# Patient Record
Sex: Female | Born: 1945
Health system: Southern US, Community
[De-identification: ages and names within clinical notes are randomized; demographics above are authoritative.]

## PROBLEM LIST (undated history)

## (undated) DIAGNOSIS — M545 Low back pain, unspecified: Secondary | ICD-10-CM

## (undated) DIAGNOSIS — K219 Gastro-esophageal reflux disease without esophagitis: Secondary | ICD-10-CM

## (undated) DIAGNOSIS — I5189 Other ill-defined heart diseases: Secondary | ICD-10-CM

## (undated) DIAGNOSIS — M1990 Unspecified osteoarthritis, unspecified site: Secondary | ICD-10-CM

## (undated) DIAGNOSIS — E039 Hypothyroidism, unspecified: Secondary | ICD-10-CM

## (undated) DIAGNOSIS — N390 Urinary tract infection, site not specified: Secondary | ICD-10-CM

## (undated) DIAGNOSIS — G8929 Other chronic pain: Secondary | ICD-10-CM

## (undated) DIAGNOSIS — F419 Anxiety disorder, unspecified: Secondary | ICD-10-CM

## (undated) DIAGNOSIS — I10 Essential (primary) hypertension: Secondary | ICD-10-CM

## (undated) DIAGNOSIS — M719 Bursopathy, unspecified: Secondary | ICD-10-CM

## (undated) DIAGNOSIS — Z8639 Personal history of other endocrine, nutritional and metabolic disease: Secondary | ICD-10-CM

## (undated) DIAGNOSIS — E785 Hyperlipidemia, unspecified: Secondary | ICD-10-CM

## (undated) DIAGNOSIS — I739 Peripheral vascular disease, unspecified: Secondary | ICD-10-CM

## (undated) DIAGNOSIS — L4050 Arthropathic psoriasis, unspecified: Secondary | ICD-10-CM

## (undated) DIAGNOSIS — N159 Renal tubulo-interstitial disease, unspecified: Secondary | ICD-10-CM

## (undated) DIAGNOSIS — M797 Fibromyalgia: Secondary | ICD-10-CM

## (undated) DIAGNOSIS — D649 Anemia, unspecified: Secondary | ICD-10-CM

## (undated) DIAGNOSIS — K589 Irritable bowel syndrome without diarrhea: Secondary | ICD-10-CM

## (undated) DIAGNOSIS — H9193 Unspecified hearing loss, bilateral: Secondary | ICD-10-CM

## (undated) DIAGNOSIS — Z9889 Other specified postprocedural states: Secondary | ICD-10-CM

## (undated) DIAGNOSIS — E538 Deficiency of other specified B group vitamins: Secondary | ICD-10-CM

## (undated) HISTORY — DX: Deficiency of other specified B group vitamins: E53.8

## (undated) HISTORY — PX: CHOLECYSTECTOMY: SHX55

## (undated) HISTORY — DX: Bursopathy, unspecified: M71.9

## (undated) HISTORY — DX: Hyperlipidemia, unspecified: E78.5

## (undated) HISTORY — DX: Gastro-esophageal reflux disease without esophagitis: K21.9

## (undated) HISTORY — DX: Low back pain: M54.5

## (undated) HISTORY — DX: Anxiety disorder, unspecified: F41.9

## (undated) HISTORY — DX: Essential (primary) hypertension: I10

## (undated) HISTORY — DX: Other specified postprocedural states: Z98.890

## (undated) HISTORY — DX: Low back pain, unspecified: M54.50

## (undated) HISTORY — DX: Peripheral vascular disease, unspecified: I73.9

## (undated) HISTORY — DX: Anemia, unspecified: D64.9

## (undated) HISTORY — PX: OTHER SURGICAL HISTORY: SHX169

## (undated) HISTORY — DX: Renal tubulo-interstitial disease, unspecified: N15.9

## (undated) HISTORY — DX: Hypothyroidism, unspecified: E03.9

## (undated) HISTORY — DX: Urinary tract infection, site not specified: N39.0

## (undated) HISTORY — DX: Other chronic pain: G89.29

## (undated) HISTORY — DX: Unspecified osteoarthritis, unspecified site: M19.90

## (undated) HISTORY — DX: Arthropathic psoriasis, unspecified: L40.50

## (undated) HISTORY — DX: Irritable bowel syndrome, unspecified: K58.9

---

## 1996-04-08 HISTORY — PX: NECK SURGERY: SHX720

## 1997-11-24 ENCOUNTER — Emergency Department (HOSPITAL_COMMUNITY): Admission: EM | Admit: 1997-11-24 | Discharge: 1997-11-24 | Payer: Self-pay | Admitting: Emergency Medicine

## 1997-12-13 ENCOUNTER — Ambulatory Visit (HOSPITAL_COMMUNITY): Admission: RE | Admit: 1997-12-13 | Discharge: 1997-12-13 | Payer: Self-pay | Admitting: Gastroenterology

## 1998-11-16 ENCOUNTER — Encounter: Payer: Self-pay | Admitting: *Deleted

## 1998-11-16 ENCOUNTER — Ambulatory Visit (HOSPITAL_COMMUNITY): Admission: RE | Admit: 1998-11-16 | Discharge: 1998-11-16 | Payer: Self-pay | Admitting: *Deleted

## 1999-09-13 ENCOUNTER — Other Ambulatory Visit: Admission: RE | Admit: 1999-09-13 | Discharge: 1999-09-13 | Payer: Self-pay | Admitting: Obstetrics and Gynecology

## 2000-09-08 ENCOUNTER — Encounter: Payer: Self-pay | Admitting: General Surgery

## 2000-09-10 ENCOUNTER — Encounter (INDEPENDENT_AMBULATORY_CARE_PROVIDER_SITE_OTHER): Payer: Self-pay | Admitting: Specialist

## 2000-09-10 ENCOUNTER — Observation Stay (HOSPITAL_COMMUNITY): Admission: RE | Admit: 2000-09-10 | Discharge: 2000-09-11 | Payer: Self-pay | Admitting: General Surgery

## 2000-10-30 ENCOUNTER — Other Ambulatory Visit: Admission: RE | Admit: 2000-10-30 | Discharge: 2000-10-30 | Payer: Self-pay | Admitting: Obstetrics and Gynecology

## 2001-07-07 ENCOUNTER — Encounter: Payer: Self-pay | Admitting: Gastroenterology

## 2001-07-07 ENCOUNTER — Ambulatory Visit (HOSPITAL_COMMUNITY): Admission: RE | Admit: 2001-07-07 | Discharge: 2001-07-07 | Payer: Self-pay | Admitting: Gastroenterology

## 2001-08-05 ENCOUNTER — Ambulatory Visit (HOSPITAL_COMMUNITY): Admission: RE | Admit: 2001-08-05 | Discharge: 2001-08-05 | Payer: Self-pay | Admitting: Oncology

## 2001-08-05 ENCOUNTER — Encounter: Payer: Self-pay | Admitting: Oncology

## 2001-09-30 ENCOUNTER — Encounter: Admission: RE | Admit: 2001-09-30 | Discharge: 2001-09-30 | Payer: Self-pay | Admitting: Family Medicine

## 2001-09-30 ENCOUNTER — Encounter: Payer: Self-pay | Admitting: Family Medicine

## 2001-11-18 ENCOUNTER — Other Ambulatory Visit: Admission: RE | Admit: 2001-11-18 | Discharge: 2001-11-18 | Payer: Self-pay | Admitting: *Deleted

## 2002-03-10 ENCOUNTER — Ambulatory Visit (HOSPITAL_COMMUNITY): Admission: RE | Admit: 2002-03-10 | Discharge: 2002-03-10 | Payer: Self-pay | Admitting: Cardiovascular Disease

## 2002-03-10 ENCOUNTER — Encounter: Payer: Self-pay | Admitting: Cardiovascular Disease

## 2002-03-17 ENCOUNTER — Encounter: Payer: Self-pay | Admitting: Rheumatology

## 2002-03-17 ENCOUNTER — Encounter: Admission: RE | Admit: 2002-03-17 | Discharge: 2002-03-17 | Payer: Self-pay | Admitting: Rheumatology

## 2002-05-19 ENCOUNTER — Ambulatory Visit (HOSPITAL_COMMUNITY): Admission: RE | Admit: 2002-05-19 | Discharge: 2002-05-19 | Payer: Self-pay | Admitting: Gastroenterology

## 2002-09-21 ENCOUNTER — Encounter: Payer: Self-pay | Admitting: Gastroenterology

## 2002-09-21 ENCOUNTER — Ambulatory Visit (HOSPITAL_COMMUNITY): Admission: RE | Admit: 2002-09-21 | Discharge: 2002-09-21 | Payer: Self-pay | Admitting: Gastroenterology

## 2002-10-14 ENCOUNTER — Encounter: Admission: RE | Admit: 2002-10-14 | Discharge: 2002-11-03 | Payer: Self-pay | Admitting: Family Medicine

## 2003-07-05 ENCOUNTER — Observation Stay (HOSPITAL_COMMUNITY): Admission: AD | Admit: 2003-07-05 | Discharge: 2003-07-07 | Payer: Self-pay | Admitting: Family Medicine

## 2003-10-18 ENCOUNTER — Emergency Department (HOSPITAL_COMMUNITY): Admission: EM | Admit: 2003-10-18 | Discharge: 2003-10-18 | Payer: Self-pay | Admitting: Emergency Medicine

## 2004-04-08 LAB — HM MAMMOGRAPHY: HM Mammogram: NORMAL

## 2004-04-08 LAB — CONVERTED CEMR LAB: Pap Smear: NORMAL

## 2004-04-25 ENCOUNTER — Ambulatory Visit: Payer: Self-pay | Admitting: Family Medicine

## 2004-05-04 ENCOUNTER — Ambulatory Visit: Payer: Self-pay

## 2004-05-11 ENCOUNTER — Ambulatory Visit: Payer: Self-pay | Admitting: Critical Care Medicine

## 2004-05-14 ENCOUNTER — Ambulatory Visit: Payer: Self-pay

## 2004-06-05 ENCOUNTER — Ambulatory Visit: Payer: Self-pay | Admitting: Family Medicine

## 2004-06-29 ENCOUNTER — Ambulatory Visit: Payer: Self-pay | Admitting: Family Medicine

## 2004-07-16 ENCOUNTER — Ambulatory Visit: Payer: Self-pay | Admitting: Family Medicine

## 2004-07-19 ENCOUNTER — Ambulatory Visit (HOSPITAL_COMMUNITY): Admission: RE | Admit: 2004-07-19 | Discharge: 2004-07-19 | Payer: Self-pay | Admitting: Family Medicine

## 2004-11-23 ENCOUNTER — Ambulatory Visit: Payer: Self-pay | Admitting: Family Medicine

## 2005-11-15 ENCOUNTER — Ambulatory Visit: Payer: Self-pay | Admitting: Internal Medicine

## 2006-03-25 ENCOUNTER — Ambulatory Visit: Payer: Self-pay | Admitting: Internal Medicine

## 2006-03-25 LAB — CONVERTED CEMR LAB: TSH: 0.49 microintl units/mL (ref 0.35–5.50)

## 2006-04-13 ENCOUNTER — Encounter: Payer: Self-pay | Admitting: Internal Medicine

## 2006-12-04 ENCOUNTER — Telehealth (INDEPENDENT_AMBULATORY_CARE_PROVIDER_SITE_OTHER): Payer: Self-pay | Admitting: *Deleted

## 2006-12-04 ENCOUNTER — Encounter (INDEPENDENT_AMBULATORY_CARE_PROVIDER_SITE_OTHER): Payer: Self-pay | Admitting: *Deleted

## 2007-01-15 ENCOUNTER — Encounter (INDEPENDENT_AMBULATORY_CARE_PROVIDER_SITE_OTHER): Payer: Self-pay | Admitting: *Deleted

## 2007-01-27 ENCOUNTER — Ambulatory Visit: Payer: Self-pay | Admitting: Internal Medicine

## 2007-01-27 DIAGNOSIS — J989 Respiratory disorder, unspecified: Secondary | ICD-10-CM | POA: Insufficient documentation

## 2007-01-27 DIAGNOSIS — E079 Disorder of thyroid, unspecified: Secondary | ICD-10-CM | POA: Insufficient documentation

## 2007-01-27 DIAGNOSIS — M549 Dorsalgia, unspecified: Secondary | ICD-10-CM

## 2007-01-27 DIAGNOSIS — K589 Irritable bowel syndrome without diarrhea: Secondary | ICD-10-CM | POA: Insufficient documentation

## 2007-01-27 DIAGNOSIS — I10 Essential (primary) hypertension: Secondary | ICD-10-CM | POA: Insufficient documentation

## 2007-01-27 DIAGNOSIS — R0989 Other specified symptoms and signs involving the circulatory and respiratory systems: Secondary | ICD-10-CM

## 2007-01-27 DIAGNOSIS — K219 Gastro-esophageal reflux disease without esophagitis: Secondary | ICD-10-CM | POA: Insufficient documentation

## 2007-02-03 LAB — CONVERTED CEMR LAB
Folate: 8.8 ng/mL
Sed Rate: 34 mm/hr — ABNORMAL HIGH (ref 0–25)
TSH: 23.76 microintl units/mL — ABNORMAL HIGH (ref 0.35–5.50)
Vitamin B-12: 205 pg/mL — ABNORMAL LOW (ref 211–911)

## 2007-02-05 ENCOUNTER — Ambulatory Visit: Payer: Self-pay | Admitting: Internal Medicine

## 2007-02-13 ENCOUNTER — Ambulatory Visit: Payer: Self-pay | Admitting: Internal Medicine

## 2007-02-26 ENCOUNTER — Ambulatory Visit: Payer: Self-pay | Admitting: Internal Medicine

## 2007-03-02 ENCOUNTER — Telehealth (INDEPENDENT_AMBULATORY_CARE_PROVIDER_SITE_OTHER): Payer: Self-pay | Admitting: *Deleted

## 2007-03-09 ENCOUNTER — Telehealth (INDEPENDENT_AMBULATORY_CARE_PROVIDER_SITE_OTHER): Payer: Self-pay | Admitting: *Deleted

## 2007-03-12 ENCOUNTER — Ambulatory Visit: Payer: Self-pay | Admitting: Internal Medicine

## 2007-04-15 ENCOUNTER — Ambulatory Visit: Payer: Self-pay | Admitting: Internal Medicine

## 2007-04-15 DIAGNOSIS — R002 Palpitations: Secondary | ICD-10-CM | POA: Insufficient documentation

## 2007-04-15 LAB — CONVERTED CEMR LAB
Bilirubin Urine: NEGATIVE
Blood in Urine, dipstick: NEGATIVE
Glucose, Urine, Semiquant: NEGATIVE
Ketones, urine, test strip: NEGATIVE
Nitrite: NEGATIVE
Protein, U semiquant: NEGATIVE
Specific Gravity, Urine: >=1.03
Urobilinogen, UA: NEGATIVE
WBC Urine, dipstick: NEGATIVE
pH: 5

## 2007-04-20 LAB — CONVERTED CEMR LAB
ALT: 20 units/L (ref 0–35)
AST: 20 units/L (ref 0–37)
Albumin: 4.2 g/dL (ref 3.5–5.2)
Alkaline Phosphatase: 98 units/L (ref 39–117)
BUN: 13 mg/dL (ref 6–23)
Basophils Absolute: 0.2 10*3/uL — ABNORMAL HIGH (ref 0.0–0.1)
Basophils Relative: 1.6 % — ABNORMAL HIGH (ref 0.0–1.0)
Bilirubin, Direct: 0.1 mg/dL (ref 0.0–0.3)
CO2: 27 meq/L (ref 19–32)
Calcium: 9.6 mg/dL (ref 8.4–10.5)
Chloride: 104 meq/L (ref 96–112)
Cholesterol: 244 mg/dL (ref 0–200)
Creatinine, Ser: 1 mg/dL (ref 0.4–1.2)
Direct LDL: 169.8 mg/dL
Eosinophils Absolute: 0.4 10*3/uL (ref 0.0–0.6)
Eosinophils Relative: 3.1 % (ref 0.0–5.0)
Folate: 9.6 ng/mL
GFR calc Af Amer: 72 mL/min
GFR calc non Af Amer: 60 mL/min
Glucose, Bld: 112 mg/dL — ABNORMAL HIGH (ref 70–99)
HCT: 39.1 % (ref 36.0–46.0)
HDL: 34.5 mg/dL — ABNORMAL LOW (ref >39.0)
Hemoglobin: 13.4 g/dL (ref 12.0–15.0)
Lymphocytes Relative: 33.4 % (ref 12.0–46.0)
MCHC: 34.4 g/dL (ref 30.0–36.0)
MCV: 88 fL (ref 78.0–100.0)
Monocytes Absolute: 0.8 10*3/uL — ABNORMAL HIGH (ref 0.2–0.7)
Monocytes Relative: 6.9 % (ref 3.0–11.0)
Neutro Abs: 6.3 10*3/uL (ref 1.4–7.7)
Neutrophils Relative %: 55 % (ref 43.0–77.0)
Platelets: 422 10*3/uL — ABNORMAL HIGH (ref 150–400)
Potassium: 4.7 meq/L (ref 3.5–5.1)
RBC: 4.44 M/uL (ref 3.87–5.11)
RDW: 13.3 % (ref 11.5–14.6)
Sodium: 141 meq/L (ref 135–145)
TSH: 7.21 microintl units/mL — ABNORMAL HIGH (ref 0.35–5.50)
Total Bilirubin: 0.7 mg/dL (ref 0.3–1.2)
Total CHOL/HDL Ratio: 7.1
Total Protein: 7.5 g/dL (ref 6.0–8.3)
Triglycerides: 214 mg/dL (ref 0–149)
VLDL: 43 mg/dL — ABNORMAL HIGH (ref 0–40)
Vitamin B-12: >1500 pg/mL — ABNORMAL HIGH (ref 211–911)
WBC: 11.6 10*3/uL — ABNORMAL HIGH (ref 4.5–10.5)

## 2007-04-21 ENCOUNTER — Ambulatory Visit: Payer: Self-pay

## 2007-04-21 ENCOUNTER — Encounter: Payer: Self-pay | Admitting: Internal Medicine

## 2007-04-28 ENCOUNTER — Encounter (INDEPENDENT_AMBULATORY_CARE_PROVIDER_SITE_OTHER): Payer: Self-pay | Admitting: *Deleted

## 2007-06-01 ENCOUNTER — Telehealth (INDEPENDENT_AMBULATORY_CARE_PROVIDER_SITE_OTHER): Payer: Self-pay | Admitting: *Deleted

## 2007-10-20 ENCOUNTER — Observation Stay (HOSPITAL_COMMUNITY): Admission: EM | Admit: 2007-10-20 | Discharge: 2007-10-23 | Payer: Self-pay | Admitting: Emergency Medicine

## 2007-10-21 ENCOUNTER — Ambulatory Visit: Payer: Self-pay | Admitting: Internal Medicine

## 2007-10-21 ENCOUNTER — Ambulatory Visit: Payer: Self-pay | Admitting: Cardiology

## 2007-10-22 ENCOUNTER — Ambulatory Visit: Admission: RE | Admit: 2007-10-22 | Discharge: 2007-10-22 | Payer: Self-pay | Admitting: Internal Medicine

## 2007-11-30 ENCOUNTER — Ambulatory Visit: Payer: Self-pay | Admitting: Internal Medicine

## 2007-11-30 DIAGNOSIS — Z9889 Other specified postprocedural states: Secondary | ICD-10-CM

## 2007-11-30 DIAGNOSIS — R0602 Shortness of breath: Secondary | ICD-10-CM | POA: Insufficient documentation

## 2007-11-30 DIAGNOSIS — I2510 Atherosclerotic heart disease of native coronary artery without angina pectoris: Secondary | ICD-10-CM | POA: Insufficient documentation

## 2007-11-30 DIAGNOSIS — E785 Hyperlipidemia, unspecified: Secondary | ICD-10-CM | POA: Insufficient documentation

## 2007-11-30 HISTORY — DX: Other specified postprocedural states: Z98.890

## 2008-04-04 ENCOUNTER — Emergency Department (HOSPITAL_COMMUNITY): Admission: EM | Admit: 2008-04-04 | Discharge: 2008-04-04 | Payer: Self-pay | Admitting: Family Medicine

## 2008-09-06 ENCOUNTER — Encounter (INDEPENDENT_AMBULATORY_CARE_PROVIDER_SITE_OTHER): Payer: Self-pay | Admitting: *Deleted

## 2008-09-12 ENCOUNTER — Telehealth (INDEPENDENT_AMBULATORY_CARE_PROVIDER_SITE_OTHER): Payer: Self-pay | Admitting: *Deleted

## 2008-11-08 ENCOUNTER — Ambulatory Visit: Payer: Self-pay | Admitting: Internal Medicine

## 2008-11-08 LAB — CONVERTED CEMR LAB
Glucose, Urine, Semiquant: NEGATIVE
Nitrite: NEGATIVE
Protein, U semiquant: 100
Specific Gravity, Urine: 1.025
Urobilinogen, UA: 0.2
pH: 5

## 2008-11-09 ENCOUNTER — Ambulatory Visit: Payer: Self-pay | Admitting: Internal Medicine

## 2008-11-10 ENCOUNTER — Encounter: Payer: Self-pay | Admitting: Internal Medicine

## 2008-11-10 LAB — CONVERTED CEMR LAB

## 2008-11-11 ENCOUNTER — Telehealth (INDEPENDENT_AMBULATORY_CARE_PROVIDER_SITE_OTHER): Payer: Self-pay | Admitting: *Deleted

## 2008-11-21 ENCOUNTER — Telehealth (INDEPENDENT_AMBULATORY_CARE_PROVIDER_SITE_OTHER): Payer: Self-pay | Admitting: *Deleted

## 2008-11-21 LAB — CONVERTED CEMR LAB
ALT: 16 units/L (ref 0–35)
AST: 14 units/L (ref 0–37)
BUN: 14 mg/dL (ref 6–23)
CO2: 30 meq/L (ref 19–32)
Calcium: 9.1 mg/dL (ref 8.4–10.5)
Chloride: 110 meq/L (ref 96–112)
Cholesterol: 175 mg/dL (ref 0–200)
Creatinine, Ser: 0.9 mg/dL (ref 0.4–1.2)
GFR calc non Af Amer: 67.11 mL/min (ref >60)
Glucose, Bld: 111 mg/dL — ABNORMAL HIGH (ref 70–99)
HDL: 36.5 mg/dL — ABNORMAL LOW (ref >39.00)
LDL Cholesterol: 100 mg/dL — ABNORMAL HIGH (ref 0–99)
Potassium: 3.6 meq/L (ref 3.5–5.1)
Sodium: 144 meq/L (ref 135–145)
TSH: 0.61 microintl units/mL (ref 0.35–5.50)
Total CHOL/HDL Ratio: 5
Triglycerides: 194 mg/dL — ABNORMAL HIGH (ref 0.0–149.0)
VLDL: 38.8 mg/dL (ref 0.0–40.0)

## 2008-12-01 ENCOUNTER — Encounter (INDEPENDENT_AMBULATORY_CARE_PROVIDER_SITE_OTHER): Payer: Self-pay | Admitting: *Deleted

## 2009-07-12 ENCOUNTER — Telehealth (INDEPENDENT_AMBULATORY_CARE_PROVIDER_SITE_OTHER): Payer: Self-pay | Admitting: *Deleted

## 2009-07-18 ENCOUNTER — Encounter (INDEPENDENT_AMBULATORY_CARE_PROVIDER_SITE_OTHER): Payer: Self-pay | Admitting: *Deleted

## 2009-09-23 ENCOUNTER — Emergency Department (HOSPITAL_COMMUNITY): Admission: EM | Admit: 2009-09-23 | Discharge: 2009-09-23 | Payer: Self-pay | Admitting: Emergency Medicine

## 2009-10-19 ENCOUNTER — Ambulatory Visit: Payer: Self-pay | Admitting: Internal Medicine

## 2009-10-26 ENCOUNTER — Encounter (INDEPENDENT_AMBULATORY_CARE_PROVIDER_SITE_OTHER): Payer: Self-pay | Admitting: *Deleted

## 2009-11-15 ENCOUNTER — Encounter (INDEPENDENT_AMBULATORY_CARE_PROVIDER_SITE_OTHER): Payer: Self-pay | Admitting: *Deleted

## 2009-11-20 ENCOUNTER — Telehealth (INDEPENDENT_AMBULATORY_CARE_PROVIDER_SITE_OTHER): Payer: Self-pay | Admitting: *Deleted

## 2009-12-21 ENCOUNTER — Telehealth: Payer: Self-pay | Admitting: Internal Medicine

## 2010-02-08 ENCOUNTER — Telehealth (INDEPENDENT_AMBULATORY_CARE_PROVIDER_SITE_OTHER): Payer: Self-pay | Admitting: *Deleted

## 2010-02-14 ENCOUNTER — Encounter (INDEPENDENT_AMBULATORY_CARE_PROVIDER_SITE_OTHER): Payer: Self-pay | Admitting: *Deleted

## 2010-02-26 ENCOUNTER — Telehealth: Payer: Self-pay | Admitting: Internal Medicine

## 2010-03-15 ENCOUNTER — Encounter: Payer: Self-pay | Admitting: Internal Medicine

## 2010-03-15 ENCOUNTER — Ambulatory Visit: Payer: Self-pay | Admitting: Internal Medicine

## 2010-03-15 LAB — CONVERTED CEMR LAB
ALT: 13 units/L (ref 0–35)
AST: 17 units/L (ref 0–37)
Basophils Absolute: 0.1 10*3/uL (ref 0.0–0.1)
Basophils Relative: 0.5 % (ref 0.0–3.0)
Cholesterol: 185 mg/dL (ref 0–200)
Eosinophils Absolute: 0.4 10*3/uL (ref 0.0–0.7)
Eosinophils Relative: 3.2 % (ref 0.0–5.0)
HCT: 39.2 % (ref 36.0–46.0)
HDL: 35.4 mg/dL — ABNORMAL LOW (ref >39.00)
Hemoglobin: 12.8 g/dL (ref 12.0–15.0)
LDL Cholesterol: 112 mg/dL — ABNORMAL HIGH (ref 0–99)
Lymphocytes Relative: 41.1 % (ref 12.0–46.0)
Lymphs Abs: 5.3 10*3/uL — ABNORMAL HIGH (ref 0.7–4.0)
MCHC: 32.6 g/dL (ref 30.0–36.0)
MCV: 91.1 fL (ref 78.0–100.0)
Monocytes Absolute: 1 10*3/uL (ref 0.1–1.0)
Monocytes Relative: 7.9 % (ref 3.0–12.0)
Neutro Abs: 6.1 10*3/uL (ref 1.4–7.7)
Neutrophils Relative %: 47.3 % (ref 43.0–77.0)
Platelets: 449 10*3/uL — ABNORMAL HIGH (ref 150.0–400.0)
RBC: 4.31 M/uL (ref 3.87–5.11)
RDW: 16.4 % — ABNORMAL HIGH (ref 11.5–14.6)
TSH: 0.87 microintl units/mL (ref 0.35–5.50)
Total CHOL/HDL Ratio: 5
Triglycerides: 187 mg/dL — ABNORMAL HIGH (ref 0.0–149.0)
VLDL: 37.4 mg/dL (ref 0.0–40.0)
Vit D, 25-Hydroxy: 12 ng/mL — ABNORMAL LOW
Vitamin B-12: 48 pg/mL — ABNORMAL LOW
WBC: 12.8 10*3/uL — ABNORMAL HIGH (ref 4.5–10.5)

## 2010-03-19 ENCOUNTER — Telehealth: Payer: Self-pay | Admitting: Internal Medicine

## 2010-03-22 ENCOUNTER — Telehealth: Payer: Self-pay | Admitting: Internal Medicine

## 2010-03-22 ENCOUNTER — Encounter: Payer: Self-pay | Admitting: Internal Medicine

## 2010-03-23 ENCOUNTER — Telehealth: Payer: Self-pay | Admitting: Internal Medicine

## 2010-03-26 ENCOUNTER — Encounter (INDEPENDENT_AMBULATORY_CARE_PROVIDER_SITE_OTHER): Payer: Self-pay | Admitting: *Deleted

## 2010-03-26 ENCOUNTER — Encounter: Payer: Self-pay | Admitting: Internal Medicine

## 2010-03-27 ENCOUNTER — Emergency Department (HOSPITAL_COMMUNITY)
Admission: EM | Admit: 2010-03-27 | Discharge: 2010-03-28 | Payer: Self-pay | Source: Home / Self Care | Admitting: Emergency Medicine

## 2010-04-05 ENCOUNTER — Emergency Department (HOSPITAL_COMMUNITY)
Admission: EM | Admit: 2010-04-05 | Discharge: 2010-04-05 | Payer: Self-pay | Source: Home / Self Care | Admitting: Emergency Medicine

## 2010-04-28 ENCOUNTER — Encounter: Payer: Self-pay | Admitting: Family Medicine

## 2010-04-29 ENCOUNTER — Encounter: Payer: Self-pay | Admitting: Family Medicine

## 2010-05-02 ENCOUNTER — Ambulatory Visit: Admit: 2010-05-02 | Payer: Self-pay | Admitting: Internal Medicine

## 2010-05-02 ENCOUNTER — Telehealth: Payer: Self-pay | Admitting: Internal Medicine

## 2010-05-07 ENCOUNTER — Telehealth: Payer: Self-pay | Admitting: Internal Medicine

## 2010-05-08 NOTE — Progress Notes (Signed)
Summary: decrease simvastatin to 10 mg   Phone Note Outgoing Call   Summary of Call: please advise patient to take Zocor 20 mg only half tablet a day because she is on Cardizem and they may  interact. Otherwise followup here as planned [facility] E. Paz MD  November 20, 2009 8:46 AM   Follow-up for Phone Call        Tried calling pt, mailbox is full. Army Fossa CMA  November 20, 2009 9:29 AM  Tried calling pt, mailbox is full. Army Fossa CMA  November 20, 2009 1:33 PM  Tried calling pt, mailbox's are full.Harold Barban  November 21, 2009 10:13 AM   Additional Follow-up for Phone Call Additional follow up Details #1::        send a letter [facility] E. Paz MD  November 21, 2009 2:21 PM     Additional Follow-up for Phone Call Additional follow up Details #2::    Letter mailed.  Follow-up by: Harold Barban,  November 21, 2009 3:13 PM

## 2010-05-08 NOTE — Letter (Signed)
Summary: Primary Care Appointment Letter  [facility]  [address] [place], [place] [address]   Phone: [phone]  Fax: [phone]    07/18/2009 MRN: [id number]  Kayla [name] [address] [place], [place]  [address]  Dear [name],   Your Primary Care Physician Nolon Rod. Paz MD has indicated that:    __x_____it is time to schedule an appointment.  Please call our office @ [phone] to schedule an office visit with Dr. Drue Novel.    Thank you,    [facility] Primary Care Scheduler

## 2010-05-08 NOTE — Progress Notes (Signed)
Summary: lab results  ---- Converted from flag ---- ---- 11/10/2008 2:07 PM, Jose E. Paz MD wrote: can we add a hemoglobin A1c  unable to add -- no purple tube drawn lmom for pt to return call Shary Decamp  November 11, 2008 10:58 AM  patient left message on VM -- returning my call -- requested that I leave detailed message on her answering machine Left message on machine for pt to call & schedule additional lab work [facility]  November 14, 2008 5:05 PM

## 2010-05-08 NOTE — Assessment & Plan Note (Signed)
Summary: cpx//tl    Vital Signs:  Patient Profile:   65 Years Old Female Height:     65 inches Weight:      228.4 pounds BMI:     38.15 Pulse rate:   86 / minute BP sitting:   120 / 80  Vitals Entered By: Shary Decamp (April 15, 2007 9:34 AM)                 Chief Complaint:  cpx - fasting; c/o low back pain.  History of Present Illness: CPX  UTI?  LBP-chronic but in the last two days has gone "'up to the kidney area"  Current Allergies (reviewed today): * [employer]  Past Medical History:    Reviewed history from 01/27/2007 and no changes required:       Asthma       GERD       Hypertension       Hypothyroidism       chronic LBP       B12 def       IBS  Past Surgical History:    neck surgery 1998    Cholecystectomy   Family History:    MI-- father 50 y/o    colon ca--no    breast ca--no    DM--no  Social History:    Married    2 kids   Risk Factors:  Tobacco use:  never Alcohol use:  no  Mammogram History:     Date of Last Mammogram:  04/08/2004    Results:  normal (per pt)   PAP Smear History:     Date of Last PAP Smear:  04/08/2004    Results:  normal (per pt)    Review of Systems  General      other than below, ROS  is negative  no exercise or diet  CV      Denies swelling of feet.      occ palpitation at nigh: no CP, no SOB, no N-V, no diaphoresis. last few seconds  Resp      Denies cough.  GI      Denies bloody stools.      occ abd dyscomfort (h/o IBS  GU      Denies dysuria, hematuria, urinary frequency, and urinary hesitancy.  Psych      doing okay emotionally, "better than in long time, going to church" husband--ill, h/o ETOH   Physical Exam  General:     alert, well-developed, and overweight-appearing.   Neck:     no masses and no thyromegaly.   Lungs:     normal respiratory effort, no intercostal retractions, no accessory muscle use, and normal breath sounds.   Heart:     normal rate,  regular rhythm, no murmur, and no gallop.   Abdomen:     soft, non-tender, no hepatomegaly, and no splenomegaly.   Extremities:     no edema Psych:     Oriented X3, memory intact for recent and remote, normally interactive, good eye contact, not anxious appearing, and not depressed appearing.      Impression & Recommendations:  Problem # 1:  HEALTH SCREENING (ICD-V70.0) Female care per gyn H/o at least 2  Cscopes, last 2-04: Nl also had a EGD 2004: HH and a  esoph. striture labs diet!  (can't exercise much due to LBP) Orders: UA Dipstick w/o Micro (81002) Venipuncture (04540) TLB-Lipid Panel (80061-LIPID) TLB-B12 + Folate Pnl (98119_14782-N56/OZH) TLB-BMP (Basic Metabolic Panel-BMET) (80048-METABOL) TLB-CBC  Platelet - w/Differential (85025-CBCD) TLB-Hepatic/Liver Function Pnl (80076-HEPATIC) TLB-TSH (Thyroid Stimulating Hormone) (84443-TSH)   Problem # 2:  PALPITATIONS, OCCASIONAL (ICD-785.1) palpitation w/o CP EKG : no acute, incomplete RBBB? Rec: ECHO Orders: EKG w/ Interpretation (93000) Cardiology Referral (Cardiology)   Complete Medication List: 1)  Synthroid 200 Mcg Tabs (Levothyroxine sodium) .... Take one tablet once daliy 2)  Cardizem La 360 Mg Tb24 (Diltiazem hcl coated beads) .Marland Kitchen.. 1 by mouth once daily due office visit 3)  Cytomel 25 Mcg Tabs (Liothyronine sodium) .... 1/2 by mouth qd 4)  Fluoxetine Hcl 40 Mg Caps (Fluoxetine hcl) .Marland Kitchen.. 1 by mouth qd  Other Orders: Vit B12 1000 mcg (J3420) Admin of Therapeutic Inj  intramuscular or subcutaneous (11914)   Patient Instructions: 1)  Please schedule a follow-up appointment in 3 to 4  months.    ]  Tetanus/Td Immunization History:    Tetanus/Td # 1:  historical (per pt) (04/09/2003)  Pneumovax Immunization History:    Pneumovax # 1:  Historical (04/25/2004)   Laboratory Results   Urine Tests    Routine Urinalysis   Glucose: negative   (Normal Range: Negative) Bilirubin: negative   (Normal  Range: Negative) Ketone: negative   (Normal Range: Negative) Spec. Gravity: >=1.030   (Normal Range: 1.003-1.035) Blood: negative   (Normal Range: Negative) pH: 5.0   (Normal Range: 5.0-8.0) Protein: negative   (Normal Range: Negative) Urobilinogen: negative   (Normal Range: 0-1) Nitrite: negative   (Normal Range: Negative) Leukocyte Esterace: negative   (Normal Range: Negative)         Preventive Care Screening  Mammogram:    Date:  04/08/2004    Results:  normal (per pt)   Pap Smear:    Date:  04/08/2004    Results:  normal (per pt)   Last Tetanus Booster:    Date:  04/09/2003    Results:  historical (per pt)       Medication Administration  Injection # 1:    Medication: Vit B12 1000 mcg    Diagnosis: B12 DEFICIENCY (ICD-266.2)    Route: IM    Site: R deltoid    Exp Date: 06/2008    Lot #: [id number]    Patient tolerated injection without complications    Given by: Shary Decamp (April 15, 2007 10:28 AM)  Orders Added: 1)  UA Dipstick w/o Micro [81002] 2)  Venipuncture [56213] 3)  TLB-Lipid Panel [80061-LIPID] 4)  TLB-B12 + Folate Pnl [82746_82607-B12/FOL] 5)  TLB-BMP (Basic Metabolic Panel-BMET) [80048-METABOL] 6)  TLB-CBC Platelet - w/Differential [85025-CBCD] 7)  TLB-Hepatic/Liver Function Pnl [80076-HEPATIC] 8)  TLB-TSH (Thyroid Stimulating Hormone) [84443-TSH] 9)  EKG w/ Interpretation [93000] 10)  Vit B12 1000 mcg [J3420] 11)  Admin of Therapeutic Inj  intramuscular or subcutaneous [90772] 12)  Est. Patient 40-64 years [08657] 64)  Cardiology Referral [Cardiology]

## 2010-05-08 NOTE — Assessment & Plan Note (Signed)
Summary: CPX---/FASTING/KN   Vital Signs:  Patient profile:   65 year old female Height:      65 inches Weight:      231 pounds BMI:     38.58 Pulse rate:   88 / minute Pulse rhythm:   regular BP sitting:   138 / 80  (left arm) Cuff size:   large  Vitals Entered By: Army Fossa CMA (October 19, 2009 8:47 AM) CC: Pt here for f/u: fasting Comments - would like ambien   History of Present Illness: CPX chart is reviewed Does see a gynecologist, and she is due for a visit  In addition to her CPX we address the following issues  --COPD-asthma? 2 months history of some difficulty breathing when she goes up the stairs along with throat congestion Review of systems: She does have some itchy eyes and itchy nose, occasional cough and wheezing. Denies fever or sputum production very rarely has GERD symptoms  --still unable to sleep, temazepam makes her too sleepy the next day. She still likes to rule out sleep apnea because she snores loudly   --hypertension-- no recent ambulatory BPs   --Hypothyroidism-- off med x 1 week  --Hyperlipidemia-- off meds x 1 week   --she was also seen at the emergency room September 23, 2009, she was diagnosed with a UTI Chart is reviewed CBC show white count of 10, hemoglobin 12.7, platelets 408 Potassium 3.6, creatinine 0.9 Single CK -mb negative Urinalysis showed many WBCs but the urine culture was negative EKG was unchanged from previous She was prescribed ciprofloxacin and Diflucan  Allergies: 1)  * [employer]  Past History:  Past Medical History: Asthma GERD Hypertension Hypothyroidism chronic LBP B12 def IBS CAD---10-2007: abnormal stress test and nonobstructive coronary artery disease per cardiac catheterization performed on October 22, 2007, per Dr. Tonny Bollman.  Hyperlipidemia.  The patient refusing statin Hyperlipidemia  Past Surgical History: Reviewed history from 04/15/2007 and no changes required. neck surgery  1998 Cholecystectomy  Family History: MI-- father 88 y/o lung ca-- uncle,non smoker leukemia-- uncle  colon ca--no breast ca--no DM--no  Social History: Reviewed history from 11/08/2008 and no changes required. Married--lost husband 2009 mom had a head trauma 2010 2 kids tobbaco--no ETOH--no diet-- poor exercise--active   Review of Systems CV:  Denies chest pain or discomfort, palpitations, and swelling of feet. Resp:  Denies coughing up blood. GI:  Denies bloody stools, diarrhea, nausea, and vomiting. GU:  Denies dysuria and hematuria. Psych:  veru busy but emotionally ok .  Physical Exam  General:  alert, well-developed, and overweight-appearing.   Ears:  R ear normal and L ear normal.   Nose:  not congested Mouth:  no rectal discharge Lungs:  normal respiratory effort, no intercostal retractions, no accessory muscle use, and slightly decreasedl breath sounds.   Heart:  normal rate, regular rhythm, no murmur, and no gallop.   Abdomen:  soft, non-tender, no distention, no masses, no guarding, and no rigidity.   Extremities:  no pitting edema bilaterally Psych:  Cognition and judgment appear intact. Alert and cooperative with normal attention span and concentration.     Impression & Recommendations:  Problem # 1:  HEALTH SCREENING (ICD-V70.0) Td 05 pneumonia shot--06  Female care per gyn, strongly encouraged to make an appointment, get a breast exam and a mammogram H/o at least 2  Cscopes, last 2-04: nl ; no actual records, will call [facility]   discussed diet and exercise labs  Problem # 2:  ASTHMA (ICD-493.90) see description of symptoms at  the history of present illness plan: Claritin, symbicort  Her updated medication list for this problem includes:    Symbicort 80-4.5 Mcg/act Aero (Budesonide-formoterol fumarate) .Marland Kitchen... 2 puffs twice a day  Problem # 3:  HYPERLIPIDEMIA (ICD-272.4) restart meds, labs in a month Her updated medication list for this  problem includes:    Simvastatin 20 Mg Tabs (Simvastatin) .Marland Kitchen... 1 by mouth at bedtime  Labs Reviewed: SGOT: 14 (11/09/2008)   SGPT: 16 (11/09/2008)   HDL:36.50 (11/09/2008), 34.5 (04/15/2007)  LDL:100 (11/09/2008), DEL (04/15/2007)  Chol:175 (11/09/2008), 244 (04/15/2007)  Trig:194.0 (11/09/2008), 214 (04/15/2007)  Problem # 4:  B12 DEFICIENCY (ICD-266.2) labs  Problem # 5:  HYPOTHYROIDISM (ICD-244.9) due for labs, refill medicines Her updated medication list for this problem includes:    Synthroid 125 Mcg Tabs (Levothyroxine sodium) .Marland Kitchen... 2 by mouth once daily  Labs Reviewed: TSH: 0.61 (11/09/2008)    Chol: 175 (11/09/2008)   HDL: 36.50 (11/09/2008)   LDL: 100 (11/09/2008)   TG: 194.0 (11/09/2008)  Problem # 6:  HYPERTENSION (ICD-401.9) restart medicines, labs Her updated medication list for this problem includes:    Cardizem La 360 Mg Tb24 (Diltiazem hcl coated beads) .Marland Kitchen... 1 by mouth once daily  BP today: 138/80 Prior BP: 120/88 (11/08/2008)  Labs Reviewed: K+: 3.6 (11/09/2008) Creat: : 0.9 (11/09/2008)   Chol: 175 (11/09/2008)   HDL: 36.50 (11/09/2008)   LDL: 100 (11/09/2008)   TG: 194.0 (11/09/2008)  Problem # 7:  GERD (ICD-530.81) barely symptomatic  Problem # 8:  ? of SLEEP APNEA, OBSTRUCTIVE (ICD-327.23)  referral per patient request  Orders: Pulmonary Referral (Pulmonary)  Complete Medication List: 1)  Synthroid 125 Mcg Tabs (Levothyroxine sodium) .... 2 by mouth once daily 2)  Cardizem La 360 Mg Tb24 (Diltiazem hcl coated beads) .Marland Kitchen.. 1 by mouth once daily 3)  Fluoxetine Hcl 40 Mg Caps (Fluoxetine hcl) .Marland Kitchen.. 1 by mouth qd 4)  Simvastatin 20 Mg Tabs (Simvastatin) .Marland Kitchen.. 1 by mouth at bedtime 5)  Symbicort 80-4.5 Mcg/act Aero (Budesonide-formoterol fumarate) .... 2 puffs twice a day 6)  Ambien 10 Mg Tabs (Zolpidem tartrate) .Marland Kitchen.. 1 by mouth at bedtime as needed insomnia  Patient Instructions: 1)  restart all your  medicines 2)  Claritin over-the-counter daily 3)   Start symbicort  4)  Back fasting in one month 5)  vitamin d, AST, ALT,  CBC--- dx v70 6)  FLP---dx  hyperlipidemia 7)  TSH---dx hypothyroidism 8)  B 12 level-- dx of B12 deficiency 9)  Please schedule a follow-up appointment in 3  months .  Prescriptions: AMBIEN 10 MG TABS (ZOLPIDEM TARTRATE) 1 by mouth at bedtime as needed insomnia  #30 x 1   Entered and Authorized by:   Elita Quick E. Illeana Edick MD   Signed by:   Nolon Rod. Rebeka Kimble MD on 10/19/2009   Method used:   Print then Give to Patient   RxID:   [id number] SYMBICORT 80-4.5 MCG/ACT AERO (BUDESONIDE-FORMOTEROL FUMARATE) 2 puffs twice a day  #1 x 6   Entered and Authorized by:   Nolon Rod. Adaline Trejos MD   Signed by:   Nolon Rod. Jamaia Brum MD on 10/19/2009   Method used:   Print then Give to Patient   RxID:   [id number] SIMVASTATIN 20 MG TABS (SIMVASTATIN) 1 by mouth at bedtime  #30 x 6   Entered and Authorized by:   Nolon Rod. Melquan Ernsberger MD   Signed by:   Nolon Rod. Jamaurion Slemmer MD on 10/19/2009  Method used:   Print then Give to Patient   RxID:   [id number] FLUOXETINE HCL 40 MG  CAPS (FLUOXETINE HCL) 1 by mouth qd  #30 Capsule x 6   Entered and Authorized by:   Nolon Rod. Rejoice Heatwole MD   Signed by:   Nolon Rod. Gabreil Yonkers MD on 10/19/2009   Method used:   Print then Give to Patient   RxID:   [id number] CARDIZEM LA 360 MG  TB24 (DILTIAZEM HCL COATED BEADS) 1 by mouth once daily  #30 x 6   Entered and Authorized by:   Elita Quick E. Larna Capelle MD   Signed by:   Nolon Rod. Avaleen Brownley MD on 10/19/2009   Method used:   Print then Give to Patient   RxID:   [id number] SYNTHROID 125 MCG  TABS (LEVOTHYROXINE SODIUM) 2 by mouth once daily  #60 x 6   Entered and Authorized by:   Nolon Rod. Raelea Gosse MD   Signed by:   Nolon Rod. Gary Gabrielsen MD on 10/19/2009   Method used:   Print then Give to Patient   RxID:   [id number]   Appended Document: CPX---/FASTING/KN got records from GI, we got a colonoscopy from  "Rhea Pink, same-day of birth and medical record number; manometer confirmed that is the same as "LAVERNE HURSEY" Had a colonoscopy with Dr. Jarold Motto 05-19-2002 She was recommended to have another colonoscopy in 2009 We'll contact GI  Appended Document: CPX---/FASTING/KN Spoke with GI they state pt can just call and schedule to have a colonoscopy or we can put in a referral for pt.   Appended Document: CPX---/FASTING/KN please put a referal  Appended Document: Orders Update     Clinical Lists Changes  Orders: Added new Referral order of Gastroenterology Referral (GI) - Signed

## 2010-05-08 NOTE — Progress Notes (Signed)
Summary: PAZ--REFILL-due ov  Medications Added SYNTHROID 175 MCG TABS (LEVOTHYROXINE SODIUM) Take 1 tablet by mouth once a day HYDROCODONE-ACETAMINOPHEN 5-500 MG TABS (HYDROCODONE-ACETAMINOPHEN) Take 1 tablet by mouth four times a day CARDIZEM LA 360 MG  TB24 (DILTIAZEM HCL COATED BEADS) 1 by mouth once daily due office visit      Allergies Added: * [employer] Phone Note Refill Request   Refills Requested: Medication #1:  CARDIVEM LA 360 MG [facility] PT IS LEAVING TO GO OUT OF TOWN TOMORROW  Initial call taken by: Freddy Jaksch,  December 04, 2006 12:42 PM   New Allergies: * [employer] New/Updated Medications: SYNTHROID 175 MCG TABS (LEVOTHYROXINE SODIUM) Take 1 tablet by mouth once a day HYDROCODONE-ACETAMINOPHEN 5-500 MG TABS (HYDROCODONE-ACETAMINOPHEN) Take 1 tablet by mouth four times a day CARDIZEM LA 360 MG  TB24 (DILTIAZEM HCL COATED BEADS) 1 by mouth once daily due office visit New Allergies: * [employer]  Prescriptions: CARDIZEM LA 360 MG  TB24 (DILTIAZEM HCL COATED BEADS) 1 by mouth once daily due office visit  #30 x 1   Entered by:   Shary Decamp   Authorized by:   Nolon Rod. Paz MD   Signed by:   Shary Decamp on 12/04/2006   Method used:   Electronically sent to ...       [facility]  [address] [phone]*       [address]       [place], [place]  [address]       Ph: [phone] or [phone]       Fax: [phone]   RxID:   [id number]    due office visit letter mailed ..................................................................Marland KitchenShary Decamp  December 04, 2006 1:03 PM

## 2010-05-08 NOTE — Assessment & Plan Note (Signed)
Summary: fu on meds/kdc   Vital Signs:  Patient profile:   65 year old female Height:      65 inches Weight:      220.4 pounds BMI:     36.81 Temp:     97.7 degrees F oral Pulse rate:   94 / minute Pulse rhythm:   regular BP sitting:   120 / 88  (left arm) Cuff size:   large  Vitals Entered By: Shary Decamp (November 08, 2008 4:11 PM) CC: rov Comments  - c/o of urinary sxs Kayla [name]  November 08, 2008 4:14 PM    History of Present Illness: ROV here after almost 1 year, state she couldn't came d/t several issues (lost husband, lost a house to fire, mom ill) -- hypothyroidism, report good medication compliance  --high cholesterol, states is taking statins as Rx, no s/e that she can tell , occasionally aches but that is not something new   - c/o of urinary sxs: frecuency ,lower abd dyscomfort x 1 week   Current Medications (verified): 1)  Synthroid 125 Mcg  Tabs (Levothyroxine Sodium) .... 2 By Mouth Once Daily 2)  Cardizem La 360 Mg  Tb24 (Diltiazem Hcl Coated Beads) .Marland Kitchen.. 1 By Mouth Once Daily 3)  Fluoxetine Hcl 40 Mg  Caps (Fluoxetine Hcl) .Marland Kitchen.. 1 By Mouth Qd 4)  Simvastatin 20 Mg Tabs (Simvastatin) .Marland Kitchen.. 1 By Mouth At Bedtime  Allergies (verified): 1)  * [employer]  Past History:  Past Medical History: Reviewed history from 11/30/2007 and no changes required. Asthma GERD Hypertension Hypothyroidism chronic LBP B12 def IBS CAD---10-2007: abnormal stress test and nonobstructive coronary artery disease per cardiac catheterization performed on October 22, 2007, per Dr. Tonny Bollman.  Hyperlipidemia.  The patient refusing statin Hyperlipidemia  Past Surgical History: Reviewed history from 04/15/2007 and no changes required. neck surgery 1998 Cholecystectomy  Social History: [name] husband 2009 mom had a head trauma 2010 2 kids tobbaco--no ETOH--no  Review of Systems General:  Denies fever; low chol diet? no, admits to poor diet . CV:  Denies  chest pain or discomfort; SOB x years, at baseline  . Resp:  reports poor sleeps (non-restorative) when asked, reports on-off fatigue and snoring  ambulatory BPs : checks seldom but usually ok . GI:  no GERD symptoms . GU:  Denies hematuria; has chronic back pain . Psych:  sleeps poorly her mood has been ok despite all the problems she had over the last year .  Physical Exam  General:  alert, well-developed, and overweight-appearing.   Lungs:  normal respiratory effort, no intercostal retractions, no accessory muscle use, and normal breath sounds.   Heart:  normal rate, regular rhythm, no murmur, and no gallop.   Abdomen:  soft, non-tender, no distention, and no masses.   Extremities:  no pretibial edema bilaterally    Impression & Recommendations:  Problem # 1:  UTI (ICD-599.0) Assessment New see Udip, UCX pending  start cipro, push fluids   Her updated medication list for this problem includes:    Ciprofloxacin Hcl 500 Mg Tabs (Ciprofloxacin hcl) .Marland Kitchen... 1 by mouth  two times a day  Problem # 2:  DYSPNEA (ICD-786.05) chronic, at baseline   Problem # 3:  HYPERLIPIDEMIA (ICD-272.4)  labs  Her updated medication list for this problem includes:    Simvastatin 20 Mg Tabs (Simvastatin) .Marland Kitchen... 1 by mouth at bedtime  Labs Reviewed: SGOT: 20 (04/15/2007)   SGPT: 20 (04/15/2007)   HDL:34.5 (04/15/2007)  LDL:DEL (04/15/2007)  Chol:244 (04/15/2007)  Trig:214 (04/15/2007)  Problem # 4:  HYPOTHYROIDISM (ICD-244.9) labs , reports good medication compliance  Her updated medication list for this problem includes:    Synthroid 125 Mcg Tabs (Levothyroxine sodium) .Marland Kitchen... 2 by mouth once daily  Problem # 5:  HYPERTENSION (ICD-401.9) at goal  Her updated medication list for this problem includes:    Cardizem La 360 Mg Tb24 (Diltiazem hcl coated beads) .Marland Kitchen... 1 by mouth once daily  BP today: 120/88 Prior BP: 138/80 (11/30/2007)  Labs Reviewed: K+: 4.7 (04/15/2007) Creat: : 1.0  (04/15/2007)   Chol: 244 (04/15/2007)   HDL: 34.5 (04/15/2007)   LDL: DEL (04/15/2007)   TG: 214 (04/15/2007)  Problem # 6:  ? of SLEEP APNEA, OBSTRUCTIVE (ICD-327.23)  see ROS offered pulmonary referal : will do (needs to be after September) asked for something to help her in the meantime: Rx restoril as needed   Orders: Pulmonary Referral (Pulmonary)  Problem # 7:  pt w/  multiple medical problems  reminded she needs to be seen every 4 to 6 months   Complete Medication List: 1)  Synthroid 125 Mcg Tabs (Levothyroxine sodium) .... 2 by mouth once daily 2)  Cardizem La 360 Mg Tb24 (Diltiazem hcl coated beads) .Marland Kitchen.. 1 by mouth once daily 3)  Fluoxetine Hcl 40 Mg Caps (Fluoxetine hcl) .Marland Kitchen.. 1 by mouth qd 4)  Simvastatin 20 Mg Tabs (Simvastatin) .Marland Kitchen.. 1 by mouth at bedtime 5)  Ciprofloxacin Hcl 500 Mg Tabs (Ciprofloxacin hcl) .Marland Kitchen.. 1 by mouth  two times a day 6)  Temazepam 7.5 Mg Caps (Temazepam) .Marland Kitchen.. 1  at night as needed for insomnia  Other Orders: UA Dipstick w/o Micro (manual) (21308) Specimen Handling (99000) T-Urine Microscopic (65784-69629) T-Culture, Urine (52841-32440)  Patient Instructions: 1)  came back fasting: 2)  FLP AST ALT  Dx high chol 3)  TSH  Dx hypothyroidism 4)  BMP  dx HTN  5)  Please schedule a follow-up appointment in 4 months .  Prescriptions: TEMAZEPAM 7.5 MG CAPS (TEMAZEPAM) 1  at night as needed for insomnia  #30 x 0   Entered and Authorized by:   Nolon Rod. Steffani Dionisio MD   Signed by:   Nolon Rod. Delno Blaisdell MD on 11/08/2008   Method used:   Print then Give to Patient   RxID:   [id number] CIPROFLOXACIN HCL 500 MG TABS (CIPROFLOXACIN HCL) 1 by mouth  two times a day  #14 x 0   Entered and Authorized by:   Nolon Rod. Aneri Slagel MD   Signed by:   Nolon Rod. Cheyanne Lamison MD on 11/08/2008   Method used:   Print then Give to Patient   RxID:   [id number]   Laboratory Results   Urine Tests    Routine Urinalysis   Color: dark yellow Appearance: Clear Glucose: negative   (Normal  Range: Negative) Bilirubin: large   (Normal Range: Negative) Ketone: moderate (40)   (Normal Range: Negative) Spec. Gravity: 1.025   (Normal Range: 1.003-1.035) Blood: moderate   (Normal Range: Negative) pH: 5.0   (Normal Range: 5.0-8.0) Protein: 100   (Normal Range: Negative) Urobilinogen: 0.2   (Normal Range: 0-1) Nitrite: negative   (Normal Range: Negative) Leukocyte Esterace: trace   (Normal Range: Negative)

## 2010-05-08 NOTE — Progress Notes (Signed)
Summary: lab results - letter mailed  Phone Note Outgoing Call Call back at [facility] Phone [phone]   Summary of Call: LAB RESULTS: advise patient: (or left a message) chol ok, continue  same meds , TG  slightly  elevated ----> watch her diet  needs A1C  Signed by Elita Quick E. Paz MD on 11/17/2008 at 11:44 AM  Follow-up for Phone Call        [facility] for pt to return call Shary Decamp  November 21, 2008 4:00 PM  [facility] for pt to return call Shary Decamp  November 29, 2008 1:16 PM  patient never returned call -- will mail letter & copy of labs to pt  [facility]  November 30, 2008 11:09 AM

## 2010-05-08 NOTE — Letter (Signed)
Summary: Results Follow up Letter  [facility] [place]  [address] [place], [place] [address]   Phone: [phone]  Fax: [phone]    04/28/2007 MRN: [id number]  Kayla [name] [address] [place], [place]  [address]  Dear [name],  The following are the results of your recent test(s):  Test         Result    Pap Smear:        Normal _____  Not Normal _____ Comments: ______________________________________________________ Cholesterol: LDL(Bad cholesterol):         Your goal is less than:         HDL (Good cholesterol):       Your goal is more than: Comments:  ______________________________________________________ Mammogram:        Normal _____  Not Normal _____ Comments:  ___________________________________________________________________ Hemoccult:        Normal _____  Not normal _______ Comments:    _____________________________________________________________________ Other Tests:  ECHO - NORMAL   We routinely do not discuss normal results over the telephone.  If you desire a copy of the results, or you have any questions about this information we can discuss them at your next office visit.   Sincerely,

## 2010-05-08 NOTE — Letter (Signed)
Summary: Results Follow up Letter  [facility] [place]  [address] [place] [address] [place], [place] [address]   Phone: [phone]  Fax: [phone]    12/01/2008 MRN: [id number]  BRINNA DIVELBISS [address] [place], [place]  [address]  Dear [name],  The following are the results of your recent test(s):  Test         Result    Pap Smear:        Normal _____  Not Normal _____ Comments: ______________________________________________________ Cholesterol: LDL(Bad cholesterol):         Your goal is less than:         HDL (Good cholesterol):       Your goal is more than: Comments:  ______________________________________________________ Mammogram:        Normal _____  Not Normal _____ Comments:  ___________________________________________________________________ Hemoccult:        Normal _____  Not normal _______ Comments:    _____________________________________________________________________ Other Tests:   I have been trying to contact you re: lab work.  Your cholesterol looks ok.  Your triglycerides are elevated.  Please watch your diet & exercise.  Continue all your current medications.  We still need to recheck your sugar.  Please call our office to schedule a lab appointment for an a1c. Call me if you have any questions or concerns. Alena Bills [phone] ext [phone]

## 2010-05-08 NOTE — Letter (Signed)
Summary: Pre Visit No Show Letter  [facility]  [address] [place], [place] [address]   Phone: [phone]  Fax: [phone]        November 15, 2009 MRN: [id number]    Kayla [name] [address] [place], [place]  [address]    Dear [name],   We have been unable to reach you by phone concerning the pre-procedure visit that you missed on 11/06/2009. For this reason,your procedure scheduled on 11/29/2009 has been cancelled. Our scheduling staff will gladly assist you with rescheduling your appointments at a more convenient time. Please call our office at [phone] between the hours of 8:00am and 5:00pm, press option #2 to reach an appointment scheduler. Please consider updating your contact numbers at this time so that we can reach you by phone in the future with schedule changes or results.    Thank you,    Ezra Sites RN [facility]

## 2010-05-08 NOTE — Letter (Signed)
Summary: Primary Care Appointment Letter  [facility] [place]  [address] [place], [place] [address]   Phone: [phone]  Fax: [phone]    02/14/2010 MRN: [id number]  Kayla [name] [address] [place], [place]  [address]  Dear [name],   Your Primary Care Physician Nolon Rod. Paz MD has indicated that:    _______it is time to schedule an appointment.    _______you missed your appointment on______ and need to call and          reschedule.    ___XXX_you need to have lab work done. Please call us to schedule this fasting lab and tell the person scheduling your appointment about the following labs:   vitamin d, AST, ALT,  CBC--- dx v70  FLP---dx  hyperlipidemia TSH---dx hypothyroidism  B 12 level-- dx of B12 deficiency    _______you need to schedule an appointment discuss lab or test results.    _______you need to call to reschedule your appointment that is                       scheduled on _________.     Please call our office as soon as possible. Our phone number is [phone]. Our office is open 8a-12noon and 1p-5p, Monday through Friday.     Thank you,    Sarah at extension [phone]  [facility]

## 2010-05-08 NOTE — Progress Notes (Signed)
Summary: refill -pt returned call didnt sched lab (lmom 11/23)   Phone Note Refill Request Message from:  Fax from Pharmacy on February 26, 2010 9:30 AM  Refills Requested: Medication #1:  AMBIEN 10 MG TABS 1 by mouth at bedtime as needed insomnia.   Last Refilled: 12/21/2009   Notes[facility] [address] [facility] fax [phone] Jani Files [phone]   Initial call taken by: Okey Regal Spring,  February 26, 2010 9:31 AM  Follow-up for Phone Call        see last phone note no further RF w/o labs [place] E. Paz MD  February 26, 2010 4:51 PM   Additional Follow-up for Phone Call Additional follow up Details #1::        unable to leave mailbox, full. Additional Follow-up by: Army Fossa CMA,  February 26, 2010 4:54 PM    Additional Follow-up for Phone Call Additional follow up Details #2::    Unable to leave message, mailbox is full, will mail letter to have her contact our office.Harold Barban  February 27, 2010 9:41 AM  patient going out of town to care for her mother doesnt know how long she will  be gone may be a week - she will schedule lab when she gets back - can med be refilled  patient can be reached at [phone] .Marland KitchenOkey Regal Spring  February 27, 2010 10:47 AM  CALL #15, NO RF Jose E. Paz MD  February 27, 2010 4:36 PM      Additional Follow-up for Phone Call Additional follow up Details #3:: Details for Additional Follow-up Action Taken: Left message for pt to call back. Army Fossa CMA  February 28, 2010 8:28 AM   Prescriptions: AMBIEN 10 MG TABS (ZOLPIDEM TARTRATE) 1 by mouth at bedtime as needed insomnia  #15 x 0   Entered by:   Army Fossa CMA   Authorized by:   Nolon Rod. Paz MD   Signed by:   Army Fossa CMA on 02/28/2010   Method used:   Printed then faxed to ...       [facility]  [address] [phone][facility] (retail)       [address]       [place], [place]  [id number]       Ph: [phone] or [phone]       Fax: [phone]   RxID:    [id number]

## 2010-05-08 NOTE — Assessment & Plan Note (Signed)
Summary: FOR MED REFILL--PH   Vital Signs:  Patient Profile:   65 Years Old Female Height:     65 inches Weight:      228.6 pounds Pulse rate:   80 / minute BP sitting:   138 / 80  Vitals Entered By: Shary Decamp (November 30, 2007 3:14 PM)                 Chief Complaint:  ROV -- PT WAS IN [facility] 7/09 FOR CHEST PAIN; AFTER PT WAS DISCHARGED FROM THE HOSPITAL SHE REALIZED THAT SHE WAS SUPPOSED TO BE TAKING 2 TABLETS -- SHE HAD ONLY BEEN TAKING ONE.  History of Present Illness: f/u hospital admission PT WAS IN [facility] 7/09 FOR CHEST PAIN     DATE OF ADMISSION:  10/20/2007   DATE OF DISCHARGE:  10/23/2007      DISCHARGE DIAGNOSES:   1. Chest pain with abnormal stress test and nonobstructive coronary       artery disease per cardiac catheterization performed on October 22, 2007, per Dr. Tonny Bollman.   2. Hypothyroid with very elevated thyroid-stimulating hormone during       this admission, Synthroid increased with plans for outpatient       thyroid function test in 4-6 weeks.   3. Hyperlipidemia.  The patient refusing statin, instructed on low-       cholesterol diet.    CT of the chest (-) for PE 10-22-07 TSH 150 10-21-07: TC 281, LDL 207, HDL 30 10-20-07: Cr 1.2     Updated Prior Medication List: SYNTHROID 125 MCG  TABS (LEVOTHYROXINE SODIUM) 2 by mouth qd CARDIZEM LA 360 MG  TB24 (DILTIAZEM HCL COATED BEADS) 1 by mouth once daily due office visit FLUOXETINE HCL 40 MG  CAPS (FLUOXETINE HCL) 1 by mouth qd  Current Allergies (reviewed today): * ACE INHIBITORS GROUP  Past Medical History:    Asthma    GERD    Hypertension    Hypothyroidism    chronic LBP    B12 def    IBS    CAD---10-2007: abnormal stress test and nonobstructive coronary artery disease per cardiac catheterization performed on October 22, 2007, per Dr. Tonny Bollman.     Hyperlipidemia.  The patient refusing statin    Hyperlipidemia     Review of Systems  General      hoarseness on-off, and   SOB "like is a effort to catch my breath": admits to throat congestion and PN drip a lot ?wheezing (maybe 2 times) admits to some reflux  CV      no furhter CP admits tonot dieting not exercise either , states is b/c her back  pain  Endo      she took one 125mg  synthroid [facility] at [facility], now taking two   Physical Exam  General:     alert and overweight-appearing.   Lungs:     normal respiratory effort, no intercostal retractions, no accessory muscle use, and normal breath sounds.   Heart:     normal rate, regular rhythm, and no murmur.   Abdomen:     soft, non-tender, no hepatomegaly, and no splenomegaly.   Extremities:     no pretibial edema bilaterally  Psych:     not anxious appearing and not depressed appearing.      Impression & Recommendations:  Problem # 1:  CORONARY ARTERY DISEASE (ICD-414.00) non-obstructive per cath 7-09 goal: CV risk factors  control   Her  updated medication list for this problem includes:    Cardizem La 360 Mg Tb24 (Diltiazem hcl coated beads) .Marland Kitchen... 1 by mouth once daily due office visit  10-22-07 TSH 150 10-21-07: TC 281, LDL 207, HDL 30 10-20-07: Cr 1.2  Problem # 2:  HYPERLIPIDEMIA (ICD-272.4) risks and benefits of statins d/w patient no h/o increased LFTs, last LFTs 1-09 start simvastatin see instructions   10-22-07 TSH 150 10-21-07: TC 281, LDL 207, HDL 30 10-20-07: Cr 1.2 Her updated medication list for this problem includes:    Simvastatin 20 Mg Tabs (Simvastatin) .Marland Kitchen... 1 by mouth at bedtime   Problem # 3:  HYPOTHYROIDISM (ICD-244.9) currently on of synthroid, previously only on  The following medications were removed from the medication list:    Cytomel 25 Mcg Tabs (Liothyronine sodium) .Marland Kitchen... 1/2 by mouth qd  Her updated medication list for this problem includes:    Synthroid 125 Mcg Tabs (Levothyroxine sodium) .Marland Kitchen... 2 by mouth qd  10-22-07 TSH 150 10-21-07: TC 281, LDL 207, HDL 30 10-20-07: Cr 1.2  Problem # 4:   DYSPNEA (ICD-786.05) see ROS has hoarseness and ill defined SOB plan: treat GERD w/ prilosec and AR w/ flonase  Problem # 5:  GERD (ICD-530.81) start PPIs Her updated medication list for this problem includes:    Prilosec Otc 20 Mg Tbec (Omeprazole magnesium) .Marland Kitchen... 1 before breakfast   Complete Medication List: 1)  Synthroid 125 Mcg Tabs (Levothyroxine sodium) .... 2 by mouth qd 2)  Cardizem La 360 Mg Tb24 (Diltiazem hcl coated beads) .Marland Kitchen.. 1 by mouth once daily due office visit 3)  Fluoxetine Hcl 40 Mg Caps (Fluoxetine hcl) .Marland Kitchen.. 1 by mouth qd 4)  Prilosec Otc 20 Mg Tbec (Omeprazole magnesium) .Marland Kitchen.. 1 before breakfast 5)  Flonase 50 Mcg/act Susp (Fluticasone propionate) .... 2 sprays on each nostril before bed time 6)  Simvastatin 20 Mg Tabs (Simvastatin) .Marland Kitchen.. 1 by mouth at bedtime   Patient Instructions: 1)  came back fasting in 3 weeks FASTING for a FLP  LFTs and TSH 2)  Dx cholesterol and hypothyroidism 3)  Please schedule a follow-up appointment in 2 months.   Prescriptions: SYNTHROID 125 MCG  TABS (LEVOTHYROXINE SODIUM) 2 by mouth qd  #60 x 3   Entered and Authorized by:   Nolon Rod. Paz MD   Signed by:   Nolon Rod. Paz MD on 11/30/2007   Method used:   Print then Give to Patient   RxID:   [id number] SIMVASTATIN 20 MG TABS (SIMVASTATIN) 1 by mouth at bedtime  #30 x 3   Entered and Authorized by:   Nolon Rod. Paz MD   Signed by:   Nolon Rod. Paz MD on 11/30/2007   Method used:   Print then Give to Patient   RxID:   [id number] FLONASE 50 MCG/ACT SUSP (FLUTICASONE PROPIONATE) 2 sprays on each nostril before bed time  #1 x 6   Entered and Authorized by:   Nolon Rod. Paz MD   Signed by:   Nolon Rod. Paz MD on 11/30/2007   Method used:   Print then Give to Patient   RxID:   [id number]  ]

## 2010-05-08 NOTE — Progress Notes (Signed)
Summary: cpx sched 03/26/07  ---- Converted from flag ---- ---- 03/02/2007 2:48 PM, Charolette Child wrote: 12.18.08@8 :00 will come in fasting  ---- 03/02/2007 10:07 AM, Charolette Child wrote: lmtcb   ---- 03/02/2007 9:20 AM, Shary Decamp wrote: I REFILLED HER THYROID MED - BUT SHE IS DUE FOR CPX ------------------------------

## 2010-05-08 NOTE — Assessment & Plan Note (Signed)
Summary: EAR ACHE AND B-12 SHOT//VGJ   Vital Signs:  Patient Profile:   65 Years Old Female Weight:      228 pounds Temp:     98.4 degrees F oral BP sitting:   130 / 80  Vitals Entered By: Shary Decamp (February 13, 2007 3:38 PM)                 Chief Complaint:  left pain/pressure x last pm.  History of Present Illness: here for B12 but also has  L otalgia x 1 day and likes to be seen  Current Allergies: * ACE INHIBITORS GROUP     Review of Systems       some sinus congestion no F was hoarse 2 days ago some PN drip   Physical Exam  General:     alert.   Head:     face symetric some tenderness at the L TMJ Ears:     Nl B no d/c no red, canal nl Nose:     no congested Mouth:     No red, no d/c, teeth no tender to percussion    Impression & Recommendations:  Problem # 1:  EAR PAIN, LEFT (ICD-388.70) no evidence of infex Early URI? TMJ? OTC benadryl or Zyrtec, Tylenol, call if worses  Complete Medication List: 1)  Synthroid 200 Mcg Tabs (Levothyroxine sodium) .... Take one tablet once daliy 2)  Cardizem La 360 Mg Tb24 (Diltiazem hcl coated beads) .Marland Kitchen.. 1 by mouth once daily due office visit 3)  Cytomel 25 Mcg Tabs (Liothyronine sodium) .... 1/2 by mouth qd  Other Orders: Vit B12 1000 mcg (J3420) Admin of Therapeutic Inj  intramuscular or subcutaneous ([address])     ]  Medication Administration  Injection # 1:    Medication: Vit B12 1000 mcg    Diagnosis: ANEMIA NOS (ICD-285.9)    Route: IM    Site: R deltoid    Exp Date: 02/2008    Lot #: [id number]    Patient tolerated injection without complications    Given by: Shary Decamp (February 13, 2007 3:41 PM)  Orders Added: 1)  Vit B12 1000 mcg [J3420] 2)  Admin of Therapeutic Inj  intramuscular or subcutaneous [90772] 3)  Est. Patient Level II [40981]

## 2010-05-08 NOTE — Progress Notes (Signed)
Summary: Kayla [name]  Phone Note Call from Patient Call back at [facility] Phone [phone]   Caller: Patient Summary of Call: PT CALLED AND LEFT MSG NEED RX FOR CARDIZEM SHE SAYS GOT RX BURNED AT [facility] , ([facility] [address] [facility]) I CALLED AND SPOKE WITH PHARMACIST THERE. THEY HAVE RX LEFT [facility], ALSO DUE FOR OV PLEASE SCHEDULE Initial call taken by: Kandice Hams,  September 12, 2008 4:55 PM

## 2010-05-08 NOTE — Letter (Signed)
Summary: [facility]  [facility]   Imported By: Freddy Jaksch 03/03/2007 17:13:19  _____________________________________________________________________  External Attachment:    Type:   Image     Comment:   External Document

## 2010-05-08 NOTE — Progress Notes (Signed)
Summary: ok refill, no further w/o labs-- needs lab--mailed letter 11-9  Phone Note Refill Request Call back at [phone] daughter  Message from:  Kayla [name] on February 08, 2010 3:17 PM  Refills Requested: Medication #1:  SYNTHROID 125 MCG  TABS 2 by mouth once [facility] [address] -- Kayla [name] is going out of town needs at least 10 days supply to get her thru - does she need ov or just lab if lab need lab order i  Initial call taken by: Okey Regal Spring,  February 08, 2010 3:20 PM  Follow-up for Phone Call        Pt had CPX in july- when do yo u want to recheck her TSH?  Follow-up by: Army Fossa CMA,  February 08, 2010 3:29 PM  Additional Follow-up for Phone Call Additional follow up Details #1::        OK TO CALL #30, NO rf NEEDS FASTING  LABS AS FOLLOWS (ORDERED FEW MONTHS AGO):  vitamin d, AST, ALT,  CBC--- dx v70  FLP---dx  hyperlipidemia TSH---dx hypothyroidism  B 12 level-- dx of B12 deficiency  Jose E. Paz MD  February 09, 2010 10:14 AM       Additional Follow-up for Phone Call Additional follow up Details #2::    unable to leave message on either phone number--mail boxes full on both--will mail letter.Jerolyn Shin  February 14, 2010 10:42 AM  Prescriptions: SYNTHROID 125 MCG  TABS (LEVOTHYROXINE SODIUM) 2 by mouth once daily  #60 Tablet x 0   Entered by:   Army Fossa CMA   Authorized by:   Nolon Rod. Paz MD   Signed by:   Army Fossa CMA on 02/08/2010   Method used:   Electronically to        [facility] [phone][facility] (retail)       [address]       [place], [place]  [id number]       Ph: [phone] or [phone]       Fax: [phone]   RxID:   [id number]

## 2010-05-08 NOTE — Progress Notes (Signed)
Summary: refill  Phone Note Refill Request Message from:  Fax from Pharmacy on December 21, 2009 11:12 AM  Refills Requested: Medication #1:  AMBIEN 10 MG TABS 1 by mouth at bedtime as needed insomnia. [facility] [address] - fax  [phone]  Initial call taken by: Okey Regal Spring,  December 21, 2009 11:13 AM  Follow-up for Phone Call        last refilled 10/19/09 #30 x 1. Army Fossa CMA  December 21, 2009 11:16 AM   Additional Follow-up for Phone Call Additional follow up Details #1::        ok #30, 1 RF Jose E. Paz MD  December 21, 2009 1:20 PM     Prescriptions: AMBIEN 10 MG TABS (ZOLPIDEM TARTRATE) 1 by mouth at bedtime as needed insomnia  #30 x 1   Entered by:   Army Fossa CMA   Authorized by:   Nolon Rod. Paz MD   Signed by:   Army Fossa CMA on 12/21/2009   Method used:   Printed then faxed to ...       [facility]  [address] [phone]* (retail)       [address]       [place], [place]  [id number]       Ph: [phone] or [phone]       Fax: [phone]   RxID:   [id number]

## 2010-05-08 NOTE — Letter (Signed)
Summary: Generic Letter  [facility] [place]  [address] [place], [place] [address]   Phone: [phone]  Fax: [phone]    12/04/2006  Kayla [name] [address] [place], [place]  [address]   Dear [name],   You are due for an office visit with Dr. Drue Novel.  Please call our office @ [phone] to schedule an appointment.         Sincerely,   Shary Decamp [place] [facility]

## 2010-05-08 NOTE — Letter (Signed)
Summary: Previsit letter  [facility]  [address] [place], [place] [address]   Phone: [phone]  Fax: [phone]       10/26/2009 MRN: [id number]  Kayla [name] [address] [place], [place]  [address]  Dear [name],  Welcome to the Gastroenterology Division at [facility].    You are scheduled to see a nurse for your pre-procedure visit on November 15, 2009 at 2:30pm on the 3rd floor at [facility], [address]  We ask that you try to arrive at our office 15 minutes prior to your appointment time to allow for check-in.  Your nurse visit will consist of discussing your medical and surgical history, your immediate family medical history, and your medications.    Please bring a complete list of all your medications or, if you prefer, bring the medication bottles and we will list them.  We will need to be aware of both prescribed and over the counter drugs.  We will need to know exact dosage information as well.  If you are on blood thinners (Coumadin, Plavix, Aggrenox, Ticlid, etc.) please call our office today/prior to your appointment, as we need to consult with your physician about holding your medication.   Please be prepared to read and sign documents such as consent forms, a financial agreement, and acknowledgement forms.  If necessary, and with your consent, a friend or relative is welcome to sit-in on the nurse visit with you.  Please bring your insurance card so that we may make a copy of it.  If your insurance requires a referral to see a specialist, please bring your referral form from your primary care physician.  No co-pay is required for this nurse visit.     If you cannot keep your appointment, please call [phone] to cancel or reschedule prior to your appointment date.  This allows [place] the opportunity to schedule an appointment for another patient in need of care.    Thank you for choosing [facility] for your medical  needs.  We appreciate the opportunity to care for you.  Please visit [place] at our website  to learn more about our practice.                     Sincerely.                                                                                                                   The Gastroenterology Division

## 2010-05-08 NOTE — Progress Notes (Signed)
Summary: due tsh   Phone Note Outgoing Call   Details for Reason: NEEDS REPEAT TSH DX 244.9 ..................................................................Marland KitchenShary Decamp  June 01, 2007 3:04 PM   Follow-up for Phone Call        pt is taking classes at [employer] during the day. she is going to contact the office in a couple of weeks to schedule this appointment with the lab ..................................................................Marland KitchenCharolette Child  June 03, 2007 10:47 AM

## 2010-05-08 NOTE — Progress Notes (Signed)
Summary: mailbox full could not leave msg/ pt due for ov  Phone Note Call from Patient Call back at Work Phone [phone]   Caller: Patient Summary of Call: Pt called and left msg on VM would like a rx to help with sleep. --Pt due for office visit, tried to call pt, mailbox not set up yet, other phone mailbox is full could not leave msg .Kandice Hams  July 12, 2009 2:52 PM  Initial call taken by: Kandice Hams,  July 12, 2009 2:52 PM

## 2010-05-08 NOTE — Letter (Signed)
Summary: Primary Care Appointment Letter  [facility] [place]  [address] [place], [place] [address]   Phone: [phone]  Fax: [phone]    09/06/2008 MRN: [id number]  JESSALYN HINOJOSA [address] [place], [place]  [address]  Dear [name],   Your Primary Care Physician Nolon Rod. Paz MD has indicated that:    ____X___it is time to schedule an appointment for follow up and fasting labs.     _______you missed your appointment on______ and need to call and          reschedule.    _______you need to have lab work done.    _______you need to schedule an appointment discuss lab or test results.    _______you need to call to reschedule your appointment that is                       scheduled on _________.     Please call our office as soon as possible. Our phone number is [phone]______.  Our office is open 8a-5p, Monday through Friday.     Thank you,    [facility]

## 2010-05-08 NOTE — Medication Information (Signed)
Summary: Interaction with Sim[facility]atin & Cardizem/[facility]  Interaction with [facility] & Card[facility]em/[facility]   Imported By: Lanelle Bal 10/27/2009 08:37:54  _____________________________________________________________________  External Attachment:    Type:   Image     Comment:   External Document  Appended Document: Interaction with Sim[facility]atin & Cardizem/[facility] she was recently restarted on Zocor 20 mg. Will decrease dose to 10 mg because she is on Cardizem

## 2010-05-08 NOTE — Progress Notes (Signed)
Summary: PAZ-REFILL FKYIXETUBE GCK 40MG   Medications Added FLUOXETINE HCL 40 MG  CAPS (FLUOXETINE HCL) 1 by mouth qd       Phone Note Refill Request   Refills Requested: Medication #1:  FLUOXETINE HCL 40MG  TAKE ONE CAPSULE EVERY DAY RECD BY [facility]X [id number] [address] [phone] LAST FILLED #31 ON 8.09.08  Initial call taken by: Gwen Pounds,  March 09, 2007 8:28 AM  Follow-up for Phone Call        last filled 8/08 called pt - she has only been taking as needed basis - advised needs to take once daily - she states she realizes she needs it once daily - has cpx 12/18 ..................................................................Marland KitchenShary Decamp  March 09, 2007 4:59 PM     New/Updated Medications: FLUOXETINE HCL 40 MG  CAPS (FLUOXETINE HCL) 1 by mouth qd   Prescriptions: FLUOXETINE HCL 40 MG  CAPS (FLUOXETINE HCL) 1 by mouth qd  #30 x 0   Entered by:   Shary Decamp   Authorized by:   Nolon Rod. Paz MD   Signed by:   Shary Decamp on 03/09/2007   Method used:   Electronically sent to ...       [facility]  [address] [phone]*       [address]       [place], [place]  [address]       Ph: [phone] or [phone]       Fax: [phone]   RxID:   [id number]

## 2010-05-10 NOTE — Progress Notes (Signed)
Summary: prior auth--Zolpidem  Phone Note Refill Request Message from:  Fax from Pharmacy on March 23, 2010 2:01 PM  Refills Requested: Medication #1:  AMBIEN 10 MG TABS 1 by mouth at bedtime as needed insomnia. NO ADDITIONAL REFILLS WITHOUT APPOINTMENT.Marland Kitchen prior Berkley Harvey - [id number] - id [id number]  Initial call taken by: Okey Regal Spring,  March 23, 2010 2:01 PM  Follow-up for Phone Call        Automated system states fax will be sent to our office shortly. Lucious Groves CMA  March 23, 2010 3:26 PM   Completed and faxed, will await insurance company reply. Lucious Groves CMA  March 26, 2010 10:56 AM      Appended Document: prior auth--Zolpidem Approved until 03/26/11.

## 2010-05-10 NOTE — Progress Notes (Signed)
Summary: refill  Phone Note Refill Request Message from:  Fax from Pharmacy on March 22, 2010 10:06 AM  Refills Requested: Medication #1:  AMBIEN 10 MG TABS 1 by mouth at bedtime as needed insomnia. [facility] [address] [facility] fax [phone]  Initial call taken by: Okey Regal Spring,  March 22, 2010 10:07 AM  Follow-up for Phone Call        last refilled 02/28/10 Follow-up by: Army Fossa CMA,  March 22, 2010 10:38 AM  Additional Follow-up for Phone Call Additional follow up Details #1::        30, no RF no further RF W/o OV Jose E. Paz MD  March 22, 2010 12:43 PM     New/Updated Medications: AMBIEN 10 MG TABS (ZOLPIDEM TARTRATE) 1 by mouth at bedtime as needed insomnia. NO ADDITIONAL REFILLS WITHOUT APPOINTMENT. Prescriptions: AMBIEN 10 MG TABS (ZOLPIDEM TARTRATE) 1 by mouth at bedtime as needed insomnia. NO ADDITIONAL REFILLS WITHOUT APPOINTMENT.  #30 x 0   Entered by:   Army Fossa CMA   Authorized by:   Nolon Rod. Paz MD   Signed by:   Army Fossa CMA on 03/22/2010   Method used:   Printed then faxed to ...       [facility]  [address] [phone]* (retail)       [address]       [place], [place]  [address]       Ph: [phone] or [phone]       Fax: [phone]   RxID:   [id number]

## 2010-05-10 NOTE — Letter (Signed)
Summary: Unable To Reach-Consult Scheduled  [facility] [place]  [address] [place], [place] [address]   Phone: [phone]  Fax: [phone]    03/26/2010 MRN: [id number]    Dear [name],   We have been unable to reach you by phone.  Please contact our office with an updated phone number. Please set up your B12 injections.    Thank you,  Army Fossa CMA  March 26, 2010 4:24 PM

## 2010-05-10 NOTE — Progress Notes (Signed)
Summary: refill  Phone Note Refill Request Message from:  Fax from Pharmacy on May 02, 2010 8:35 AM  Refills Requested: Medication #1:  AMBIEN 10 MG TABS 1 by mouth at bedtime as needed insomnia. NO ADDITIONAL REFILLS WITHOUT APPOINTMENT. [facility] [address] - fax [phone] - phone [phone]  Initial call taken by: Okey Regal Spring,  May 02, 2010 8:36 AM  Follow-up for Phone Call        OV needed, called to discuss and cannot leave message due to mailbox being full. Lucious Groves CMA  May 02, 2010 9:13 AM   Patient is coming in this afternoon @ 2:20pm for appt.Harold Barban  May 02, 2010 9:43 AM  Additional Follow-up for Phone Call Additional follow up Details #1::        Noted, prescription can be taken care of at office visit.  Additional Follow-up by: Lucious Groves CMA,  May 02, 2010 11:00 AM

## 2010-05-10 NOTE — Medication Information (Signed)
Summary: Care Consideration Regarding Diltiazem & Simvastatin/[facility]  Care Consideration Regarding Diltiazem & Simvastatin/[facility]   Imported By: Lanelle Bal 04/11/2010 12:32:36  _____________________________________________________________________  External Attachment:    Type:   Image     Comment:   External Document

## 2010-05-10 NOTE — Progress Notes (Signed)
Summary: labs (lmom 12/12, 12/13, 12/15, 12/16)   Phone Note Outgoing Call    Follow-up for Phone Call        advise patient: --vitamin D is low, start ergocalciferol 50,000 units weekly for 3 months, then continue with vitamin D over the counter daily.  send a prescription --B12 is low, she needs a B12 shot weekly x4 then monthly for the rest of her life. she could have her injections here or self inject --her cholesterol is close to goal, continue  simvastatin and work on her diet. --due for a visit, please  schedule one for after the holidays --she is due for a visit   Follow-up by: Jose E. Paz MD,  March 19, 2010 11:00 AM  Additional Follow-up for Phone Call Additional follow up Details #1::        inbox full, unable to leave message. Army Fossa CMA  March 19, 2010 11:18 AM      Additional Follow-up for Phone Call Additional follow up Details #2::    home number inbox full, work number inbox full. Army Fossa CMA  March 20, 2010 10:36 AM   inbox full. Army Fossa CMA  March 22, 2010 2:40 PM  inbox full. Army Fossa CMA  March 23, 2010 11:44 AM    Additional Follow-up for Phone Call Additional follow up Details #3:: Details for Additional Follow-up Action Taken: mailed pt a letter and copy of labs and rx for vitamin d. Army Fossa CMA  March 26, 2010 4:23 PM   New/Updated Medications: VITAMIN D (ERGOCALCIFEROL) 50000 UNIT CAPS (ERGOCALCIFEROL) 1 po weekly for 3 months, then continue with vitamin D over the counter daily. Prescriptions: VITAMIN D (ERGOCALCIFEROL) 50000 UNIT CAPS (ERGOCALCIFEROL) 1 po weekly for 3 months, then continue with vitamin D over the counter daily.  #12 x 0   Entered by:   Army Fossa CMA   Authorized by:   Nolon Rod. Paz MD   Signed by:   Army Fossa CMA on 03/26/2010   Method used:   Print then Give to Patient   RxID:   [id number]

## 2010-05-10 NOTE — Medication Information (Signed)
Summary: Prior Authorization for Zolpidem Tartrate/[facility]  Prior Authorization for Zolpidem Tartrate/[facility]   Imported By: Lanelle Bal 04/11/2010 10:25:18  _____________________________________________________________________  External Attachment:    Type:   Image     Comment:   External Document

## 2010-05-11 ENCOUNTER — Encounter: Payer: Self-pay | Admitting: Internal Medicine

## 2010-05-16 NOTE — Progress Notes (Signed)
Summary: Refill Request  Phone Note Refill Request Call back at [phone] Message from:  Pharmacy on May 07, 2010 8:36 AM  Refills Requested: Medication #1:  AMBIEN 10 MG TABS 1 by mouth at bedtime as needed insomnia. NO ADDITIONAL REFILLS WITHOUT APPOINTMENT.   Dosage confirmed as above?Dosage Confirmed   Brand Name Necessary? No   Supply Requested: 30   [facility] [address]   Next Appointment Scheduled: 2.3.12 Initial call taken by: Harold Barban,  May 07, 2010 8:36 AM  Follow-up for Phone Call        denied, needs OV Joe Tanney E. Natha Guin MD  May 07, 2010 9:37 AM   Additional Follow-up for Phone Call Additional follow up Details #1::        Has one Scheduled 05/11/10. Army Fossa CMA  May 07, 2010 9:43 AM  ok #30, no Rf Munir Victorian E. Leyah Bocchino MD  May 07, 2010 12:02 PM     Prescriptions: AMBIEN 10 MG TABS (ZOLPIDEM TARTRATE) 1 by mouth at bedtime as needed insomnia. NO ADDITIONAL REFILLS WITHOUT APPOINTMENT.  #30 x 0   Entered by:   Lucious Groves CMA   Authorized by:   Nolon Rod. Nikkol Pai MD   Signed by:   Lucious Groves CMA on 05/07/2010   Method used:   Printed then faxed to ...       [facility]  [address] [phone]* (retail)       [address]       [place], [place]  [address]       Ph: [phone] or [phone]       Fax: [phone]   RxID:   [id number]

## 2010-06-08 ENCOUNTER — Telehealth: Payer: Self-pay | Admitting: Internal Medicine

## 2010-06-14 NOTE — Progress Notes (Signed)
Summary: Zolpidem refill  Phone Note Refill Request Message from:  Fax from Pharmacy on June 08, 2010 1:47 PM  Refills Requested: Medication #1:  AMBIEN 10 MG TABS 1 by mouth at bedtime as needed insomnia. NO ADDITIONAL REFILLS WITHOUT APPOINTMENT. [facility] [phone], [address] [place], [place]   phone [phone]    fax - [phone]    qty = 30  Next Appointment Scheduled: none Initial call taken by: Jerolyn Shin,  June 08, 2010 1:48 PM  Follow-up for Phone Call        30, 1 RF Follow-up by: Nolon Rod. Kaarin Pardy MD,  June 08, 2010 3:41 PM    Prescriptions: AMBIEN 10 MG TABS (ZOLPIDEM TARTRATE) 1 by mouth at bedtime as needed insomnia. NO ADDITIONAL REFILLS WITHOUT APPOINTMENT.  #30 x 1   Entered by:   Army Fossa CMA   Authorized by:   Nolon Rod. Cleven Jansma MD   Signed by:   Army Fossa CMA on 06/08/2010   Method used:   Printed then faxed to ...       [facility]  [address] [phone]* (retail)       [address]       [place], [place]  [address]       Ph: [phone] or [phone]       Fax: [phone]   RxID:   [id number]

## 2010-06-18 LAB — URINALYSIS, ROUTINE W REFLEX MICROSCOPIC
Bilirubin Urine: NEGATIVE
Glucose, UA: NEGATIVE mg/dL
Ketones, ur: NEGATIVE mg/dL
Nitrite: NEGATIVE
Protein, ur: 30 mg/dL — AB
Specific Gravity, Urine: 1.027 (ref 1.005–1.030)
Urobilinogen, UA: 0.2 mg/dL (ref 0.0–1.0)
pH: 6 (ref 5.0–8.0)

## 2010-06-18 LAB — DIFFERENTIAL
Basophils Absolute: 0 10*3/uL (ref 0.0–0.1)
Basophils Absolute: 0 10*3/uL (ref 0.0–0.1)
Basophils Relative: 0 % (ref 0–1)
Basophils Relative: 0 % (ref 0–1)
Eosinophils Absolute: 0.4 10*3/uL (ref 0.0–0.7)
Eosinophils Absolute: 0.4 10*3/uL (ref 0.0–0.7)
Eosinophils Relative: 3 % (ref 0–5)
Eosinophils Relative: 3 % (ref 0–5)
Lymphocytes Relative: 31 % (ref 12–46)
Lymphocytes Relative: 39 % (ref 12–46)
Lymphs Abs: 3.5 10*3/uL (ref 0.7–4.0)
Lymphs Abs: 5.7 10*3/uL — ABNORMAL HIGH (ref 0.7–4.0)
Monocytes Absolute: 1 10*3/uL (ref 0.1–1.0)
Monocytes Absolute: 1.1 10*3/uL — ABNORMAL HIGH (ref 0.1–1.0)
Monocytes Relative: 10 % (ref 3–12)
Monocytes Relative: 7 % (ref 3–12)
Neutro Abs: 6.1 10*3/uL (ref 1.7–7.7)
Neutro Abs: 7.4 10*3/uL (ref 1.7–7.7)
Neutrophils Relative %: 51 % (ref 43–77)
Neutrophils Relative %: 55 % (ref 43–77)

## 2010-06-18 LAB — CBC
HCT: 36.9 % (ref 36.0–46.0)
HCT: 37.6 % (ref 36.0–46.0)
Hemoglobin: 11.6 g/dL — ABNORMAL LOW (ref 12.0–15.0)
Hemoglobin: 11.9 g/dL — ABNORMAL LOW (ref 12.0–15.0)
MCH: 28.5 pg (ref 26.0–34.0)
MCH: 28.7 pg (ref 26.0–34.0)
MCHC: 31.4 g/dL (ref 30.0–36.0)
MCHC: 31.6 g/dL (ref 30.0–36.0)
MCV: 90.7 fL (ref 78.0–100.0)
MCV: 90.8 fL (ref 78.0–100.0)
Platelets: 361 10*3/uL (ref 150–400)
Platelets: 472 10*3/uL — ABNORMAL HIGH (ref 150–400)
RBC: 4.07 MIL/uL (ref 3.87–5.11)
RBC: 4.14 MIL/uL (ref 3.87–5.11)
RDW: 14.7 % (ref 11.5–15.5)
RDW: 14.9 % (ref 11.5–15.5)
WBC: 11.1 10*3/uL — ABNORMAL HIGH (ref 4.0–10.5)
WBC: 14.6 10*3/uL — ABNORMAL HIGH (ref 4.0–10.5)

## 2010-06-18 LAB — URINE MICROSCOPIC-ADD ON

## 2010-06-18 LAB — BASIC METABOLIC PANEL
BUN: 11 mg/dL (ref 6–23)
CO2: 26 mEq/L (ref 19–32)
Calcium: 8.7 mg/dL (ref 8.4–10.5)
Chloride: 108 mEq/L (ref 96–112)
Creatinine, Ser: 0.93 mg/dL (ref 0.4–1.2)
GFR calc Af Amer: >60 mL/min (ref >60)
GFR calc non Af Amer: >60 mL/min (ref >60)
Glucose, Bld: 110 mg/dL — ABNORMAL HIGH (ref 70–99)
Potassium: 3.5 mEq/L (ref 3.5–5.1)
Sodium: 137 mEq/L (ref 135–145)

## 2010-06-18 LAB — POCT I-STAT, CHEM 8
BUN: 16 mg/dL (ref 6–23)
Calcium, Ion: 1.07 mmol/L — ABNORMAL LOW (ref 1.12–1.32)
Chloride: 108 mEq/L (ref 96–112)
Creatinine, Ser: 1 mg/dL (ref 0.4–1.2)
Glucose, Bld: 153 mg/dL — ABNORMAL HIGH (ref 70–99)
HCT: 38 % (ref 36.0–46.0)
Hemoglobin: 12.9 g/dL (ref 12.0–15.0)
Potassium: 3.3 mEq/L — ABNORMAL LOW (ref 3.5–5.1)
Sodium: 142 mEq/L (ref 135–145)
TCO2: 24 mmol/L (ref 0–100)

## 2010-06-18 LAB — MAGNESIUM: Magnesium: 2.3 mg/dL (ref 1.5–2.5)

## 2010-06-18 LAB — TSH: TSH: 7.466 u[IU]/mL — ABNORMAL HIGH (ref 0.350–4.500)

## 2010-06-24 LAB — CBC
HCT: 39.4 % (ref 36.0–46.0)
Hemoglobin: 12.7 g/dL (ref 12.0–15.0)
MCHC: 32.3 g/dL (ref 30.0–36.0)
MCV: 87.7 fL (ref 78.0–100.0)
Platelets: 408 10*3/uL — ABNORMAL HIGH (ref 150–400)
RBC: 4.49 MIL/uL (ref 3.87–5.11)
RDW: 16 % — ABNORMAL HIGH (ref 11.5–15.5)
WBC: 10 10*3/uL (ref 4.0–10.5)

## 2010-06-24 LAB — URINE MICROSCOPIC-ADD ON

## 2010-06-24 LAB — DIFFERENTIAL
Basophils Absolute: 0.1 10*3/uL (ref 0.0–0.1)
Basophils Relative: 1 % (ref 0–1)
Eosinophils Absolute: 0.3 10*3/uL (ref 0.0–0.7)
Eosinophils Relative: 3 % (ref 0–5)
Lymphocytes Relative: 28 % (ref 12–46)
Lymphs Abs: 2.8 10*3/uL (ref 0.7–4.0)
Monocytes Absolute: 0.5 10*3/uL (ref 0.1–1.0)
Monocytes Relative: 5 % (ref 3–12)
Neutro Abs: 6.3 10*3/uL (ref 1.7–7.7)
Neutrophils Relative %: 63 % (ref 43–77)

## 2010-06-24 LAB — URINE CULTURE: Colony Count: 5000

## 2010-06-24 LAB — URINALYSIS, ROUTINE W REFLEX MICROSCOPIC
Bilirubin Urine: NEGATIVE
Glucose, UA: NEGATIVE mg/dL
Ketones, ur: NEGATIVE mg/dL
Nitrite: NEGATIVE
Protein, ur: 100 mg/dL — AB
Specific Gravity, Urine: 1.021 (ref 1.005–1.030)
Urobilinogen, UA: 0.2 mg/dL (ref 0.0–1.0)
pH: 6 (ref 5.0–8.0)

## 2010-06-24 LAB — BASIC METABOLIC PANEL
BUN: 13 mg/dL (ref 6–23)
CO2: 24 mEq/L (ref 19–32)
Calcium: 9 mg/dL (ref 8.4–10.5)
Chloride: 109 mEq/L (ref 96–112)
Creatinine, Ser: 0.93 mg/dL (ref 0.4–1.2)
GFR calc Af Amer: >60 mL/min (ref >60)
GFR calc non Af Amer: >60 mL/min (ref >60)
Glucose, Bld: 121 mg/dL — ABNORMAL HIGH (ref 70–99)
Potassium: 3.6 mEq/L (ref 3.5–5.1)
Sodium: 141 mEq/L (ref 135–145)

## 2010-06-24 LAB — POCT CARDIAC MARKERS
CKMB, poc: 2.3 ng/mL (ref 1.0–8.0)
CKMB, poc: 3.7 ng/mL (ref 1.0–8.0)
Myoglobin, poc: 116 ng/mL (ref 12–200)
Myoglobin, poc: 154 ng/mL (ref 12–200)
Troponin i, poc: <0.05 ng/mL (ref 0.00–0.09)
Troponin i, poc: <0.05 ng/mL (ref 0.00–0.09)

## 2010-07-11 ENCOUNTER — Encounter: Payer: Self-pay | Admitting: Internal Medicine

## 2010-07-19 ENCOUNTER — Encounter: Payer: Self-pay | Admitting: Internal Medicine

## 2010-07-23 ENCOUNTER — Encounter: Payer: Self-pay | Admitting: Internal Medicine

## 2010-07-23 ENCOUNTER — Ambulatory Visit (INDEPENDENT_AMBULATORY_CARE_PROVIDER_SITE_OTHER): Payer: BLUE CROSS/BLUE SHIELD | Admitting: Internal Medicine

## 2010-07-23 DIAGNOSIS — E538 Deficiency of other specified B group vitamins: Secondary | ICD-10-CM | POA: Insufficient documentation

## 2010-07-23 DIAGNOSIS — J45909 Unspecified asthma, uncomplicated: Secondary | ICD-10-CM

## 2010-07-23 DIAGNOSIS — E039 Hypothyroidism, unspecified: Secondary | ICD-10-CM

## 2010-07-23 DIAGNOSIS — M542 Cervicalgia: Secondary | ICD-10-CM

## 2010-07-23 DIAGNOSIS — I2510 Atherosclerotic heart disease of native coronary artery without angina pectoris: Secondary | ICD-10-CM

## 2010-07-23 DIAGNOSIS — I10 Essential (primary) hypertension: Secondary | ICD-10-CM

## 2010-07-23 DIAGNOSIS — Z1239 Encounter for other screening for malignant neoplasm of breast: Secondary | ICD-10-CM | POA: Insufficient documentation

## 2010-07-23 MED ORDER — VITAMIN B-12 1000 MCG PO TABS: 1000.0000 ug | ORAL_TABLET | Freq: Every day | ORAL | Status: DC

## 2010-07-23 MED ORDER — CYCLOBENZAPRINE HCL 10 MG PO TABS: 10.0000 mg | ORAL_TABLET | Freq: Two times a day (BID) | ORAL | Status: DC | PRN

## 2010-07-23 MED ORDER — CYANOCOBALAMIN 1000 MCG/ML IJ SOLN
1000.0000 ug | Freq: Once | INTRAMUSCULAR | Status: AC
  Administered 2010-07-23: 1000 ug via INTRAMUSCULAR

## 2010-07-23 NOTE — Assessment & Plan Note (Signed)
Well-controlled, no change 

## 2010-07-23 NOTE — Assessment & Plan Note (Addendum)
H/o B12 def, on no suplements Discussed the importance of shots  for the rest of her life See instructions

## 2010-07-23 NOTE — Assessment & Plan Note (Signed)
Asx, on no meds

## 2010-07-23 NOTE — Patient Instructions (Signed)
Heating pad to the left neck, flexeril as prescribed and Motrin OTC as needed. If pain no better in 3 weeks: please call You need a B12 shot every week for one month, then 1 shot a month for the rest of your life Please call gastroenterology, you are over due for a colonoscopy: [phone]

## 2010-07-23 NOTE — Progress Notes (Signed)
  Subjective:    Patient ID: Kayla [name], female    DOB: [birth year], 65 y.o.   MRN: [id number]  HPI History of B12 deficiency, has not been taking any supplements One month history of tender "knot" at the left side of the neck. The area has not increased in size. History of hypothyroidism,   TSH was checked at the emergency room few months ago and was ~ 7 History of anxiety, used to be on Prozac, she self discontinued such medicine, reports that her anxiety is much better. History of asthma, she was on symbicort, self discontinued that med a few months ago, doing well  Past Medical History  Diagnosis Date  . Asthma   . GERD (gastroesophageal reflux disease)   . Hypertension   . Hypothyroid   . Chronic LBP   . B12 deficiency   . IBS (irritable bowel syndrome)   . CAD (coronary artery disease) 10/2007    abnormal stress test and nonobstructive CAD per cardiac cath performed on October 22, 2007, per Dr. Casimiro Needle Copper  . Hyperlipemia    Past Surgical History  Procedure Date  . Neck surgery 1998  . Cholecystectomy      Review of Systems No fever or weight loss No rash anywhere, no skin lesions Complaining of fatigue    Objective:   Physical Exam  Constitutional: She appears well-developed.       Overweight appearing  HENT:  Head: Normocephalic and atraumatic.  Mouth/Throat: No oropharyngeal exudate.  Eyes: Conjunctivae are normal. No scleral icterus.  Neck: No thyromegaly present.       Range of motion is normal. No mass or lymphadenopathy is noted. The SCM on the left side seems tender to palpation.  Cardiovascular: Normal rate and regular rhythm.   Pulmonary/Chest: Effort normal and breath sounds normal. No respiratory distress. She has no wheezes. She has no rales.  Musculoskeletal: She exhibits no edema.  Skin: Skin is warm and dry.  Psychiatric: Her behavior is normal. Judgment and thought content normal.          Assessment & Plan:

## 2010-07-23 NOTE — Assessment & Plan Note (Signed)
Reports good compliance w/ meds Hat two TSHs checked 03-2010, on was normal the other was ~ 7 (?compliance w/ meds???) Paln: labs

## 2010-07-23 NOTE — Assessment & Plan Note (Signed)
She was due to have another cscope 2009, we attempted to refer her last year. Encouraged to call GI See instructions

## 2010-07-23 NOTE — Assessment & Plan Note (Addendum)
C/o a "tender knot in the neck" on exam i feel no mass or LAD, she is tender at the [facility]. Will treat as a muscle pain, See instructions .

## 2010-07-30 ENCOUNTER — Ambulatory Visit: Payer: BLUE CROSS/BLUE SHIELD

## 2010-07-31 ENCOUNTER — Ambulatory Visit (INDEPENDENT_AMBULATORY_CARE_PROVIDER_SITE_OTHER): Payer: BLUE CROSS/BLUE SHIELD | Admitting: *Deleted

## 2010-07-31 DIAGNOSIS — E538 Deficiency of other specified B group vitamins: Secondary | ICD-10-CM

## 2010-07-31 MED ORDER — CYCLOBENZAPRINE HCL 10 MG PO TABS: 10.0000 mg | ORAL_TABLET | Freq: Two times a day (BID) | ORAL | Status: AC | PRN

## 2010-07-31 MED ORDER — CYANOCOBALAMIN 1000 MCG/ML IJ SOLN
1000.0000 ug | Freq: Once | INTRAMUSCULAR | Status: AC
  Administered 2010-07-31: 1000 ug via INTRAMUSCULAR

## 2010-08-02 ENCOUNTER — Other Ambulatory Visit: Payer: Self-pay | Admitting: *Deleted

## 2010-08-02 MED ORDER — ZOLPIDEM TARTRATE 10 MG PO TABS: 10.0000 mg | ORAL_TABLET | Freq: Every evening | ORAL | Status: DC | PRN

## 2010-08-02 NOTE — Telephone Encounter (Signed)
30, 3 RF 

## 2010-08-02 NOTE — Progress Notes (Signed)
We are trying to redraw

## 2010-08-06 ENCOUNTER — Encounter: Payer: Self-pay | Admitting: *Deleted

## 2010-08-16 ENCOUNTER — Ambulatory Visit (INDEPENDENT_AMBULATORY_CARE_PROVIDER_SITE_OTHER): Payer: BLUE CROSS/BLUE SHIELD | Admitting: *Deleted

## 2010-08-16 DIAGNOSIS — E039 Hypothyroidism, unspecified: Secondary | ICD-10-CM

## 2010-08-16 DIAGNOSIS — E538 Deficiency of other specified B group vitamins: Secondary | ICD-10-CM

## 2010-08-16 MED ORDER — CYANOCOBALAMIN 1000 MCG/ML IJ SOLN
1000.0000 ug | Freq: Once | INTRAMUSCULAR | Status: AC
  Administered 2010-08-16: 1000 ug via INTRAMUSCULAR

## 2010-08-17 LAB — TSH: TSH: 0.18 u[IU]/mL — ABNORMAL LOW (ref 0.35–5.50)

## 2010-08-21 NOTE — Discharge Summary (Signed)
NAME:  Kayla [name], Kayla [name]                    ACCOUNT NO.:  [id number]   MEDICAL RECORD NO.:  [id number]          PATIENT TYPE:  INP   LOCATION:  3731                         FACILITY:  [facility]   PHYSICIAN:  Valerie A. Felicity Coyer, MDDATE OF BIRTH:  Jul 02, 1945   DATE OF ADMISSION:  10/20/2007  DATE OF DISCHARGE:  10/23/2007                               DISCHARGE SUMMARY   DISCHARGE DIAGNOSES:  1. Chest pain with abnormal stress test and nonobstructive coronary      artery disease per cardiac catheterization performed on October 22, 2007, per Dr. Tonny Bollman.  2. Hypothyroid with very elevated thyroid-stimulating hormone during      this admission, Synthroid increased with plans for outpatient      thyroid function test in 4-6 weeks.  3. Hyperlipidemia.  The patient refusing statin, instructed on low-      cholesterol diet.  4. Hypertension, stable.   HISTORY OF PRESENT ILLNESS:  Kayla [name] is a 65 year old female who was  admitted on October 20, 2007, with chief complaint of chest pain.  She  apparently started having episode several days prior to this admission  which she described as midsternal versus wheezing.  Her symptoms  recurred on the day of admission.  Her friend urged her to come to the  emergency room.  In the ER, her EKG was unremarkable except for  occasional PVCs.  CT angio of the chest was negative for PE, which was  performed due to slightly elevated D-dimer.  She was admitted for  further evaluation and treatment.   PAST MEDICAL HISTORY:  1. Hypertension.  2. Obesity.  3. Hypothyroidism.   COURSE OF HOSPITALIZATION:  Chest pain atypical.  The patient was  admitted.  She underwent a stress test performed on October 21, 2007.  Stress test findings were suspicious for reversibility and ischemia in  the septal and inferior walls.  Left ventricular ejection fraction  measured at 62%.  These findings prompted cardiac catheterization which  was performed on October 22, 2007, by Dr.  Tonny Bollman.  This noted  nonobstructive coronary artery disease.  At this time, we plan to  continue medical management which will include a baby aspirin once  daily.  She was noted to have dyslipidemia with a total cholesterol of  281 and HDL 30 and LDL of 207.  A statin therapy was recommended to the  patient which she is currently refusing.  Of note, her TSH was also  noted to be 150.  She does note some missed doses of her Synthroid;  however, states she has otherwise been taking it.  We will increase her  Synthroid from 175-200 mcg p.o. daily, and she will need a followup  thyroid function testing in 4-6 weeks.   MEDICATIONS AT THE TIME OF DISCHARGE:  1. Aspirin 81 mg p.o. daily.  2. Synthroid 200 mcg p.o. daily.  3. Cardizem LA 360 mg p.o. daily.  4. Paroxetine 40 mg p.o. daily.   PERTINENT LABORATORIES DATA AT THE TIME OF DISCHARGE:  TSH 150, INR is  0.9, total cholesterol 281, HDL 30, and LDL 207.   DISPOSITION:  She will be discharged to home.   FOLLOW UP:  She was instructed to follow up with Dr. Willow Ora.      Sandford Craze, NP      Raenette Rover. Felicity Coyer, MD  Electronically Signed    MO/MEDQ  D:  10/23/2007  T:  10/23/2007  Job:  [id number]   cc:   Willow Ora, MD

## 2010-08-21 NOTE — Consult Note (Signed)
NAME:  Kayla [name], Kayla [name] NO.:  [id number]   MEDICAL RECORD NO.:  [id number]          PATIENT TYPE:  OBV   LOCATION:  3731                         FACILITY:  [facility]   PHYSICIAN:  Luis Abed, MD, FACCDATE OF BIRTH:  October 26, 1945   DATE OF CONSULTATION:  DATE OF DISCHARGE:                                 CONSULTATION   PRIMARY CARE PHYSICIAN:  Willow Ora, MD   ENT:  Alfonse Flavors, MD   HISTORY:  Kayla [name] is a 65 year old white female who was admitted by her  primary care physician to the emergency room secondary to chest  discomfort.  Ms. [name] describes at least a 3-day history of anterior  chest squeezing on grabbing sensation with occasional radiation to her  right shoulder.  Denies associated shortness of breath, nausea,  vomiting, diaphoresis.  The discomfort can occur any time; however, she  primarily notices that more at night and last less than 3 minutes and  she gives as a 3-4 on a scale of 0-10.  She cannot describe specific  alleviating or aggravating symptoms; however, yesterday while going out  on steps, a friend noted dyspnea on exertion at the end of her climb,  she had similar symptoms and her friend insisted that she come to the  emergency room for evaluation.  Her daughter states that she has chronic  shortness of breath and/or exercises.  Her activities are very limited.  The patient also describes bilateral shoulder aches and pains from  history of torn rotator cuff.  She is not sure if this discomfort is  related to that.   ALLERGIES:  There is a possible ACE INHIBITOR allergy as what describes  as anterior edema in March 2005, but it is not clear as to how long she  had been on the ACE inhibitor (Prinivil) prior to the onset of these  symptoms.  It was noted at that time she is on multiple antibiotics.   MEDICATIONS PRIOR TO ADMISSION:  Include.  1. Synthroid 175 mg daily.  2. Cardizem unknown dosage nightly.  3. Motrin p.r.n.  4. Aspirin 81  p.r.n.  5. Tylenol Sinus q.a.m.  6. Ambien unknown dose p.m. p.r.n.   Currently, at this time she is not on any of her home medications.  She  has a history of hypertension that she does not check at home, obesity,  hypothyroidism.  She states her last TSH was in January and her  medication was increased.  Specific values unknown.  The last value that  we have is data from [facility] 2007, and shows a TSH of 0.49.  She had  an echocardiogram on April 21, 2007, showed an EF of 60-65%, mild  diastolic dysfunction, hyperlipidemia; however, she refused to be placed  on Lipitor because she states that her husband was killed secondary to  this medication apparently he was prescribed Lipitor, but he never  followed up in regards to lipids and LFTs and had extensive liver  scarring which resulted in heart failure and his death.  She has a  history GERD,  IBF status post esophageal dilatation in February 2004, C-  spine for fracture with a plate, C-section, lap-chole in June 2002.   SOCIAL HISTORY:  Resides in [place] with her son, daughter-in-law and  their 3 children.  She is a retired Microbiologist.  She  denies tobacco, alcohol, drugs, or herbal medications.  Tries to  maintain a low-salt diet.  She does not exercise secondary to problems  with her back.   FAMILY HISTORY:  Her mother is alive at age 8, alive and well.  Father  deceased at age 34 with myocardial infarction.  Brother is alive at 71  with a history of pericarditis.   REVIEW OF SYSTEMS:  In addition to the above is notable for sweats,  weight gain of 10 pounds since her husband's death in March 13, 2009were  has wear glasses for driving, hearing loss, right worse than left,  multiple crown in regards to her dentition, chronic sinus problems  secondary to seasonal allergies, chronic cough, chronic dyspnea on  exertion, occasional left ankle edema, occasional palpitations which she  described as skipping.  She  also describes a history of snoring for  which she was suppose to have a sleep study, but she did not show  secondary to her husband's recent death.  She has a history of nocturia  postmenopausal, arthralgias particularly in her shoulders, neck, and  back, intermittent diarrhea, GERD.   PHYSICAL EXAM:  GENERAL:  Well-nourished, well-developed obese white  female in no apparent distress.  Family is present.  VITAL SIGNS:  Temperature 93, blood pressure 140/85, pulse 76,  respirations 20, 98% sat on 2 liters.  HEENT:  Unremarkable.  NECK:  Supple without thyromegaly, adenopathy, JVD or carotid bruits.  CHEST:  Symmetrical excursion.  LUNGS:  Clear to auscultation, breath sounds diminished.  HEART:  PMI is nondisplaced.  Regular rate and rhythm.  Do not  appreciate any murmurs, rubs, clicks or gallops.  All pulses are  symmetrical and intact.  SKIN:  Integument is intact.  ABDOMEN:  Obese.  Bowel sounds present without organomegaly, masses or  tenderness.  EXTREMITIES:  Negative cyanosis, clubbing or edema.  MUSCULOSKELETAL:  Unremarkable.  NEURO:  Unremarkable.   Chest CT did not show any evidence of pulmonary embolism or thoracic  abnormalities, infiltrate, effusions or nodules.  EKG shows normal sinus  rhythm, baseline artifact, nonspecific ST-T wave changes.  H&H is 13.0  and 37.6, normal indices, platelets 400, WBCs 10.6.  I-STAT showed a  sodium of 141, potassium 3.6, BUN 14, creatinine 1.2, glucose 87.  CK-  MBs, relative indexes, and troponins were within normal limits for  myocardial infarction pattern, D-dimer was 0.81.  Total cholesterol was  elevated 281, triglycerides 21, HDL low at 38, LDL high at 207.   IMPRESSION:  Atypical chest discomfort with no myocardial infarction by  enzymes and EKGs; however Myoview review by Dr. Myrtis Ser shows an EF of 62%  possible, mild inferior ischemia, uncontrolled hypertension, obesity,  hyperlipidemia, history as listed per past medical  history.   DISPOSITION:  I have contacted her primary care's physician's office for  records status post stress Myoview there was reference and previous  admission in January note in regards to palpitations and an old EKG.  Dr. Myrtis Ser spoke at length with the patient and her family in regards to  cardiac catheterization for further evaluation.  The indications,  benefits, and risks were discussed with the patient and she agrees to  proceed as planned.  We will begin aspirin 325 mg p.o. daily.  Consideration should be given to resuming her home medications.  We will  not begin a statin at this time in 2 weeks to document known coronary  artery disease.      Joellyn Rued, PA-C      Luis Abed, MD, [facility]  Electronically Signed    EW/MEDQ  D:  10/21/2007  T:  10/22/2007  Job:  [id number]   cc:   Luis Abed, MD, [facility]  Harvel Ricks, Dr.

## 2010-08-21 NOTE — H&P (Signed)
NAME:  Kayla, [name]                    ACCOUNT NO.:  [id number]   MEDICAL RECORD NO.:  [id number]          PATIENT TYPE:  OBV   LOCATION:  3731                         FACILITY:  [facility]   PHYSICIAN:  Hollice Espy, M.D.DATE OF BIRTH:  07/03/1945   DATE OF ADMISSION:  10/20/2007  DATE OF DISCHARGE:                              HISTORY & PHYSICAL   PRIMARY CARE PHYSICIAN:  The patient's PCP is Willow Ora, M.D.   CHIEF COMPLAINT:  Chest pain.   HISTORY OF THE PRESENT ILLNESS:  The patient is a 65 year old white  female with a past history hypertension, obesity and hypothyroidism who  presents with 1-day history of chest pain.  She started having episodes  several days ago described as a midsternal versus a squeezing feeling.  Her  symptoms recurred today and her friend urged her to come into the  emergency room.  In the emergency room her initial EKG was unremarkable  occasional PVCs.  Initial cardiac markers were negative as well.  A D-  dimer was slightly elevated at 0.81, but a CT angio of chest was  unremarkable.  With these findings and the patient's medical risk  factors it was felt best that she come in for further evaluation and  treatment.   REVIEW OF SYSTEMS:  Currently she is feeling better.  She denies any  headaches, vision changes, dysphagia, chest pain, palpitations,  shortness of breath, wheeze, cough, abdominal pain, hematuria, dysuria,  constipation, diarrhea, or focal extremity numbness weakness or pain.  The review of systems is otherwise negative.   PAST MEDICAL HISTORY:  The patient's past medical history includes:  1. Obesity.  2. Hypertension.  3. Hypothyroidism.  4. The patient's lipid profile 6 months ago was noted to be elevated,      but she is not on any medications for.   MEDICATIONS:  The medications she is on include Cardizem, as needed  Motrin and Synthroid.   ALLERGIES:  The patient has an allergy to ACE INHIBITORS.   SOCIAL HISTORY:  No  tobacco, alcohol or drug use.   FAMILY HISTORY:  The family history is notable for CAD on both sides.   PHYSICAL EXAMINATION:  VITAL SIGNS:  On physical exam the patient's  vital signs on admission are temperature 98.3, heart rate 97, blood  pressure 154/92, respirations 18, and O2 sat 97% on 2 liters.  GENERAL APPEARANCE:  In general she is alert and oriented x3, and in no  apparent distress.  HEENT:  Normocephalic and atraumatic.  Her mucous membranes are moist.  NECK:  The patient has no carotid bruits.  HEART:  The heart had a regular rate and rhythm, and S1 and S2.  LUNGS:  The lungs are clear to auscultation bilaterally.  ABDOMEN:  The abdomen is soft, nontender and nondistended. Positive  bowel sounds.  EXTREMITIES:  The extremities show no clubbing or cyanosis, but do show  trace pitting edema.   LABORATORY DATA:  Lab work included white count is 10.6, H&H 13 and 38,  MCV of 88, platelet count 400,000 with no shift.  D-dimer 0.81.  CT  angio of the chest is negative for pulmonary embolus. Sodium 141,  potassium 2.6, chloride 110, BUN 14, creatinine 1.2, and glucose 87.  CPK 136, MB 3.61 and troponin I less than 0.05.  Her EKG shows normal  sinus rhythm, possible left atrial enlargement and  incomplete right  bundle branch block; I do not have a previous one to compare this to.   ASSESSMENT AND PLAN:  1. Chest pain.  Check three sets of enzymes and Cardiolite morning.      Negative D-dimer.  2. History of increased lipids.  Check a fasting lipid profile in the      morning.  3. History of hypertension.  4. History of hypothyroidism.      Hollice Espy, M.D.  Electronically Signed     SKK/MEDQ  D:  10/20/2007  T:  10/21/2007  Job:  [id number]   cc:   Willow Ora, MD

## 2010-08-23 ENCOUNTER — Telehealth: Payer: Self-pay | Admitting: *Deleted

## 2010-08-23 NOTE — Telephone Encounter (Signed)
Called pt on home and cell phone, mailboxes are both full. Will try again.

## 2010-08-23 NOTE — Telephone Encounter (Signed)
Message copied by Army Fossa on Thu Aug 23, 2010  1:46 PM ------      Message from: Willow Ora      Created: Thu Aug 23, 2010  1:06 PM       Please call patient. She reported she is taking levothyroxine  125 MCG  Two tablets a day.      If that is the case, anddecrease to  levothyroxine 100 mg 2 tablets daily.      Arrange for a TSH in 6 weeks, DX hypothyroidism

## 2010-08-24 NOTE — Telephone Encounter (Signed)
Cell phone mailbox is full, home mailbox is full.

## 2010-08-24 NOTE — Op Note (Signed)
[facility]  Patient:    Kayla [name], Kayla [name]                           MRN: [id number] Proc. Date: 09/10/00 Adm. Date:  [id number] Attending:  Glenna Fellows Tappan                           Operative Report  PREOPERATIVE DIAGNOSIS:   Symptomatic cholelithiasis.  POSTOPERATIVE DIAGNOSIS:  Symptomatic cholelithiasis.  OPERATION:  Laparoscopic cholecystectomy.  SURGEON:  Lorne Skeens. Hoxworth, M.D.  ASSISTANT:  Donnie Coffin. Samuella Cota, M.D.  ANESTHESIA:  General.  BRIEF HISTORY:  The patient is an 65 year old white female with a long history of gradually worsening episodic epigastric abdominal pain.  Workup up has included a gallbladder ultrasound showing multiple gallstones.  Common bile duct and LFTs normal.  Laparoscopic cholecystectomy has been recommended and accepted.  The nature of the procedure, its indications, the risks of bleeding, infection, bowel leak, bowel injury, and possible need for further surgery were discussed and understood preoperatively.  She is now brought to the operating room for this procedure.  DESCRIPTION OF PROCEDURE:  The patient was brought to the operating room and placed in supine position on the operating table, and general endotracheal anesthesia was induced.  PAS were in place.  She was given Ancef preoperatively.  The abdomen was sterilely prepped and draped.  Local anesthesia was used to infiltrate the trocar sites prior to the incisions.  A 1 cm incision was made in the umbilicus, and dissection was carried down to the midline fascia.  This was sharply incised for 1 cm and peritoneum entered under direct vision.  Through a mattress suture of 0 Vicryl, the Hasson trocar was placed and pneumoperitoneum established.  Under direct vision, a 10 mm trocar was placed in the subxiphoid area and two 5 mm trocars along the right subcostal margin.  The fundus of the gallbladder was visualized, grasped, and elevated up over the  liver and the infundibulum retracted inferolaterally. Fatty hepatic tissue was stripped off the neck of the gallbladder toward the porta hepatis.  The distal gallbladder and Calots triangle was thoroughly dissected.  The cystic duct/gallbladder junction was identified.  The cystic duct was dissected about 1 cm.  The cystic artery coursing up from the gallbladder wall and Calots triangle was identified and divided between clips.  The distal gallbladder was dissected 360 degrees.  When the anatomy was cleared, the cystic duct was clipped proximally, clipped distally, and divided.  The gallbladder was then dissected free from its bed using hook and spatula cautery and removed from the through the umbilicus.  The operative site was inspected for hemostasis.  Trocars were removed under direct vision. All CO2 was evacuated from the peritoneal cavity.  The pursestring was secured in the umbilicus.  Skin incisions were closed with interrupted subcuticular 4-0 Monocryl and Dermabond.  Sponge, needle, and instrument counts were correct.  The patient was taken to the recovery room in good condition having tolerated the procedure well. DD:  09/10/00 TD:  09/10/00 Job: [id number]

## 2010-08-24 NOTE — Discharge Summary (Signed)
NAME:  Kayla [name], Kayla [name] NO.:  [id number]   MEDICAL RECORD NO.:  [id number]                   PATIENT TYPE:  INP   LOCATION:  0349                                 FACILITY:  [facility]   PHYSICIAN:  Rene Paci, M.D. [facility]          DATE OF BIRTH:  12/17/1945   DATE OF ADMISSION:  07/05/2003  DATE OF DISCHARGE:                                 DISCHARGE SUMMARY   DISCHARGE DIAGNOSES:  1. Angioedema of tongue believed secondary to ACE inhibitor, less likely     NSAIDs; cannot exclude localized swelling secondary to mouth ulceration,     improved.  Continue steroid taper plus antihistamines.  2. Right lateral tongue laceration likely trauma, okay to continue empiric     antibiotics plus chlorhexidine rinses, outpatient followup with general     medicine approximately two weeks.  ENT followup one week.  3. Hypertension.  4. Hypothyroidism.  5. History of gastroesophageal reflux disease.   DISCHARGE MEDICATIONS:  1. Discontinuation of Prinivil.  2. Prednisone steroid taper 60 mg x1 day and then 50 mg x1 day, etc.  3. Penicillin VK 500 mg p.o. q.i.d. x7 additional days.  4. Benadryl 50 mg p.o. q.6 h.  5. Chlorhexidine rinse 2 teaspoons swish and spit t.i.d. x7 days.  6. Tylenol #3, 1-2 every 4h. as needed for pain.   Other medications are as prior to admission and include:  1. Synthroid 175 mcg p.o. q.d.  2. Protonix 40 mg p.o. q.d.  3. Tiazac 240 mg q.h.s.   FOLLOW UP:  1. Hospital followup is to be arranged by the patient first with the ENT     doctor Monday for approximately one week.  2. Her private dentist, Dr. Manson Passey, in approximately two weeks.  3. Her primary care physician, Dr. Lutricia Horsfall, has been arranged for April 18     at 11 a.m.   CONDITION ON DISCHARGE:  Medically stable and improving.   BRIEF HOSPITAL COURSE BY PROBLEM:  #1.  ANGIOEDEMA/TONGUE SWELLING.  The  patient is a 65 year old woman who saw her primary care physician on the day  of admission secondary to ulcers on the inside of her mouth.  Please see  full dictation H&P for details over the several weeks which lead to this  condition.  After discussion on the phone with ENT due to concern for  angioedema, the patient was referred for hospitalization and evaluation.  Dr. Lenard Forth did not feel that was any impending pharyngeal edema or  respiratory compromise but agreed with empiric penicillin and IV Solu-  Medrol.  She was also given Benadryl for antihistamine effects.  She was  also evaluated by Dr. Kristin Bruins of hospital dental medicine who agreed that  her oral irritation was most likely traumatic from swelling rather than  other infectious etiology but agreed with empiric antibiotic steroids and  chlorhexidine rinses.  Outpatient followup with her dentist to  be evaluated  with recommended biopsy of the ulceration if this did  not heal in the next 1-2 weeks.  The patient understands these plans, is  breathing well, tolerating regular food and drink and feels stable for  discharge home.  Other medications are as prior to admission without change  except for the discontinuation of her Prinivil.                                               Rene Paci, M.D. [facility]    VL/MEDQ  D:  07/07/2003  T:  07/07/2003  Job:  [id number]

## 2010-08-24 NOTE — H&P (Signed)
NAME:  Kayla [name], Kayla [name] NO.:  [id number]   MEDICAL RECORD NO.:  [id number]                   PATIENT TYPE:  INP   LOCATION:  0349                                 FACILITY:  [facility]   PHYSICIAN:  Angelena Sole, M.D. [facility]              DATE OF BIRTH:  17-Feb-1946   DATE OF ADMISSION:  07/05/2003  DATE OF DISCHARGE:                                HISTORY & PHYSICAL   CHIEF COMPLAINT:  Ulcers on inside of mouth.   HISTORY OF PRESENT ILLNESS:  The patient is a 65 year old white female who  gives a vague history, and then fills in the details more once prompted who  was seen about six weeks ago at the Urgent Care for sinusitis, was placed on  a Z-Pak, got some better, then a week later she had some ear pain.  She went  back to Urgent Care, was diagnosed with otitis media, placed on antibiotics  of which she did not know the name.  We called the Urgent Care and found out  she was on Levaquin 750 mg x5 days.  She then called the Urgent Care on  June 19, 2003, complaining of pain, and was given a few Tylenol No. 3.  She  then called them again on June 30, 2003, complaining of yeast in her  mouth.  She was placed on Diflucan 100 mg x7 days.  The patient complains  that about a week ago or so she noticed some ulcers in her mouth and had  some burning on the inside of her mouth.  According to her, went to the  dentist who told her that she had some sharp teeth and was biting on her  tongue, told her to use some peroxide water rinses and a mouth rinse.  However, upon further questioning, the patient did not fill the mouth rinse,  she wanted to try the hydrogen peroxide first, noticed that nothing was  helping.  She was continuing to get worse.  This past weekend she was with  her family, and noted that she was having some dyspnea on exertion, heavy  breathing, loud snoring, and some gurgling.  Her family told her that  they could hear her breathing in the next room, and  told her that she should  go to the doctor.  She complains that for the past month she has been very  tired, sleeping a lot, and then just started with the dyspnea on exertion.   PAST MEDICAL HISTORY:  1. IBS.  2. Hiatal hernia.  3. GERD.  4. Hypothyroidism.  5. Hypertension.  6. Microscopic hematuria.  7. Homocystinemia.  8. B12 deficiency.  9. She had a normal stress Cardiolite on March 27, 2002.   PAST SURGICAL HISTORY:  1. Fracture of her C-spine with a titanium plate.  2. Cesarean section.  3. Cholecystectomy.  4. She had an EGD  with esophageal stricture dilation on May 26, 2002,     and a normal colonoscopy on May 19, 2002.   ALLERGIES:  No known drug allergies.   MEDICATIONS:  1. Synthroid 0.175 mg.  2. Prinivil 20 mg.  3. Calcium.  4. Protonix 40 mg.  5. Cardizem LA 180 mg.  6. Iron.  7. Folic acid.  8. Prozac 40 mg.  9. Multivitamin.  10.      Ibuprofen up to 12 tablets daily.  11.      B12 injections every month.  12.      Zyrtec p.r.n.   FAMILY HISTORY:  Positive for coronary artery disease.   SOCIAL HISTORY:  The patient is married.  She watches her grandson.  Never  smoked.   REVIEW OF SYSTEMS:  HEAD:  No headache, dizziness, syncope.  ENT:  Sinusitis  is better, see HPI.  CARDIAC:  Negative.  GASTROINTESTINAL:  Negative.  GENITOURINARY:  Negative.  ENDOCRINE:  The patient has been sleeping a lot  for the past month.  Her husband thinks she may possibly have sleep apnea.  She was originally scheduled for a sleep study over a year ago, and he did  not think it was necessary.  Now, he is beginning to question if she has it.  PSYCHIATRIC:  The patient is controlled on Prozac.   PHYSICAL EXAMINATION:  VITAL SIGNS:  Weight 233.5 pounds, pulse 83,  respirations 22, blood pressure 140/92.  GENERAL:  The patient is an obese white female in no acute distress,  however, she is talking funny in a voice with a thick tongue.  HEENT:  Conjunctivae  are not injected.  Pupils equal, round, reactive to  light.  Extraocular movements were intact.  Questionable pterygium, left  lateral versus other process.  Tympanic membranes are gray with good  landmarks bilaterally.  Nares not injected.  Oropharynx:  Tongue is beefy  red, swollen, ulcers on the lateral size with a large one on the right  inferior surface.  She also has ulcers on the buccal mucosa, has a gurgling  sound in the back of her throat, cannot visualize the posterior pharynx  secondary to the enlarged tongue and a strong gag reflex.  Gums are red and  slightly swollen.  Lips are not really swollen.  Eyelids no edema.  Sinuses  are nontender to percussion.  NECK:  Supple, no thyromegaly, no lymphadenopathy.  HEART:  Regular rate and rhythm, S1 and S2 positive.  Positive 1/6 systolic  murmur.  LUNGS:  Clear to auscultation and percussion bilaterally.  EXTREMITIES:  No edema.  Dorsalis pedis and radial pulses are 2+  bilaterally.  ABDOMEN:  Bowel sounds positive, soft, nontender, nondistended, no  hepatosplenomegaly.  LYMPH NODES:  No nodes in the neck or axilla.  Pulse ox is 97% on room air.   IMPRESSION:  1. Glossal edema with ulcers and potential airway compromise probably     secondary to ACE inhibitors, and discussed with Dr. Lenard Forth.  He agrees we     should admit the patient.  He will see her in consult in the hospital.  I     tried to call the oral surgeon on call at [facility], but have not     gotten a call back.  We will begin the patient on steroids IV, we will     check a CBC, chest x-ray, oxygen as needed, 24 hour pulse oximetry in     case the patient desaturates, we  will check a chest x-ray, PA and     lateral, to rule out other processes in the lungs.  Albuterol per     nebulizer as needed, if needed.  Benadryl 50 mg q.6h.  Discontinue     Prinivil.  Advised the patient never to take it     again. 2. Hypertension.  Continue Cardizem LA.  Need to monitor  blood pressures.     May or may not need another agent.  3. Other medical problems are stable at the time.                                               Angelena Sole, M.D. [facility]    AMK/MEDQ  D:  07/05/2003  T:  07/05/2003  Job:  [id number]

## 2010-08-24 NOTE — Consult Note (Signed)
NAME:  Kayla, [name] NO.:  [id number]   MEDICAL RECORD NO.:  [id number]                   PATIENT TYPE:  OBV   LOCATION:  0349                                 FACILITY:  [facility]   PHYSICIAN:  Charlynne Pander, D.D.S.          DATE OF BIRTH:  Mar 10, 1946   DATE OF CONSULTATION:  07/06/2003  DATE OF DISCHARGE:  07/07/2003                                   CONSULTATION   REFERRING PHYSICIAN:  Angelena Sole, M.D.   HISTORY OF PRESENT ILLNESS:  Kayla [name] is a 65 year old female referred by  Dr. Lutricia Horsfall for a dental consultation.  The patient was admitted with a  history of tongue swelling and edema and oral ulcerations.  The patient was  evaluated by Dr. Lenard Forth in Ear, Nose, and Throat physicians for evaluation of  the potential respiratory failure.  Dental consultation was requested to  evaluate the oral ulcerations present on the right lateral tongue area.   PAST MEDICAL HISTORY:  1. Glossal edema - reason for this admission.     a. Possibly felt to be related to the Prinivil medication.     b. The patient was treated with Solu-Medrol and Benadryl with resolution        of glossal edema.  2. History of irritable bowel syndrome.  3. History of hiatal hernia and gastroesophageal reflux disorder.  4. Hypothyroidism.  5. Hypertension.  6. Microscopic hematuria.  7. History of Vitamin B12 deficiency.  8. History of chronic fatigue syndrome.  9. Status post fracture of C-spine with titanium plating.  10.      Status post cholecystectomy.  11.      Status post cesarean section.  12.      Status post esophageal stricture dilatation on May 26, 2002.   ALLERGIES/ADVERSE DRUG REACTIONS:  None known.  PRINIVIL causing angioedema.   MEDICATIONS:  1. Synthroid 125 mcg daily.  2. Solu-Medrol  125 mg IV every six hours.  3. Protonix 40 mg daily.  4. Diltiazem 180 mg at bedtime.  5. Benadryl 50 mg every six hours.  6. Ventolin inhalation therapy every  four hours as needed.  7. Penicillin 500 mg four times daily.   DENTAL HISTORY:   CHIEF COMPLAINT:  Dental consultation requested for evaluation of right  lateral tongue ulceration.   HISTORY OF PRESENT ILLNESS:  The patient gives a history of right lateral  tongue ulceration present on the right side for approximately one month or  so.  The patient saw her regular dentist (Dr. Harvel Ricks) in [place],  approximately June 27, 2003.  Dr. Manson Passey felt that this was probably  traumatic irritation to the right lateral tongue and proceeded with  adjustment of the crown restorations on the mandibular right quadrant.  The  patient indicates that there has been some relief of this ulceration but the  ulceration is still present.  The patient  was instructed to use peroxide and  a warm salt water rinse as indicated.  The patient also was prescribed  Peridex which she has not used until this time.   The patient indicates that she is seen at least one time a year for an exam  and cleaning.  The patient indicates that she has $30,000 worth of crown  and bridge therapy in her mouth.  The patient denies presence of an  occlusal splint therapy.   DENTAL EXAMINATION:  GENERAL:  The patient is a well-developed, well-  nourished female in no acute distress.  VITAL SIGNS:  Blood pressure is 154/87, pulse __________at 103, temperature  is 99.4, respirations are 20.  HEENT/NECK:  There is no significant submandibular lymphadenopathy noted at  this time.  INTRAORAL EXAMINATION:  Patient with some glossal edema appearing to be a  possible angioedema associated with the possible Prinivil therapy.  There is  a 1 cm x 8 mm right lateral tongue ulceration which lines up with the  occlusion associated with teeth #29, 30, and 31 on the lingual aspect.  Palpation of the crown restorations on that lower right lingual aspect  reveals some sharp edges specifically associated with the molar #30.  The  patient also  has large multilobular mandibular torae.  Patient with a  significant gag reflex.  The patient's dentition is generally intact with  multiple crown and bridge restorations.  These appear to be clinically  acceptable.  The oral hygiene shows some sign of plaque accumulation.   ASSESSMENT:  1. Glossal edema - most likely associated with Prinivil or after querying     the patient, this also could be due to extensive use of nonsteroidal anti-     inflammatory (ibuprofen).  The patient indicates that the edema is     resolving with time.  The patient will be continued to be followed by     Ear, Nose, and Throat and her general physician, Dr. Ruthine Dose.  2. Right lateral tongue ulceration.  This most likely is due to traumatic     irritation from the crown and bridge therapy on the lower right quadrant.     This has been previously adjusted by her general dentist (Dr. Harvel Ricks).  Suggested patient follow up with Dr. Harvel Ricks in approximately     two weeks.  If at that time the tongue ulcerations are not healed,     suggest referral to an oral surgeon for biopsy as indicated.  In the     meantime, the patient is to continue oral saline rinses and the use of     the Chlorhexidine rinses three times daily.  This should aid in     disinfection of the oral cavity and help resolve the oral ulceration.     The patient may also benefit from fabrication of a thick, soft mouth     guard and possibly an occlusal splint therapy with a  hard acrylic     material.  The patient currently refuses this treatment of the soft mouth     guard at this time.  The patient will follow up with her general dentist     for possible treatment options and definitive therapies as indicated.     The patient also may benefit from further adjustment of the lingual     aspects of the crown restorations along with significant polishing of the     porcelain of these crown bridge restorations. 3.  Plaque accumulations - The  patient was requested to increase oral hygiene     measures to help prevent further infection in her mouth.   PLAN/RECOMMENDATIONS:  1. The patient is to follow up with her general dentist (Dr. Harvel Ricks) in     approximately two weeks.  The patient is to contact dental medicine if     she is having problems arranging this appointment.  Will send copy of     consultation to Dr. Harvel Ricks at this time.  2. Will consider obtaining a panoramic x-ray to rule out possible dental     infection, although this is very remote.  Will confirm the patient's     desire to proceed with this panoramic x-ray at this time.  3. Will initiate Chlorhexidine rinses three times daily and use of oral     saline rinses at this time.  4. Description of findings with Dr. Lenard Forth and Dr. Ruthine Dose as indicated.                                               Charlynne Pander, D.D.S.    RFK/MEDQ  D:  07/07/2003  T:  07/07/2003  Job:  [id number]   cc:   Alfonse Flavors, M.D.  [address]  [place]  [place] [address]  Fax: [phone]   Angelena Sole, M.D. [facility]   Charlynne Pander, D.D.S.  [facility]  [address]  [place]  [place] [address]  Fax: [phone]

## 2010-08-28 ENCOUNTER — Encounter: Payer: Self-pay | Admitting: *Deleted

## 2010-08-28 NOTE — Telephone Encounter (Signed)
Unable to reach pt to verify strength of synthroid.

## 2010-08-28 NOTE — Telephone Encounter (Signed)
Will Send.

## 2010-08-28 NOTE — Telephone Encounter (Signed)
Send a letter , needs to call ASAP

## 2010-08-30 MED ORDER — LEVOTHYROXINE SODIUM 100 MCG PO TABS: ORAL_TABLET | ORAL | Status: DC

## 2010-08-30 NOTE — Telephone Encounter (Signed)
Addended by: Army Fossa on: 08/30/2010 04:53 PM   Modules accepted: Orders

## 2010-08-30 NOTE — Telephone Encounter (Signed)
Pt is taking 125 mcg 2 by mouth daily- changed dosage.

## 2010-09-08 NOTE — Progress Notes (Signed)
This encounter was created in error - please disregard.

## 2010-11-07 ENCOUNTER — Other Ambulatory Visit: Payer: Self-pay | Admitting: Internal Medicine

## 2010-11-07 MED ORDER — DILTIAZEM HCL ER COATED BEADS 360 MG PO TB24: 360.0000 mg | ORAL_TABLET | Freq: Every day | ORAL | Status: DC

## 2010-11-07 NOTE — Telephone Encounter (Signed)
Sent in

## 2010-11-27 ENCOUNTER — Other Ambulatory Visit: Payer: Self-pay | Admitting: Internal Medicine

## 2010-11-27 MED ORDER — ZOLPIDEM TARTRATE 10 MG PO TABS: 10.0000 mg | ORAL_TABLET | Freq: Every evening | ORAL | Status: DC | PRN

## 2010-11-27 NOTE — Telephone Encounter (Signed)
Zolpidem request [last refill 08/02/10 #30X3]

## 2010-11-27 NOTE — Telephone Encounter (Signed)
Rx Done . 

## 2010-11-27 NOTE — Telephone Encounter (Signed)
Ok #30, 3 RF 

## 2011-01-01 ENCOUNTER — Other Ambulatory Visit: Payer: Self-pay | Admitting: Internal Medicine

## 2011-01-03 LAB — POCT I-STAT, CHEM 8
BUN: 14
Calcium, Ion: 1.17
Chloride: 110
Creatinine, Ser: 1.2
Glucose, Bld: 87
HCT: 39
Hemoglobin: 13.3
Potassium: 3.6
Sodium: 141
TCO2: 24

## 2011-01-03 LAB — CBC
HCT: 37.6
Hemoglobin: 13
MCHC: 34.7
MCV: 88.3
Platelets: 400
RBC: 4.25
RDW: 15
WBC: 10.6 — ABNORMAL HIGH

## 2011-01-03 LAB — DIFFERENTIAL
Basophils Absolute: 0.1
Basophils Relative: 1
Eosinophils Absolute: 0.3
Eosinophils Relative: 3
Lymphocytes Relative: 36
Lymphs Abs: 3.9
Monocytes Absolute: 0.8
Monocytes Relative: 7
Neutro Abs: 5.6
Neutrophils Relative %: 53

## 2011-01-03 LAB — POCT CARDIAC MARKERS
CKMB, poc: 3.6
Myoglobin, poc: 136
Operator id: 277751
Troponin i, poc: <0.05

## 2011-01-03 LAB — D-DIMER, QUANTITATIVE (NOT AT ARMC): D-Dimer, Quant: 0.81 — ABNORMAL HIGH

## 2011-01-04 LAB — LIPID PANEL
Cholesterol: 281 — ABNORMAL HIGH
HDL: 30 — ABNORMAL LOW
LDL Cholesterol: 207 — ABNORMAL HIGH
Total CHOL/HDL Ratio: 9.4
Triglycerides: 221 — ABNORMAL HIGH
VLDL: 44 — ABNORMAL HIGH

## 2011-01-04 LAB — CK TOTAL AND CKMB (NOT AT ARMC)
CK, MB: 4.3 — ABNORMAL HIGH
CK, MB: 4.6 — ABNORMAL HIGH
Relative Index: 2.3
Relative Index: 2.4
Total CK: 189 — ABNORMAL HIGH
Total CK: 191 — ABNORMAL HIGH

## 2011-01-04 LAB — APTT: aPTT: 32

## 2011-01-04 LAB — T3, FREE: T3, Free: 1.5 — ABNORMAL LOW (ref 2.3–4.2)

## 2011-01-04 LAB — TROPONIN I
Troponin I: 0.01
Troponin I: <0.01

## 2011-01-04 LAB — TSH: TSH: 150.097 — ABNORMAL HIGH

## 2011-01-04 LAB — T4, FREE: Free T4: 0.48 — ABNORMAL LOW

## 2011-01-04 LAB — PROTIME-INR
INR: 0.9
Prothrombin Time: 11.9

## 2011-01-14 ENCOUNTER — Other Ambulatory Visit: Payer: Self-pay | Admitting: Internal Medicine

## 2011-01-14 MED ORDER — DILTIAZEM HCL ER COATED BEADS 360 MG PO TB24: 360.0000 mg | ORAL_TABLET | Freq: Every day | ORAL | Status: DC

## 2011-01-14 NOTE — Telephone Encounter (Signed)
Done

## 2011-01-17 ENCOUNTER — Other Ambulatory Visit: Payer: Self-pay | Admitting: Internal Medicine

## 2011-01-18 NOTE — Telephone Encounter (Signed)
Done

## 2011-01-18 NOTE — Telephone Encounter (Signed)
Ok 1 months supply, nn RF, please schedule an office visit

## 2011-02-21 ENCOUNTER — Ambulatory Visit: Payer: BLUE CROSS/BLUE SHIELD | Admitting: Internal Medicine

## 2011-02-25 ENCOUNTER — Ambulatory Visit (INDEPENDENT_AMBULATORY_CARE_PROVIDER_SITE_OTHER): Payer: BLUE CROSS/BLUE SHIELD | Admitting: Internal Medicine

## 2011-02-25 ENCOUNTER — Encounter: Payer: Self-pay | Admitting: Internal Medicine

## 2011-02-25 VITALS — BP 120/80 | HR 79 | Temp 98.2°F | Ht 64.75 in | Wt 232.0 lb

## 2011-02-25 DIAGNOSIS — E785 Hyperlipidemia, unspecified: Secondary | ICD-10-CM

## 2011-02-25 DIAGNOSIS — E538 Deficiency of other specified B group vitamins: Secondary | ICD-10-CM

## 2011-02-25 DIAGNOSIS — E039 Hypothyroidism, unspecified: Secondary | ICD-10-CM

## 2011-02-25 DIAGNOSIS — D649 Anemia, unspecified: Secondary | ICD-10-CM

## 2011-02-25 DIAGNOSIS — R002 Palpitations: Secondary | ICD-10-CM

## 2011-02-25 DIAGNOSIS — I10 Essential (primary) hypertension: Secondary | ICD-10-CM

## 2011-02-25 DIAGNOSIS — J45909 Unspecified asthma, uncomplicated: Secondary | ICD-10-CM

## 2011-02-25 MED ORDER — ZOLPIDEM TARTRATE 10 MG PO TABS: 10.0000 mg | ORAL_TABLET | Freq: Every evening | ORAL | Status: DC | PRN

## 2011-02-25 MED ORDER — DILTIAZEM HCL ER COATED BEADS 360 MG PO TB24: 360.0000 mg | ORAL_TABLET | Freq: Every day | ORAL | Status: DC

## 2011-02-25 MED ORDER — LEVOTHYROXINE SODIUM 100 MCG PO TABS: ORAL_TABLET | ORAL | Status: DC

## 2011-02-25 MED ORDER — SIMVASTATIN 20 MG PO TABS: 20.0000 mg | ORAL_TABLET | Freq: Every day | ORAL | Status: DC

## 2011-02-25 MED ORDER — CYANOCOBALAMIN 1000 MCG/ML IJ SOLN
1000.0000 ug | Freq: Once | INTRAMUSCULAR | Status: AC
  Administered 2011-02-25: 1000 ug via INTRAMUSCULAR

## 2011-02-25 NOTE — Assessment & Plan Note (Signed)
Labs, compliance encouraged  Shot after labs

## 2011-02-25 NOTE — Assessment & Plan Note (Signed)
Declined flu shot

## 2011-02-25 NOTE — Assessment & Plan Note (Signed)
rec to restart statins. Pt agreed

## 2011-02-25 NOTE — Progress Notes (Signed)
  Subjective:    Patient ID: Kayla [name], female    DOB: Mar 09, 1946, 65 y.o.   MRN: [id number]  HPI Routine office visit, has not been living  In [place] for the last few months, she just came back  Past Medical History  Diagnosis Date  . Asthma   . GERD (gastroesophageal reflux disease)   . Hypertension   . Hypothyroid   . Chronic LBP   . B12 deficiency   . IBS (irritable bowel syndrome)   . CAD (coronary artery disease) 10/2007    abnormal stress test and nonobstructive CAD per cardiac cath performed on October 22, 2007, per Dr. Casimiro Needle Copper  . Hyperlipemia       Review of Systems self discontinue simvastatin Did not take B12 shot as recommended Went to the ER once with palpitations, was told she had PVCs and also low potassium. I went through the hospital records and the last time she was seen was December 2011. Denies any syncope, chest pain, shortness of breath or diaphoresis.     Objective:   Physical Exam  Constitutional: She is oriented to person, place, and time. She appears well-developed and well-nourished.  Cardiovascular: Normal rate, regular rhythm and normal heart sounds.   No murmur heard. Pulmonary/Chest: Effort normal and breath sounds normal. No respiratory distress. She has no wheezes. She has no rales.  Musculoskeletal: She exhibits no edema.  Neurological: She is alert and oriented to person, place, and time.       Assessment & Plan:

## 2011-02-25 NOTE — Assessment & Plan Note (Signed)
EKG today essentially normal. Chronic issue, no red flag symptoms, observation

## 2011-02-25 NOTE — Assessment & Plan Note (Signed)
No change 

## 2011-02-25 NOTE — Assessment & Plan Note (Signed)
labs

## 2011-02-25 NOTE — Patient Instructions (Signed)
Kayla [name] , please give a B12 shot after she gets her labs

## 2011-02-26 ENCOUNTER — Other Ambulatory Visit: Payer: Self-pay | Admitting: Internal Medicine

## 2011-02-26 LAB — BASIC METABOLIC PANEL
BUN: 15 mg/dL (ref 6–23)
CO2: 26 mEq/L (ref 19–32)
Calcium: 9.2 mg/dL (ref 8.4–10.5)
Chloride: 109 mEq/L (ref 96–112)
Creatinine, Ser: 1 mg/dL (ref 0.4–1.2)
GFR: 59.69 mL/min — ABNORMAL LOW (ref >60.00)
Glucose, Bld: 80 mg/dL (ref 70–99)
Potassium: 3.9 mEq/L (ref 3.5–5.1)
Sodium: 142 mEq/L (ref 135–145)

## 2011-02-26 LAB — VITAMIN B12: Vitamin B-12: 78 pg/mL — ABNORMAL LOW (ref 211–911)

## 2011-02-26 LAB — TSH: TSH: 1.25 u[IU]/mL (ref 0.35–5.50)

## 2011-02-26 MED ORDER — DILTIAZEM HCL ER COATED BEADS 360 MG PO TB24: 360.0000 mg | ORAL_TABLET | Freq: Every day | ORAL | Status: DC

## 2011-02-26 NOTE — Telephone Encounter (Signed)
Rx done. 

## 2011-03-06 ENCOUNTER — Other Ambulatory Visit: Payer: Self-pay | Admitting: Internal Medicine

## 2011-04-11 ENCOUNTER — Other Ambulatory Visit: Payer: Self-pay | Admitting: Internal Medicine

## 2011-04-11 NOTE — Telephone Encounter (Signed)
Please advise Ambien refill?

## 2011-04-15 MED ORDER — ZOLPIDEM TARTRATE 10 MG PO TABS: 10.0000 mg | ORAL_TABLET | Freq: Every evening | ORAL | Status: DC | PRN

## 2011-04-15 NOTE — Telephone Encounter (Signed)
Addended by: Candie Echevaria L on: 04/15/2011 06:04 PM   Modules accepted: Orders

## 2011-04-15 NOTE — Telephone Encounter (Signed)
Pt left VM that she requested med several days ago but has yet to hear anything. Pls advise 02-25-11 Last OV, last refilled #30

## 2011-04-15 NOTE — Telephone Encounter (Signed)
Rx sent to pharmacy tried to call Pt to informed her, unable to leave VM on home or cell.

## 2011-05-13 ENCOUNTER — Telehealth: Payer: Self-pay | Admitting: Internal Medicine

## 2011-05-13 NOTE — Telephone Encounter (Signed)
Refill request Zolpidem 10mg . OK to refill?

## 2011-05-13 NOTE — Telephone Encounter (Signed)
Denied, got #30 and 1 RF last month. Needs to use RF

## 2011-05-27 ENCOUNTER — Other Ambulatory Visit: Payer: Self-pay | Admitting: Internal Medicine

## 2011-05-27 NOTE — Telephone Encounter (Signed)
Refill request Ambien 10mg . #30 with zero refills. Last refilled on 1.7.13. OK to refill?

## 2011-05-27 NOTE — Telephone Encounter (Signed)
Called to notify pt & did not get an answer. Unable to leave a vmail.

## 2011-05-27 NOTE — Telephone Encounter (Signed)
Denied, too early

## 2011-06-11 ENCOUNTER — Emergency Department (HOSPITAL_COMMUNITY)
Admission: EM | Admit: 2011-06-11 | Discharge: 2011-06-11 | Discharge status: Absent without leave | Payer: BLUE CROSS/BLUE SHIELD | Source: Home / Self Care | Attending: Emergency Medicine | Admitting: Emergency Medicine

## 2011-07-01 ENCOUNTER — Telehealth: Payer: Self-pay | Admitting: Internal Medicine

## 2011-07-01 MED ORDER — ZOLPIDEM TARTRATE 10 MG PO TABS: 10.0000 mg | ORAL_TABLET | Freq: Every evening | ORAL | Status: DC | PRN

## 2011-07-01 NOTE — Telephone Encounter (Signed)
Refill: Zolpidem tartrate 10mg  tablet. Take 1 tablet by mouth at bedtime.

## 2011-07-01 NOTE — Telephone Encounter (Signed)
#

## 2011-07-01 NOTE — Telephone Encounter (Signed)
#  30 with 1 refill. Last refilled on 1.7.13. OK to refill?

## 2011-07-01 NOTE — Telephone Encounter (Signed)
Refill done.  

## 2011-08-21 ENCOUNTER — Other Ambulatory Visit: Payer: Self-pay | Admitting: Internal Medicine

## 2011-08-21 MED ORDER — DILTIAZEM HCL ER COATED BEADS 360 MG PO TB24: 360.0000 mg | ORAL_TABLET | Freq: Every day | ORAL | Status: DC

## 2011-08-21 NOTE — Telephone Encounter (Signed)
Refill: Matzim la 360 mg tablet. Take 1 tablet by mouth daily. Qty 30. Last fill 07-21-11

## 2011-08-21 NOTE — Telephone Encounter (Signed)
Refill done.  

## 2011-08-26 ENCOUNTER — Other Ambulatory Visit: Payer: Self-pay | Admitting: Internal Medicine

## 2011-08-26 MED ORDER — ZOLPIDEM TARTRATE 10 MG PO TABS: 10.0000 mg | ORAL_TABLET | Freq: Every evening | ORAL | Status: DC | PRN

## 2011-08-26 NOTE — Telephone Encounter (Signed)
Okay #30, no refills. Office visit before next refill. Let patient know

## 2011-08-26 NOTE — Telephone Encounter (Signed)
Refill done.  

## 2011-08-26 NOTE — Telephone Encounter (Signed)
#  30 with 1 refills. Last filled on 3.25.13. OK to refill?

## 2011-09-23 ENCOUNTER — Other Ambulatory Visit: Payer: Self-pay | Admitting: Internal Medicine

## 2011-09-23 NOTE — Telephone Encounter (Signed)
Denied, needs OV 

## 2011-09-23 NOTE — Telephone Encounter (Signed)
refill zolpidem tartrate 10MG  tablet, take one tablet by mouth at bedtime, last fill 5.20.13 NOTED no refills w/o OV  Last ov 11.19.12, no future appts.

## 2011-09-23 NOTE — Telephone Encounter (Signed)
Called pt, unable to leave a msg.   

## 2011-09-23 NOTE — Telephone Encounter (Signed)
Ok to refill 

## 2011-10-08 NOTE — Telephone Encounter (Signed)
Pt called back and is aware of med denial. Pt scheduled for f/u appt next week but would like to know if Rx can be filled before then.Please advise

## 2011-10-08 NOTE — Telephone Encounter (Signed)
Pt aware.

## 2011-10-08 NOTE — Telephone Encounter (Signed)
denied °

## 2011-10-14 ENCOUNTER — Ambulatory Visit (INDEPENDENT_AMBULATORY_CARE_PROVIDER_SITE_OTHER): Payer: BLUE CROSS/BLUE SHIELD | Admitting: Internal Medicine

## 2011-10-14 ENCOUNTER — Encounter: Payer: Self-pay | Admitting: Internal Medicine

## 2011-10-14 VITALS — BP 136/84 | HR 70 | Temp 98.1°F | Wt 227.0 lb

## 2011-10-14 DIAGNOSIS — I10 Essential (primary) hypertension: Secondary | ICD-10-CM

## 2011-10-14 DIAGNOSIS — R5383 Other fatigue: Secondary | ICD-10-CM

## 2011-10-14 DIAGNOSIS — M549 Dorsalgia, unspecified: Secondary | ICD-10-CM

## 2011-10-14 DIAGNOSIS — E538 Deficiency of other specified B group vitamins: Secondary | ICD-10-CM

## 2011-10-14 DIAGNOSIS — R5381 Other malaise: Secondary | ICD-10-CM

## 2011-10-14 DIAGNOSIS — E785 Hyperlipidemia, unspecified: Secondary | ICD-10-CM

## 2011-10-14 DIAGNOSIS — E039 Hypothyroidism, unspecified: Secondary | ICD-10-CM

## 2011-10-14 MED ORDER — LEVOTHYROXINE SODIUM 100 MCG PO TABS: 200.0000 ug | ORAL_TABLET | Freq: Every day | ORAL | Status: DC

## 2011-10-14 MED ORDER — DILTIAZEM HCL ER COATED BEADS 360 MG PO TB24: 360.0000 mg | ORAL_TABLET | Freq: Every day | ORAL | Status: DC

## 2011-10-14 MED ORDER — ZOLPIDEM TARTRATE 10 MG PO TABS: 10.0000 mg | ORAL_TABLET | Freq: Every evening | ORAL | Status: DC | PRN

## 2011-10-14 MED ORDER — CYANOCOBALAMIN 1000 MCG/ML IJ SOLN
1000.0000 ug | Freq: Once | INTRAMUSCULAR | Status: AC
  Administered 2011-10-14: 1000 ug via INTRAMUSCULAR

## 2011-10-14 NOTE — Assessment & Plan Note (Signed)
Controlled, no change, continue with Cardizem

## 2011-10-14 NOTE — Progress Notes (Signed)
  Subjective:    Patient ID: Kayla [name], female    DOB: [birth year], 66 y.o.   MRN: [id number]  HPI Routine office visit Hypothyroidism, good medication compliance B12 deficiency, has not taken shots in several months. High cholesterol, on no medications Complain of extreme fatigue, this is going on for several months. See review of systems Complained of pain between the shoulder blades, x 2 months, no radiation except that the pain may involve the neck, pain is steady, only improved when the patient lays down.  Past Medical History: Asthma GERD Hypertension Hyperlipidemia  Hypothyroidism chronic LBP B12 def IBS CAD---10-2007: abnormal stress test and nonobstructive coronary artery disease per cardiac catheterization performed on October 22, 2007, per Dr. Tonny Bollman.    Past Surgical History:  neck surgery 1998 Cholecystectomy  Family History: MI-- father 76 y/o lung ca-- uncle,non smoker leukemia-- uncle  colon ca--no breast ca--no DM--no  Social History: Married--lost husband 2009, 2 children, lives w/ her mother  tobbaco--no ETOH--no   Review of Systems Admits to snoring, could fall asleep easily . Denies chest pain or cough, occasionally short of breath.     Objective:   Physical Exam General -- alert, well-developed, and overweight appearing. No apparent distress.  HEENT --no pale or icteric  Lungs -- normal respiratory effort, no intercostal retractions, no accessory muscle use, and normal breath sounds.   Heart-- normal rate, regular rhythm, no murmur, and no gallop.   Muscle skeletal--slightly tender at the thoracic spine Extremities-- no pretibial edema bilaterally  Neurologic-- alert & oriented X3 , DTRs and strength symmetric, gait normal, no antalgic posture Psych-- Cognition and judgment appear intact. Alert and cooperative with normal attention span and concentration.  not anxious appearing and not depressed appearing.       Assessment & Plan:

## 2011-10-14 NOTE — Patient Instructions (Addendum)
Please get your x-ray at the other [place]  office located at: [address] [place], across from [facility].  Please go to the basement, this is a walk-in facility, they are open from 8:30 to 5:30 PM. Phone number [phone]. --- Tylenol  500 mg OTC 2 tabs a day every 8 hours as needed for pain Heating pad

## 2011-10-14 NOTE — Assessment & Plan Note (Signed)
Based on the last FLP she was recommended simvastatin, self discontinue, not interested in treatment at this point

## 2011-10-14 NOTE — Assessment & Plan Note (Signed)
Poor compliance for the last few years, last B12 quite low. Plan: Labs Start   weekly shots today Discuss with her the importance of good compliance

## 2011-10-14 NOTE — Assessment & Plan Note (Addendum)
Complains of pain in the thoracic spine. Tylenol, heat, x-ray. If not better will let him know

## 2011-10-14 NOTE — Assessment & Plan Note (Addendum)
Complained of severe fatigue for a few months, could be multifactorial (B12 deficiency, deconditioning, sleep apnea, others) plan: CBC B12 supplements Reassess in 2 months Consider sleep study

## 2011-10-14 NOTE — Assessment & Plan Note (Signed)
Due for labs, good compliance with Synthroid

## 2011-10-15 LAB — CBC WITH DIFFERENTIAL/PLATELET
Basophils Absolute: 0 10*3/uL (ref 0.0–0.1)
Basophils Relative: 0.2 % (ref 0.0–3.0)
Eosinophils Absolute: 0.3 10*3/uL (ref 0.0–0.7)
Eosinophils Relative: 2.4 % (ref 0.0–5.0)
HCT: 38.6 % (ref 36.0–46.0)
Hemoglobin: 12.3 g/dL (ref 12.0–15.0)
Lymphocytes Relative: 30.5 % (ref 12.0–46.0)
Lymphs Abs: 3.6 10*3/uL (ref 0.7–4.0)
MCHC: 31.9 g/dL (ref 30.0–36.0)
MCV: 83.3 fl (ref 78.0–100.0)
Monocytes Absolute: 0.7 10*3/uL (ref 0.1–1.0)
Monocytes Relative: 6 % (ref 3.0–12.0)
Neutro Abs: 7.1 10*3/uL (ref 1.4–7.7)
Neutrophils Relative %: 60.9 % (ref 43.0–77.0)
Platelets: 419 10*3/uL — ABNORMAL HIGH (ref 150.0–400.0)
RBC: 4.63 Mil/uL (ref 3.87–5.11)
RDW: 15.6 % — ABNORMAL HIGH (ref 11.5–14.6)
WBC: 11.7 10*3/uL — ABNORMAL HIGH (ref 4.5–10.5)

## 2011-10-15 LAB — VITAMIN B12: Vitamin B-12: 163 pg/mL — ABNORMAL LOW (ref 211–911)

## 2011-10-15 LAB — TSH: TSH: 0.09 u[IU]/mL — ABNORMAL LOW (ref 0.35–5.50)

## 2011-10-15 LAB — VITAMIN D 25 HYDROXY (VIT D DEFICIENCY, FRACTURES): Vit D, 25-Hydroxy: 17 ng/mL — ABNORMAL LOW (ref 30–89)

## 2011-10-15 LAB — FOLATE: Folate: 19.4 ng/mL (ref >5.9)

## 2011-10-16 MED ORDER — ERGOCALCIFEROL 1.25 MG (50000 UT) PO CAPS: 50000.0000 [IU] | ORAL_CAPSULE | ORAL | Status: DC

## 2011-10-16 NOTE — Addendum Note (Signed)
Addended by: Edwena Felty T on: 10/16/2011 04:05 PM   Modules accepted: Orders

## 2011-10-18 ENCOUNTER — Ambulatory Visit (INDEPENDENT_AMBULATORY_CARE_PROVIDER_SITE_OTHER)
Admission: RE | Admit: 2011-10-18 | Discharge: 2011-10-18 | Disposition: A | Discharge status: Home / Self Care | Payer: BLUE CROSS/BLUE SHIELD | Source: Ambulatory Visit | Attending: Internal Medicine | Admitting: Internal Medicine

## 2011-10-18 DIAGNOSIS — M549 Dorsalgia, unspecified: Secondary | ICD-10-CM

## 2011-10-21 ENCOUNTER — Ambulatory Visit (INDEPENDENT_AMBULATORY_CARE_PROVIDER_SITE_OTHER): Payer: BLUE CROSS/BLUE SHIELD | Admitting: *Deleted

## 2011-10-21 DIAGNOSIS — E538 Deficiency of other specified B group vitamins: Secondary | ICD-10-CM

## 2011-10-21 MED ORDER — CYANOCOBALAMIN 1000 MCG/ML IJ SOLN
1000.0000 ug | Freq: Once | INTRAMUSCULAR | Status: AC
  Administered 2011-10-21: 1000 ug via INTRAMUSCULAR

## 2011-10-28 ENCOUNTER — Telehealth: Payer: Self-pay | Admitting: *Deleted

## 2011-10-28 ENCOUNTER — Ambulatory Visit (INDEPENDENT_AMBULATORY_CARE_PROVIDER_SITE_OTHER): Payer: BLUE CROSS/BLUE SHIELD | Admitting: *Deleted

## 2011-10-28 DIAGNOSIS — E538 Deficiency of other specified B group vitamins: Secondary | ICD-10-CM

## 2011-10-28 MED ORDER — CYANOCOBALAMIN 1000 MCG/ML IJ SOLN
1000.0000 ug | Freq: Once | INTRAMUSCULAR | Status: AC
  Administered 2011-10-28: 1000 ug via INTRAMUSCULAR

## 2011-10-28 NOTE — Telephone Encounter (Signed)
Pt came in today for her B12 injection & stated that she was continuing to have pain from her DJD. She is taking ibuprofen but she wanted to know if there was anything else that you recommend other than a narcotic. Please advise.

## 2011-10-28 NOTE — Telephone Encounter (Signed)
In addition to ibuprofen and Tylenol we can call in voltaren gel apply twice a day. If not better she can call for a  orthopedic surgical referral

## 2011-10-29 NOTE — Telephone Encounter (Signed)
LMOVM for pt to return call 

## 2011-11-01 NOTE — Telephone Encounter (Signed)
LMOVM for pt to return call 

## 2011-11-05 ENCOUNTER — Ambulatory Visit (INDEPENDENT_AMBULATORY_CARE_PROVIDER_SITE_OTHER): Payer: BLUE CROSS/BLUE SHIELD | Admitting: *Deleted

## 2011-11-05 DIAGNOSIS — E538 Deficiency of other specified B group vitamins: Secondary | ICD-10-CM

## 2011-11-05 MED ORDER — CYANOCOBALAMIN 1000 MCG/ML IJ SOLN
1000.0000 ug | Freq: Once | INTRAMUSCULAR | Status: AC
  Administered 2011-11-05: 1000 ug via INTRAMUSCULAR

## 2011-11-05 NOTE — Telephone Encounter (Signed)
Spoke with pt & she states she did not want to try the voltaren gel this time & if she changed her mind she would call back & let me know

## 2011-12-06 ENCOUNTER — Other Ambulatory Visit: Payer: Self-pay | Admitting: Internal Medicine

## 2011-12-06 MED ORDER — ZOLPIDEM TARTRATE 10 MG PO TABS: 10.0000 mg | ORAL_TABLET | Freq: Every evening | ORAL | Status: DC | PRN

## 2011-12-06 NOTE — Telephone Encounter (Signed)
Refill done.  

## 2011-12-06 NOTE — Telephone Encounter (Signed)
Ok to refill 

## 2011-12-06 NOTE — Telephone Encounter (Signed)
Refill Zolpidem Tartrate (Tab) AMBIEN 10 MG Take 1 tablet (10 mg total) by mouth at bedtime as needed.-last fill 8.6.13 Last ov 7.8.13 f/u meds mgmt

## 2011-12-06 NOTE — Telephone Encounter (Signed)
Ok #30, 1 RF 

## 2011-12-16 ENCOUNTER — Ambulatory Visit (INDEPENDENT_AMBULATORY_CARE_PROVIDER_SITE_OTHER): Payer: BLUE CROSS/BLUE SHIELD | Admitting: *Deleted

## 2011-12-16 DIAGNOSIS — E538 Deficiency of other specified B group vitamins: Secondary | ICD-10-CM

## 2011-12-16 MED ORDER — CYANOCOBALAMIN 1000 MCG/ML IJ SOLN
1000.0000 ug | Freq: Once | INTRAMUSCULAR | Status: AC
  Administered 2011-12-16: 1000 ug via INTRAMUSCULAR

## 2012-01-21 ENCOUNTER — Other Ambulatory Visit: Payer: Self-pay

## 2012-01-21 NOTE — Telephone Encounter (Signed)
Denied, was due for a visit 12/15/2011. Schedule an appointment

## 2012-01-21 NOTE — Telephone Encounter (Signed)
Pt states feel like she getting a bladder infection. Scheduled her in a slot under F/U on Friday at 2:45pm. Pt also states need refill wants Ambien Cr instead of the one she usually gets because she is going to sleep but not sleeping all night. Plz advise     MW

## 2012-01-22 ENCOUNTER — Other Ambulatory Visit: Payer: Self-pay | Admitting: Internal Medicine

## 2012-01-22 NOTE — Telephone Encounter (Signed)
Called pt to advise refill denied need to call schedule OV.    MW

## 2012-01-23 NOTE — Telephone Encounter (Signed)
Requesting Vit D2. Last filled 10/16/11 #12 no refills .Labs/ OV  done  10/14/11 does pt need more labs before refill?  Plz advise   MW

## 2012-01-24 ENCOUNTER — Ambulatory Visit (INDEPENDENT_AMBULATORY_CARE_PROVIDER_SITE_OTHER): Payer: BLUE CROSS/BLUE SHIELD | Admitting: Internal Medicine

## 2012-01-24 VITALS — BP 134/76 | HR 66 | Temp 97.9°F | Wt 228.0 lb

## 2012-01-24 DIAGNOSIS — E538 Deficiency of other specified B group vitamins: Secondary | ICD-10-CM

## 2012-01-24 DIAGNOSIS — R5383 Other fatigue: Secondary | ICD-10-CM

## 2012-01-24 DIAGNOSIS — E559 Vitamin D deficiency, unspecified: Secondary | ICD-10-CM

## 2012-01-24 DIAGNOSIS — G4700 Insomnia, unspecified: Secondary | ICD-10-CM

## 2012-01-24 DIAGNOSIS — R5381 Other malaise: Secondary | ICD-10-CM

## 2012-01-24 DIAGNOSIS — E039 Hypothyroidism, unspecified: Secondary | ICD-10-CM

## 2012-01-24 DIAGNOSIS — N390 Urinary tract infection, site not specified: Secondary | ICD-10-CM | POA: Insufficient documentation

## 2012-01-24 LAB — POCT URINALYSIS DIPSTICK
Glucose, UA: NEGATIVE
Ketones, UA: NEGATIVE
Nitrite, UA: NEGATIVE
Spec Grav, UA: 1.025
Urobilinogen, UA: 0.2
pH, UA: 6

## 2012-01-24 MED ORDER — CYANOCOBALAMIN 1000 MCG/ML IJ SOLN
1000.0000 ug | Freq: Once | INTRAMUSCULAR | Status: AC
  Administered 2012-01-24: 1000 ug via INTRAMUSCULAR

## 2012-01-24 MED ORDER — CIPROFLOXACIN HCL 500 MG PO TABS: 500.0000 mg | ORAL_TABLET | Freq: Two times a day (BID) | ORAL | Status: DC

## 2012-01-24 MED ORDER — FLUCONAZOLE 150 MG PO TABS: 150.0000 mg | ORAL_TABLET | Freq: Once | ORAL | Status: DC

## 2012-01-24 MED ORDER — ZOLPIDEM TARTRATE ER 12.5 MG PO TBCR: 12.5000 mg | EXTENDED_RELEASE_TABLET | Freq: Every evening | ORAL | Status: DC | PRN

## 2012-01-24 NOTE — Assessment & Plan Note (Signed)
Like to change from Ambien to Ambien CR as she has a difficult time staying asleep.

## 2012-01-24 NOTE — Progress Notes (Signed)
  Subjective:    Patient ID: Kayla [name], female    DOB: 03/10/1946, 66 y.o.   MRN: [id number]  HPI Acute visit 2 weeks history of suprapubic pressure and dysuria. Some back pain. Insomnia, not staying asleep, change to Ambien CR? B12 deficiency, good compliance with  shots. Hypothyroidism, we decrease the dose of Synthroid, good compliance. Status post ergocalciferol  Past Medical History: Asthma GERD Hypertension Hyperlipidemia   Hypothyroidism chronic LBP B12 def IBS CAD---10-2007: abnormal stress test and nonobstructive coronary artery disease per cardiac catheterization performed on October 22, 2007, per Dr. Tonny Bollman.    Past Surgical History:  neck surgery 1998 Cholecystectomy  Family History: MI-- father 42 y/o lung ca-- uncle,non smoker leukemia-- uncle   colon ca--no breast ca--no DM--no  Social History: Married--lost husband 2009, 2 children, lives w/ her mother   tobbaco--no ETOH--no  Review of Systems No fever or chills No nausea, vomiting, diarrhea. No blood in the stools. Admits to a lot of stress going on at this point due to family issues. She was seen with fatigue the last time, she is doing much better.     Objective:   Physical Exam General -- alert, well-developed, and overweight appearing. No apparent distress.  abdomen-- not distended, soft, slightly tender at the lower abdomen bilaterally without mass or rebound. No CVA tenderness   Extremities-- no pretibial edema bilaterally  Neurologic-- alert & oriented X3 and strength normal in all extremities. Psych-- Cognition and judgment appear intact. Alert and cooperative with normal attention span and concentration.  not anxious appearing and not depressed appearing.       Assessment & Plan:

## 2012-01-24 NOTE — Assessment & Plan Note (Signed)
B12 was really low, good compliance with B12 shots so far. Strongly recommend to continue having them once monthly

## 2012-01-24 NOTE — Assessment & Plan Note (Signed)
Symptoms and urinalysis point to a UTI. Will send a urine culture. Start empiric Cipro

## 2012-01-24 NOTE — Patient Instructions (Addendum)
Lots of fluids, take Cipro for 5 days. Call if urinary symptoms are no better in the next 2-3 days Take over-the-counter vitamin D between 600 units to 1000 units daily. Next office visit in 3-4 months Continue with the monthly shots of B12

## 2012-01-24 NOTE — Assessment & Plan Note (Signed)
Improving, likely due to the treatment of B12 deficiency and vitamin D deficiency

## 2012-01-24 NOTE — Assessment & Plan Note (Signed)
Synthroid dose was recently adjusted, check a TSH

## 2012-01-24 NOTE — Assessment & Plan Note (Signed)
Status post ergocalciferol, now recommend to take OTC vitamin D 1000 units daily

## 2012-01-25 ENCOUNTER — Encounter: Payer: Self-pay | Admitting: Internal Medicine

## 2012-01-25 LAB — TSH: TSH: 0.895 u[IU]/mL (ref 0.350–4.500)

## 2012-01-26 LAB — URINE CULTURE: Colony Count: 35000

## 2012-03-19 NOTE — Telephone Encounter (Signed)
Ok to fill--- medicare will only let us check vita d 1x a year

## 2012-05-07 ENCOUNTER — Ambulatory Visit (INDEPENDENT_AMBULATORY_CARE_PROVIDER_SITE_OTHER): Payer: Self-pay | Admitting: *Deleted

## 2012-05-07 ENCOUNTER — Ambulatory Visit: Payer: BLUE CROSS/BLUE SHIELD

## 2012-05-07 DIAGNOSIS — E538 Deficiency of other specified B group vitamins: Secondary | ICD-10-CM

## 2012-05-07 MED ORDER — CYANOCOBALAMIN 1000 MCG/ML IJ SOLN
1000.0000 ug | Freq: Once | INTRAMUSCULAR | Status: AC
  Administered 2012-05-07: 1000 ug via INTRAMUSCULAR

## 2012-05-13 ENCOUNTER — Telehealth: Payer: Self-pay | Admitting: Internal Medicine

## 2012-05-13 MED ORDER — ZOLPIDEM TARTRATE ER 12.5 MG PO TBCR: 12.5000 mg | EXTENDED_RELEASE_TABLET | Freq: Every evening | ORAL | Status: DC | PRN

## 2012-05-13 NOTE — Telephone Encounter (Signed)
Refill: Zolpidem tart er 12.5mg  tab. Take 1 tablet by mouth at bedtime as needed for sleep.

## 2012-05-13 NOTE — Telephone Encounter (Signed)
Ok #30, 1 RF 

## 2012-05-13 NOTE — Telephone Encounter (Signed)
Ok to refill? Last OV 10.18.13 Last filled 10.18.13

## 2012-05-14 ENCOUNTER — Encounter: Payer: Self-pay | Admitting: *Deleted

## 2012-05-14 NOTE — Telephone Encounter (Signed)
Pt made aware rx & controlled substance contract is ready at front desk.  

## 2012-05-18 ENCOUNTER — Other Ambulatory Visit: Payer: Self-pay | Admitting: Internal Medicine

## 2012-05-18 NOTE — Telephone Encounter (Signed)
Refill done.  

## 2012-05-18 NOTE — Telephone Encounter (Signed)
Pt left VM stating that pharmacy contacted [place] days ago and Rx is not at pharmacy. Pt would like to know status of Rx. Called Pt back advise her of agreement, Pt indicated that this is the first she is hearing about this. Advise Pt that per documentation she was made aware on 05-14-12. Pt states that she did not speak with anyone but she did receive a VM from someone but she though they said Dr Yvone Neu and she knew that she did not know that provider. Pt made aware of agreement, and verbalized her understanding will pick Rx up.

## 2012-06-23 ENCOUNTER — Encounter: Payer: Self-pay | Admitting: Internal Medicine

## 2012-07-06 ENCOUNTER — Telehealth: Payer: Self-pay | Admitting: *Deleted

## 2012-07-06 MED ORDER — ZOLPIDEM TARTRATE 10 MG PO TABS: 10.0000 mg | ORAL_TABLET | Freq: Every evening | ORAL | Status: DC | PRN

## 2012-07-06 NOTE — Telephone Encounter (Signed)
Ok ambien 10 mg 1 po qhs prn, #30, 1 Rf Also, let pt know she is due for a ROV

## 2012-07-06 NOTE — Telephone Encounter (Signed)
Patient called triage is requesting Ambien CR be changed to Ambien regular release so that insurance will pay for it. She is also requesting refill on this. Okay to refill?

## 2012-07-06 NOTE — Telephone Encounter (Signed)
Refill done.  Pt made aware due for OV.

## 2012-08-21 ENCOUNTER — Telehealth: Payer: Self-pay | Admitting: Internal Medicine

## 2012-08-21 NOTE — Telephone Encounter (Signed)
Pt made aware rx has been faxed & she is due for an OV.

## 2012-08-21 NOTE — Telephone Encounter (Signed)
Ok to refill? Last OV 10.18.13 Last filled 2.5.14

## 2012-08-21 NOTE — Telephone Encounter (Signed)
ok #30, no refills. No further refills without of his visit. Please be sure patient knows this.

## 2012-08-24 ENCOUNTER — Other Ambulatory Visit: Payer: Self-pay | Admitting: Internal Medicine

## 2012-08-24 NOTE — Telephone Encounter (Signed)
Ok to refill? Last OV 10.18.13  Last filled 5.16.14

## 2012-08-25 NOTE — Telephone Encounter (Signed)
Spoke to pharmacy, pt still has refills left on file. Refill request sent in error.  

## 2012-08-26 ENCOUNTER — Other Ambulatory Visit: Payer: Self-pay | Admitting: Internal Medicine

## 2012-08-26 NOTE — Telephone Encounter (Signed)
Pharmacy did not receive other request. Phoned in rx.

## 2012-09-17 ENCOUNTER — Ambulatory Visit (INDEPENDENT_AMBULATORY_CARE_PROVIDER_SITE_OTHER): Payer: Medicare PPO | Admitting: Internal Medicine

## 2012-09-17 VITALS — BP 136/82 | HR 68 | Temp 98.1°F | Ht 64.5 in | Wt 234.0 lb

## 2012-09-17 DIAGNOSIS — E559 Vitamin D deficiency, unspecified: Secondary | ICD-10-CM

## 2012-09-17 DIAGNOSIS — Z0000 Encounter for general adult medical examination without abnormal findings: Secondary | ICD-10-CM

## 2012-09-17 DIAGNOSIS — F411 Generalized anxiety disorder: Secondary | ICD-10-CM

## 2012-09-17 DIAGNOSIS — E538 Deficiency of other specified B group vitamins: Secondary | ICD-10-CM

## 2012-09-17 DIAGNOSIS — I10 Essential (primary) hypertension: Secondary | ICD-10-CM

## 2012-09-17 DIAGNOSIS — E039 Hypothyroidism, unspecified: Secondary | ICD-10-CM

## 2012-09-17 MED ORDER — ZOLPIDEM TARTRATE 10 MG PO TABS: ORAL_TABLET | ORAL | Status: DC

## 2012-09-17 NOTE — Assessment & Plan Note (Signed)
BP okay, check a BMP

## 2012-09-17 NOTE — Patient Instructions (Addendum)
Please reschedule your physical exam for next month. Call me if you need help.

## 2012-09-17 NOTE — Progress Notes (Signed)
  Subjective:    Patient ID: Kayla [name], female    DOB: 01-11-1946, 67 y.o.   MRN: [id number]  HPI  She was here for a complete physical exam, as we were doing the examination, she was noted to be in distress. Her mother  Is 57 his current in a hospital In [place] doing very poorly, She was admitted 2 days ago with a number of issues. Patient is Obviously upset about this issue. Her PHQ 9 is 11, denies any suicidal ideas. Not sleeping well.     Past Medical History:  Asthma  GERD  Hypertension  Hyperlipidemia  Hypothyroidism  chronic LBP  B12 def  IBS  CAD---10-2007: abnormal stress test and nonobstructive coronary artery disease per cardiac catheterization performed on October 22, 2007, per Dr. Tonny Bollman.   Past Surgical History:  neck surgery 1998  Cholecystectomy   Family History:  MI-- father 43 y/o  lung ca-- uncle,non smoker  leukemia-- uncle  colon ca--no  breast ca--no  DM--no  Social History:  Married--lost husband 2009, 2 children, lives w/ her mother  tobbaco--no  ETOH--no   Review of Systems We review her medication list, she is taking Synthroid appropriately, has not been taking B12 shots regularly rather takes B12 supplements by mouth.     Objective:   Physical Exam General -- alert, well-developed,  + emotional distress. Marland Kitchen   Psych-- Cognition and judgment appear intact. Alert and cooperative with normal attention span and concentration. Pt is tearful, moderately anxious, mildly depress      Assessment & Plan:  Today , I spent more than 25 min with the patient, >50% of the time counseling, and listening

## 2012-09-17 NOTE — Assessment & Plan Note (Addendum)
Anxious about her mother's health, patient is counseled to the best of my ability. We discussed medications such as benzodiazepines or SSRIs. I also encouraged her to think about counseling with Raynelle Fanning . States she does not need any medication or counseling at this point, she has a strong faith and she finds all the support she needs there according to the patient. We agreed to cancel the physical exam  today except for some blood work , RTC 1 month.

## 2012-09-17 NOTE — Assessment & Plan Note (Signed)
Reports good compliance with Synthroid 100  micrograms 2 tablets daily labs

## 2012-09-17 NOTE — Assessment & Plan Note (Signed)
Not taking B12 shots rather OTC supplements by mouth. Labs.

## 2012-09-17 NOTE — Assessment & Plan Note (Signed)
Status post ergocalciferol, and now on OTC vitamin D by mouth daily.. Labs

## 2012-09-18 ENCOUNTER — Encounter: Payer: Self-pay | Admitting: Internal Medicine

## 2012-09-18 LAB — BASIC METABOLIC PANEL
BUN: 20 mg/dL (ref 6–23)
CO2: 22 mEq/L (ref 19–32)
Calcium: 9.3 mg/dL (ref 8.4–10.5)
Chloride: 109 mEq/L (ref 96–112)
Creatinine, Ser: 1 mg/dL (ref 0.4–1.2)
GFR: 56.12 mL/min — ABNORMAL LOW (ref >60.00)
Glucose, Bld: 132 mg/dL — ABNORMAL HIGH (ref 70–99)
Potassium: 3.8 mEq/L (ref 3.5–5.1)
Sodium: 142 mEq/L (ref 135–145)

## 2012-09-18 LAB — TSH: TSH: 0.2 u[IU]/mL — ABNORMAL LOW (ref 0.35–5.50)

## 2012-09-18 LAB — VITAMIN D 25 HYDROXY (VIT D DEFICIENCY, FRACTURES): Vit D, 25-Hydroxy: 35 ng/mL (ref 30–89)

## 2012-09-18 LAB — VITAMIN B12: Vitamin B-12: 153 pg/mL — ABNORMAL LOW (ref 211–911)

## 2012-09-21 ENCOUNTER — Telehealth: Payer: Self-pay | Admitting: *Deleted

## 2012-09-21 MED ORDER — LEVOTHYROXINE SODIUM 175 MCG PO TABS: 175.0000 ug | ORAL_TABLET | Freq: Every day | ORAL | Status: DC

## 2012-09-21 NOTE — Telephone Encounter (Signed)
Discussed with pt, sent rx to pharmacy, pt states she will call back to schedule appt for b-12 inj.

## 2012-09-21 NOTE — Telephone Encounter (Signed)
Message copied by Nada Maclachlan on Mon Sep 21, 2012  4:41 PM ------      Message from: Wanda Plump      Created: Sat Sep 19, 2012  7:17 PM       Call patient:      Needs to change synthroid to  175 mcg 1 by mouth daily #30 and 2 refills.      B12 is low, definitely needs shots: One shot weekly x4 then one shot monthly for life.      Vitamin D better, continue with daily OTC vitamin D ------

## 2012-10-12 ENCOUNTER — Other Ambulatory Visit: Payer: Self-pay | Admitting: Internal Medicine

## 2012-10-12 MED ORDER — ZOLPIDEM TARTRATE 10 MG PO TABS: ORAL_TABLET | ORAL | Status: DC

## 2012-10-12 NOTE — Addendum Note (Signed)
Addended by: Edwena Felty T on: 10/12/2012 05:09 PM   Modules accepted: Orders

## 2012-10-12 NOTE — Telephone Encounter (Signed)
Done

## 2012-10-12 NOTE — Telephone Encounter (Signed)
Ok to refill? Last OV 6.12.14 Last filled 6.12.14

## 2012-11-05 ENCOUNTER — Other Ambulatory Visit: Payer: Self-pay | Admitting: Internal Medicine

## 2012-11-06 NOTE — Telephone Encounter (Signed)
Refill done per orders and per protocol.

## 2012-11-06 NOTE — Telephone Encounter (Signed)
Yes, ok to Rx

## 2012-11-06 NOTE — Telephone Encounter (Signed)
Ok to refill? I believe this is a substitution for her Cardizem 360 mg.  Last OV 6.12.14 Last filled 7.8.13 for 6 months.  Attempted to contact pt. To see how she has been taking this medication. Both contact numbers are not in service.

## 2012-11-18 ENCOUNTER — Ambulatory Visit (INDEPENDENT_AMBULATORY_CARE_PROVIDER_SITE_OTHER): Payer: Medicare PPO

## 2012-11-18 DIAGNOSIS — E538 Deficiency of other specified B group vitamins: Secondary | ICD-10-CM

## 2012-11-18 MED ORDER — CYANOCOBALAMIN 1000 MCG/ML IJ SOLN
1000.0000 ug | Freq: Once | INTRAMUSCULAR | Status: AC
  Administered 2012-11-18: 1000 ug via INTRAMUSCULAR

## 2012-11-18 MED ORDER — CYANOCOBALAMIN 1000 MCG/ML IJ SOLN: 1000.0000 ug | Freq: Once | INTRAMUSCULAR | Status: DC

## 2012-11-26 ENCOUNTER — Ambulatory Visit (INDEPENDENT_AMBULATORY_CARE_PROVIDER_SITE_OTHER): Payer: Medicare PPO

## 2012-11-26 DIAGNOSIS — E538 Deficiency of other specified B group vitamins: Secondary | ICD-10-CM

## 2012-11-26 MED ORDER — CYANOCOBALAMIN 1000 MCG/ML IJ SOLN
1000.0000 ug | Freq: Once | INTRAMUSCULAR | Status: AC
  Administered 2012-11-26: 1000 ug via INTRAMUSCULAR

## 2012-12-03 ENCOUNTER — Ambulatory Visit (INDEPENDENT_AMBULATORY_CARE_PROVIDER_SITE_OTHER): Payer: Medicare PPO | Admitting: *Deleted

## 2012-12-03 DIAGNOSIS — E538 Deficiency of other specified B group vitamins: Secondary | ICD-10-CM

## 2012-12-03 MED ORDER — CYANOCOBALAMIN 1000 MCG/ML IJ SOLN
1000.0000 ug | Freq: Once | INTRAMUSCULAR | Status: AC
  Administered 2012-12-03: 1000 ug via INTRAMUSCULAR

## 2012-12-10 ENCOUNTER — Ambulatory Visit (INDEPENDENT_AMBULATORY_CARE_PROVIDER_SITE_OTHER): Payer: Medicare PPO

## 2012-12-10 DIAGNOSIS — E538 Deficiency of other specified B group vitamins: Secondary | ICD-10-CM

## 2012-12-10 MED ORDER — CYANOCOBALAMIN 1000 MCG/ML IJ SOLN
1000.0000 ug | Freq: Once | INTRAMUSCULAR | Status: AC
  Administered 2012-12-10: 1000 ug via INTRAMUSCULAR

## 2012-12-24 ENCOUNTER — Other Ambulatory Visit: Payer: Self-pay | Admitting: Internal Medicine

## 2012-12-24 NOTE — Telephone Encounter (Signed)
rx request-Ambien Last OV- 09/17/12 Last refilled 10/12/12 #30 / 2 refills UDS on file 05/19/12 moderate.  Pease advise. DJR

## 2012-12-30 NOTE — Telephone Encounter (Signed)
Pt called requesting rx for Ambien. Original request was sent from pharmacy on 12/24/12. Please advise. [facility] [address]

## 2013-01-01 ENCOUNTER — Telehealth: Payer: Self-pay | Admitting: *Deleted

## 2013-01-01 DIAGNOSIS — G4700 Insomnia, unspecified: Secondary | ICD-10-CM

## 2013-01-01 MED ORDER — ZOLPIDEM TARTRATE 10 MG PO TABS: ORAL_TABLET | ORAL | Status: DC

## 2013-01-01 NOTE — Telephone Encounter (Signed)
Pateint requesting refill on Ambien Last OV 09/17/12 Last filled 09/17/12 UDS (09/17/12)moderate risk at that time (neg <2 with cutoff 5ng/ml) I have obtained a copy of UDS results that will be scanned into the pts chart Okay to refill?

## 2013-01-01 NOTE — Telephone Encounter (Signed)
2 (-) UDS ! Ok #30, no RF UDS in 10 days. If no UDS is obtained, no further RF

## 2013-01-01 NOTE — Telephone Encounter (Signed)
Rx printed and placed on ledge for signature  

## 2013-01-04 ENCOUNTER — Telehealth: Payer: Self-pay | Admitting: *Deleted

## 2013-01-04 NOTE — Telephone Encounter (Signed)
Letter sent with Dr. Leta Jungling instructions for UDS to be done on 01/11/13 or no further refills will be done. . See letter in EMR.

## 2013-01-13 ENCOUNTER — Other Ambulatory Visit: Payer: Self-pay | Admitting: Internal Medicine

## 2013-01-13 NOTE — Telephone Encounter (Signed)
Addendum, today i had the opportunity to review a UDS from 09/17/2012,  was negative for Ambien, this is the second time she has a negative screening despite getting prescriptions. Definitely high-risk. Will send the report to be scan today.

## 2013-01-13 NOTE — Telephone Encounter (Signed)
rx refilled per protocol. DJR  

## 2013-01-26 ENCOUNTER — Ambulatory Visit: Payer: Medicare PPO

## 2013-02-09 ENCOUNTER — Ambulatory Visit (INDEPENDENT_AMBULATORY_CARE_PROVIDER_SITE_OTHER): Payer: Medicare PPO | Admitting: *Deleted

## 2013-02-09 ENCOUNTER — Telehealth: Payer: Self-pay | Admitting: *Deleted

## 2013-02-09 DIAGNOSIS — E538 Deficiency of other specified B group vitamins: Secondary | ICD-10-CM

## 2013-02-09 MED ORDER — CYANOCOBALAMIN 1000 MCG/ML IJ SOLN
1000.0000 ug | Freq: Once | INTRAMUSCULAR | Status: AC
  Administered 2013-02-09: 1000 ug via INTRAMUSCULAR

## 2013-02-09 NOTE — Telephone Encounter (Signed)
Error

## 2013-02-11 ENCOUNTER — Telehealth: Payer: Self-pay | Admitting: *Deleted

## 2013-02-11 NOTE — Telephone Encounter (Signed)
Unable to refill, had 2 high risk UDS

## 2013-02-11 NOTE — Telephone Encounter (Signed)
Attempted to contact pt to make aware that we could not longer fill the [place], voice mail was full could not leave a message.

## 2013-02-11 NOTE — Telephone Encounter (Signed)
Patient is requesting a refill on ambien. Patient states she has been in [facility] caring for her elderly mother and has been out of this medication for a while. Patient states she did receive the letter from our office that stated she needed to provide another UDS prior to additional refills. She is willing to do that but wants to know is she needs to wait until the results are available before she can pick up script.   Last OV 09/17/12 Last filled 01/01/13 #30 High Risk UDS (2 neg screens) Okay to refill is she provided UDS?

## 2013-02-12 NOTE — Telephone Encounter (Signed)
Patient called back, I advised that due to two high risk UDS results we could no longer prescribe the ambien.

## 2013-03-11 ENCOUNTER — Encounter: Payer: Self-pay | Admitting: Internal Medicine

## 2013-06-14 ENCOUNTER — Other Ambulatory Visit: Payer: Self-pay | Admitting: Internal Medicine

## 2013-06-18 ENCOUNTER — Ambulatory Visit (INDEPENDENT_AMBULATORY_CARE_PROVIDER_SITE_OTHER): Payer: Medicare PPO

## 2013-06-18 DIAGNOSIS — E538 Deficiency of other specified B group vitamins: Secondary | ICD-10-CM

## 2013-06-18 MED ORDER — CYANOCOBALAMIN 1000 MCG/ML IJ SOLN
1000.0000 ug | Freq: Once | INTRAMUSCULAR | Status: AC
  Administered 2013-06-18: 1000 ug via INTRAMUSCULAR

## 2013-07-28 ENCOUNTER — Ambulatory Visit (INDEPENDENT_AMBULATORY_CARE_PROVIDER_SITE_OTHER): Payer: Medicare PPO

## 2013-07-28 DIAGNOSIS — E538 Deficiency of other specified B group vitamins: Secondary | ICD-10-CM

## 2013-07-28 MED ORDER — CYANOCOBALAMIN 1000 MCG/ML IJ SOLN
1000.0000 ug | Freq: Once | INTRAMUSCULAR | Status: AC
  Administered 2013-07-28: 1000 ug via INTRAMUSCULAR

## 2013-09-15 ENCOUNTER — Ambulatory Visit (INDEPENDENT_AMBULATORY_CARE_PROVIDER_SITE_OTHER): Payer: Medicare PPO | Admitting: *Deleted

## 2013-09-15 DIAGNOSIS — E538 Deficiency of other specified B group vitamins: Secondary | ICD-10-CM

## 2013-09-15 MED ORDER — CYANOCOBALAMIN 1000 MCG/ML IJ SOLN
1000.0000 ug | Freq: Once | INTRAMUSCULAR | Status: AC
  Administered 2013-09-15: 1000 ug via INTRAMUSCULAR

## 2013-09-16 ENCOUNTER — Ambulatory Visit: Payer: Medicare PPO

## 2013-09-23 ENCOUNTER — Other Ambulatory Visit: Payer: Self-pay | Admitting: Internal Medicine

## 2013-11-10 ENCOUNTER — Other Ambulatory Visit: Payer: Self-pay | Admitting: Internal Medicine

## 2013-12-14 ENCOUNTER — Other Ambulatory Visit: Payer: Self-pay | Admitting: Internal Medicine

## 2014-02-05 ENCOUNTER — Other Ambulatory Visit: Payer: Self-pay | Admitting: Internal Medicine

## 2014-02-09 ENCOUNTER — Ambulatory Visit: Payer: Medicare PPO | Admitting: Medical

## 2014-03-22 ENCOUNTER — Telehealth: Payer: Self-pay | Admitting: Internal Medicine

## 2014-03-24 MED ORDER — LEVOTHYROXINE SODIUM 175 MCG PO TABS: ORAL_TABLET | ORAL | Status: DC

## 2014-03-24 NOTE — Addendum Note (Signed)
Addended by: Wilfrid Lund on: 03/24/2014 04:17 PM   Modules accepted: Orders

## 2014-03-24 NOTE — Telephone Encounter (Signed)
[facility]: 30 day supply only until seen.

## 2014-03-24 NOTE — Telephone Encounter (Signed)
Patient scheduled appointment for 04/12/14 and is out of this medication and would like enough sent to pharmacy to last her until she is seen. [facility] [address]

## 2014-03-25 ENCOUNTER — Other Ambulatory Visit: Payer: Self-pay

## 2014-03-25 MED ORDER — DILTIAZEM HCL ER COATED BEADS 360 MG PO TB24: ORAL_TABLET | ORAL | Status: DC

## 2014-03-25 NOTE — Telephone Encounter (Signed)
Informed patient of this.  °

## 2014-03-25 NOTE — Telephone Encounter (Signed)
Patient wants to know if she could have enough medication of bp med matzim to last her until her appointment. [facility] [address]

## 2014-03-25 NOTE — Telephone Encounter (Signed)
Patient would like a callback when this is been sent. Patient is out of medication

## 2014-03-25 NOTE — Telephone Encounter (Signed)
Please inform Pt, [facility]im [facility] day supply only until she is seen.

## 2014-04-12 ENCOUNTER — Ambulatory Visit: Payer: Medicare PPO | Admitting: Internal Medicine

## 2014-05-03 ENCOUNTER — Other Ambulatory Visit: Payer: Self-pay | Admitting: Internal Medicine

## 2014-05-04 NOTE — Telephone Encounter (Signed)
Patient has upcoming appointment scheduled 05/18/14.  Rx refilled until that time.

## 2014-05-18 ENCOUNTER — Ambulatory Visit (HOSPITAL_BASED_OUTPATIENT_CLINIC_OR_DEPARTMENT_OTHER)
Admission: RE | Admit: 2014-05-18 | Discharge: 2014-05-18 | Disposition: A | Discharge status: Home / Self Care | Payer: Medicare PPO | Source: Ambulatory Visit | Attending: Internal Medicine | Admitting: Internal Medicine

## 2014-05-18 ENCOUNTER — Encounter: Payer: Self-pay | Admitting: Internal Medicine

## 2014-05-18 ENCOUNTER — Ambulatory Visit (INDEPENDENT_AMBULATORY_CARE_PROVIDER_SITE_OTHER): Payer: Medicare PPO | Admitting: Internal Medicine

## 2014-05-18 VITALS — BP 127/79 | HR 79 | Temp 98.1°F | Ht 65.0 in | Wt 228.2 lb

## 2014-05-18 DIAGNOSIS — R079 Chest pain, unspecified: Secondary | ICD-10-CM | POA: Insufficient documentation

## 2014-05-18 DIAGNOSIS — E034 Atrophy of thyroid (acquired): Secondary | ICD-10-CM

## 2014-05-18 DIAGNOSIS — E538 Deficiency of other specified B group vitamins: Secondary | ICD-10-CM

## 2014-05-18 DIAGNOSIS — I2510 Atherosclerotic heart disease of native coronary artery without angina pectoris: Secondary | ICD-10-CM

## 2014-05-18 DIAGNOSIS — G4700 Insomnia, unspecified: Secondary | ICD-10-CM

## 2014-05-18 DIAGNOSIS — E559 Vitamin D deficiency, unspecified: Secondary | ICD-10-CM

## 2014-05-18 DIAGNOSIS — E038 Other specified hypothyroidism: Secondary | ICD-10-CM

## 2014-05-18 DIAGNOSIS — I517 Cardiomegaly: Secondary | ICD-10-CM | POA: Insufficient documentation

## 2014-05-18 DIAGNOSIS — I10 Essential (primary) hypertension: Secondary | ICD-10-CM | POA: Insufficient documentation

## 2014-05-18 DIAGNOSIS — R5382 Chronic fatigue, unspecified: Secondary | ICD-10-CM

## 2014-05-18 DIAGNOSIS — K589 Irritable bowel syndrome without diarrhea: Secondary | ICD-10-CM

## 2014-05-18 DIAGNOSIS — E785 Hyperlipidemia, unspecified: Secondary | ICD-10-CM

## 2014-05-18 MED ORDER — CYANOCOBALAMIN 1000 MCG/ML IJ SOLN
1000.0000 ug | Freq: Once | INTRAMUSCULAR | Status: AC
  Administered 2014-05-18: 1000 ug via INTRAMUSCULAR

## 2014-05-18 MED ORDER — NITROGLYCERIN 0.4 MG SL SUBL: 0.4000 mg | SUBLINGUAL_TABLET | SUBLINGUAL | Status: DC | PRN

## 2014-05-18 MED ORDER — DILTIAZEM HCL ER COATED BEADS 360 MG PO TB24: 360.0000 mg | ORAL_TABLET | Freq: Every day | ORAL | Status: DC

## 2014-05-18 MED ORDER — LEVOTHYROXINE SODIUM 175 MCG PO TABS
ORAL_TABLET | ORAL | Status: DC
Start: 2014-05-18 — End: 2014-06-14

## 2014-05-18 MED ORDER — ZOLPIDEM TARTRATE 10 MG PO TABS: ORAL_TABLET | ORAL | Status: DC

## 2014-05-18 NOTE — Assessment & Plan Note (Addendum)
Again complain of fatigue today, I wonder if sx related to B12 deficiency   Today she also reports she had a history of fibromyalgia. Plan: Gen. labs, restart B12 and vit d  supplements

## 2014-05-18 NOTE — Assessment & Plan Note (Signed)
Not taking any supplements, recommend OTCs  daily

## 2014-05-18 NOTE — Assessment & Plan Note (Signed)
Documented B12 deficiency, poor compliance with shots. Recommend a B12 shot weekly and then monthly, first shot today

## 2014-05-18 NOTE — Assessment & Plan Note (Signed)
Change in the bowel movement pattern, for the last 2 weeks has been very constipated. Will check a CBC to rule out anemia, recommend MiraLAX and reassess in few weeks.

## 2014-05-18 NOTE — Progress Notes (Signed)
Subjective:    Patient ID: Kayla [name], female    DOB: June 27, 1945, 69 y.o.   MRN: [id number]  DOS:  05/18/2014 Type of visit - description : rov Interval history: Hypothyroidism, reports good compliance with Synthroid Insomnia, needs a refill on Ambien B12 deficiency, not taking B12 supplements Also complained of the following: For the last several weeks, she has noted some chest tightness with physical activity, is described as a tenderness in the center of the chest with some radiation to the left shoulder, associated with shortness of breath, not associated with nausea or diaphoresis. Symptoms better after she stopped exertion.  Also, for the last 2 weeks had noted constipation, previously, she had bowel movements daily and stools were somewhat loose.    Review of Systems Reports that she is tired all the time and her fibromyalgia is "acting up" Denies any fever chills No lower extremity edema, occasional palpitations. No nausea, vomiting, blood in the stools. No recent airplane trips but she goes on and off to [place] to visit her mother. Denies any recent problems with calf swelling or pain. Admits to a lot of anxiety, her mother is 93 has Alzheimer and cancer.  Past Medical History  Diagnosis Date  . Asthma   . GERD (gastroesophageal reflux disease)   . Hypertension   . Hypothyroid   . Chronic LBP   . B12 deficiency   . IBS (irritable bowel syndrome)   . CAD (coronary artery disease) 10/2007    abnormal stress test and nonobstructive CAD per cardiac cath performed on October 22, 2007, per Dr. Legrand Como Copper  . Hyperlipemia     Past Surgical History  Procedure Laterality Date  . Neck surgery  1998  . Cholecystectomy      History   Social History  . Marital Status: Widowed    Spouse Name: N/A  . Number of Children: 2  . Years of Education: N/A   Occupational History  . Not on file.   Social History Main Topics  . Smoking status: Never Smoker   .  Smokeless tobacco: Never Used  . Alcohol Use: No  . Drug Use: Not on file  . Sexual Activity: Not on file   Other Topics Concern  . Not on file   Social History Narrative   Diet- poor   exericise- active    Lost husband 2009        Medication List       This list is accurate as of: 05/18/14 11:59 PM.  Always use your most recent med list.               aspirin 81 MG tablet  Take 81 mg by mouth daily.     diltiazem 360 MG 24 hr tablet  Commonly known as:  MATZIM LA  Take 1 tablet (360 mg total) by mouth daily.     levothyroxine 175 MCG tablet  Commonly known as:  SYNTHROID, LEVOTHROID  TAKE 1 TABLET BY MOUTH DAILY.     nitroGLYCERIN 0.4 MG SL tablet  Commonly known as:  NITROSTAT  Place 1 tablet (0.4 mg total) under the tongue every 5 (five) minutes as needed for chest pain.     VITAMIN B 12 PO  Take 1 tablet by mouth daily.     zolpidem 10 MG tablet  Commonly known as:  AMBIEN  TAKE 1 TABLET BY MOUTH AT BEDTIME AS NEEDED FOR SLEEP  Objective:   Physical Exam  Constitutional: She is oriented to person, place, and time. She appears well-developed. No distress.  HENT:  Head: Normocephalic and atraumatic.  Cardiovascular:  RRR, no murmur , rub or gallop  Pulmonary/Chest: Effort normal. No respiratory distress.  CTA B  Abdominal: Soft. Bowel sounds are normal. She exhibits no distension and no mass. There is no tenderness. There is no rebound and no guarding.  No organomegaly  Musculoskeletal: She exhibits no edema or tenderness.  Calves symmetric and not tender  Neurological: She is alert and oriented to person, place, and time. No cranial nerve deficit. She exhibits normal muscle tone. Coordination normal.  Speech normal, gait unassisted and normal for age, motor strength appropriate for age   Skin: Skin is warm and dry. No pallor.  No jaundice  Psychiatric: Her behavior is normal. Judgment and thought content normal.  Slightly anxious, at  baseline, not depressed appearing  Vitals reviewed.  BP 127/79 mmHg  Pulse 79  Temp(Src) 98.1 F (36.7 C) (Oral)  Ht 5\' 5"  (1.651 m)  Wt 228 lb 4 oz (103.534 kg)  BMI 37.98 kg/m2  SpO2 97%        Assessment & Plan:       Problem List Items Addressed This Visit      Cardiovascular and Mediastinum   Coronary atherosclerosis - Primary    Patient with a history of nonobstructive coronary artery disease presents with what sounds angina. EKG today shows sinus rhythm, no acute changes. Plan: Labs, chest x-ray, aspirin 81, nitroglycerin as needed, cardiology referral, ER if symptoms intense or persistent.      Relevant Medications   nitroGLYCERIN (NITROSTAT) SL tablet   aspirin 81 MG tablet   diltiazem (MATZIM LA) 360 MG 24 hr tablet   Other Relevant Orders   EKG 12-Lead (Completed)   CBC with Differential/Platelet   DG Chest 2 View (Completed)     Digestive   IBS    Change in the bowel movement pattern, for the last 2 weeks has been very constipated. Will check a CBC to rule out anemia, recommend MiraLAX and reassess in few weeks.      B12 deficiency    Documented B12 deficiency, poor compliance with shots. Recommend a B12 shot weekly and then monthly, first shot today      Relevant Medications   cyanocobalamin ((VITAMIN B-12)) injection 1,000 mcg (Completed)     Endocrine   Hypothyroidism    Reports good compliance with Synthroid, labs      Relevant Medications   levothyroxine (SYNTHROID, LEVOTHROID) tablet   Other Relevant Orders   TSH     Other   Hyperlipidemia    Will return fasting for labs. On no medication at this time      Relevant Medications   nitroGLYCERIN (NITROSTAT) SL tablet   aspirin 81 MG tablet   diltiazem (MATZIM LA) 360 MG 24 hr tablet   Other Relevant Orders   Comprehensive metabolic panel   Lipid panel   Fatigue    Again complain of fatigue today, I wonder if sx related to B12 deficiency   Today she also reports she had a  history of fibromyalgia. Plan: Gen. labs, restart B12 and vit d  supplements       Vitamin D deficiency    Not taking any supplements, recommend OTCs  daily      Relevant Orders   Vit D  25 hydroxy (rtn osteoporosis monitoring)   Insomnia    Refill  of Ambien      Relevant Medications   zolpidem (AMBIEN) tablet

## 2014-05-18 NOTE — Patient Instructions (Addendum)
Stop by the front desk and schedule labs to be done within few days (fasting)  Go to the first floor and get a chest x-ray  For constipation, take MiraLAX 17 g every day until you see me next  Start taking aspirin 81 mg one tablet a day If you have chest pain, take a nitroglycerin under the tongue every 5 minutes, if not better after 2 nitroglycerin call 911. Don't take more than 3 nitroglycerin in a single day.  Next B12 shot in one week  Start taking OTC vitamin D 2000 units daily  Next visit with me in 2 months We refer you to cardiology

## 2014-05-18 NOTE — Assessment & Plan Note (Signed)
Refill of Ambien

## 2014-05-18 NOTE — Assessment & Plan Note (Signed)
Reports good compliance with Synthroid, labs

## 2014-05-18 NOTE — Assessment & Plan Note (Signed)
Will return fasting for labs. On no medication at this time

## 2014-05-18 NOTE — Assessment & Plan Note (Addendum)
Patient with a history of nonobstructive coronary artery disease presents with what sounds angina. EKG today shows sinus rhythm, no acute changes. Plan: Labs, chest x-ray, aspirin 81, nitroglycerin as needed, cardiology referral, ER if symptoms intense or persistent.

## 2014-05-18 NOTE — Progress Notes (Signed)
Pre visit review using our clinic review tool, if applicable. No additional management support is needed unless otherwise documented below in the visit note. 

## 2014-05-25 ENCOUNTER — Other Ambulatory Visit: Payer: Medicare PPO

## 2014-05-25 ENCOUNTER — Ambulatory Visit: Payer: Medicare PPO

## 2014-05-25 ENCOUNTER — Encounter: Payer: Self-pay | Admitting: Internal Medicine

## 2014-06-02 ENCOUNTER — Ambulatory Visit: Payer: Medicare PPO

## 2014-06-03 ENCOUNTER — Ambulatory Visit: Payer: Medicare PPO | Admitting: Cardiology

## 2014-06-07 ENCOUNTER — Encounter: Payer: Self-pay | Admitting: Cardiology

## 2014-06-09 ENCOUNTER — Ambulatory Visit (INDEPENDENT_AMBULATORY_CARE_PROVIDER_SITE_OTHER): Payer: Medicare PPO | Admitting: *Deleted

## 2014-06-09 ENCOUNTER — Other Ambulatory Visit (INDEPENDENT_AMBULATORY_CARE_PROVIDER_SITE_OTHER): Payer: Medicare PPO

## 2014-06-09 DIAGNOSIS — E785 Hyperlipidemia, unspecified: Secondary | ICD-10-CM | POA: Diagnosis not present

## 2014-06-09 DIAGNOSIS — E559 Vitamin D deficiency, unspecified: Secondary | ICD-10-CM | POA: Diagnosis not present

## 2014-06-09 DIAGNOSIS — E538 Deficiency of other specified B group vitamins: Secondary | ICD-10-CM

## 2014-06-09 DIAGNOSIS — Z0000 Encounter for general adult medical examination without abnormal findings: Secondary | ICD-10-CM | POA: Diagnosis not present

## 2014-06-09 LAB — COMPREHENSIVE METABOLIC PANEL
ALT: 10 U/L (ref 0–35)
AST: 12 U/L (ref 0–37)
Albumin: 4.2 g/dL (ref 3.5–5.2)
Alkaline Phosphatase: 113 U/L (ref 39–117)
BUN: 25 mg/dL — ABNORMAL HIGH (ref 6–23)
CO2: 26 mEq/L (ref 19–32)
Calcium: 9.4 mg/dL (ref 8.4–10.5)
Chloride: 107 mEq/L (ref 96–112)
Creatinine, Ser: 1.28 mg/dL — ABNORMAL HIGH (ref 0.40–1.20)
GFR: 43.94 mL/min — ABNORMAL LOW (ref >60.00)
Glucose, Bld: 114 mg/dL — ABNORMAL HIGH (ref 70–99)
Potassium: 3.8 mEq/L (ref 3.5–5.1)
Sodium: 140 mEq/L (ref 135–145)
Total Bilirubin: 0.3 mg/dL (ref 0.2–1.2)
Total Protein: 7.6 g/dL (ref 6.0–8.3)

## 2014-06-09 LAB — CBC WITH DIFFERENTIAL/PLATELET
Basophils Absolute: 0.1 10*3/uL (ref 0.0–0.1)
Basophils Relative: 0.7 % (ref 0.0–3.0)
Eosinophils Absolute: 0.3 10*3/uL (ref 0.0–0.7)
Eosinophils Relative: 2.8 % (ref 0.0–5.0)
HCT: 36.1 % (ref 36.0–46.0)
Hemoglobin: 11.7 g/dL — ABNORMAL LOW (ref 12.0–15.0)
Lymphocytes Relative: 44.4 % (ref 12.0–46.0)
Lymphs Abs: 5.1 10*3/uL — ABNORMAL HIGH (ref 0.7–4.0)
MCHC: 32.3 g/dL (ref 30.0–36.0)
MCV: 80.5 fl (ref 78.0–100.0)
Monocytes Absolute: 0.8 10*3/uL (ref 0.1–1.0)
Monocytes Relative: 7.3 % (ref 3.0–12.0)
Neutro Abs: 5.1 10*3/uL (ref 1.4–7.7)
Neutrophils Relative %: 44.8 % (ref 43.0–77.0)
Platelets: 498 10*3/uL — ABNORMAL HIGH (ref 150.0–400.0)
RBC: 4.48 Mil/uL (ref 3.87–5.11)
RDW: 15.9 % — ABNORMAL HIGH (ref 11.5–15.5)
WBC: 11.4 10*3/uL — ABNORMAL HIGH (ref 4.0–10.5)

## 2014-06-09 LAB — LIPID PANEL
Cholesterol: 220 mg/dL — ABNORMAL HIGH (ref 0–200)
HDL: 44.3 mg/dL (ref >39.00)
LDL Cholesterol: 137 mg/dL — ABNORMAL HIGH (ref 0–99)
NonHDL: 175.7
Total CHOL/HDL Ratio: 5
Triglycerides: 194 mg/dL — ABNORMAL HIGH (ref 0.0–149.0)
VLDL: 38.8 mg/dL (ref 0.0–40.0)

## 2014-06-09 LAB — VITAMIN D 25 HYDROXY (VIT D DEFICIENCY, FRACTURES): VITD: 15.92 ng/mL — ABNORMAL LOW (ref 30.00–100.00)

## 2014-06-09 LAB — TSH: TSH: 5.37 u[IU]/mL — ABNORMAL HIGH (ref 0.35–4.50)

## 2014-06-09 MED ORDER — CYANOCOBALAMIN 1000 MCG/ML IJ SOLN
1000.0000 ug | Freq: Once | INTRAMUSCULAR | Status: AC
  Administered 2014-06-09: 1000 ug via INTRAMUSCULAR

## 2014-06-09 NOTE — Progress Notes (Signed)
Pre visit review using our clinic review tool, if applicable. No additional management support is needed unless otherwise documented below in the visit note.  Patient tolerated injection well.  

## 2014-06-12 ENCOUNTER — Telehealth: Payer: Self-pay | Admitting: Internal Medicine

## 2014-06-12 NOTE — Telephone Encounter (Signed)
Please call the patient, blood work showed a number of abnormalities: Creatinine increased (renal function decreased), will recheck when she comes back, avoid taking Motrin or similar medications. Elevated WBCs, will recheck when she returns, most likely will need a referral to hematology since this is a chronic issue Mild anemia, please add or redraw iron and ferritin Hypothyroidism, needs better control, increased dose of  Synthroid to 200 mcg, send a new prescription #30, 2 RF Low vitamin D, needs to take ergocalciferol 50,000 units weekly #12 and no refills. Was seen with chest pain, she has an appointment to see cardiology 3-31, if symptoms are increasing or more frequent -- must go the ER or call.  Recommend to keep the appointment with me   April 19 , we need to follow-up all of the above issues

## 2014-06-13 ENCOUNTER — Other Ambulatory Visit (INDEPENDENT_AMBULATORY_CARE_PROVIDER_SITE_OTHER): Payer: Medicare PPO

## 2014-06-13 DIAGNOSIS — D649 Anemia, unspecified: Secondary | ICD-10-CM

## 2014-06-13 LAB — FERRITIN: Ferritin: 7.1 ng/mL — ABNORMAL LOW (ref 10.0–291.0)

## 2014-06-13 LAB — IRON: Iron: 35 ug/dL — ABNORMAL LOW (ref 42–145)

## 2014-06-13 NOTE — Telephone Encounter (Signed)
LMOM informing Pt to return call regarding lab results.  

## 2014-06-13 NOTE — Telephone Encounter (Signed)
Add on for Iron and Ferritin faxed to main lab.

## 2014-06-14 MED ORDER — LEVOTHYROXINE SODIUM 200 MCG PO TABS: 200.0000 ug | ORAL_TABLET | Freq: Every day | ORAL | Status: DC

## 2014-06-14 MED ORDER — VITAMIN D (ERGOCALCIFEROL) 1.25 MG (50000 UNIT) PO CAPS: 50000.0000 [IU] | ORAL_CAPSULE | ORAL | Status: DC

## 2014-06-14 NOTE — Addendum Note (Signed)
Addended by: Wilfrid Lund on: 06/14/2014 01:07 PM   Modules accepted: Orders, Medications

## 2014-06-14 NOTE — Telephone Encounter (Signed)
Letter printed and mailed to Pt, along with lab results informing of below. Informed her that medications were sent to [facility] on [address]. Informed her to call if she has any questions.

## 2014-06-23 ENCOUNTER — Ambulatory Visit: Payer: Medicare PPO

## 2014-06-23 ENCOUNTER — Telehealth: Payer: Self-pay

## 2014-06-23 NOTE — Telephone Encounter (Signed)
UDS: 06/09/2014  Negative for Alprazolam: PRN  Low risk per Dr. Larose Kells 06/23/2014

## 2014-07-07 ENCOUNTER — Encounter: Payer: Self-pay | Admitting: Internal Medicine

## 2014-07-07 ENCOUNTER — Ambulatory Visit (INDEPENDENT_AMBULATORY_CARE_PROVIDER_SITE_OTHER): Payer: Medicare PPO | Admitting: Internal Medicine

## 2014-07-07 VITALS — BP 122/80 | HR 76 | Ht 65.0 in | Wt 225.6 lb

## 2014-07-07 DIAGNOSIS — I25110 Atherosclerotic heart disease of native coronary artery with unstable angina pectoris: Secondary | ICD-10-CM | POA: Diagnosis not present

## 2014-07-07 DIAGNOSIS — E785 Hyperlipidemia, unspecified: Secondary | ICD-10-CM | POA: Diagnosis not present

## 2014-07-07 DIAGNOSIS — I10 Essential (primary) hypertension: Secondary | ICD-10-CM

## 2014-07-07 DIAGNOSIS — R072 Precordial pain: Secondary | ICD-10-CM

## 2014-07-07 DIAGNOSIS — Z7289 Other problems related to lifestyle: Secondary | ICD-10-CM

## 2014-07-07 DIAGNOSIS — R079 Chest pain, unspecified: Secondary | ICD-10-CM | POA: Diagnosis not present

## 2014-07-07 DIAGNOSIS — R0600 Dyspnea, unspecified: Secondary | ICD-10-CM

## 2014-07-07 DIAGNOSIS — R0609 Other forms of dyspnea: Secondary | ICD-10-CM

## 2014-07-07 DIAGNOSIS — Z789 Other specified health status: Secondary | ICD-10-CM
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
## 2014-07-07 NOTE — Patient Instructions (Signed)
Your physician has requested that you have an echocardiogram. Echocardiography is a painless test that uses sound waves to create images of your heart. It provides your doctor with information about the size and shape of your heart and how well your heart's chambers and valves are working. This procedure takes approximately one hour. There are no restrictions for this procedure.  Your physician has requested that you have a lexiscan myoview. For further information please visit [email or web address]. Please follow instruction sheet, as given.  Your physician recommends that you schedule a follow-up appointment after your tests.

## 2014-07-14 ENCOUNTER — Ambulatory Visit (HOSPITAL_COMMUNITY)
Admission: RE | Admit: 2014-07-14 | Discharge: 2014-07-14 | Disposition: A | Discharge status: Home / Self Care | Payer: Medicare PPO | Source: Ambulatory Visit | Attending: Cardiology | Admitting: Cardiology

## 2014-07-14 DIAGNOSIS — R0600 Dyspnea, unspecified: Secondary | ICD-10-CM | POA: Insufficient documentation

## 2014-07-14 DIAGNOSIS — R079 Chest pain, unspecified: Secondary | ICD-10-CM | POA: Diagnosis present

## 2014-07-14 NOTE — Progress Notes (Signed)
2D Echo Performed 07/14/2014    Marygrace Drought, RCS

## 2014-07-19 ENCOUNTER — Telehealth (HOSPITAL_COMMUNITY): Payer: Self-pay | Admitting: *Deleted

## 2014-07-19 ENCOUNTER — Telehealth (HOSPITAL_COMMUNITY): Payer: Self-pay

## 2014-07-19 NOTE — Telephone Encounter (Signed)
Encounter complete. 

## 2014-07-20 ENCOUNTER — Telehealth (HOSPITAL_COMMUNITY): Payer: Self-pay

## 2014-07-20 NOTE — Telephone Encounter (Signed)
Encounter complete. 

## 2014-07-21 ENCOUNTER — Ambulatory Visit (HOSPITAL_COMMUNITY)
Admission: RE | Admit: 2014-07-21 | Discharge: 2014-07-21 | Disposition: A | Discharge status: Home / Self Care | Payer: Medicare PPO | Source: Ambulatory Visit | Attending: Cardiovascular Disease | Admitting: Cardiovascular Disease

## 2014-07-21 ENCOUNTER — Telehealth (HOSPITAL_COMMUNITY): Payer: Self-pay

## 2014-07-21 DIAGNOSIS — R0609 Other forms of dyspnea: Secondary | ICD-10-CM | POA: Diagnosis not present

## 2014-07-21 DIAGNOSIS — J45909 Unspecified asthma, uncomplicated: Secondary | ICD-10-CM | POA: Insufficient documentation

## 2014-07-21 DIAGNOSIS — Z6837 Body mass index (BMI) 37.0-37.9, adult: Secondary | ICD-10-CM | POA: Insufficient documentation

## 2014-07-21 DIAGNOSIS — E669 Obesity, unspecified: Secondary | ICD-10-CM | POA: Diagnosis not present

## 2014-07-21 DIAGNOSIS — R002 Palpitations: Secondary | ICD-10-CM | POA: Insufficient documentation

## 2014-07-21 DIAGNOSIS — Z7289 Other problems related to lifestyle: Secondary | ICD-10-CM

## 2014-07-21 DIAGNOSIS — R079 Chest pain, unspecified: Secondary | ICD-10-CM | POA: Diagnosis present

## 2014-07-21 DIAGNOSIS — I2510 Atherosclerotic heart disease of native coronary artery without angina pectoris: Secondary | ICD-10-CM | POA: Diagnosis not present

## 2014-07-21 DIAGNOSIS — I10 Essential (primary) hypertension: Secondary | ICD-10-CM | POA: Diagnosis not present

## 2014-07-21 DIAGNOSIS — R0600 Dyspnea, unspecified: Secondary | ICD-10-CM | POA: Diagnosis not present

## 2014-07-21 DIAGNOSIS — R42 Dizziness and giddiness: Secondary | ICD-10-CM | POA: Diagnosis not present

## 2014-07-21 DIAGNOSIS — Z789 Other specified health status: Secondary | ICD-10-CM

## 2014-07-21 DIAGNOSIS — Z8249 Family history of ischemic heart disease and other diseases of the circulatory system: Secondary | ICD-10-CM | POA: Diagnosis not present

## 2014-07-21 DIAGNOSIS — R5383 Other fatigue: Secondary | ICD-10-CM | POA: Diagnosis not present

## 2014-07-21 MED ORDER — REGADENOSON 0.4 MG/5ML IV SOLN
0.4000 mg | Freq: Once | INTRAVENOUS | Status: AC
  Administered 2014-07-21: 0.4 mg via INTRAVENOUS

## 2014-07-21 MED ORDER — TECHNETIUM TC 99M SESTAMIBI GENERIC - CARDIOLITE
10.5000 | Freq: Once | INTRAVENOUS | Status: AC | PRN
  Administered 2014-07-21: 11 via INTRAVENOUS

## 2014-07-21 MED ORDER — TECHNETIUM TC 99M SESTAMIBI GENERIC - CARDIOLITE
32.7000 | Freq: Once | INTRAVENOUS | Status: AC | PRN
  Administered 2014-07-21: 32.7 via INTRAVENOUS

## 2014-07-21 NOTE — Procedures (Addendum)
[facility] [address] [place] [address] [phone]  Cardiology Nuclear Med Study  Kayla [name] is a 69 y.o. female     MRN : [id number]     DOB: 1945/09/20  Procedure Date: 07/21/2014  Nuclear Med Background Indication for Stress Test:  Evaluation for Ischemia History:  Asthma and CAD;CATH-2009-MILD NON-OBSTRUCTIVE;SYSTOLIC MURMUR;ECHO ON 46/96/2952-WU=13-24% Cardiac Risk Factors: Family History - CAD, Hypertension, Lipids and Obesity  Symptoms:  Chest Pain, Dizziness, DOE, Fatigue, Light-Headedness, Palpitations and SOB   Nuclear Pre-Procedure Caffeine/Decaff Intake:  1:00am NPO After: 9:00am   IV Site: R Forearm  IV 0.9% NS with Angio Cath:  22g  Chest Size (in):  n/a IV Started by: Rolene Course, RN  Height: 5\' 5"  (1.651 m)  Cup Size: D  BMI:  Body mass index is 37.61 kg/(m^2). Weight:  226 lb (102.513 kg)   Tech Comments:  n/a    Nuclear Med Study 1 or 2 day study: 1 day  Stress Test Type:  [place] Provider:  Lyman Bishop, MD   Resting Radionuclide: Technetium 13m Sestamibi  Resting Radionuclide Dose: 10.5 mCi   Stress Radionuclide:  Technetium 10m Sestamibi  Stress Radionuclide Dose: 32.7 mCi           Stress Protocol Rest HR: 70 Stress HR: 80  Rest BP: 146/93 Stress BP: 149/80  Exercise Time (min): n/a METS: n/a          Dose of Adenosine (mg):  n/a Dose of Lexiscan: 0.4 mg  Dose of Atropine (mg): n/a Dose of Dobutamine: n/a mcg/kg/min (at max HR)  Stress Test Technologist: Leane Para, CCT Nuclear Technologist:Elizabeth Young,CNMT   Rest Procedure:  Myocardial perfusion imaging was performed at rest 45 minutes following the intravenous administration of Technetium 84m Sestamibi. Stress Procedure:  The patient received IV Lexiscan 0.4 mg over 15-seconds.  Technetium 54m Sestamibi injected IV at 30-seconds.  There were no significant changes with Lexiscan.  Quantitative  spect images were obtained after a 45 minute delay.  Transient Ischemic Dilatation (Normal <1.22):  0.87 QGS EDV:  75 ml QGS ESV:  21 ml LV Ejection Fraction: 72%       Rest ECG: NSR - Normal EKG  Stress ECG: No significant change from baseline ECG  QPS Raw Data Images:  Normal; no motion artifact; normal heart/lung ratio. Stress Images:  Normal homogeneous uptake in all areas of the myocardium. Rest Images:  Normal homogeneous uptake in all areas of the myocardium. Subtraction (SDS):  Normal  Impression Exercise Capacity:  Lexiscan with no exercise. BP Response:  Normal blood pressure response. Clinical Symptoms:  Mild shortness of breath ECG Impression:  No significant ST segment change suggestive of ischemia. Comparison with Prior Nuclear Study: No images to compare  Overall Impression:  Normal stress nuclear study.  LV Wall Motion:  NL LV Function, EF 72%; NL Wall Motion   KELLY,THOMAS A, MD  07/21/2014 4:43 PM

## 2014-07-21 NOTE — Telephone Encounter (Signed)
Encounter complete. 

## 2014-07-26 ENCOUNTER — Ambulatory Visit: Payer: Medicare PPO | Admitting: Internal Medicine

## 2014-08-02 ENCOUNTER — Encounter: Payer: Self-pay | Admitting: Internal Medicine

## 2014-08-02 ENCOUNTER — Ambulatory Visit (INDEPENDENT_AMBULATORY_CARE_PROVIDER_SITE_OTHER): Payer: Medicare PPO | Admitting: Internal Medicine

## 2014-08-02 VITALS — BP 149/82 | HR 97 | Ht 65.0 in | Wt 225.0 lb

## 2014-08-02 DIAGNOSIS — R0602 Shortness of breath: Secondary | ICD-10-CM

## 2014-08-02 DIAGNOSIS — I10 Essential (primary) hypertension: Secondary | ICD-10-CM

## 2014-08-02 DIAGNOSIS — M797 Fibromyalgia: Secondary | ICD-10-CM | POA: Diagnosis not present

## 2014-08-02 DIAGNOSIS — I2510 Atherosclerotic heart disease of native coronary artery without angina pectoris: Secondary | ICD-10-CM | POA: Diagnosis not present

## 2014-08-02 NOTE — Patient Instructions (Signed)
Your physician wants you to follow-up in: 6 months with Dr. Hilty. You will receive a reminder letter in the mail two months in advance. If you don't receive a letter, please call our office to schedule the follow-up appointment.    

## 2014-08-02 NOTE — Progress Notes (Signed)
OFFICE NOTE  Chief Complaint:  Follow-up tests  Primary Care Physician: Kathlene November, MD  HPI:  Kayla [name] is a pleasant 69 year old female was kindly referred to me by Dr. Larose Kells for evaluation of possible unstable angina. She apparently has a history of mild, nonobstructive coronary artery disease by catheterization in 2009 by Dr. Burt Knack. I could not find records of that in our system but I will search to see if I can find a catheterization films. I asked her about that catheterization today and she cannot recall it. She does remember being taken to the hospital for chest pain by a friend of hers and she was in the hospital for several days. There is an old echocardiogram around that time which shows normal systolic function. She reports over the past several months she's had progressive shortness of breath as well as left-sided chest pressure which radiated some to her left arm. She does have a history of significant trauma secondary to an accident which caused her to have metal plates, screws and rods placed in her back and neck. She says she has chronic pain from this and in fact has fibromyalgia and is difficult for her to sort out the cause of her pain. She does have a history of some hypertension and is on diltiazem and low-dose aspirin. She's experience worsening shortness of breath recently and reports she's "exhausted" just walking across the room. She is significantly obese with a BMI 37. She's also been under significant stress recently. She reports her husband died recently and her mother was diagnosed with cancer. In addition she says she is close to losing her house due to a foreclosure on her mortgage.  I the pleasure seeing Kayla [name] back in the office today. She reports she still having some chest discomfort. She gets some pain in her chest when reaching up high and bending her neck back which may be related to her prior neck trauma and significant orthopedic reconstruction. In addition  she has fibromyalgia and I think she is likely having flares of her fibromyalgia. She is under significant stress and she was told today that she in fact is going to lose her house according to the bank. She's not currently being treated for her fibromyalgia and I think that may be a good idea. We reviewed her stress test and echocardiogram which showed preserved function and no evidence for ischemia.  PMHx:  Past Medical History  Diagnosis Date  . Asthma   . GERD (gastroesophageal reflux disease)   . Hypertension   . Hypothyroid   . Chronic LBP   . B12 deficiency   . IBS (irritable bowel syndrome)   . CAD (coronary artery disease) 10/2007    abnormal stress test and nonobstructive CAD per cardiac cath performed on October 22, 2007, per Dr. Legrand Como Copper  . Hyperlipemia     Past Surgical History  Procedure Laterality Date  . Neck surgery  1998  . Cholecystectomy      FAMHx:  Family History  Problem Relation Age of Onset  . Heart attack Father 7  . Lung cancer      uncle, non smoker  . Leukemia      uncle  . Colon cancer Neg Hx   . Breast cancer Neg Hx   . Diabetes Neg Hx     SOCHx:   reports that she has never smoked. She has never used smokeless tobacco. She reports that she does not drink alcohol. Her drug history  is not on file.  ALLERGIES:  Allergies  Allergen Reactions  . Ace Inhibitors Anaphylaxis    REACTION: glossal edema    ROS: A comprehensive review of systems was negative except for: Respiratory: positive for dyspnea on exertion Cardiovascular: positive for chest pain  HOME MEDS: Current Outpatient Prescriptions  Medication Sig Dispense Refill  . aspirin 81 MG tablet Take 81 mg by mouth daily.    . Cyanocobalamin (VITAMIN B 12 PO) Take 1 tablet by mouth daily.    Marland Kitchen diltiazem (MATZIM LA) 360 MG 24 hr tablet Take 1 tablet (360 mg total) by mouth daily. 90 tablet 3  . levothyroxine (SYNTHROID, LEVOTHROID) 200 MCG tablet Take 1 tablet (200 mcg total) by  mouth daily before breakfast. 30 tablet 2  . nitroGLYCERIN (NITROSTAT) 0.4 MG SL tablet Place 1 tablet (0.4 mg total) under the tongue every 5 (five) minutes as needed for chest pain. 20 tablet 3  . Vitamin D, Ergocalciferol, (DRISDOL) 50000 UNITS CAPS capsule Take 1 capsule (50,000 Units total) by mouth every 7 (seven) days. 12 capsule 0   No current facility-administered medications for this visit.    LABS/IMAGING: No results found for this or any previous visit (from the past 48 hour(s)). No results found.  WEIGHTS: Wt Readings from Last 3 Encounters:  08/02/14 225 lb (102.059 kg)  07/21/14 226 lb (102.513 kg)  07/07/14 225 lb 9.6 oz (102.331 kg)    VITALS: BP 149/82 mmHg  Pulse 97  Ht 5\' 5"  (1.651 m)  Wt 225 lb (102.059 kg)  BMI 37.44 kg/m2  EXAM: Deferred  EKG: Deferred  ASSESSMENT: 1. Chest pain - negative nuclear stress test - echo without significant findings, normal EF 2. Dyspnea on exertion 3. Mild nonobstructive disease by report of cardiac catheterization in 2009 4. Hypertension-controlled 5. Moderate obesity 6. Significant life stressors 7. Fibromyalgia  PLAN: 1.   Kayla [name] continues to be having some chest discomfort which I think is related to fibromyalgia or may be related to her neck. She has pain sometimes when she looks up and reaches into the air. This could be related to her prior neck surgery. I suspect a fibroyalgias flaring under the setting of significant stress. She's had recent information from her bank suggesting that she may lose her house. I would recommend that she undergo treatment for fibromyalgia pain and may benefit from a medication like Cymbalta which has neuropathic benefits as well as treating depression/anxiety. She also reports she's had some benefit from Flexeril in the past. I will defer this to her primary care provider.  No further cardiac workup is indicated at this time. Plan to see her back in 6 months.  Pixie Casino,  MD, [facility] Attending Cardiologist [facility]  HILTY,Kenneth C 08/02/2014, 4:37 PM

## 2014-09-14 ENCOUNTER — Ambulatory Visit: Payer: Medicare PPO | Admitting: Internal Medicine

## 2014-10-29 ENCOUNTER — Other Ambulatory Visit: Payer: Self-pay | Admitting: Internal Medicine

## 2014-11-16 ENCOUNTER — Encounter: Payer: Self-pay | Admitting: Gastroenterology

## 2014-12-05 ENCOUNTER — Other Ambulatory Visit: Payer: Self-pay | Admitting: Internal Medicine

## 2014-12-19 ENCOUNTER — Other Ambulatory Visit: Payer: Self-pay | Admitting: Internal Medicine

## 2014-12-20 ENCOUNTER — Telehealth: Payer: Self-pay | Admitting: Internal Medicine

## 2014-12-20 MED ORDER — LEVOTHYROXINE SODIUM 200 MCG PO TABS: 200.0000 ug | ORAL_TABLET | Freq: Every day | ORAL | Status: DC

## 2014-12-20 NOTE — Telephone Encounter (Signed)
Pt called and scheduled appt for 01/03/15. [facility] [address].  Pt call dropped when I was verifying pharmacy.

## 2014-12-20 NOTE — Telephone Encounter (Signed)
Pt has missed 2 appts since last OV in 03/2014.

## 2014-12-20 NOTE — Telephone Encounter (Signed)
[facility] until appt.

## 2015-01-03 ENCOUNTER — Telehealth: Payer: Self-pay | Admitting: Internal Medicine

## 2015-01-03 ENCOUNTER — Ambulatory Visit: Payer: Medicare PPO | Admitting: Internal Medicine

## 2015-01-05 ENCOUNTER — Telehealth: Payer: Self-pay | Admitting: Internal Medicine

## 2015-01-05 NOTE — Telephone Encounter (Signed)
Relation to pt: self Call back [place]:  [id number] [place], [place] [phone] (Phone) [phone] (Fax)         Reason for call:  Patient lost her mother recently and these last past few weeks have been extremely hard. Patient scheduled appointment for 01/16/15 at 4pm. requesting levothyroxine (SYNTHROID, LEVOTHROID) 200 MCG tablet to hold her over until appointment patient is completely out.

## 2015-01-05 NOTE — Telephone Encounter (Signed)
Ok , will see her 01-16-15

## 2015-01-05 NOTE — Telephone Encounter (Signed)
Yes, charge Also, please let me know if she has more than 3 no-shows

## 2015-01-05 NOTE — Telephone Encounter (Signed)
Pt has several cancellations, typically the evening before her appt, but I only see no show for lab and nurse visits.

## 2015-01-05 NOTE — Telephone Encounter (Signed)
Pt has missed appts scheduled on 3/17, 4/19, 6/8, and 9/27, each time I have given Levothyroxine to hold Pt over until appt, she calls and cancels or does not show. Her last TSH was 5.37 on 06/09/2014. Please advise.

## 2015-01-05 NOTE — Telephone Encounter (Signed)
Pt was no show 01/03/15 2:30pm, follow up appt, pt called in at 12:08pm to cancel appt, she has virus/ dont feel good to drive to appt, charge for no show?

## 2015-01-05 NOTE — Telephone Encounter (Signed)
Noted  

## 2015-01-16 ENCOUNTER — Encounter: Payer: Self-pay | Admitting: Internal Medicine

## 2015-01-16 ENCOUNTER — Ambulatory Visit (INDEPENDENT_AMBULATORY_CARE_PROVIDER_SITE_OTHER): Payer: Medicare PPO | Admitting: Internal Medicine

## 2015-01-16 VITALS — BP 130/78 | HR 78 | Temp 98.0°F | Ht 65.0 in | Wt 225.4 lb

## 2015-01-16 DIAGNOSIS — E538 Deficiency of other specified B group vitamins: Secondary | ICD-10-CM | POA: Diagnosis not present

## 2015-01-16 DIAGNOSIS — E039 Hypothyroidism, unspecified: Secondary | ICD-10-CM

## 2015-01-16 DIAGNOSIS — D649 Anemia, unspecified: Secondary | ICD-10-CM | POA: Diagnosis not present

## 2015-01-16 DIAGNOSIS — J45909 Unspecified asthma, uncomplicated: Secondary | ICD-10-CM

## 2015-01-16 DIAGNOSIS — I10 Essential (primary) hypertension: Secondary | ICD-10-CM

## 2015-01-16 DIAGNOSIS — Z09 Encounter for follow-up examination after completed treatment for conditions other than malignant neoplasm: Secondary | ICD-10-CM

## 2015-01-16 MED ORDER — ZOLPIDEM TARTRATE 5 MG PO TABS: 5.0000 mg | ORAL_TABLET | Freq: Every evening | ORAL | Status: DC | PRN

## 2015-01-16 NOTE — Progress Notes (Signed)
Pre visit review using our clinic review tool, if applicable. No additional management support is needed unless otherwise documented below in the visit note. 

## 2015-01-16 NOTE — Patient Instructions (Signed)
Get your blood work before you leave      Next visit  for a   physical exam in 4 months, fasting   Please schedule an appointment at the front desk

## 2015-01-16 NOTE — Progress Notes (Signed)
Subjective:    Patient ID: Kayla [name], female    DOB: 1945-05-17, 69 y.o.   MRN: [id number]  DOS:  01/16/2015 Type of visit - description : Follow-up Interval history: Anxiety, under a lot of stress, see nurse comments. Asthma: not  wheezing, continue with DOE. CAD: Was referred to cardiology several months ago, workup reviewed. Hypothyroidism: TSH, reports good compliance with medication Insomnia: Having a hard time sleeping, Ambien usually helps. Request a prescription   Review of Systems   At this point, she rarely has chest pain and no difficulty breathing a rest. History of IBS, occasional diarrhea and abdominal cramps but denies vomiting, blood in the stools. Denies depression per se, she is anxious. No suicidal thoughts.  Past Medical History  Diagnosis Date  . Asthma   . GERD (gastroesophageal reflux disease)   . Hypertension   . Hypothyroid   . Chronic LBP   . B12 deficiency   . IBS (irritable bowel syndrome)   . CAD (coronary artery disease) 10/2007    abnormal stress test and nonobstructive CAD per cardiac cath performed on October 22, 2007, per Dr. Legrand Como Copper  . Hyperlipemia     Past Surgical History  Procedure Laterality Date  . Neck surgery  1998  . Cholecystectomy      Social History   Social History  . Marital Status: Widowed    Spouse Name: N/A  . Number of Children: 2  . Years of Education: N/A   Occupational History  . Not on file.   Social History Main Topics  . Smoking status: Never Smoker   . Smokeless tobacco: Never Used  . Alcohol Use: No  . Drug Use: Not on file  . Sexual Activity: Not on file   Other Topics Concern  . Not on file   Social History Narrative   Lost husband 2009, lost mother 2016        Medication List       This list is accurate as of: 01/16/15 11:59 PM.  Always use your most recent med list.               aspirin 81 MG tablet  Take 81 mg by mouth daily.     diltiazem 360 MG 24 hr tablet    Commonly known as:  MATZIM LA  Take 1 tablet (360 mg total) by mouth daily.     levothyroxine 200 MCG tablet  Commonly known as:  SYNTHROID, LEVOTHROID  Take 1 tablet (200 mcg total) by mouth daily before breakfast.     nitroGLYCERIN 0.4 MG SL tablet  Commonly known as:  NITROSTAT  Place 1 tablet (0.4 mg total) under the tongue every 5 (five) minutes as needed for chest pain.     VITAMIN B 12 PO  Take 1 tablet by mouth daily.     zolpidem 5 MG tablet  Commonly known as:  AMBIEN  Take 1 tablet (5 mg total) by mouth at bedtime as needed for sleep.           Objective:   Physical Exam BP 130/78 mmHg  Pulse 78  Temp(Src) 98 F (36.7 C) (Oral)  Ht 5\' 5"  (1.651 m)  Wt 225 lb 6 oz (102.229 kg)  BMI 37.50 kg/m2  SpO2 98% General:   Well developed, well nourished .  HEENT:  Normocephalic . Face symmetric, atraumatic Lungs:  CTA B Normal respiratory effort, no intercostal retractions, no accessory muscle use. Heart: RRR,  no murmur.  No pretibial edema bilaterally  Skin: Not pale. Not jaundice Neurologic:  alert & oriented X3.  Speech normal, gait appropriate for age and unassisted Psych--  Cognition and judgment appear intact.  Cooperative with normal attention span and concentration.  Behavior appropriate. Emotional and tearful when you talk about her mother.     Assessment & Plan:    assessment  > HTN Hypothyroidism Hyperlipidemia Anxiety intol to paxil years ago insmonia  CAD:  --Abnormal stress test f/u by a  cath 10/22/2007 nonobstructive CAD --CP, DOE2-2016, saw cards, ECHO diastolic dysfx, stress test essentially neg  Asthma B12 and Vit D deficiency GI: GERD, IBS Chronic LBP Fibromyalgia per pt, Dx by Dr Cherlynn Kaiser years ago  Plan: Hypothyroidism: Last TSH was elevated, at this time she reports good compliance of medication. Check a TSH , RF meds  Anxiety: She has a number of challenges in her life, she is counseled. She declined medications such as  SSRIs or see a counselor in our office. Recommend to reach for help if needed Insomnia: Request a prescription for Ambien which helped in the past. Prescription printed DOE Ongoing DOE, cardio workup few months ago showed diastolic dysfunction which could explain some of her symptoms. She also has asthma and although she is not wheezing, I offered a trial with inhalers: Declined. Requests PFTs which were order. Asthma: See above Anemia: Few months ago, she was noted to have anemia with iron deficiency. I will insist on a GI referral. She agrees Primary care: Declined pneumonia, flu and tetanus shots RTC 4 months

## 2015-01-17 ENCOUNTER — Encounter: Payer: Self-pay | Admitting: Gastroenterology

## 2015-01-17 ENCOUNTER — Other Ambulatory Visit: Payer: Self-pay | Admitting: Internal Medicine

## 2015-01-17 DIAGNOSIS — D649 Anemia, unspecified: Secondary | ICD-10-CM

## 2015-01-17 LAB — FERRITIN: Ferritin: 5.3 ng/mL — ABNORMAL LOW (ref 10.0–291.0)

## 2015-01-17 LAB — CBC WITH DIFFERENTIAL/PLATELET
Basophils Absolute: 0.1 10*3/uL (ref 0.0–0.1)
Basophils Relative: 0.5 % (ref 0.0–3.0)
Eosinophils Absolute: 0.2 10*3/uL (ref 0.0–0.7)
Eosinophils Relative: 1.7 % (ref 0.0–5.0)
HCT: 36 % (ref 36.0–46.0)
Hemoglobin: 11.4 g/dL — ABNORMAL LOW (ref 12.0–15.0)
Lymphocytes Relative: 27.7 % (ref 12.0–46.0)
Lymphs Abs: 3.4 10*3/uL (ref 0.7–4.0)
MCHC: 31.7 g/dL (ref 30.0–36.0)
MCV: 79.4 fl (ref 78.0–100.0)
Monocytes Absolute: 0.7 10*3/uL (ref 0.1–1.0)
Monocytes Relative: 6 % (ref 3.0–12.0)
Neutro Abs: 7.8 10*3/uL — ABNORMAL HIGH (ref 1.4–7.7)
Neutrophils Relative %: 64.1 % (ref 43.0–77.0)
Platelets: 477 10*3/uL — ABNORMAL HIGH (ref 150.0–400.0)
RBC: 4.54 Mil/uL (ref 3.87–5.11)
RDW: 16.5 % — ABNORMAL HIGH (ref 11.5–15.5)
WBC: 12.2 10*3/uL — ABNORMAL HIGH (ref 4.0–10.5)

## 2015-01-17 LAB — BASIC METABOLIC PANEL
BUN: 17 mg/dL (ref 6–23)
CO2: 23 mEq/L (ref 19–32)
Calcium: 9.4 mg/dL (ref 8.4–10.5)
Chloride: 113 mEq/L — ABNORMAL HIGH (ref 96–112)
Creatinine, Ser: 1.07 mg/dL (ref 0.40–1.20)
GFR: 53.94 mL/min — ABNORMAL LOW (ref >60.00)
Glucose, Bld: 111 mg/dL — ABNORMAL HIGH (ref 70–99)
Potassium: 4.4 mEq/L (ref 3.5–5.1)
Sodium: 145 mEq/L (ref 135–145)

## 2015-01-17 LAB — IRON: Iron: 27 ug/dL — ABNORMAL LOW (ref 42–145)

## 2015-01-17 LAB — TSH: TSH: 3.65 u[IU]/mL (ref 0.35–4.50)

## 2015-01-17 LAB — VITAMIN B12: Vitamin B-12: 298 pg/mL (ref 211–911)

## 2015-01-22 DIAGNOSIS — Z09 Encounter for follow-up examination after completed treatment for conditions other than malignant neoplasm: Secondary | ICD-10-CM | POA: Insufficient documentation

## 2015-01-22 NOTE — Assessment & Plan Note (Signed)
Hypothyroidism: Last TSH was elevated, at this time she reports good compliance of medication. Check a TSH , RF meds  Anxiety: She has a number of challenges in her life, she is counseled. She declined medications such as SSRIs or see a counselor in our office. Recommend to reach for help if needed Insomnia: Request a prescription for Ambien which helped in the past. Prescription printed DOE Ongoing DOE, cardio workup few months ago showed diastolic dysfunction which could explain some of her symptoms. She also has asthma and although she is not wheezing, I offered a trial with inhalers: Declined. Requests PFTs which were order. Asthma: See above Anemia: Few months ago, she was noted to have anemia with iron deficiency. I will insist on a GI referral. She agrees Primary care: Declined pneumonia, flu and tetanus shots RTC 4 months

## 2015-01-23 MED ORDER — FERROUS SULFATE 325 (65 FE) MG PO TABS: 325.0000 mg | ORAL_TABLET | Freq: Two times a day (BID) | ORAL | Status: DC

## 2015-01-23 NOTE — Addendum Note (Signed)
Addended by: Wilfrid Lund on: 01/23/2015 10:23 AM   Modules accepted: Orders

## 2015-02-09 ENCOUNTER — Other Ambulatory Visit: Payer: Self-pay | Admitting: Internal Medicine

## 2015-03-15 ENCOUNTER — Ambulatory Visit: Payer: Medicare PPO | Admitting: Gastroenterology

## 2015-05-03 ENCOUNTER — Encounter (HOSPITAL_BASED_OUTPATIENT_CLINIC_OR_DEPARTMENT_OTHER): Payer: Self-pay | Admitting: Emergency Medicine

## 2015-05-03 ENCOUNTER — Emergency Department (HOSPITAL_BASED_OUTPATIENT_CLINIC_OR_DEPARTMENT_OTHER)
Admission: EM | Admit: 2015-05-03 | Discharge: 2015-05-03 | Disposition: A | Discharge status: Home / Self Care | Payer: Medicare Other | Attending: Emergency Medicine | Admitting: Emergency Medicine

## 2015-05-03 DIAGNOSIS — Z7982 Long term (current) use of aspirin: Secondary | ICD-10-CM | POA: Diagnosis not present

## 2015-05-03 DIAGNOSIS — G8929 Other chronic pain: Secondary | ICD-10-CM | POA: Diagnosis not present

## 2015-05-03 DIAGNOSIS — Z79899 Other long term (current) drug therapy: Secondary | ICD-10-CM | POA: Diagnosis not present

## 2015-05-03 DIAGNOSIS — Z8739 Personal history of other diseases of the musculoskeletal system and connective tissue: Secondary | ICD-10-CM | POA: Diagnosis not present

## 2015-05-03 DIAGNOSIS — Z8719 Personal history of other diseases of the digestive system: Secondary | ICD-10-CM | POA: Insufficient documentation

## 2015-05-03 DIAGNOSIS — E039 Hypothyroidism, unspecified: Secondary | ICD-10-CM | POA: Diagnosis not present

## 2015-05-03 DIAGNOSIS — N390 Urinary tract infection, site not specified: Secondary | ICD-10-CM

## 2015-05-03 DIAGNOSIS — I10 Essential (primary) hypertension: Secondary | ICD-10-CM | POA: Insufficient documentation

## 2015-05-03 DIAGNOSIS — J45909 Unspecified asthma, uncomplicated: Secondary | ICD-10-CM | POA: Diagnosis not present

## 2015-05-03 DIAGNOSIS — E538 Deficiency of other specified B group vitamins: Secondary | ICD-10-CM | POA: Diagnosis not present

## 2015-05-03 DIAGNOSIS — R1030 Lower abdominal pain, unspecified: Secondary | ICD-10-CM | POA: Diagnosis present

## 2015-05-03 HISTORY — DX: Fibromyalgia: M79.7

## 2015-05-03 LAB — URINE MICROSCOPIC-ADD ON

## 2015-05-03 LAB — URINALYSIS, ROUTINE W REFLEX MICROSCOPIC
Bilirubin Urine: NEGATIVE
Glucose, UA: NEGATIVE mg/dL
Hgb urine dipstick: NEGATIVE
Ketones, ur: 15 mg/dL — AB
Nitrite: NEGATIVE
Protein, ur: 30 mg/dL — AB
Specific Gravity, Urine: 1.043 — ABNORMAL HIGH (ref 1.005–1.030)
pH: 5.5 (ref 5.0–8.0)

## 2015-05-03 MED ORDER — FLUCONAZOLE 150 MG PO TABS: 150.0000 mg | ORAL_TABLET | Freq: Once | ORAL | Status: DC

## 2015-05-03 MED ORDER — PHENAZOPYRIDINE HCL 200 MG PO TABS: 200.0000 mg | ORAL_TABLET | Freq: Three times a day (TID) | ORAL | Status: DC

## 2015-05-03 MED ORDER — CEFPODOXIME PROXETIL 100 MG PO TABS: 100.0000 mg | ORAL_TABLET | Freq: Two times a day (BID) | ORAL | Status: DC

## 2015-05-03 NOTE — ED Notes (Addendum)
Walked patient to room 10, Patient request to sit in chair for comfort.

## 2015-05-03 NOTE — Discharge Instructions (Signed)

## 2015-05-03 NOTE — ED Notes (Signed)
Urinary sx for a couple weeks but has gotten worse since yesterday.  Suprapubic pain.  Hx of UTI and Nephritis.

## 2015-05-03 NOTE — ED Provider Notes (Signed)
CSN: [id number]     Arrival date & time 05/03/15  1749 History  By signing my name below, I, Kayla [name], attest that this documentation has been prepared under the direction and in the presence of Deno Etienne, DO. Electronically Signed: Doran [name], ED Scribe. 05/03/2015. 7:54 PM.   Chief Complaint  Patient presents with  . Abdominal Pain   The history is provided by the patient. No language interpreter was used.   HPI Comments: Kayla [name] is a 70 y.o. female with a h/o IBS, GERD, UTI and Nephritis who presents to the Emergency Department complaining of lower abdominal pain for one month that worsened yesterday. Pt reports she has pain before urination. However, he pain is relieved after urination. Pt had these symptoms before associated with a bladder infection. Pt also reports chronic ongoing back pain. Pt denies fevers, chills, CP, nausea, vomiting, or any other associated sx at this time.   Past Medical History  Diagnosis Date  . Asthma   . GERD (gastroesophageal reflux disease)   . Hypertension   . Hypothyroid   . Chronic LBP   . B12 deficiency   . IBS (irritable bowel syndrome)   . CAD (coronary artery disease) 10/2007    abnormal stress test and nonobstructive CAD per cardiac cath performed on October 22, 2007, per Dr. Legrand Como Copper  . Hyperlipemia   . Fibromyalgia    Past Surgical History  Procedure Laterality Date  . Neck surgery  1998  . Cholecystectomy    . Cesarean section     Family History  Problem Relation Age of Onset  . Heart attack Father 38  . Lung cancer      uncle, non smoker  . Leukemia      uncle  . Colon cancer Neg Hx   . Breast cancer Neg Hx   . Diabetes Neg Hx    Social History  Substance Use Topics  . Smoking status: Never Smoker   . Smokeless tobacco: Never Used  . Alcohol Use: No   OB History    No data available     Review of Systems  Constitutional: Negative for fever and chills.  HENT: Negative for congestion and rhinorrhea.    Eyes: Negative for redness and visual disturbance.  Respiratory: Negative for shortness of breath and wheezing.   Cardiovascular: Negative for chest pain and palpitations.  Gastrointestinal: Positive for abdominal pain. Negative for nausea and vomiting.  Genitourinary: Negative for dysuria and urgency.  Musculoskeletal: Positive for back pain. Negative for myalgias and arthralgias.  Skin: Negative for pallor and wound.  Neurological: Negative for dizziness and headaches.    Allergies  Ace inhibitors  Home Medications   Prior to Admission medications   Medication Sig Start Date End Date Taking? Authorizing Provider  aspirin 81 MG tablet Take 81 mg by mouth daily.    Historical Provider, MD  cefpodoxime (VANTIN) 100 MG tablet Take 1 tablet (100 mg total) by mouth 2 (two) times daily. 05/03/15   Deno Etienne, DO  Cyanocobalamin (VITAMIN B 12 PO) Take 1 tablet by mouth daily.    Historical Provider, MD  diltiazem (MATZIM LA) 360 MG 24 hr tablet Take 1 tablet (360 mg total) by mouth daily. 05/18/14   Colon Branch, MD  ferrous sulfate 325 (65 FE) MG tablet Take 1 tablet (325 mg total) by mouth 2 (two) times daily with a meal. 01/23/15   Colon Branch, MD  fluconazole (DIFLUCAN) 150 MG tablet Take 1 tablet (  150 mg total) by mouth once. 05/03/15   Deno Etienne, DO  levothyroxine (SYNTHROID, LEVOTHROID) 200 MCG tablet Take 1 tablet (200 mcg total) by mouth daily before breakfast. 12/20/14   Colon Branch, MD  levothyroxine (SYNTHROID, LEVOTHROID) 200 MCG tablet Take 1 tablet (200 mcg total) by mouth daily before breakfast. 02/09/15   Colon Branch, MD  nitroGLYCERIN (NITROSTAT) 0.4 MG SL tablet Place 1 tablet (0.4 mg total) under the tongue every 5 (five) minutes as needed for chest pain. Patient not taking: Reported on 01/16/2015 05/18/14   Colon Branch, MD  phenazopyridine (PYRIDIUM) 200 MG tablet Take 1 tablet (200 mg total) by mouth 3 (three) times daily. 05/03/15   Deno Etienne, DO  zolpidem (AMBIEN) 5 MG tablet Take 1  tablet (5 mg total) by mouth at bedtime as needed for sleep. 01/16/15   Colon Branch, MD   BP 138/94 mmHg  Pulse 95  Temp(Src) 98.5 F (36.9 C) (Oral)  Resp 16  Ht 5\' 5"  (1.651 m)  Wt 180 lb (81.647 kg)  BMI 29.95 kg/m2  SpO2 97% Physical Exam  Constitutional: She is oriented to person, place, and time. She appears well-developed and well-nourished. No distress.  HENT:  Head: Normocephalic and atraumatic.  Eyes: EOM are normal. Pupils are equal, round, and reactive to light.  Neck: Normal range of motion. Neck supple.  Cardiovascular: Normal rate and regular rhythm.  Exam reveals no gallop and no friction rub.   No murmur heard. Pulmonary/Chest: Effort normal. She has no wheezes. She has no rales.  Abdominal: Soft. She exhibits no distension. There is no tenderness.  Musculoskeletal: She exhibits no edema or tenderness.  Neurological: She is alert and oriented to person, place, and time.  Skin: Skin is warm and dry. She is not diaphoretic.  Psychiatric: She has a normal mood and affect. Her behavior is normal.  Nursing note and vitals reviewed.   ED Course  Procedures  DIAGNOSTIC STUDIES: Oxygen Saturation is 97% on room air, normal by my interpretation.    COORDINATION OF CARE: 7:41 PM Will order urinalysis. Discussed treatment plan with pt at bedside and pt agreed to plan.  Labs Review Labs Reviewed  URINALYSIS, ROUTINE W REFLEX MICROSCOPIC (NOT AT [facility]) - Abnormal; Notable for the following:    Color, Urine AMBER (*)    APPearance CLOUDY (*)    Specific Gravity, Urine 1.043 (*)    Ketones, ur 15 (*)    Protein, ur 30 (*)    Leukocytes, UA MODERATE (*)    All other components within normal limits  URINE MICROSCOPIC-ADD ON - Abnormal; Notable for the following:    Squamous Epithelial / LPF 6-30 (*)    Bacteria, UA FEW (*)    All other components within normal limits    Imaging Review No results found.    EKG Interpretation None      MDM   Final diagnoses:   UTI (lower urinary tract infection)    70 yo F with a chief complaint of UTI. Patient states she's had multiple episodes of vasculitis or urinary tract infection. Patient had a UA with moderate leuks 6-30 whites and bacteria. Will treat with Vantin as patient has had multiple episodes of UTIs in the past. Will have her follow-up with her family physician. Doubt pyelonephritis as patient is well-appearing and has no back pain. No fevers.  I personally performed the services described in this documentation, which was scribed in my presence. The recorded information has  been reviewed and is accurate.     Deno Etienne, DO 05/03/15 2016

## 2015-05-03 NOTE — ED Notes (Signed)
MD at bedside. 

## 2015-05-15 ENCOUNTER — Ambulatory Visit: Payer: Medicare PPO | Admitting: Gastroenterology

## 2015-05-15 ENCOUNTER — Other Ambulatory Visit: Payer: Self-pay | Admitting: Internal Medicine

## 2015-05-16 NOTE — Telephone Encounter (Signed)
Rx printed, awaiting MD signature.  

## 2015-05-16 NOTE — Telephone Encounter (Signed)
Pt is requesting refill on Ambien.  Last OV: 01/16/2015 Last Fill: 01/16/2015 #30 and 3RF UDS: Not needed  Please advise.

## 2015-05-16 NOTE — Telephone Encounter (Signed)
Rx faxed to [facility].  

## 2015-05-16 NOTE — Telephone Encounter (Signed)
Letter printed and mailed to Pt informing she is due for routine office visit. Instructed her to call and schedule appt.

## 2015-05-16 NOTE — Telephone Encounter (Signed)
Ok 30 , 1 RF advise pt -- needs OV before next RF

## 2015-05-22 ENCOUNTER — Telehealth: Payer: Self-pay | Admitting: *Deleted

## 2015-05-22 NOTE — Telephone Encounter (Signed)
Received fax requesting PA on Zolpidem/SLS 02/13

## 2015-06-06 ENCOUNTER — Other Ambulatory Visit: Payer: Self-pay | Admitting: Internal Medicine

## 2015-07-01 ENCOUNTER — Emergency Department (HOSPITAL_BASED_OUTPATIENT_CLINIC_OR_DEPARTMENT_OTHER)
Admission: EM | Admit: 2015-07-01 | Discharge: 2015-07-01 | Disposition: A | Discharge status: Home / Self Care | Payer: Medicare Other | Attending: Emergency Medicine | Admitting: Emergency Medicine

## 2015-07-01 ENCOUNTER — Emergency Department (HOSPITAL_BASED_OUTPATIENT_CLINIC_OR_DEPARTMENT_OTHER): Payer: Medicare Other

## 2015-07-01 ENCOUNTER — Encounter (HOSPITAL_BASED_OUTPATIENT_CLINIC_OR_DEPARTMENT_OTHER): Payer: Self-pay | Admitting: Emergency Medicine

## 2015-07-01 DIAGNOSIS — I10 Essential (primary) hypertension: Secondary | ICD-10-CM | POA: Insufficient documentation

## 2015-07-01 DIAGNOSIS — Z79899 Other long term (current) drug therapy: Secondary | ICD-10-CM | POA: Insufficient documentation

## 2015-07-01 DIAGNOSIS — I2510 Atherosclerotic heart disease of native coronary artery without angina pectoris: Secondary | ICD-10-CM | POA: Diagnosis not present

## 2015-07-01 DIAGNOSIS — R079 Chest pain, unspecified: Secondary | ICD-10-CM | POA: Diagnosis present

## 2015-07-01 DIAGNOSIS — G8929 Other chronic pain: Secondary | ICD-10-CM | POA: Diagnosis not present

## 2015-07-01 DIAGNOSIS — E039 Hypothyroidism, unspecified: Secondary | ICD-10-CM | POA: Diagnosis not present

## 2015-07-01 DIAGNOSIS — Z7982 Long term (current) use of aspirin: Secondary | ICD-10-CM | POA: Diagnosis not present

## 2015-07-01 DIAGNOSIS — Z8719 Personal history of other diseases of the digestive system: Secondary | ICD-10-CM | POA: Insufficient documentation

## 2015-07-01 DIAGNOSIS — J45909 Unspecified asthma, uncomplicated: Secondary | ICD-10-CM | POA: Insufficient documentation

## 2015-07-01 DIAGNOSIS — M940 Chondrocostal junction syndrome [Tietze]: Secondary | ICD-10-CM | POA: Insufficient documentation

## 2015-07-01 HISTORY — DX: Other ill-defined heart diseases: I51.89

## 2015-07-01 LAB — CBC
HCT: 41.6 % (ref 36.0–46.0)
Hemoglobin: 13.7 g/dL (ref 12.0–15.0)
MCH: 30.9 pg (ref 26.0–34.0)
MCHC: 32.9 g/dL (ref 30.0–36.0)
MCV: 93.7 fL (ref 78.0–100.0)
Platelets: 371 10*3/uL (ref 150–400)
RBC: 4.44 MIL/uL (ref 3.87–5.11)
RDW: 13.6 % (ref 11.5–15.5)
WBC: 10 10*3/uL (ref 4.0–10.5)

## 2015-07-01 LAB — TROPONIN I: Troponin I: <0.03 ng/mL (ref <0.031)

## 2015-07-01 LAB — BASIC METABOLIC PANEL
Anion gap: 8 (ref 5–15)
BUN: 15 mg/dL (ref 6–20)
CO2: 20 mmol/L — ABNORMAL LOW (ref 22–32)
Calcium: 9.1 mg/dL (ref 8.9–10.3)
Chloride: 111 mmol/L (ref 101–111)
Creatinine, Ser: 1.03 mg/dL — ABNORMAL HIGH (ref 0.44–1.00)
GFR calc Af Amer: >60 mL/min (ref >60)
GFR calc non Af Amer: 54 mL/min — ABNORMAL LOW (ref >60)
Glucose, Bld: 127 mg/dL — ABNORMAL HIGH (ref 65–99)
Potassium: 4 mmol/L (ref 3.5–5.1)
Sodium: 139 mmol/L (ref 135–145)

## 2015-07-01 LAB — SEDIMENTATION RATE: Sed Rate: 32 mm/hr — ABNORMAL HIGH (ref 0–22)

## 2015-07-01 MED ORDER — KETOROLAC TROMETHAMINE 30 MG/ML IJ SOLN
15.0000 mg | Freq: Once | INTRAMUSCULAR | Status: AC
  Administered 2015-07-01: 15 mg via INTRAVENOUS
  Filled 2015-07-01: qty 1

## 2015-07-01 MED ORDER — IBUPROFEN 400 MG PO TABS: 400.0000 mg | ORAL_TABLET | Freq: Four times a day (QID) | ORAL | Status: DC | PRN

## 2015-07-01 NOTE — ED Provider Notes (Signed)
CSN: [id number]     Arrival date & time 07/01/15  1538 History  By signing my name below, I, Altamease Oiler, attest that this documentation has been prepared under the direction and in the presence of Leonard Schwartz, MD. Electronically Signed: Altamease Oiler, ED Scribe. 07/01/2015. 4:12 PM   Chief Complaint  Patient presents with  . Chest Pain   The history is provided by the patient. No language interpreter was used.   Kayla [name] is a 70 y.o. female with history of CAD, diastolic dysfunction, HTN, HLD, asthma, GERD, hypothyroidism, and fibromyalgia who presents to the Emergency Department complaining of intermittent, sharp/grabbing,lasting for seconds, central and left-sided chest pain with onset approximately 14 hours ago.  Each pains last for seconds. Pt states that she is in the process of moving and picked up a bag of books recently. She also reports that occasionally her whole body feels as if it is vibrating. Pt denies recent flu-like illness, nausea, vomiting, and dizziness. No history of MI.    Past Medical History  Diagnosis Date  . Asthma   . GERD (gastroesophageal reflux disease)   . Hypertension   . Hypothyroid   . Chronic LBP   . B12 deficiency   . IBS (irritable bowel syndrome)   . CAD (coronary artery disease) 10/2007    abnormal stress test and nonobstructive CAD per cardiac cath performed on October 22, 2007, per Dr. Legrand Como Copper  . Hyperlipemia   . Fibromyalgia   . Diastolic dysfunction    Past Surgical History  Procedure Laterality Date  . Neck surgery  1998  . Cholecystectomy    . Cesarean section     Family History  Problem Relation Age of Onset  . Heart attack Father 35  . Lung cancer      uncle, non smoker  . Leukemia      uncle  . Colon cancer Neg Hx   . Breast cancer Neg Hx   . Diabetes Neg Hx    Social History  Substance Use Topics  . Smoking status: Never Smoker   . Smokeless tobacco: Never Used  . Alcohol Use: No   OB History    No data  available     Review of Systems  10 Systems reviewed and all are negative for acute change except as noted in the HPI.  Allergies  Ace inhibitors  Home Medications   Prior to Admission medications   Medication Sig Start Date End Date Taking? Authorizing Provider  aspirin 81 MG tablet Take 81 mg by mouth daily.    Historical Provider, MD  cefpodoxime (VANTIN) 100 MG tablet Take 1 tablet (100 mg total) by mouth 2 (two) times daily. 05/03/15   Deno Etienne, DO  Cyanocobalamin (VITAMIN B 12 PO) Take 1 tablet by mouth daily.    Historical Provider, MD  ferrous sulfate 325 (65 FE) MG tablet Take 1 tablet (325 mg total) by mouth 2 (two) times daily with a meal. 01/23/15   Colon Branch, MD  fluconazole (DIFLUCAN) 150 MG tablet Take 1 tablet (150 mg total) by mouth once. 05/03/15   Deno Etienne, DO  ibuprofen (ADVIL,MOTRIN) 400 MG tablet Take 1 tablet (400 mg total) by mouth every 6 (six) hours as needed. 07/01/15   Leonard Schwartz, MD  levothyroxine (SYNTHROID, LEVOTHROID) 200 MCG tablet Take 1 tablet (200 mcg total) by mouth daily before breakfast. 12/20/14   Colon Branch, MD  levothyroxine (SYNTHROID, LEVOTHROID) 200 MCG tablet Take 1 tablet (200  mcg total) by mouth daily before breakfast. 02/09/15   Colon Branch, MD  MATZIM LA 360 MG 24 hr tablet TAKE 1 TABLET BY MOUTH DAILY. 06/06/15   Colon Branch, MD  nitroGLYCERIN (NITROSTAT) 0.4 MG SL tablet Place 1 tablet (0.4 mg total) under the tongue every 5 (five) minutes as needed for chest pain. Patient not taking: Reported on 01/16/2015 05/18/14   Colon Branch, MD  phenazopyridine (PYRIDIUM) 200 MG tablet Take 1 tablet (200 mg total) by mouth 3 (three) times daily. 05/03/15   Deno Etienne, DO  zolpidem (AMBIEN) 5 MG tablet Take 1 tablet (5 mg total) by mouth at bedtime as needed for sleep. 05/16/15   Colon Branch, MD   BP 144/89 mmHg  Pulse 89  Temp(Src) 98.5 F (36.9 C) (Oral)  Resp 18  Ht 5\' 5"  (1.651 m)  Wt 180 lb (81.647 kg)  BMI 29.95 kg/m2  SpO2 100% Physical Exam   Constitutional: She is oriented to person, place, and time. She appears well-developed and well-nourished. No distress.  HENT:  Head: Normocephalic and atraumatic.  Eyes: Pupils are equal, round, and reactive to light.  Neck: Normal range of motion.  Cardiovascular: Normal rate and intact distal pulses.   Pulmonary/Chest: No respiratory distress.    Areas of reproducible pain to palpation  Abdominal: Normal appearance. She exhibits no distension.  Musculoskeletal: Normal range of motion.  Neurological: She is alert and oriented to person, place, and time. No cranial nerve deficit.  Skin: Skin is warm and dry. No rash noted.  Psychiatric: She has a normal mood and affect. Her behavior is normal.  Nursing note and vitals reviewed.   ED Course  Procedures (including critical care time) Medications  ketorolac (TORADOL) 30 MG/ML injection 15 mg (15 mg Intravenous Given 07/01/15 1703)    DIAGNOSTIC STUDIES: Oxygen Saturation is 100% on RA,  normal by my interpretation.    COORDINATION OF CARE: 4:07 PM Discussed treatment plan which includes lab work, CXR, EKG with pt at bedside and pt agreed to plan.  Labs Review Labs Reviewed  BASIC METABOLIC PANEL - Abnormal; Notable for the following:    CO2 20 (*)    Glucose, Bld 127 (*)    Creatinine, Ser 1.03 (*)    GFR calc non Af Amer 54 (*)    All other components within normal limits  CBC  TROPONIN I  SEDIMENTATION RATE    Imaging Review Dg Chest 2 View  07/01/2015  CLINICAL DATA:  Initial encounter. Intermittent sternal region chest pain leading under the left breast, beginning today. Chronic shortness of breath. EXAM: CHEST  2 VIEW COMPARISON:  05/18/2014 FINDINGS: Cardiac silhouette is normal in size and configuration. No mediastinal or hilar masses or evidence of adenopathy. Clear lungs.  No pleural effusion or pneumothorax. Previous anterior cervical spine fusion, stable. Bony thorax is demineralized. IMPRESSION: No acute  cardiopulmonary disease. Electronically Signed   By: Lajean Manes M.D.   On: 07/01/2015 16:51   I have personally reviewed and evaluated these images and lab results as part of my medical decision-making.   EKG Interpretation   Date/Time:  Saturday July 01 2015 15:45:08 EDT Ventricular Rate:  85 PR Interval:  154 QRS Duration: 84 QT Interval:  360 QTC Calculation: 428 R Axis:   31 Text Interpretation:  Normal sinus rhythm Cannot rule out Anterior infarct  , age undetermined Abnormal ECG Confirmed by Hillery Bhalla  MD, Lucette Kratz ([id number]) on  07/01/2015 4:02:32 PM  MDM   Final diagnoses:  Acute costochondritis    I personally performed the services described in this documentation, which was scribed in my presence. The recorded information has been reviewed and considered.    Leonard Schwartz, MD 07/01/15 1723

## 2015-07-01 NOTE — ED Notes (Signed)
Patient states that starting at about 2 am the patient has had intermittent chest spasms to her central chest and her left chest. The patient reports that the pain takes her breath away

## 2015-07-01 NOTE — Discharge Instructions (Signed)
Costochondritis  Costochondritis is a condition in which the tissue (cartilage) that connects your ribs with your breastbone (sternum) becomes irritated. It causes pain in the chest and rib area. It usually goes away on its own over time.  HOME CARE  · Avoid activities that wear you out.  · Do not strain your ribs. Avoid activities that use your:    Chest.    Belly.    Side muscles.  · Put ice on the area for the first 2 days after the pain starts.    Put ice in a plastic bag.    Place a towel between your skin and the bag.    Leave the ice on for 20 minutes, 2-3 times a day.  · Only take medicine as told by your doctor.  GET HELP IF:  · You have redness or puffiness (swelling) in the rib area.  · Your pain does not go away with rest or medicine.  GET HELP RIGHT AWAY IF:   · Your pain gets worse.  · You are very uncomfortable.  · You have trouble breathing.  · You cough up blood.  · You start sweating or throwing up (vomiting).  · You have a fever or lasting symptoms for more than 2-3 days.  · You have a fever and your symptoms suddenly get worse.  MAKE SURE YOU:   · Understand these instructions.  · Will watch your condition.  · Will get help right away if you are not doing well or get worse.     This information is not intended to replace advice given to you by your health care provider. Make sure you discuss any questions you have with your health care provider.     Document Released: 09/11/2007 Document Revised: 11/25/2012 Document Reviewed: 10/27/2012  Elsevier Interactive Patient Education ©2016 [employer] Inc[employer]

## 2015-07-05 ENCOUNTER — Other Ambulatory Visit: Payer: Self-pay | Admitting: Internal Medicine

## 2015-07-17 ENCOUNTER — Telehealth: Payer: Self-pay | Admitting: *Deleted

## 2015-07-17 NOTE — Telephone Encounter (Signed)
PA initiated on [email or web address]. Awaiting determination. JG//CMA 

## 2015-07-18 NOTE — Telephone Encounter (Signed)
Received Denial letter from [facility] stating that Zolpidem authorization requires the following: (1) You have a history of failure, contraindication, or intolerance to the following: [a] Rozerem [only one given]/SLS  Please Advise.

## 2015-07-18 NOTE — Telephone Encounter (Signed)
Additional information completed and faxed to [facility]. JG//CMA

## 2015-07-19 MED ORDER — RAMELTEON 8 MG PO TABS: 8.0000 mg | ORAL_TABLET | Freq: Every day | ORAL | Status: DC

## 2015-07-19 NOTE — Addendum Note (Signed)
Addended by: Rockwell Germany on: 07/19/2015 03:33 PM   Modules accepted: Orders, Medications

## 2015-07-19 NOTE — Telephone Encounter (Signed)
Patient informed, understood & agreed; new Rx to pharmacy, pt will D/C medication and call if any problems/SLS 04/12

## 2015-07-19 NOTE — Telephone Encounter (Signed)
Advise patient: Due to insurance constraints , ok to try Rozerem  8 mg one by mouth daily at bedtime #30 and 3 refills. If she does not like to try, okay to refill Ambien and likely will have to pay out of pocket

## 2015-07-25 ENCOUNTER — Telehealth: Payer: Self-pay | Admitting: *Deleted

## 2015-07-25 NOTE — Telephone Encounter (Signed)
error 

## 2015-08-05 ENCOUNTER — Other Ambulatory Visit: Payer: Self-pay | Admitting: Internal Medicine

## 2015-08-07 ENCOUNTER — Telehealth: Payer: Self-pay | Admitting: *Deleted

## 2015-08-07 NOTE — Telephone Encounter (Signed)
Please call pt to schedule CPE

## 2015-08-07 NOTE — Telephone Encounter (Signed)
Medication filled to pharmacy as requested.   

## 2015-08-10 NOTE — Telephone Encounter (Signed)
LVM. Advising pt to call back to schedule CPE per nurse.

## 2015-08-11 ENCOUNTER — Encounter: Payer: Self-pay | Admitting: *Deleted

## 2015-08-11 NOTE — Telephone Encounter (Signed)
Letter mailed to pt.  

## 2015-08-21 ENCOUNTER — Other Ambulatory Visit: Payer: Self-pay | Admitting: Internal Medicine

## 2015-09-08 ENCOUNTER — Encounter (HOSPITAL_BASED_OUTPATIENT_CLINIC_OR_DEPARTMENT_OTHER): Payer: Self-pay | Admitting: *Deleted

## 2015-09-08 ENCOUNTER — Emergency Department (HOSPITAL_BASED_OUTPATIENT_CLINIC_OR_DEPARTMENT_OTHER)
Admission: EM | Admit: 2015-09-08 | Discharge: 2015-09-08 | Disposition: A | Discharge status: Home / Self Care | Payer: Medicare Other | Attending: Emergency Medicine | Admitting: Emergency Medicine

## 2015-09-08 DIAGNOSIS — J45909 Unspecified asthma, uncomplicated: Secondary | ICD-10-CM | POA: Diagnosis not present

## 2015-09-08 DIAGNOSIS — Z79899 Other long term (current) drug therapy: Secondary | ICD-10-CM | POA: Insufficient documentation

## 2015-09-08 DIAGNOSIS — S161XXA Strain of muscle, fascia and tendon at neck level, initial encounter: Secondary | ICD-10-CM | POA: Diagnosis not present

## 2015-09-08 DIAGNOSIS — I2510 Atherosclerotic heart disease of native coronary artery without angina pectoris: Secondary | ICD-10-CM | POA: Insufficient documentation

## 2015-09-08 DIAGNOSIS — Z7982 Long term (current) use of aspirin: Secondary | ICD-10-CM | POA: Insufficient documentation

## 2015-09-08 DIAGNOSIS — I10 Essential (primary) hypertension: Secondary | ICD-10-CM | POA: Diagnosis not present

## 2015-09-08 DIAGNOSIS — Y999 Unspecified external cause status: Secondary | ICD-10-CM | POA: Insufficient documentation

## 2015-09-08 DIAGNOSIS — T148XXA Other injury of unspecified body region, initial encounter: Secondary | ICD-10-CM

## 2015-09-08 DIAGNOSIS — N390 Urinary tract infection, site not specified: Secondary | ICD-10-CM | POA: Diagnosis not present

## 2015-09-08 DIAGNOSIS — E039 Hypothyroidism, unspecified: Secondary | ICD-10-CM | POA: Insufficient documentation

## 2015-09-08 DIAGNOSIS — Y929 Unspecified place or not applicable: Secondary | ICD-10-CM | POA: Insufficient documentation

## 2015-09-08 DIAGNOSIS — X501XXA Overexertion from prolonged static or awkward postures, initial encounter: Secondary | ICD-10-CM | POA: Insufficient documentation

## 2015-09-08 DIAGNOSIS — Y939 Activity, unspecified: Secondary | ICD-10-CM | POA: Insufficient documentation

## 2015-09-08 DIAGNOSIS — E785 Hyperlipidemia, unspecified: Secondary | ICD-10-CM | POA: Diagnosis not present

## 2015-09-08 DIAGNOSIS — M542 Cervicalgia: Secondary | ICD-10-CM | POA: Diagnosis present

## 2015-09-08 LAB — URINALYSIS, ROUTINE W REFLEX MICROSCOPIC
Glucose, UA: NEGATIVE mg/dL
Hgb urine dipstick: NEGATIVE
Ketones, ur: 15 mg/dL — AB
Nitrite: NEGATIVE
Protein, ur: NEGATIVE mg/dL
Specific Gravity, Urine: 1.045 — ABNORMAL HIGH (ref 1.005–1.030)
pH: 5 (ref 5.0–8.0)

## 2015-09-08 LAB — URINE MICROSCOPIC-ADD ON: RBC / HPF: NONE SEEN RBC/hpf (ref 0–5)

## 2015-09-08 MED ORDER — CEPHALEXIN 500 MG PO CAPS: 1000.0000 mg | ORAL_CAPSULE | Freq: Two times a day (BID) | ORAL | Status: DC

## 2015-09-08 MED ORDER — PHENAZOPYRIDINE HCL 200 MG PO TABS: 200.0000 mg | ORAL_TABLET | Freq: Three times a day (TID) | ORAL | Status: DC

## 2015-09-08 MED ORDER — CYCLOBENZAPRINE HCL 10 MG PO TABS: 10.0000 mg | ORAL_TABLET | Freq: Every evening | ORAL | Status: DC | PRN

## 2015-09-08 MED ORDER — CEPHALEXIN 250 MG PO CAPS
1000.0000 mg | ORAL_CAPSULE | Freq: Once | ORAL | Status: AC
Start: 2015-09-08 — End: 2015-09-08
  Administered 2015-09-08: 1000 mg via ORAL
  Filled 2015-09-08: qty 4

## 2015-09-08 MED FILL — CEPHALEXIN 500 MG CAPSULE: 500 | 5 days supply | Qty: 20 | Fill #0

## 2015-09-08 MED FILL — PHENAZOPYRIDINE 200 MG TAB: 200 | 3 days supply | Qty: 9 | Fill #0

## 2015-09-08 MED FILL — CYCLOBENZAPRINE 10 MG TAB: 10 | 20 days supply | Qty: 20 | Fill #0

## 2015-09-08 NOTE — ED Notes (Signed)
Muscle pain since yesterday. She feels like she has a knot in her back. Back and lower abdominal pain since yesterday.

## 2015-09-08 NOTE — ED Provider Notes (Signed)
CSN: [id number]     Arrival date & time 09/08/15  1447 History   First MD Initiated Contact with Patient 09/08/15 1457     Chief Complaint  Patient presents with  . Muscle Pain     (Consider location/radiation/quality/duration/timing/severity/associated sxs/prior Treatment) Patient is a 70 y.o. female presenting with musculoskeletal pain.  Muscle Pain This is a recurrent problem. The current episode started more than 2 days ago. The problem occurs constantly. Pertinent negatives include no chest pain and no shortness of breath. Associated symptoms comments: Back and neck pain. The symptoms are aggravated by twisting. Nothing relieves the symptoms. She has tried nothing for the symptoms. The treatment provided no relief.    Past Medical History  Diagnosis Date  . Asthma   . GERD (gastroesophageal reflux disease)   . Hypertension   . Hypothyroid   . Chronic LBP   . B12 deficiency   . IBS (irritable bowel syndrome)   . CAD (coronary artery disease) 10/2007    abnormal stress test and nonobstructive CAD per cardiac cath performed on October 22, 2007, per Dr. Legrand Como Copper  . Hyperlipemia   . Fibromyalgia   . Diastolic dysfunction    Past Surgical History  Procedure Laterality Date  . Neck surgery  1998  . Cholecystectomy    . Cesarean section     Family History  Problem Relation Age of Onset  . Heart attack Father 14  . Lung cancer      uncle, non smoker  . Leukemia      uncle  . Colon cancer Neg Hx   . Breast cancer Neg Hx   . Diabetes Neg Hx    Social History  Substance Use Topics  . Smoking status: Never Smoker   . Smokeless tobacco: Never Used  . Alcohol Use: No   OB History    No data available     Review of Systems  Constitutional: Negative for fever.  Eyes: Negative for pain.  Respiratory: Negative for cough and shortness of breath.   Cardiovascular: Negative for chest pain.  Endocrine: Negative for polydipsia and polyuria.  Musculoskeletal: Positive for  myalgias, back pain, neck pain and neck stiffness.  Skin: Negative for color change and rash.  All other systems reviewed and are negative.     Allergies  Ace inhibitors  Home Medications   Prior to Admission medications   Medication Sig Start Date End Date Taking? Authorizing Provider  aspirin 81 MG tablet Take 81 mg by mouth daily.    Historical Provider, MD  cephALEXin (KEFLEX) 500 MG capsule Take 2 capsules (1,000 mg total) by mouth 2 (two) times daily. 09/08/15   Merrily Pew, MD  Cyanocobalamin (VITAMIN B 12 PO) Take 1 tablet by mouth daily.    Historical Provider, MD  cyclobenzaprine (FLEXERIL) 10 MG tablet Take 1 tablet (10 mg total) by mouth at bedtime as needed for muscle spasms. 09/08/15   Merrily Pew, MD  ferrous sulfate 325 (65 FE) MG tablet Take 1 tablet (325 mg total) by mouth 2 (two) times daily with a meal. 01/23/15   Colon Branch, MD  ibuprofen (ADVIL,MOTRIN) 400 MG tablet Take 1 tablet (400 mg total) by mouth every 6 (six) hours as needed. 07/01/15   Leonard Schwartz, MD  levothyroxine (SYNTHROID, LEVOTHROID) 200 MCG tablet Take 1 tablet (200 mcg total) by mouth daily before breakfast. 12/20/14   Colon Branch, MD  MATZIM LA 360 MG 24 hr tablet TAKE 1 TABLET BY MOUTH DAILY.  08/07/15   Colon Branch, MD  nitroGLYCERIN (NITROSTAT) 0.4 MG SL tablet Place 1 tablet (0.4 mg total) under the tongue every 5 (five) minutes as needed for chest pain. Patient not taking: Reported on 01/16/2015 05/18/14   Colon Branch, MD  phenazopyridine (PYRIDIUM) 200 MG tablet Take 1 tablet (200 mg total) by mouth 3 (three) times daily. 09/08/15   Merrily Pew, MD  ramelteon (ROZEREM) 8 MG tablet Take 1 tablet (8 mg total) by mouth at bedtime. 07/19/15   Colon Branch, MD   BP 144/83 mmHg  Pulse 61  Temp(Src) 98.2 F (36.8 C) (Oral)  Resp 18  Ht 5\' 5"  (1.651 m)  Wt 200 lb (90.719 kg)  BMI 33.28 kg/m2  SpO2 96% Physical Exam  Constitutional: She appears well-developed and well-nourished.  HENT:  Head:  Normocephalic and atraumatic.  Neck: Normal range of motion.  Cardiovascular: Normal rate and regular rhythm.   Pulmonary/Chest: No stridor. No respiratory distress.  Abdominal: She exhibits no distension. There is tenderness (slightly, suprapbuic. ).  Musculoskeletal: She exhibits tenderness (paraspinal thoracic area, no midline pain). She exhibits no edema.  Neurological: She is alert.  Nursing note and vitals reviewed.   ED Course  Procedures (including critical care time) Labs Review Labs Reviewed  URINALYSIS, ROUTINE W REFLEX MICROSCOPIC (NOT AT [facility]) - Abnormal; Notable for the following:    Color, Urine AMBER (*)    APPearance CLOUDY (*)    Specific Gravity, Urine 1.045 (*)    Bilirubin Urine SMALL (*)    Ketones, ur 15 (*)    Leukocytes, UA SMALL (*)    All other components within normal limits  URINE MICROSCOPIC-ADD ON - Abnormal; Notable for the following:    Squamous Epithelial / LPF 6-30 (*)    Bacteria, UA MANY (*)    Casts HYALINE CASTS (*)    All other components within normal limits  URINE CULTURE    Imaging Review No results found. I have personally reviewed and evaluated these images and lab results as part of my medical decision-making.   EKG Interpretation None      MDM   Final diagnoses:  Muscle strain  UTI (lower urinary tract infection)    This is a 70 year old female who presents with likely muscular back pain. She is paraspinal tenderness after moving many things in her house over the last week. She also has UTI with suprapubic tenderness. Source of back pain goes is no red flags for serious injuries, normal neurologic exam no fevers, no midline back tenderness. No evidence of sepsis from UTI. We'll discharge her on Keflex and Flexeril and she'll follow with her primary doctor for reevaluation of improvement. Also will use more conservative measures as far as stretching, massage and heat for her symptoms.  New Prescriptions: New Prescriptions    CEPHALEXIN (KEFLEX) 500 MG CAPSULE    Take 2 capsules (1,000 mg total) by mouth 2 (two) times daily.   CYCLOBENZAPRINE (FLEXERIL) 10 MG TABLET    Take 1 tablet (10 mg total) by mouth at bedtime as needed for muscle spasms.     I have personally and contemperaneously reviewed labs and imaging and used in my decision making as above.   A medical screening exam was performed and I feel the patient has had an appropriate workup for their chief complaint at this time and likelihood of emergent condition existing is low and thus workup can continue on an outpatient basis.. Their vital signs are stable. They have  been counseled on decision, discharge, follow up and which symptoms necessitate immediate return to the emergency department.  They verbally stated understanding and agreement with plan and discharged in stable condition.       Merrily Pew, MD 09/08/15 [id number]

## 2015-09-09 LAB — URINE CULTURE

## 2015-09-14 ENCOUNTER — Other Ambulatory Visit: Payer: Self-pay | Admitting: Internal Medicine

## 2015-09-18 ENCOUNTER — Other Ambulatory Visit: Payer: Self-pay | Admitting: Internal Medicine

## 2015-10-28 ENCOUNTER — Other Ambulatory Visit: Payer: Self-pay | Admitting: Internal Medicine

## 2015-11-01 ENCOUNTER — Encounter: Payer: Self-pay | Admitting: Medical

## 2015-11-01 ENCOUNTER — Other Ambulatory Visit: Payer: Self-pay | Admitting: Medical

## 2015-11-01 ENCOUNTER — Ambulatory Visit (HOSPITAL_BASED_OUTPATIENT_CLINIC_OR_DEPARTMENT_OTHER)
Admission: RE | Admit: 2015-11-01 | Discharge: 2015-11-01 | Disposition: A | Discharge status: Home / Self Care | Payer: Medicare Other | Source: Ambulatory Visit | Attending: Medical | Admitting: Medical

## 2015-11-01 ENCOUNTER — Ambulatory Visit (INDEPENDENT_AMBULATORY_CARE_PROVIDER_SITE_OTHER): Payer: Medicare Other | Admitting: Medical

## 2015-11-01 VITALS — BP 130/74 | HR 68 | Temp 98.1°F | Ht 65.0 in | Wt 227.6 lb

## 2015-11-01 DIAGNOSIS — M8580 Other specified disorders of bone density and structure, unspecified site: Secondary | ICD-10-CM | POA: Insufficient documentation

## 2015-11-01 DIAGNOSIS — R1030 Lower abdominal pain, unspecified: Secondary | ICD-10-CM

## 2015-11-01 DIAGNOSIS — M47894 Other spondylosis, thoracic region: Secondary | ICD-10-CM | POA: Insufficient documentation

## 2015-11-01 DIAGNOSIS — R918 Other nonspecific abnormal finding of lung field: Secondary | ICD-10-CM | POA: Insufficient documentation

## 2015-11-01 DIAGNOSIS — L989 Disorder of the skin and subcutaneous tissue, unspecified: Secondary | ICD-10-CM | POA: Diagnosis not present

## 2015-11-01 DIAGNOSIS — M2550 Pain in unspecified joint: Secondary | ICD-10-CM

## 2015-11-01 DIAGNOSIS — I517 Cardiomegaly: Secondary | ICD-10-CM | POA: Diagnosis not present

## 2015-11-01 DIAGNOSIS — Z981 Arthrodesis status: Secondary | ICD-10-CM | POA: Diagnosis not present

## 2015-11-01 DIAGNOSIS — Z0000 Encounter for general adult medical examination without abnormal findings: Secondary | ICD-10-CM

## 2015-11-01 DIAGNOSIS — R21 Rash and other nonspecific skin eruption: Secondary | ICD-10-CM

## 2015-11-01 DIAGNOSIS — Z8639 Personal history of other endocrine, nutritional and metabolic disease: Secondary | ICD-10-CM

## 2015-11-01 DIAGNOSIS — M4184 Other forms of scoliosis, thoracic region: Secondary | ICD-10-CM | POA: Diagnosis not present

## 2015-11-01 DIAGNOSIS — R768 Other specified abnormal immunological findings in serum: Secondary | ICD-10-CM

## 2015-11-01 DIAGNOSIS — M546 Pain in thoracic spine: Secondary | ICD-10-CM | POA: Insufficient documentation

## 2015-11-01 DIAGNOSIS — M545 Low back pain: Secondary | ICD-10-CM

## 2015-11-01 LAB — POC URINALSYSI DIPSTICK (AUTOMATED)
Blood, UA: NEGATIVE
Glucose, UA: NEGATIVE
Ketones, UA: NEGATIVE
Nitrite, UA: NEGATIVE
Spec Grav, UA: >=1.03
Urobilinogen, UA: 2
pH, UA: 6

## 2015-11-01 MED ORDER — KETOROLAC TROMETHAMINE 60 MG/2ML IM SOLN
60.0000 mg | Freq: Once | INTRAMUSCULAR | Status: AC
  Administered 2015-11-01: 60 mg via INTRAMUSCULAR

## 2015-11-01 MED ORDER — CIPROFLOXACIN HCL 250 MG PO TABS: 250.0000 mg | ORAL_TABLET | Freq: Two times a day (BID) | ORAL | 0 refills | Status: DC

## 2015-11-01 NOTE — Progress Notes (Signed)
Pre visit review using our clinic review tool, if applicable. No additional management support is needed unless otherwise documented below in the visit note. 

## 2015-11-01 NOTE — Progress Notes (Signed)
Subjective:    Patient ID: Kayla [name], female    DOB: [birth year], 70 y.o.   MRN: [id number]  HPI    Pt in with some diffuse body pain. Pt has hx of fibromyalgia. She explains also history of severe costochondritis episode around March for about 3 weeks. She reports some intermittent costochondritis type pain since then over past 3 months. But none for about 2 months per pt.  Pt recently been very stressed over mold found in her house. She had to move out and insurance does not cover mold. So she can't sell house.   Pt has diffuse body pain. But worse in her upper back. Pt was seen in ED in June for this Also bilateral knee pain.   Also history of cspine fx and titanium plates placed years ago per pt.  Pt thinks she may have early uti. Some pressure in her suprapubic area.    Review of Systems  Constitutional: Negative for chills, fatigue and fever.  Respiratory: Negative for cough, chest tightness, shortness of breath and wheezing.   Cardiovascular: Negative for chest pain and palpitations.  Genitourinary: Negative for dysuria, frequency and urgency.       Suprapubic pressure.  Musculoskeletal: Positive for arthralgias, back pain and myalgias. Negative for neck pain and neck stiffness.  Skin: Positive for rash.       Mid back- rt side skin lesion.  Hematological: Negative for adenopathy. Does not bruise/bleed easily.    Past Medical History:  Diagnosis Date  . Asthma   . B12 deficiency   . CAD (coronary artery disease) 10/2007   abnormal stress test and nonobstructive CAD per cardiac cath performed on October 22, 2007, per Dr. Legrand Como Copper  . Chronic LBP   . Diastolic dysfunction   . Fibromyalgia   . GERD (gastroesophageal reflux disease)   . Hyperlipemia   . Hypertension   . Hypothyroid   . IBS (irritable bowel syndrome)      Social History   Social History  . Marital status: Widowed    Spouse name: N/A  . Number of children: 2  . Years of education: N/A    Occupational History  . Not on file.   Social History Main Topics  . Smoking status: Never Smoker  . Smokeless tobacco: Never Used  . Alcohol use No  . Drug use: No  . Sexual activity: Yes    Birth control/ protection: Post-menopausal   Other Topics Concern  . Not on file   Social History Narrative   Lost husband 2009, lost mother 2016    Past Surgical History:  Procedure Laterality Date  . CESAREAN SECTION    . CHOLECYSTECTOMY    . NECK SURGERY  1998    Family History  Problem Relation Age of Onset  . Heart attack Father 73  . Lung cancer      uncle, non smoker  . Leukemia      uncle  . Colon cancer Neg Hx   . Breast cancer Neg Hx   . Diabetes Neg Hx     Allergies  Allergen Reactions  . Ace Inhibitors Anaphylaxis    REACTION: glossal edema    Current Outpatient Prescriptions on File Prior to Visit  Medication Sig Dispense Refill  . aspirin 81 MG tablet Take 81 mg by mouth daily.    . Cyanocobalamin (VITAMIN B 12 PO) Take 1 tablet by mouth daily.    . cyclobenzaprine (FLEXERIL) 10 MG tablet Take 1 tablet (10  mg total) by mouth at bedtime as needed for muscle spasms. 20 tablet 0  . diltiazem (MATZIM LA) 360 MG 24 hr tablet Take 1 tablet (360 mg total) by mouth daily. 30 tablet 0  . ferrous sulfate 325 (65 FE) MG tablet Take 1 tablet (325 mg total) by mouth 2 (two) times daily with a meal. 60 tablet 6  . ibuprofen (ADVIL,MOTRIN) 400 MG tablet Take 1 tablet (400 mg total) by mouth every 6 (six) hours as needed. 15 tablet 0  . levothyroxine (SYNTHROID, LEVOTHROID) 200 MCG tablet Take 1 tablet (200 mcg total) by mouth daily before breakfast. 15 tablet 0  . nitroGLYCERIN (NITROSTAT) 0.4 MG SL tablet Place 1 tablet (0.4 mg total) under the tongue every 5 (five) minutes as needed for chest pain. 20 tablet 3  . ramelteon (ROZEREM) 8 MG tablet Take 1 tablet (8 mg total) by mouth at bedtime. 30 tablet 3   No current facility-administered medications on file prior to  visit.     BP 130/74 (BP Location: Left Arm, Patient Position: Sitting, Cuff Size: Normal)   Pulse 68   Temp 98.1 F (36.7 C) (Oral)   Ht 5\' 5"  (1.651 m)   Wt 227 lb 9.6 oz (103.2 kg)   SpO2 97%   BMI 37.87 kg/m       Objective:   Physical Exam  General Mental Status- Alert. General Appearance- Not in acute distress.    Neck Carotid Arteries- Normal color. Moisture- Normal Moisture. No carotid bruits. No JVD.  Chest and Lung Exam Auscultation: Breath Sounds:-Normal.  Cardiovascular Auscultation:Rythm- Regular. Murmurs & Other Heart Sounds:Auscultation of the heart reveals- No Murmurs.  Abdomen Inspection:-Inspeection Normal. Palpation/Percussion:Note:No mass. Palpation and Percussion of the abdomen reveal- Non Tender, Non Distended + BS, no rebound or guarding.   Neurologic Cranial Nerve exam:- CN III-XII intact(No nystagmus), symmetric smile. Strength:- 5/5 equal and symmetric strength both upper and lower extremities.  Skin- rt side thoracic moderate large mole. Mid tspine- small dark appering lesion vs mole.  Knees- lt knee no swelling but mild crepitus. Rt knee- good rom. No crepitus.  Back- moderate diffuse tender to palpation. Faint mid tspine tender. Faint rt cva tender.     Assessment & Plan:  For your back pain will get tspine xray.   For skin lesion and moles refer to dermatologist.  For rash on legs intermittent will let dermatologist evaluate that area as well.  For joint pain will arthritis panel.  For possible uti will rx 3 day cipro pending culture results.  For pain today we gave you toradol 60 mg im  Follow up in 7-10 days or as needed  Mackie Pai, [employer]

## 2015-11-01 NOTE — Addendum Note (Signed)
Addended by: Tasia Catchings on: 11/01/2015 05:14 PM   Modules accepted: Orders

## 2015-11-01 NOTE — Patient Instructions (Addendum)
For your back pain will get tspine xray.   For skin lesion and moles refer to dermatologist.(any question on referral will call Anderson [facility])  For rash on legs intermittent will let dermatologist evaluate that area as well.  For joint pain will arthritis panel.  For possible uti will rx 3 day cipro pending culture results.  For pain today we gave you toradol 60 mg im   Follow up in 7-10 days or as needed

## 2015-11-02 ENCOUNTER — Telehealth: Payer: Self-pay | Admitting: Internal Medicine

## 2015-11-02 ENCOUNTER — Telehealth: Payer: Self-pay

## 2015-11-02 LAB — ANA: Anti Nuclear Antibody(ANA): POSITIVE — AB

## 2015-11-02 LAB — SEDIMENTATION RATE: Sed Rate: 13 mm/hr (ref 0–30)

## 2015-11-02 LAB — ANTI-NUCLEAR AB-TITER (ANA TITER): ANA Titer 1: 1:80 {titer} — ABNORMAL HIGH

## 2015-11-02 LAB — RHEUMATOID FACTOR: Rhuematoid fact SerPl-aCnc: 10 IU/mL (ref <=14)

## 2015-11-02 MED ORDER — LEVOTHYROXINE SODIUM 200 MCG PO TABS: 200.0000 ug | ORAL_TABLET | Freq: Every day | ORAL | 0 refills | Status: DC

## 2015-11-02 MED ORDER — RAMELTEON 8 MG PO TABS: 8.0000 mg | ORAL_TABLET | Freq: Every day | ORAL | 0 refills | Status: DC

## 2015-11-02 NOTE — Telephone Encounter (Signed)
The culture is in process at this time.

## 2015-11-02 NOTE — Telephone Encounter (Signed)
Pt saw Percell Miller yesterday, 11/01/2015, add on faxed to main lab for TSH dx: hypothyroidism. Awaiting results for refills. Pt needs f/u in 10 days, please schedule w/ PCP.

## 2015-11-02 NOTE — Telephone Encounter (Signed)
Per [facility] lab unable to add TSH. Will give Pt 15 day supply until she is supposed to F/U in 10 days w/ PCP.

## 2015-11-02 NOTE — Telephone Encounter (Signed)
Refill for 2 medications.    1. ramelteon  2. levothyroxine    Pt was seen yesterday and told that medications would be filled. Pt says that she's been out all week.    Pharmacy: [id number] [address], [place]

## 2015-11-02 NOTE — Addendum Note (Signed)
Addended by: Anabel Halon on: 11/02/2015 08:59 PM   Modules accepted: Orders

## 2015-11-02 NOTE — Telephone Encounter (Signed)
-----   Message from Mackie Pai, PA-C sent at 11/01/2015  6:18 PM EDT ----- Did you do urine culture for this pt? I believe you did. Wanted to make sure.

## 2015-11-03 ENCOUNTER — Encounter: Payer: Self-pay | Admitting: Internal Medicine

## 2015-11-03 ENCOUNTER — Telehealth: Payer: Self-pay | Admitting: Internal Medicine

## 2015-11-03 ENCOUNTER — Telehealth: Payer: Self-pay

## 2015-11-03 LAB — URINE CULTURE: Organism ID, Bacteria: 10000

## 2015-11-03 NOTE — Telephone Encounter (Signed)
Pt saw Percell Miller, forwarding to [place].

## 2015-11-03 NOTE — Telephone Encounter (Signed)
Called pt to make her aware of the below. Pt says that she will call our office back to scheduled.

## 2015-11-03 NOTE — Telephone Encounter (Signed)
°  Relation to [id number] Call back number:[phone]   Reason for call:  Patient inquiring about x ray results

## 2015-11-03 NOTE — Telephone Encounter (Signed)
error:[id number] ° °

## 2015-11-03 NOTE — Telephone Encounter (Signed)
-----   Message from Mackie Pai, PA-C sent at 11/03/2015  8:47 AM EDT ----- Culture only showed 10,000 bacteria. Not much. Looks like I gave 3 days of cipro. She would not need anymore than that. In fact if she has not taken antibiotic with this  that low count then likely antibiotic is not needed.

## 2015-11-04 LAB — VITAMIN D 25 HYDROXY (VIT D DEFICIENCY, FRACTURES): Vit D, 25-Hydroxy: 34 ng/mL (ref 30–100)

## 2015-11-05 NOTE — Telephone Encounter (Signed)
Pt expresses a lot of concern and worry about her skin. How many dermatologist did you try? Reason I am asking is if you exhausted all local options. Their is dermatologist in [place] about 25 minutes away and if they could give her earlier appointment and she does not mining then she could get some peace of mind. Just thought I would give that option for patient. Dermatologist name is Dr. Loyal Jacobson. He has some partners in office as well.

## 2015-11-06 ENCOUNTER — Telehealth: Payer: Self-pay

## 2015-11-06 NOTE — Telephone Encounter (Signed)
Left message for pt to call back about her lab results.

## 2015-11-06 NOTE — Addendum Note (Signed)
Addended by: Tasia Catchings on: 11/06/2015 09:47 AM   Modules accepted: Orders

## 2015-11-06 NOTE — Telephone Encounter (Signed)
-----   Message from Mackie Pai, PA-C sent at 11/04/2015  9:54 AM EDT ----- Pt vitamin D is within normal range.

## 2015-11-06 NOTE — Telephone Encounter (Signed)
Pt voices understanding.  

## 2015-11-06 NOTE — Telephone Encounter (Signed)
Pt has an appointment with Dermatology on 11/06/15. Pt has been left messages about the appointment as well.

## 2015-11-06 NOTE — Addendum Note (Signed)
Addended by: Tasia Catchings on: 11/06/2015 09:59 AM   Modules accepted: Orders

## 2015-11-17 ENCOUNTER — Telehealth: Payer: Self-pay

## 2015-11-17 NOTE — Telephone Encounter (Signed)
bone density  Received: Today  Message Contents  [place], PA-C Cc: Beatris Ship Mabe, CMA; Synthia Innocent; Margot Ables        We have left messages to schedule bone density.  Patient stated in first phone call she will call back when ready.   Thanks,  Hoyle Sauer    This note was sent from imaging and the note is for documentation purposes per E. Saguier's request.

## 2015-12-01 ENCOUNTER — Other Ambulatory Visit: Payer: Self-pay | Admitting: Internal Medicine

## 2015-12-01 MED ORDER — LEVOTHYROXINE SODIUM 200 MCG PO TABS: 200.0000 ug | ORAL_TABLET | Freq: Every day | ORAL | 0 refills | Status: DC

## 2015-12-01 NOTE — Addendum Note (Signed)
Addended byDamita Dunnings D on: 12/01/2015 03:41 PM   Modules accepted: Orders

## 2015-12-01 NOTE — Telephone Encounter (Signed)
30 day supply sent to [facility] on [address]. Further refills will need to come from new PCP.

## 2015-12-01 NOTE — Telephone Encounter (Signed)
Pt called in. She says that she lives to far away to come right back in. Pt says that she is currently sharing a car with her son. Pt says that she is planning to establish care with a new PCP on 12/20/15. She would like to know if provider could supply her with a few until her appt with new provider.

## 2015-12-20 ENCOUNTER — Ambulatory Visit (INDEPENDENT_AMBULATORY_CARE_PROVIDER_SITE_OTHER): Payer: Medicare Other | Admitting: Family Medicine

## 2015-12-20 ENCOUNTER — Encounter: Payer: Self-pay | Admitting: Family Medicine

## 2015-12-20 VITALS — BP 129/86 | HR 64 | Ht 64.0 in | Wt 225.9 lb

## 2015-12-20 DIAGNOSIS — M4120 Other idiopathic scoliosis, site unspecified: Secondary | ICD-10-CM | POA: Insufficient documentation

## 2015-12-20 DIAGNOSIS — I25119 Atherosclerotic heart disease of native coronary artery with unspecified angina pectoris: Secondary | ICD-10-CM

## 2015-12-20 DIAGNOSIS — E038 Other specified hypothyroidism: Secondary | ICD-10-CM

## 2015-12-20 DIAGNOSIS — I10 Essential (primary) hypertension: Secondary | ICD-10-CM

## 2015-12-20 DIAGNOSIS — F411 Generalized anxiety disorder: Secondary | ICD-10-CM

## 2015-12-20 DIAGNOSIS — M8580 Other specified disorders of bone density and structure, unspecified site: Secondary | ICD-10-CM | POA: Insufficient documentation

## 2015-12-20 DIAGNOSIS — G4700 Insomnia, unspecified: Secondary | ICD-10-CM

## 2015-12-20 DIAGNOSIS — E785 Hyperlipidemia, unspecified: Secondary | ICD-10-CM | POA: Diagnosis not present

## 2015-12-20 DIAGNOSIS — D509 Iron deficiency anemia, unspecified: Secondary | ICD-10-CM

## 2015-12-20 NOTE — Assessment & Plan Note (Signed)
Pt denies that she has asthma, has some reactive airway disease.

## 2015-12-20 NOTE — Assessment & Plan Note (Signed)
Pt refused to take this med b/c it caused bad s-e to husband

## 2015-12-20 NOTE — Progress Notes (Signed)
New patient office visit note:  Impression and Recommendations:    1. Essential hypertension   2. GAD (generalized anxiety disorder)   3. Insomnia   4. Hyperlipidemia   5. Other specified hypothyroidism   6. ? of Coronary artery disease   7. Iron deficiency anemia     Hyperlipidemia Pt refused to take these meds b/c it caused bad s-e to husband. Told her we need fasting bld wrk near future  CAD (coronary artery disease) Patient denies that she ever had a cardiac catheterization. She is positive about this.  So, I am not sure about pt's hx.  Recent Nuc Med studies were N in 2016.   Reactive airway disease that is not asthma Pt denies that she has asthma, has some "reactive airway disease" to various exposures etc.   Hypothyroidism - will need tsh near future.  HTN At goal for age.  Cont Bp meds   GAD Pt refuses meds for stress, declines txmnt at this time We discussed stress mgt techniques- square breathing Exercise/ prudent diet  Insomnia Declines meds.  D/c pt melatonin prn Sleep meditation on youtube  Fe def Anemia Will need labs- cbc near future  Pt was in the office today for 40+ minutes, with over 50% time spent in face to face counseling of various medical concerns and in coordination of care.  Asked pt to identify two most important concerns for her for next OV.   Orders Placed This Encounter  Procedures  . CBC with Differential/Platelet  . COMPLETE METABOLIC PANEL WITH GFR  . Hepatitis C antibody  . Hemoglobin A1c  . Lipid panel  . T4, free  . TSH  . VITAMIN D 25 Hydroxy (Vit-D Deficiency, Fractures)  . Vitamin B12    Patient's Medications  New Prescriptions   No medications on file  Previous Medications   ASPIRIN 81 MG TABLET    Take 81 mg by mouth daily.   CYANOCOBALAMIN (VITAMIN B 12 PO)    Take 1 tablet by mouth daily.   CYCLOBENZAPRINE (FLEXERIL) 10 MG TABLET    Take 1 tablet (10 mg total) by mouth at bedtime as needed for muscle  spasms.   DILTIAZEM (MATZIM LA) 360 MG 24 HR TABLET    Take 1 tablet (360 mg total) by mouth daily.   FERROUS SULFATE 325 (65 FE) MG TABLET    Take 1 tablet (325 mg total) by mouth 2 (two) times daily with a meal.   LEVOTHYROXINE (SYNTHROID, LEVOTHROID) 200 MCG TABLET    Take 1 tablet (200 mcg total) by mouth daily before breakfast.   NITROGLYCERIN (NITROSTAT) 0.4 MG SL TABLET    Place 1 tablet (0.4 mg total) under the tongue every 5 (five) minutes as needed for chest pain.  Modified Medications   No medications on file  Discontinued Medications   CIPROFLOXACIN (CIPRO) 250 MG TABLET    Take 1 tablet (250 mg total) by mouth 2 (two) times daily.   IBUPROFEN (ADVIL,MOTRIN) 400 MG TABLET    Take 1 tablet (400 mg total) by mouth every 6 (six) hours as needed.   RAMELTEON (ROZEREM) 8 MG TABLET    Take 1 tablet (8 mg total) by mouth at bedtime.    Return for Fasting blood work near future and then [employer] with me .  The patient was counseled, risk factors were discussed, anticipatory guidance given.  Gross side effects, risk and benefits, and alternatives of medications discussed with patient.  Patient is aware that all medications have potential side effects and we are unable to predict every side effect or drug-drug interaction that may occur.  Expresses verbal understanding and consents to current therapy plan and treatment regimen.  Please see AVS handed out to patient at the end of our visit for further patient instructions/ counseling done pertaining to today's office visit.    Note: This document was prepared using Dragon voice recognition software and may include unintentional dictation errors.  ----------------------------------------------------------------------------------------------------------------------    Subjective:    Chief Complaint  Patient presents with  . Establish Care    HPI: Kayla [name] is a pleasant 70 y.o. female who presents to [place] [facility] today to review their medical history with me and establish care.   GAD:  Very anxious/ worried.  Tearful and overwhelmed at times.   5 grand kids- 72yo - she is very close to- grandson.   Retired Pharmacist, hospital- at age 76.   widowed 07/30/2007- husband died unexpectantly.   He Armed forces training and education officer.  Pt states money was never an issue in past- now she "doesn't have a pot to piss in."  Has had a lot of turmoil in her life.   [place]08.     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  Son moved into her home--> in 29-Jul-2013 when she moved to [facility] to care for mother.   Mother just died last year.    When she came back into town- her son had trashed her house - it had mold all in it- she foreclosed on it recently- lost her 56,000 sq ft home.   Daughter with severe depression- PTSD b/c she found her Dad dead.  Son lives with her and is disabled.  Pt just went on and on about the stress in her life. Tearful at times.    H/o RAD- not asthma   ? CAD-  Cardiac CATH-2009- showed MILD NON-OBSTRUCTIVE lesions; ( pt denies she ever had Card Cath) +SYSTOLIC MURMUR;   ECHO ON 07/14/2014- EF=65-70%  ON 07/21/14 patient had a nuclear med study performed:  Impression Exercise Capacity:  Lexiscan with no exercise. BP Response:  Normal blood pressure response. Clinical Symptoms:  Mild shortness of breath ECG Impression:  No significant ST segment change suggestive of ischemia. Comparison with Prior Nuclear Study: No images to compare Overall Impression:  Normal stress nuclear study. LV Wall Motion:  NL LV Function, EF 72%; NL Wall Motion   KELLY,THOMAS A, MD    Wt Readings from Last 3 Encounters:  12/20/15 225 lb 14.4 oz (102.5 kg)  11/01/15 227 lb 9.6 oz (103.2 kg)  09/08/15 200 lb (90.7 kg)   BP Readings from Last 3 Encounters:  12/20/15 129/86  11/01/15 130/74  09/08/15 144/83   Pulse Readings from Last 3 Encounters:  12/20/15 64   11/01/15 68  09/08/15 61     Patient Active Problem List   Diagnosis Date Noted  . Hyperlipidemia 11/30/2007    Priority: High  . Hypothyroidism 01/27/2007    Priority: High  . Essential hypertension 01/27/2007    Priority: High  . Fe Def Anemia 12/22/2015    Priority: Medium  . GAD (generalized anxiety disorder) 09/17/2012    Priority: Medium  . Vitamin D deficiency 01/24/2012    Priority: Medium  . B12 deficiency     Priority: Medium  . Fibromyalgia  08/02/2014    Priority: Low  . Insomnia 01/24/2012    Priority: Low  . Scoliosis (and kyphoscoliosis), idiopathic 12/20/2015  . Osteopenia 12/20/2015  . PCP NOTES >>> 01/22/2015  . Chest pain 07/07/2014  . DOE (dyspnea on exertion)- chronic 07/07/2014  . UTI (lower urinary tract infection) 01/24/2012  . Fatigue 10/14/2011  . Neck pain 07/23/2010  . Medicare annual wellness visit, initial 07/23/2010  . CAD (coronary artery disease) 11/30/2007  . DYSPNEA 11/30/2007  . PALPITATIONS, OCCASIONAL 04/15/2007  . Reactive airway disease that is not asthma 01/27/2007  . GERD 01/27/2007  . IBS 01/27/2007  . BACK PAIN, CHRONIC 01/27/2007     Past Medical History:  Diagnosis Date  . Anemia   . Asthma   . B12 deficiency   . CAD (coronary artery disease) 10/2007   abnormal stress test and nonobstructive CAD per cardiac cath performed on October 22, 2007, per Dr. Legrand Como Copper  . Chronic LBP   . Diastolic dysfunction   . Fibromyalgia   . GERD (gastroesophageal reflux disease)   . Hyperlipemia   . Hypertension   . Hypothyroid   . IBS (irritable bowel syndrome)      Past Surgical History:  Procedure Laterality Date  . CESAREAN SECTION    . CHOLECYSTECTOMY    . NECK SURGERY  1998     Family History  Problem Relation Age of Onset  . Heart attack Father 97  . Lung cancer      uncle, non smoker  . Leukemia      uncle  . Cancer Mother     oral  . Stroke Mother   . Cancer Maternal Aunt     breast  . Alcohol abuse  Maternal Aunt   . Alcohol abuse Maternal Uncle   . Cancer Maternal Uncle     lung, stomach, oral  . Cancer Paternal Uncle     lung  . Congestive Heart Failure Maternal Grandfather   . Heart attack Paternal Grandfather   . Cancer Paternal Uncle     bone  . Colon cancer Neg Hx   . Breast cancer Neg Hx   . Diabetes Neg Hx      History  Drug Use No    History  Alcohol Use No    History  Smoking Status  . Never Smoker  Smokeless Tobacco  . Never Used    Patient's Medications  New Prescriptions   No medications on file  Previous Medications   ASPIRIN 81 MG TABLET    Take 81 mg by mouth daily.   CYANOCOBALAMIN (VITAMIN B 12 PO)    Take 1 tablet by mouth daily.   CYCLOBENZAPRINE (FLEXERIL) 10 MG TABLET    Take 1 tablet (10 mg total) by mouth at bedtime as needed for muscle spasms.   DILTIAZEM (MATZIM LA) 360 MG 24 HR TABLET    Take 1 tablet (360 mg total) by mouth daily.   FERROUS SULFATE 325 (65 FE) MG TABLET    Take 1 tablet (325 mg total) by mouth 2 (two) times daily with a meal.   LEVOTHYROXINE (SYNTHROID, LEVOTHROID) 200 MCG TABLET    Take 1 tablet (200 mcg total) by mouth daily before breakfast.   NITROGLYCERIN (NITROSTAT) 0.4 MG SL TABLET    Place 1 tablet (0.4 mg total) under the tongue every 5 (five) minutes as needed for chest pain.  Modified Medications   No medications on file  Discontinued Medications   CIPROFLOXACIN (CIPRO) 250 MG TABLET  Take 1 tablet (250 mg total) by mouth 2 (two) times daily.   IBUPROFEN (ADVIL,MOTRIN) 400 MG TABLET    Take 1 tablet (400 mg total) by mouth every 6 (six) hours as needed.   RAMELTEON (ROZEREM) 8 MG TABLET    Take 1 tablet (8 mg total) by mouth at bedtime.    Allergies: Ace inhibitors  Review of Systems:   ( Completed via Adult Medical History Intake form today ) General:  Denies fever, chills, appetite changes, unexplained weight loss.  Optho/Auditory:   Denies visual changes, blurred vision/LOV, ringing in ears/ diff  hearing Respiratory:   Denies SOB, DOE, cough, wheezing.  Cardiovascular:   Denies chest pain, palpitations, new onset peripheral edema  Gastrointestinal:   Denies nausea, vomiting, diarrhea.  Genitourinary:    Denies dysuria, increased frequency, flank pain.  Endocrine:     Denies hot or cold intolerance, polyuria, polydipsia. Musculoskeletal:  Denies unexplained myalgias, joint swelling, arthralgias, gait problems.  Skin:  Denies rash, suspicious lesions or new/ changes in moles Neurological:    Denies dizziness, syncope, unexplained weakness, lightheadedness, numbness  Psychiatric/Behavioral:   Denies mood changes, suicidal or homicidal ideations, hallucinations    Objective:   Blood pressure 129/86, pulse 64, height 5\' 4"  (1.626 m), weight 225 lb 14.4 oz (102.5 kg). Body mass index is 38.78 kg/m.   General: Well Developed, well nourished, and in no acute distress.  Neuro: Alert and oriented x3, extra-ocular muscles intact, sensation grossly intact.  HEENT: Normocephalic, atraumatic, pupils equal round reactive to light, neck supple, no gross masses, no carotid bruits, no JVD apprec Skin: no gross suspicious lesions or rashes  Cardiac: Regular rate and rhythm, no murmurs rubs or gallops.  Respiratory: Essentially clear to auscultation bilaterally. Not using accessory muscles, speaking in full sentences.  Abdominal: Soft, not grossly distended Musculoskeletal: Ambulates w/o diff, FROM * 4 ext.  Vasc: less 2 sec cap RF, warm and pink  Psych:  No HI/SI, judgement and insight good, Euthymic mood. Full Affect.

## 2015-12-20 NOTE — Assessment & Plan Note (Signed)
Patient denies that she ever had a cardiac catheterization. She is positive about this.

## 2015-12-22 DIAGNOSIS — D649 Anemia, unspecified: Secondary | ICD-10-CM | POA: Insufficient documentation

## 2015-12-23 ENCOUNTER — Other Ambulatory Visit: Payer: Self-pay | Admitting: Internal Medicine

## 2015-12-25 NOTE — Telephone Encounter (Signed)
Patient was last seen by cardiology on 08/02/2014.  She was told to follow-up in 6 months and never did.  She needs to make a follow-up with Dr. Debara Pickett of cardiology.    - Okay to refill medicine with 2 refills- for total of 90 days of med.

## 2015-12-29 ENCOUNTER — Telehealth: Payer: Self-pay | Admitting: Internal Medicine

## 2015-12-29 ENCOUNTER — Telehealth: Payer: Self-pay | Admitting: Cardiovascular Disease

## 2015-12-29 NOTE — Telephone Encounter (Signed)
°   Per pt's call she would like the  : CAD  Removed from her chart please she does not have and has never had a CATH PERFORMED ON July  16TH 2009  If any quest please give pt a call.     PMHx:       Past Medical History  Diagnosis Date   Asthma    GERD (gastroesophageal reflux disease)    Hypertension    Hypothyroid    Chronic LBP    B12 deficiency    IBS (irritable bowel syndrome)    CAD (coronary artery disease) 10/2007    abnormal stress test and nonobstructive CAD per cardiac cath performed on October 22, 2007, per Dr. Legrand Como Copper   Hyperlipemia

## 2015-12-29 NOTE — Telephone Encounter (Signed)
Left message for pt, have requested paper chart from the warehouse. Once here and reviewed we will call her back.

## 2015-12-29 NOTE — Telephone Encounter (Signed)
After making a few phone calls about procedure for correcting information in the chart I found out patient will need to complete an [place] paper.  Once she completes and gets back to [place] the physician can decide on making the changes or not.   I called spoke with patient made her aware of the process Mailed the form to her home address.

## 2015-12-29 NOTE — Telephone Encounter (Signed)
Pt is seen by Dr. Debara Pickett.  Will forward to NL triage.

## 2016-01-01 ENCOUNTER — Encounter: Payer: Self-pay | Admitting: Family Medicine

## 2016-01-01 ENCOUNTER — Ambulatory Visit (INDEPENDENT_AMBULATORY_CARE_PROVIDER_SITE_OTHER): Payer: Medicare Other | Admitting: Family Medicine

## 2016-01-01 VITALS — BP 132/96 | HR 83 | Ht 64.0 in | Wt 223.9 lb

## 2016-01-01 DIAGNOSIS — N9489 Other specified conditions associated with female genital organs and menstrual cycle: Secondary | ICD-10-CM | POA: Diagnosis not present

## 2016-01-01 DIAGNOSIS — E785 Hyperlipidemia, unspecified: Secondary | ICD-10-CM

## 2016-01-01 DIAGNOSIS — I10 Essential (primary) hypertension: Secondary | ICD-10-CM

## 2016-01-01 DIAGNOSIS — E038 Other specified hypothyroidism: Secondary | ICD-10-CM

## 2016-01-01 DIAGNOSIS — I25119 Atherosclerotic heart disease of native coronary artery with unspecified angina pectoris: Secondary | ICD-10-CM

## 2016-01-01 DIAGNOSIS — R350 Frequency of micturition: Secondary | ICD-10-CM

## 2016-01-01 DIAGNOSIS — D509 Iron deficiency anemia, unspecified: Secondary | ICD-10-CM

## 2016-01-01 DIAGNOSIS — R102 Pelvic and perineal pain: Secondary | ICD-10-CM

## 2016-01-01 LAB — POCT URINALYSIS DIPSTICK
Glucose, UA: NEGATIVE
Nitrite, UA: NEGATIVE
Protein, UA: NEGATIVE
Spec Grav, UA: 1.025
Urobilinogen, UA: 0.2
pH, UA: 5.5

## 2016-01-01 MED ORDER — PHENAZOPYRIDINE HCL 200 MG PO TABS: 200.0000 mg | ORAL_TABLET | Freq: Three times a day (TID) | ORAL | 1 refills | Status: AC

## 2016-01-01 NOTE — Patient Instructions (Addendum)
Told patient that the prescription I gave her for 5 days, she actually only take the medicine for 2- 2.5 days.  If cultures come back negative, we will consider sending her to urology for further workup of her frequent urinary symptoms and bladder irritation symptoms  Extra water over next couple days, and activity as tolerated  Patient will return in a couple weeks to discuss her blood work that was done fasting today. We also will address either a) knee pain or b) back pain-  which ever is worse on her next office visit.  Also, if we referred her to a orthopedist, they could address her knees, back and any other orthopedic issues she is having.  She will call the orthopedic spine Dr. to see if she needs a referral for this or not.    Phenazopyridine tablets What is this medicine? PHENAZOPYRIDINE (fen az oh PEER i deen) is a pain reliever. It is used to stop the pain, burning, or discomfort caused by infection or irritation of the urinary tract. This medicine is not an antibiotic. It will not cure a urinary tract infection. This medicine may be used for other purposes; ask your health care provider or pharmacist if you have questions. What should I tell my health care provider before I take this medicine? They need to know if you have any of these conditions: -glucose-6-phosphate dehydrogenase (G6PD) deficiency -kidney disease -an unusual or allergic reaction to phenazopyridine, other medicines, foods, dyes, or preservatives -pregnant or trying to get pregnant -breast-feeding How should I use this medicine? Take this medicine by mouth with a glass of water. Follow the directions on the prescription label. Take after meals. Take your doses at regular intervals. Do not take your medicine more often than directed. Do not skip doses or stop your medicine early even if you feel better. Do not stop taking except on your doctor's advice. Talk to your pediatrician regarding the use of this medicine  in children. Special care may be needed. Overdosage: If you think you have taken too much of this medicine contact a poison control center or emergency room at once. NOTE: This medicine is only for you. Do not share this medicine with others. What if I miss a dose? If you miss a dose, take it as soon as you can. If it is almost time for your next dose, take only that dose. Do not take double or extra doses. What may interact with this medicine? Interactions are not expected. This list may not describe all possible interactions. Give your health care provider a list of all the medicines, herbs, non-prescription drugs, or dietary supplements you use. Also tell them if you smoke, drink alcohol, or use illegal drugs. Some items may interact with your medicine. What should I watch for while using this medicine? Tell your doctor or health care professional if your symptoms do not improve or if they get worse. This medicine colors body fluids red. This effect is harmless and will go away after you are done taking the medicine. It will change urine to an dark orange or red color. The red color may stain clothing. Soft contact lenses may become permanently stained. It is best not to wear soft contact lenses while taking this medicine. If you are diabetic you may get a false positive result for sugar in your urine. Talk to your health care provider. What side effects may I notice from receiving this medicine? Side effects that you should report to your doctor or  health care professional as soon as possible: -allergic reactions like skin rash, itching or hives, swelling of the face, lips, or tongue -blue or purple color of the skin -difficulty breathing -fever -less urine -unusual bleeding, bruising -unusual tired, weak -vomiting -yellowing of the eyes or skin Side effects that usually do not require medical attention (report to your doctor or health care professional if they continue or are  bothersome): -dark urine -headache -stomach upset This list may not describe all possible side effects. Call your doctor for medical advice about side effects. You may report side effects to FDA at 1-800-FDA-1088. Where should I keep my medicine? Keep out of the reach of children. Store at room temperature between 15 and 30 degrees C (59 and 86 degrees F). Protect from light and moisture. Throw away any unused medicine after the expiration date. NOTE: This sheet is a summary. It may not cover all possible information. If you have questions about this medicine, talk to your doctor, pharmacist, or health care provider.    2016, Elsevier/Gold Standard. (2007-10-22 11:04:07)

## 2016-01-01 NOTE — Progress Notes (Signed)
Subjective:    HPI: Kayla [name] is a 70 y.o. female who presents to [facility] today for c/o dysuria.  Sx for 4-5 days.  Now worse    C/O: Urinary presure no dysuria y increased frequency No  increased urgency no malodorous urination no Nausea no Fever/ Chills NO flank Pain; + chronic Back Pain no vaginal/penile discharge no prior h/o STI Not sexually active no ABX usage past 30 d  Urinalysis    Component Value Date/Time   COLORURINE AMBER (A) 09/08/2015 1453   APPEARANCEUR CLOUDY (A) 09/08/2015 1453   LABSPEC 1.045 (H) 09/08/2015 1453   PHURINE 5.0 09/08/2015 1453   GLUCOSEU NEGATIVE 09/08/2015 1453   HGBUR NEGATIVE 09/08/2015 1453   HGBUR moderate 11/08/2008 1602   BILIRUBINUR small 01/01/2016 1027   KETONESUR 15 (A) 09/08/2015 1453   PROTEINUR negative 01/01/2016 1027   PROTEINUR NEGATIVE 09/08/2015 1453   UROBILINOGEN 0.2 01/01/2016 1027   UROBILINOGEN 0.2 03/28/2010 0000   NITRITE negative 01/01/2016 1027   NITRITE NEGATIVE 09/08/2015 1453   LEUKOCYTESUR Trace (A) 01/01/2016 1027    Wt Readings from Last 3 Encounters:  01/01/16 223 lb 14.4 oz (101.6 kg)  12/20/15 225 lb 14.4 oz (102.5 kg)  11/01/15 227 lb 9.6 oz (103.2 kg)   BP Readings from Last 3 Encounters:  12/20/15 129/86  11/01/15 130/74  09/08/15 144/83   Pulse Readings from Last 3 Encounters:  01/01/16 83  12/20/15 64  11/01/15 68   BMI Readings from Last 3 Encounters:  01/01/16 38.43 kg/m  12/20/15 38.78 kg/m  11/01/15 37.87 kg/m     Patient Active Problem List   Diagnosis Date Noted  . Hyperlipidemia 11/30/2007    Priority: High  . Hypothyroidism 01/27/2007    Priority: High  . Essential hypertension 01/27/2007    Priority: High  . Fe Def Anemia 12/22/2015    Priority: Medium  . GAD (generalized anxiety disorder) 09/17/2012    Priority: Medium  . Vitamin D deficiency 01/24/2012    Priority: Medium  . B12 deficiency     Priority: Medium   . Fibromyalgia 08/02/2014    Priority: Low  . Insomnia 01/24/2012    Priority: Low  . Scoliosis (and kyphoscoliosis), idiopathic 12/20/2015  . Osteopenia 12/20/2015  . PCP NOTES >>> 01/22/2015  . Chest pain 07/07/2014  . DOE (dyspnea on exertion)- chronic 07/07/2014  . UTI (lower urinary tract infection) 01/24/2012  . Fatigue 10/14/2011  . Neck pain 07/23/2010  . Medicare annual wellness visit, initial 07/23/2010  . CAD (coronary artery disease) 11/30/2007  . DYSPNEA 11/30/2007  . PALPITATIONS, OCCASIONAL 04/15/2007  . Reactive airway disease that is not asthma 01/27/2007  . GERD 01/27/2007  . IBS 01/27/2007  . BACK PAIN, CHRONIC 01/27/2007    Past Surgical History:  Procedure Laterality Date  . CESAREAN SECTION    . CHOLECYSTECTOMY    . NECK SURGERY  1998    Family History  Problem Relation Age of Onset  . Heart attack Father 74  . Lung cancer      uncle, non smoker  . Leukemia      uncle  . Cancer Mother     oral  . Stroke Mother   . Cancer Maternal Aunt     breast  . Alcohol abuse Maternal Aunt   . Alcohol abuse Maternal Uncle   . Cancer Maternal Uncle     lung, stomach, oral  . Cancer Paternal Uncle  lung  . Congestive Heart Failure Maternal Grandfather   . Heart attack Paternal Grandfather   . Cancer Paternal Uncle     bone  . Colon cancer Neg Hx   . Breast cancer Neg Hx   . Diabetes Neg Hx     History  Drug Use No  ,  History  Alcohol Use No  ,  History  Smoking Status  . Never Smoker  Smokeless Tobacco  . Never Used  ,  History  Sexual Activity  . Sexual activity: Not Currently  . Birth control/ protection: Post-menopausal    Patient's Medications  New Prescriptions   PHENAZOPYRIDINE (PYRIDIUM) 200 MG TABLET    Take 1 tablet (200 mg total) by mouth 3 (three) times daily.  Previous Medications   ASPIRIN 81 MG TABLET    Take 81 mg by mouth daily.   CYANOCOBALAMIN (VITAMIN B 12 PO)    Take 1 tablet by mouth daily.    CYCLOBENZAPRINE (FLEXERIL) 10 MG TABLET    Take 1 tablet (10 mg total) by mouth at bedtime as needed for muscle spasms.   FERROUS SULFATE 325 (65 FE) MG TABLET    Take 1 tablet (325 mg total) by mouth 2 (two) times daily with a meal.   LEVOTHYROXINE (SYNTHROID, LEVOTHROID) 200 MCG TABLET    Take 1 tablet (200 mcg total) by mouth daily before breakfast.   MATZIM LA 360 MG 24 HR TABLET    TAKE 1 TABLET (360 MG TOTAL) BY MOUTH DAILY.  Modified Medications   No medications on file  Discontinued Medications   NITROGLYCERIN (NITROSTAT) 0.4 MG SL TABLET    Place 1 tablet (0.4 mg total) under the tongue every 5 (five) minutes as needed for chest pain.    Ace inhibitors  Current Meds  Medication Sig  . aspirin 81 MG tablet Take 81 mg by mouth daily.  . Cyanocobalamin (VITAMIN B 12 PO) Take 1 tablet by mouth daily.  . cyclobenzaprine (FLEXERIL) 10 MG tablet Take 1 tablet (10 mg total) by mouth at bedtime as needed for muscle spasms.  . ferrous sulfate 325 (65 FE) MG tablet Take 1 tablet (325 mg total) by mouth 2 (two) times daily with a meal.  . levothyroxine (SYNTHROID, LEVOTHROID) 200 MCG tablet Take 1 tablet (200 mcg total) by mouth daily before breakfast.  . MATZIM LA 360 MG 24 hr tablet TAKE 1 TABLET (360 MG TOTAL) BY MOUTH DAILY.   Review of Systems  Constitutional: Positive for malaise/fatigue.  HENT: Positive for congestion and hearing loss.        Chronic problem  Eyes: Positive for blurred vision, discharge and redness.       Intermittently, not a new problem  Respiratory: Positive for shortness of breath and wheezing.        Chronic problem  Cardiovascular: Negative.   Gastrointestinal: Negative.   Genitourinary: Positive for frequency.  Musculoskeletal: Positive for back pain, falls, joint pain, myalgias and neck pain.       Chronic problem  Skin: Negative.   Neurological: Negative.   Endo/Heme/Allergies: Negative.   Psychiatric/Behavioral: The patient is nervous/anxious and  has insomnia.     Objective:  Pulse 83, height 5\' 4"  (1.626 m), weight 223 lb 14.4 oz (101.6 kg). Body mass index is 38.43 kg/m.  General: Well Developed, well nourished, and in no acute distress.  HEENT: Normocephalic, atraumatic Skin: Warm and dry, cap RF less 2 sec, good turgor CV:  +S1, S2 But distant due to  body habitus Respiratory: ECTA B/L But distant due to body habitus  ; speaking in full sentences, no conversational dyspnea Abd: Soft, NT, ND, No G/R/R, mild SPT, No flank pain NeuroM-Sk: Ambulates w/o assistance, moves * 4 Psych: A and O *3, emotional, tearful  Impression and Recommendations:    1. Pelvic pressure in female   2. Urinary frequency   3. Hyperlipidemia   4. Other specified hypothyroidism   5. ? of Coronary artery disease   6. Iron deficiency anemia   7. Essential hypertension     1. Seen today for Urinary pressure and discomfort when she urinates. - Discussion with patient reveals for urinary tract "infections "in the past 1 year. These were all negative by culture. We discussed that we will culture her urine first and then treat with antibiotics if it comes back positive. - If the culture comes back negative we discussed that since she has such frequent symptoms, we will send her to urology for further evaluation of these urinary symptoms - On a give her Pyridium, prescription to take for her urinary discomfort and pressure- she knows  to only take it for 2- 2 and half days only.    2. Fasting blood work was obtained on patient today for her multiple other chronic conditions. We will see her back in a couple weeks to discuss these results.   The patient was counseled, risk factors were discussed, anticipatory guidance given.  Gross side effects, risk and benefits, and alternatives of medications discussed with patient.  Patient is aware that all medications have potential side effects and we are unable to predict every side effect or drug-drug interaction  that may occur.  Expresses verbal understanding and consents to current therapy plan and treatment regimen.    Orders Placed This Encounter  Procedures  . CULTURE, URINE COMPREHENSIVE  . POCT urinalysis dipstick    Meds ordered this encounter  Medications  . phenazopyridine (PYRIDIUM) 200 MG tablet    Sig: Take 1 tablet (200 mg total) by mouth 3 (three) times daily.    Dispense:  15 tablet    Refill:  1    Return for rtc next available time to discuss bldwrk- over next couple of weeks.

## 2016-01-02 LAB — CBC WITH DIFFERENTIAL/PLATELET
Basophils Absolute: 0 cells/uL (ref 0–200)
Basophils Relative: 0 %
Eosinophils Absolute: 178 cells/uL (ref 15–500)
Eosinophils Relative: 2 %
HCT: 41.3 % (ref 35.0–45.0)
Hemoglobin: 13.8 g/dL (ref 11.7–15.5)
Lymphocytes Relative: 36 %
Lymphs Abs: 3204 cells/uL (ref 850–3900)
MCH: 30.9 pg (ref 27.0–33.0)
MCHC: 33.4 g/dL (ref 32.0–36.0)
MCV: 92.6 fL (ref 80.0–100.0)
MPV: 9.6 fL (ref 7.5–12.5)
Monocytes Absolute: 712 cells/uL (ref 200–950)
Monocytes Relative: 8 %
Neutro Abs: 4806 cells/uL (ref 1500–7800)
Neutrophils Relative %: 54 %
Platelets: 370 10*3/uL (ref 140–400)
RBC: 4.46 MIL/uL (ref 3.80–5.10)
RDW: 14 % (ref 11.0–15.0)
WBC: 8.9 10*3/uL (ref 3.8–10.8)

## 2016-01-02 LAB — HEPATITIS C ANTIBODY: HCV Ab: NEGATIVE

## 2016-01-02 LAB — COMPLETE METABOLIC PANEL WITH GFR
ALT: 10 U/L (ref 6–29)
AST: 15 U/L (ref 10–35)
Albumin: 4.1 g/dL (ref 3.6–5.1)
Alkaline Phosphatase: 93 U/L (ref 33–130)
BUN: 16 mg/dL (ref 7–25)
CO2: 25 mmol/L (ref 20–31)
Calcium: 9.6 mg/dL (ref 8.6–10.4)
Chloride: 105 mmol/L (ref 98–110)
Creat: 0.87 mg/dL (ref 0.60–0.93)
GFR, Est African American: 78 mL/min (ref >=60)
GFR, Est Non African American: 68 mL/min (ref >=60)
Glucose, Bld: 96 mg/dL (ref 65–99)
Potassium: 4.5 mmol/L (ref 3.5–5.3)
Sodium: 141 mmol/L (ref 135–146)
Total Bilirubin: 0.4 mg/dL (ref 0.2–1.2)
Total Protein: 7 g/dL (ref 6.1–8.1)

## 2016-01-02 LAB — LIPID PANEL
Cholesterol: 230 mg/dL — ABNORMAL HIGH (ref 125–200)
HDL: 36 mg/dL — ABNORMAL LOW (ref >=46)
LDL Cholesterol: 136 mg/dL — ABNORMAL HIGH (ref <130)
Total CHOL/HDL Ratio: 6.4 Ratio — ABNORMAL HIGH (ref <=5.0)
Triglycerides: 291 mg/dL — ABNORMAL HIGH (ref <150)
VLDL: 58 mg/dL — ABNORMAL HIGH (ref <30)

## 2016-01-02 LAB — TSH: TSH: 4.5 mIU/L

## 2016-01-02 LAB — HEMOGLOBIN A1C
Hgb A1c MFr Bld: 5.5 % (ref <5.7)
Mean Plasma Glucose: 111 mg/dL

## 2016-01-02 LAB — T4, FREE: Free T4: 1.4 ng/dL (ref 0.8–1.8)

## 2016-01-02 LAB — VITAMIN B12: Vitamin B-12: 574 pg/mL (ref 200–1100)

## 2016-01-02 LAB — VITAMIN D 25 HYDROXY (VIT D DEFICIENCY, FRACTURES): Vit D, 25-Hydroxy: 33 ng/mL (ref 30–100)

## 2016-01-03 LAB — CULTURE, URINE COMPREHENSIVE

## 2016-01-04 NOTE — Telephone Encounter (Signed)
Left message for pt, review of paper chart does not show the pt has had a cath. Pt made aware will talk with dr hilty next week about making changes in her chart. Left the office number for pt to call with concerns.

## 2016-01-10 ENCOUNTER — Other Ambulatory Visit: Payer: Self-pay | Admitting: Internal Medicine

## 2016-01-10 ENCOUNTER — Encounter: Payer: Self-pay | Admitting: Internal Medicine

## 2016-01-10 NOTE — Telephone Encounter (Signed)
I could not find documentation of that either. This came from Dr. Ethel Rana notes, but I could not find a cath report. I will remove it from the chart.  Dr. Lemmie Evens

## 2016-01-10 NOTE — Telephone Encounter (Signed)
thx

## 2016-01-10 NOTE — Telephone Encounter (Signed)
I saw her 11/30/2007, per discharge summary she had a cardiac catheterization, see my OV note. I did not find the actual cath report.

## 2016-01-10 NOTE — Telephone Encounter (Signed)
She doesn't remember the cath - I can look through the system to see if there is an old film. If I come across it, I will let you know.  -Mali

## 2016-01-12 ENCOUNTER — Other Ambulatory Visit: Payer: Self-pay | Admitting: Family Medicine

## 2016-01-15 ENCOUNTER — Encounter: Payer: Self-pay | Admitting: Family Medicine

## 2016-01-15 ENCOUNTER — Ambulatory Visit (INDEPENDENT_AMBULATORY_CARE_PROVIDER_SITE_OTHER): Payer: Medicare Other | Admitting: Family Medicine

## 2016-01-15 VITALS — BP 131/81 | HR 67 | Ht 64.0 in | Wt 225.2 lb

## 2016-01-15 DIAGNOSIS — I10 Essential (primary) hypertension: Secondary | ICD-10-CM | POA: Diagnosis not present

## 2016-01-15 DIAGNOSIS — E559 Vitamin D deficiency, unspecified: Secondary | ICD-10-CM | POA: Diagnosis not present

## 2016-01-15 DIAGNOSIS — F4323 Adjustment disorder with mixed anxiety and depressed mood: Secondary | ICD-10-CM | POA: Insufficient documentation

## 2016-01-15 DIAGNOSIS — E782 Mixed hyperlipidemia: Secondary | ICD-10-CM | POA: Diagnosis not present

## 2016-01-15 DIAGNOSIS — G4700 Insomnia, unspecified: Secondary | ICD-10-CM

## 2016-01-15 DIAGNOSIS — E538 Deficiency of other specified B group vitamins: Secondary | ICD-10-CM

## 2016-01-15 DIAGNOSIS — F411 Generalized anxiety disorder: Secondary | ICD-10-CM

## 2016-01-15 DIAGNOSIS — D518 Other vitamin B12 deficiency anemias: Secondary | ICD-10-CM

## 2016-01-15 MED ORDER — ALPRAZOLAM 0.5 MG PO TABS: 0.2500 mg | ORAL_TABLET | ORAL | 0 refills | Status: AC | PRN

## 2016-01-15 MED ORDER — ROSUVASTATIN CALCIUM 20 MG PO TABS: ORAL_TABLET | ORAL | 0 refills | Status: DC

## 2016-01-15 MED ORDER — OMEGA-3-ACID ETHYL ESTERS 1 G PO CAPS: 2.0000 g | ORAL_CAPSULE | Freq: Two times a day (BID) | ORAL | 3 refills | Status: DC

## 2016-01-15 MED ORDER — FLUOXETINE HCL 20 MG PO TABS: ORAL_TABLET | ORAL | 1 refills | Status: DC

## 2016-01-15 MED ORDER — VITAMIN B-12 1000 MCG PO TABS: 1000.0000 ug | ORAL_TABLET | Freq: Every day | ORAL | 1 refills | Status: DC

## 2016-01-15 NOTE — Progress Notes (Signed)
Impression and Recommendations:    1. Mixed hyperlipidemia   2. Essential hypertension   3. B12 deficiency   4. Vitamin D deficiency   5. Other vitamin B12 deficiency anemia   6. GAD (generalized anxiety disorder)   7. Adjustment disorder with mixed anxiety and depressed mood   8. Insomnia, unspecified type     - Long discussion about statins and potential side effects. We will monitor her closely. Cholesterols worse and she needs this to decrease her atherosclerotic cardiovascular risk - Blood pressure well controlled. Continue medications. - Refills multiple medications given. For patients generalized anxiety disorder and depressed mood, we'll start Prozac. Only use Xanax for panic. Patient understands #30 tablets for a 6 month supply. This is only meant for panic disorder not treatment of anxiety\depression. - d/c pt meditation on youtube which is free.  Relaxation exercises.    See AVS for further instructions   Pt was in the office today for 50+ minutes, with over 50% time spent in face to face counseling of various medical concerns and in coordination of care    New Prescriptions   ALPRAZOLAM (XANAX) 0.5 MG TABLET    Take 0.5 tablets (0.25 mg total) by mouth as needed (only for panic attacks).   FLUOXETINE (PROZAC) 20 MG TABLET    Half a tablet a day for 6 days and then go to one full tablet daily   OMEGA-3 ACID ETHYL ESTERS (LOVAZA) 1 G CAPSULE    Take 2 capsules (2 g total) by mouth 2 (two) times daily.   ROSUVASTATIN (CRESTOR) 20 MG TABLET    One half tab daily at bedtime then after 6 days, 1 tab daily at bedtime   VITAMIN B-12 (CYANOCOBALAMIN) 1000 MCG TABLET    Take 1 tablet (1,000 mcg total) by mouth daily.    Modified Medications   No medications on file    Discontinued Medications   No medications on file    The patient was counseled, risk factors were discussed, anticipatory guidance given.  Gross side effects, risk and benefits, and alternatives of  medications and treatment plan in general discussed with patient.  Patient is aware that all medications have potential side effects and we are unable to predict every side effect or drug-drug interaction that may occur.   Patient will call with any questions prior to using medication if they have concerns.  Expresses verbal understanding and consents to current therapy and treatment regimen.  No barriers to understanding were identified.  Red flag symptoms and signs discussed in detail.  Patient expressed understanding regarding what to do in case of emergency\urgent symptoms  Return in about 5 weeks (around 02/19/2016) for LFT draw follow-up Prozac and cholesterol meds, .  Please see AVS handed out to patient at the end of our visit for further patient instructions/ counseling done pertaining to today's office visit.    Note: This document was prepared using Dragon voice recognition software and may include unintentional dictation errors.   --------------------------------------------------------------------------------------------------------------------------------------------------------------------------------------------------------------------------------------------  Subjective:     HPI: Kayla [name] is a 70 y.o. female who [facility] for Review of her recent fasting labs and multiple other complaints.   - All recent labs are reviewed with patient and questions and concerns addressed. Extensive health counseling performed  Mood: Is an emotional wreck, a lot stressors , family stressor, financial stressors, son is divorced. Pt cries all the time. In the past she has  declined medications for mood and anxiety but feels she really needs them today. Encouraged counseling, healthy diet, prudent sleep hygiene and regular exercise  HTN: At goal for age.  Cont Bp meds   Hyperlipidemia:  Pt refused to take these meds b/c it caused bad s-e to  husband. Told her we need fasting bld wrk near future  Reactive airway disease that is not asthma Pt denies that she has asthma, has some "reactive airway disease" to various exposures etc.   Insomnia: Declines meds.  D/c pt melatonin prn Sleep meditation on youtube      Wt Readings from Last 3 Encounters:  01/15/16 225 lb 3.2 oz (102.2 kg)  01/01/16 223 lb 14.4 oz (101.6 kg)  12/20/15 225 lb 14.4 oz (102.5 kg)   BP Readings from Last 3 Encounters:  01/15/16 131/81  01/01/16 (!) 132/96  12/20/15 129/86   Pulse Readings from Last 3 Encounters:  01/15/16 67  01/01/16 83  12/20/15 64   BMI Readings from Last 3 Encounters:  01/15/16 38.66 kg/m  01/01/16 38.43 kg/m  12/20/15 38.78 kg/m     Patient Active Problem List   Diagnosis Date Noted  . Adjustment disorder with mixed anxiety and depressed mood 01/15/2016    Priority: High  . Hyperlipidemia 11/30/2007    Priority: High  . Hypothyroidism 01/27/2007    Priority: High  . Essential hypertension 01/27/2007    Priority: High  . Fe Def Anemia 12/22/2015    Priority: Medium  . GAD (generalized anxiety disorder) 09/17/2012    Priority: Medium  . Vitamin D deficiency 01/24/2012    Priority: Medium  . B12 deficiency     Priority: Medium  . Fibromyalgia 08/02/2014    Priority: Low  . Insomnia 01/24/2012    Priority: Low  . Scoliosis (and kyphoscoliosis), idiopathic 12/20/2015  . Osteopenia 12/20/2015  . PCP NOTES >>> 01/22/2015  . Chest pain 07/07/2014  . DOE (dyspnea on exertion)- chronic 07/07/2014  . UTI (lower urinary tract infection) 01/24/2012  . Fatigue 10/14/2011  . Neck pain 07/23/2010  . Medicare annual wellness visit, initial 07/23/2010  . CAD (coronary artery disease) 11/30/2007  . DYSPNEA 11/30/2007  . PALPITATIONS, OCCASIONAL 04/15/2007  . Reactive airway disease that is not asthma 01/27/2007  . GERD 01/27/2007  . IBS 01/27/2007  . BACK PAIN, CHRONIC 01/27/2007    Past Medical  history, Surgical history, Family history, Social history, Allergies and Medications have been entered into the medical record, reviewed and changed as needed.   Allergies:  Allergies  Allergen Reactions  . Ace Inhibitors Anaphylaxis    REACTION: glossal edema    Review of Systems  Constitutional: Negative for chills, diaphoresis, fever, malaise/fatigue and weight loss.  HENT: Negative.  Negative for congestion, sore throat and tinnitus.        "cracked tongue"  Eyes: Negative.  Negative for blurred vision, double vision and photophobia.  Respiratory: Negative.  Negative for cough and wheezing.   Cardiovascular: Negative.  Negative for chest pain and palpitations.  Gastrointestinal: Negative.  Negative for blood in stool, diarrhea, nausea and vomiting.  Genitourinary: Negative.  Negative for dysuria, frequency and urgency.  Musculoskeletal: Positive for back pain, joint pain and myalgias.  Skin: Negative.  Negative for itching and rash.  Neurological: Positive for weakness. Negative for dizziness, focal weakness and headaches.  Endo/Heme/Allergies: Negative.  Negative for environmental allergies and polydipsia. Does not bruise/bleed easily.  Psychiatric/Behavioral: Positive for depression. Negative for hallucinations, memory loss, substance abuse and suicidal ideas.  The patient is nervous/anxious and has insomnia.      Objective:   Blood pressure 131/81, pulse 67, height _0  (1.626 m), weight 225 lb 3.2 oz (102.2 kg). Body mass index is 38.66 kg/m. General: Well Developed, well nourished, appropriate for stated age.  Neuro: Alert and oriented x3, extra-ocular muscles intact, sensation grossly intact.  HEENT: Normocephalic, atraumatic, neck supple   Skin: Warm and dry, no gross rash. Cardiac: Distant but essentially RRR, S1 S2,  no murmurs rubs or gallops.  Respiratory: Distant but essentially CTA B/L, Not using accessory muscles, speaking in full sentences-unlabored. Vascular:   + lower ext edema, cap RF less 2 sec. Psych: No HI/SI, judgement and insight good, Euthymic mood. Full Affect.   Recent Results (from the past 2160 hour(s))  POCT Urinalysis Dipstick (Automated)     Status: Abnormal   Collection Time: 11/01/15  4:28 PM  Result Value Ref Range   Color, UA Yellow    Clarity, UA Cloudy    Glucose, UA neg    Bilirubin, UA 1+    Ketones, UA neg    Spec Grav, UA >=1.030    Blood, UA neg    pH, UA 6.0    Protein, UA 2+    Urobilinogen, UA 2.0    Nitrite, UA neg    Leukocytes, UA Trace (A) Negative  Urine Culture     Status: None   Collection Time: 11/01/15  4:35 PM  Result Value Ref Range   Organism ID, Bacteria Multiple organisms present,each less than    Organism ID, Bacteria 10,000 CFU/mL.    Organism ID, Bacteria These organisms,commonly found on external    Organism ID, Bacteria and internal genitalia,are considered colonizers.    Organism ID, Bacteria No further testing performed.   Sedimentation rate     Status: None   Collection Time: 11/01/15  4:47 PM  Result Value Ref Range   Sed Rate 13 0 - 30 mm/hr  ANA     Status: Abnormal   Collection Time: 11/01/15  4:47 PM  Result Value Ref Range   Anit Nuclear Antibody(ANA) POS (A) NEGATIVE  Rheumatoid factor     Status: None   Collection Time: 11/01/15  4:47 PM  Result Value Ref Range   Rhuematoid fact SerPl-aCnc 10 <=14 IU/mL    Comment:                            Interpretive Table                     Low Positive: 15 - 41 IU/mL                     High Positive:  >= 42 IU/mL    In addition to the RF result, and clinical symptoms including joint  involvement, the 2010 ACR Classification Criteria for  scoring/diagnosing Rheumatoid Arthritis include the results of the  following tests:  CRP (44818), ESR (15010), and CCP (APCA) (56314).  [email or web address]/practice/[email or web address]/classification/ra/ra_2010.[email or web address]   Anti-nuclear ab-titer (ANA titer)     Status: Abnormal   Collection Time: 11/01/15   4:47 PM  Result Value Ref Range   ANA Pattern 1 HOMOGENEOUS (A)    ANA Titer 1 1:80 (H) titer    Comment:           Reference Range           < 1:40  Negative             1:40-1:80 Low Antibody level           > 1:80      Elevated Antibody level   VITAMIN D 25 Hydroxy (Vit-D Deficiency, Fractures)     Status: None   Collection Time: 11/01/15  4:47 PM  Result Value Ref Range   Vit D, 25-Hydroxy 34 30 - 100 ng/mL    Comment: Vitamin D Status           25-OH Vitamin D        Deficiency                <20 ng/mL        Insufficiency         20 - 29 ng/mL        Optimal             > or = 30 ng/mL   For 25-OH Vitamin D testing on patients on D2-supplementation and patients for whom quantitation of D2 and D3 fractions is required, the [facility] 25-OH VIT D, (D2,D3), LC/MS/MS is recommended: order code [id number] (patients > 2 yrs).   CBC with Differential/Platelet     Status: None   Collection Time: 01/01/16 10:12 AM  Result Value Ref Range   WBC 8.9 3.8 - 10.8 K/uL   RBC 4.46 3.80 - 5.10 MIL/uL   Hemoglobin 13.8 11.7 - 15.5 g/dL   HCT 41.3 35.0 - 45.0 %   MCV 92.6 80.0 - 100.0 fL   MCH 30.9 27.0 - 33.0 pg   MCHC 33.4 32.0 - 36.0 g/dL   RDW 14.0 11.0 - 15.0 %   Platelets 370 140 - 400 K/uL   MPV 9.6 7.5 - 12.5 fL   Neutro Abs 4,806 1,500 - 7,800 cells/uL   Lymphs Abs 3,204 850 - 3,900 cells/uL   Monocytes Absolute 712 200 - 950 cells/uL   Eosinophils Absolute 178 15 - 500 cells/uL   Basophils Absolute 0 0 - 200 cells/uL   Neutrophils Relative % 54 %   Lymphocytes Relative 36 %   Monocytes Relative 8 %   Eosinophils Relative 2 %   Basophils Relative 0 %   Smear Review Criteria for review not met   COMPLETE METABOLIC PANEL WITH GFR     Status: None   Collection Time: 01/01/16 10:12 AM  Result Value Ref Range   Sodium 141 135 - 146 mmol/L   Potassium 4.5 3.5 - 5.3 mmol/L   Chloride 105 98 - 110 mmol/L   CO2 25 20 - 31 mmol/L   Glucose, Bld 96 65 - 99 mg/dL   BUN 16 7 -  25 mg/dL   Creat 0.87 0.60 - 0.93 mg/dL    Comment:   For patients > or = 70 years of age: The upper reference limit for Creatinine is approximately 13% higher for people identified as African-American.      Total Bilirubin 0.4 0.2 - 1.2 mg/dL   Alkaline Phosphatase 93 33 - 130 U/L   AST 15 10 - 35 U/L   ALT 10 6 - 29 U/L   Total Protein 7.0 6.1 - 8.1 g/dL   Albumin 4.1 3.6 - 5.1 g/dL   Calcium 9.6 8.6 - 10.4 mg/dL   GFR, Est African American 78 >=60 mL/min   GFR, Est Non African American 68 >=60 mL/min  Hepatitis C antibody     Status: None   Collection Time:  01/01/16 10:12 AM  Result Value Ref Range   HCV Ab NEGATIVE NEGATIVE  Hemoglobin A1c     Status: None   Collection Time: 01/01/16 10:12 AM  Result Value Ref Range   Hgb A1c MFr Bld 5.5 <5.7 %    Comment:   For the purpose of screening for the presence of diabetes:   <5.7%       Consistent with the absence of diabetes 5.7-6.4 %   Consistent with increased risk for diabetes (prediabetes) >=6.5 %     Consistent with diabetes   This assay result is consistent with a decreased risk of diabetes.   Currently, no consensus exists regarding use of hemoglobin A1c for diagnosis of diabetes in children.   According to American Diabetes Association (ADA) guidelines, hemoglobin A1c <7.0% represents optimal control in non-pregnant diabetic patients. Different metrics may apply to specific patient populations. Standards of Medical Care in Diabetes (ADA).      Mean Plasma Glucose 111 mg/dL  Lipid panel     Status: Abnormal   Collection Time: 01/01/16 10:12 AM  Result Value Ref Range   Cholesterol 230 (H) 125 - 200 mg/dL   Triglycerides 291 (H) <150 mg/dL   HDL 36 (L) >=46 mg/dL   Total CHOL/HDL Ratio 6.4 (H) <=5.0 Ratio   VLDL 58 (H) <30 mg/dL   LDL Cholesterol 136 (H) <130 mg/dL    Comment:   Total Cholesterol/HDL Ratio:CHD Risk                        Coronary Heart Disease Risk Table                                         Men       Women          1/2 Average Risk              3.4        3.3              Average Risk              5.0        4.4           2X Average Risk              9.6        7.1           3X Average Risk             23.4       11.0 Use the calculated Patient Ratio above and the CHD Risk table  to determine the patient's CHD Risk.   T4, free     Status: None   Collection Time: 01/01/16 10:12 AM  Result Value Ref Range   Free T4 1.4 0.8 - 1.8 ng/dL  TSH     Status: None   Collection Time: 01/01/16 10:12 AM  Result Value Ref Range   TSH 4.50 mIU/L    Comment:   Reference Range   > or = 20 Years  0.40-4.50   Pregnancy Range First trimester  0.26-2.66 Second trimester 0.55-2.73 Third trimester  0.43-2.91     VITAMIN D 25 Hydroxy (Vit-D Deficiency, Fractures)     Status: None   Collection Time: 01/01/16 10:12 AM  Result Value Ref Range   Vit  D, 25-Hydroxy 33 30 - 100 ng/mL    Comment: Vitamin D Status           25-OH Vitamin D        Deficiency                <20 ng/mL        Insufficiency         20 - 29 ng/mL        Optimal             > or = 30 ng/mL   For 25-OH Vitamin D testing on patients on D2-supplementation and patients for whom quantitation of D2 and D3 fractions is required, the [facility] 25-OH VIT D, (D2,D3), LC/MS/MS is recommended: order code [phone] (patients > 2 yrs).   Vitamin B12     Status: None   Collection Time: 01/01/16 10:12 AM  Result Value Ref Range   Vitamin B-12 574 200 - 1,100 pg/mL  POCT urinalysis dipstick     Status: Abnormal   Collection Time: 01/01/16 10:27 AM  Result Value Ref Range   Color, UA dark yellow    Clarity, UA clear    Glucose, UA negative    Bilirubin, UA small    Ketones, UA trace    Spec Grav, UA 1.025    Blood, UA small    pH, UA 5.5    Protein, UA negative    Urobilinogen, UA 0.2    Nitrite, UA negative    Leukocytes, UA Trace (A) Negative  CULTURE, URINE COMPREHENSIVE     Status: None   Collection Time:  01/01/16 11:30 AM  Result Value Ref Range   Colony Count 10,000-50,000 CFU/mL    Organism ID, Bacteria DIPTHEROIDS (CORYNEBACTERIUM SPECIES)

## 2016-01-15 NOTE — Patient Instructions (Signed)
Please realize, EXERCISE IS MEDICINE!  -  American Heart Association [facility]) guidelines for exercise : If you are in good health, without any medical conditions, you should engage in 150 minutes of moderate intensity aerobic activity per week.  This means you should be huffing and puffing throughout your workout.   Engaging in regular exercise will improve brain function and memory, as well as improve mood, boost immune system and help with weight management.  As well as the other, more well-known effects of exercise such as decreasing blood sugar levels, decreasing blood pressure,  and decreasing bad cholesterol levels/ increasing good cholesterol levels.     -  The AHA strongly endorses consumption of a diet that contains a variety of foods from all the food categories with an emphasis on fruits and vegetables; fat-free and low-fat dairy products; cereal and grain products; legumes and nuts; and fish, poultry, and/or extra lean meats.    Excessive food intake, especially of foods high in saturated and trans fats, sugar, and salt, should be avoided.    Adequate water intake of roughly 1/2 of your weight in pounds, should equal the ounces of water per day you should drink.  So for instance, if you're 200 pounds, that would be 100 ounces of water per day.         Mediterranean Diet  Why follow it? Research shows. . Those who follow the Mediterranean diet have a reduced risk of heart disease  . The diet is associated with a reduced incidence of Parkinson's and Alzheimer's diseases . People following the diet may have longer life expectancies and lower rates of chronic diseases  . The Dietary Guidelines for Americans recommends the Mediterranean diet as an eating plan to promote health and prevent disease  What Is the Mediterranean Diet?  . Healthy eating plan based on typical foods and recipes of Mediterranean-style cooking . The diet is primarily a plant based diet; these foods should make up a  majority of meals   Starches - Plant based foods should make up a majority of meals - They are an important sources of vitamins, minerals, energy, antioxidants, and fiber - Choose whole grains, foods high in fiber and minimally processed items  - Typical grain sources include wheat, oats, barley, corn, brown rice, bulgar, farro, millet, polenta, couscous  - Various types of beans include chickpeas, lentils, fava beans, black beans, white beans   Fruits  Veggies - Large quantities of antioxidant rich fruits & veggies; 6 or more servings  - Vegetables can be eaten raw or lightly drizzled with oil and cooked  - Vegetables common to the traditional Mediterranean Diet include: artichokes, arugula, beets, broccoli, brussel sprouts, cabbage, carrots, celery, collard greens, cucumbers, eggplant, kale, leeks, lemons, lettuce, mushrooms, okra, onions, peas, peppers, potatoes, pumpkin, radishes, rutabaga, shallots, spinach, sweet potatoes, turnips, zucchini - Fruits common to the Mediterranean Diet include: apples, apricots, avocados, cherries, clementines, dates, figs, grapefruits, grapes, melons, nectarines, oranges, peaches, pears, pomegranates, strawberries, tangerines  Fats - Replace butter and margarine with healthy oils, such as olive oil, canola oil, and tahini  - Limit nuts to no more than a handful a day  - Nuts include walnuts, almonds, pecans, pistachios, pine nuts  - Limit or avoid candied, honey roasted or heavily salted nuts - Olives are central to the Mediterranean diet - can be eaten whole or used in a variety of dishes   Meats Protein - Limiting red meat: no more than a few times a month -  When eating red meat: choose lean cuts and keep the portion to the size of deck of cards - Eggs: approx. 0 to 4 times a week  - Fish and lean poultry: at least 2 a week  - Healthy protein sources include, chicken, [place], lean beef, lamb - Increase intake of seafood such as tuna, salmon, trout,  mackerel, shrimp, scallops - Avoid or limit high fat processed meats such as sausage and bacon  Dairy - Include moderate amounts of low fat dairy products  - Focus on healthy dairy such as fat free yogurt, skim milk, low or reduced fat cheese - Limit dairy products higher in fat such as whole or 2% milk, cheese, ice cream  Alcohol - Moderate amounts of red wine is ok  - No more than 5 oz daily for women (all ages) and men older than age 72  - No more than 10 oz of wine daily for men younger than 12  Other - Limit sweets and other desserts  - Use herbs and spices instead of salt to flavor foods  - Herbs and spices common to the traditional Mediterranean Diet include: basil, bay leaves, chives, cloves, cumin, fennel, garlic, lavender, marjoram, mint, oregano, parsley, pepper, rosemary, sage, savory, sumac, tarragon, thyme   It's not just a diet, it's a lifestyle:  . The Mediterranean diet includes lifestyle factors typical of those in the region  . Foods, drinks and meals are best eaten with others and savored . Daily physical activity is important for overall good health . This could be strenuous exercise like running and aerobics . This could also be more leisurely activities such as walking, housework, yard-work, or taking the stairs . Moderation is the key; a balanced and healthy diet accommodates most foods and drinks . Consider portion sizes and frequency of consumption of certain foods   Meal Ideas & Options:  . Breakfast:  o Whole wheat toast or whole wheat English muffins with peanut butter & hard boiled egg o Steel cut oats topped with apples & cinnamon and skim milk  o Fresh fruit: banana, strawberries, melon, berries, peaches  o Smoothies: strawberries, bananas, greek yogurt, peanut butter o Low fat greek yogurt with blueberries and granola  o Egg white omelet with spinach and mushrooms o Breakfast couscous: whole wheat couscous, apricots, skim milk, cranberries  . Sandwiches:   o Hummus and grilled vegetables (peppers, zucchini, squash) on whole wheat bread   o Grilled chicken on whole wheat pita with lettuce, tomatoes, cucumbers or tzatziki  o Tuna salad on whole wheat bread: tuna salad made with greek yogurt, olives, red peppers, capers, green onions o Garlic rosemary lamb pita: lamb sauted with garlic, rosemary, salt & pepper; add lettuce, cucumber, greek yogurt to pita - flavor with lemon juice and black pepper  . Seafood:  o Mediterranean grilled salmon, seasoned with garlic, basil, parsley, lemon juice and black pepper o Shrimp, lemon, and spinach whole-grain pasta salad made with low fat greek yogurt  o Seared scallops with lemon orzo  o Seared tuna steaks seasoned salt, pepper, coriander topped with tomato mixture of olives, tomatoes, olive oil, minced garlic, parsley, green onions and cappers  . Meats:  o Herbed greek chicken salad with kalamata olives, cucumber, feta  o Red bell peppers stuffed with spinach, bulgur, lean ground beef (or lentils) & topped with feta   o Kebabs: skewers of chicken, tomatoes, onions, zucchini, squash  o [place] burgers: made with red onions, mint, dill, lemon juice, feta  cheese topped with roasted red peppers . Vegetarian o Cucumber salad: cucumbers, artichoke hearts, celery, red onion, feta cheese, tossed in olive oil & lemon juice  o Hummus and whole grain pita points with a greek salad (lettuce, tomato, feta, olives, cucumbers, red onion) o Lentil soup with celery, carrots made with vegetable broth, garlic, salt and pepper  o Tabouli salad: parsley, bulgur, mint, scallions, cucumbers, tomato, radishes, lemon juice, olive oil, salt and pepper.     Guidelines for a Low Cholesterol, Low Saturated Fat Diet   Fats - Limit total intake of fats and oils. - Avoid butter, stick margarine, shortening, lard, palm and coconut oils. - Limit mayonnaise, salad dressings, gravies and sauces, unless they are homemade with low-fat  ingredients. - Limit chocolate. - Choose low-fat and nonfat products, such as low-fat mayonnaise, low-fat or non-hydrogenated peanut butter, low-fat or fat-free salad dressings and nonfat gravy. - Use vegetable oil, such as canola or olive oil. - Look for margarine that does not contain trans fatty acids. - Use nuts in moderate amounts. - Read ingredient labels carefully to determine both amount and type of fat present in foods. Limit saturated and trans fats! - Avoid high-fat processed and convenience foods.  Meats and Meat Alternatives - Choose fish, chicken, [place] and lean meats. - Use dried beans, peas, lentils and tofu. - Limit egg yolks to three to four per week. - If you eat red meat, limit to no more than three servings per week and choose loin or round cuts. - Avoid fatty meats, such as bacon, sausage, franks, luncheon meats and ribs. - Avoid all organ meats, including liver.  Dairy - Choose nonfat or low-fat milk, yogurt and cottage cheese. - Most cheeses are high in fat. Choose cheeses made from non-fat milk, such as mozzarella and ricotta cheese. - Choose light or fat-free cream cheese and sour cream. - Avoid cream and sauces made with cream.  Fruits and Vegetables - Eat a wide variety of fruits and vegetables. - Use lemon juice, vinegar or "mist" olive oil on vegetables. - Avoid adding sauces, fat or oil to vegetables.  Breads, Cereals and Grains - Choose whole-grain breads, cereals, pastas and rice. - Avoid high-fat snack foods, such as granola, cookies, pies, pastries, doughnuts and croissants.  Cooking Tips - Avoid deep fried foods. - Trim visible fat off meats and remove skin from poultry before cooking. - Bake, broil, boil, poach or roast poultry, fish and lean meats. - Drain and discard fat that drains out of meat as you cook it. - Add little or no fat to foods. - Use vegetable oil sprays to grease pans for cooking or baking. - Steam vegetables. - Use herbs  or no-oil marinades to flavor foods.      For those diagnosed with high triglycerides, it's important to take action to lower your levels and improve your heart health.  Triglyceride is just a fancy word for fat - the fat in our bodies is stored in the form of triglycerides. Triglycerides are found in foods and manufactured in our bodies.  Normal triglyceride levels are defined as less than 150 mg/dL; 150 to 199 is considered borderline high; 200 to 499 is high; and 500 or higher is officially called very high. To me, anything over 150 is a red flag indicating my patient needs to take immediate steps to get the situation under control.   What is the significance of high triglycerides? High triglyceride levels make blood thicker and stickier, which  means that it is more likely to form clots. Studies have shown that triglyceride levels are associated with increased risks of cardiovascular disease and stroke - in both men and women - alone or in combination with other risk factors (high triglycerides combined with high LDL cholesterol can be a particularly deadly combination). For example, in one ground-breaking study, high triglycerides alone increased the risk of cardiovascular disease by 14 percent in men, and by 68 percent in women. But when the test subjects also had low HDL cholesterol (that's the good cholesterol) and other risk factors, high triglycerides increased the risk of disease by 32 percent in men and 76 percent in women.   Fortunately, triglycerides can sometimes be controlled with several diet and lifestyle changes.    What Factors Can Increase Triglycerides? As with cholesterol, eating too much of the wrong kinds of fats will raise your blood triglycerides.  Therefore, it's important to restrict the amounts of saturated fats and trans fats you allow into your diet.  Triglyceride levels can also shoot up after eating foods that are high in carbohydrates or after drinking alcohol.   That's why triglyceride blood tests require an overnight fast.  If you have elevated triglycerides, it's especially important to avoid sugary and refined carbohydrates, including sugar, honey, and other sweeteners, soda and other sugary drinks, candy, baked goods, and anything made with white (refined or enriched) flour, including white bread, rolls, cereals, buns, pastries, regular pasta, and white rice.  You'll also want to limit dried fruit and fruit juice since they're dense in simple sugar.  All of these low-quality carbs cause a sudden rise in insulin, which may lead to a spike in triglycerides.  Triglycerides can also become elevated as a reaction to having diabetes, hypothyroidism, or kidney disease. As with most other heart-related factors, being overweight and inactive also contribute to abnormal triglycerides. And unfortunately, some people have a genetic predisposition that causes them to manufacture way too much triglycerides on their own, no matter how carefully they eat.   How Can You Lower Your Triglyceride Levels? If you are diagnosed with high triglycerides, it's important to take action. There are several things you can do to help lower your triglyceride levels and improve your heart health:  Lose weight if you are overweight.  There is a clear correlation between obesity and high triglycerides - the heavier people are, the higher their triglyceride levels are likely to be. The good news is that losing weight can significantly lower triglycerides. In a large study of individuals with type 2 diabetes, those assigned to the "lifestyle intervention group" - which involved counseling, a low-calorie meal plan, and customized exercise program - lost 8.6% of their body weight and lowered their triglyceride levels by more than 16%. If you're overweight, find a weight loss plan that works for you and commit to shedding the pounds and getting healthier.  Reduce the amount of saturated fat and  trans fat in your diet.  Start by avoiding or dramatically limiting butter, cream cheese, lard, sour cream, doughnuts, cakes, cookies, candy bars, regular ice cream, fried foods, pizza, cheese sauce, cream-based sauces and salad dressings, high-fat meats (including fatty hamburgers, bologna, pepperoni, sausage, bacon, salami, pastrami, spareribs, and hot dogs), high-fat cuts of beef and pork, and whole-milk dairy products.   Other ways to cut back: Choose lean meats only (including skinless chicken and [place], lean beef, lean pork), fish, and reduced-fat or fat-free dairy products.   Experiment with adding whole soy foods to  your diet. Although soy itself may not reduce risk of heart disease, it replaces hazardous animal fats with healthier proteins. Choose high-quality soy foods, such as tofu, tempeh, soy milk, and edamame (whole soybeans).  Always remove skin from poultry.  Prepare foods by baking, roasting, broiling, boiling, poaching, steaming, grilling, or stir-frying in vegetable oil.  Most stick margarines contain trans fats, and trans fats are also found in some packaged baked goods, potato chips, snack foods, fried foods, and fast food that use or create hydrogenated oils.    (All food labels must now list the amount of trans fats, right after the amount of saturated fats - good news for consumers. As a result, many food companies have now reformulated their products to be trans fat free.many, but not all! So it's still just as important to read labels and make sure the packaged foods you buy don't contain trans fats.)     If you use margarine, purchase soft-tub margarine spreads that contain 0 grams trans fats and don't list any partially hydrogenated oils in the ingredients list. By substituting olive oil or vegetable oil for trans fats in just 2 percent of your daily calories, you can reduce your risk of heart disease by 53 percent.   There is no safe amount of trans fats, so try to keep  them as far from your plate as possible.  Avoid foods that are concentrated in sugar (even dried fruit and fruit juice). Sugary foods can elevate triglyceride levels in the blood, so keep them to a bare minimum.  Swap out refined carbohydrates for whole grains.  Refined carbohydrates - like white rice, regular pasta, and anything made with white or "enriched" flour (including white bread, rolls, cereals, buns, and crackers) - raise blood sugar and insulin levels more than fiber-rich whole grains. Higher insulin levels, in turn, can lead to a higher rise in triglycerides after a meal. So, make the switch to whole wheat bread, whole grain pasta, brown or wild rice, and whole grain versions of cereals, crackers, and other bread products. However, it's important to know that individuals with high triglycerides should moderate even their intake of high-quality starches (since all starches raise blood sugar) - I recommend 1 to 2 servings per meal.  Cut way back on alcohol.  If you have high triglycerides, alcohol should be considered a rare treat - if you indulge at all, since even small amounts of alcohol can dramatically increase triglyceride levels.  Incorporate omega-3 fats.  Heart-healthy fish oils are especially rich in omega-3 fatty acids. In multiple studies over the past two decades, people who ate diets high in omega-3s had 30 to 40 percent reductions in heart disease. Although we don't yet know why fish oil works so well, there are several possibilities. Omega-3s seem to reduce inflammation, reduce high blood pressure, decrease triglycerides, raise HDL cholesterol, and make blood thinner and less sticky so it is less likely to clot. It's as close to a food prescription for heart health as it gets. If you have high triglycerides, I recommend eating at least three servings of one of the omega-3-rich fish every week (fatty fish is the most concentrated food form of omega three fats). If you cannot  manage to eat that much fish, speak with your physician about taking fish oil capsules, which offer similar benefits.The best foods for omega-3 fatty acids include wild salmon (fresh, canned), herring, mackerel (not king), sardines, anchovies, rainbow trout, and Pacific oysters. Non-fish sources of omega-3 fats include  omega-3-fortified eggs, ground flaxseed, chia seeds, walnuts, butternuts (white walnuts), seaweed, walnut oil, canola oil, and soybeans.  Quit smoking.  Smoking causes inflammation, not just in your lungs, but throughout your body. Inflammation can contribute to atherosclerosis, blood clots, and risk of heart attack. Smoking makes all heart health indicators worse. If you have high cholesterol, high triglycerides, or high blood pressure, smoking magnifies the danger.  Become more physically active.  Even moderate exercise can help improve cholesterol, triglycerides, and blood pressure. Aerobic exercise seems to be able to stop the sharp rise of triglycerides after eating, perhaps because of a decrease in the amount of triglyceride released by the liver, or because active muscle clears triglycerides out of the blood stream more quickly than inactive muscle. If you haven't exercised regularly (or at all) for years, I recommend starting slowly, by walking at an easy pace for 15 minutes a day. Then, as you feel more comfortable, increase the amount. Your ultimate goal should be at least 30 minutes of moderate physical activity, at least five days a week.

## 2016-02-06 ENCOUNTER — Telehealth: Payer: Self-pay | Admitting: Cardiovascular Disease

## 2016-02-06 NOTE — Telephone Encounter (Addendum)
A Request for Amendment of Health Information received on February 05, 2016. Forwarded documents to Lyman Bishop MD [facility]

## 2016-02-21 ENCOUNTER — Other Ambulatory Visit: Payer: Self-pay | Admitting: Internal Medicine

## 2016-02-21 NOTE — Telephone Encounter (Signed)
Forwarding to Pt's new PCP.

## 2016-05-20 ENCOUNTER — Telehealth: Payer: Self-pay | Admitting: Family Medicine

## 2016-05-20 NOTE — Telephone Encounter (Signed)
Pt clld states she has noticed a knot on the left side of neck causing facial & ear pain-- also has what maybe a Kidney or bladder infection seeks doctor visit- no appt availability--referred to  [facility] or [facility]--glh

## 2016-06-17 ENCOUNTER — Ambulatory Visit: Payer: Medicare Other | Admitting: Adult Health

## 2016-06-22 ENCOUNTER — Other Ambulatory Visit: Payer: Self-pay | Admitting: Family Medicine

## 2016-06-26 ENCOUNTER — Ambulatory Visit: Payer: Medicare Other | Admitting: Adult Health

## 2016-07-16 ENCOUNTER — Ambulatory Visit (INDEPENDENT_AMBULATORY_CARE_PROVIDER_SITE_OTHER): Payer: Medicare Other | Admitting: Adult Health

## 2016-07-16 ENCOUNTER — Encounter: Payer: Self-pay | Admitting: Adult Health

## 2016-07-16 DIAGNOSIS — F339 Major depressive disorder, recurrent, unspecified: Secondary | ICD-10-CM | POA: Diagnosis not present

## 2016-07-16 DIAGNOSIS — M542 Cervicalgia: Secondary | ICD-10-CM | POA: Diagnosis not present

## 2016-07-16 MED ORDER — CYCLOBENZAPRINE HCL 10 MG PO TABS: 10.0000 mg | ORAL_TABLET | Freq: Every evening | ORAL | 0 refills | Status: DC | PRN

## 2016-07-16 NOTE — Assessment & Plan Note (Signed)
She reports not having started Fluoxetine that Dr. Raliegh Scarlet prescribed 2017. She declined CBT referral today. She denies thoughts of harming herself/others.   She will call clinic/schedule OV when/if she would like a CBT referral.
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
## 2016-07-16 NOTE — Assessment & Plan Note (Deleted)
She reports not having started Fluoxetine that Dr. Raliegh Scarlet prescribed 2017. She declined CBT referral today. She denies thoughts of harming herself/others.   She will call clinic/schedule OV when/if she would like a CBT referral.

## 2016-07-16 NOTE — Patient Instructions (Addendum)
Muscle Cramps and Spasms Muscle cramps and spasms occur when a muscle or muscles tighten and you have no control over this tightening (involuntary muscle contraction). They are a common problem and can develop in any muscle. The most common place is in the calf muscles of the leg. Muscle cramps and muscle spasms are both involuntary muscle contractions, but there are some differences between the two:  Muscle cramps are painful. They come and go and may last a few seconds to 15 minutes. Muscle cramps are often more forceful and last longer than muscle spasms.  Muscle spasms may or may not be painful. They may also last just a few seconds or much longer. Certain medical conditions, such as diabetes or Parkinson disease, can make it more likely to develop cramps or spasms. However, cramps or spasms are usually not caused by a serious underlying problem. Common causes include:  Overexertion.  Overuse from repetitive motions, or doing the same thing over and over.  Remaining in a certain position for a long period of time.  Improper preparation, form, or technique while playing a sport or doing an activity.  Dehydration.  Injury.  Side effects of some medicines.  Abnormally low levels of the salts and ions in your blood (electrolytes), especially potassium and calcium. This could happen if you are taking water pills (diuretics) or if you are pregnant. In many cases, the cause of muscle cramps or spasms is unknown. Follow these instructions at home:  Stay well hydrated. Drink enough fluid to keep your urine clear or pale yellow.  Try massaging, stretching, and relaxing the affected muscle.  If directed, apply heat to tight or tense muscles as often as told by your health care provider. Use the heat source that your health care provider recommends, such as a moist heat pack or a heating pad.  Place a towel between your skin and the heat source.  Leave the heat on for 20-30  minutes.  Remove the heat if your skin turns bright red. This is especially important if you are unable to feel pain, heat, or cold. You may have a greater risk of getting burned.  If directed, put ice on the affected area. This may help if you are sore or have pain after a cramp or spasm.  Put ice in a plastic bag.  Place a towel between your skin and the bag.  Leavethe ice on for 20 minutes, 2-3 times a day.  Take over-the-counter and prescription medicines only as told by your health care provider.  Pay attention to any changes in your symptoms. Contact a health care provider if:  Your cramps or spasms get more severe or happen more often.  Your cramps or spasms do not improve over time. This information is not intended to replace advice given to you by your health care provider. Make sure you discuss any questions you have with your health care provider. Document Released: 09/14/2001 Document Revised: 04/26/2015 Document Reviewed: 12/27/2014 Elsevier Interactive Patient Education  2017 [employer]  Start Omeprazole as directed by ENT. Cyclobenzaprine at bedtime as needed for muscle spasm. Increase water intake. Heating pad to neck for 20 mins several times daily. Warm Epson Salt baths. Please call clinic with any questions/concerns.

## 2016-07-16 NOTE — Assessment & Plan Note (Addendum)
Please start Omeprazole as directed by ENT. Believe neck pain is r/t to stress and tension. Cyclobenzaprine at bedtime as needed for muscle spasm. Increase water intake. Heating pad to neck for 20 mins several times daily. Warm Epson Salt baths. Please call clinic with any questions/concerns.

## 2016-07-30 ENCOUNTER — Other Ambulatory Visit: Payer: Self-pay | Admitting: Family Medicine

## 2016-07-30 MED ORDER — DILTIAZEM HCL ER COATED BEADS 360 MG PO TB24: 360.0000 mg | ORAL_TABLET | Freq: Every day | ORAL | 0 refills | Status: DC

## 2016-07-30 NOTE — Telephone Encounter (Addendum)
Pt was seen 07/16/16 by Lillard Anes, NP but HTN was not evaluated at that time.  BP was good at this office visit.  Is it okay to refill this medication?  Charyl Bigger, CMA    ------------------------------------------------------------------------------------  Pt has Cardiologist- Dr Debara Pickett and has not seen him since 07/2014.  She needs appt with them- as they told her to f/up in 35mo at that time.   It is best she get further RF's from them for her heart.  I will RF for 90d for now, but in future- she will need to get from her cardiologist.   Dr Raliegh Scarlet.  07/30/16  3:49pm

## 2016-07-30 NOTE — Addendum Note (Signed)
Addended by: Mellody Dance on: 07/30/2016 03:58 PM   Modules accepted: Orders

## 2016-08-28 ENCOUNTER — Other Ambulatory Visit: Payer: Self-pay | Admitting: Family Medicine

## 2016-09-11 ENCOUNTER — Encounter: Payer: Self-pay | Admitting: Adult Health

## 2016-09-11 ENCOUNTER — Ambulatory Visit (INDEPENDENT_AMBULATORY_CARE_PROVIDER_SITE_OTHER): Payer: Medicare Other | Admitting: Adult Health

## 2016-09-11 ENCOUNTER — Encounter: Payer: Self-pay | Admitting: Family Medicine

## 2016-09-11 DIAGNOSIS — H6692 Otitis media, unspecified, left ear: Secondary | ICD-10-CM | POA: Diagnosis not present

## 2016-09-11 MED ORDER — FLUCONAZOLE 150 MG PO TABS: 150.0000 mg | ORAL_TABLET | Freq: Once | ORAL | 0 refills | Status: AC

## 2016-09-11 MED ORDER — AMOXICILLIN-POT CLAVULANATE 875-125 MG PO TABS: 1.0000 | ORAL_TABLET | Freq: Two times a day (BID) | ORAL | 0 refills | Status: DC

## 2016-09-11 NOTE — Assessment & Plan Note (Signed)
Ear Lavage performed-tolerated well and hearing greatly improved. Augmentin x 10 d Diflucan-hx of yeast inf with ABX use. Continue OTC antihistamine as directed by manufacturer. OTC Debrox as directed by manufacturer. If sx's persist after ABX completed, then RTC.

## 2016-09-11 NOTE — Progress Notes (Signed)
   Subjective:    Patient ID: Kayla [name], female    DOB: 1945-11-13, 71 y.o.   MRN: [id number]  HPI :  Kayla [name] is here for two complaints: 1) Left ear "fullness and I can't hear a thing" which started Monday.   She reports that her seasonal allergies have flared up the last few weeks- lymph nodes are swollen on L side of neck, intermittent non-productive cough and minor pressure on L side of face that are not improving.   2) Low back pressure that developed over weekend.  She has hx of chronic lumbar pain and fibromyalgia. She denies abdominal pressure/pain.  She denies dysuria/hematuria/frequency/hesitancy. She reports urinating every 2 hrs at night, however she consumes liquid until midnight.  She denies fever/night sweats/malaise.   Of Note: She has hx of hearing loss.  Review of Systems  Constitutional: Positive for fatigue. Negative for activity change, appetite change, chills, diaphoresis, fever and unexpected weight change.  Eyes: Negative for visual disturbance.  Respiratory: Negative for cough, chest tightness, shortness of breath, wheezing and stridor.   Cardiovascular: Negative for chest pain, palpitations and leg swelling.  Gastrointestinal: Negative for abdominal distention, abdominal pain, blood in stool, constipation, diarrhea, nausea and vomiting.  Endocrine: Negative for cold intolerance, heat intolerance, polydipsia, polyphagia and polyuria.  Genitourinary: Negative for decreased urine volume, difficulty urinating, dysuria, flank pain, frequency, hematuria and urgency.  Musculoskeletal: Positive for arthralgias, back pain, gait problem, joint swelling, myalgias, neck pain and neck stiffness.  Skin: Negative for color change, pallor, rash and wound.  Allergic/Immunologic: Negative for immunocompromised state.  Neurological: Negative for dizziness and headaches.  Hematological: Does not bruise/bleed easily.       Objective:   Physical Exam  Constitutional: She appears  well-developed and well-nourished. No distress.  HENT:  Head: Normocephalic and atraumatic.  Right Ear: External ear and ear canal normal. Tympanic membrane is erythematous and bulging. Decreased hearing is noted.  Left Ear: External ear and ear canal normal. Tympanic membrane is erythematous and bulging. Decreased hearing is noted.  Nose: Mucosal edema and rhinorrhea present. Right sinus exhibits no maxillary sinus tenderness and no frontal sinus tenderness. Left sinus exhibits maxillary sinus tenderness and frontal sinus tenderness.  Mouth/Throat: Uvula is midline.  Unable to visualize L TM due to cerumen impaction. Ear lavage completed, then able to visualize.  Eyes: Conjunctivae are normal. Pupils are equal, round, and reactive to light.  Neck: Normal range of motion. Neck supple.    Palpable lymph node  Cardiovascular: Normal rate, regular rhythm and normal heart sounds.   Pulmonary/Chest: Effort normal and breath sounds normal. No respiratory distress. She has no wheezes. She has no rales. She exhibits no tenderness.  Abdominal: Soft. Bowel sounds are normal. She exhibits no distension and no mass. There is no tenderness. There is no rebound, no guarding and no CVA tenderness.  Lymphadenopathy:    She has cervical adenopathy.  Skin: Skin is warm and dry. No rash noted. She is not diaphoretic. No erythema. No pallor.  Psychiatric: She has a normal mood and affect. Her behavior is normal. Judgment and thought content normal.  Nursing note and vitals reviewed.         Assessment & Plan:

## 2016-09-11 NOTE — Patient Instructions (Addendum)

## 2016-09-19 ENCOUNTER — Telehealth: Payer: Self-pay

## 2016-09-19 NOTE — Telephone Encounter (Signed)
Refill request from [facility] for Vitamin D 50,000 units. We haven not written for medication. Last vitamin D results was 33 done 01/02/2016.   Please advise on refill.  MPulliam, CMA

## 2016-09-20 ENCOUNTER — Other Ambulatory Visit: Payer: Self-pay | Admitting: Family Medicine

## 2016-09-20 MED ORDER — VITAMIN D (ERGOCALCIFEROL) 1.25 MG (50000 UNIT) PO CAPS: 50000.0000 [IU] | ORAL_CAPSULE | ORAL | 10 refills | Status: DC

## 2016-10-07 ENCOUNTER — Other Ambulatory Visit: Payer: Self-pay | Admitting: Family Medicine

## 2016-11-07 ENCOUNTER — Other Ambulatory Visit: Payer: Self-pay

## 2016-11-07 ENCOUNTER — Telehealth: Payer: Self-pay | Admitting: Family Medicine

## 2016-11-07 DIAGNOSIS — F419 Anxiety disorder, unspecified: Secondary | ICD-10-CM

## 2016-11-07 MED ORDER — ALPRAZOLAM 0.5 MG PO TABS: 0.5000 mg | ORAL_TABLET | Freq: Two times a day (BID) | ORAL | 0 refills | Status: DC | PRN

## 2016-11-07 NOTE — Telephone Encounter (Signed)
Pt called request Rx for Xanax .05MG  tablets-- says provider gave prescription for month back but Patient has misplaced Rx(says never got Rx filled @ pharmacy)--- Needs to know if Rx approved & called into Pharmacy by 3:00pm(she will lose car/transportation to son). -- Pls call her at [phone]. --glh

## 2016-11-07 NOTE — Telephone Encounter (Signed)
RX written per Dr Raliegh Scarlet up front for patient to pick up.  Patient notified.  MPulliam, CMA/RT(R)

## 2016-11-14 ENCOUNTER — Ambulatory Visit (INDEPENDENT_AMBULATORY_CARE_PROVIDER_SITE_OTHER): Payer: Medicare Other | Admitting: Adult Health

## 2016-11-14 ENCOUNTER — Encounter: Payer: Self-pay | Admitting: Adult Health

## 2016-11-14 VITALS — BP 130/79 | HR 72 | Ht 64.0 in | Wt 218.8 lb

## 2016-11-14 DIAGNOSIS — F411 Generalized anxiety disorder: Secondary | ICD-10-CM | POA: Diagnosis not present

## 2016-11-14 DIAGNOSIS — E039 Hypothyroidism, unspecified: Secondary | ICD-10-CM | POA: Diagnosis not present

## 2016-11-14 DIAGNOSIS — R5383 Other fatigue: Secondary | ICD-10-CM | POA: Diagnosis not present

## 2016-11-14 DIAGNOSIS — R251 Tremor, unspecified: Secondary | ICD-10-CM | POA: Diagnosis not present

## 2016-11-14 MED ORDER — FLUOXETINE HCL 20 MG PO TABS: 20.0000 mg | ORAL_TABLET | Freq: Every day | ORAL | 3 refills | Status: DC

## 2016-11-14 NOTE — Assessment & Plan Note (Signed)
TSH level drawn. Currently on Levothyroxine 200mg  daily

## 2016-11-14 NOTE — Assessment & Plan Note (Signed)
EKG-normal Re-started on Prozac 20mg  daily. Mental health referral placed. Continue to drink plenty of fluids and eat a heart healthy diet. We will cal when lab results are available. Follow-up in 4 weeks, sooner if needed.

## 2016-11-14 NOTE — Patient Instructions (Signed)

## 2016-11-14 NOTE — Progress Notes (Signed)
Subjective:    Patient ID: Kayla [name], female    DOB: 04-09-1945, 71 y.o.   MRN: [id number]  HPI: [name] presents with increased anxiety r/t to financial struggles.  She has hx of depression, anxiety, and fibromyalgia.  She also reports "body shaking, mostly from my waist up" that has been intermittent for the last 3 months.  She reported that this occurred 7 years ago and it was related to hypokalemia.  She denies tobacco/EOTH use and drinks at least 60 ounces water and fruit juices day.  She was provided alprazolam rx last week which she reports she lost.  She denies CP/dyspnea at rest/palpitations.  Her last TSH was 01/02/17- 4.50, she is taking levothyroxine 245mcg daily. She was previously on Prozac, tolerated it well without SE and good sx control.  Patient Care Team    Relationship Specialty Notifications Start End  Mina Marble D, NP PCP - General Family Medicine  11/14/16   Allyn Kenner, MD  Dermatology  12/20/15   Pixie Casino, MD Consulting Physician Cardiology  01/01/16   Lahoma Rocker, MD Referring Physician Rheumatology  01/01/16    Comment: fibromyalgia/  and osteopania and scolisois    Patient Active Problem List   Diagnosis Date Noted  . Shakiness 11/14/2016  . Otitis media of left ear 09/11/2016  . Depression, recurrent ([place]) 07/16/2016  . Adjustment disorder with mixed anxiety and depressed mood 01/15/2016  . Fe Def Anemia 12/22/2015  . Scoliosis (and kyphoscoliosis), idiopathic 12/20/2015  . Osteopenia 12/20/2015  . PCP NOTES >>> 01/22/2015  . Fibromyalgia 08/02/2014  . Chest pain 07/07/2014  . DOE (dyspnea on exertion)- chronic 07/07/2014  . GAD (generalized anxiety disorder) 09/17/2012  . UTI (lower urinary tract infection) 01/24/2012  . Vitamin D deficiency 01/24/2012  . Insomnia 01/24/2012  . Fatigue 10/14/2011  . Neck pain 07/23/2010  . Medicare annual wellness visit, initial 07/23/2010  . B12 deficiency   . Hyperlipidemia 11/30/2007  . CAD (coronary  artery disease) 11/30/2007  . DYSPNEA 11/30/2007  . PALPITATIONS, OCCASIONAL 04/15/2007  . Hypothyroidism 01/27/2007  . Essential hypertension 01/27/2007  . Reactive airway disease that is not asthma 01/27/2007  . GERD 01/27/2007  . IBS 01/27/2007  . BACK PAIN, CHRONIC 01/27/2007     Past Medical History:  Diagnosis Date  . Anemia   . Asthma   . B12 deficiency   . Chronic LBP   . Diastolic dysfunction   . Fibromyalgia   . GERD (gastroesophageal reflux disease)   . Hyperlipemia   . Hypertension   . Hypothyroid   . IBS (irritable bowel syndrome)   . S/P cardiac cath 11/30/2007   normal coronaries - Dr. Burt Knack (Dr. Debara Pickett reviewed films on 01/10/2016)     Past Surgical History:  Procedure Laterality Date  . CESAREAN SECTION    . CHOLECYSTECTOMY    . NECK SURGERY  1998     Family History  Problem Relation Age of Onset  . Heart attack Father 34  . Lung cancer Unknown        uncle, non smoker  . Leukemia Unknown        uncle  . Cancer Mother        oral  . Stroke Mother   . Cancer Maternal Aunt        breast  . Alcohol abuse Maternal Aunt   . Alcohol abuse Maternal Uncle   . Cancer Maternal Uncle        lung, stomach, oral  .  Cancer Paternal Uncle        lung  . Congestive Heart Failure Maternal Grandfather   . Heart attack Paternal Grandfather   . Cancer Paternal Uncle        bone  . Colon cancer Neg Hx   . Breast cancer Neg Hx   . Diabetes Neg Hx      History  Drug Use No     History  Alcohol Use No     History  Smoking Status  . Never Smoker  Smokeless Tobacco  . Never Used     Outpatient Encounter Prescriptions as of 11/14/2016  Medication Sig  . ALPRAZolam (XANAX) 0.5 MG tablet Take 1 tablet (0.5 mg total) by mouth 2 (two) times daily as needed for anxiety.  Marland Kitchen amoxicillin-clavulanate (AUGMENTIN) 875-125 MG tablet Take 1 tablet by mouth 2 (two) times daily.  Marland Kitchen aspirin 81 MG tablet Take 81 mg by mouth daily.  . [employer] VITAMIN B12 1000 MCG  tablet TAKE 1 TABLET (1,000 MCG TOTAL) BY MOUTH DAILY.  Marland Kitchen Cyanocobalamin (VITAMIN B 12 PO) Take 1 tablet by mouth daily.  . cyclobenzaprine (FLEXERIL) 10 MG tablet Take 1 tablet (10 mg total) by mouth at bedtime as needed for muscle spasms.  . ferrous sulfate 325 (65 FE) MG tablet TAKE 1 TABLET BY MOUTH 2 TIMES DAILY WITH A MEAL  . levothyroxine (SYNTHROID, LEVOTHROID) 200 MCG tablet TAKE 1 TABLET (200 MCG TOTAL) BY MOUTH DAILY BEFORE BREAKFAST.  . magnesium 30 MG tablet Take 30 mg by mouth daily.  Marland Kitchen MATZIM LA 360 MG 24 hr tablet TAKE 1 TABLET (360 MG TOTAL) BY MOUTH DAILY. (FUTURE RF'S PER CARDS- DR HILTY)  . omega-3 acid ethyl esters (LOVAZA) 1 g capsule Take 2 capsules (2 g total) by mouth 2 (two) times daily.  . Potassium 75 MG TABS Take 1 tablet by mouth. One tablet 2-3 times per week  . Vitamin D, Ergocalciferol, (DRISDOL) 50000 units CAPS capsule Take 1 capsule (50,000 Units total) by mouth once a week.  Marland Kitchen FLUoxetine (PROZAC) 20 MG tablet Take 1 tablet (20 mg total) by mouth daily.   No facility-administered encounter medications on file as of 11/14/2016.     Allergies: Ace inhibitors and Lisinopril  Body mass index is 37.56 kg/m.  Blood pressure 130/79, pulse 72, height 5\' 4"  (1.626 m), weight 218 lb 12.8 oz (99.2 kg).     Review of Systems  Constitutional: Negative for activity change, appetite change, chills, diaphoresis, fever and unexpected weight change.  HENT: Negative for congestion.   Eyes: Negative for visual disturbance.  Respiratory: Negative for cough, chest tightness, shortness of breath and wheezing.   Cardiovascular: Negative for chest pain, palpitations and leg swelling.  Gastrointestinal: Negative for abdominal distention, abdominal pain, blood in stool, constipation, diarrhea, nausea and vomiting.  Endocrine: Negative for cold intolerance, heat intolerance, polydipsia, polyphagia and polyuria.  Genitourinary: Negative for difficulty urinating and flank pain.   Musculoskeletal: Positive for arthralgias, back pain, gait problem, joint swelling, myalgias, neck pain and neck stiffness.  Skin: Negative for color change, pallor and rash.  Allergic/Immunologic: Negative for immunocompromised state.  Neurological: Negative for dizziness, tremors, weakness and headaches.  Psychiatric/Behavioral: Positive for dysphoric mood and sleep disturbance. Negative for agitation, behavioral problems, confusion, decreased concentration, hallucinations, self-injury and suicidal ideas. The patient is nervous/anxious. The patient is not hyperactive.        Objective:   Physical Exam  Constitutional: She is oriented to person, place, and time. She appears  well-developed and well-nourished. She appears distressed.  HENT:  Head: Normocephalic and atraumatic.  Right Ear: External ear normal.  Left Ear: External ear normal.  Eyes: Pupils are equal, round, and reactive to light. Conjunctivae are normal.  Neck: Normal range of motion. Neck supple.  Cardiovascular: Normal rate, regular rhythm, normal heart sounds and intact distal pulses.   No murmur heard. Pulmonary/Chest: Effort normal and breath sounds normal. No respiratory distress. She has no wheezes. She has no rales. She exhibits no tenderness.  Lymphadenopathy:    She has no cervical adenopathy.  Neurological: She is alert and oriented to person, place, and time. Coordination normal.  Skin: Skin is warm. No rash noted. She is not diaphoretic. No erythema. No pallor.  Psychiatric: Her speech is normal and behavior is normal. Judgment and thought content normal. Her mood appears anxious. Cognition and memory are normal.  Tearful and cried several times during OV, especially when speaking about financial worries and her son recently denied his disability.  Nursing note and vitals reviewed.         Assessment & Plan:   1. Shakiness   2. Hypothyroidism, unspecified type   3. GAD (generalized anxiety disorder)    4. Fatigue, unspecified type     Hypothyroidism TSH level drawn. Currently on Levothyroxine 200mg  daily  Fatigue TSH and CMP drawn today.  Shakiness EKG-normal Re-started on Prozac 20mg  daily. Mental health referral placed. Continue to drink plenty of fluids and eat a heart healthy diet. We will cal when lab results are available. Follow-up in 4 weeks, sooner if needed.  GAD (generalized anxiety disorder) Re-started on Prozac 20mg  daily. She will call when she requires RF. Mental health referral placed.    FOLLOW-UP:  Return in about 4 weeks (around 12/12/2016) for Anxiety, Depression.

## 2016-11-14 NOTE — Assessment & Plan Note (Signed)
TSH and CMP drawn today.

## 2016-11-14 NOTE — Assessment & Plan Note (Addendum)
Re-started on Prozac 20mg  daily. She will call when she requires RF. Mental health referral placed.

## 2016-11-15 ENCOUNTER — Other Ambulatory Visit: Payer: Self-pay | Admitting: Adult Health

## 2016-11-15 DIAGNOSIS — E038 Other specified hypothyroidism: Secondary | ICD-10-CM

## 2016-11-15 LAB — COMPREHENSIVE METABOLIC PANEL
ALT: 8 IU/L (ref 0–32)
AST: 13 IU/L (ref 0–40)
Albumin/Globulin Ratio: 1.6 (ref 1.2–2.2)
Albumin: 4.1 g/dL (ref 3.5–4.8)
Alkaline Phosphatase: 108 IU/L (ref 39–117)
BUN/Creatinine Ratio: 12 (ref 12–28)
BUN: 11 mg/dL (ref 8–27)
Bilirubin Total: 0.2 mg/dL (ref 0.0–1.2)
CO2: 23 mmol/L (ref 20–29)
Calcium: 9.9 mg/dL (ref 8.7–10.3)
Chloride: 106 mmol/L (ref 96–106)
Creatinine, Ser: 0.93 mg/dL (ref 0.57–1.00)
GFR calc Af Amer: 72 mL/min/{1.73_m2} (ref >59)
GFR calc non Af Amer: 62 mL/min/{1.73_m2} (ref >59)
Globulin, Total: 2.6 g/dL (ref 1.5–4.5)
Glucose: 111 mg/dL — ABNORMAL HIGH (ref 65–99)
Potassium: 4.3 mmol/L (ref 3.5–5.2)
Sodium: 143 mmol/L (ref 134–144)
Total Protein: 6.7 g/dL (ref 6.0–8.5)

## 2016-11-15 LAB — TSH: TSH: 0.258 u[IU]/mL — ABNORMAL LOW (ref 0.450–4.500)

## 2016-11-15 MED ORDER — LEVOTHYROXINE SODIUM 150 MCG PO TABS: 150.0000 ug | ORAL_TABLET | Freq: Every day | ORAL | 0 refills | Status: DC

## 2016-11-27 ENCOUNTER — Other Ambulatory Visit: Payer: Self-pay | Admitting: Adult Health

## 2016-11-27 DIAGNOSIS — F419 Anxiety disorder, unspecified: Secondary | ICD-10-CM

## 2016-11-27 NOTE — Telephone Encounter (Signed)
Patient is requesting a refill of her Xanax, she states that she knows that she is not do for a refill but she took them out of town to her brother's house and they went missing. Please advise

## 2016-11-28 MED ORDER — ALPRAZOLAM 0.5 MG PO TABS: 0.5000 mg | ORAL_TABLET | Freq: Two times a day (BID) | ORAL | 0 refills | Status: DC | PRN

## 2016-11-28 NOTE — Telephone Encounter (Signed)
LVM for pt to call to discuss.  T. Ray Gervasi, CMA  

## 2016-11-28 NOTE — Telephone Encounter (Signed)
Please review and authorize refill if appropriate.  Charyl Bigger, CMA

## 2016-11-28 NOTE — Addendum Note (Signed)
Addended by: Fonnie Mu on: 11/28/2016 09:14 AM   Modules accepted: Orders

## 2016-11-28 NOTE — Telephone Encounter (Signed)
This will be final RF.  She was re-started on Prozac at last OV and that medication should soon provide anxiety control. Thanks! Valetta Fuller

## 2016-11-28 NOTE — Telephone Encounter (Signed)
Pt informed.  Pt expressed understanding.  T. Kirsti Mcalpine, CMA 

## 2016-12-04 ENCOUNTER — Telehealth: Payer: Self-pay

## 2016-12-04 NOTE — Telephone Encounter (Signed)
Pt walked in to the office at 4:45pm stating that she believed her tongue was swelling because she "keeps biting it".  Pt denies any SOB, wheezing, hives or itching.  Pt states that she had an "anaphylactic reaction years ago" where she had been experiencing tongue swelling for 4 months.  Pt requested that I evaluate her tongue to determine if she was having swelling and needed to see "someone" about it since the providers were unavailable.  Advised pt that it is beyond my scope of practice to diagnosis or treat a patient, but that if she believed that she was truly having an anaphylactic reaction then she should proceed to the ED.  Pt expressed understanding and refused EMS call for transportation.  Charyl Bigger, CMA

## 2017-01-27 ENCOUNTER — Other Ambulatory Visit: Payer: Self-pay | Admitting: Adult Health

## 2017-01-27 NOTE — Telephone Encounter (Signed)
RX for levothyroxine refused.  Pt was to have returned for a TSH and OV recheck.  Please call pt to schedule appointment.  Thanks!

## 2017-02-10 ENCOUNTER — Other Ambulatory Visit: Payer: Self-pay | Admitting: Family Medicine

## 2017-02-10 ENCOUNTER — Other Ambulatory Visit: Payer: Self-pay | Admitting: Adult Health

## 2017-02-11 NOTE — Telephone Encounter (Signed)
Only given 30 day supply.  Must have TSH done before any further refills.  Charyl Bigger, CMA

## 2017-02-13 ENCOUNTER — Encounter: Payer: Self-pay | Admitting: Adult Health

## 2017-02-13 ENCOUNTER — Telehealth: Payer: Self-pay | Admitting: Adult Health

## 2017-02-13 ENCOUNTER — Ambulatory Visit (INDEPENDENT_AMBULATORY_CARE_PROVIDER_SITE_OTHER): Payer: Medicare Other | Admitting: Adult Health

## 2017-02-13 VITALS — BP 135/85 | HR 86 | Ht 64.0 in | Wt 215.2 lb

## 2017-02-13 DIAGNOSIS — F411 Generalized anxiety disorder: Secondary | ICD-10-CM | POA: Diagnosis not present

## 2017-02-13 DIAGNOSIS — E038 Other specified hypothyroidism: Secondary | ICD-10-CM | POA: Diagnosis not present

## 2017-02-13 MED ORDER — LEVOTHYROXINE SODIUM 150 MCG PO TABS: 150.0000 ug | ORAL_TABLET | Freq: Every day | ORAL | 0 refills | Status: DC

## 2017-02-13 MED ORDER — DILTIAZEM HCL ER COATED BEADS 360 MG PO TB24
360.0000 mg | ORAL_TABLET | Freq: Every day | ORAL | 0 refills | Status: DC
Start: 2017-02-13 — End: 2017-10-29

## 2017-02-13 NOTE — Addendum Note (Signed)
Addended by: Mina Marble D on: 02/13/2017 02:49 PM   Modules accepted: Orders

## 2017-02-13 NOTE — Progress Notes (Signed)
Subjective:    Patient ID: Kayla [name], female    DOB: 10-01-1945, 71 y.o.   MRN: [id number]  HPI: OV Notes 11/14/2016:  Kayla [name] presents with increased anxiety r/t to financial struggles.  She has hx of depression, anxiety, and fibromyalgia.  She also reports "body shaking, mostly from my waist up" that has been intermittent for the last 3 months.  She reported that this occurred 7 years ago and it was related to hypokalemia.  She denies tobacco/EOTH use and drinks at least 60 ounces water and fruit juices day.  She was provided alprazolam rx last week which she reports she lost.  She denies CP/dyspnea at rest/palpitations.  Her last TSH was 01/02/17- 4.50, she is taking levothyroxine 255mcg daily. She was previously on Prozac, tolerated it well without SE and good sx control.  Today's OV Notes 02/13/2017: Kayla [name] is here for f/u: anxiety and thyroid level lab draw.  She has not taken  Fluoxetine in >3 weeks and reports sig decrease in anxiety.  She denies CP/dyspnea/palpitations. Her levothyroxine was reduced from 200 to 198mcg daily in August (11/14/16 TSH 0.258), which may have helped with improvement of mood. She estimates to drink 40 oz water/day and been trying to reduce CHO/sugar/satruated fat intake- she has >3 lbs since last OV in August.  Patient Care Team    Relationship Specialty Notifications Start End  Mina Marble D, NP PCP - General Family Medicine  11/14/16   Allyn Kenner, MD  Dermatology  12/20/15   Pixie Casino, MD Consulting Physician Cardiology  01/01/16   Lahoma Rocker, MD Referring Physician Rheumatology  01/01/16    Comment: fibromyalgia/  and osteopania and scolisois    Patient Active Problem List   Diagnosis Date Noted  . Shakiness 11/14/2016  . Otitis media of left ear 09/11/2016  . Depression, recurrent ([place]) 07/16/2016  . Adjustment disorder with mixed anxiety and depressed mood 01/15/2016  . Fe Def Anemia 12/22/2015  . Scoliosis (and kyphoscoliosis), idiopathic  12/20/2015  . Osteopenia 12/20/2015  . PCP NOTES >>> 01/22/2015  . Fibromyalgia 08/02/2014  . Chest pain 07/07/2014  . DOE (dyspnea on exertion)- chronic 07/07/2014  . GAD (generalized anxiety disorder) 09/17/2012  . UTI (lower urinary tract infection) 01/24/2012  . Vitamin D deficiency 01/24/2012  . Insomnia 01/24/2012  . Fatigue 10/14/2011  . Neck pain 07/23/2010  . Medicare annual wellness visit, initial 07/23/2010  . B12 deficiency   . Hyperlipidemia 11/30/2007  . CAD (coronary artery disease) 11/30/2007  . DYSPNEA 11/30/2007  . PALPITATIONS, OCCASIONAL 04/15/2007  . Hypothyroidism 01/27/2007  . Essential hypertension 01/27/2007  . Reactive airway disease that is not asthma 01/27/2007  . GERD 01/27/2007  . IBS 01/27/2007  . BACK PAIN, CHRONIC 01/27/2007     Past Medical History:  Diagnosis Date  . Anemia   . Asthma   . B12 deficiency   . Chronic LBP   . Diastolic dysfunction   . Fibromyalgia   . GERD (gastroesophageal reflux disease)   . Hyperlipemia   . Hypertension   . Hypothyroid   . IBS (irritable bowel syndrome)   . S/P cardiac cath 11/30/2007   normal coronaries - Dr. Burt Knack (Dr. Debara Pickett reviewed films on 01/10/2016)     Past Surgical History:  Procedure Laterality Date  . CESAREAN SECTION    . CHOLECYSTECTOMY    . NECK SURGERY  1998     Family History  Problem Relation Age of Onset  . Heart attack Father  80  . Lung cancer Unknown        uncle, non smoker  . Leukemia Unknown        uncle  . Cancer Mother        oral  . Stroke Mother   . Cancer Maternal Aunt        breast  . Alcohol abuse Maternal Aunt   . Alcohol abuse Maternal Uncle   . Cancer Maternal Uncle        lung, stomach, oral  . Cancer Paternal Uncle        lung  . Congestive Heart Failure Maternal Grandfather   . Heart attack Paternal Grandfather   . Cancer Paternal Uncle        bone  . Colon cancer Neg Hx   . Breast cancer Neg Hx   . Diabetes Neg Hx      Social  History   Substance and Sexual Activity  Drug Use No     Social History   Substance and Sexual Activity  Alcohol Use No     Social History   Tobacco Use  Smoking Status Never Smoker  Smokeless Tobacco Never Used     Outpatient Encounter Medications as of 02/13/2017  Medication Sig  . aspirin 81 MG tablet Take 81 mg by mouth daily.  . [employer]S VITAMIN B12 1000 MCG tablet TAKE 1 TABLET (1,000 MCG TOTAL) BY MOUTH DAILY.  Marland Kitchen Cyanocobalamin (VITAMIN B 12 PO) Take 1 tablet by mouth daily.  . ferrous sulfate 325 (65 FE) MG tablet TAKE 1 TABLET BY MOUTH 2 TIMES DAILY WITH A MEAL  . levothyroxine (SYNTHROID, LEVOTHROID) 150 MCG tablet Take 1 tablet (150 mcg total) daily before breakfast by mouth. PATIENT MUST HAVE REPEAT LABS PRIOR TO ANY FURTHER REFILLS  . magnesium 30 MG tablet Take 30 mg by mouth daily.  Marland Kitchen MATZIM LA 360 MG 24 hr tablet TAKE 1 TABLET (360 MG TOTAL) BY MOUTH DAILY. (FUTURE RF'S PER CARDS- DR HILTY)  . omega-3 acid ethyl esters (LOVAZA) 1 g capsule Take 2 capsules (2 g total) by mouth 2 (two) times daily.  . Potassium 75 MG TABS Take 1 tablet daily by mouth.   . Vitamin D, Ergocalciferol, (DRISDOL) 50000 units CAPS capsule Take 1 capsule (50,000 Units total) by mouth once a week.  . [DISCONTINUED] ALPRAZolam (XANAX) 0.5 MG tablet Take 1 tablet (0.5 mg total) by mouth 2 (two) times daily as needed for anxiety.  . [DISCONTINUED] amoxicillin-clavulanate (AUGMENTIN) 875-125 MG tablet Take 1 tablet by mouth 2 (two) times daily.  . [DISCONTINUED] FLUoxetine (PROZAC) 20 MG tablet Take 1 tablet (20 mg total) by mouth daily.  . cyclobenzaprine (FLEXERIL) 10 MG tablet Take 1 tablet (10 mg total) by mouth at bedtime as needed for muscle spasms. (Patient not taking: Reported on 02/13/2017)   No facility-administered encounter medications on file as of 02/13/2017.     Allergies: Ace inhibitors and Lisinopril  Body mass index is 36.94 kg/m.  Blood pressure 135/85, pulse 86, height 5'  4" (1.626 m), weight 215 lb 3.2 oz (97.6 kg).     Review of Systems  Constitutional: Negative for activity change, appetite change, chills, diaphoresis, fever and unexpected weight change.  HENT: Negative for congestion.   Eyes: Negative for visual disturbance.  Respiratory: Negative for cough, chest tightness, shortness of breath and wheezing.   Cardiovascular: Negative for chest pain, palpitations and leg swelling.  Gastrointestinal: Negative for abdominal distention, abdominal pain, blood in stool, constipation, diarrhea, nausea  and vomiting.  Endocrine: Negative for cold intolerance, heat intolerance, polydipsia, polyphagia and polyuria.  Genitourinary: Negative for difficulty urinating and flank pain.  Musculoskeletal: Positive for arthralgias, back pain, gait problem, joint swelling, myalgias, neck pain and neck stiffness.  Skin: Negative for color change, pallor and rash.  Allergic/Immunologic: Negative for immunocompromised state.  Neurological: Negative for dizziness, tremors, weakness and headaches.  Psychiatric/Behavioral: Positive for dysphoric mood and sleep disturbance. Negative for agitation, behavioral problems, confusion, decreased concentration, hallucinations, self-injury and suicidal ideas. The patient is nervous/anxious. The patient is not hyperactive.        Objective:   Physical Exam  Constitutional: She is oriented to person, place, and time. She appears well-developed and well-nourished. No distress.  HENT:  Head: Normocephalic and atraumatic.  Right Ear: External ear normal.  Left Ear: External ear normal.  Eyes: Conjunctivae are normal. Pupils are equal, round, and reactive to light.  Neck: Normal range of motion. Neck supple.  Cardiovascular: Normal rate, regular rhythm, normal heart sounds and intact distal pulses.  No murmur heard. Pulmonary/Chest: Effort normal and breath sounds normal. No respiratory distress. She has no wheezes. She has no rales. She  exhibits no tenderness.  Lymphadenopathy:    She has no cervical adenopathy.  Neurological: She is alert and oriented to person, place, and time. Coordination normal.  Skin: Skin is warm. No rash noted. She is not diaphoretic. No erythema. No pallor.  Psychiatric: Her speech is normal and behavior is normal. Judgment and thought content normal. Her mood appears not anxious. Cognition and memory are normal.  Nursing note and vitals reviewed.         Assessment & Plan:   1. Other specified hypothyroidism   2. GAD (generalized anxiety disorder)     GAD (generalized anxiety disorder) Remain off Fluoxetine 74m daily- has been off for >3 weeks If GAD increases, then we will re-start Fluoxetine is needed.    Hypothyroidism Last TSH 0.258 11/14/16 Levothyroxine 213mcg decreased to 156mcg She reports reduction in fatigue and improved. Will cal when thyroid level results are available.

## 2017-02-13 NOTE — Patient Instructions (Addendum)

## 2017-02-13 NOTE — Telephone Encounter (Signed)
Per Valetta Fuller, refill sent for #[phone] only.  All future refills to be addressed by Dr. Debara Pickett.  LVM informing pt of this information.  Charyl Bigger, CMA

## 2017-02-13 NOTE — Assessment & Plan Note (Signed)
Last TSH 0.258 11/14/16 Levothyroxine 275mcg decreased to 131mcg She reports reduction in fatigue and improved. Will cal when thyroid level results are available.

## 2017-02-13 NOTE — Assessment & Plan Note (Addendum)
Remain off Fluoxetine 82m daily- has been off for >3 weeks If GAD increases, then we will re-start Fluoxetine is needed.

## 2017-02-13 NOTE — Telephone Encounter (Signed)
Patient called stated pharmacy does not have the Rx refill auth for Matzim LA 360 MG 24 hr tablets--Pt states thyroid Rx was rcvd but pharmacist says nothing for the BP meds Matzim. --glh

## 2017-02-14 ENCOUNTER — Telehealth: Payer: Self-pay | Admitting: Adult Health

## 2017-02-14 ENCOUNTER — Other Ambulatory Visit: Payer: Self-pay | Admitting: Adult Health

## 2017-02-14 DIAGNOSIS — E039 Hypothyroidism, unspecified: Secondary | ICD-10-CM

## 2017-02-14 LAB — TSH: TSH: 12.65 u[IU]/mL — ABNORMAL HIGH (ref 0.450–4.500)

## 2017-02-14 MED ORDER — LEVOTHYROXINE SODIUM 175 MCG PO TABS: 175.0000 ug | ORAL_TABLET | Freq: Every day | ORAL | 0 refills | Status: DC

## 2017-02-14 NOTE — Telephone Encounter (Signed)
Patient called Friday afternoon about test results

## 2017-02-18 NOTE — Telephone Encounter (Signed)
See results note.  Charyl Bigger, CMA

## 2017-03-18 NOTE — Progress Notes (Deleted)
Subjective:    Patient ID: Kayla [name], female    DOB: 10-Feb-1946, 71 y.o.   MRN: [id number]  HPI:  11/14/2016 OV:  Kayla [name] presents with increased anxiety r/t to financial struggles.  She has hx of depression, anxiety, and fibromyalgia.  She also reports "body shaking, mostly from my waist up" that has been intermittent for the last 3 months.  She reported that this occurred 7 years ago and it was related to hypokalemia.  She denies tobacco/EOTH use and drinks at least 60 ounces water and fruit juices day.  She was provided alprazolam rx last week which she reports she lost.  She denies CP/dyspnea at rest/palpitations.  Her last TSH was 01/02/17- 4.50, she is taking levothyroxine 245mcg daily. She was previously on Prozac, tolerated it well without SE and good sx control.  02/13/2017 OV: Kayla [name] is here for f/u: anxiety and thyroid level lab draw.  She has not taken  Fluoxetine in >3 weeks and reports sig decrease in anxiety.  She denies CP/dyspnea/palpitations. Her levothyroxine was reduced from 200 to 160mcg daily in August (11/14/16 TSH 0.258), which may have helped with improvement of mood. She estimates to drink 40 oz water/day and been trying to reduce CHO/sugar/satruated fat intake- she has >3 lbs since last OV in August.  03/20/17 OV: Kayla [name] is here for CPE Healthcare Maintenance: PAP-not indicated Mammogram Colonoscopy   Patient Care Team    Relationship Specialty Notifications Start End  Mina Marble D, NP PCP - General Family Medicine  11/14/16   Allyn Kenner, MD  Dermatology  12/20/15   Pixie Casino, MD Consulting Physician Cardiology  01/01/16   Lahoma Rocker, MD Referring Physician Rheumatology  01/01/16    Comment: fibromyalgia/  and osteopania and scolisois    Patient Active Problem List   Diagnosis Date Noted  . Shakiness 11/14/2016  . Otitis media of left ear 09/11/2016  . Depression, recurrent ([place]) 07/16/2016  . Adjustment disorder with mixed anxiety and depressed  mood 01/15/2016  . Fe Def Anemia 12/22/2015  . Scoliosis (and kyphoscoliosis), idiopathic 12/20/2015  . Osteopenia 12/20/2015  . PCP NOTES >>> 01/22/2015  . Fibromyalgia 08/02/2014  . Chest pain 07/07/2014  . DOE (dyspnea on exertion)- chronic 07/07/2014  . GAD (generalized anxiety disorder) 09/17/2012  . UTI (lower urinary tract infection) 01/24/2012  . Vitamin D deficiency 01/24/2012  . Insomnia 01/24/2012  . Fatigue 10/14/2011  . Neck pain 07/23/2010  . Medicare annual wellness visit, initial 07/23/2010  . B12 deficiency   . Hyperlipidemia 11/30/2007  . CAD (coronary artery disease) 11/30/2007  . DYSPNEA 11/30/2007  . PALPITATIONS, OCCASIONAL 04/15/2007  . Hypothyroidism 01/27/2007  . Essential hypertension 01/27/2007  . Reactive airway disease that is not asthma 01/27/2007  . GERD 01/27/2007  . IBS 01/27/2007  . BACK PAIN, CHRONIC 01/27/2007     Past Medical History:  Diagnosis Date  . Anemia   . Asthma   . B12 deficiency   . Chronic LBP   . Diastolic dysfunction   . Fibromyalgia   . GERD (gastroesophageal reflux disease)   . Hyperlipemia   . Hypertension   . Hypothyroid   . IBS (irritable bowel syndrome)   . S/P cardiac cath 11/30/2007   normal coronaries - Dr. Burt Knack (Dr. Debara Pickett reviewed films on 01/10/2016)     Past Surgical History:  Procedure Laterality Date  . CESAREAN SECTION    . CHOLECYSTECTOMY    . [place]  Family History  Problem Relation Age of Onset  . Heart attack Father 64  . Lung cancer Unknown        uncle, non smoker  . Leukemia Unknown        uncle  . Cancer Mother        oral  . Stroke Mother   . Cancer Maternal Aunt        breast  . Alcohol abuse Maternal Aunt   . Alcohol abuse Maternal Uncle   . Cancer Maternal Uncle        lung, stomach, oral  . Cancer Paternal Uncle        lung  . Congestive Heart Failure Maternal Grandfather   . Heart attack Paternal Grandfather   . Cancer Paternal Uncle        bone   . Colon cancer Neg Hx   . Breast cancer Neg Hx   . Diabetes Neg Hx      Social History   Substance and Sexual Activity  Drug Use No     Social History   Substance and Sexual Activity  Alcohol Use No     Social History   Tobacco Use  Smoking Status Never Smoker  Smokeless Tobacco Never Used     Outpatient Encounter Medications as of 03/20/2017  Medication Sig  . aspirin 81 MG tablet Take 81 mg by mouth daily.  . [employer] VITAMIN B12 1000 MCG tablet TAKE 1 TABLET (1,000 MCG TOTAL) BY MOUTH DAILY.  Marland Kitchen Cyanocobalamin (VITAMIN B 12 PO) Take 1 tablet by mouth daily.  . cyclobenzaprine (FLEXERIL) 10 MG tablet Take 1 tablet (10 mg total) by mouth at bedtime as needed for muscle spasms. (Patient not taking: Reported on 02/13/2017)  . diltiazem (MATZIM LA) 360 MG 24 hr tablet Take 1 tablet (360 mg total) daily by mouth. (Future RF's per Cards- Dr Debara Pickett)  . ferrous sulfate 325 (65 FE) MG tablet TAKE 1 TABLET BY MOUTH 2 TIMES DAILY WITH A MEAL  . levothyroxine (SYNTHROID, LEVOTHROID) 175 MCG tablet Take 1 tablet (175 mcg total) daily before breakfast by mouth.  . magnesium 30 MG tablet Take 30 mg by mouth daily.  Marland Kitchen omega-3 acid ethyl esters (LOVAZA) 1 g capsule Take 2 capsules (2 g total) by mouth 2 (two) times daily.  . Potassium 75 MG TABS Take 1 tablet daily by mouth.   . Vitamin D, Ergocalciferol, (DRISDOL) 50000 units CAPS capsule Take 1 capsule (50,000 Units total) by mouth once a week.   No facility-administered encounter medications on file as of 03/20/2017.     Allergies: Ace inhibitors and Lisinopril  There is no height or weight on file to calculate BMI.  There were no vitals taken for this visit.     Review of Systems  Constitutional: Negative for activity change, appetite change, chills, diaphoresis, fever and unexpected weight change.  HENT: Negative for congestion.   Eyes: Negative for visual disturbance.  Respiratory: Negative for cough, chest tightness,  shortness of breath and wheezing.   Cardiovascular: Negative for chest pain, palpitations and leg swelling.  Gastrointestinal: Negative for abdominal distention, abdominal pain, blood in stool, constipation, diarrhea, nausea and vomiting.  Endocrine: Negative for cold intolerance, heat intolerance, polydipsia, polyphagia and polyuria.  Genitourinary: Negative for difficulty urinating and flank pain.  Musculoskeletal: Positive for arthralgias, back pain, gait problem, joint swelling, myalgias, neck pain and neck stiffness.  Skin: Negative for color change, pallor and rash.  Allergic/Immunologic: Negative for immunocompromised state.  Neurological: Negative  for dizziness, tremors, weakness and headaches.  Psychiatric/Behavioral: Positive for dysphoric mood and sleep disturbance. Negative for agitation, behavioral problems, confusion, decreased concentration, hallucinations, self-injury and suicidal ideas. The patient is nervous/anxious. The patient is not hyperactive.        Objective:   Physical Exam  Constitutional: She is oriented to person, place, and time. She appears well-developed and well-nourished. No distress.  HENT:  Head: Normocephalic and atraumatic.  Right Ear: External ear normal.  Left Ear: External ear normal.  Eyes: Conjunctivae are normal. Pupils are equal, round, and reactive to light.  Neck: Normal range of motion. Neck supple.  Cardiovascular: Normal rate, regular rhythm, normal heart sounds and intact distal pulses.  No murmur heard. Pulmonary/Chest: Effort normal and breath sounds normal. No respiratory distress. She has no wheezes. She has no rales. She exhibits no tenderness.  Lymphadenopathy:    She has no cervical adenopathy.  Neurological: She is alert and oriented to person, place, and time. Coordination normal.  Skin: Skin is warm. No rash noted. She is not diaphoretic. No erythema. No pallor.  Psychiatric: Her speech is normal and behavior is normal.  Judgment and thought content normal. Her mood appears not anxious. Cognition and memory are normal.  Nursing note and vitals reviewed.         Assessment & Plan:   No diagnosis found.  No problem-specific Assessment & Plan notes found for this encounter.

## 2017-03-19 ENCOUNTER — Other Ambulatory Visit: Payer: Self-pay | Admitting: Adult Health

## 2017-03-20 ENCOUNTER — Encounter: Payer: Medicare Other | Admitting: Adult Health

## 2017-04-05 ENCOUNTER — Other Ambulatory Visit: Payer: Self-pay | Admitting: Family Medicine

## 2017-04-14 ENCOUNTER — Other Ambulatory Visit: Payer: Self-pay | Admitting: Adult Health

## 2017-04-21 ENCOUNTER — Other Ambulatory Visit: Payer: Self-pay

## 2017-04-25 ENCOUNTER — Other Ambulatory Visit: Payer: Self-pay

## 2017-04-25 ENCOUNTER — Other Ambulatory Visit (INDEPENDENT_AMBULATORY_CARE_PROVIDER_SITE_OTHER): Payer: Medicare Other

## 2017-04-25 DIAGNOSIS — E559 Vitamin D deficiency, unspecified: Secondary | ICD-10-CM

## 2017-04-25 DIAGNOSIS — E038 Other specified hypothyroidism: Secondary | ICD-10-CM

## 2017-04-25 DIAGNOSIS — I10 Essential (primary) hypertension: Secondary | ICD-10-CM

## 2017-04-25 DIAGNOSIS — E039 Hypothyroidism, unspecified: Secondary | ICD-10-CM

## 2017-04-25 DIAGNOSIS — R739 Hyperglycemia, unspecified: Secondary | ICD-10-CM

## 2017-04-25 DIAGNOSIS — R5383 Other fatigue: Secondary | ICD-10-CM

## 2017-04-25 DIAGNOSIS — D508 Other iron deficiency anemias: Secondary | ICD-10-CM

## 2017-04-25 DIAGNOSIS — E785 Hyperlipidemia, unspecified: Secondary | ICD-10-CM

## 2017-04-25 DIAGNOSIS — E538 Deficiency of other specified B group vitamins: Secondary | ICD-10-CM

## 2017-04-26 LAB — COMPREHENSIVE METABOLIC PANEL
ALT: 9 IU/L (ref 0–32)
AST: 15 IU/L (ref 0–40)
Albumin/Globulin Ratio: 1.7 (ref 1.2–2.2)
Albumin: 4.3 g/dL (ref 3.5–4.8)
Alkaline Phosphatase: 116 IU/L (ref 39–117)
BUN/Creatinine Ratio: 10 — ABNORMAL LOW (ref 12–28)
BUN: 10 mg/dL (ref 8–27)
Bilirubin Total: 0.2 mg/dL (ref 0.0–1.2)
CO2: 20 mmol/L (ref 20–29)
Calcium: 10.2 mg/dL (ref 8.7–10.3)
Chloride: 108 mmol/L — ABNORMAL HIGH (ref 96–106)
Creatinine, Ser: 0.97 mg/dL (ref 0.57–1.00)
GFR calc Af Amer: 68 mL/min/{1.73_m2} (ref >59)
GFR calc non Af Amer: 59 mL/min/{1.73_m2} — ABNORMAL LOW (ref >59)
Globulin, Total: 2.6 g/dL (ref 1.5–4.5)
Glucose: 105 mg/dL — ABNORMAL HIGH (ref 65–99)
Potassium: 4.3 mmol/L (ref 3.5–5.2)
Sodium: 147 mmol/L — ABNORMAL HIGH (ref 134–144)
Total Protein: 6.9 g/dL (ref 6.0–8.5)

## 2017-04-26 LAB — CBC WITH DIFFERENTIAL/PLATELET
Basophils Absolute: 0 10*3/uL (ref 0.0–0.2)
Basos: 0 %
EOS (ABSOLUTE): 0.2 10*3/uL (ref 0.0–0.4)
Eos: 2 %
Hematocrit: 43.6 % (ref 34.0–46.6)
Hemoglobin: 14.6 g/dL (ref 11.1–15.9)
Immature Grans (Abs): 0 10*3/uL (ref 0.0–0.1)
Immature Granulocytes: 0 %
Lymphocytes Absolute: 3.4 10*3/uL — ABNORMAL HIGH (ref 0.7–3.1)
Lymphs: 32 %
MCH: 31.5 pg (ref 26.6–33.0)
MCHC: 33.5 g/dL (ref 31.5–35.7)
MCV: 94 fL (ref 79–97)
Monocytes Absolute: 0.8 10*3/uL (ref 0.1–0.9)
Monocytes: 7 %
Neutrophils Absolute: 6.2 10*3/uL (ref 1.4–7.0)
Neutrophils: 59 %
Platelets: 374 10*3/uL (ref 150–379)
RBC: 4.63 x10E6/uL (ref 3.77–5.28)
RDW: 13.5 % (ref 12.3–15.4)
WBC: 10.7 10*3/uL (ref 3.4–10.8)

## 2017-04-26 LAB — TSH: TSH: 51.78 u[IU]/mL — ABNORMAL HIGH (ref 0.450–4.500)

## 2017-04-26 LAB — LIPID PANEL
Chol/HDL Ratio: 4.6 ratio — ABNORMAL HIGH (ref 0.0–4.4)
Cholesterol, Total: 217 mg/dL — ABNORMAL HIGH (ref 100–199)
HDL: 47 mg/dL (ref >39)
LDL Calculated: 137 mg/dL — ABNORMAL HIGH (ref 0–99)
Triglycerides: 166 mg/dL — ABNORMAL HIGH (ref 0–149)
VLDL Cholesterol Cal: 33 mg/dL (ref 5–40)

## 2017-04-26 LAB — HEMOGLOBIN A1C
Est. average glucose Bld gHb Est-mCnc: 114 mg/dL
Hgb A1c MFr Bld: 5.6 % (ref 4.8–5.6)

## 2017-04-26 LAB — VITAMIN B12: Vitamin B-12: 560 pg/mL (ref 232–1245)

## 2017-04-26 LAB — VITAMIN D 25 HYDROXY (VIT D DEFICIENCY, FRACTURES): Vit D, 25-Hydroxy: 32.5 ng/mL (ref 30.0–100.0)

## 2017-04-28 ENCOUNTER — Other Ambulatory Visit: Payer: Self-pay | Admitting: Adult Health

## 2017-04-28 ENCOUNTER — Ambulatory Visit (INDEPENDENT_AMBULATORY_CARE_PROVIDER_SITE_OTHER): Payer: Medicare Other | Admitting: Adult Health

## 2017-04-28 ENCOUNTER — Encounter: Payer: Self-pay | Admitting: Adult Health

## 2017-04-28 VITALS — BP 148/88 | HR 65 | Ht 64.0 in | Wt 215.9 lb

## 2017-04-28 DIAGNOSIS — Z1239 Encounter for other screening for malignant neoplasm of breast: Secondary | ICD-10-CM

## 2017-04-28 DIAGNOSIS — D508 Other iron deficiency anemias: Secondary | ICD-10-CM | POA: Diagnosis not present

## 2017-04-28 DIAGNOSIS — Z1211 Encounter for screening for malignant neoplasm of colon: Secondary | ICD-10-CM

## 2017-04-28 DIAGNOSIS — Z1231 Encounter for screening mammogram for malignant neoplasm of breast: Secondary | ICD-10-CM

## 2017-04-28 DIAGNOSIS — E7800 Pure hypercholesterolemia, unspecified: Secondary | ICD-10-CM | POA: Diagnosis not present

## 2017-04-28 DIAGNOSIS — R599 Enlarged lymph nodes, unspecified: Secondary | ICD-10-CM

## 2017-04-28 DIAGNOSIS — E039 Hypothyroidism, unspecified: Secondary | ICD-10-CM | POA: Diagnosis not present

## 2017-04-28 DIAGNOSIS — Z0000 Encounter for general adult medical examination without abnormal findings: Secondary | ICD-10-CM | POA: Diagnosis not present

## 2017-04-28 DIAGNOSIS — J989 Respiratory disorder, unspecified: Secondary | ICD-10-CM

## 2017-04-28 DIAGNOSIS — M797 Fibromyalgia: Secondary | ICD-10-CM

## 2017-04-28 DIAGNOSIS — I10 Essential (primary) hypertension: Secondary | ICD-10-CM | POA: Diagnosis not present

## 2017-04-28 DIAGNOSIS — R319 Hematuria, unspecified: Secondary | ICD-10-CM

## 2017-04-28 DIAGNOSIS — D229 Melanocytic nevi, unspecified: Secondary | ICD-10-CM | POA: Diagnosis not present

## 2017-04-28 DIAGNOSIS — R0989 Other specified symptoms and signs involving the circulatory and respiratory systems: Secondary | ICD-10-CM | POA: Diagnosis not present

## 2017-04-28 DIAGNOSIS — E785 Hyperlipidemia, unspecified: Secondary | ICD-10-CM

## 2017-04-28 LAB — POCT URINALYSIS DIPSTICK
Glucose, UA: NEGATIVE
Ketones, UA: 15
Leukocytes, UA: NEGATIVE
Nitrite, UA: NEGATIVE
Protein, UA: 30
Spec Grav, UA: >=1.03 — AB (ref 1.010–1.025)
Urobilinogen, UA: 1 E.U./dL
pH, UA: 5.5 (ref 5.0–8.0)

## 2017-04-28 MED ORDER — DULOXETINE HCL 30 MG PO CPEP: ORAL_CAPSULE | ORAL | 0 refills | Status: DC

## 2017-04-28 MED ORDER — LEVOTHYROXINE SODIUM 200 MCG PO TABS: 200.0000 ug | ORAL_TABLET | Freq: Every day | ORAL | 0 refills | Status: DC

## 2017-04-28 NOTE — Assessment & Plan Note (Signed)
04/25/17 TSH- 51.780 Discussed at length how to correctly take Levothyroxine. Increased dosage from 147mcg to 213mcg Return in 6 weeks for thyroid panel

## 2017-04-28 NOTE — Progress Notes (Addendum)
Subjective:    Patient ID: Kayla [name], female    DOB: January 08, 1946, 72 y.o.   MRN: [id number]  HPI:  11/14/2016 OV:  [name] presents with increased anxiety r/t to financial struggles.  She has hx of depression, anxiety, and fibromyalgia.  She also reports "body shaking, mostly from my waist up" that has been intermittent for the last 3 months.  She reported that this occurred 7 years ago and it was related to hypokalemia.  She denies tobacco/EOTH use and drinks at least 60 ounces water and fruit juices day.  She was provided alprazolam rx last week which she reports she lost.  She denies CP/dyspnea at rest/palpitations.  Her last TSH was 01/02/17- 4.50, she is taking levothyroxine 255mcg daily. She was previously on Prozac, tolerated it well without SE and good sx control.  02/13/2017 OV: [name] is here for f/u: anxiety and thyroid level lab draw.  She has not taken  Fluoxetine in >3 weeks and reports sig decrease in anxiety.  She denies CP/dyspnea/palpitations. Her levothyroxine was reduced from 200 to 169mcg daily in August (11/14/16 TSH 0.258), which may have helped with improvement of mood. She estimates to drink 40 oz water/day and been trying to reduce CHO/sugar/satruated fat intake- she has >3 lbs since last OV in August.  04/28/17 OV: [name] is here for CPE.  She reports taking her Levothyroxine 184mcg at night on full stomach.  We discussed at length that Levothyroxine needs to be taken on empty stomach 30-59mins with water.  She denies CP/palpitations.  Dyspnea with exertion has not increased in frequency/seerity.  She reports "tingling in my arms/legs" and a "constant ache from this fibromyalgia. She denies tobacco/ETOH use. She estimates to drink several bottle of water/day. She denies regular exercise, however cares for her 25 year old grandson that has severe ADHD-which requires a lot of cleaning and "running around".  Healthcare Maintenance: PAP- not indicated Mammogram- due,  ordered Colonoscopy- due, ordered DEXA- overdue, declined today.  Patient Care Team    Relationship Specialty Notifications Start End  Mina Marble D, NP PCP - General Family Medicine  11/14/16   Allyn Kenner, MD  Dermatology  12/20/15   Pixie Casino, MD Consulting Physician Cardiology  01/01/16   Lahoma Rocker, MD Referring Physician Rheumatology  01/01/16    Comment: fibromyalgia/  and osteopania and scolisois    Patient Active Problem List   Diagnosis Date Noted  . Hematuria 04/28/2017  . Multiple atypical skin moles 04/28/2017  . Swollen lymph nodes 04/28/2017  . Screening for colon cancer 04/28/2017  . Healthcare maintenance 04/28/2017  . Shakiness 11/14/2016  . Otitis media of left ear 09/11/2016  . Depression, recurrent ([place]) 07/16/2016  . Adjustment disorder with mixed anxiety and depressed mood 01/15/2016  . Fe Def Anemia 12/22/2015  . Scoliosis (and kyphoscoliosis), idiopathic 12/20/2015  . Osteopenia 12/20/2015  . PCP NOTES >>> 01/22/2015  . Fibromyalgia 08/02/2014  . Chest pain 07/07/2014  . DOE (dyspnea on exertion)- chronic 07/07/2014  . GAD (generalized anxiety disorder) 09/17/2012  . UTI (lower urinary tract infection) 01/24/2012  . Vitamin D deficiency 01/24/2012  . Insomnia 01/24/2012  . Fatigue 10/14/2011  . Neck pain 07/23/2010  . Screening for breast cancer 07/23/2010  . B12 deficiency   . Hyperlipidemia 11/30/2007  . CAD (coronary artery disease) 11/30/2007  . DYSPNEA 11/30/2007  . PALPITATIONS, OCCASIONAL 04/15/2007  . Hypothyroidism 01/27/2007  . Essential hypertension 01/27/2007  . Reactive airway disease that is not  asthma 01/27/2007  . GERD 01/27/2007  . IBS 01/27/2007  . BACK PAIN, CHRONIC 01/27/2007     Past Medical History:  Diagnosis Date  . Anemia   . Asthma   . B12 deficiency   . Chronic LBP   . Diastolic dysfunction   . Fibromyalgia   . GERD (gastroesophageal reflux disease)   . Hyperlipemia   . Hypertension   .  Hypothyroid   . IBS (irritable bowel syndrome)   . S/P cardiac cath 11/30/2007   normal coronaries - Dr. Burt Knack (Dr. Debara Pickett reviewed films on 01/10/2016)     Past Surgical History:  Procedure Laterality Date  . CESAREAN SECTION    . CHOLECYSTECTOMY    . NECK SURGERY  1998     Family History  Problem Relation Age of Onset  . Heart attack Father 51  . Lung cancer Unknown        uncle, non smoker  . Leukemia Unknown        uncle  . Cancer Mother        oral  . Stroke Mother   . Cancer Maternal Aunt        breast  . Alcohol abuse Maternal Aunt   . Alcohol abuse Maternal Uncle   . Cancer Maternal Uncle        lung, stomach, oral  . Cancer Paternal Uncle        lung  . Congestive Heart Failure Maternal Grandfather   . Heart attack Paternal Grandfather   . Cancer Paternal Uncle        bone  . Colon cancer Neg Hx   . Breast cancer Neg Hx   . Diabetes Neg Hx      Social History   Substance and Sexual Activity  Drug Use No     Social History   Substance and Sexual Activity  Alcohol Use No     Social History   Tobacco Use  Smoking Status Never Smoker  Smokeless Tobacco Never Used     Outpatient Encounter Medications as of 04/28/2017  Medication Sig  . aspirin 81 MG tablet Take 81 mg by mouth daily.  . [employer]S VITAMIN B12 1000 MCG tablet TAKE 1 TABLET (1,000 MCG TOTAL) BY MOUTH DAILY.  Marland Kitchen Cyanocobalamin (VITAMIN B 12 PO) Take 1 tablet by mouth daily.  . cyclobenzaprine (FLEXERIL) 10 MG tablet Take 1 tablet (10 mg total) by mouth at bedtime as needed for muscle spasms.  Marland Kitchen diltiazem (MATZIM LA) 360 MG 24 hr tablet Take 1 tablet (360 mg total) daily by mouth. (Future RF's per Cards- Dr Debara Pickett)  . levothyroxine (SYNTHROID, LEVOTHROID) 200 MCG tablet Take 1 tablet (200 mcg total) by mouth daily before breakfast. PATIENT MUST HAVE OFFICE VISIT PRIOR TO ANY FURTHER REFILLS  . magnesium 30 MG tablet Take 30 mg by mouth daily.  Marland Kitchen omega-3 acid ethyl esters (LOVAZA) 1 g  capsule Take 2 capsules (2 g total) by mouth 2 (two) times daily.  . Potassium 75 MG TABS Take 1 tablet daily by mouth.   . Vitamin D, Ergocalciferol, (DRISDOL) 50000 units CAPS capsule Take 1 capsule (50,000 Units total) by mouth once a week.  . [DISCONTINUED] ferrous sulfate 325 (65 FE) MG tablet TAKE 1 TABLET BY MOUTH 2 TIMES DAILY WITH A MEAL  . [DISCONTINUED] levothyroxine (SYNTHROID, LEVOTHROID) 175 MCG tablet Take 1 tablet (175 mcg total) by mouth daily before breakfast. PATIENT MUST HAVE OFFICE VISIT PRIOR TO ANY FURTHER REFILLS  . DULoxetine (CYMBALTA) 30  MG capsule 1 tab daily for week one, then increase to one tab every 12 hrs week two   No facility-administered encounter medications on file as of 04/28/2017.     Allergies: Ace inhibitors and Lisinopril  Body mass index is 37.06 kg/m.  Blood pressure (!) 148/88, pulse 65, height 5\' 4"  (1.626 m), weight 215 lb 14.4 oz (97.9 kg), SpO2 99 %.  Review of Systems  Constitutional: Negative for activity change, appetite change, chills, diaphoresis, fever and unexpected weight change.  HENT: Negative for congestion.   Eyes: Negative for visual disturbance.  Respiratory: Negative for cough, chest tightness, shortness of breath and wheezing.   Cardiovascular: Negative for chest pain, palpitations and leg swelling.  Gastrointestinal: Negative for abdominal distention, abdominal pain, blood in stool, constipation, diarrhea, nausea and vomiting.  Endocrine: Negative for cold intolerance, heat intolerance, polydipsia, polyphagia and polyuria.  Genitourinary: Negative for difficulty urinating and flank pain.  Musculoskeletal: Positive for arthralgias, back pain, gait problem, joint swelling, myalgias, neck pain and neck stiffness.  Skin: Negative for color change, pallor and rash.  Allergic/Immunologic: Negative for immunocompromised state.  Neurological: Negative for dizziness, tremors, weakness and headaches.  Psychiatric/Behavioral:  Positive for dysphoric mood and sleep disturbance. Negative for agitation, behavioral problems, confusion, decreased concentration, hallucinations, self-injury and suicidal ideas. The patient is nervous/anxious. The patient is not hyperactive.        Objective:   Physical Exam  Constitutional: She is oriented to person, place, and time. She appears well-developed and well-nourished. No distress.  HENT:  Head: Normocephalic and atraumatic.  Right Ear: External ear normal.  Left Ear: External ear normal.  Eyes: Conjunctivae are normal. Pupils are equal, round, and reactive to light.  Neck: Normal range of motion. Neck supple. No tracheal deviation present.    One, round, mobile, discrete mass. No tenderness with palpation.  Cardiovascular: Normal rate, regular rhythm, normal heart sounds and intact distal pulses.  No murmur heard. Pulmonary/Chest: Effort normal and breath sounds normal. No respiratory distress. She has no decreased breath sounds. She has no wheezes. She has no rhonchi. She has no rales. She exhibits no tenderness. Right breast exhibits no inverted nipple, no mass, no nipple discharge, no skin change and no tenderness. Left breast exhibits no inverted nipple, no mass, no nipple discharge and no tenderness.  Abdominal: Soft. Bowel sounds are normal. She exhibits no distension and no mass. There is no tenderness. There is no rebound and no guarding.  Musculoskeletal: She exhibits edema and tenderness.       Right ankle: She exhibits swelling.       Left ankle: She exhibits swelling.       Lumbar back: She exhibits tenderness.  Lymphadenopathy:    She has cervical adenopathy.  Neurological: She is alert and oriented to person, place, and time. Coordination normal.  Skin: Skin is warm. No rash noted. She is not diaphoretic. No erythema. No pallor.  Psychiatric: Her speech is normal and behavior is normal. Judgment and thought content normal. Her mood appears anxious. Cognition  and memory are normal.  Nursing note and vitals reviewed.         Assessment & Plan:   1. Screening for breast cancer   2. Screening for colon cancer   3. Swollen lymph nodes   4. Hematuria, unspecified type   5. Hypothyroidism, unspecified type   6. Multiple atypical skin moles   7. Essential hypertension   8. Reactive airway disease that is not asthma   9. Fibromyalgia  10. Healthcare maintenance   11. Other iron deficiency anemia   12. Elevated cholesterol   13. Hyperlipidemia, unspecified hyperlipidemia type     Hematuria UA neg  Essential hypertension BP slightly elevated 148/88, HR 65 Continue Diltiazem 360mg  daily Denies current cardiac sx's  Reactive airway disease that is not asthma Stable O2 sat on RA 99%  Multiple atypical skin moles Dermatology referral placed Dr. Rinaldo Ratel [phone]  Fibromyalgia Please start Cymbalta 30mg  once daily for week one, then increase to twice daily. Return in 6 weeks for thyroid panel and determine is Cymbalta is helping with Fibromyalgia pain.   Hypothyroidism 04/25/17 TSH- 51.780 Discussed at length how to correctly take Levothyroxine. Increased dosage from 132mcg to 258mcg Return in 6 weeks for thyroid panel  Healthcare maintenance Please continue all medications as directed. Please start Cymbalta 30mg  once daily for week one, then increase to twice daily. Levothyroxine increased to 267mcg daily- take first thing in am on empty stomach with water only. Mammogram an Colonoscopy orders placed. Follow-up with cardiologist as directed. Return in 6 weeks for thyroid panel and determine is Cymbalta is helping with Fibromyalgia pain.   Swollen lymph nodes L cervical lymphadenopathy that has slowly been increasing in size the last few months. Not tender with palpation.  Fe Def Anemia 04/25/17 H/H/ 14/43 Rx strength ferrous sulfate d/c'd- continue OTC iron supplement.  Hyperlipidemia 4d ago  04/25/17   Cholesterol, Total 100 - 199 mg/dL 217 Abnormally high    Triglycerides 0 - 149 mg/dL 166 Abnormally high    HDL >39 mg/dL 47   VLDL Cholesterol Cal 5 - 40 mg/dL 33   LDL Calculated 0 - 99 mg/dL 137 Abnormally high    Chol/HDL Ratio 0.0 - 4.4 ratio 4.6 Abnormally high     Refuses statin therapy She will try to better follow heart healthy diet, increase regular movement and take Lovaza as directed- 2 tabs BID, she was only taking 1 tab BID Will re-check lipids in 6 months  Pt was in the office today for 40+ minutes, with over 50% time spent in face to face counseling of various medical concerns and in coordination of care Follow-up in 6 weeks -labs/fibromyalgia

## 2017-04-28 NOTE — Assessment & Plan Note (Signed)
Please continue all medications as directed. Please start Cymbalta 30mg  once daily for week one, then increase to twice daily. Levothyroxine increased to 227mcg daily- take first thing in am on empty stomach with water only. Mammogram an Colonoscopy orders placed. Follow-up with cardiologist as directed. Return in 6 weeks for thyroid panel and determine is Cymbalta is helping with Fibromyalgia pain.

## 2017-04-28 NOTE — Assessment & Plan Note (Signed)
Dermatology referral placed Dr. Rinaldo Ratel [phone]

## 2017-04-28 NOTE — Assessment & Plan Note (Signed)
BP slightly elevated 148/88, HR 65 Continue Diltiazem 360mg  daily Denies current cardiac sx's

## 2017-04-28 NOTE — Assessment & Plan Note (Signed)
UA (neg)

## 2017-04-28 NOTE — Assessment & Plan Note (Signed)
Please start Cymbalta 30mg  once daily for week one, then increase to twice daily. Return in 6 weeks for thyroid panel and determine is Cymbalta is helping with Fibromyalgia pain.

## 2017-04-28 NOTE — Assessment & Plan Note (Signed)
Stable O2 sat on RA 99%

## 2017-04-28 NOTE — Patient Instructions (Addendum)
Heart-Healthy Eating Plan Many factors influence your heart health, including eating and exercise habits. Heart (coronary) risk increases with abnormal blood fat (lipid) levels. Heart-healthy meal planning includes limiting unhealthy fats, increasing healthy fats, and making other small dietary changes. This includes maintaining a healthy body weight to help keep lipid levels within a normal range. What is my plan? Your health care provider recommends that you:  Get no more than __25__% of the total calories in your daily diet from fat.  Limit your intake of saturated fat to less than __5____% of your total calories each day.  Limit the amount of cholesterol in your diet to less than __300__ mg per day.  What types of fat should I choose?  Choose healthy fats more often. Choose monounsaturated and polyunsaturated fats, such as olive oil and canola oil, flaxseeds, walnuts, almonds, and seeds.  Eat more omega-3 fats. Good choices include salmon, mackerel, sardines, tuna, flaxseed oil, and ground flaxseeds. Aim to eat fish at least two times each week.  Limit saturated fats. Saturated fats are primarily found in animal products, such as meats, butter, and cream. Plant sources of saturated fats include palm oil, palm kernel oil, and coconut oil.  Avoid foods with partially hydrogenated oils in them. These contain trans fats. Examples of foods that contain trans fats are stick margarine, some tub margarines, cookies, crackers, and other baked goods. What general guidelines do I need to follow?  Check food labels carefully to identify foods with trans fats or high amounts of saturated fat.  Fill one half of your plate with vegetables and green salads. Eat 4-5 servings of vegetables per day. A serving of vegetables equals 1 cup of raw leafy vegetables,  cup of raw or cooked cut-up vegetables, or  cup of vegetable juice.  Fill one fourth of your plate with whole grains. Look for the word "whole"  as the first word in the ingredient list.  Fill one fourth of your plate with lean protein foods.  Eat 4-5 servings of fruit per day. A serving of fruit equals one medium whole fruit,  cup of dried fruit,  cup of fresh, frozen, or canned fruit, or  cup of 100% fruit juice.  Eat more foods that contain soluble fiber. Examples of foods that contain this type of fiber are apples, broccoli, carrots, beans, peas, and barley. Aim to get 20-30 g of fiber per day.  Eat more home-cooked food and less restaurant, buffet, and fast food.  Limit or avoid alcohol.  Limit foods that are high in starch and sugar.  Avoid fried foods.  Cook foods by using methods other than frying. Baking, boiling, grilling, and broiling are all great options. Other fat-reducing suggestions include: ? Removing the skin from poultry. ? Removing all visible fats from meats. ? Skimming the fat off of stews, soups, and gravies before serving them. ? Steaming vegetables in water or broth.  Lose weight if you are overweight. Losing just 5-10% of your initial body weight can help your overall health and prevent diseases such as diabetes and heart disease.  Increase your consumption of nuts, legumes, and seeds to 4-5 servings per week. One serving of dried beans or legumes equals  cup after being cooked, one serving of nuts equals 1 ounces, and one serving of seeds equals  ounce or 1 tablespoon.  You may need to monitor your salt (sodium) intake, especially if you have high blood pressure. Talk with your health care provider or dietitian to get  more information about reducing sodium. What foods can I eat? Grains  Breads, including [place], white, pita, wheat, raisin, rye, oatmeal, and [place]. Tortillas that are neither fried nor made with lard or trans fat. Low-fat rolls, including hotdog and hamburger buns and English muffins. Biscuits. Muffins. Waffles. Pancakes. Light popcorn. Whole-grain cereals. Flatbread. Melba  toast. Pretzels. Breadsticks. Rusks. Low-fat snacks and crackers, including oyster, saltine, matzo, graham, animal, and rye. Rice and pasta, including brown rice and those that are made with whole wheat. Vegetables All vegetables. Fruits All fruits, but limit coconut. Meats and Other Protein Sources Lean, well-trimmed beef, veal, pork, and lamb. Chicken and [place] without skin. All fish and shellfish. Wild duck, rabbit, pheasant, and venison. Egg whites or low-cholesterol egg substitutes. Dried beans, peas, lentils, and tofu.Seeds and most nuts. Dairy Low-fat or nonfat cheeses, including ricotta, string, and mozzarella. Skim or 1% milk that is liquid, powdered, or evaporated. Buttermilk that is made with low-fat milk. Nonfat or low-fat yogurt. Beverages Mineral water. Diet carbonated beverages. Sweets and Desserts Sherbets and fruit ices. Honey, jam, marmalade, jelly, and syrups. Meringues and gelatins. Pure sugar candy, such as hard candy, jelly beans, gumdrops, mints, marshmallows, and small amounts of dark chocolate. [employer] Eat all sweets and desserts in moderation. Fats and Oils Nonhydrogenated (trans-free) margarines. Vegetable oils, including soybean, sesame, sunflower, olive, peanut, safflower, corn, canola, and cottonseed. Salad dressings or mayonnaise that are made with a vegetable oil. Limit added fats and oils that you use for cooking, baking, salads, and as spreads. Other Cocoa powder. Coffee and tea. All seasonings and condiments. The items listed above may not be a complete list of recommended foods or beverages. Contact your dietitian for more options. What foods are not recommended? Grains Breads that are made with saturated or trans fats, oils, or whole milk. Croissants. Butter rolls. Cheese breads. Sweet rolls. Donuts. Buttered popcorn. Chow mein noodles. High-fat crackers, such as cheese or butter crackers. Meats and Other Protein Sources Fatty meats, such as  hotdogs, short ribs, sausage, spareribs, bacon, ribeye roast or steak, and mutton. High-fat deli meats, such as salami and bologna. Caviar. Domestic duck and goose. Organ meats, such as kidney, liver, sweetbreads, brains, gizzard, chitterlings, and heart. Dairy Cream, sour cream, cream cheese, and creamed cottage cheese. Whole milk cheeses, including blue (bleu), Monterey Jack, [place], [place], American, [place], Swiss, [place], [place], and [place]. Whole or 2% milk that is liquid, evaporated, or condensed. Whole buttermilk. Cream sauce or high-fat cheese sauce. Yogurt that is made from whole milk. Beverages Regular sodas and drinks with added sugar. Sweets and Desserts Frosting. Pudding. Cookies. Cakes other than angel food cake. Candy that has milk chocolate or white chocolate, hydrogenated fat, butter, coconut, or unknown ingredients. Buttered syrups. Full-fat ice cream or ice cream drinks. Fats and Oils Gravy that has suet, meat fat, or shortening. Cocoa butter, hydrogenated oils, palm oil, coconut oil, palm kernel oil. These can often be found in baked products, candy, fried foods, nondairy creamers, and whipped toppings. Solid fats and shortenings, including bacon fat, salt pork, lard, and butter. Nondairy cream substitutes, such as coffee creamers and sour cream substitutes. Salad dressings that are made of unknown oils, cheese, or sour cream. The items listed above may not be a complete list of foods and beverages to avoid. Contact your dietitian for more information. This information is not intended to replace advice given to you by your health care provider. Make sure you discuss any questions you have with your health care  provider. Document Released: 01/02/2008 Document Revised: 10/13/2015 Document Reviewed: 09/16/2013 Elsevier Interactive Patient Education  2018 [place].   Myofascial Pain Syndrome and Fibromyalgia Myofascial pain syndrome and fibromyalgia are both pain disorders.  This pain may be felt mainly in your muscles.  Myofascial pain syndrome: ? Always has trigger points or tender points in the muscle that will cause pain when pressed. The pain may come and go. ? Usually affects your neck, upper back, and shoulder areas. The pain often radiates into your arms and hands.  Fibromyalgia: ? Has muscle pains and tenderness that come and go. ? Is often associated with fatigue and sleep disturbances. ? Has trigger points. ? Tends to be long-lasting (chronic), but is not life-threatening.  Fibromyalgia and myofascial pain are not the same. However, they often occur together. If you have both conditions, each can make the other worse. Both are common and can cause enough pain and fatigue to make day-to-day activities difficult. What are the causes? The exact causes of fibromyalgia and myofascial pain are not known. People with certain gene types may be more likely to develop fibromyalgia. Some factors can be triggers for both conditions, such as:  Spine disorders.  Arthritis.  Severe injury (trauma) and other physical stressors.  Being under a lot of stress.  A medical illness.  What are the signs or symptoms? Fibromyalgia The main symptom of fibromyalgia is widespread pain and tenderness in your muscles. This can vary over time. Pain is sometimes described as stabbing, shooting, or burning. You may have tingling or numbness, too. You may also have sleep problems and fatigue. You may wake up feeling tired and groggy (fibro fog). Other symptoms may include:  Bowel and bladder problems.  Headaches.  Visual problems.  Problems with odors and noises.  Depression or mood changes.  Painful menstrual periods (dysmenorrhea).  Dry skin or eyes.  Myofascial pain syndrome Symptoms of myofascial pain syndrome include:  Tight, ropy bands of muscle.  Uncomfortable sensations in muscular areas, such  as: ? Aching. ? Cramping. ? Burning. ? Numbness. ? Tingling. ? Muscle weakness.  Trouble moving certain muscles freely (range of motion).  How is this diagnosed? There are no specific tests to diagnose fibromyalgia or myofascial pain syndrome. Both can be hard to diagnose because their symptoms are common in many other conditions. Your health care provider may suspect one or both of these conditions based on your symptoms and medical history. Your health care provider will also do a physical exam. The key to diagnosing fibromyalgia is having pain, fatigue, and other symptoms for more than three months that cannot be explained by another condition. The key to diagnosing myofascial pain syndrome is finding trigger points in muscles that are tender and cause pain elsewhere in your body (referred pain). How is this treated? Treating fibromyalgia and myofascial pain often requires a team of health care providers. This usually starts with your primary provider and a physical therapist. You may also find it helpful to work with alternative health care providers, such as massage therapists or acupuncturists. Treatment for fibromyalgia may include medicines. This may include nonsteroidal anti-inflammatory drugs (NSAIDs), along with other medicines. Treatment for myofascial pain may also include:  NSAIDs.  Cooling and stretching of muscles.  Trigger point injections.  Sound wave (ultrasound) treatments to stimulate muscles.  Follow these instructions at home:  Take medicines only as directed by your health care provider.  Exercise as directed by your health care provider or physical therapist.  Try  to avoid stressful situations.  Practice relaxation techniques to control your stress. You may want to try: ? Biofeedback. ? Visual imagery. ? Hypnosis. ? Muscle relaxation. ? Yoga. ? Meditation.  Talk to your health care provider about alternative treatments, such as acupuncture or  massage treatment.  Maintain a healthy lifestyle. This includes eating a healthy diet and getting enough sleep.  Consider joining a support group.  Do not do activities that stress or strain your muscles. That includes repetitive motions and heavy lifting. Where to find more information:  National Fibromyalgia Association: www.fmaware.[place]: www.arthritis.org  American Chronic Pain Association: [employer].dk Contact a health care provider if:  You have new symptoms.  Your symptoms get worse.  You have side effects from your medicines.  You have trouble sleeping.  Your condition is causing depression or anxiety. This information is not intended to replace advice given to you by your health care provider. Make sure you discuss any questions you have with your health care provider. Document Released: 03/25/2005 Document Revised: 08/31/2015 Document Reviewed: 12/29/2013 Elsevier Interactive Patient Education  2018 [employer].   Levothyroxine tablets What is this medicine? LEVOTHYROXINE (lee voe thye ROX een) is a thyroid hormone. This medicine can improve symptoms of thyroid deficiency such as slow speech, lack of energy, weight gain, hair loss, dry skin, and feeling cold. It also helps to treat goiter (an enlarged thyroid gland). It is also used to treat some kinds of thyroid cancer along with surgery and other medicines. This medicine may be used for other purposes; ask your health care provider or pharmacist if you have questions. COMMON BRAND NAME(S): Estre, Levo-T, Levothroid, Levoxyl, Synthroid, Thyro-Tabs, Unithroid What should I tell my health care provider before I take this medicine? They need to know if you have any of these conditions: -angina -blood clotting problems -diabetes -dieting or on a weight loss program -fertility problems -heart disease -high levels of thyroid hormone -pituitary gland problem -previous  heart attack -an unusual or allergic reaction to levothyroxine, thyroid hormones, other medicines, foods, dyes, or preservatives -pregnant or trying to get pregnant -breast-feeding How should I use this medicine? Take this medicine by mouth with plenty of water. It is best to take on an empty stomach, at least 30 minutes before or 2 hours after food. Follow the directions on the prescription label. Take at the same time each day. Do not take your medicine more often than directed. Contact your pediatrician regarding the use of this medicine in children. While this drug may be prescribed for children and infants as young as a few days of age for selected conditions, precautions do apply. For infants, you may crush the tablet and place in a small amount of (5-10 ml or 1 to 2 teaspoonfuls) of water, breast milk, or non-soy based infant formula. Do not mix with soy-based infant formula. Give as directed. Overdosage: If you think you have taken too much of this medicine contact a poison control center or emergency room at once. NOTE: This medicine is only for you. Do not share this medicine with others. What if I miss a dose? If you miss a dose, take it as soon as you can. If it is almost time for your next dose, take only that dose. Do not take double or extra doses. What may interact with this medicine? -amiodarone -antacids -anti-thyroid medicines -calcium supplements -carbamazepine -cholestyramine -colestipol -digoxin -female hormones, including contraceptive or birth control pills -iron supplements -ketamine -liquid nutrition products like Ensure -  medicines for colds and breathing difficulties -medicines for diabetes -medicines for mental depression -medicines or herbals used to decrease weight or appetite -phenobarbital or other barbiturate medications -phenytoin -prednisone or other corticosteroids -rifabutin -rifampin -soy isoflavones -sucralfate -theophylline -warfarin This  list may not describe all possible interactions. Give your health care provider a list of all the medicines, herbs, non-prescription drugs, or dietary supplements you use. Also tell them if you smoke, drink alcohol, or use illegal drugs. Some items may interact with your medicine. What should I watch for while using this medicine? Be sure to take this medicine with plenty of fluids. Some tablets may cause choking, gagging, or difficulty swallowing from the tablet getting stuck in your throat. Most of these problems disappear if the medicine is taken with the right amount of water or other fluids. Do not switch brands of this medicine unless your health care professional agrees with the change. Ask questions if you are uncertain. You will need regular exams and occasional blood tests to check the response to treatment. If you are receiving this medicine for an underactive thyroid, it may be several weeks before you notice an improvement. Check with your doctor or health care professional if your symptoms do not improve. It may be necessary for you to take this medicine for the rest of your life. Do not stop using this medicine unless your doctor or health care professional advises you to. This medicine can affect blood sugar levels. If you have diabetes, check your blood sugar as directed. You may lose some of your hair when you first start treatment. With time, this usually corrects itself. If you are going to have surgery, tell your doctor or health care professional that you are taking this medicine. What side effects may I notice from receiving this medicine? Side effects that you should report to your doctor or health care professional as soon as possible: -allergic reactions like skin rash, itching or hives, swelling of the face, lips, or tongue -chest pain -excessive sweating or intolerance to heat -fast or irregular heartbeat -nervousness -skin rash or hives -swelling of ankles, feet, or  legs -tremors Side effects that usually do not require medical attention (report to your doctor or health care professional if they continue or are bothersome): -changes in appetite -changes in menstrual periods -diarrhea -hair loss -headache -trouble sleeping -weight loss This list may not describe all possible side effects. Call your doctor for medical advice about side effects. You may report side effects to FDA at 1-800-FDA-1088. Where should I keep my medicine? Keep out of the reach of children. Store at room temperature between 15 and 30 degrees C (59 and 86 degrees F). Protect from light and moisture. Keep container tightly closed. Throw away any unused medicine after the expiration date. NOTE: This sheet is a summary. It may not cover all possible information. If you have questions about this medicine, talk to your doctor, pharmacist, or health care provider.  2018 Elsevier/Gold Standard (2008-07-01 14:28:07)  Please continue all medications as directed. Please start Cymbalta 30mg  once daily for week one, then increase to twice daily. Levothyroxine increased to 240mcg daily- take first thin in am on empty stomach with water only. Mammogram an Colonoscopy orders placed. Follow-up with cardiologist as directed. Return in 6 weeks for thyroid panel and determine is Cymbalta is helping with Fibromyalgia pain.  NICE TO SEE YOU!

## 2017-04-28 NOTE — Assessment & Plan Note (Signed)
L cervical lymphadenopathy that has slowly been increasing in size the last few months. Not tender with palpation.

## 2017-04-28 NOTE — Assessment & Plan Note (Signed)
04/25/17 H/H/ 14/43 Rx strength ferrous sulfate d/c'd- continue OTC iron supplement.

## 2017-04-29 NOTE — Assessment & Plan Note (Signed)
4d ago  04/25/17  Cholesterol, Total 100 - 199 mg/dL 217 Abnormally high    Triglycerides 0 - 149 mg/dL 166 Abnormally high    HDL >39 mg/dL 47   VLDL Cholesterol Cal 5 - 40 mg/dL 33   LDL Calculated 0 - 99 mg/dL 137 Abnormally high    Chol/HDL Ratio 0.0 - 4.4 ratio 4.6 Abnormally high     Refuses statin therapy She will try to better follow heart healthy diet, increase regular movement and take Lovaza as directed- 2 tabs BID, she was only taking 1 tab BID Will re-check lipids in 6 months

## 2017-05-03 ENCOUNTER — Other Ambulatory Visit: Payer: Self-pay | Admitting: Adult Health

## 2017-05-05 ENCOUNTER — Other Ambulatory Visit: Payer: Medicare Other

## 2017-05-29 ENCOUNTER — Encounter: Payer: Self-pay | Admitting: Adult Health

## 2017-06-16 ENCOUNTER — Other Ambulatory Visit: Payer: Self-pay | Admitting: Adult Health

## 2017-06-16 ENCOUNTER — Other Ambulatory Visit: Payer: Self-pay | Admitting: Internal Medicine

## 2017-07-04 ENCOUNTER — Other Ambulatory Visit: Payer: Self-pay | Admitting: Family Medicine

## 2017-08-03 ENCOUNTER — Other Ambulatory Visit: Payer: Self-pay | Admitting: Adult Health

## 2017-08-04 ENCOUNTER — Telehealth: Payer: Self-pay | Admitting: Adult Health

## 2017-08-11 ENCOUNTER — Ambulatory Visit (INDEPENDENT_AMBULATORY_CARE_PROVIDER_SITE_OTHER): Payer: Medicare Other

## 2017-08-11 ENCOUNTER — Encounter (HOSPITAL_COMMUNITY): Payer: Self-pay | Admitting: Family Medicine

## 2017-08-11 ENCOUNTER — Ambulatory Visit (HOSPITAL_COMMUNITY)
Admission: EM | Admit: 2017-08-11 | Discharge: 2017-08-11 | Disposition: A | Discharge status: Home / Self Care | Payer: Medicare Other | Attending: Family Medicine | Admitting: Family Medicine

## 2017-08-11 DIAGNOSIS — W19XXXA Unspecified fall, initial encounter: Secondary | ICD-10-CM | POA: Diagnosis not present

## 2017-08-11 DIAGNOSIS — M25562 Pain in left knee: Secondary | ICD-10-CM

## 2017-08-11 DIAGNOSIS — R2243 Localized swelling, mass and lump, lower limb, bilateral: Secondary | ICD-10-CM | POA: Diagnosis not present

## 2017-08-11 DIAGNOSIS — M25561 Pain in right knee: Secondary | ICD-10-CM

## 2017-08-11 DIAGNOSIS — M25552 Pain in left hip: Secondary | ICD-10-CM

## 2017-08-11 DIAGNOSIS — M25559 Pain in unspecified hip: Secondary | ICD-10-CM

## 2017-08-11 DIAGNOSIS — M1711 Unilateral primary osteoarthritis, right knee: Secondary | ICD-10-CM | POA: Diagnosis not present

## 2017-08-11 DIAGNOSIS — M545 Low back pain: Secondary | ICD-10-CM

## 2017-08-11 DIAGNOSIS — M1712 Unilateral primary osteoarthritis, left knee: Secondary | ICD-10-CM | POA: Diagnosis not present

## 2017-08-11 DIAGNOSIS — M1612 Unilateral primary osteoarthritis, left hip: Secondary | ICD-10-CM

## 2017-08-11 LAB — POCT URINALYSIS DIP (DEVICE)
Glucose, UA: NEGATIVE mg/dL
Leukocytes, UA: NEGATIVE
Nitrite: NEGATIVE
Protein, ur: 30 mg/dL — AB
Specific Gravity, Urine: 1.025 (ref 1.005–1.030)
Urobilinogen, UA: 0.2 mg/dL (ref 0.0–1.0)
pH: 5.5 (ref 5.0–8.0)

## 2017-08-11 MED ORDER — MELOXICAM 7.5 MG PO TABS: 7.5000 mg | ORAL_TABLET | Freq: Every day | ORAL | 0 refills | Status: AC

## 2017-08-11 MED ORDER — CYCLOBENZAPRINE HCL 5 MG PO TABS: 5.0000 mg | ORAL_TABLET | Freq: Every day | ORAL | 0 refills | Status: DC

## 2017-08-11 NOTE — Discharge Instructions (Signed)
Ice application, use of knee sleeves for support and compression. 10 days of daily meloxicam, take with food. May use tylenol as well as needed. Flexeril at night, may cause drowsiness. Please follow up with your primary care provider for recheck and long term management of your symptoms.

## 2017-08-11 NOTE — ED Triage Notes (Signed)
Pt here for bilateral  hip pain and knee pain for months. 2 weeks ago she fell over her dog and she landed on her left knee. sts left leg pain, hip pain and knee pain. She also reports that her ankles are swollen  And back pain and she believes she may have a kidney infection,.

## 2017-08-11 NOTE — ED Provider Notes (Signed)
Kayla [name]    CSN: [id number] Arrival date & time: 08/11/17  1554     History   Chief Complaint Chief Complaint  Patient presents with  . Fall  . Leg Pain    HPI Kayla [name] is a 72 y.o. female.   Kayla [name] presents with complaints of increased pain to left knee and left hip, as well as right knee, after a fall onto her floor approximately 2 months ago. History of chronic pain to these areas as well as to low back and has had other previous falls in the past. States however pain has increased, primarily to left knee and hip, making sleep difficult. Pain with raising of the leg such as getting into the car. Pain radiates to the foot at times. Chronic low back pain as well. Denies any urinary symptoms but states she is worried about kidney infection. Bilateral ankle swelling, L>R. History of firbo, osteoporosis, arthritis, scoliosis. Has had significant increased stress at home. Tylenol has not helped with pain.    ROS per HPI.      Past Medical History:  Diagnosis Date  . Anemia   . Asthma   . B12 deficiency   . Chronic LBP   . Diastolic dysfunction   . Fibromyalgia   . GERD (gastroesophageal reflux disease)   . Hyperlipemia   . Hypertension   . Hypothyroid   . IBS (irritable bowel syndrome)   . S/P cardiac cath 11/30/2007   normal coronaries - Dr. Burt Knack (Dr. Debara Pickett reviewed films on 01/10/2016)    Patient Active Problem List   Diagnosis Date Noted  . Hematuria 04/28/2017  . Multiple atypical skin moles 04/28/2017  . Swollen lymph nodes 04/28/2017  . Screening for colon cancer 04/28/2017  . Healthcare maintenance 04/28/2017  . Shakiness 11/14/2016  . Otitis media of left ear 09/11/2016  . Depression, recurrent ([place]) 07/16/2016  . Adjustment disorder with mixed anxiety and depressed mood 01/15/2016  . Fe Def Anemia 12/22/2015  . Scoliosis (and kyphoscoliosis), idiopathic 12/20/2015  . Osteopenia 12/20/2015  . PCP NOTES >>> 01/22/2015  . Fibromyalgia  08/02/2014  . Chest pain 07/07/2014  . DOE (dyspnea on exertion)- chronic 07/07/2014  . GAD (generalized anxiety disorder) 09/17/2012  . UTI (lower urinary tract infection) 01/24/2012  . Vitamin D deficiency 01/24/2012  . Insomnia 01/24/2012  . Fatigue 10/14/2011  . Neck pain 07/23/2010  . Screening for breast cancer 07/23/2010  . B12 deficiency   . Hyperlipidemia 11/30/2007  . CAD (coronary artery disease) 11/30/2007  . DYSPNEA 11/30/2007  . PALPITATIONS, OCCASIONAL 04/15/2007  . Hypothyroidism 01/27/2007  . Essential hypertension 01/27/2007  . Reactive airway disease that is not asthma 01/27/2007  . GERD 01/27/2007  . IBS 01/27/2007  . BACK PAIN, CHRONIC 01/27/2007    Past Surgical History:  Procedure Laterality Date  . CESAREAN SECTION    . CHOLECYSTECTOMY    . NECK SURGERY  1998    OB History   None      Home Medications    Prior to Admission medications   Medication Sig Start Date End Date Taking? Authorizing Provider  aspirin 81 MG tablet Take 81 mg by mouth daily.    [provider]  [facility] VITAMIN B12 1000 MCG tablet TAKE 1 TABLET (1,000 MCG TOTAL) BY MOUTH DAILY. 10/07/16   Opalski, Neoma Laming, DO  Cyanocobalamin (VITAMIN B 12 PO) Take 1 tablet by mouth daily.    [provider]  cyclobenzaprine (FLEXERIL) 5 MG tablet Take 1 tablet (  5 mg total) by mouth at bedtime. 08/11/17   Zigmund Gottron, NP  diltiazem (MATZIM LA) 360 MG 24 hr tablet Take 1 tablet (360 mg total) daily by mouth. (Future RF's per Cards- Dr Debara Pickett) 02/13/17   Mina Marble D, NP  DULoxetine (CYMBALTA) 30 MG capsule 1 tab daily for week one, then increase to one tab every 12 hrs week two 04/28/17   Danford, Valetta Fuller D, NP  levothyroxine (SYNTHROID, LEVOTHROID) 200 MCG tablet Take 1 tablet (200 mcg total) by mouth daily before breakfast. PATIENT NEEDS LABS PRIOR TO ANY FURTHER REFILLS 08/04/17   Danford, Valetta Fuller D, NP  magnesium 30 MG tablet Take 30 mg by mouth daily.    [provider]    MATZIM LA 360 MG 24 hr tablet TAKE 1 TABLET (360 MG TOTAL) BY MOUTH DAILY. (FUTURE RF'S PER CARDS- DR HILTY) 06/16/17   Hilty, Nadean Corwin, MD  meloxicam (MOBIC) 7.5 MG tablet Take 1 tablet (7.5 mg total) by mouth daily for 10 days. 08/11/17 08/21/17  Zigmund Gottron, NP  omega-3 acid ethyl esters (LOVAZA) 1 g capsule TAKE 2 CAPSULES (2 G TOTAL) BY MOUTH 2 (TWO) TIMES DAILY. 07/08/17   Danford, Valetta Fuller D, NP  Potassium 75 MG TABS Take 1 tablet daily by mouth.     [provider]  Vitamin D, Ergocalciferol, (DRISDOL) 50000 units CAPS capsule Take 1 capsule (50,000 Units total) by mouth once a week. 09/20/16   Mellody Dance, DO    Family History Family History  Problem Relation Age of Onset  . Heart attack Father 78  . Lung cancer Unknown        uncle, non smoker  . Leukemia Unknown        uncle  . Cancer Mother        oral  . Stroke Mother   . Cancer Maternal Aunt        breast  . Alcohol abuse Maternal Aunt   . Alcohol abuse Maternal Uncle   . Cancer Maternal Uncle        lung, stomach, oral  . Cancer Paternal Uncle        lung  . Congestive Heart Failure Maternal Grandfather   . Heart attack Paternal Grandfather   . Cancer Paternal Uncle        bone  . Colon cancer Neg Hx   . Breast cancer Neg Hx   . Diabetes Neg Hx     Social History Social History   Tobacco Use  . Smoking status: Never Smoker  . Smokeless tobacco: Never Used  Substance Use Topics  . Alcohol use: No  . Drug use: No     Allergies   Ace inhibitors and Lisinopril   Review of Systems Review of Systems   Physical Exam Triage Vital Signs ED Triage Vitals  Enc Vitals Group     BP 08/11/17 1631 (!) 142/84     Pulse Rate 08/11/17 1631 68     Resp 08/11/17 1631 18     Temp 08/11/17 1631 98.4 F (36.9 C)     Temp src --      SpO2 08/11/17 1631 97 %     Weight --      Height --      Head Circumference --      Peak Flow --      Pain Score 08/11/17 1630 8     Pain Loc --      Pain Edu?  --  Excl. in GC? --    No data found.  Updated Vital Signs BP (!) 142/84   Pulse 68   Temp 98.4 F (36.9 C)   Resp 18   SpO2 97%   Physical Exam  Constitutional: She is oriented to person, place, and time. She appears well-developed and well-nourished. No distress.  Cardiovascular: Normal rate, regular rhythm and normal heart sounds.  Pulmonary/Chest: Effort normal and breath sounds normal.  Musculoskeletal:       Left hip: She exhibits decreased strength, tenderness and bony tenderness. She exhibits normal range of motion, no swelling, no crepitus, no deformity and no laceration.       Right knee: She exhibits normal range of motion and no swelling. Tenderness found.       Left knee: She exhibits decreased range of motion and bony tenderness. She exhibits no swelling, no effusion, no ecchymosis, no deformity, no laceration, no erythema, normal alignment, no LCL laxity, normal patellar mobility, normal meniscus and no MCL laxity. Tenderness found.       Right foot: There is swelling.       Left foot: There is swelling.  Non specific pain generalized to right knee; pain with weight bearing to left knee and hip; tenderness noted to proximal tibial tuberosity as well as to medial knee; without pain with MCL of LCL stress; no pain with knee flexion or extension; without pain to hip with flexion, extension or rotation; pain on palpation to proximal femur; generalized low back pain, patient reports unchanged from chronic, worse with laying flat and transition from lying flat to sitting; without pain with straight leg raise  Neurological: She is alert and oriented to person, place, and time.  Skin: Skin is warm and dry.     UC Treatments / Results  Labs (all labs ordered are listed, but only abnormal results are displayed) Labs Reviewed  POCT URINALYSIS DIP (DEVICE) - Abnormal; Notable for the following components:      Result Value   Bilirubin Urine SMALL (*)    Ketones, ur TRACE  (*)    Hgb urine dipstick TRACE (*)    Protein, ur 30 (*)    All other components within normal limits    EKG None  Radiology Dg Knee 2 Views Left  Result Date: 08/11/2017 CLINICAL DATA:  Acute on chronic left knee pain. EXAM: LEFT KNEE - 1-2 VIEW COMPARISON:  Earlier radiographs of the left knee performed on same day. FINDINGS: A repeat lateral view is provided due to technical limitations on the initial lateral view acquired. No joint effusion or malalignment is seen. No fracture or suspicious osseous lesions. There is mild patellofemoral and femorotibial joint space narrowing. IMPRESSION: No acute osseous abnormality. No malalignment or joint effusion. Femorotibial and patellofemoral joint space narrowing is seen. Electronically Signed   By: Ashley Royalty M.D.   On: 08/11/2017 18:53   Dg Hip Unilat With Pelvis 2-3 Views Left  Result Date: 08/11/2017 CLINICAL DATA:  72 year old with multiple recent falls, presenting with LEFT hip pain and BILATERAL knee pain. Initial encounter. EXAM: DG HIP (WITH OR WITHOUT PELVIS) 2-3V LEFT COMPARISON:  None. FINDINGS: No evidence of acute or subacute fracture or dislocation. Moderate MEDIAL joint space narrowing. Included AP pelvis demonstrates MEDIAL joint space narrowing in the contralateral RIGHT hip to a lesser degree. Sacroiliac joints and symphysis pubis intact with mild degenerative changes. No fractures elsewhere involving the pelvis. Bone mineral density well-preserved. IMPRESSION: 1. No acute or subacute osseous abnormality. 2. Moderate  osteoarthritis involving the LEFT hip. Electronically Signed   By: Evangeline Dakin M.D.   On: 08/11/2017 18:21   Dg Knee Ap/lat W/sunrise Left  Result Date: 08/11/2017 CLINICAL DATA:  Acute on chronic left knee pain after multiple falls over the past month. EXAM: LEFT KNEE 3 VIEWS COMPARISON:  None. FINDINGS: The patient was reportedly unable to lay flat for standard imaging of the left knee. The left lateral view is  therefore limited but contains an abnormal appearance of the patella with a cortical irregularity involving the lower and upper poles with what appear to be small ossific densities projecting within the prepatellar soft tissues. The patient subsequently had a repeat lateral view where the patella appears normal and this is likely due to imaging artifact. No patella alta or baja. No joint effusion is identified. Femorotibial and patellofemoral joint space narrowing is identified. IMPRESSION: Limited lateral view of the left knee due to patient having difficulty laying flat due to hip pain. Unusual appearance of the patella secondary to imaging artifact as a repeat lateral view with subsequently acquired demonstrating more normal degenerative appearance of the patella without fracture. There is tricompartmental osteoarthritis of the knee. No joint effusion is noted. Electronically Signed   By: Ashley Royalty M.D.   On: 08/11/2017 18:46   Dg Knee Ap/lat W/sunrise Right  Result Date: 08/11/2017 CLINICAL DATA:  72 year old with multiple recent falls, presenting with LEFT hip pain and BILATERAL knee pain. Initial encounter. EXAM: RIGHT KNEE 3 VIEWS COMPARISON:  None. FINDINGS: No evidence of acute or subacute fracture or dislocation. Severe narrowing of the MEDIAL and patellofemoral compartment joint spaces and mild narrowing of the LATERAL compartment joint space. Osseous demineralization. Small joint effusion. IMPRESSION: 1. No acute or subacute osseous abnormality. 2. Tricompartment osteoarthritis, most severe in the MEDIAL and patellofemoral compartments. 3. Small joint effusion. Electronically Signed   By: Evangeline Dakin M.D.   On: 08/11/2017 18:19    Procedures Procedures (including critical care time)  Medications Ordered in UC Medications - No data to display  Initial Impression / Assessment and Plan / UC Course  I have reviewed the triage vital signs and the nursing notes.  Pertinent labs & imaging  results that were available during my care of the patient were reviewed by me and considered in my medical decision making (see chart for details).     xrays without acute findings. meloxicam daily x10 days. Patient has used flexeril in the past which has helped her sleep, provided 5mg  tabs today. Bilateral knee sleeves. Follow up with PCP. Patient verbalized understanding and agreeable to plan.  Ambulatory out of clinic without difficulty with limp noted.    Final Clinical Impressions(s) / UC Diagnoses   Final diagnoses:  Hip pain  Fall, initial encounter  Osteoarthritis of left knee, unspecified osteoarthritis type  Osteoarthritis of left hip, unspecified osteoarthritis type     Discharge Instructions     Ice application, use of knee sleeves for support and compression. 10 days of daily meloxicam, take with food. May use tylenol as well as needed. Flexeril at night, may cause drowsiness. Please follow up with your primary care provider for recheck and long term management of your symptoms.     ED Prescriptions    Medication Sig Dispense Auth. Provider   meloxicam (MOBIC) 7.5 MG tablet Take 1 tablet (7.5 mg total) by mouth daily for 10 days. 10 tablet Augusto Gamble B, NP   cyclobenzaprine (FLEXERIL) 5 MG tablet Take 1 tablet (5 mg  total) by mouth at bedtime. 15 tablet Zigmund Gottron, NP     Controlled Substance Prescriptions [place] Controlled Substance Registry consulted? Not Applicable   Zigmund Gottron, NP 08/11/17 1904

## 2017-08-20 ENCOUNTER — Other Ambulatory Visit: Payer: Self-pay | Admitting: Family Medicine

## 2017-08-21 ENCOUNTER — Ambulatory Visit: Payer: Medicare Other | Admitting: Adult Health

## 2017-08-25 ENCOUNTER — Ambulatory Visit: Payer: Medicare Other | Admitting: Adult Health

## 2017-09-03 ENCOUNTER — Ambulatory Visit: Payer: Medicare Other | Admitting: Adult Health

## 2017-09-08 ENCOUNTER — Other Ambulatory Visit: Payer: Self-pay | Admitting: Adult Health

## 2017-09-29 ENCOUNTER — Other Ambulatory Visit: Payer: Self-pay | Admitting: Internal Medicine

## 2017-09-29 NOTE — Telephone Encounter (Signed)
Rx request sent to pharmacy.  

## 2017-10-01 ENCOUNTER — Ambulatory Visit
Admission: RE | Admit: 2017-10-01 | Discharge: 2017-10-01 | Disposition: A | Discharge status: Home / Self Care | Payer: Medicare Other | Source: Ambulatory Visit | Attending: Adult Health | Admitting: Adult Health

## 2017-10-01 DIAGNOSIS — R599 Enlarged lymph nodes, unspecified: Secondary | ICD-10-CM

## 2017-10-08 ENCOUNTER — Other Ambulatory Visit: Payer: Self-pay

## 2017-10-08 DIAGNOSIS — R599 Enlarged lymph nodes, unspecified: Secondary | ICD-10-CM

## 2017-10-08 NOTE — Progress Notes (Signed)
ul ?

## 2017-10-13 ENCOUNTER — Other Ambulatory Visit: Payer: Self-pay | Admitting: Adult Health

## 2017-10-16 ENCOUNTER — Ambulatory Visit: Payer: Medicare Other | Admitting: Adult Health

## 2017-10-29 ENCOUNTER — Encounter: Payer: Self-pay | Admitting: Adult Health

## 2017-10-29 ENCOUNTER — Ambulatory Visit (INDEPENDENT_AMBULATORY_CARE_PROVIDER_SITE_OTHER): Payer: Medicare Other | Admitting: Adult Health

## 2017-10-29 VITALS — BP 153/81 | HR 76 | Ht 64.0 in | Wt 219.6 lb

## 2017-10-29 DIAGNOSIS — E079 Disorder of thyroid, unspecified: Secondary | ICD-10-CM

## 2017-10-29 DIAGNOSIS — D508 Other iron deficiency anemias: Secondary | ICD-10-CM

## 2017-10-29 DIAGNOSIS — I10 Essential (primary) hypertension: Secondary | ICD-10-CM

## 2017-10-29 DIAGNOSIS — F339 Major depressive disorder, recurrent, unspecified: Secondary | ICD-10-CM

## 2017-10-29 DIAGNOSIS — E785 Hyperlipidemia, unspecified: Secondary | ICD-10-CM

## 2017-10-29 DIAGNOSIS — E039 Hypothyroidism, unspecified: Secondary | ICD-10-CM

## 2017-10-29 DIAGNOSIS — I25119 Atherosclerotic heart disease of native coronary artery with unspecified angina pectoris: Secondary | ICD-10-CM

## 2017-10-29 DIAGNOSIS — Z0000 Encounter for general adult medical examination without abnormal findings: Secondary | ICD-10-CM

## 2017-10-29 DIAGNOSIS — R5383 Other fatigue: Secondary | ICD-10-CM

## 2017-10-29 MED ORDER — HYDROCHLOROTHIAZIDE 25 MG PO TABS: 25.0000 mg | ORAL_TABLET | Freq: Every day | ORAL | 1 refills | Status: DC

## 2017-10-29 MED ORDER — LEVOTHYROXINE SODIUM 200 MCG PO TABS: 200.0000 ug | ORAL_TABLET | Freq: Every day | ORAL | 0 refills | Status: DC

## 2017-10-29 MED ORDER — KETOCONAZOLE 2 % EX SHAM: 1.0000 "application " | MEDICATED_SHAMPOO | CUTANEOUS | 0 refills | Status: DC

## 2017-10-29 NOTE — Progress Notes (Addendum)
Subjective:    Patient ID: Kayla [name], female    DOB: January 24, 1946, 72 y.o.   MRN: [id number]  HPI  11/14/2016 OV:  [name] presents with increased anxiety r/t to financial struggles.  She has hx of depression, anxiety, and fibromyalgia.  She also reports "body shaking, mostly from my waist up" that has been intermittent for the last 3 months.  She reported that this occurred 7 years ago and it was related to hypokalemia.  She denies tobacco/EOTH use and drinks at least 60 ounces water and fruit juices day.  She was provided alprazolam rx last week which she reports she lost.  She denies CP/dyspnea at rest/palpitations.  Her last TSH was 01/02/17- 4.50, she is taking levothyroxine 257mcg daily. She was previously on Prozac, tolerated it well without SE and good sx control.  02/13/2017 OV: [name] is here for f/u: anxiety and thyroid level lab draw.  She has not taken  Fluoxetine in >3 weeks and reports sig decrease in anxiety.  She denies CP/dyspnea/palpitations. Her levothyroxine was reduced from 200 to 126mcg daily in August (11/14/16 TSH 0.258), which may have helped with improvement of mood. She estimates to drink 40 oz water/day and been trying to reduce CHO/sugar/satruated fat intake- she has >3 lbs since last OV in August.  04/28/17 OV: [name] is here for CPE.  She reports taking her Levothyroxine 18mcg at night on full stomach.  We discussed at length that Levothyroxine needs to be taken on empty stomach 30-63mins with water.  She denies CP/palpitations.  Dyspnea with exertion has not increased in frequency/seerity.  She reports "tingling in my arms/legs" and a "constant ache from this fibromyalgia. She denies tobacco/ETOH use. She estimates to drink several bottle of water/day. She denies regular exercise, however cares for her 83 year old grandson that has severe ADHD-which requires a lot of cleaning and "running around".  Healthcare Maintenance: PAP- not indicated Mammogram- due,  ordered Colonoscopy- due, ordered DEXA- overdue, declined today.  10/29/17 OV: [name] is here for regular f/u: hypothyroidism, HLD, Fatigue, Fibromyalgia, GAD, DOE. She only took Atorvastatin for 4 weeks jan-feb 2019, then stopped "b/c I read on google all the bad things that drug can cause" She has been on Diltiazem (Matzim LA) >10 years, has not seen cards since 2016 She has been off/on Levothyroxine for last 3 weeks, she did not return in 06/2017 for repeat TSH after med adjustment in Jan 2019 Dr. [facility]- Rheumatologist- treating her RA/Fibromyalgia-cortisone injection to L knee x 2 in the last few months, she is unsure of specific dates and ortho appt's aren't in system She reports decreased in "neck nodule swelling" and "cracked lips" with cortisone therapy She has been dealing with multiple "family issues" and recent death of her 29 year old dog She denies thoughts if harming herself/others She denies CP and reports dyspnea has not increased from her baseline  Patient Care Team    Relationship Specialty Notifications Start End  [name], NP PCP - General Family Medicine  11/14/16   Allyn Kenner, MD  Dermatology  12/20/15   Pixie Casino, MD Consulting Physician Cardiology  01/01/16   Lahoma Rocker, MD Referring Physician Rheumatology  01/01/16    Comment: fibromyalgia/  and osteopania and scolisois  [facility]    08/04/17     Patient Active Problem List   Diagnosis Date Noted  . Hematuria 04/28/2017  . Multiple atypical skin moles 04/28/2017  .  Swollen lymph nodes 04/28/2017  . Screening for colon cancer 04/28/2017  . Healthcare maintenance 04/28/2017  . Shakiness 11/14/2016  . Otitis media of left ear 09/11/2016  . Depression, recurrent ([place]) 07/16/2016  . Adjustment disorder with mixed anxiety and depressed mood 01/15/2016  . Fe Def Anemia 12/22/2015  . Scoliosis (and kyphoscoliosis), idiopathic 12/20/2015  . Osteopenia  12/20/2015  . PCP NOTES >>> 01/22/2015  . Fibromyalgia 08/02/2014  . Chest pain 07/07/2014  . DOE (dyspnea on exertion)- chronic 07/07/2014  . GAD (generalized anxiety disorder) 09/17/2012  . UTI (lower urinary tract infection) 01/24/2012  . Vitamin D deficiency 01/24/2012  . Insomnia 01/24/2012  . Fatigue 10/14/2011  . Neck pain 07/23/2010  . Screening for breast cancer 07/23/2010  . B12 deficiency   . Hyperlipidemia 11/30/2007  . CAD (coronary artery disease) 11/30/2007  . DYSPNEA 11/30/2007  . PALPITATIONS, OCCASIONAL 04/15/2007  . Hypothyroidism 01/27/2007  . Essential hypertension 01/27/2007  . Reactive airway disease that is not asthma 01/27/2007  . GERD 01/27/2007  . IBS 01/27/2007  . BACK PAIN, CHRONIC 01/27/2007     Past Medical History:  Diagnosis Date  . Anemia   . Asthma   . B12 deficiency   . Chronic LBP   . Diastolic dysfunction   . Fibromyalgia   . GERD (gastroesophageal reflux disease)   . Hyperlipemia   . Hypertension   . Hypothyroid   . IBS (irritable bowel syndrome)   . S/P cardiac cath 11/30/2007   normal coronaries - Dr. Burt Knack (Dr. Debara Pickett reviewed films on 01/10/2016)     Past Surgical History:  Procedure Laterality Date  . CESAREAN SECTION    . CHOLECYSTECTOMY    . NECK SURGERY  1998     Family History  Problem Relation Age of Onset  . Heart attack Father 72  . Lung cancer Unknown        uncle, non smoker  . Leukemia Unknown        uncle  . Cancer Mother        oral  . Stroke Mother   . Cancer Maternal Aunt        breast  . Alcohol abuse Maternal Aunt   . Alcohol abuse Maternal Uncle   . Cancer Maternal Uncle        lung, stomach, oral  . Cancer Paternal Uncle        lung  . Congestive Heart Failure Maternal Grandfather   . Heart attack Paternal Grandfather   . Cancer Paternal Uncle        bone  . Colon cancer Neg Hx   . Breast cancer Neg Hx   . Diabetes Neg Hx      Social History   Substance and Sexual Activity   Drug Use No     Social History   Substance and Sexual Activity  Alcohol Use No     Social History   Tobacco Use  Smoking Status Never Smoker  Smokeless Tobacco Never Used     Outpatient Encounter Medications as of 10/29/2017  Medication Sig  . [employer] VITAMIN B12 1000 MCG tablet TAKE 1 TABLET (1,000 MCG TOTAL) BY MOUTH DAILY.  . magnesium 30 MG tablet Take 30 mg by mouth daily.  Marland Kitchen MATZIM LA 360 MG 24 hr tablet TAKE 1 TABLET (360 MG TOTAL) BY MOUTH DAILY. (FUTURE RF'S PER CARDS- DR HILTY)  . omega-3 acid ethyl esters (LOVAZA) 1 g capsule TAKE 2 CAPSULES (2 G TOTAL) BY MOUTH 2 (TWO) TIMES  DAILY.  . Potassium 75 MG TABS Take 1 tablet daily by mouth.   . [DISCONTINUED] levothyroxine (SYNTHROID, LEVOTHROID) 200 MCG tablet Take 1 tablet (200 mcg total) by mouth daily before breakfast. NO FURTHER REFILLS UNTIL PATIENT RETURNS TO CLINIC!!  . [DISCONTINUED] levothyroxine (SYNTHROID, LEVOTHROID) 200 MCG tablet Take 1 tablet (200 mcg total) by mouth daily before breakfast. NO FURTHER REFILLS UNTIL PATIENT RETURNS TO CLINIC!!  . hydrochlorothiazide (HYDRODIURIL) 25 MG tablet Take 1 tablet (25 mg total) by mouth daily.  Marland Kitchen ketoconazole (NIZORAL) 2 % shampoo Apply 1 application topically 2 (two) times a week.  . [DISCONTINUED] aspirin 81 MG tablet Take 81 mg by mouth daily.  . [DISCONTINUED] cyclobenzaprine (FLEXERIL) 5 MG tablet Take 1 tablet (5 mg total) by mouth at bedtime.  . [DISCONTINUED] diltiazem (MATZIM LA) 360 MG 24 hr tablet Take 1 tablet (360 mg total) daily by mouth. (Future RF's per Cards- Dr Debara Pickett)  . [DISCONTINUED] DULoxetine (CYMBALTA) 30 MG capsule 1 tab daily for week one, then increase to one tab every 12 hrs week two  . [DISCONTINUED] Vitamin D, Ergocalciferol, (DRISDOL) 50000 units CAPS capsule Take 1 capsule (50,000 Units total) by mouth once a week.   No facility-administered encounter medications on file as of 10/29/2017.     Allergies: Ace inhibitors and  Lisinopril  Body mass index is 37.69 kg/m.  Blood pressure (!) 153/81, pulse 76, height 5\' 4"  (1.626 m), weight 219 lb 9.6 oz (99.6 kg), SpO2 96 %.  Review of Systems  Constitutional: Negative for activity change, appetite change, chills, diaphoresis, fever and unexpected weight change.  HENT: Negative for congestion.   Eyes: Negative for visual disturbance.  Respiratory: Negative for cough, chest tightness, shortness of breath and wheezing.   Cardiovascular: Negative for chest pain, palpitations and leg swelling.  Gastrointestinal: Negative for abdominal distention, abdominal pain, blood in stool, constipation, diarrhea, nausea and vomiting.  Endocrine: Negative for cold intolerance, heat intolerance, polydipsia, polyphagia and polyuria.  Genitourinary: Negative for difficulty urinating and flank pain.  Musculoskeletal: Positive for arthralgias, back pain, gait problem, joint swelling, myalgias, neck pain and neck stiffness.  Skin: Negative for color change, pallor and rash.  Allergic/Immunologic: Negative for immunocompromised state.  Neurological: Negative for dizziness, tremors, weakness and headaches.  Psychiatric/Behavioral: Positive for dysphoric mood and sleep disturbance. Negative for agitation, behavioral problems, confusion, decreased concentration, hallucinations, self-injury and suicidal ideas. The patient is nervous/anxious. The patient is not hyperactive.        Objective:   Physical Exam  Constitutional: She is oriented to person, place, and time. She appears well-developed and well-nourished. No distress.  HENT:  Head: Normocephalic and atraumatic.  Right Ear: External ear normal.  Left Ear: External ear normal.  Eyes: Pupils are equal, round, and reactive to light. Conjunctivae are normal.  Neck: Normal range of motion. Neck supple. No tracheal deviation present.    One, round, mobile, discrete mass. No tenderness with palpation.  Cardiovascular: Normal rate,  regular rhythm, normal heart sounds and intact distal pulses.  No murmur heard. Pulmonary/Chest: Effort normal and breath sounds normal. No respiratory distress. She has no decreased breath sounds. She has no wheezes. She has no rhonchi. She has no rales. She exhibits no tenderness. Right breast exhibits no inverted nipple, no mass, no nipple discharge, no skin change and no tenderness. Left breast exhibits no inverted nipple, no mass, no nipple discharge and no tenderness.  Abdominal: Soft. Bowel sounds are normal. She exhibits no distension and no mass. There is  no tenderness. There is no rebound and no guarding.  Musculoskeletal: She exhibits edema and tenderness.       Right ankle: She exhibits swelling.       Left ankle: She exhibits swelling.       Lumbar back: She exhibits tenderness.  Lymphadenopathy:    She has cervical adenopathy.  Neurological: She is alert and oriented to person, place, and time. Coordination normal.  Skin: Skin is warm. No rash noted. She is not diaphoretic. No erythema. No pallor.  Psychiatric: Her speech is normal and behavior is normal. Judgment and thought content normal. Her mood appears anxious. Cognition and memory are normal.  Nursing note and vitals reviewed.     Assessment & Plan:   1. Hypothyroidism, unspecified type   2. Thyroid disease   3. Other iron deficiency anemia   4. Healthcare maintenance   5. Fatigue, unspecified type   6. Essential hypertension   7. Coronary artery disease involving native heart with angina pectoris, unspecified vessel or lesion type ([place])   8. Hyperlipidemia, unspecified hyperlipidemia type   9. Depression, recurrent ([place])     Essential hypertension BP above goal 153/81, HR 76 Bil ankle edema Continue on Diltiazem 360mg  QD and start on HCTZ 25mg  QD  CAD (coronary artery disease) Advised to follow- up with Dr. Debara Pickett She continues to deny hx of cardiac cath  Healthcare maintenance  Continue all medications as  directed. Thyroid panel drawn today- referral to Endocrinology placed. Increase water intake and follow Mediterranean diet. Please start Hydrochlorothiazide 25mg  once daily. Please use Ketoconazole shampoo as directed to treat reddened skin underneath breasts. Follow-up with Dr. Debara Pickett, re: blood pressure and shortness of breath  Referral to Endocrinology placed Follow-up 6 weeks-labs/BP check   Hyperlipidemia The 10-year ASCVD risk score Mikey Bussing DC Brooke Bonito., et al., 2013) is: 22.1%   Values used to calculate the score:     Age: 25 years     Sex: Female     Is Non-Hispanic African American: No     Diabetic: No     Tobacco smoker: No     Systolic Blood Pressure: 749 mmHg     Is BP treated: Yes     HDL Cholesterol: 47 mg/dL     Total Cholesterol: 217 mg/dL  LDL-137 She only used statin Jan - feb, self d/c'd Advised to resume, she refused Advised to follow heart healthy diet and increase regular exercise  Hypothyroidism 04/25/17 TSH- 51 Levothyroxine increased, advised to f/u for labs 2/19, she did not She has been off Levothyroxine 21mcg >3 weeks Thyroid studies ordered today, Endocrinology referral placed  Depression, recurrent ([place]) Declined CBT referral She denies thoughts of harming herself/others  Pt was in the office today for 45+ minutes, I spent over 75% time spent in face to face counseling of various medical concerns and in coordination of care  Follow-up in 6 weeks -labs/fibromyalgia/HTN

## 2017-10-29 NOTE — Assessment & Plan Note (Addendum)
  Continue all medications as directed. Thyroid panel drawn today- referral to Endocrinology placed. Increase water intake and follow Mediterranean diet. Please start Hydrochlorothiazide 25mg  once daily. Please use Ketoconazole shampoo as directed to treat reddened skin underneath breasts. Follow-up with Dr. Debara Pickett, re: blood pressure and shortness of breath  Referral to Endocrinology placed Follow-up 6 weeks-labs/BP check

## 2017-10-29 NOTE — Patient Instructions (Addendum)
Mediterranean Diet A Mediterranean diet refers to food and lifestyle choices that are based on the traditions of countries located on the [place]. This way of eating has been shown to help prevent certain conditions and improve outcomes for people who have chronic diseases, like kidney disease and heart disease. What are tips for following this plan? Lifestyle  Cook and eat meals together with your family, when possible.  Drink enough fluid to keep your urine clear or pale yellow.  Be physically active every day. This includes: ? Aerobic exercise like running or swimming. ? Leisure activities like gardening, walking, or housework.  Get 7-8 hours of sleep each night.  If recommended by your health care provider, drink red wine in moderation. This means 1 glass a day for nonpregnant women and 2 glasses a day for men. A glass of wine equals 5 oz (150 mL). Reading food labels  Check the serving size of packaged foods. For foods such as rice and pasta, the serving size refers to the amount of cooked product, not dry.  Check the total fat in packaged foods. Avoid foods that have saturated fat or trans fats.  Check the ingredients list for added sugars, such as corn syrup. Shopping  At the grocery store, buy most of your food from the areas near the walls of the store. This includes: ? Fresh fruits and vegetables (produce). ? Grains, beans, nuts, and seeds. Some of these may be available in unpackaged forms or large amounts (in bulk). ? Fresh seafood. ? Poultry and eggs. ? Low-fat dairy products.  Buy whole ingredients instead of prepackaged foods.  Buy fresh fruits and vegetables in-season from local farmers markets.  Buy frozen fruits and vegetables in resealable bags.  If you do not have access to quality fresh seafood, buy precooked frozen shrimp or canned fish, such as tuna, salmon, or sardines.  Buy small amounts of raw or cooked vegetables, salads, or olives from the  deli or salad bar at your store.  Stock your pantry so you always have certain foods on hand, such as olive oil, canned tuna, canned tomatoes, rice, pasta, and beans. Cooking  Cook foods with extra-virgin olive oil instead of using butter or other vegetable oils.  Have meat as a side dish, and have vegetables or grains as your main dish. This means having meat in small portions or adding small amounts of meat to foods like pasta or stew.  Use beans or vegetables instead of meat in common dishes like chili or lasagna.  Experiment with different cooking methods. Try roasting or broiling vegetables instead of steaming or sauteing them.  Add frozen vegetables to soups, stews, pasta, or rice.  Add nuts or seeds for added healthy fat at each meal. You can add these to yogurt, salads, or vegetable dishes.  Marinate fish or vegetables using olive oil, lemon juice, garlic, and fresh herbs. Meal planning  Plan to eat 1 vegetarian meal one day each week. Try to work up to 2 vegetarian meals, if possible.  Eat seafood 2 or more times a week.  Have healthy snacks readily available, such as: ? Vegetable sticks with hummus. ? Greek yogurt. ? Fruit and nut trail mix.  Eat balanced meals throughout the week. This includes: ? Fruit: 2-3 servings a day ? Vegetables: 4-5 servings a day ? Low-fat dairy: 2 servings a day ? Fish, poultry, or lean meat: 1 serving a day ? Beans and legumes: 2 or more servings a week ? Nuts   and seeds: 1-2 servings a day ? Whole grains: 6-8 servings a day ? Extra-virgin olive oil: 3-4 servings a day  Limit red meat and sweets to only a few servings a month What are my food choices?  Mediterranean diet ? Recommended ? Grains: Whole-grain pasta. Brown rice. Bulgar wheat. Polenta. Couscous. Whole-wheat bread. [place]. ? Vegetables: Artichokes. Beets. Broccoli. Cabbage. Carrots. Eggplant. Green beans. Chard. Kale. Spinach. Onions. Leeks. Peas. Squash.  Tomatoes. Peppers. Radishes. ? Fruits: Apples. Apricots. Avocado. Berries. Bananas. Cherries. Dates. Figs. Grapes. Lemons. Melon. Oranges. Peaches. Plums. Pomegranate. ? Meats and other protein foods: Beans. Almonds. Sunflower seeds. Pine nuts. Peanuts. [place]. Salmon. Scallops. Shrimp. [place]. Tilapia. Clams. Oysters. Eggs. ? Dairy: Low-fat milk. Cheese. Greek yogurt. ? Beverages: Water. Red wine. Herbal tea. ? Fats and oils: Extra virgin olive oil. Avocado oil. Grape seed oil. ? Sweets and desserts: [place] yogurt with honey. Baked apples. Poached pears. Trail mix. ? Seasoning and other foods: Basil. Cilantro. Coriander. Cumin. Mint. Parsley. Sage. Rosemary. Tarragon. Garlic. Oregano. Thyme. Pepper. Balsalmic vinegar. Tahini. Hummus. Tomato sauce. Olives. Mushrooms. ? Limit these ? Grains: Prepackaged pasta or rice dishes. Prepackaged cereal with added sugar. ? Vegetables: Deep fried potatoes (french fries). ? Fruits: Fruit canned in syrup. ? Meats and other protein foods: Beef. Pork. Lamb. Poultry with skin. Hot dogs. Berniece Salines. ? Dairy: Ice cream. Sour cream. Whole milk. ? Beverages: Juice. Sugar-sweetened soft drinks. Beer. Liquor and spirits. ? Fats and oils: Butter. Canola oil. Vegetable oil. Beef fat (tallow). Lard. ? Sweets and desserts: Cookies. Cakes. Pies. Candy. ? Seasoning and other foods: Mayonnaise. Premade sauces and marinades. ? The items listed may not be a complete list. Talk with your dietitian about what dietary choices are right for you. Summary  The Mediterranean diet includes both food and lifestyle choices.  Eat a variety of fresh fruits and vegetables, beans, nuts, seeds, and whole grains.  Limit the amount of red meat and sweets that you eat.  Talk with your health care provider about whether it is safe for you to drink red wine in moderation. This means 1 glass a day for nonpregnant women and 2 glasses a day for men. A glass of wine equals 5 oz (150 mL). This information  is not intended to replace advice given to you by your health care provider. Make sure you discuss any questions you have with your health care provider. Document Released: 11/16/2015 Document Revised: 12/19/2015 Document Reviewed: 11/16/2015 Elsevier Interactive Patient Education  2018 [place] refers to food and lifestyle choices that are based on the traditions of countries located on the [employer]. This way of eating has been shown to help prevent certain conditions and improve outcomes for people who have chronic diseases, like kidney disease and heart disease. What are tips for following this plan? Lifestyle  Cook and eat meals together with your family, when possible.  Drink enough fluid to keep your urine clear or pale yellow.  Be physically active every day. This includes: ? Aerobic exercise like running or swimming. ? Leisure activities like gardening, walking, or housework.  Get 7-8 hours of sleep each night.  If recommended by your health care provider, drink red wine in moderation. This means 1 glass a day for nonpregnant women and 2 glasses a day for men. A glass of wine equals 5 oz (150 mL). Reading food labels  Check the serving size of packaged foods. For foods such as rice and pasta,  the serving size refers to the amount of cooked product, not dry.  Check the total fat in packaged foods. Avoid foods that have saturated fat or trans fats.  Check the ingredients list for added sugars, such as corn syrup. Shopping  At the grocery store, buy most of your food from the areas near the walls of the store. This includes: ? Fresh fruits and vegetables (produce). ? Grains, beans, nuts, and seeds. Some of these may be available in unpackaged forms or large amounts (in bulk). ? Fresh seafood. ? Poultry and eggs. ? Low-fat dairy products.  Buy whole ingredients instead of prepackaged foods.  Buy fresh fruits and  vegetables in-season from local farmers markets.  Buy frozen fruits and vegetables in resealable bags.  If you do not have access to quality fresh seafood, buy precooked frozen shrimp or canned fish, such as tuna, salmon, or sardines.  Buy small amounts of raw or cooked vegetables, salads, or olives from the deli or salad bar at your store.  Stock your pantry so you always have certain foods on hand, such as olive oil, canned tuna, canned tomatoes, rice, pasta, and beans. Cooking  Cook foods with extra-virgin olive oil instead of using butter or other vegetable oils.  Have meat as a side dish, and have vegetables or grains as your main dish. This means having meat in small portions or adding small amounts of meat to foods like pasta or stew.  Use beans or vegetables instead of meat in common dishes like chili or lasagna.  Experiment with different cooking methods. Try roasting or broiling vegetables instead of steaming or sauteing them.  Add frozen vegetables to soups, stews, pasta, or rice.  Add nuts or seeds for added healthy fat at each meal. You can add these to yogurt, salads, or vegetable dishes.  Marinate fish or vegetables using olive oil, lemon juice, garlic, and fresh herbs. Meal planning  Plan to eat 1 vegetarian meal one day each week. Try to work up to 2 vegetarian meals, if possible.  Eat seafood 2 or more times a week.  Have healthy snacks readily available, such as: ? Vegetable sticks with hummus. ? [place] yogurt. ? Fruit and nut trail mix.  Eat balanced meals throughout the week. This includes: ? Fruit: 2-3 servings a day ? Vegetables: 4-5 servings a day ? Low-fat dairy: 2 servings a day ? Fish, poultry, or lean meat: 1 serving a day ? Beans and legumes: 2 or more servings a week ? Nuts and seeds: 1-2 servings a day ? Whole grains: 6-8 servings a day ? Extra-virgin olive oil: 3-4 servings a day  Limit red meat and sweets to only a few servings a  month What are my food choices?  Mediterranean diet ? Recommended ? Grains: Whole-grain pasta. Brown rice. Bulgar wheat. Polenta. Couscous. Whole-wheat bread. [place]. ? Vegetables: Artichokes. Beets. Broccoli. Cabbage. Carrots. Eggplant. Green beans. Chard. Kale. Spinach. Onions. Leeks. Peas. Squash. Tomatoes. Peppers. Radishes. ? Fruits: Apples. Apricots. Avocado. Berries. Bananas. Cherries. Dates. Figs. Grapes. Lemons. Melon. Oranges. Peaches. Plums. Pomegranate. ? Meats and other protein foods: Beans. Almonds. Sunflower seeds. Pine nuts. Peanuts. [place]. Salmon. Scallops. Shrimp. [place]. Tilapia. Clams. Oysters. Eggs. ? Dairy: Low-fat milk. Cheese. Greek yogurt. ? Beverages: Water. Red wine. Herbal tea. ? Fats and oils: Extra virgin olive oil. Avocado oil. Grape seed oil. ? Sweets and desserts: [place] yogurt with honey. Baked apples. Poached pears. Trail mix. ? Seasoning and other foods: Basil. Cilantro. Coriander. Cumin. Mint.  Parsley. Sage. Rosemary. Tarragon. Garlic. Oregano. Thyme. Pepper. Balsalmic vinegar. Tahini. Hummus. Tomato sauce. Olives. Mushrooms. ? Limit these ? Grains: Prepackaged pasta or rice dishes. Prepackaged cereal with added sugar. ? Vegetables: Deep fried potatoes (french fries). ? Fruits: Fruit canned in syrup. ? Meats and other protein foods: Beef. Pork. Lamb. Poultry with skin. Hot dogs. [place] Salines. ? Dairy: Ice cream. Sour cream. Whole milk. ? Beverages: Juice. Sugar-sweetened soft drinks. Beer. Liquor and spirits. ? Fats and oils: Butter. Canola oil. Vegetable oil. Beef fat (tallow). Lard. ? Sweets and desserts: Cookies. Cakes. Pies. Candy. ? Seasoning and other foods: Mayonnaise. Premade sauces and marinades. ? The items listed may not be a complete list. Talk with your dietitian about what dietary choices are right for you. Summary  The Mediterranean diet includes both food and lifestyle choices.  Eat a variety of fresh fruits and vegetables, beans, nuts,  seeds, and whole grains.  Limit the amount of red meat and sweets that you eat.  Talk with your health care provider about whether it is safe for you to drink red wine in moderation. This means 1 glass a day for nonpregnant women and 2 glasses a day for men. A glass of wine equals 5 oz (150 mL). This information is not intended to replace advice given to you by your health care provider. Make sure you discuss any questions you have with your health care provider. Document Released: 11/16/2015 Document Revised: 12/19/2015 Document Reviewed: 11/16/2015 Elsevier Interactive Patient Education  2018 [place] all medications as directed. Thyroid panel drawn today- referral to Endocrinology placed. Increase water intake and follow Mediterranean diet. Please start Hydrochlorothiazide 25mg  once daily. Please use Ketoconazole shampoo as directed to treat reddened skin underneath breasts. Follow-up with Dr. Debara Pickett, re: blood pressure and shortness of breath  Referral to Endocrinology placed Follow-up 6 weeks- labs/Blood pressure check NICE TO SEE YOU!

## 2017-10-29 NOTE — Assessment & Plan Note (Signed)
BP above goal 153/81, HR 76 Bil ankle edema Continue on Diltiazem 360mg  QD and start on HCTZ 25mg  QD

## 2017-10-29 NOTE — Assessment & Plan Note (Signed)
Advised to follow- up with Dr. Debara Pickett She continues to deny hx of cardiac cath

## 2017-10-29 NOTE — Assessment & Plan Note (Signed)
04/25/17 TSH- 51 Levothyroxine increased, advised to f/u for labs 2/19, she did not She has been off Levothyroxine 257mcg >3 weeks Thyroid studies ordered today, Endocrinology referral placed

## 2017-10-29 NOTE — Assessment & Plan Note (Signed)
Declined CBT referral She denies thoughts of harming herself/others

## 2017-10-29 NOTE — Assessment & Plan Note (Signed)
The 10-year ASCVD risk score Mikey Bussing DC Brooke Bonito., et al., 2013) is: 22.1%   Values used to calculate the score:     Age: 72 years     Sex: Female     Is Non-Hispanic African American: No     Diabetic: No     Tobacco smoker: No     Systolic Blood Pressure: 774 mmHg     Is BP treated: Yes     HDL Cholesterol: 47 mg/dL     Total Cholesterol: 217 mg/dL  LDL-137 She only used statin Jan - feb, self d/c'd Advised to resume, she refused Advised to follow heart healthy diet and increase regular exercise

## 2017-10-30 ENCOUNTER — Other Ambulatory Visit: Payer: Self-pay | Admitting: Adult Health

## 2017-10-30 DIAGNOSIS — E079 Disorder of thyroid, unspecified: Secondary | ICD-10-CM

## 2017-10-30 LAB — COMPREHENSIVE METABOLIC PANEL
ALT: 23 IU/L (ref 0–32)
AST: 17 IU/L (ref 0–40)
Albumin/Globulin Ratio: 1.6 (ref 1.2–2.2)
Albumin: 4.4 g/dL (ref 3.5–4.8)
Alkaline Phosphatase: 138 IU/L — ABNORMAL HIGH (ref 39–117)
BUN/Creatinine Ratio: 15 (ref 12–28)
BUN: 15 mg/dL (ref 8–27)
Bilirubin Total: 0.3 mg/dL (ref 0.0–1.2)
CO2: 22 mmol/L (ref 20–29)
Calcium: 9.4 mg/dL (ref 8.7–10.3)
Chloride: 103 mmol/L (ref 96–106)
Creatinine, Ser: 0.98 mg/dL (ref 0.57–1.00)
GFR calc Af Amer: 67 mL/min/{1.73_m2} (ref >59)
GFR calc non Af Amer: 58 mL/min/{1.73_m2} — ABNORMAL LOW (ref >59)
Globulin, Total: 2.8 g/dL (ref 1.5–4.5)
Glucose: 91 mg/dL (ref 65–99)
Potassium: 4.4 mmol/L (ref 3.5–5.2)
Sodium: 143 mmol/L (ref 134–144)
Total Protein: 7.2 g/dL (ref 6.0–8.5)

## 2017-10-30 LAB — TSH: TSH: 13.3 u[IU]/mL — ABNORMAL HIGH (ref 0.450–4.500)

## 2017-10-30 LAB — T3: T3, Total: 61 ng/dL — ABNORMAL LOW (ref 71–180)

## 2017-10-30 LAB — THYROID ANTIBODIES
Thyroglobulin Antibody: <1 IU/mL (ref 0.0–0.9)
Thyroperoxidase Ab SerPl-aCnc: 15 IU/mL (ref 0–34)

## 2017-10-30 LAB — T4, FREE: Free T4: 1.35 ng/dL (ref 0.82–1.77)

## 2017-10-30 MED ORDER — LEVOTHYROXINE SODIUM 25 MCG PO TABS: 25.0000 ug | ORAL_TABLET | Freq: Every day | ORAL | 0 refills | Status: DC

## 2017-10-30 MED ORDER — LEVOTHYROXINE SODIUM 200 MCG PO TABS
200.0000 ug | ORAL_TABLET | Freq: Every day | ORAL | 0 refills | Status: DC
Start: 2017-10-30 — End: 2017-12-03

## 2017-11-26 ENCOUNTER — Other Ambulatory Visit: Payer: Self-pay | Admitting: Family Medicine

## 2017-11-26 NOTE — Telephone Encounter (Signed)
Please review for appropriateness of refill.  I am unsure if pt is to continue this medication.  It was marked "completed course" 10/2017 at [facility].  Charyl Bigger, CMA

## 2017-11-27 NOTE — Telephone Encounter (Signed)
Good Morning [name], Her last Vit d was 32.5 on 04/25/17 To keep her normal range, will RF Thanks! Valetta Fuller

## 2017-12-02 ENCOUNTER — Ambulatory Visit: Payer: Medicare Other | Admitting: Adult Health

## 2017-12-02 ENCOUNTER — Encounter: Payer: Self-pay | Admitting: Adult Health

## 2017-12-02 VITALS — BP 138/76 | HR 73 | Ht 64.0 in | Wt 222.4 lb

## 2017-12-02 DIAGNOSIS — Z0000 Encounter for general adult medical examination without abnormal findings: Secondary | ICD-10-CM

## 2017-12-02 DIAGNOSIS — F411 Generalized anxiety disorder: Secondary | ICD-10-CM | POA: Diagnosis not present

## 2017-12-02 DIAGNOSIS — R5383 Other fatigue: Secondary | ICD-10-CM | POA: Diagnosis not present

## 2017-12-02 DIAGNOSIS — E079 Disorder of thyroid, unspecified: Secondary | ICD-10-CM

## 2017-12-02 NOTE — Assessment & Plan Note (Addendum)
TSH drawn today Currently on Levothyroxine 212.5mg  AQM Discuss fatigue and snoring at Pulmonology appt, recommend Sleep Study

## 2017-12-02 NOTE — Patient Instructions (Addendum)
Hypothyroidism Hypothyroidism is a disorder of the thyroid. The thyroid is a large gland that is located in the lower front of the neck. The thyroid releases hormones that control how the body works. With hypothyroidism, the thyroid does not make enough of these hormones. What are the causes? Causes of hypothyroidism may include:  Viral infections.  Pregnancy.  Your own defense system (immune system) attacking your thyroid.  Certain medicines.  Birth defects.  Past radiation treatments to your head or neck.  Past treatment with radioactive iodine.  Past surgical removal of part or all of your thyroid.  Problems with the gland that is located in the center of your brain (pituitary).  What are the signs or symptoms? Signs and symptoms of hypothyroidism may include:  Feeling as though you have no energy (lethargy).  Inability to tolerate cold.  Weight gain that is not explained by a change in diet or exercise habits.  Dry skin.  Coarse hair.  Menstrual irregularity.  Slowing of thought processes.  Constipation.  Sadness or depression.  How is this diagnosed? Your health care provider may diagnose hypothyroidism with blood tests and ultrasound tests. How is this treated? Hypothyroidism is treated with medicine that replaces the hormones that your body does not make. After you begin treatment, it may take several weeks for symptoms to go away. Follow these instructions at home:  Take medicines only as directed by your health care provider.  If you start taking any new medicines, tell your health care provider.  Keep all follow-up visits as directed by your health care provider. This is important. As your condition improves, your dosage needs may change. You will need to have blood tests regularly so that your health care provider can watch your condition. Contact a health care provider if:  Your symptoms do not get better with treatment.  You are taking thyroid  replacement medicine and: ? You sweat excessively. ? You have tremors. ? You feel anxious. ? You lose weight rapidly. ? You cannot tolerate heat. ? You have emotional swings. ? You have diarrhea. ? You feel weak. Get help right away if:  You develop chest pain.  You develop an irregular heartbeat.  You develop a rapid heartbeat. This information is not intended to replace advice given to you by your health care provider. Make sure you discuss any questions you have with your health care provider. Document Released: 03/25/2005 Document Revised: 08/31/2015 Document Reviewed: 08/10/2013 Elsevier Interactive Patient Education  2018 [employer].  Once your TSH level results we will call you and fill Levothyroxine rx. Stay well hydrated and eat small, well balanced meals. Please keep follow-up with Pulmonology, re: fatigue, snoring, and sleep study evaluation. Please keep follow-up with Rheumatology, re: fibromyalgia. Follow-up in 6 months for Medicare Wellness NICE TO SEE YOU!

## 2017-12-02 NOTE — Assessment & Plan Note (Signed)
Hx of medication non-compliance and irregular thyroid monitoring has made it quite difficult to regulate thyroid disorder. Referral to Endocrinology placed last OV TSH drawn today Currently on Levothyroxine 212.5mg  AQM If TSH is not within range, will insist on her establishing with Endo

## 2017-12-02 NOTE — Progress Notes (Signed)
Subjective:    Patient ID: Kayla [name], female    DOB: [birth year], 72 y.o.   MRN: [id number]  HPI:  Kayla [name] is here TSH lab draw but wanted to discuss several issues. 1) Extreme fatigue.  She reports taking Levothyroxine 212.5mg  each morning only with water and with any other medications. Referral to Endocrinology placed, however she states "I want to hold off on calling office for appt until one more thyroid level is checked". She has sig hx of snoring and has never had Sleep Study- she has upcoming appt with Pulmonology next week. 2) Increased diffuse fibromyalgia pain, she has f/u with Rheumatology for injection therapy. 3) "Felling stuffed after eating anything, and I usually do not eat much".  She reports this occurring for last 3 weeks.  She reports last EDG and Colonoscopy >15 years ago. She has hx of GERD, IBS, and iron def anemia She declined referral to GI at this time 4) Sig life stressors- multiple family members that live with her and are in poor health and fnancial hardships.  She declines SSRI or referral to CBT She denies thoughts of harming herself/others She denies regular exercise and abstains from tobacco/ETOH use   Patient Care Team    Relationship Specialty Notifications Start End  Mina Marble D, NP PCP - General Family Medicine  11/14/16   Allyn Kenner, MD  Dermatology  12/20/15   Pixie Casino, MD Consulting Physician Cardiology  01/01/16   Lahoma Rocker, MD Referring Physician Rheumatology  01/01/16    Comment: fibromyalgia/  and osteopania and scolisois  [facility]    08/04/17     Patient Active Problem List   Diagnosis Date Noted  . Hematuria 04/28/2017  . Multiple atypical skin moles 04/28/2017  . Swollen lymph nodes 04/28/2017  . Screening for colon cancer 04/28/2017  . Healthcare maintenance 04/28/2017  . Shakiness 11/14/2016  . Otitis media of left ear 09/11/2016  . Depression, recurrent ([place]) 07/16/2016  . Adjustment disorder  with mixed anxiety and depressed mood 01/15/2016  . Fe Def Anemia 12/22/2015  . Scoliosis (and kyphoscoliosis), idiopathic 12/20/2015  . Osteopenia 12/20/2015  . PCP NOTES >>> 01/22/2015  . Fibromyalgia 08/02/2014  . DOE (dyspnea on exertion)- chronic 07/07/2014  . GAD (generalized anxiety disorder) 09/17/2012  . UTI (lower urinary tract infection) 01/24/2012  . Vitamin D deficiency 01/24/2012  . Insomnia 01/24/2012  . Fatigue 10/14/2011  . Neck pain 07/23/2010  . Screening for breast cancer 07/23/2010  . B12 deficiency   . Hyperlipidemia 11/30/2007  . CAD (coronary artery disease) 11/30/2007  . PALPITATIONS, OCCASIONAL 04/15/2007  . Thyroid disease 01/27/2007  . Essential hypertension 01/27/2007  . Reactive airway disease that is not asthma 01/27/2007  . GERD 01/27/2007  . IBS 01/27/2007  . BACK PAIN, CHRONIC 01/27/2007     Past Medical History:  Diagnosis Date  . Anemia   . Asthma   . B12 deficiency   . Chronic LBP   . Diastolic dysfunction   . Fibromyalgia   . GERD (gastroesophageal reflux disease)   . Hyperlipemia   . Hypertension   . Hypothyroid   . IBS (irritable bowel syndrome)   . S/P cardiac cath 11/30/2007   normal coronaries - Dr. Burt Knack (Dr. Debara Pickett reviewed films on 01/10/2016)     Past Surgical History:  Procedure Laterality Date  . CESAREAN SECTION    . CHOLECYSTECTOMY    . [place]     Family History  Problem Relation Age of Onset  . Heart attack Father 14  . Lung cancer Unknown        uncle, non smoker  . Leukemia Unknown        uncle  . Cancer Mother        oral  . Stroke Mother   . Cancer Maternal Aunt        breast  . Alcohol abuse Maternal Aunt   . Alcohol abuse Maternal Uncle   . Cancer Maternal Uncle        lung, stomach, oral  . Cancer Paternal Uncle        lung  . Congestive Heart Failure Maternal Grandfather   . Heart attack Paternal Grandfather   . Cancer Paternal Uncle        bone  . Colon cancer Neg Hx   .  Breast cancer Neg Hx   . Diabetes Neg Hx      Social History   Substance and Sexual Activity  Drug Use No     Social History   Substance and Sexual Activity  Alcohol Use No     Social History   Tobacco Use  Smoking Status Never Smoker  Smokeless Tobacco Never Used     Outpatient Encounter Medications as of 12/02/2017  Medication Sig  . [facility] VITAMIN B12 1000 MCG tablet TAKE 1 TABLET (1,000 MCG TOTAL) BY MOUTH DAILY.  . hydrochlorothiazide (HYDRODIURIL) 25 MG tablet Take 1 tablet (25 mg total) by mouth daily.  Marland Kitchen ketoconazole (NIZORAL) 2 % shampoo Apply 1 application topically 2 (two) times a week.  . levothyroxine (SYNTHROID, LEVOTHROID) 200 MCG tablet Take 1 tablet (200 mcg total) by mouth daily before breakfast.  . levothyroxine (SYNTHROID, LEVOTHROID) 25 MCG tablet Take 1 tablet (25 mcg total) by mouth daily before breakfast. 1/2 tablet daily. To be taken with Levothyroxine 214mcg to total 212.5mg  each am  . magnesium 30 MG tablet Take 30 mg by mouth daily.  Marland Kitchen MATZIM LA 360 MG 24 hr tablet TAKE 1 TABLET (360 MG TOTAL) BY MOUTH DAILY. (FUTURE RF'S PER CARDS- DR HILTY)  . omega-3 acid ethyl esters (LOVAZA) 1 g capsule TAKE 2 CAPSULES (2 G TOTAL) BY MOUTH 2 (TWO) TIMES DAILY.  Marland Kitchen Potassium 75 MG TABS Take 1 tablet daily by mouth.   . Vitamin D, Ergocalciferol, (DRISDOL) 50000 units CAPS capsule TAKE ONE CAPSULE BY MOUTH ONE TIME PER WEEK   No facility-administered encounter medications on file as of 12/02/2017.     Allergies: Ace inhibitors and Lisinopril  Body mass index is 38.17 kg/m.  Blood pressure 138/76, pulse 73, height 5\' 4"  (1.626 m), weight 222 lb 6.4 oz (100.9 kg), SpO2 96 %.   Review of Systems  Constitutional: Positive for fatigue. Negative for activity change, appetite change, chills, diaphoresis, fever and unexpected weight change.  Eyes: Negative for visual disturbance.  Respiratory: Positive for cough and shortness of breath. Negative for chest  tightness, wheezing and stridor.   Cardiovascular: Negative for chest pain, palpitations and leg swelling.  Gastrointestinal: Negative for abdominal distention, abdominal pain, blood in stool, constipation, diarrhea, nausea and vomiting.  Genitourinary: Negative for difficulty urinating and flank pain.  Musculoskeletal: Positive for arthralgias, back pain, gait problem, joint swelling, myalgias, neck pain and neck stiffness.  Neurological: Negative for dizziness.  Hematological: Does not bruise/bleed easily.  Psychiatric/Behavioral: Positive for dysphoric mood and sleep disturbance. Negative for behavioral problems, confusion, decreased concentration, hallucinations, self-injury and suicidal ideas. The patient is nervous/anxious. The patient  is not hyperactive.        Objective:   Physical Exam  Constitutional: She is oriented to person, place, and time. She appears well-developed and well-nourished. No distress.  HENT:  Head: Normocephalic and atraumatic.  Right Ear: External ear normal.  Left Ear: External ear normal.  Nose: Nose normal.  Mouth/Throat: Oropharynx is clear and moist.  Cardiovascular: Normal rate, regular rhythm, normal heart sounds and intact distal pulses.  No murmur heard. Pulmonary/Chest: Effort normal and breath sounds normal. No stridor. No respiratory distress. She has no wheezes. She has no rales. She exhibits no tenderness.  Neurological: She is alert and oriented to person, place, and time.  Skin: Skin is warm and dry. Capillary refill takes less than 2 seconds. No rash noted. She is not diaphoretic. No erythema. No pallor.  Psychiatric: She has a normal mood and affect. Her behavior is normal. Judgment and thought content normal.  Nursing note and vitals reviewed.     Assessment & Plan:   1. Healthcare maintenance   2. Thyroid disease   3. GAD (generalized anxiety disorder)   4. Fatigue, unspecified type     Healthcare maintenance Once your TSH level  results we will call you and fill Levothyroxine rx. Stay well hydrated and eat small, well balanced meals. Please keep follow-up with Pulmonology, re: fatigue, snoring, and sleep study evaluation. Please keep follow-up with Rheumatology, re: fibromyalgia. Follow-up in 6 months for Medicare Wellness  GAD (generalized anxiety disorder) Declined SSRI or CBT referral   Fatigue TSH drawn today Currently on Levothyroxine 212.5mg  AQM Discuss fatigue and snoring at Pulmonology appt, recommend Sleep Study  Thyroid disease Hx of medication non-compliance and irregular thyroid monitoring has made it quite difficult to regulate thyroid disorder. Referral to Endocrinology placed last OV TSH drawn today Currently on Levothyroxine 212.5mg  AQM If TSH is not within range, will insist on her establishing with Endo    FOLLOW-UP:  Return in about 6 months (around 06/04/2018) for Medical Wellness.

## 2017-12-02 NOTE — Assessment & Plan Note (Signed)
Declined SSRI or CBT referral

## 2017-12-02 NOTE — Assessment & Plan Note (Signed)
Once your TSH level results we will call you and fill Levothyroxine rx. Stay well hydrated and eat small, well balanced meals. Please keep follow-up with Pulmonology, re: fatigue, snoring, and sleep study evaluation. Please keep follow-up with Rheumatology, re: fibromyalgia. Follow-up in 6 months for Medicare Wellness

## 2017-12-03 ENCOUNTER — Telehealth: Payer: Self-pay

## 2017-12-03 ENCOUNTER — Other Ambulatory Visit: Payer: Self-pay | Admitting: Adult Health

## 2017-12-03 LAB — TSH: TSH: 0.175 u[IU]/mL — ABNORMAL LOW (ref 0.450–4.500)

## 2017-12-03 MED ORDER — LEVOTHYROXINE SODIUM 200 MCG PO TABS: 200.0000 ug | ORAL_TABLET | Freq: Every day | ORAL | 0 refills | Status: DC

## 2017-12-03 MED ORDER — LEVOTHYROXINE SODIUM 25 MCG PO TABS: ORAL_TABLET | ORAL | 0 refills | Status: DC

## 2017-12-03 NOTE — Telephone Encounter (Signed)
Pt informed of lab results.  Pt request refill on levothyroxine 255mcg.  Rx sent to pharmacy.  Charyl Bigger, CMA

## 2017-12-11 ENCOUNTER — Institutional Professional Consult (permissible substitution): Payer: Medicare Other | Admitting: Emergency Medicine

## 2017-12-19 ENCOUNTER — Other Ambulatory Visit: Payer: Self-pay | Admitting: Internal Medicine

## 2017-12-22 NOTE — Telephone Encounter (Signed)
Diltiazem refill refused. Please defer to PCP or patient needs appt. Last visit > 3 years ago.

## 2018-01-19 ENCOUNTER — Other Ambulatory Visit: Payer: Self-pay | Admitting: Internal Medicine

## 2018-02-10 ENCOUNTER — Other Ambulatory Visit: Payer: Self-pay | Admitting: Internal Medicine

## 2018-02-10 NOTE — Telephone Encounter (Signed)
Pt has not been seen by Dr. Debara Pickett since 2016. Needs appointment.

## 2018-02-11 ENCOUNTER — Encounter: Payer: Self-pay | Admitting: Endocrinology

## 2018-03-02 ENCOUNTER — Encounter (HOSPITAL_BASED_OUTPATIENT_CLINIC_OR_DEPARTMENT_OTHER): Payer: Self-pay

## 2018-03-02 ENCOUNTER — Emergency Department (HOSPITAL_BASED_OUTPATIENT_CLINIC_OR_DEPARTMENT_OTHER)
Admission: EM | Admit: 2018-03-02 | Discharge: 2018-03-02 | Disposition: A | Discharge status: Home / Self Care | Payer: Medicare Other | Attending: Emergency Medicine | Admitting: Emergency Medicine

## 2018-03-02 ENCOUNTER — Other Ambulatory Visit: Payer: Self-pay

## 2018-03-02 DIAGNOSIS — E785 Hyperlipidemia, unspecified: Secondary | ICD-10-CM | POA: Diagnosis not present

## 2018-03-02 DIAGNOSIS — J45909 Unspecified asthma, uncomplicated: Secondary | ICD-10-CM | POA: Diagnosis not present

## 2018-03-02 DIAGNOSIS — I2510 Atherosclerotic heart disease of native coronary artery without angina pectoris: Secondary | ICD-10-CM | POA: Diagnosis not present

## 2018-03-02 DIAGNOSIS — K029 Dental caries, unspecified: Secondary | ICD-10-CM | POA: Diagnosis not present

## 2018-03-02 DIAGNOSIS — I10 Essential (primary) hypertension: Secondary | ICD-10-CM | POA: Diagnosis not present

## 2018-03-02 DIAGNOSIS — R220 Localized swelling, mass and lump, head: Secondary | ICD-10-CM | POA: Diagnosis present

## 2018-03-02 DIAGNOSIS — Z79899 Other long term (current) drug therapy: Secondary | ICD-10-CM | POA: Diagnosis not present

## 2018-03-02 LAB — CBC
HCT: 45.5 % (ref 36.0–46.0)
Hemoglobin: 14.5 g/dL (ref 12.0–15.0)
MCH: 31.5 pg (ref 26.0–34.0)
MCHC: 31.9 g/dL (ref 30.0–36.0)
MCV: 98.7 fL (ref 80.0–100.0)
Platelets: 349 10*3/uL (ref 150–400)
RBC: 4.61 MIL/uL (ref 3.87–5.11)
RDW: 12.6 % (ref 11.5–15.5)
WBC: 12.5 10*3/uL — ABNORMAL HIGH (ref 4.0–10.5)
nRBC: 0 % (ref 0.0–0.2)

## 2018-03-02 LAB — COMPREHENSIVE METABOLIC PANEL
ALT: 12 U/L (ref 0–44)
AST: 16 U/L (ref 15–41)
Albumin: 4 g/dL (ref 3.5–5.0)
Alkaline Phosphatase: 83 U/L (ref 38–126)
Anion gap: 10 (ref 5–15)
BUN: 12 mg/dL (ref 8–23)
CO2: 21 mmol/L — ABNORMAL LOW (ref 22–32)
Calcium: 9.2 mg/dL (ref 8.9–10.3)
Chloride: 110 mmol/L (ref 98–111)
Creatinine, Ser: 0.82 mg/dL (ref 0.44–1.00)
GFR calc Af Amer: >60 mL/min (ref >60)
GFR calc non Af Amer: >60 mL/min (ref >60)
Glucose, Bld: 135 mg/dL — ABNORMAL HIGH (ref 70–99)
Potassium: 3.1 mmol/L — ABNORMAL LOW (ref 3.5–5.1)
Sodium: 141 mmol/L (ref 135–145)
Total Bilirubin: 0.6 mg/dL (ref 0.3–1.2)
Total Protein: 7.4 g/dL (ref 6.5–8.1)

## 2018-03-02 MED ORDER — ACETAMINOPHEN-CODEINE #3 300-30 MG PO TABS: 1.0000 | ORAL_TABLET | Freq: Four times a day (QID) | ORAL | 0 refills | Status: DC | PRN

## 2018-03-02 MED ORDER — CHLORHEXIDINE GLUCONATE 0.12% ORAL RINSE (MEDLINE KIT): 15.0000 mL | Freq: Two times a day (BID) | OROMUCOSAL | 0 refills | Status: DC

## 2018-03-02 MED ORDER — DIPHENHYDRAMINE HCL 50 MG/ML IJ SOLN
25.0000 mg | Freq: Once | INTRAMUSCULAR | Status: AC
  Administered 2018-03-02: 25 mg via INTRAVENOUS
  Filled 2018-03-02: qty 1

## 2018-03-02 MED ORDER — METHYLPREDNISOLONE SODIUM SUCC 125 MG IJ SOLR
125.0000 mg | Freq: Once | INTRAMUSCULAR | Status: AC
  Administered 2018-03-02: 125 mg via INTRAVENOUS
  Filled 2018-03-02: qty 2

## 2018-03-02 MED ORDER — CLINDAMYCIN PHOSPHATE 600 MG/50ML IV SOLN
600.0000 mg | Freq: Once | INTRAVENOUS | Status: AC
  Administered 2018-03-02: 600 mg via INTRAVENOUS
  Filled 2018-03-02: qty 50

## 2018-03-02 MED ORDER — CLINDAMYCIN HCL 300 MG PO CAPS: 300.0000 mg | ORAL_CAPSULE | Freq: Four times a day (QID) | ORAL | 0 refills | Status: DC

## 2018-03-02 MED ORDER — SODIUM CHLORIDE 0.9 % IV SOLN: INTRAVENOUS | Status: DC

## 2018-03-02 MED ORDER — SODIUM CHLORIDE 0.9 % IV BOLUS
1000.0000 mL | Freq: Once | INTRAVENOUS | Status: AC
  Administered 2018-03-02: 1000 mL via INTRAVENOUS

## 2018-03-02 MED ORDER — FAMOTIDINE 20 MG PO TABS
20.0000 mg | ORAL_TABLET | Freq: Once | ORAL | Status: AC
Start: 2018-03-02 — End: 2018-03-02
  Administered 2018-03-02: 20 mg via ORAL
  Filled 2018-03-02: qty 1

## 2018-03-02 MED FILL — CLINDAMYCIN HCL 300 MG CAP: 300 | 7 days supply | Qty: 28 | Fill #0

## 2018-03-02 MED FILL — CHLORHEXIDINE 0.12% RINSE: 0.12 | 16 days supply | Qty: 473 | Fill #0

## 2018-03-02 NOTE — ED Provider Notes (Signed)
[facility] EMERGENCY DEPARTMENT Provider Note   CSN: [id number] Arrival date & time: 03/02/18  1303     History   Chief Complaint Chief Complaint  Patient presents with  . Facial Swelling    HPI Kayla [name] is a 72 y.o. female.  Pt presents to the ED today with facial swelling.  She has had dental problems for about 1 month. She said she can't afford to see the dentist.  She developed a worsening toothache yesterday and took a leftover doxycycline.  The pt woke up this morning with lip swelling.  She did not take any benadryl or any other allergy medication.  She did have some sob this morning, but this is gone now.       Past Medical History:  Diagnosis Date  . Anemia   . Asthma   . B12 deficiency   . Chronic LBP   . Diastolic dysfunction   . Fibromyalgia   . GERD (gastroesophageal reflux disease)   . Hyperlipemia   . Hypertension   . Hypothyroid   . IBS (irritable bowel syndrome)   . S/P cardiac cath 11/30/2007   normal coronaries - Dr. Burt Knack (Dr. Debara Pickett reviewed films on 01/10/2016)    Patient Active Problem List   Diagnosis Date Noted  . Hematuria 04/28/2017  . Multiple atypical skin moles 04/28/2017  . Swollen lymph nodes 04/28/2017  . Screening for colon cancer 04/28/2017  . Healthcare maintenance 04/28/2017  . Shakiness 11/14/2016  . Otitis media of left ear 09/11/2016  . Depression, recurrent ([place]) 07/16/2016  . Adjustment disorder with mixed anxiety and depressed mood 01/15/2016  . Fe Def Anemia 12/22/2015  . Scoliosis (and kyphoscoliosis), idiopathic 12/20/2015  . Osteopenia 12/20/2015  . PCP NOTES >>> 01/22/2015  . Fibromyalgia 08/02/2014  . DOE (dyspnea on exertion)- chronic 07/07/2014  . GAD (generalized anxiety disorder) 09/17/2012  . UTI (lower urinary tract infection) 01/24/2012  . Vitamin D deficiency 01/24/2012  . Insomnia 01/24/2012  . Fatigue 10/14/2011  . Neck pain 07/23/2010  . Screening for breast cancer 07/23/2010  .  B12 deficiency   . Hyperlipidemia 11/30/2007  . CAD (coronary artery disease) 11/30/2007  . PALPITATIONS, OCCASIONAL 04/15/2007  . Thyroid disease 01/27/2007  . Essential hypertension 01/27/2007  . Reactive airway disease that is not asthma 01/27/2007  . GERD 01/27/2007  . IBS 01/27/2007  . BACK PAIN, CHRONIC 01/27/2007    Past Surgical History:  Procedure Laterality Date  . CESAREAN SECTION    . CHOLECYSTECTOMY    . NECK SURGERY  1998     OB History   None      Home Medications    Prior to Admission medications   Medication Sig Start Date End Date Taking? Authorizing Provider  chlorhexidine gluconate, MEDLINE KIT, (PERIDEX) 0.12 % solution Use as directed 15 mLs in the mouth or throat 2 (two) times daily. 03/02/18   Isla Pence, MD  clindamycin (CLEOCIN) 300 MG capsule Take 1 capsule (300 mg total) by mouth every 6 (six) hours. 03/02/18   Isla Pence, MD  [facility] VITAMIN B12 1000 MCG tablet TAKE 1 TABLET (1,000 MCG TOTAL) BY MOUTH DAILY. 08/20/17   Opalski, Neoma Laming, DO  diltiazem (MATZIM LA) 360 MG 24 hr tablet Take 1 tablet (360 mg total) by mouth daily. <PLEASE MAKE APPOINTMENT FOR REFILLS> 02/11/18   Hilty, Nadean Corwin, MD  hydrochlorothiazide (HYDRODIURIL) 25 MG tablet Take 1 tablet (25 mg total) by mouth daily. 10/29/17   [name], NP  ketoconazole (NIZORAL) 2 % shampoo Apply 1 application topically 2 (two) times a week. 10/30/17   Danford, Valetta Fuller D, NP  levothyroxine (SYNTHROID, LEVOTHROID) 200 MCG tablet Take 1 tablet (200 mcg total) by mouth daily before breakfast. 12/03/17   Danford, Valetta Fuller D, NP  levothyroxine (SYNTHROID, LEVOTHROID) 25 MCG tablet 1/2 tablet daily to be taken with 200 mcg tablet on tuesdays and thursdays only 12/03/17   Danford, Valetta Fuller D, NP  magnesium 30 MG tablet Take 30 mg by mouth daily.    [provider]  omega-3 acid ethyl esters (LOVAZA) 1 g capsule TAKE 2 CAPSULES (2 G TOTAL) BY MOUTH 2 (TWO) TIMES DAILY. 07/08/17   Danford, Katy D, NP    Potassium 75 MG TABS Take 1 tablet daily by mouth.     [provider]  Vitamin D, Ergocalciferol, (DRISDOL) 50000 units CAPS capsule TAKE ONE CAPSULE BY MOUTH ONE TIME PER WEEK 11/27/17   Danford, Berna Spare, NP    Family History Family History  Problem Relation Age of Onset  . Heart attack Father 36  . Lung cancer Unknown        uncle, non smoker  . Leukemia Unknown        uncle  . Cancer Mother        oral  . Stroke Mother   . Cancer Maternal Aunt        breast  . Alcohol abuse Maternal Aunt   . Alcohol abuse Maternal Uncle   . Cancer Maternal Uncle        lung, stomach, oral  . Cancer Paternal Uncle        lung  . Congestive Heart Failure Maternal Grandfather   . Heart attack Paternal Grandfather   . Cancer Paternal Uncle        bone  . Colon cancer Neg Hx   . Breast cancer Neg Hx   . Diabetes Neg Hx     Social History Social History   Tobacco Use  . Smoking status: Never Smoker  . Smokeless tobacco: Never Used  Substance Use Topics  . Alcohol use: No  . Drug use: No     Allergies   Ace inhibitors and Lisinopril   Review of Systems Review of Systems  HENT: Positive for dental problem and facial swelling.   All other systems reviewed and are negative.    Physical Exam Updated Vital Signs BP (!) 139/94   Pulse 90   Temp 98.7 F (37.1 C) (Oral)   Resp (!) 24   SpO2 96%   Physical Exam  Constitutional: She is oriented to person, place, and time. She appears well-developed and well-nourished.  HENT:  Head: Atraumatic.  Right Ear: External ear normal.  Left Ear: External ear normal.  Nose: Nose normal.  Bilateral upper and lower lip swelling. Dental caries noted.  No abscess to drain.  Eyes: Pupils are equal, round, and reactive to light. Conjunctivae and EOM are normal.  Neck: Normal range of motion. Neck supple.  Cardiovascular: Regular rhythm, normal heart sounds and intact distal pulses. Tachycardia present.  Pulmonary/Chest: Effort  normal and breath sounds normal.  Abdominal: Soft. Bowel sounds are normal.  Musculoskeletal: Normal range of motion.  Neurological: She is alert and oriented to person, place, and time.  Skin: Skin is warm. Capillary refill takes less than 2 seconds.  Psychiatric: She has a normal mood and affect. Her behavior is normal. Judgment and thought content normal.  Nursing note and vitals reviewed.  ED Treatments / Results  Labs (all labs ordered are listed, but only abnormal results are displayed) Labs Reviewed  CBC - Abnormal; Notable for the following components:      Result Value   WBC 12.5 (*)    All other components within normal limits  COMPREHENSIVE METABOLIC PANEL - Abnormal; Notable for the following components:   Potassium 3.1 (*)    CO2 21 (*)    Glucose, Bld 135 (*)    All other components within normal limits    EKG None  Radiology No results found.  Procedures Procedures (including critical care time)  Medications Ordered in ED Medications  sodium chloride 0.9 % bolus 1,000 mL ( Intravenous Stopped 03/02/18 1438)    And  0.9 %  sodium chloride infusion (has no administration in time range)  diphenhydrAMINE (BENADRYL) injection 25 mg (25 mg Intravenous Given 03/02/18 1337)  methylPREDNISolone sodium succinate (SOLU-MEDROL) 125 mg/2 mL injection 125 mg (125 mg Intravenous Given 03/02/18 1337)  famotidine (PEPCID) tablet 20 mg (20 mg Oral Given 03/02/18 1337)  clindamycin (CLEOCIN) IVPB 600 mg ( Intravenous Stopped 03/02/18 1434)     Initial Impression / Assessment and Plan / ED Course  I have reviewed the triage vital signs and the nursing notes.  Pertinent labs & imaging results that were available during my care of the patient were reviewed by me and considered in my medical decision making (see chart for details).     Pt is feeling much better.  Lip swelling is likely due to dental caries infection.  Pt given clindamycin IV here and will be d/c on  clinda and peridex mouth wash.  Pt instructed to f/u with dentist.  Return if worse.  Final Clinical Impressions(s) / ED Diagnoses   Final diagnoses:  Dental caries    ED Discharge Orders         Ordered    clindamycin (CLEOCIN) 300 MG capsule  Every 6 hours     03/02/18 1504    chlorhexidine gluconate, MEDLINE KIT, (PERIDEX) 0.12 % solution  2 times daily     03/02/18 1504           Isla Pence, MD 03/02/18 1506

## 2018-03-02 NOTE — ED Notes (Signed)
ED Provider at bedside. 

## 2018-03-02 NOTE — ED Triage Notes (Signed)
Pt c/o swelling to lips and tongue-started yesterday-states she also had a toothache and took leftover doxycycline-also c/o SOB this am when she woke-NAD-to triage in w/c

## 2018-03-09 ENCOUNTER — Institutional Professional Consult (permissible substitution): Payer: Medicare Other | Admitting: Emergency Medicine

## 2018-03-10 ENCOUNTER — Other Ambulatory Visit: Payer: Self-pay | Admitting: Internal Medicine

## 2018-03-17 ENCOUNTER — Telehealth: Payer: Self-pay | Admitting: Internal Medicine

## 2018-03-17 NOTE — Telephone Encounter (Signed)
I already responded to this - ok with me. I have not seen her since 2016, I think - so she is a new patient anyhow.  Dr. Lemmie Evens

## 2018-03-17 NOTE — Telephone Encounter (Signed)
New message    Patient wants to switch from Dr Debara Pickett to another physician in practice

## 2018-03-18 ENCOUNTER — Telehealth: Payer: Self-pay | Admitting: Internal Medicine

## 2018-03-18 ENCOUNTER — Telehealth: Payer: Self-pay | Admitting: Adult Health

## 2018-03-18 MED ORDER — DILTIAZEM HCL ER COATED BEADS 360 MG PO TB24: 360.0000 mg | ORAL_TABLET | Freq: Every day | ORAL | 0 refills | Status: DC

## 2018-03-18 NOTE — Telephone Encounter (Signed)
Pt called request Rx refill on : (tried to explain that her Cardiologist originally prescribed Rx & they would have to auth the refill on :   diltiazem (MATZIM LA) 360 MG 24 hr tablet [[id number]]   Order Details  Dose: 360 mg Route: Oral Frequency: Daily  Note to Pharmacy:  Cannot authorize #[id number]. Patient has not seen MD since 2016. Will need appt with Dr. Debara Pickett or defer to PCP. Thanks      Indications of Use: angina  Dispense Quantity: 30 tablet Refills: 0 Fills remaining: --        Sig: Take 1 tablet (360 mg total) by mouth daily. <PLEASE MAKE APPOINTMENT FOR REFILLS>          ----Per patient is out & doesn't have a Appt w/them until 12/19--advised Valetta Fuller would not probably refill but would forward her request & they would call her if any questions/ concerns.  --glh

## 2018-03-18 NOTE — Telephone Encounter (Signed)
rx sent-15 day supply as patient has not been seen since 2016,  appt is scheduled for 12/19 with Rosaria Ferries PA. Advised must keep appt for further refills.

## 2018-03-18 NOTE — Telephone Encounter (Signed)
New message   Pt states she has 4 tablets left. She would like to know if this gets called into the pharmacy.  *STAT* If patient is at the pharmacy, call can be transferred to refill team.   1. Which medications need to be refilled? (please list name of each medication and dose if known) matzim  2. Which pharmacy/location (including street and city if local pharmacy) is medication to be sent to? [facility] [address]  3. Do they need a 30 day or 90 day supply? 30 day

## 2018-03-18 NOTE — Telephone Encounter (Signed)
LVM informing pt that Dr. Debara Pickett refilled this medication today and she will need to request refills for this medication from his office.  Charyl Bigger, CMA

## 2018-03-21 ENCOUNTER — Other Ambulatory Visit: Payer: Self-pay | Admitting: Family Medicine

## 2018-03-26 ENCOUNTER — Ambulatory Visit: Payer: Medicare Other | Admitting: Physician Assistant

## 2018-03-26 ENCOUNTER — Encounter: Payer: Self-pay | Admitting: Physician Assistant

## 2018-03-26 VITALS — BP 126/86 | HR 86 | Ht 64.0 in | Wt 222.0 lb

## 2018-03-26 DIAGNOSIS — R0600 Dyspnea, unspecified: Secondary | ICD-10-CM

## 2018-03-26 DIAGNOSIS — R079 Chest pain, unspecified: Secondary | ICD-10-CM | POA: Diagnosis not present

## 2018-03-26 DIAGNOSIS — I10 Essential (primary) hypertension: Secondary | ICD-10-CM | POA: Diagnosis not present

## 2018-03-26 DIAGNOSIS — R002 Palpitations: Secondary | ICD-10-CM

## 2018-03-26 DIAGNOSIS — R0609 Other forms of dyspnea: Secondary | ICD-10-CM

## 2018-03-26 NOTE — Progress Notes (Signed)
Cardiology Office Note   Date:  03/26/2018   ID:  Kayla [name], DOB [birth year], MRN [id number]  PCP:  [name], NP Cardiologist:  Buford Dresser, MD, new, will see in follow-up Rosaria Ferries, PA-C   Chief Complaint  Patient presents with  . Follow-up    BP medication concerns. Patient says that when she lay down at night some times in feels like her whole body is vibrating.  . Fatigue  . Chest Pain    Feels like heart burn whenever she eats. Feels like she is so full that she is going to vomit.     History of Present Illness: Kayla [name] is a 72 y.o. female with a history of mild CAD at cath 2009, HTN, HLD, IBS, hypothyroid, GERD, anemia, OA, fibromyalgia  12/11 phone notes regarding patient wanting refills on Cardizem, but has not been seen since 2016, appointment needed  Kayla [name] presents for cardiology follow up.   She took her BP and noted SBP 170s. She went to the fire station and confirmed that her BP was very high. This was in the setting of emotional stress from the death of a friend.   However, she is very concerned about her BP.   She is very concerned because her father dropped dead of a heart attack. Other family members have had CAD. She is very concerned about that.  All 4 children have moved in with her, including a son who is disabled. They all live in a 2000 sq foot house. Other stressors include the death of a close friend and 2 dogs.  She has been having chest pain after she eats. She will be very full after eating, then gets chest discomfort. She takes Rolaids and the pain goes away.  She gets episodes of feeling that she is vibrating and her heart is pounding, going fast. It feels regular. Does not get SOB, no N&V or diaphoresis. A feeling comes up from her abdomen that she is fading away. She tries to avert the symptoms by distracting herself, that will work at times.  She mentions the term generalized anxiety disorder.  She does the [employer] test every year. However, was out of town and missed it this year. She feels if she had gotten the test, she would not be at our office today.  She has dyspnea on exertion, is not sure if that is because she has coronary artery disease, because she has gained weight, or because she is out of shape.  She denies orthopnea or PND.  She does not get lower extremity edema, except for little bit during the day.  She request to be assigned another cardiologist besides Dr. Debara Pickett.  As it has been 3 years since her last office visit, this is not a problem.  Her cardiologist will be Dr. Harrell Gave.   Past Medical History:  Diagnosis Date  . Anemia   . Asthma   . B12 deficiency   . Chronic LBP   . Diastolic dysfunction   . Fibromyalgia   . GERD (gastroesophageal reflux disease)   . Hyperlipemia   . Hypertension   . Hypothyroid   . IBS (irritable bowel syndrome)   . S/P cardiac cath 11/30/2007   normal coronaries - Dr. Burt Knack (Dr. Debara Pickett reviewed films on 01/10/2016)    Past Surgical History:  Procedure Laterality Date  . CESAREAN SECTION    . CHOLECYSTECTOMY    . [place]  Current Outpatient Medications  Medication Sig Dispense Refill  . acetaminophen-codeine (TYLENOL #3) 300-30 MG tablet Take 1-2 tablets by mouth every 6 (six) hours as needed for moderate pain. 15 tablet 0  . chlorhexidine gluconate, MEDLINE KIT, (PERIDEX) 0.12 % solution Use as directed 15 mLs in the mouth or throat 2 (two) times daily. 120 mL 0  . [employer] VITAMIN B12 1000 MCG tablet TAKE 1 TABLET (1,000 MCG TOTAL) BY MOUTH DAILY. 90 tablet 1  . diltiazem (MATZIM LA) 360 MG 24 hr tablet Take 1 tablet (360 mg total) by mouth daily. Must keep upcoming appt for further refills 15 tablet 0  . ketoconazole (NIZORAL) 2 % shampoo Apply 1 application topically 2 (two) times a week. 120 mL 0  . levothyroxine (SYNTHROID, LEVOTHROID) 200 MCG tablet Take 1 tablet (200 mcg total) by mouth daily before breakfast. 90 tablet  0  . levothyroxine (SYNTHROID, LEVOTHROID) 25 MCG tablet 1/2 tablet daily to be taken with 200 mcg tablet on tuesdays and thursdays only 30 tablet 0  . omega-3 acid ethyl esters (LOVAZA) 1 g capsule TAKE 2 CAPSULES (2 G TOTAL) BY MOUTH 2 (TWO) TIMES DAILY. 180 capsule 1  . Potassium 75 MG TABS Take 1 tablet daily by mouth.     . Vitamin D, Ergocalciferol, (DRISDOL) 50000 units CAPS capsule TAKE ONE CAPSULE BY MOUTH ONE TIME PER WEEK 12 capsule 10   No current facility-administered medications for this visit.     Allergies:   Ace inhibitors and Lisinopril    Social History:  The patient  reports that she has never smoked. She has never used smokeless tobacco. She reports that she does not drink alcohol or use drugs.   Family History:  The patient's family history includes Alcohol abuse in her maternal aunt and maternal uncle; Cancer in her maternal aunt, maternal uncle, mother, paternal uncle, and paternal uncle; Congestive Heart Failure in her maternal grandfather; Heart attack in her paternal grandfather; Heart attack (age of onset: 33) in her father; Leukemia in an other family member; Lung cancer in an other family member; Stroke in her mother.  She indicated that the status of her mother is unknown. She indicated that the status of her father is unknown. She indicated that the status of her maternal grandfather is unknown. She indicated that the status of her paternal grandfather is unknown. She indicated that the status of her maternal aunt is unknown. She indicated that the status of her maternal uncle is unknown. She indicated that the status of her neg hx is unknown.    ROS:  Please see the history of present illness. All other systems are reviewed and negative.    PHYSICAL EXAM: VS:  BP 126/86 (BP Location: Left Arm, Patient Position: Sitting, Cuff Size: Large)   Pulse 86   Ht 5' 4" (1.626 m)   Wt 222 lb (100.7 kg)   BMI 38.11 kg/m  , BMI Body mass index is 38.11 kg/m. GEN:  Well nourished, well developed, female in no acute distress HEENT: normal for age  Neck: no JVD, no carotid bruit, no masses Cardiac: RRR; soft murmur, no rubs, or gallops Respiratory:  clear to auscultation bilaterally, normal work of breathing GI: soft, nontender, nondistended, + BS MS: no deformity or atrophy; no edema; distal pulses are 2+ in all 4 extremities  Skin: warm and dry, no rash Neuro:  Strength and sensation are intact Psych: euthymic mood, full affect   EKG:  EKG is not ordered today.  ECHO: 07/14/2014 - Left ventricle: The cavity size was normal. There was mild concentric hypertrophy. Systolic function was vigorous. The estimated ejection fraction was in the range of 65% to 70%. Wall motion was normal; there were no regional wall motion abnormalities. Doppler parameters are consistent with abnormal left ventricular relaxation (grade 1 diastolic dysfunction). There was no evidence of elevated ventricular filling pressure by Doppler parameters. - Aortic valve: Mildly thickened, mildly calcified leaflets. There was no regurgitation. - Ascending aorta: The ascending aorta was normal in size. - Mitral valve: Structurally normal valve. There was trivial regurgitation. - Left atrium: The atrium was normal in size. - Right ventricle: The cavity size was normal. Wall thickness was normal. Systolic pressure couldn&'t be assessed as there was no tricuspid regurgitation. - Atrial septum: No defect or patent foramen ovale was identified. - Tricuspid valve: There was no significant regurgitation. - Pulmonic valve: There was no regurgitation. - Inferior vena cava: The vessel was normal in size. The respirophasic diameter changes were in the normal range (= 50%), consistent with normal central venous pressure.  MYOVIEW: 07/21/2014 Impression Exercise Capacity:  [place] with no exercise. BP Response:  Normal blood pressure response. Clinical  Symptoms:  Mild shortness of breath ECG Impression:  No significant ST segment change suggestive of ischemia. Comparison with Prior Nuclear Study: No images to compare  Overall Impression:  Normal stress nuclear study.  LV Wall Motion:  NL LV Function, EF 72%; NL Wall Motion   Recent Labs: 12/02/2017: TSH 0.175 03/02/2018: ALT 12; BUN 12; Creatinine, Ser 0.82; Hemoglobin 14.5; Platelets 349; Potassium 3.1; Sodium 141  CBC    Component Value Date/Time   WBC 12.5 (H) 03/02/2018 1328   RBC 4.61 03/02/2018 1328   HGB 14.5 03/02/2018 1328   HGB 14.6 04/25/2017 0912   HCT 45.5 03/02/2018 1328   HCT 43.6 04/25/2017 0912   PLT 349 03/02/2018 1328   PLT 374 04/25/2017 0912   MCV 98.7 03/02/2018 1328   MCV 94 04/25/2017 0912   MCH 31.5 03/02/2018 1328   MCHC 31.9 03/02/2018 1328   RDW 12.6 03/02/2018 1328   RDW 13.5 04/25/2017 0912   LYMPHSABS 3.4 (H) 04/25/2017 0912   MONOABS 712 01/01/2016 1012   EOSABS 0.2 04/25/2017 0912   BASOSABS 0.0 04/25/2017 0912   CMP Latest Ref Rng & Units 03/02/2018 10/29/2017 04/25/2017  Glucose 70 - 99 mg/dL 135(H) 91 105(H)  BUN 8 - 23 mg/dL _0 Creatinine 0.44 - 1.00 mg/dL 0.82 0.98 0.97  Sodium 135 - 145 mmol/L 141 143 147(H)  Potassium 3.5 - 5.1 mmol/L 3.1(L) 4.4 4.3  Chloride 98 - 111 mmol/L 110 103 108(H)  CO2 22 - 32 mmol/L 21(L) 22 20  Calcium 8.9 - 10.3 mg/dL 9.2 9.4 10.2  Total Protein 6.5 - 8.1 g/dL 7.4 7.2 6.9  Total Bilirubin 0.3 - 1.2 mg/dL 0.6 0.3 0.2  Alkaline Phos 38 - 126 U/L 83 138(H) 116  AST 15 - 41 U/L _1 ALT 0 - 44 U/L _2 Lipid Panel Lab Results  Component Value Date   CHOL 217 (H) 04/25/2017   HDL 47 04/25/2017   LDLCALC 137 (H) 04/25/2017   LDLDIRECT 169.8 04/15/2007   TRIG 166 (H) 04/25/2017   CHOLHDL 4.6 (H) 04/25/2017      Wt Readings from Last 3 Encounters:  03/26/18 222 lb (100.7 kg)  12/02/17 222 lb 6.4 oz (100.9 kg)  10/29/17 219 lb 9.6 oz (  99.6 kg)     Other studies  Reviewed: Additional studies/ records that were reviewed today include: Office notes, hospital records and testing.  ASSESSMENT AND PLAN:  1.  Chest pain: Her symptoms occur after eating and are relieved by Tums.  No further work-up for this is indicated at this time.  2.  Hypertension: Her blood pressure is much improved on the diltiazem, continue this  3.  Palpitations: Get a ZIO monitor, follow-up on the results  4.  Dyspnea on exertion: Because of her musculoskeletal issues, she may well have significant deconditioning.  Check an echocardiogram.  Previously, she has had a normal EF with grade 1 diastolic dysfunction and no significant valvular abnormalities.  5.  Hypokalemia: On her labs in November, her potassium was 3.1.  She states that she had these episodes of palpitations at one point when her potassium was very low.  She states she was not told to take extra potassium when her potassium was low in November. -Plan-recheck a BMET today   Current medicines are reviewed at length with the patient today.  The patient has concerns regarding medicines.  The following changes have been made: Continue Cardizem  Labs/ tests ordered today include:   Orders Placed This Encounter  Procedures  . Basic metabolic panel  . LONG TERM MONITOR (3-14 DAYS)  . ECHOCARDIOGRAM COMPLETE     Disposition:   FU with Buford Dresser, MD  Signed, Rosaria Ferries, PA-C  03/26/2018 5:10 PM    [facility] Phone: [phone]; Fax: [phone]

## 2018-03-26 NOTE — Patient Instructions (Signed)
Medication Instructions:  None  If you need a refill on your cardiac medications before your next appointment, please call your pharmacy.   Lab work: Your physician recommends that you return for lab work today: BMET  If you have labs (blood work) drawn today and your tests are completely normal, you will receive your results only by: Marland Kitchen MyChart Message (if you have MyChart) OR . A paper copy in the mail If you have any lab test that is abnormal or we need to change your treatment, we will call you to review the results.  Testing/Procedures: Your physician has requested that you have an echocardiogram. Echocardiography is a painless test that uses sound waves to create images of your heart. It provides your doctor with information about the size and shape of your heart and how well your heart's chambers and valves are working. This procedure takes approximately one hour. There are no restrictions for this procedure.  Your physician has recommended that you wear a 30 day ZIO event monitor. Event monitors are medical devices that record the heart's electrical activity. Doctors most often [place] these monitors to diagnose arrhythmias. Arrhythmias are problems with the speed or rhythm of the heartbeat. The monitor is a small, portable device. You can wear one while you do your normal daily activities. This is usually used to diagnose what is causing palpitations/syncope (passing out).   Follow-Up: At [facility], you and your health needs are our priority.  As part of our continuing mission to provide you with exceptional heart care, we have created designated Provider Care Teams.  These Care Teams include your primary Cardiologist (physician) and Advanced Practice Providers (APPs -  Physician Assistants and Nurse Practitioners) who all work together to provide you with the care you need, when you need it. You will need a follow up appointment in 8 weeks with Kayla [name].   Advanced  Practice Providers on your designated Care Team:   Kayla Ferries, PA-C . Kayla Sims, DNP, ANP  Any Other Special Instructions Will Be Listed Below (If Applicable). None

## 2018-03-27 ENCOUNTER — Other Ambulatory Visit: Payer: Self-pay | Admitting: Adult Health

## 2018-03-27 LAB — BASIC METABOLIC PANEL
BUN/Creatinine Ratio: 9 — ABNORMAL LOW (ref 12–28)
BUN: 8 mg/dL (ref 8–27)
CO2: 22 mmol/L (ref 20–29)
Calcium: 9.8 mg/dL (ref 8.7–10.3)
Chloride: 107 mmol/L — ABNORMAL HIGH (ref 96–106)
Creatinine, Ser: 0.9 mg/dL (ref 0.57–1.00)
GFR calc Af Amer: 74 mL/min/{1.73_m2} (ref >59)
GFR calc non Af Amer: 64 mL/min/{1.73_m2} (ref >59)
Glucose: 102 mg/dL — ABNORMAL HIGH (ref 65–99)
Potassium: 4.1 mmol/L (ref 3.5–5.2)
Sodium: 145 mmol/L — ABNORMAL HIGH (ref 134–144)

## 2018-04-02 ENCOUNTER — Other Ambulatory Visit: Payer: Self-pay | Admitting: Adult Health

## 2018-04-05 ENCOUNTER — Other Ambulatory Visit: Payer: Self-pay | Admitting: Internal Medicine

## 2018-04-06 ENCOUNTER — Institutional Professional Consult (permissible substitution): Payer: Medicare Other | Admitting: Emergency Medicine

## 2018-04-14 ENCOUNTER — Other Ambulatory Visit (HOSPITAL_COMMUNITY): Payer: Medicare Other

## 2018-04-15 ENCOUNTER — Encounter: Payer: Self-pay | Admitting: Physician Assistant

## 2018-04-29 ENCOUNTER — Other Ambulatory Visit: Payer: Self-pay

## 2018-04-29 ENCOUNTER — Ambulatory Visit (INDEPENDENT_AMBULATORY_CARE_PROVIDER_SITE_OTHER): Payer: Medicare Other

## 2018-04-29 ENCOUNTER — Ambulatory Visit (HOSPITAL_COMMUNITY): Payer: Medicare Other | Attending: Cardiology

## 2018-04-29 DIAGNOSIS — R002 Palpitations: Secondary | ICD-10-CM | POA: Insufficient documentation

## 2018-04-29 DIAGNOSIS — R0609 Other forms of dyspnea: Secondary | ICD-10-CM

## 2018-04-29 DIAGNOSIS — R0600 Dyspnea, unspecified: Secondary | ICD-10-CM

## 2018-05-12 ENCOUNTER — Other Ambulatory Visit: Payer: Self-pay

## 2018-05-12 ENCOUNTER — Telehealth: Payer: Self-pay

## 2018-05-12 DIAGNOSIS — I7121 Aneurysm of the ascending aorta, without rupture: Secondary | ICD-10-CM

## 2018-05-12 DIAGNOSIS — I712 Thoracic aortic aneurysm, without rupture: Secondary | ICD-10-CM

## 2018-05-12 NOTE — Telephone Encounter (Signed)
Per Rosaria Ferries, PA-C: See message below. Needs a CT for aneurysm. Can you help make that happen?  Thanks

## 2018-05-12 NOTE — Telephone Encounter (Signed)
Left message for patient to contact office.

## 2018-05-13 NOTE — Telephone Encounter (Signed)
Per Rosaria Ferries, PA-C: let patient know that her Aorta was a little dilated and we do not need to do anything but measure it and track it. And we will watch it to see if it enlarges.  Patient notified of Rhonda's recommendations. Patient states she will contact the office tomorrow with her schedule to see when she can have the CT done.

## 2018-05-13 NOTE — Telephone Encounter (Signed)
-----   Message from Lonn Georgia, PA-C sent at 05/08/2018  4:46 PM EST ----- Regarding: RE: Echo See message below. Needs a CT for aneurysm. Can you help make that happen? Thanks ----- Message ----- From: Buford Dresser, MD Sent: 05/06/2018   9:05 AM EST To: Lonn Georgia, PA-C Subject: RE: Echo                                       Hi Rhonda--I think a CT might be best. Her kidney function looks normal (and they may want a recheck anyway). There is a protocol for CT aneurysm. Thanks! Bridgette ----- Message ----- From: Reola Mosher Sent: 05/05/2018   3:30 PM EST To: Buford Dresser, MD Subject: Echo                                           Please see echo results. They recommend CT or MRA for aneurysm. Do you want me to order? Thanks  ----- Message ----- From: Interface, [employer] Sent: 04/29/2018   4:32 PM EST To: Lonn Georgia, PA-C

## 2018-05-14 ENCOUNTER — Other Ambulatory Visit: Payer: Self-pay | Admitting: Adult Health

## 2018-05-15 NOTE — Telephone Encounter (Signed)
CT Chest Aorta scheduled 05/19/18.

## 2018-05-18 ENCOUNTER — Other Ambulatory Visit: Payer: Self-pay | Admitting: *Deleted

## 2018-05-18 DIAGNOSIS — Z01812 Encounter for preprocedural laboratory examination: Secondary | ICD-10-CM

## 2018-05-19 ENCOUNTER — Other Ambulatory Visit: Payer: Medicare Other

## 2018-05-19 ENCOUNTER — Other Ambulatory Visit: Payer: Self-pay

## 2018-05-19 DIAGNOSIS — Z01812 Encounter for preprocedural laboratory examination: Secondary | ICD-10-CM

## 2018-05-20 LAB — BASIC METABOLIC PANEL
BUN/Creatinine Ratio: 13 (ref 12–28)
BUN: 11 mg/dL (ref 8–27)
CO2: 21 mmol/L (ref 20–29)
Calcium: 9.5 mg/dL (ref 8.7–10.3)
Chloride: 107 mmol/L — ABNORMAL HIGH (ref 96–106)
Creatinine, Ser: 0.85 mg/dL (ref 0.57–1.00)
GFR calc Af Amer: 79 mL/min/{1.73_m2} (ref >59)
GFR calc non Af Amer: 69 mL/min/{1.73_m2} (ref >59)
Glucose: 99 mg/dL (ref 65–99)
Potassium: 3.9 mmol/L (ref 3.5–5.2)
Sodium: 143 mmol/L (ref 134–144)

## 2018-05-21 ENCOUNTER — Telehealth: Payer: Self-pay | Admitting: Cardiology

## 2018-05-21 NOTE — Telephone Encounter (Signed)
Returned call to patient advised lab results have not been reviewed by Dr.at present.Advised bmet done on 05/19/18 normal.Patient stated she has appointment with Dr.Christopher 05/28/18 at 3:40 pm.Stated she has been having frequent palpitations and is very anxious. She is under a lot of stress. She also had a echo and wore a monitor.She will be discussing results at her visit.Stated her palpitations have become worse within the past 6 months.Advised I will send message to Dr.Christopher's RN to make her aware.

## 2018-05-21 NOTE — Telephone Encounter (Signed)
Patient returned call for results 

## 2018-05-22 ENCOUNTER — Ambulatory Visit (INDEPENDENT_AMBULATORY_CARE_PROVIDER_SITE_OTHER)
Admission: RE | Admit: 2018-05-22 | Discharge: 2018-05-22 | Disposition: A | Discharge status: Home / Self Care | Payer: Medicare Other | Source: Ambulatory Visit | Attending: Physician Assistant | Admitting: Physician Assistant

## 2018-05-22 DIAGNOSIS — I712 Thoracic aortic aneurysm, without rupture: Secondary | ICD-10-CM

## 2018-05-22 DIAGNOSIS — I7121 Aneurysm of the ascending aorta, without rupture: Secondary | ICD-10-CM

## 2018-05-22 MED ORDER — IOPAMIDOL (ISOVUE-370) INJECTION 76%
100.0000 mL | Freq: Once | INTRAVENOUS | Status: AC | PRN
  Administered 2018-05-22: 100 mL via INTRAVENOUS

## 2018-05-22 NOTE — Telephone Encounter (Signed)
Pt returned the Nurse's call. She said she is hard of hearing, and could not understand what the message said. Pt has a CT Scan at 12:30 today.please call back after test

## 2018-05-22 NOTE — Telephone Encounter (Signed)
Spoke with pt. Pt made aware of lab results.  Pt is scheduled for her CT today.  Notes recorded by Lonn Georgia, PA-C on 05/21/2018 at 6:09 PM EST Please let her know that her labs are fine. She needs to eat some potassium rich foods daily, but do not go overboard. Thanks

## 2018-05-28 ENCOUNTER — Ambulatory Visit: Payer: Medicare Other | Admitting: Cardiology

## 2018-06-01 ENCOUNTER — Encounter: Payer: Self-pay | Admitting: Cardiology

## 2018-06-01 ENCOUNTER — Ambulatory Visit: Payer: Medicare Other | Admitting: Cardiology

## 2018-06-01 VITALS — BP 148/90 | HR 81 | Ht 64.0 in | Wt 224.0 lb

## 2018-06-01 DIAGNOSIS — I2510 Atherosclerotic heart disease of native coronary artery without angina pectoris: Secondary | ICD-10-CM | POA: Diagnosis not present

## 2018-06-01 DIAGNOSIS — Z6838 Body mass index (BMI) 38.0-38.9, adult: Secondary | ICD-10-CM

## 2018-06-01 DIAGNOSIS — I10 Essential (primary) hypertension: Secondary | ICD-10-CM | POA: Diagnosis not present

## 2018-06-01 DIAGNOSIS — Z712 Person consulting for explanation of examination or test findings: Secondary | ICD-10-CM

## 2018-06-01 DIAGNOSIS — Z8249 Family history of ischemic heart disease and other diseases of the circulatory system: Secondary | ICD-10-CM

## 2018-06-01 DIAGNOSIS — M940 Chondrocostal junction syndrome [Tietze]: Secondary | ICD-10-CM | POA: Insufficient documentation

## 2018-06-01 DIAGNOSIS — R0683 Snoring: Secondary | ICD-10-CM | POA: Diagnosis not present

## 2018-06-01 DIAGNOSIS — E782 Mixed hyperlipidemia: Secondary | ICD-10-CM

## 2018-06-01 NOTE — Progress Notes (Signed)
Cardiology Office Note:    Date:  06/01/2018   ID:  Kayla [name], DOB October 27, 1945, MRN [id number]  PCP:  [name], NP  Cardiologist:  Buford Dresser, MD PhD  Referring MD: [name], NP   Chief Complaint  Patient presents with  . Follow-up    Post echo and monitor. Patient says at times she feels like she is dying.  . Shortness of Breath    Very.  . Fatigue    History of Present Illness:    Kayla [name] is a 73 y.o. female with a hx of mild CAD, HTN, HLD, IBS, hypothyroid, GERD, fibromyalgia, OA who is seen as a new patient to me. Seen by Rosaria Ferries 03/26/18 for hypertension and chest discomfort.  Today: brings an entire page of lists/questions with her today. She is tearful today, very anxious. Notes that she is 83 and her father died suddenly at age 50, with no symptoms or warning. Her paternal grandfather died suddenly at age 73. All of her mother's siblings except for 1 had heart disease. Mother was healthy except for alzheimers and died at age 64.  -Feels like she is completely tight throughout her chest. Notes that she has had costochondritis in her chest before, feels like her "bra is too tight." Currently pain across low chest, all the way across, including wrapping around to her back.  -concerned about chronic ankle edema and skin changes -feels like food gets stuck in her throat, had that in the past with GERD, but worried it is the aortic dilation causing this -extreme fatigue, not sleeping well, exhausted when she wakes up. Feels fatigued, feels like she might fall right asleep if she is sitting still. Snores very loudly, her husband used to not even sleep in the same room as her before he died. Been told that she gasps for air when she sleeps. -has extreme stress, son and his 4 children now living with her. -feels like her whole body is vibrating. Part of why she wanted to wear the monitor, related to symptoms of her heart jumping out of her body -has  felt like at least once/week she needs to go to the ER for pain in her arms, shortness of breath. Gives herself a few minutes, happens in the middle of the night, and it gets better. She thinks about when she had anaphylaxis in the past and worries if she might be dying.   Most of today's visit was reviewing her concerns and reassurance. Discussed all of her concerns in depth. Reviewed the results of her recent testing, including echo, monitor, and CTA. All questions answered.  Cardiac ROS diffusely positive, including entire chest/abdominal/back pain, shortness of breath at rest or with normal exertion. Cannot sleep on her back due to back pain, but denies PND, orthopnea. Has chronic LE edema. Has frequent palpitations, hence the monitor.  Past Medical History:  Diagnosis Date  . Anemia   . Asthma   . B12 deficiency   . Chronic LBP   . Diastolic dysfunction   . Fibromyalgia   . GERD (gastroesophageal reflux disease)   . Hyperlipemia   . Hypertension   . Hypothyroid   . IBS (irritable bowel syndrome)   . S/P cardiac cath 11/30/2007   normal coronaries - Dr. Burt Knack (Dr. Debara Pickett reviewed films on 01/10/2016)    Past Surgical History:  Procedure Laterality Date  . CESAREAN SECTION    . CHOLECYSTECTOMY    . [place]  Current Medications: Current Outpatient Medications on File Prior to Visit  Medication Sig  . acetaminophen-codeine (TYLENOL #3) 300-30 MG tablet Take 1-2 tablets by mouth every 6 (six) hours as needed for moderate pain.  . chlorhexidine gluconate, MEDLINE KIT, (PERIDEX) 0.12 % solution Use as directed 15 mLs in the mouth or throat 2 (two) times daily.  . [employer] VITAMIN B12 1000 MCG tablet TAKE 1 TABLET (1,000 MCG TOTAL) BY MOUTH DAILY.  Marland Kitchen ketoconazole (NIZORAL) 2 % shampoo Apply 1 application topically 2 (two) times a week.  . levothyroxine (SYNTHROID, LEVOTHROID) 200 MCG tablet TAKE 1 TABLET (200 MCG TOTAL) BY MOUTH DAILY BEFORE BREAKFAST.  Marland Kitchen levothyroxine  (SYNTHROID, LEVOTHROID) 25 MCG tablet TAKE 1/2 TABLET BY MOUTH DAILY ON TUESDAYS AND THURSDAYS (ALONG WITH LEVOTHYROXINE 200 MCG TAB)  . MATZIM LA 360 MG 24 hr tablet TAKE 1 TABLET (360 MG TOTAL) BY MOUTH DAILY. MUST KEEP UPCOMING APPT FOR FURTHER REFILLS  . omega-3 acid ethyl esters (LOVAZA) 1 g capsule TAKE 2 CAPSULES (2 G TOTAL) BY MOUTH 2 (TWO) TIMES DAILY.  Marland Kitchen Potassium 75 MG TABS Take 1 tablet daily by mouth.   . Vitamin D, Ergocalciferol, (DRISDOL) 50000 units CAPS capsule TAKE ONE CAPSULE BY MOUTH ONE TIME PER WEEK   No current facility-administered medications on file prior to visit.      Allergies:   Ace inhibitors and Lisinopril   Social History   Socioeconomic History  . Marital status: Widowed    Spouse name: Not on file  . Number of children: 2  . Years of education: Not on file  . Highest education level: Not on file  Occupational History  . Not on file  Social Needs  . Financial resource strain: Not on file  . Food insecurity:    Worry: Not on file    Inability: Not on file  . Transportation needs:    Medical: Not on file    Non-medical: Not on file  Tobacco Use  . Smoking status: Never Smoker  . Smokeless tobacco: Never Used  Substance and Sexual Activity  . Alcohol use: No  . Drug use: No  . Sexual activity: Not on file  Lifestyle  . Physical activity:    Days per week: Not on file    Minutes per session: Not on file  . Stress: Not on file  Relationships  . Social connections:    Talks on phone: Not on file    Gets together: Not on file    Attends religious service: Not on file    Active member of club or organization: Not on file    Attends meetings of clubs or organizations: Not on file    Relationship status: Not on file  Other Topics Concern  . Not on file  Social History Narrative   Lost husband 2009, lost mother 2016     Family History: The patient's family history includes Alcohol abuse in her maternal aunt and maternal uncle; Cancer in  her maternal aunt, maternal uncle, mother, paternal uncle, and paternal uncle; Congestive Heart Failure in her maternal grandfather; Heart attack in her paternal grandfather; Heart attack (age of onset: 53) in her father; Leukemia in an other family member; Lung cancer in an other family member; Stroke in her mother. There is no history of Colon cancer, Breast cancer, or Diabetes.  ROS:   Please see the history of present illness.  Additional pertinent ROS:  Constitutional: Negative for chills, fever, night sweats, unintentional weight loss  HENT:  Negative for ear pain and hearing loss.   Eyes: Negative for loss of vision and eye pain.  Respiratory: Negative for cough, sputum. Positive for frequent shortness of breath, nonexertional.   Cardiovascular: See HPI. Gastrointestinal: Negative for abdominal pain, melena, and hematochezia.  Genitourinary: Negative for dysuria and hematuria.  Musculoskeletal: Negative for falls, positive for diffuse myalgias.  Skin: Negative for itching and rash. Positive for moles that she is concerned about Neurological: Negative for focal weakness, focal sensory changes and loss of consciousness.  Endo/Heme/Allergies: Does bruise/bleed easily.    EKGs/Labs/Other Studies Reviewed:    The following studies were reviewed today: CTA 05/31/18 1. 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 Technically, current recommendations call for annual follow-up imaging for thoracic aneurysms of 4 cm or larger. However, 10 year stability of the current aneurysm is certainly reassuring. Aortic aneurysm NOS (ICD10-I71.9), Aortic Atherosclerosis (ICD10-170.0) 2. Small hiatal hernia.   Echo 04/29/18 Left ventricle: The cavity size was normal. There was mild   concentric hypertrophy. Systolic function was vigorous. The    estimated ejection fraction was in the range of 65% to 70%. Wall   motion was normal; there were no regional wall motion   abnormalities. Doppler parameters are consistent with abnormal   left ventricular relaxation (grade 1 diastolic dysfunction). - Aortic valve: Valve area (VTI): 4.83 cm^2. Valve area (Vmax):   4.21 cm^2. Valve area (Vmean): 4.76 cm^2. - Left atrium: The atrium was mildly dilated. - Right ventricle: The cavity size was normal. Wall thickness was   normal. Systolic function was normal. - Right atrium: The atrium was normal in size. - Pulmonary arteries: Systolic pressure was within the normal   range. - Inferior vena cava: The vessel was normal in size. - Pericardium, extracardiac: There was no pericardial effusion.  Impressions: - Since the last study on 07/14/2014 there is new ascending aortic   aneurysm measuring 43 mm. A dedicated chest CTA or MRA is   recommended.  Monitor read 06/01/18 13 days of data recorded on Zio monitor. Patient had a min HR of 44 bpm, max HR of 162 bpm, and avg HR of 70 bpm. Predominant underlying rhythm was Sinus Rhythm. 6 episodes of SVT noted, longest lasted 18.4 seconds at 110 bpm. Fastest lasted only 6 beats at a max rate of 162 bpm. No VT, atrial fibrillation, high degree block, or pauses noted. Isolated atrial and ventricular ectopy was rare (<1%). There were 19 patient triggered events. These were all sinus rhythm, rarely with PAC or PVC.   ECHO: 07/14/2014 - Left ventricle: The cavity size was normal. There was mild concentric hypertrophy. Systolic function was vigorous. The estimated ejection fraction was in the range of 65% to 70%. Wall motion was normal; there were no regional wall motion abnormalities. Doppler parameters are consistent with abnormal left ventricular relaxation (grade 1 diastolic dysfunction). There was no evidence of elevated ventricular filling pressure by Doppler parameters. - Aortic valve:  Mildly thickened, mildly calcified leaflets. There was no regurgitation. - Ascending aorta: The ascending aorta was normal in size. - Mitral valve: Structurally normal valve. There was trivial regurgitation. - Left atrium: The atrium was normal in size. - Right ventricle: The cavity size was normal. Wall thickness was normal. Systolic pressure couldn&'t be assessed as there was no tricuspid regurgitation. - Atrial septum: No defect or patent foramen ovale was identified. -  Tricuspid valve: There was no significant regurgitation. - Pulmonic valve: There was no regurgitation. - Inferior vena cava: The vessel was normal in size. The respirophasic diameter changes were in the normal range (= 50%), consistent with normal central venous pressure.  MYOVIEW: 07/21/2014 Impression Exercise Capacity: [place] with no exercise. BP Response: Normal blood pressure response. Clinical Symptoms: Mild shortness of breath ECG Impression: No significant ST segment change suggestive of ischemia. Comparison with Prior Nuclear Study: No images to compare  Overall Impression: Normal stress nuclear study.  LV Wall Motion: NL LV Function, EF 72%; NL Wall Motion  EKG:  EKG is personally reviewed.  The ekg ordered today demonstrates normal sinus rhythm  Recent Labs: 12/02/2017: TSH 0.175 03/02/2018: ALT 12; Hemoglobin 14.5; Platelets 349 05/19/2018: BUN 11; Creatinine, Ser 0.85; Potassium 3.9; Sodium 143  Recent Lipid Panel    Component Value Date/Time   CHOL 217 (H) 04/25/2017 0912   TRIG 166 (H) 04/25/2017 0912   HDL 47 04/25/2017 0912   CHOLHDL 4.6 (H) 04/25/2017 0912   CHOLHDL 6.4 (H) 01/01/2016 1012   VLDL 58 (H) 01/01/2016 1012   LDLCALC 137 (H) 04/25/2017 0912   LDLDIRECT 169.8 04/15/2007 1017    Physical Exam:    VS:  BP (!) 148/90 (BP Location: Left Arm, Patient Position: Sitting, Cuff Size: Large)   Pulse 81   Ht 5' 4"  (1.626 m)   Wt 224 lb (101.6 kg)   BMI 38.45  kg/m     Wt Readings from Last 3 Encounters:  06/01/18 224 lb (101.6 kg)  03/26/18 222 lb (100.7 kg)  12/02/17 222 lb 6.4 oz (100.9 kg)     GEN: Well nourished, well developed in no acute distress HEENT: Normal NECK: No JVD; No carotid bruits LYMPHATICS: No lymphadenopathy CARDIAC: regular rhythm, normal S1 and S2, no murmurs, rubs, gallops. Radial and DP pulses 2+ bilaterally. Tenderness to palpation over parts of chest. RESPIRATORY:  Clear to auscultation without rales, wheezing or rhonchi  ABDOMEN: Soft, non-tender, non-distended MUSCULOSKELETAL:  Bilateral nonpitting LE edema with chronic venous stasis changes SKIN: Warm and dry NEUROLOGIC:  Alert and oriented x 3 PSYCHIATRIC:  Normal affect   ASSESSMENT:    1. Essential hypertension   2. Snoring   3. Costochondritis   4. Coronary artery disease involving native heart without angina pectoris, unspecified vessel or lesion type   5. Mixed hyperlipidemia   6. Family history of heart disease   7. Class 2 severe obesity due to excess calories with serious comorbidity and body mass index (BMI) of 38.0 to 38.9 in adult ([place])   8. Encounter to discuss test results    PLAN:    Test results: reviewed echo, monitor, and CTA -Asc aortic aneurysm stable at 4.2 cm, similar to 2009 on CTA -rare SVT (brief, not during her symptoms), otherwise NSR on monitor -echo normal other than grade 1 DD and asc aortic aneurysm (as above) -majority of visit spent reviewing these results and her symptoms  Snoring, extreme daytime fatigue, gasping for air when she sleeps: will order sleep study for further evaluation  Costochondritis: similar to her prior episodes, tender to palpation on exam.   Nonobstructive CAD: dicussed prevention only briefly today given other concerns -aspirin: discuss at next visit -statin: has been on in the past, self-discontinued, declined restarting multiple times in the past.   CV risk factors: -hyperlipidemia: LDL  goal <70, last LDL 137. See comments re: statins. On lovaza. TG 166. Consider change to vascepa if covered. -  A1c 5.6, no history of diabetes -has family history of CAD, increases her risk -class 2 obesity, with serious side effect of CAD: would benefit from weight loss. Need to further discuss diet and exercise at next visit.  Hypertension: elevated today, but she is very anxious and tearful. Goal is <130/80. Reassess at next visit when less upset -on no current agents. Would consider amlodipine, chlorthalidone, or lisinopril at next visit if still elevated.  Palpitations: monitor shows normal sinus rhythm with only rare SVT episodes, and these did not occur during her symptomatic periods. All symptomatic events were NSR. -suggests that there may be a disconnect between her nerve sensations and her actual heart rhythm  Plan for follow up: 3 mos to further discuss cardiovascular management  TIME SPENT WITH PATIENT: >60 minutes of direct patient care. More than 50% of that time was spent on coordination of care and counseling regarding test results review, discussion of symptoms, management, symptoms to watch for.  Buford Dresser, MD, PhD D[facility]  [facility]   Medication Adjustments/Labs and Tests Ordered: Current medicines are reviewed at length with the patient today.  Concerns regarding medicines are outlined above.  Orders Placed This Encounter  Procedures  . EKG 12-Lead  . Split night study   No orders of the defined types were placed in this encounter.   Patient Instructions  Medication Instructions:  Your Physician recommend you continue on your current medication as directed.    If you need a refill on your cardiac medications before your next appointment, please call your pharmacy.   Lab work: None  Testing/Procedures: Your physician has recommended that you have a sleep study. This test records several body functions during sleep, including: brain  activity, eye movement, oxygen and carbon dioxide blood levels, heart rate and rhythm, breathing rate and rhythm, the flow of air through your mouth and nose, snoring, body muscle movements, and chest and belly movement. [facility]   Follow-Up: Your physician recommends that you schedule a follow-up appointment in 3 months.          Signed, Buford Dresser, MD PhD 06/01/2018 5:36 PM    [place]

## 2018-06-01 NOTE — Patient Instructions (Addendum)
Medication Instructions:  Your Physician recommend you continue on your current medication as directed.    If you need a refill on your cardiac medications before your next appointment, please call your pharmacy.   Lab work: None  Testing/Procedures: Your physician has recommended that you have a sleep study. This test records several body functions during sleep, including: brain activity, eye movement, oxygen and carbon dioxide blood levels, heart rate and rhythm, breathing rate and rhythm, the flow of air through your mouth and nose, snoring, body muscle movements, and chest and belly movement. [facility]   Follow-Up: Your physician recommends that you schedule a follow-up appointment in 3 months.

## 2018-06-02 ENCOUNTER — Telehealth: Payer: Self-pay | Admitting: Cardiology

## 2018-06-04 ENCOUNTER — Encounter: Payer: Self-pay | Admitting: Adult Health

## 2018-06-04 ENCOUNTER — Ambulatory Visit (INDEPENDENT_AMBULATORY_CARE_PROVIDER_SITE_OTHER): Payer: Medicare Other | Admitting: Adult Health

## 2018-06-04 VITALS — BP 156/88 | HR 85 | Temp 98.2°F | Ht 63.75 in | Wt 225.7 lb

## 2018-06-04 DIAGNOSIS — Z1239 Encounter for other screening for malignant neoplasm of breast: Secondary | ICD-10-CM | POA: Diagnosis not present

## 2018-06-04 DIAGNOSIS — Z780 Asymptomatic menopausal state: Secondary | ICD-10-CM

## 2018-06-04 DIAGNOSIS — E2839 Other primary ovarian failure: Secondary | ICD-10-CM | POA: Diagnosis not present

## 2018-06-04 DIAGNOSIS — Z0000 Encounter for general adult medical examination without abnormal findings: Secondary | ICD-10-CM | POA: Diagnosis not present

## 2018-06-04 DIAGNOSIS — E782 Mixed hyperlipidemia: Secondary | ICD-10-CM

## 2018-06-04 DIAGNOSIS — E079 Disorder of thyroid, unspecified: Secondary | ICD-10-CM

## 2018-06-04 MED ORDER — AMOXICILLIN-POT CLAVULANATE 875-125 MG PO TABS: 1.0000 | ORAL_TABLET | Freq: Two times a day (BID) | ORAL | 0 refills | Status: DC

## 2018-06-04 NOTE — Patient Instructions (Signed)
  Ms. Lineberry , Thank you for taking time to come for your Medicare Wellness Visit. I appreciate your ongoing commitment to your health goals. Please review the following plan we discussed and let me know if I can assist you in the future.   These are the goals we discussed:  TSH drawn today Current Levothyroxine regime- 273mcg daily 12.75mcg in addition to 228mcg on Tuesday and Thursday only Continue all other medications as directed. Please take Augmentin as directed for Right eat infection and sinusitis. Remain well hydrated and follow heart healthy diet. Fibromyalgia and stress/anxiety is likely causing increase in pain, recommend establishing with psychologist.   If you would like a referral, please call the clinic. Walk daily, strive for 15 minutes. Follow-up with various specialists as directed. Please schedule fasting labs in the next 1-2 weeks. Follow-up here in 6 months, sooner if needed.  This is a list of the screening recommended for you and due dates:  Health Maintenance  Topic Date Due  . Mammogram  04/08/2006  . DEXA scan (bone density measurement)  05/23/2010  . Pneumonia vaccines (1 of 2 - PCV13) 05/23/2010  . Colon Cancer Screening  05/19/2012  . Tetanus Vaccine  04/08/2013  . Flu Shot  11/06/2017  .  Hepatitis C: One time screening is recommended by Center for Disease Control  (CDC) for  adults born from [birth year] through 1965.   Completed

## 2018-06-04 NOTE — Assessment & Plan Note (Signed)
Refuses statin due to her husband experiencing liver damage from medication

## 2018-06-04 NOTE — Assessment & Plan Note (Signed)
TSH drawn today Current Levothyroxine regime- 255mcg daily 12.69mcg in addition to 285mcg on Tuesday and Thursday only Continue all other medications as directed. Please take Augmentin as directed for Right eat infection and sinusitis. Remain well hydrated and follow heart healthy diet. Fibromyalgia and stress/anxiety is likely causing increase in pain, recommend establishing with psychologist.   If you would like a referral, please call the clinic. Walk daily, strive for 15 minutes. Follow-up with various specialists as directed. Please schedule fasting labs in the next 1-2 weeks. Follow-up here in 6 months, sooner if needed.

## 2018-06-04 NOTE — Progress Notes (Signed)
Subjective:   Kayla [name] is a 73 y.o. female who presents for Medicare Annual (Subsequent) preventive examination.  Review of Systems:    Review of Systems  Constitutional: Positive for malaise/fatigue. Negative for chills, diaphoresis, fever and weight loss.  HENT: Positive for congestion and sinus pain.   Respiratory: Positive for shortness of breath. Negative for cough.   Cardiovascular: Negative for chest pain and palpitations.  Gastrointestinal: Negative for abdominal pain and nausea.  Genitourinary: Negative for flank pain.  Musculoskeletal: Positive for back pain, joint pain, myalgias and neck pain. Negative for falls.  Skin: Negative for itching and rash.  Neurological: Positive for weakness. Negative for dizziness.  Psychiatric/Behavioral: The patient is nervous/anxious.    Objective:     Vitals: BP (!) 156/88   Pulse 85   Temp 98.2 F (36.8 C) (Oral)   Ht 5' 3.75" (1.619 m)   Wt 225 lb 11.2 oz (102.4 kg)   SpO2 97% Comment: on RA  BMI 39.05 kg/m   Body mass index is 39.05 kg/m.  Advanced Directives 03/02/2018 12/20/2015 09/08/2015 07/01/2015 05/03/2015  Does Patient Have a Medical Advance Directive? _0   Would patient like information on creating a medical advance directive? - No - patient declined information - No - patient declined information Yes - Scientist, clinical (histocompatibility and immunogenetics) given    Tobacco Social History   Tobacco Use  Smoking Status Never Smoker  Smokeless Tobacco Never Used     Counseling given: Not Answered    Past Medical History:  Diagnosis Date  . Anemia   . Asthma   . B12 deficiency   . Chronic LBP   . Diastolic dysfunction   . Fibromyalgia   . GERD (gastroesophageal reflux disease)   . Hyperlipemia   . Hypertension   . Hypothyroid   . IBS (irritable bowel syndrome)   . S/P cardiac cath 11/30/2007   normal coronaries - Dr. Burt Knack (Dr. Debara Pickett reviewed films on 01/10/2016)   Past Surgical History:  Procedure Laterality Date  .  CESAREAN SECTION    . CHOLECYSTECTOMY    . NECK SURGERY  1998   Family History  Problem Relation Age of Onset  . Heart attack Father 62  . Lung cancer Other        uncle, non smoker  . Leukemia Other        uncle  . Cancer Mother        oral  . Stroke Mother   . Cancer Maternal Aunt        breast  . Alcohol abuse Maternal Aunt   . Alcohol abuse Maternal Uncle   . Cancer Maternal Uncle        lung, stomach, oral  . Cancer Paternal Uncle        lung  . Congestive Heart Failure Maternal Grandfather   . Heart attack Paternal Grandfather   . Cancer Paternal Uncle        bone  . Colon cancer Neg Hx   . Breast cancer Neg Hx   . Diabetes Neg Hx    Social History   Socioeconomic History  . Marital status: Widowed    Spouse name: Not on file  . Number of children: 2  . Years of education: Not on file  . Highest education level: Not on file  Occupational History  . Not on file  Social Needs  . Financial resource strain: Not on file  . Food insecurity:    Worry: Not  on file    Inability: Not on file  . Transportation needs:    Medical: Not on file    Non-medical: Not on file  Tobacco Use  . Smoking status: Never Smoker  . Smokeless tobacco: Never Used  Substance and Sexual Activity  . Alcohol use: No  . Drug use: No  . Sexual activity: Not on file  Lifestyle  . Physical activity:    Days per week: Not on file    Minutes per session: Not on file  . Stress: Not on file  Relationships  . Social connections:    Talks on phone: Not on file    Gets together: Not on file    Attends religious service: Not on file    Active member of club or organization: Not on file    Attends meetings of clubs or organizations: Not on file    Relationship status: Not on file  Other Topics Concern  . Not on file  Social History Narrative   Lost husband 2009, lost mother 2016    Outpatient Encounter Medications as of 06/04/2018  Medication Sig  . chlorhexidine gluconate, MEDLINE  KIT, (PERIDEX) 0.12 % solution Use as directed 15 mLs in the mouth or throat 2 (two) times daily.  . [facility] VITAMIN B12 1000 MCG tablet TAKE 1 TABLET (1,000 MCG TOTAL) BY MOUTH DAILY.  Marland Kitchen ketoconazole (NIZORAL) 2 % shampoo Apply 1 application topically 2 (two) times a week.  . levothyroxine (SYNTHROID, LEVOTHROID) 200 MCG tablet TAKE 1 TABLET (200 MCG TOTAL) BY MOUTH DAILY BEFORE BREAKFAST.  Marland Kitchen levothyroxine (SYNTHROID, LEVOTHROID) 25 MCG tablet TAKE 1/2 TABLET BY MOUTH DAILY ON TUESDAYS AND THURSDAYS (ALONG WITH LEVOTHYROXINE 200 MCG TAB)  . MATZIM LA 360 MG 24 hr tablet TAKE 1 TABLET (360 MG TOTAL) BY MOUTH DAILY. MUST KEEP UPCOMING APPT FOR FURTHER REFILLS  . Potassium 75 MG TABS Take 1 tablet daily by mouth.   . Vitamin D, Ergocalciferol, (DRISDOL) 50000 units CAPS capsule TAKE ONE CAPSULE BY MOUTH ONE TIME PER WEEK  . [DISCONTINUED] acetaminophen-codeine (TYLENOL #3) 300-30 MG tablet Take 1-2 tablets by mouth every 6 (six) hours as needed for moderate pain.  . [DISCONTINUED] omega-3 acid ethyl esters (LOVAZA) 1 g capsule TAKE 2 CAPSULES (2 G TOTAL) BY MOUTH 2 (TWO) TIMES DAILY.   No facility-administered encounter medications on file as of 06/04/2018.     Activities of Daily Living In your present state of health, do you have any difficulty performing the following activities: 06/04/2018  Hearing? Y  Vision? N  Difficulty concentrating or making decisions? N  Walking or climbing stairs? Y  Dressing or bathing? N  Doing errands, shopping? N  Some recent data might be hidden    Patient Care Team: [name], NP as PCP - General (Family Medicine) Buford Dresser, MD as PCP - Cardiology (Cardiology) Allyn Kenner, MD (Dermatology) Debara Pickett Nadean Corwin, MD as Consulting Physician (Cardiology) Lahoma Rocker, MD as Referring Physician (Rheumatology) [facility]    Assessment:   This is a routine wellness examination for Kayla [name].  Exercise Activities and Dietary  recommendations    Goals   None     Fall Risk Fall Risk  06/04/2018 04/28/2017 07/16/2016 01/16/2015 09/17/2012  Falls in the past year? 0 Yes No No No  Number falls in past yr: - 1 - - -  Injury with Fall? - No - - -   Is the patient's home free of loose throw rugs in walkways,  pet beds, electrical cords, etc?   yes      Grab bars in the bathroom? no      Handrails on the stairs?   yes      Adequate lighting?   yes  Timed Get Up and Go performed: 14 Seconds  Depression Screen PHQ 2/9 Scores 06/04/2018 12/02/2017 10/29/2017 04/28/2017  PHQ - 2 Score 1 0 0 1  PHQ- 9 Score _0 Cognitive Function     6CIT Screen 06/04/2018  What Year? 0 points  What month? 0 points  What time? 0 points  Count back from 20 0 points  Months in reverse 0 points  Repeat phrase 2 points  Total Score 2    Immunization History  Administered Date(s) Administered  . Pneumococcal Polysaccharide-23 04/25/2004  . Td 04/09/2003    Qualifies for Shingles Vaccine? Pt declined  Screening Tests Health Maintenance  Topic Date Due  . MAMMOGRAM  04/08/2006  . DEXA SCAN  05/23/2010  . PNA vac Low Risk Adult (1 of 2 - PCV13) 05/23/2010  . COLONOSCOPY  05/19/2012  . TETANUS/TDAP  04/08/2013  . INFLUENZA VACCINE  11/06/2017  . Hepatitis C Screening  Completed    Cancer Screenings: Lung: Low Dose CT Chest recommended if Age 9-80 years, 30 pack-year currently smoking OR have quit w/in 15years. Patient does not qualify. Breast:  Up to date on Mammogram? No   Up to date of Bone Density/Dexa? No Colorectal: Pt to schdule  Additional Screenings: Hepatitis C Screening: UTD 01/01/16, Neg    Plan:   TSH drawn today Current Levothyroxine regime- 215mg daily 12.560m in addition to 20051mon Tuesday and Thursday only Continue all other medications as directed. Please take Augmentin as directed for Right eat infection and sinusitis. Remain well hydrated and follow heart healthy diet. Fibromyalgia  and stress/anxiety is likely causing increase in pain, recommend establishing with psychologist.   If you would like a referral, please call the clinic. Walk daily, strive for 15 minutes. Follow-up with various specialists as directed. Please schedule fasting labs in the next 1-2 weeks. Follow-up here in 6 months, sooner if needed.  I have personally reviewed and noted the following in the patient's chart:   . Medical and social history . Use of alcohol, tobacco or illicit drugs  . Current medications and supplements . Functional ability and status . Nutritional status . Physical activity . Advanced directives . List of other physicians . Hospitalizations, surgeries, and ER visits in previous 12 months . Vitals . Screenings to include cognitive, depression, and falls . Referrals and appointments  In addition, I have reviewed and discussed with patient certain preventive protocols, quality metrics, and best practice recommendations. A written personalized care plan for preventive services as well as general preventive health recommendations were provided to patient.     KatEsaw GrandchildP  06/04/2018

## 2018-06-05 ENCOUNTER — Other Ambulatory Visit: Payer: Self-pay | Admitting: Adult Health

## 2018-06-05 DIAGNOSIS — E079 Disorder of thyroid, unspecified: Secondary | ICD-10-CM

## 2018-06-05 LAB — TSH: TSH: 0.092 u[IU]/mL — ABNORMAL LOW (ref 0.450–4.500)

## 2018-06-10 ENCOUNTER — Telehealth: Payer: Self-pay | Admitting: Cardiology

## 2018-06-10 NOTE — Telephone Encounter (Signed)
New Message   Patient states she's returning phone call about results.

## 2018-06-10 NOTE — Telephone Encounter (Signed)
Patient states she had a phone call this morning -requesting her to call back-  RN informed patient no documentation of phone call. Apologized .  patient also stated she had question about up coming sleep study- she states she has back issues and has to get up through the night to walk and patient wanted to know how the center would handle this issue. RN [place] # [phone]

## 2018-06-17 ENCOUNTER — Telehealth: Payer: Self-pay | Admitting: Adult Health

## 2018-06-17 NOTE — Telephone Encounter (Signed)
Patient called states she has seen Dr. Penelope Coop @ [facility] --- forwarding message to medical asst for review w/ provider. --- Please contact patient if there are any questions.  --glh

## 2018-06-24 ENCOUNTER — Institutional Professional Consult (permissible substitution): Payer: Medicare Other | Admitting: Emergency Medicine

## 2018-06-26 ENCOUNTER — Encounter (HOSPITAL_BASED_OUTPATIENT_CLINIC_OR_DEPARTMENT_OTHER): Payer: Self-pay | Admitting: *Deleted

## 2018-06-26 ENCOUNTER — Other Ambulatory Visit: Payer: Self-pay

## 2018-06-26 ENCOUNTER — Emergency Department (HOSPITAL_BASED_OUTPATIENT_CLINIC_OR_DEPARTMENT_OTHER)
Admission: EM | Admit: 2018-06-26 | Discharge: 2018-06-26 | Disposition: A | Discharge status: Home / Self Care | Payer: Medicare Other | Attending: Emergency Medicine | Admitting: Emergency Medicine

## 2018-06-26 ENCOUNTER — Emergency Department (HOSPITAL_BASED_OUTPATIENT_CLINIC_OR_DEPARTMENT_OTHER): Payer: Medicare Other

## 2018-06-26 DIAGNOSIS — I10 Essential (primary) hypertension: Secondary | ICD-10-CM | POA: Diagnosis not present

## 2018-06-26 DIAGNOSIS — J45909 Unspecified asthma, uncomplicated: Secondary | ICD-10-CM | POA: Insufficient documentation

## 2018-06-26 DIAGNOSIS — E039 Hypothyroidism, unspecified: Secondary | ICD-10-CM | POA: Diagnosis not present

## 2018-06-26 DIAGNOSIS — N83202 Unspecified ovarian cyst, left side: Secondary | ICD-10-CM | POA: Insufficient documentation

## 2018-06-26 DIAGNOSIS — N3001 Acute cystitis with hematuria: Secondary | ICD-10-CM | POA: Diagnosis not present

## 2018-06-26 DIAGNOSIS — R1031 Right lower quadrant pain: Secondary | ICD-10-CM | POA: Diagnosis present

## 2018-06-26 DIAGNOSIS — Z79899 Other long term (current) drug therapy: Secondary | ICD-10-CM | POA: Insufficient documentation

## 2018-06-26 DIAGNOSIS — N83201 Unspecified ovarian cyst, right side: Secondary | ICD-10-CM

## 2018-06-26 LAB — URINALYSIS, ROUTINE W REFLEX MICROSCOPIC
Bilirubin Urine: NEGATIVE
Glucose, UA: NEGATIVE mg/dL
Ketones, ur: 15 mg/dL — AB
Nitrite: NEGATIVE
Protein, ur: 100 mg/dL — AB
Specific Gravity, Urine: >1.03 — ABNORMAL HIGH (ref 1.005–1.030)
pH: 6 (ref 5.0–8.0)

## 2018-06-26 LAB — URINALYSIS, MICROSCOPIC (REFLEX): WBC, UA: >50 WBC/hpf (ref 0–5)

## 2018-06-26 MED ORDER — CEFTRIAXONE SODIUM 1 G IJ SOLR: 500.0000 mg | Freq: Once | INTRAMUSCULAR | Status: DC

## 2018-06-26 MED ORDER — CEPHALEXIN 500 MG PO CAPS: 500.0000 mg | ORAL_CAPSULE | Freq: Two times a day (BID) | ORAL | 0 refills | Status: AC

## 2018-06-26 MED ORDER — CEFTRIAXONE SODIUM 250 MG IJ SOLR
INTRAMUSCULAR | Status: AC
  Administered 2018-06-26: 500 mg
  Filled 2018-06-26: qty 500

## 2018-06-26 NOTE — Discharge Instructions (Addendum)
Your urine today did show signs of infection.  Your CT scan showed no evidence of kidney stone.  There was mention of an ovarian cyst.  This just needs to be followed up with your primary care doctor.  Take antibiotics as directed. Please take all of your antibiotics until finished.  Follow up with your primary care doctor.  Turn to emergency department for fever, nausea/vomiting, worsening pain or any other worsening or concerning symptoms.

## 2018-06-26 NOTE — ED Notes (Signed)
PT states understanding of care given, follow up care, and medication prescribed. PT ambulated from ED to car with a steady gait. 

## 2018-06-26 NOTE — ED Provider Notes (Signed)
[facility] EMERGENCY DEPARTMENT Provider Note   CSN: [id number] Arrival date & time: 06/26/18  1820    History   Chief Complaint Chief Complaint  Patient presents with  . Urinary Tract Infection    HPI Kayla [name] is a 73 y.o. female past 1 history of fibromyalgia, GERD, hypertension, hyperlipidemia who presents for evaluation of dysuria, hematuria, increased urinary frequency.  She states that symptoms began yesterday.  She reports some associated lower abdominal pressure that radiates around to her back.  She states that this pain has worsened over last 24 hours.  She states that since this afternoon, she felt like she has had decreased urine output.  She has noticed some blood in her urine.  She states she has not had any nausea/vomiting.  She says she has a history of UTIs and states that this feels similar.  Patient denies any fever, difficulty breathing, chest pain.     The history is provided by the patient.    Past Medical History:  Diagnosis Date  . Anemia   . Asthma   . B12 deficiency   . Chronic LBP   . Diastolic dysfunction   . Fibromyalgia   . GERD (gastroesophageal reflux disease)   . Hyperlipemia   . Hypertension   . Hypothyroid   . IBS (irritable bowel syndrome)   . S/P cardiac cath 11/30/2007   normal coronaries - Dr. Burt Knack (Dr. Debara Pickett reviewed films on 01/10/2016)    Patient Active Problem List   Diagnosis Date Noted  . Encounter for Medicare annual wellness exam 06/04/2018  . Snoring 06/01/2018  . Costochondritis 06/01/2018  . Family history of heart disease 06/01/2018  . Class 2 severe obesity due to excess calories with serious comorbidity and body mass index (BMI) of 38.0 to 38.9 in adult ([place]) 06/01/2018  . Hematuria 04/28/2017  . Multiple atypical skin moles 04/28/2017  . Swollen lymph nodes 04/28/2017  . Screening for colon cancer 04/28/2017  . Healthcare maintenance 04/28/2017  . Shakiness 11/14/2016  . Otitis media of left ear  09/11/2016  . Depression, recurrent ([place]) 07/16/2016  . Adjustment disorder with mixed anxiety and depressed mood 01/15/2016  . Fe Def Anemia 12/22/2015  . Scoliosis (and kyphoscoliosis), idiopathic 12/20/2015  . Osteopenia 12/20/2015  . PCP NOTES >>> 01/22/2015  . Fibromyalgia 08/02/2014  . DOE (dyspnea on exertion)- chronic 07/07/2014  . GAD (generalized anxiety disorder) 09/17/2012  . UTI (lower urinary tract infection) 01/24/2012  . Vitamin D deficiency 01/24/2012  . Insomnia 01/24/2012  . Fatigue 10/14/2011  . Neck pain 07/23/2010  . Screening for breast cancer 07/23/2010  . B12 deficiency   . Hyperlipidemia 11/30/2007  . Coronary artery disease involving native heart without angina pectoris 11/30/2007  . PALPITATIONS, OCCASIONAL 04/15/2007  . Thyroid disease 01/27/2007  . Essential hypertension 01/27/2007  . Reactive airway disease that is not asthma 01/27/2007  . GERD 01/27/2007  . IBS 01/27/2007  . BACK PAIN, CHRONIC 01/27/2007    Past Surgical History:  Procedure Laterality Date  . CESAREAN SECTION    . CHOLECYSTECTOMY    . NECK SURGERY  1998     OB History   No obstetric history on file.      Home Medications    Prior to Admission medications   Medication Sig Start Date End Date Taking? Authorizing Provider  amoxicillin-clavulanate (AUGMENTIN) 875-125 MG tablet Take 1 tablet by mouth 2 (two) times daily. 06/04/18  Yes Danford, Berna Spare, NP  chlorhexidine gluconate, MEDLINE KIT, (  PERIDEX) 0.12 % solution Use as directed 15 mLs in the mouth or throat 2 (two) times daily. 03/02/18  Yes Isla Pence, MD  [facility]S VITAMIN B12 1000 MCG tablet TAKE 1 TABLET (1,000 MCG TOTAL) BY MOUTH DAILY. 03/24/18  Yes Danford, Valetta Fuller D, NP  ketoconazole (NIZORAL) 2 % shampoo Apply 1 application topically 2 (two) times a week. 10/30/17  Yes Danford, Valetta Fuller D, NP  levothyroxine (SYNTHROID, LEVOTHROID) 200 MCG tablet TAKE 1 TABLET (200 MCG TOTAL) BY MOUTH DAILY BEFORE BREAKFAST. 04/02/18   Yes Danford, Katy D, NP  MATZIM LA 360 MG 24 hr tablet TAKE 1 TABLET (360 MG TOTAL) BY MOUTH DAILY. MUST KEEP UPCOMING APPT FOR FURTHER REFILLS 04/06/18  Yes Hilty, Nadean Corwin, MD  Potassium 75 MG TABS Take 1 tablet daily by mouth.    Yes [provider]  Vitamin D, Ergocalciferol, (DRISDOL) 50000 units CAPS capsule TAKE ONE CAPSULE BY MOUTH ONE TIME PER WEEK 11/27/17  Yes Danford, Valetta Fuller D, NP  cephALEXin (KEFLEX) 500 MG capsule Take 1 capsule (500 mg total) by mouth 2 (two) times daily for 7 days. 06/26/18 07/03/18  Volanda Napoleon, PA-C    Family History Family History  Problem Relation Age of Onset  . Heart attack Father 67  . Lung cancer Other        uncle, non smoker  . Leukemia Other        uncle  . Cancer Mother        oral  . Stroke Mother   . Cancer Maternal Aunt        breast  . Alcohol abuse Maternal Aunt   . Alcohol abuse Maternal Uncle   . Cancer Maternal Uncle        lung, stomach, oral  . Cancer Paternal Uncle        lung  . Congestive Heart Failure Maternal Grandfather   . Heart attack Paternal Grandfather   . Cancer Paternal Uncle        bone  . Colon cancer Neg Hx   . Breast cancer Neg Hx   . Diabetes Neg Hx     Social History Social History   Tobacco Use  . Smoking status: Never Smoker  . Smokeless tobacco: Never Used  Substance Use Topics  . Alcohol use: No  . Drug use: No     Allergies   Ace inhibitors and Lisinopril   Review of Systems Review of Systems  Constitutional: Negative for fever.  Respiratory: Negative for shortness of breath.   Cardiovascular: Negative for chest pain.  Gastrointestinal: Negative for abdominal pain, nausea and vomiting.  Genitourinary: Positive for dysuria, frequency and hematuria.  Neurological: Negative for headaches.  All other systems reviewed and are negative.    Physical Exam Updated Vital Signs BP (!) 163/87 (BP Location: Right Arm)   Pulse 63   Temp 99.3 F (37.4 C) (Oral)   Resp 18    Ht 5' 3.5" (1.613 m)   Wt 99.8 kg   SpO2 99%   BMI 38.36 kg/m   Physical Exam Vitals signs and nursing note reviewed.  Constitutional:      Appearance: Normal appearance. She is well-developed.  HENT:     Head: Normocephalic and atraumatic.  Eyes:     General: Lids are normal.     Conjunctiva/sclera: Conjunctivae normal.     Pupils: Pupils are equal, round, and reactive to light.  Neck:     Musculoskeletal: Full passive range of motion without pain.  Cardiovascular:  Rate and Rhythm: Normal rate and regular rhythm.     Pulses: Normal pulses.     Heart sounds: Normal heart sounds. No murmur. No friction rub. No gallop.   Pulmonary:     Effort: Pulmonary effort is normal.     Breath sounds: Normal breath sounds.     Comments: Lungs clear to auscultation bilaterally.  Symmetric chest rise.  No wheezing, rales, rhonchi. Abdominal:     Palpations: Abdomen is soft. Abdomen is not rigid.     Tenderness: There is no abdominal tenderness. There is right CVA tenderness. There is no guarding.     Comments: Mild right-sided CVA tenderness.  Abdomen is soft, nondistended.  No tenderness noted.  Musculoskeletal: Normal range of motion.  Skin:    General: Skin is warm and dry.     Capillary Refill: Capillary refill takes less than 2 seconds.  Neurological:     Mental Status: She is alert and oriented to person, place, and time.  Psychiatric:        Speech: Speech normal.      ED Treatments / Results  Labs (all labs ordered are listed, but only abnormal results are displayed) Labs Reviewed  URINALYSIS, ROUTINE W REFLEX MICROSCOPIC - Abnormal; Notable for the following components:      Result Value   APPearance CLOUDY (*)    Specific Gravity, Urine >1.030 (*)    Hgb urine dipstick LARGE (*)    Ketones, ur 15 (*)    Protein, ur 100 (*)    Leukocytes,Ua SMALL (*)    All other components within normal limits  URINALYSIS, MICROSCOPIC (REFLEX) - Abnormal; Notable for the following  components:   Bacteria, UA FEW (*)    All other components within normal limits  URINE CULTURE    EKG None  Radiology Ct Renal Stone Study  Result Date: 06/26/2018 CLINICAL DATA:  UTIs and hematuria EXAM: CT ABDOMEN AND PELVIS WITHOUT CONTRAST TECHNIQUE: Multidetector CT imaging of the abdomen and pelvis was performed following the standard protocol without IV contrast. COMPARISON:  None. FINDINGS: Lower chest: No acute abnormality. Hepatobiliary: No focal liver abnormality is seen. Status post cholecystectomy. No biliary dilatation. Pancreas: Unremarkable. No pancreatic ductal dilatation or surrounding inflammatory changes. Spleen: Normal in size without focal abnormality. Adrenals/Urinary Tract: Adrenal glands are within normal limits. Kidneys are well visualized bilaterally without renal calculi or urinary tract obstructive changes. The bladder is decompressed. Stomach/Bowel: Scattered diverticular change of the colon is noted without evidence of diverticulitis. The appendix is within normal limits. No small bowel or gastric abnormality is seen. Vascular/Lymphatic: Aortic atherosclerosis. No enlarged abdominal or pelvic lymph nodes. Reproductive: Uterus is within normal limits. A dominant left ovarian cyst is noted measuring 4.5 cm. This appears simple in nature. No right adnexal lesion is seen. Other: No abdominal wall hernia or abnormality. No abdominopelvic ascites. Musculoskeletal: Degenerative changes of lumbar spine are noted. IMPRESSION: Diverticulosis without diverticulitis. Left ovarian cyst as described. Follow-up ultrasound in 1 year is recommended to assess for stability. Electronically Signed   By: Inez Catalina M.D.   On: 06/26/2018 20:02    Procedures Procedures (including critical care time)  Medications Ordered in ED Medications  cefTRIAXone (ROCEPHIN) injection 500 mg (has no administration in time range)  cefTRIAXone (ROCEPHIN) 250 MG injection (500 mg  Given 06/26/18 1957)      Initial Impression / Assessment and Plan / ED Course  I have reviewed the triage vital signs and the nursing notes.  Pertinent labs &  imaging results that were available during my care of the patient were reviewed by me and considered in my medical decision making (see chart for details).        73 year old female who presents for evaluation of dysuria, hematuria, increased urinary frequency that began yesterday.  No associated fevers, nausea/vomiting.  History of UTIs and states that this feels similar. Patient is afebrile, non-toxic appearing, sitting comfortably on examination table. Vital signs reviewed and stable.  Exam, she has some mild right-sided CVA tenderness.  Plan to check UA.  Additionally, given CVA tenderness, will plan for renal study for evaluation of kidney stone.   UA shows large hemoglobin, small leukocytes, pyuria bacteria.  There is 0-5 squamous epithelium.  We will plan to treat as UTI.  Renal study shows no evidence of kidney stone. There is mention of left left ovarian cyst.  Otherwise unremarkable.  Discussed results with patient.  We will plan to give dose of antibiotics here as well as send her home with antibiotics.  Instructed patient to follow-up with PCP. At this time, patient exhibits no emergent life-threatening condition that require further evaluation in ED or admission. Patient had ample opportunity for questions and discussion. All patient's questions were answered with full understanding. Strict return precautions discussed. Patient expresses understanding and agreement to plan.   Portions of this note were generated with Lobbyist. Dictation errors may occur despite best attempts at proofreading.   Final Clinical Impressions(s) / ED Diagnoses   Final diagnoses:  Acute cystitis with hematuria  Cyst of right ovary    ED Discharge Orders         Ordered    cephALEXin (KEFLEX) 500 MG capsule  2 times daily     06/26/18 2026            Desma Mcgregor 06/26/18 2310    Little, Wenda Overland, MD 06/26/18 2330

## 2018-06-26 NOTE — ED Triage Notes (Signed)
States she has a really bad UTI.

## 2018-06-28 LAB — URINE CULTURE
Culture: <10000 — AB
Special Requests: NORMAL

## 2018-07-03 ENCOUNTER — Other Ambulatory Visit: Payer: Self-pay | Admitting: Internal Medicine

## 2018-07-06 ENCOUNTER — Telehealth: Payer: Self-pay | Admitting: Adult Health

## 2018-07-06 DIAGNOSIS — K589 Irritable bowel syndrome without diarrhea: Secondary | ICD-10-CM

## 2018-07-06 DIAGNOSIS — K219 Gastro-esophageal reflux disease without esophagitis: Secondary | ICD-10-CM

## 2018-07-06 NOTE — Telephone Encounter (Signed)
LVM for pt to return call to discuss symptoms.  Charyl Bigger, CMA

## 2018-07-06 NOTE — Telephone Encounter (Signed)
Pt c/o left otalgia just below the ear and radiating into the left jaw area.  She is also experiencing sinus pressure around the left eye and has post nasal drainage.  Pt states that she has not checked her temperature but feels that she has felt "warm".  Denies nasal drainage, cough, or drainage from the ear.  Pt describes the pain as a "pressure feeling".  Please advise.    I have ordered referral to Dr. Estell Harpin office.    Charyl Bigger, CMA

## 2018-07-06 NOTE — Telephone Encounter (Signed)
Good Afternoon [name], Please have [name] schedule an E-visit for tomorrow Can she please check her BP/HR/oral temp prior "OV" tomorrow  Thanks! Valetta Fuller

## 2018-07-06 NOTE — Addendum Note (Signed)
Addended by: Fonnie Mu on: 07/06/2018 04:18 PM   Modules accepted: Orders

## 2018-07-06 NOTE — Telephone Encounter (Signed)
Patient was seen a few weeks ago for an ear infection and has an abx sent in from [facility]. Patient states that it is coming back and is requesting for a refill of this abx if possible. Please advise if this is something we can do and she is requesting a call back regardless so she would know to go to pharm if there is an order sent (she says to leave her a VM if she does not answer).  Also she says that there was supposed to be a gastro referral placed for Dr. Penelope Coop at [facility] but never heard anything. Please place referral if applicable.

## 2018-07-06 NOTE — Telephone Encounter (Signed)
RX Refill 

## 2018-07-07 ENCOUNTER — Other Ambulatory Visit: Payer: Self-pay

## 2018-07-07 ENCOUNTER — Ambulatory Visit (INDEPENDENT_AMBULATORY_CARE_PROVIDER_SITE_OTHER): Payer: Medicare Other | Admitting: Adult Health

## 2018-07-07 ENCOUNTER — Encounter: Payer: Self-pay | Admitting: Adult Health

## 2018-07-07 DIAGNOSIS — J320 Chronic maxillary sinusitis: Secondary | ICD-10-CM | POA: Diagnosis not present

## 2018-07-07 DIAGNOSIS — E079 Disorder of thyroid, unspecified: Secondary | ICD-10-CM | POA: Diagnosis not present

## 2018-07-07 MED ORDER — FLUTICASONE PROPIONATE 50 MCG/ACT NA SUSP: 2.0000 | Freq: Every day | NASAL | 2 refills | Status: DC

## 2018-07-07 MED ORDER — DOXYCYCLINE HYCLATE 100 MG PO TABS: 100.0000 mg | ORAL_TABLET | Freq: Two times a day (BID) | ORAL | 0 refills | Status: DC

## 2018-07-07 NOTE — Assessment & Plan Note (Signed)
She denies palpitations/tachycardia/increase in shakiness She has not followed up with Endocrinologist as directed, but agreeable to coming in in 3 weeks for TSH level check She verbalized that she is only taking Levothyroxine 241mcg in am with water hrs before any other medications.

## 2018-07-07 NOTE — Progress Notes (Signed)
Virtual Visit via Telephone Note  I connected with Kayla [name] on 07/07/18 at  4:15 PM EDT by telephone and verified that I am speaking with the correct person using two identifiers.   I discussed the limitations, risks, security and privacy concerns of performing an evaluation and management service by telephone and the availability of in person appointments. The clerical staff discussed with the patient that there may be a patient responsible charge related to this service. The patient expressed understanding and agreed to proceed.   History of Present Illness: Kayla [name] calls in today with L sided facial pressure/edema/pain that abruptly started 48 hrs ago She rates pain 7/10, described as "pressure" She also reports frontal HA, rated 8/10 She has been using OTC Acetaminophen with moderate sx relief She has hx of re-current sinusitis  She denies fever/cough/N/V/D She denies increase in dyspnea She denies tobacco/vape use She has been staying at home, her family has been running all of her errands for her  Observations/Objective: No acute distress noted during telephone conversation  Assessment and Plan: Since she completed course of Augmentin last month, will use course of Doxycycline and Flonase Verified all current medications She has not followed up with Endocrinologist as directed, but agreeable to coming in in 3 weeks for TSH level check She verbalized that she is only taking Levothyroxine 28mcg in am with water hrs before any other medications.   Follow Up Instructions: I discussed the assessment and treatment plan with the patient. The patient was provided an opportunity to ask questions and all were answered. The patient agreed with the plan and demonstrated an understanding of the instructions.   The patient was advised to call back or seek an in-person evaluation if the symptoms worsen or if the condition fails to improve as anticipated.  I provided 15 minutes of  non-face-to-face time during this encounter.   [name], NP

## 2018-07-07 NOTE — Assessment & Plan Note (Signed)
Assessment and Plan: Since she completed course of Augmentin last month, will use course of Doxycycline and Flonase Verified all current medications Follow Up Instructions: I discussed the assessment and treatment plan with the patient. The patient was provided an opportunity to ask questions and all were answered. The patient agreed with the plan and demonstrated an understanding of the instructions.   The patient was advised to call back or seek an in-person evaluation if the symptoms worsen or if the condition fails to improve as anticipated.  I provided 15 minutes of non-face-to-face time during this encounter.

## 2018-07-07 NOTE — Telephone Encounter (Signed)
LVM for pt to call to schedule e-visit.  Charyl Bigger, CMA

## 2018-07-08 ENCOUNTER — Institutional Professional Consult (permissible substitution): Payer: Medicare Other | Admitting: Emergency Medicine

## 2018-07-27 ENCOUNTER — Encounter: Payer: Self-pay | Admitting: Adult Health

## 2018-07-27 ENCOUNTER — Other Ambulatory Visit: Payer: Self-pay

## 2018-07-27 ENCOUNTER — Ambulatory Visit (INDEPENDENT_AMBULATORY_CARE_PROVIDER_SITE_OTHER): Payer: Medicare Other | Admitting: Adult Health

## 2018-07-27 DIAGNOSIS — E079 Disorder of thyroid, unspecified: Secondary | ICD-10-CM | POA: Diagnosis not present

## 2018-07-27 DIAGNOSIS — R5383 Other fatigue: Secondary | ICD-10-CM | POA: Diagnosis not present

## 2018-07-27 NOTE — Assessment & Plan Note (Signed)
  Assessment and Plan: Continue all medications as directed Low impact exercise, as tolerated, 15 minutes/day- walking, seated stretching/light weight lifting, stationary bike Call Endocrinologist next week to schedule new pt appt Keep Pulmonology appt Schedule Cardiology f/u May 2020- provided [facility] contact information Added Anemia panel to lab appt tomorrow  Follow Up Instructions: PRN   I discussed the assessment and treatment plan with the patient. The patient was provided an opportunity to ask questions and all were answered. The patient agreed with the plan and demonstrated an understanding of the instructions.   The patient was advised to call back or seek an in-person evaluation if the symptoms worsen or if the condition fails to improve as anticipated.

## 2018-07-27 NOTE — Assessment & Plan Note (Signed)
Heating pad Daily stretching Increase daily walking

## 2018-07-27 NOTE — Assessment & Plan Note (Signed)
Will obtain TSH tomorrow She reports taking Levothyroxine 294mcg every am with water only Due to medication non-compliance and her confusion on rx dosing it has been next to impossible to manage her thyroid disorder She has been referred to Endocrinology for management, however she has never established with specialist. She intends to call Endocrinologist next week to schedule new pt appt with specialist- referral was placed July 2019

## 2018-07-27 NOTE — Progress Notes (Signed)
Virtual Visit via Telephone Note  I connected with [name] on 07/27/18 at  1:15 PM EDT by telephone and verified that I am speaking with the correct person using two identifiers.   I discussed the limitations, risks, security and privacy concerns of performing an evaluation and management service by telephone and the availability of in person appointments. The staff discussed with the patient that there may be a patient responsible charge related to this service. The patient expressed understanding and agreed to proceed.   History of Present Illness: [name] calls in today with with several concerns: 1) severe fatigue, she states "I can sleep for 8 hrs then go right back to sleep" She reports taking Levothyroxine 273mg every morning with water only. She denies CP/palpitations 2) Chronic pain, she states "my back hurts all the time, it is even difficult for me to keep the house clean" She denies regular exercise She denies recent acute injury or fall 3) Continued dyspnea- she has hx of reactive airway disease that is not asthma She reports strictly remaining home the last 4 weeks She is not using tobacco/vape products She has Pulmonology appt July 2020 4) She was seen at ED 06/2018 for UTI and wants to discuss CT results- 06/26/2018 CT Renal Study IMPRESSION: Diverticulosis without diverticulitis. Left ovarian cyst as described. Follow-up ultrasound in 1 year is recommended to assess for stability.  Of Note- Due to medication non-compliance and her confusion on rx dosing it has been next to impossible to manage her thyroid disorder She has been referred to Endocrinology for management, however she has never established with specialist. She intends to call Endocrinologist next week to schedule new pt appt with specialist- referral was placed July 2019  Patient Care Team    Relationship Specialty Notifications Start End  DMina MarbleD, NP PCP - General Family Medicine  11/14/16    CBuford Dresser MD PCP - Cardiology Cardiology Admissions 03/26/18   HAllyn Kenner MD  Dermatology  12/20/15   HPixie Casino MD Consulting Physician Cardiology  01/01/16   ALahoma Rocker MD Referring Physician Rheumatology  01/01/16    Comment: fibromyalgia/  and osteopania and scolisois  [facility]    08/04/17     Patient Active Problem List   Diagnosis Date Noted  . Chronic maxillary sinusitis 07/07/2018  . Encounter for Medicare annual wellness exam 06/04/2018  . Snoring 06/01/2018  . Costochondritis 06/01/2018  . Family history of heart disease 06/01/2018  . Class 2 severe obesity due to excess calories with serious comorbidity and body mass index (BMI) of 38.0 to 38.9 in adult [place] 06/01/2018  . Hematuria 04/28/2017  . Multiple atypical skin moles 04/28/2017  . Swollen lymph nodes 04/28/2017  . Screening for colon cancer 04/28/2017  . Healthcare maintenance 04/28/2017  . Shakiness 11/14/2016  . Otitis media of left ear 09/11/2016  . Depression, recurrent [place] 07/16/2016  . Adjustment disorder with mixed anxiety and depressed mood 01/15/2016  . Fe Def Anemia 12/22/2015  . Scoliosis (and kyphoscoliosis), idiopathic 12/20/2015  . Osteopenia 12/20/2015  . PCP NOTES >>> 01/22/2015  . Fibromyalgia 08/02/2014  . DOE (dyspnea on exertion)- chronic 07/07/2014  . GAD (generalized anxiety disorder) 09/17/2012  . UTI (lower urinary tract infection) 01/24/2012  . Vitamin D deficiency 01/24/2012  . Insomnia 01/24/2012  . Fatigue 10/14/2011  . Neck pain 07/23/2010  . Screening for breast cancer 07/23/2010  . B12 deficiency   . Hyperlipidemia 11/30/2007  . Coronary artery  disease involving native heart without angina pectoris 11/30/2007  . PALPITATIONS, OCCASIONAL 04/15/2007  . Thyroid disease 01/27/2007  . Essential hypertension 01/27/2007  . Reactive airway disease that is not asthma 01/27/2007  . GERD 01/27/2007  . IBS 01/27/2007  . BACK  PAIN, CHRONIC 01/27/2007     Past Medical History:  Diagnosis Date  . Anemia   . Asthma   . B12 deficiency   . Chronic LBP   . Diastolic dysfunction   . Fibromyalgia   . GERD (gastroesophageal reflux disease)   . Hyperlipemia   . Hypertension   . Hypothyroid   . IBS (irritable bowel syndrome)   . S/P cardiac cath 11/30/2007   normal coronaries - Dr. Burt Knack (Dr. Debara Pickett reviewed films on 01/10/2016)     Past Surgical History:  Procedure Laterality Date  . CESAREAN SECTION    . CHOLECYSTECTOMY    . NECK SURGERY  1998     Family History  Problem Relation Age of Onset  . Heart attack Father 45  . Lung cancer Other        uncle, non smoker  . Leukemia Other        uncle  . Cancer Mother        oral  . Stroke Mother   . Cancer Maternal Aunt        breast  . Alcohol abuse Maternal Aunt   . Alcohol abuse Maternal Uncle   . Cancer Maternal Uncle        lung, stomach, oral  . Cancer Paternal Uncle        lung  . Congestive Heart Failure Maternal Grandfather   . Heart attack Paternal Grandfather   . Cancer Paternal Uncle        bone  . Colon cancer Neg Hx   . Breast cancer Neg Hx   . Diabetes Neg Hx      Social History   Substance and Sexual Activity  Drug Use No     Social History   Substance and Sexual Activity  Alcohol Use No     Social History   Tobacco Use  Smoking Status Never Smoker  Smokeless Tobacco Never Used     Outpatient Encounter Medications as of 07/27/2018  Medication Sig  . calcium-vitamin D (OSCAL WITH D) 250-125 MG-UNIT tablet Take 1 tablet by mouth daily.  . chlorhexidine gluconate, MEDLINE KIT, (PERIDEX) 0.12 % solution Use as directed 15 mLs in the mouth or throat 2 (two) times daily.  . [facility] VITAMIN B12 1000 MCG tablet TAKE 1 TABLET (1,000 MCG TOTAL) BY MOUTH DAILY.  Marland Kitchen ketoconazole (NIZORAL) 2 % shampoo Apply 1 application topically 2 (two) times a week.  . levothyroxine (SYNTHROID, LEVOTHROID) 200 MCG tablet TAKE 1 TABLET  (200 MCG TOTAL) BY MOUTH DAILY BEFORE BREAKFAST.  Marland Kitchen MATZIM LA 360 MG 24 hr tablet TAKE 1 TABLET (360 MG TOTAL) BY MOUTH DAILY. MUST KEEP UPCOMING APPT FOR FURTHER REFILLS  . Potassium 75 MG TABS Take 1 tablet daily by mouth.   . Vitamin D, Ergocalciferol, (DRISDOL) 50000 units CAPS capsule TAKE ONE CAPSULE BY MOUTH ONE TIME PER WEEK  . [DISCONTINUED] doxycycline (VIBRA-TABS) 100 MG tablet Take 1 tablet (100 mg total) by mouth 2 (two) times daily.  . [DISCONTINUED] fluticasone (FLONASE) 50 MCG/ACT nasal spray Place 2 sprays into both nostrils daily.   No facility-administered encounter medications on file as of 07/27/2018.     Allergies: Ace inhibitors and Lisinopril  There is no height or  weight on file to calculate BMI.  There were no vitals taken for this visit.    Observations/Objective: Very mild dyspnea noted during telephone conversation but this is been her baseline   Assessment and Plan: Continue all medications as directed Low impact exercise, as tolerated, 15 minutes/day- walking, seated stretching/light weight lifting, stationary bike Call Endocrinologist next week to schedule new pt appt Keep Pulmonology appt Schedule Cardiology f/u May 2020- provided [facility] contact information Added Anemia panel to lab appt tomorrow  Follow Up Instructions: PRN   I discussed the assessment and treatment plan with the patient. The patient was provided an opportunity to ask questions and all were answered. The patient agreed with the plan and demonstrated an understanding of the instructions.   The patient was advised to call back or seek an in-person evaluation if the symptoms worsen or if the condition fails to improve as anticipated.  I provided 25 minutes of non-face-to-face time during this encounter.   [name], NP

## 2018-07-28 ENCOUNTER — Other Ambulatory Visit: Payer: Self-pay

## 2018-07-28 ENCOUNTER — Other Ambulatory Visit (INDEPENDENT_AMBULATORY_CARE_PROVIDER_SITE_OTHER): Payer: Medicare Other

## 2018-07-28 DIAGNOSIS — R5383 Other fatigue: Secondary | ICD-10-CM

## 2018-07-28 DIAGNOSIS — E079 Disorder of thyroid, unspecified: Secondary | ICD-10-CM

## 2018-07-28 DIAGNOSIS — E538 Deficiency of other specified B group vitamins: Secondary | ICD-10-CM

## 2018-07-29 ENCOUNTER — Other Ambulatory Visit: Payer: Self-pay | Admitting: Adult Health

## 2018-07-29 DIAGNOSIS — E079 Disorder of thyroid, unspecified: Secondary | ICD-10-CM

## 2018-07-29 LAB — VITAMIN B12: Vitamin B-12: 551 pg/mL (ref 232–1245)

## 2018-07-29 LAB — IRON AND TIBC
Iron Saturation: 28 % (ref 15–55)
Iron: 82 ug/dL (ref 27–139)
Total Iron Binding Capacity: 293 ug/dL (ref 250–450)
UIBC: 211 ug/dL (ref 118–369)

## 2018-07-29 LAB — FERRITIN: Ferritin: 125 ng/mL (ref 15–150)

## 2018-07-29 LAB — FOLATE: Folate: 4.4 ng/mL

## 2018-07-29 LAB — T4, FREE: Free T4: 1.87 ng/dL — ABNORMAL HIGH (ref 0.82–1.77)

## 2018-07-29 LAB — TSH: TSH: 0.171 u[IU]/mL — ABNORMAL LOW (ref 0.450–4.500)

## 2018-07-29 LAB — T3: T3, Total: 116 ng/dL (ref 71–180)

## 2018-07-29 MED ORDER — LEVOTHYROXINE SODIUM 175 MCG PO TABS: 175.0000 ug | ORAL_TABLET | Freq: Every day | ORAL | 0 refills | Status: DC

## 2018-08-01 ENCOUNTER — Encounter (HOSPITAL_BASED_OUTPATIENT_CLINIC_OR_DEPARTMENT_OTHER): Payer: Self-pay | Admitting: *Deleted

## 2018-08-01 ENCOUNTER — Other Ambulatory Visit: Payer: Self-pay

## 2018-08-01 ENCOUNTER — Emergency Department (HOSPITAL_BASED_OUTPATIENT_CLINIC_OR_DEPARTMENT_OTHER)
Admission: EM | Admit: 2018-08-01 | Discharge: 2018-08-01 | Disposition: A | Discharge status: Home / Self Care | Payer: Medicare Other | Attending: Emergency Medicine | Admitting: Emergency Medicine

## 2018-08-01 DIAGNOSIS — E039 Hypothyroidism, unspecified: Secondary | ICD-10-CM | POA: Diagnosis not present

## 2018-08-01 DIAGNOSIS — Z79899 Other long term (current) drug therapy: Secondary | ICD-10-CM | POA: Diagnosis not present

## 2018-08-01 DIAGNOSIS — I259 Chronic ischemic heart disease, unspecified: Secondary | ICD-10-CM | POA: Diagnosis not present

## 2018-08-01 DIAGNOSIS — J45909 Unspecified asthma, uncomplicated: Secondary | ICD-10-CM | POA: Diagnosis not present

## 2018-08-01 DIAGNOSIS — R300 Dysuria: Secondary | ICD-10-CM | POA: Diagnosis present

## 2018-08-01 DIAGNOSIS — I10 Essential (primary) hypertension: Secondary | ICD-10-CM | POA: Diagnosis not present

## 2018-08-01 DIAGNOSIS — N3000 Acute cystitis without hematuria: Secondary | ICD-10-CM | POA: Insufficient documentation

## 2018-08-01 LAB — URINALYSIS, ROUTINE W REFLEX MICROSCOPIC
Bilirubin Urine: NEGATIVE
Glucose, UA: NEGATIVE mg/dL
Ketones, ur: NEGATIVE mg/dL
Nitrite: NEGATIVE
Protein, ur: 30 mg/dL — AB
Specific Gravity, Urine: 1.025 (ref 1.005–1.030)
pH: 5.5 (ref 5.0–8.0)

## 2018-08-01 LAB — URINALYSIS, MICROSCOPIC (REFLEX): WBC, UA: >50 WBC/hpf (ref 0–5)

## 2018-08-01 MED ORDER — CEFTRIAXONE SODIUM 250 MG IJ SOLR
INTRAMUSCULAR | Status: AC
  Administered 2018-08-01: 20:00:00 500 mg
  Filled 2018-08-01: qty 500

## 2018-08-01 MED ORDER — CEPHALEXIN 500 MG PO CAPS: 500.0000 mg | ORAL_CAPSULE | Freq: Two times a day (BID) | ORAL | 0 refills | Status: DC

## 2018-08-01 MED ORDER — CEFTRIAXONE SODIUM 1 G IJ SOLR: 500.0000 mg | Freq: Once | INTRAMUSCULAR | Status: DC

## 2018-08-01 NOTE — Discharge Instructions (Signed)
Please read and follow all provided instructions.  Your diagnoses today include:  1. Acute cystitis without hematuria     Tests performed today include:  Urine test - suggests that you have an infection in your bladder  Vital signs. See below for your results today.   Medications prescribed:   Keflex (cephalexin) - antibiotic  You have been prescribed an antibiotic medicine: take the entire course of medicine even if you are feeling better. Stopping early can cause the antibiotic not to work.  Home care instructions:  Follow any educational materials contained in this packet.  Follow-up instructions: Please follow-up with your primary care provider in 7 days if symptoms are not resolved for further evaluation of your symptoms.  Return instructions:   Please return to the Emergency Department if you experience worsening symptoms.   Return with fever, worsening pain, persistent vomiting, worsening pain in your back.   Please return if you have any other emergent concerns.  Additional Information:  Your vital signs today were: BP (!) 143/81 (BP Location: Left Arm)    Pulse 67    Temp 98.6 F (37 C) (Oral)    Resp 18    Ht 5\' 4"  (1.626 m)    Wt 99.8 kg    SpO2 99%    BMI 37.76 kg/m  If your blood pressure (BP) was elevated above 135/85 this visit, please have this repeated by your doctor within one month. --------------

## 2018-08-01 NOTE — ED Notes (Signed)
ED Provider at bedside. 

## 2018-08-01 NOTE — ED Provider Notes (Signed)
[facility] Provider Note   CSN: [id number] Arrival date & time: 08/01/18  1924    History   Chief Complaint Chief Complaint  Patient presents with  . Dysuria    HPI Kayla [name] is a 73 y.o. female.     Patient with history of recurrent urinary tract infections presents the emergency department today with complaint of suprapubic pain and dysuria starting today.  Patient was seen in the emergency department about a month ago with similar symptoms.  She had a CT scan at that time which was negative except for a left ovarian cyst noted incidentally.  Patient states that she was treated with IM Rocephin and the next morning felt a lot better.  She completed a course of Keflex.  Minimal growth on urine culture performed at that time.  Patient denies fevers, nausea, vomiting, back pain.  She states that this does not feel like a kidney infection.  Onset of symptoms acute.  Course is constant.     Past Medical History:  Diagnosis Date  . Anemia   . Asthma   . B12 deficiency   . Chronic LBP   . Diastolic dysfunction   . Fibromyalgia   . GERD (gastroesophageal reflux disease)   . Hyperlipemia   . Hypertension   . Hypothyroid   . IBS (irritable bowel syndrome)   . S/P cardiac cath 11/30/2007   normal coronaries - Dr. Burt Knack (Dr. Debara Pickett reviewed films on 01/10/2016)    Patient Active Problem List   Diagnosis Date Noted  . Chronic maxillary sinusitis 07/07/2018  . Encounter for Medicare annual wellness exam 06/04/2018  . Snoring 06/01/2018  . Costochondritis 06/01/2018  . Family history of heart disease 06/01/2018  . Class 2 severe obesity due to excess calories with serious comorbidity and body mass index (BMI) of 38.0 to 38.9 in adult ([place]) 06/01/2018  . Hematuria 04/28/2017  . Multiple atypical skin moles 04/28/2017  . Swollen lymph nodes 04/28/2017  . Screening for colon cancer 04/28/2017  . Healthcare maintenance 04/28/2017  . Shakiness  11/14/2016  . Otitis media of left ear 09/11/2016  . Depression, recurrent ([place]) 07/16/2016  . Adjustment disorder with mixed anxiety and depressed mood 01/15/2016  . Fe Def Anemia 12/22/2015  . Scoliosis (and kyphoscoliosis), idiopathic 12/20/2015  . Osteopenia 12/20/2015  . PCP NOTES >>> 01/22/2015  . Fibromyalgia 08/02/2014  . DOE (dyspnea on exertion)- chronic 07/07/2014  . GAD (generalized anxiety disorder) 09/17/2012  . UTI (lower urinary tract infection) 01/24/2012  . Vitamin D deficiency 01/24/2012  . Insomnia 01/24/2012  . Fatigue 10/14/2011  . Neck pain 07/23/2010  . Screening for breast cancer 07/23/2010  . B12 deficiency   . Hyperlipidemia 11/30/2007  . Coronary artery disease involving native heart without angina pectoris 11/30/2007  . PALPITATIONS, OCCASIONAL 04/15/2007  . Thyroid disease 01/27/2007  . Essential hypertension 01/27/2007  . Reactive airway disease that is not asthma 01/27/2007  . GERD 01/27/2007  . IBS 01/27/2007  . BACK PAIN, CHRONIC 01/27/2007    Past Surgical History:  Procedure Laterality Date  . CESAREAN SECTION    . CHOLECYSTECTOMY    . NECK SURGERY  1998     OB History   No obstetric history on file.      Home Medications    Prior to Admission medications   Medication Sig Start Date End Date Taking? Authorizing Provider  calcium-vitamin D (OSCAL WITH D) 250-125 MG-UNIT tablet Take 1 tablet by mouth daily.  [provider]  chlorhexidine gluconate, MEDLINE KIT, (PERIDEX) 0.12 % solution Use as directed 15 mLs in the mouth or throat 2 (two) times daily. 03/02/18   Isla Pence, MD  CVS VITAMIN B12 1000 MCG tablet TAKE 1 TABLET (1,000 MCG TOTAL) BY MOUTH DAILY. 03/24/18   Danford, Valetta Fuller D, NP  ketoconazole (NIZORAL) 2 % shampoo Apply 1 application topically 2 (two) times a week. 10/30/17   Mina Marble D, NP  levothyroxine (SYNTHROID) 175 MCG tablet Take 1 tablet (175 mcg total) by mouth daily before breakfast. 07/29/18    Danford, Valetta Fuller D, NP  MATZIM LA 360 MG 24 hr tablet TAKE 1 TABLET (360 MG TOTAL) BY MOUTH DAILY. MUST KEEP UPCOMING APPT FOR FURTHER REFILLS 07/06/18   Pixie Casino, MD  Potassium 75 MG TABS Take 1 tablet daily by mouth.     [provider]  Vitamin D, Ergocalciferol, (DRISDOL) 50000 units CAPS capsule TAKE ONE CAPSULE BY MOUTH ONE TIME PER WEEK 11/27/17   Danford, Berna Spare, NP    Family History Family History  Problem Relation Age of Onset  . Heart attack Father 15  . Lung cancer Other        uncle, non smoker  . Leukemia Other        uncle  . Cancer Mother        oral  . Stroke Mother   . Cancer Maternal Aunt        breast  . Alcohol abuse Maternal Aunt   . Alcohol abuse Maternal Uncle   . Cancer Maternal Uncle        lung, stomach, oral  . Cancer Paternal Uncle        lung  . Congestive Heart Failure Maternal Grandfather   . Heart attack Paternal Grandfather   . Cancer Paternal Uncle        bone  . Colon cancer Neg Hx   . Breast cancer Neg Hx   . Diabetes Neg Hx     Social History Social History   Tobacco Use  . Smoking status: Never Smoker  . Smokeless tobacco: Never Used  Substance Use Topics  . Alcohol use: No  . Drug use: No     Allergies   Ace inhibitors and Lisinopril   Review of Systems Review of Systems  Constitutional: Negative for fever.  HENT: Negative for rhinorrhea and sore throat.   Eyes: Negative for redness.  Respiratory: Negative for cough.   Cardiovascular: Negative for chest pain.  Gastrointestinal: Positive for abdominal pain. Negative for diarrhea, nausea and vomiting.  Genitourinary: Positive for dysuria. Negative for hematuria.  Musculoskeletal: Negative for myalgias.  Skin: Negative for rash.  Neurological: Negative for headaches.     Physical Exam Updated Vital Signs BP (!) 143/81 (BP Location: Left Arm)   Pulse 67   Temp 98.6 F (37 C) (Oral)   Resp 18   Ht 5' 4"  (1.626 m)   Wt 99.8 kg   SpO2 99%   BMI  37.76 kg/m   Physical Exam Vitals signs and nursing note reviewed.  Constitutional:      Appearance: She is well-developed.  HENT:     Head: Normocephalic and atraumatic.  Eyes:     Conjunctiva/sclera: Conjunctivae normal.  Neck:     Musculoskeletal: Normal range of motion and neck supple.  Pulmonary:     Effort: No respiratory distress.  Skin:    General: Skin is warm and dry.  Neurological:     Mental  Status: She is alert.      ED Treatments / Results  Labs (all labs ordered are listed, but only abnormal results are displayed) Labs Reviewed  URINE CULTURE  URINALYSIS, ROUTINE W REFLEX MICROSCOPIC    EKG None  Radiology No results found.  Procedures Procedures (including critical care time)  Medications Ordered in ED Medications  cefTRIAXone (ROCEPHIN) injection 500 mg (has no administration in time range)     Initial Impression / Assessment and Plan / ED Course  I have reviewed the triage vital signs and the nursing notes.  Pertinent labs & imaging results that were available during my care of the patient were reviewed by me and considered in my medical decision making (see chart for details).        Patient seen and examined.  Patient clinically with urinary tract infection.  Will treat.  She will follow-up with her doctor regarding recurrent symptoms.  Medications ordered.   Vital signs reviewed and are as follows: BP (!) 143/81 (BP Location: Left Arm)   Pulse 67   Temp 98.6 F (37 C) (Oral)   Resp 18   Ht 5' 4"  (1.626 m)   Wt 99.8 kg   SpO2 99%   BMI 37.76 kg/m   8:40 PM UA with signs of infection.  Patient has received IM Rocephin.  Discharged home on Keflex x10 days.  Culture sent and is pending.  Final Clinical Impressions(s) / ED Diagnoses   Final diagnoses:  Acute cystitis without hematuria   Patient with recurrent signs of acute uncomplicated cystitis.  This is her second instance in the past 2 months.  No signs of pyelonephritis.   No abdominal pain to suggest other etiology and patient had a CT scan last time which was negative for other problems.   ED Discharge Orders         Ordered    cephALEXin (KEFLEX) 500 MG capsule  2 times daily     08/01/18 2039           Carlisle Cater, Hershal Coria 08/01/18 2041    Sherwood Gambler, MD 08/01/18 2201

## 2018-08-01 NOTE — ED Triage Notes (Signed)
Pt reports dysuria and burning this am. Similar Sx in March and she was given antibiotic. She did a virtual visit this afternoon but was unable to be prescribed any medication bc it hasn't been over 60 days since she was last treated. She states in March she "got a shot" which helped her alot

## 2018-08-01 NOTE — ED Notes (Signed)
PT unable to give urine sample at this time.

## 2018-08-03 LAB — URINE CULTURE

## 2018-08-04 ENCOUNTER — Telehealth: Payer: Self-pay | Admitting: Cardiology

## 2018-08-04 NOTE — Telephone Encounter (Signed)
sw patient - she will call back to schedule virtual with Dr. Harrell Gave for 3 month fu.

## 2018-08-27 ENCOUNTER — Other Ambulatory Visit: Payer: Medicare Other

## 2018-09-01 ENCOUNTER — Other Ambulatory Visit: Payer: Medicare Other

## 2018-09-03 ENCOUNTER — Other Ambulatory Visit (INDEPENDENT_AMBULATORY_CARE_PROVIDER_SITE_OTHER): Payer: Medicare Other

## 2018-09-03 ENCOUNTER — Other Ambulatory Visit: Payer: Self-pay

## 2018-09-03 DIAGNOSIS — E079 Disorder of thyroid, unspecified: Secondary | ICD-10-CM

## 2018-09-04 ENCOUNTER — Other Ambulatory Visit: Payer: Self-pay | Admitting: Adult Health

## 2018-09-04 LAB — TSH: TSH: 0.042 u[IU]/mL — ABNORMAL LOW (ref 0.450–4.500)

## 2018-09-04 MED ORDER — LEVOTHYROXINE SODIUM 150 MCG PO TABS: 150.0000 ug | ORAL_TABLET | Freq: Every day | ORAL | 0 refills | Status: DC

## 2018-09-07 ENCOUNTER — Other Ambulatory Visit: Payer: Self-pay | Admitting: *Deleted

## 2018-09-07 ENCOUNTER — Telehealth: Payer: Self-pay | Admitting: *Deleted

## 2018-09-07 DIAGNOSIS — I10 Essential (primary) hypertension: Secondary | ICD-10-CM

## 2018-09-07 DIAGNOSIS — E079 Disorder of thyroid, unspecified: Secondary | ICD-10-CM

## 2018-09-07 DIAGNOSIS — Z131 Encounter for screening for diabetes mellitus: Secondary | ICD-10-CM

## 2018-09-07 DIAGNOSIS — E782 Mixed hyperlipidemia: Secondary | ICD-10-CM

## 2018-09-07 NOTE — Telephone Encounter (Signed)
Pt informed of below and sent to scheduling team to schedule visit. April Zimmerman Rumple, CMA

## 2018-09-07 NOTE — Telephone Encounter (Signed)
-----   Message from [name], NP sent at 09/07/2018 11:37 AM EDT ----- Regarding: TSH Level Notes recorded by [name], NP on 09/04/2018 at 11:26 AM EDT Good Morning April, Can you please call [name] and share- Her TSH remains too low, I reduced her Levothyroxine dose from 175 to 150mcg QAM with only water. I again encourage her to establish with an Endocrinologist to address her thyroid disease, please ask if she has make an OV. Please have her schedule fasting lab appt in 6 weeks (last fasting labs were Jan 2019). I sent in in new Rx , can you please put in labs. Thanks! Valetta Fuller

## 2018-09-07 NOTE — Telephone Encounter (Signed)
-----   Message from [name], NP sent at 09/07/2018 11:37 AM EDT ----- Regarding: TSH Level Notes recorded by [name], NP on 09/04/2018 at 11:26 AM EDT Good Morning April, Can you please call [name] and share- Her TSH remains too low, I reduced her Levothyroxine dose from 175 to 151mcg QAM with only water. I again encourage her to establish with an Endocrinologist to address her thyroid disease, please ask if she has make an OV. Please have her schedule fasting lab appt in 6 weeks (last fasting labs were Jan 2019). I sent in in new Rx , can you please put in labs. Thanks! Valetta Fuller

## 2018-09-08 ENCOUNTER — Ambulatory Visit: Payer: Medicare Other | Admitting: Adult Health

## 2018-09-08 ENCOUNTER — Other Ambulatory Visit: Payer: Self-pay

## 2018-09-08 MED ORDER — LEVOTHYROXINE SODIUM 150 MCG PO TABS: 150.0000 ug | ORAL_TABLET | Freq: Every day | ORAL | 0 refills | Status: DC

## 2018-09-08 NOTE — Progress Notes (Deleted)
Subjective:    Patient ID: Kayla [name], female    DOB: 1945-06-18, 73 y.o.   MRN: [id number]  HPI:  Kayla [name] presents   Of Note- 09/03/2018 Her TSH remains too low, I reduced her Levothyroxine dose from 175 to 180mg QAM with only water. I again encourage her to establish with an Endocrinologist to address her thyroid disease, please ask if she has make an OV. Please have her schedule fasting lab appt in 6 weeks (last fasting labs were Jan 2019).  Patient Care Team    Relationship Specialty Notifications Start End  DMina MarbleD, NP PCP - General Family Medicine  11/14/16   CBuford Dresser MD PCP - Cardiology Cardiology Admissions 03/26/18   HAllyn Kenner MD  Dermatology  12/20/15   HPixie Casino MD Consulting Physician Cardiology  01/01/16   ALahoma Rocker MD Referring Physician Rheumatology  01/01/16    Comment: fibromyalgia/  and osteopania and scolisois  [facility]    08/04/17     Patient Active Problem List   Diagnosis Date Noted  . Chronic maxillary sinusitis 07/07/2018  . Encounter for Medicare annual wellness exam 06/04/2018  . Snoring 06/01/2018  . Costochondritis 06/01/2018  . Family history of heart disease 06/01/2018  . Class 2 severe obesity due to excess calories with serious comorbidity and body mass index (BMI) of 38.0 to 38.9 in adult ([place] 06/01/2018  . Hematuria 04/28/2017  . Multiple atypical skin moles 04/28/2017  . Swollen lymph nodes 04/28/2017  . Screening for colon cancer 04/28/2017  . Healthcare maintenance 04/28/2017  . Shakiness 11/14/2016  . Otitis media of left ear 09/11/2016  . Depression, recurrent ([place] 07/16/2016  . Adjustment disorder with mixed anxiety and depressed mood 01/15/2016  . Fe Def Anemia 12/22/2015  . Scoliosis (and kyphoscoliosis), idiopathic 12/20/2015  . Osteopenia 12/20/2015  . PCP NOTES >>> 01/22/2015  . Fibromyalgia 08/02/2014  . DOE (dyspnea on exertion)- chronic 07/07/2014  . GAD  (generalized anxiety disorder) 09/17/2012  . UTI (lower urinary tract infection) 01/24/2012  . Vitamin D deficiency 01/24/2012  . Insomnia 01/24/2012  . Fatigue 10/14/2011  . Neck pain 07/23/2010  . Screening for breast cancer 07/23/2010  . B12 deficiency   . Hyperlipidemia 11/30/2007  . Coronary artery disease involving native heart without angina pectoris 11/30/2007  . PALPITATIONS, OCCASIONAL 04/15/2007  . Thyroid disease 01/27/2007  . Essential hypertension 01/27/2007  . Reactive airway disease that is not asthma 01/27/2007  . GERD 01/27/2007  . IBS 01/27/2007  . BACK PAIN, CHRONIC 01/27/2007     Past Medical History:  Diagnosis Date  . Anemia   . Asthma   . B12 deficiency   . Chronic LBP   . Diastolic dysfunction   . Fibromyalgia   . GERD (gastroesophageal reflux disease)   . Hyperlipemia   . Hypertension   . Hypothyroid   . IBS (irritable bowel syndrome)   . S/P cardiac cath 11/30/2007   normal coronaries - Dr. CBurt Knack(Dr. HDebara Pickettreviewed films on 01/10/2016)     Past Surgical History:  Procedure Laterality Date  . CESAREAN SECTION    . CHOLECYSTECTOMY    . NECK SURGERY  1998     Family History  Problem Relation Age of Onset  . Heart attack Father 720 . Lung cancer Other        uncle, non smoker  . Leukemia Other        uncle  . Cancer Mother  oral  . Stroke Mother   . Cancer Maternal Aunt        breast  . Alcohol abuse Maternal Aunt   . Alcohol abuse Maternal Uncle   . Cancer Maternal Uncle        lung, stomach, oral  . Cancer Paternal Uncle        lung  . Congestive Heart Failure Maternal Grandfather   . Heart attack Paternal Grandfather   . Cancer Paternal Uncle        bone  . Colon cancer Neg Hx   . Breast cancer Neg Hx   . Diabetes Neg Hx      Social History   Substance and Sexual Activity  Drug Use No     Social History   Substance and Sexual Activity  Alcohol Use No     Social History   Tobacco Use  Smoking  Status Never Smoker  Smokeless Tobacco Never Used     Outpatient Encounter Medications as of 09/08/2018  Medication Sig  . calcium-vitamin D (OSCAL WITH D) 250-125 MG-UNIT tablet Take 1 tablet by mouth daily.  . chlorhexidine gluconate, MEDLINE KIT, (PERIDEX) 0.12 % solution Use as directed 15 mLs in the mouth or throat 2 (two) times daily.  . CVS VITAMIN B12 1000 MCG tablet TAKE 1 TABLET (1,000 MCG TOTAL) BY MOUTH DAILY.  Marland Kitchen ketoconazole (NIZORAL) 2 % shampoo Apply 1 application topically 2 (two) times a week.  . levothyroxine (SYNTHROID) 150 MCG tablet Take 1 tablet (150 mcg total) by mouth daily before breakfast.  . MATZIM LA 360 MG 24 hr tablet TAKE 1 TABLET (360 MG TOTAL) BY MOUTH DAILY. MUST KEEP UPCOMING APPT FOR FURTHER REFILLS  . Potassium 75 MG TABS Take 1 tablet daily by mouth.   . Vitamin D, Ergocalciferol, (DRISDOL) 50000 units CAPS capsule TAKE ONE CAPSULE BY MOUTH ONE TIME PER WEEK  . [DISCONTINUED] levothyroxine (SYNTHROID) 150 MCG tablet Take 1 tablet (150 mcg total) by mouth daily before breakfast.   No facility-administered encounter medications on file as of 09/08/2018.     Allergies: Ace inhibitors and Lisinopril  There is no height or weight on file to calculate BMI.  There were no vitals taken for this visit.     Review of Systems     Objective:   Physical Exam        Assessment & Plan:  No diagnosis found.  No problem-specific Assessment & Plan notes found for this encounter.    FOLLOW-UP:  No follow-ups on file.

## 2018-09-16 ENCOUNTER — Telehealth: Payer: Self-pay | Admitting: Emergency Medicine

## 2018-09-16 NOTE — Telephone Encounter (Signed)
Forwarding to Dr Lamonte Sakai  Please advise ASAP thanks

## 2018-09-16 NOTE — Telephone Encounter (Signed)
Her appointment is at 3 pm tomorrow. Please ask Dr. Lamonte Sakai early tomorrow as he is seeing her tomorrow. Thanks

## 2018-09-16 NOTE — Telephone Encounter (Signed)
Spoke with pt, she states she is weak and thinks its from her thyroid medication. She recently changed medications and has been experience symptoms with tachycardia and feeling like her body was vibrating. The pharmacist confirmed the sig with her and they figured out that she was supposed to take the medication once a week and she was taking it once a day. She had another incident where she got really weak but thinks it is from her thyroid. She denies fever, SOB but its due to her weight, denies cough. Can she still come to her appt tomorrow?   She is also having some insomnia and would like [place] to call her and leave a detailed message letting her know if she can come to her appt because she usually sleeps through the morning.

## 2018-09-17 ENCOUNTER — Institutional Professional Consult (permissible substitution): Payer: Medicare Other | Admitting: Emergency Medicine

## 2018-09-17 ENCOUNTER — Telehealth: Payer: Self-pay | Admitting: Emergency Medicine

## 2018-09-17 NOTE — Telephone Encounter (Signed)
Pt calling back to see if it is still okay for her to come in for her scheduled consult with Dr. Lamonte Sakai today at 3pm or if the appt needs to be rescheduled. Please refer to encounter from yesterday, 6/10 as this is a duplicate encounter to that one.

## 2018-09-17 NOTE — Telephone Encounter (Signed)
Called and spoke with pt stating to her the information per RB. Per pt, since she had not been able to hear from anyone prior to now, pt went ahead and rescheduled her appt. Nothing further needed.

## 2018-09-17 NOTE — Telephone Encounter (Signed)
There is no reason that we should preclude her from coming, but if she doesn't feel up to it then it is OK with me to reschedule. This was for a new consult, so depending on the reason for the consult, she may already have an answer to her problem (if she was taking the thyroid med incorrectly)

## 2018-09-28 ENCOUNTER — Ambulatory Visit (INDEPENDENT_AMBULATORY_CARE_PROVIDER_SITE_OTHER): Payer: Medicare Other

## 2018-09-28 ENCOUNTER — Encounter: Payer: Self-pay | Admitting: Emergency Medicine

## 2018-09-28 ENCOUNTER — Ambulatory Visit: Payer: Medicare Other | Admitting: Emergency Medicine

## 2018-09-28 ENCOUNTER — Other Ambulatory Visit: Payer: Self-pay

## 2018-09-28 VITALS — BP 134/80 | HR 97 | Temp 98.0°F | Ht 63.25 in | Wt 222.0 lb

## 2018-09-28 DIAGNOSIS — R0683 Snoring: Secondary | ICD-10-CM | POA: Diagnosis not present

## 2018-09-28 DIAGNOSIS — R0600 Dyspnea, unspecified: Secondary | ICD-10-CM

## 2018-09-28 NOTE — Patient Instructions (Signed)
We will perform full pulmonary function testing Chest x-ray today Agree that you need to have a split-night sleep study.  We will try to help schedule this. Follow with Dr Lamonte Sakai next available after your PFT and sleep study have been performed so that we can review the results together.

## 2018-09-28 NOTE — Progress Notes (Signed)
Subjective:    Patient ID: Kayla [name], female    DOB: Jul 27, 1945, 73 y.o.   MRN: [id number]  HPI Kayla [name] is 73, a never smoker with a history of fibromyalgia, GERD, hypertension with diastolic dysfunction, anemia, hypothyroidism, irritable bowel syndrome, costochondritis. She has been told that she might have asthma - unclear, over 15 yrs ago.   She is a self-referral today to evaluate exertional shortness of breath. She also notes that she believes that she has OSA - has been told that she snores and has abnormal breathing when sleeping. She describes exertional SOB  For a few years, has progressively worsened. She believes that she first noticed it when she gained wt. Happens with exertion, but also with sitting. Has to yawn to get an adequate breath. She describes some dysphagia, some difficulty swallowing pills. She is having a lot of sinus drainage, throat clearing but no cough. She has GERD sx as well - depends on what she eats. She can walk about 100 ft. She is able to shop with a cart.   CT-PA 05/22/2018 >> reviewed by me.  Shows a 4.2 cm thoracic aortic aneurysm.  No infiltrate.  Small hiatal hernia   Review of Systems  HENT: Positive for trouble swallowing.   Respiratory: Positive for chest tightness and shortness of breath.   Cardiovascular: Positive for chest pain.  Gastrointestinal:       Acid Reflux  Musculoskeletal: Positive for arthralgias and joint swelling.  Skin: Positive for rash.  Neurological: Positive for headaches.  Psychiatric/Behavioral: The patient is nervous/anxious.     Past Medical History:  Diagnosis Date  . Anemia   . Asthma   . B12 deficiency   . Chronic LBP   . Diastolic dysfunction   . Fibromyalgia   . GERD (gastroesophageal reflux disease)   . Hyperlipemia   . Hypertension   . Hypothyroid   . IBS (irritable bowel syndrome)   . S/P cardiac cath 11/30/2007   normal coronaries - Dr. Burt Knack (Dr. Debara Pickett reviewed films on 01/10/2016)     Family  History  Problem Relation Age of Onset  . Heart attack Father 45  . Lung cancer Other        uncle, non smoker  . Leukemia Other        uncle  . Cancer Mother        oral  . Stroke Mother   . Cancer Maternal Aunt        breast  . Alcohol abuse Maternal Aunt   . Alcohol abuse Maternal Uncle   . Cancer Maternal Uncle        lung, stomach, oral  . Cancer Paternal Uncle        lung  . Congestive Heart Failure Maternal Grandfather   . Heart attack Paternal Grandfather   . Cancer Paternal Uncle        bone  . Colon cancer Neg Hx   . Breast cancer Neg Hx   . Diabetes Neg Hx      Social History   Socioeconomic History  . Marital status: Widowed    Spouse name: Not on file  . Number of children: 2  . Years of education: Not on file  . Highest education level: Not on file  Occupational History  . Not on file  Social Needs  . Financial resource strain: Not on file  . Food insecurity    Worry: Not on file    Inability: Not on file  .  Transportation needs    Medical: Not on file    Non-medical: Not on file  Tobacco Use  . Smoking status: Never Smoker  . Smokeless tobacco: Never Used  Substance and Sexual Activity  . Alcohol use: No  . Drug use: No  . Sexual activity: Not on file  Lifestyle  . Physical activity    Days per week: Not on file    Minutes per session: Not on file  . Stress: Not on file  Relationships  . Social Herbalist on phone: Not on file    Gets together: Not on file    Attends religious service: Not on file    Active member of club or organization: Not on file    Attends meetings of clubs or organizations: Not on file    Relationship status: Not on file  . Intimate partner violence    Fear of current or ex partner: Not on file    Emotionally abused: Not on file    Physically abused: Not on file    Forced sexual activity: Not on file  Other Topics Concern  . Not on file  Social History Narrative   Lost husband 2009, lost mother  2016  Retired Education officer, museum Originally from [place]  . Ace Inhibitors Anaphylaxis    REACTION: glossal edema  . Lisinopril Anaphylaxis     Outpatient Medications Prior to Visit  Medication Sig Dispense Refill  . calcium-vitamin D (OSCAL WITH D) 250-125 MG-UNIT tablet Take 1 tablet by mouth daily.    . chlorhexidine gluconate, MEDLINE KIT, (PERIDEX) 0.12 % solution Use as directed 15 mLs in the mouth or throat 2 (two) times daily. 120 mL 0  . CVS VITAMIN B12 1000 MCG tablet TAKE 1 TABLET (1,000 MCG TOTAL) BY MOUTH DAILY. 90 tablet 1  . ketoconazole (NIZORAL) 2 % shampoo Apply 1 application topically 2 (two) times a week. 120 mL 0  . levothyroxine (SYNTHROID) 150 MCG tablet Take 1 tablet (150 mcg total) by mouth daily before breakfast. 45 tablet 0  . MATZIM LA 360 MG 24 hr tablet TAKE 1 TABLET (360 MG TOTAL) BY MOUTH DAILY. MUST KEEP UPCOMING APPT FOR FURTHER REFILLS 30 tablet 6  . Potassium 75 MG TABS Take 1 tablet daily by mouth.     . Vitamin D, Ergocalciferol, (DRISDOL) 50000 units CAPS capsule TAKE ONE CAPSULE BY MOUTH ONE TIME PER WEEK 12 capsule 10   No facility-administered medications prior to visit.         Objective:   Physical Exam Vitals:   09/28/18 1514  BP: 134/80  Pulse: 97  Temp: 98 F (36.7 C)  TempSrc: Oral  SpO2: 96%  Weight: 222 lb (100.7 kg)  Height: 5' 3.25" (1.607 m)   Gen: Pleasant, obese woman, in no distress, a bit nervous, tangential  ENT: No lesions,  mouth clear,  oropharynx clear, no postnasal drip  Neck: No JVD, no stridor  Lungs: No use of accessory muscles, small breaths, no crackles or wheezing  Cardiovascular: RRR, heart sounds normal, no murmur or gallops, trace peripheral edema  Musculoskeletal: No deformities, no cyanosis or clubbing  Neuro: alert, awake, non focal  Skin: Warm, no lesions or rash      Assessment & Plan:  Dyspnea Can happen both at rest and with exertion.  Suspect based on  her description that there is a large component of restrictive lung disease and deconditioning.  She has noted more  dyspnea with weight gain, can happen with bending.  Also note that she has episodic costochondritic pain which almost certainly limits her inspiratory expansion.  At this point I think she needs pulmonary function testing and assessment for possible obstructive lung disease.  If present then we could consider bronchodilator therapy.  Depending on results we could consider referral to cardiology or cardiopulmonary exercise testing.  Snoring She gives a history that is consistent with obstructive sleep apnea.  I think she needs a split-night sleep study.  We will work to confirm that this has been scheduled  Baltazar Apo, MD, PhD 09/28/2018, 5:32 PM [facility] [phone] or if no answer [phone]

## 2018-09-28 NOTE — Assessment & Plan Note (Signed)
Can happen both at rest and with exertion.  Suspect based on her description that there is a large component of restrictive lung disease and deconditioning.  She has noted more dyspnea with weight gain, can happen with bending.  Also note that she has episodic costochondritic pain which almost certainly limits her inspiratory expansion.  At this point I think she needs pulmonary function testing and assessment for possible obstructive lung disease.  If present then we could consider bronchodilator therapy.  Depending on results we could consider referral to cardiology or cardiopulmonary exercise testing.

## 2018-09-28 NOTE — Assessment & Plan Note (Signed)
She gives a history that is consistent with obstructive sleep apnea.  I think she needs a split-night sleep study.  We will work to confirm that this has been scheduled

## 2018-09-30 LAB — BASIC METABOLIC PANEL
BUN: 13 (ref 4–21)
Creatinine: 0.9 (ref 0.5–1.1)
Glucose: 119
Potassium: 4.1 (ref 3.4–5.3)
Sodium: 4 — AB (ref 137–147)

## 2018-09-30 LAB — SEDIMENTATION RATE: ESR: 53

## 2018-09-30 LAB — HEPATIC FUNCTION PANEL
ALT: 15 (ref 7–35)
AST: 13 (ref 13–35)
Alkaline Phosphatase: 98 (ref 25–125)
Bilirubin, Total: 0.3

## 2018-09-30 LAB — CBC AND DIFFERENTIAL
HCT: 44 (ref 36–46)
Hemoglobin: 14.3 (ref 12.0–16.0)
Platelets: 340 (ref 150–399)
WBC: 10.9

## 2018-10-02 NOTE — Telephone Encounter (Signed)
Opened in error

## 2018-10-11 ENCOUNTER — Inpatient Hospital Stay (HOSPITAL_BASED_OUTPATIENT_CLINIC_OR_DEPARTMENT_OTHER)
Admission: EM | Admit: 2018-10-11 | Discharge: 2018-10-15 | DRG: 872 | Disposition: A | Discharge status: Home / Self Care | Payer: Medicare Other | Attending: Internal Medicine | Admitting: Internal Medicine

## 2018-10-11 ENCOUNTER — Encounter (HOSPITAL_BASED_OUTPATIENT_CLINIC_OR_DEPARTMENT_OTHER): Payer: Self-pay | Admitting: *Deleted

## 2018-10-11 ENCOUNTER — Other Ambulatory Visit: Payer: Self-pay

## 2018-10-11 ENCOUNTER — Emergency Department (HOSPITAL_BASED_OUTPATIENT_CLINIC_OR_DEPARTMENT_OTHER): Payer: Medicare Other

## 2018-10-11 DIAGNOSIS — M797 Fibromyalgia: Secondary | ICD-10-CM | POA: Diagnosis present

## 2018-10-11 DIAGNOSIS — E039 Hypothyroidism, unspecified: Secondary | ICD-10-CM | POA: Diagnosis present

## 2018-10-11 DIAGNOSIS — A4151 Sepsis due to Escherichia coli [E. coli]: Principal | ICD-10-CM | POA: Diagnosis present

## 2018-10-11 DIAGNOSIS — A419 Sepsis, unspecified organism: Secondary | ICD-10-CM

## 2018-10-11 DIAGNOSIS — Z888 Allergy status to other drugs, medicaments and biological substances status: Secondary | ICD-10-CM

## 2018-10-11 DIAGNOSIS — N10 Acute tubulo-interstitial nephritis: Secondary | ICD-10-CM | POA: Diagnosis present

## 2018-10-11 DIAGNOSIS — N12 Tubulo-interstitial nephritis, not specified as acute or chronic: Secondary | ICD-10-CM | POA: Diagnosis not present

## 2018-10-11 DIAGNOSIS — M545 Low back pain: Secondary | ICD-10-CM | POA: Diagnosis present

## 2018-10-11 DIAGNOSIS — Z806 Family history of leukemia: Secondary | ICD-10-CM

## 2018-10-11 DIAGNOSIS — E876 Hypokalemia: Secondary | ICD-10-CM | POA: Diagnosis present

## 2018-10-11 DIAGNOSIS — Z823 Family history of stroke: Secondary | ICD-10-CM

## 2018-10-11 DIAGNOSIS — E538 Deficiency of other specified B group vitamins: Secondary | ICD-10-CM | POA: Diagnosis present

## 2018-10-11 DIAGNOSIS — Z20828 Contact with and (suspected) exposure to other viral communicable diseases: Secondary | ICD-10-CM | POA: Diagnosis present

## 2018-10-11 DIAGNOSIS — Z8249 Family history of ischemic heart disease and other diseases of the circulatory system: Secondary | ICD-10-CM

## 2018-10-11 DIAGNOSIS — Z801 Family history of malignant neoplasm of trachea, bronchus and lung: Secondary | ICD-10-CM

## 2018-10-11 DIAGNOSIS — I712 Thoracic aortic aneurysm, without rupture: Secondary | ICD-10-CM | POA: Diagnosis present

## 2018-10-11 DIAGNOSIS — Z79899 Other long term (current) drug therapy: Secondary | ICD-10-CM

## 2018-10-11 DIAGNOSIS — K219 Gastro-esophageal reflux disease without esophagitis: Secondary | ICD-10-CM | POA: Diagnosis present

## 2018-10-11 DIAGNOSIS — N83202 Unspecified ovarian cyst, left side: Secondary | ICD-10-CM | POA: Diagnosis present

## 2018-10-11 DIAGNOSIS — Z1611 Resistance to penicillins: Secondary | ICD-10-CM | POA: Diagnosis present

## 2018-10-11 DIAGNOSIS — E785 Hyperlipidemia, unspecified: Secondary | ICD-10-CM | POA: Diagnosis present

## 2018-10-11 DIAGNOSIS — I10 Essential (primary) hypertension: Secondary | ICD-10-CM | POA: Diagnosis present

## 2018-10-11 DIAGNOSIS — H9190 Unspecified hearing loss, unspecified ear: Secondary | ICD-10-CM | POA: Diagnosis present

## 2018-10-11 DIAGNOSIS — G8929 Other chronic pain: Secondary | ICD-10-CM | POA: Diagnosis present

## 2018-10-11 DIAGNOSIS — Z9049 Acquired absence of other specified parts of digestive tract: Secondary | ICD-10-CM

## 2018-10-11 DIAGNOSIS — Z79890 Hormone replacement therapy (postmenopausal): Secondary | ICD-10-CM

## 2018-10-11 DIAGNOSIS — A411 Sepsis due to other specified staphylococcus: Secondary | ICD-10-CM | POA: Diagnosis present

## 2018-10-11 LAB — URINALYSIS, MICROSCOPIC (REFLEX): WBC, UA: >50 WBC/hpf (ref 0–5)

## 2018-10-11 LAB — CBC WITH DIFFERENTIAL/PLATELET
Abs Immature Granulocytes: 0.1 10*3/uL — ABNORMAL HIGH (ref 0.00–0.07)
Basophils Absolute: 0.1 10*3/uL (ref 0.0–0.1)
Basophils Relative: 0 %
Eosinophils Absolute: 0 10*3/uL (ref 0.0–0.5)
Eosinophils Relative: 0 %
HCT: 45.6 % (ref 36.0–46.0)
Hemoglobin: 14.5 g/dL (ref 12.0–15.0)
Immature Granulocytes: 1 %
Lymphocytes Relative: 10 %
Lymphs Abs: 2.1 10*3/uL (ref 0.7–4.0)
MCH: 30.7 pg (ref 26.0–34.0)
MCHC: 31.8 g/dL (ref 30.0–36.0)
MCV: 96.6 fL (ref 80.0–100.0)
Monocytes Absolute: 2.3 10*3/uL — ABNORMAL HIGH (ref 0.1–1.0)
Monocytes Relative: 11 %
Neutro Abs: 16.1 10*3/uL — ABNORMAL HIGH (ref 1.7–7.7)
Neutrophils Relative %: 78 %
Platelets: 319 10*3/uL (ref 150–400)
RBC: 4.72 MIL/uL (ref 3.87–5.11)
RDW: 12.5 % (ref 11.5–15.5)
WBC: 20.6 10*3/uL — ABNORMAL HIGH (ref 4.0–10.5)
nRBC: 0 % (ref 0.0–0.2)

## 2018-10-11 LAB — COMPREHENSIVE METABOLIC PANEL
ALT: 13 U/L (ref 0–44)
AST: 14 U/L — ABNORMAL LOW (ref 15–41)
Albumin: 3.8 g/dL (ref 3.5–5.0)
Alkaline Phosphatase: 82 U/L (ref 38–126)
Anion gap: 12 (ref 5–15)
BUN: 15 mg/dL (ref 8–23)
CO2: 20 mmol/L — ABNORMAL LOW (ref 22–32)
Calcium: 9.2 mg/dL (ref 8.9–10.3)
Chloride: 107 mmol/L (ref 98–111)
Creatinine, Ser: 1.01 mg/dL — ABNORMAL HIGH (ref 0.44–1.00)
GFR calc Af Amer: >60 mL/min (ref >60)
GFR calc non Af Amer: 55 mL/min — ABNORMAL LOW (ref >60)
Glucose, Bld: 142 mg/dL — ABNORMAL HIGH (ref 70–99)
Potassium: 3.4 mmol/L — ABNORMAL LOW (ref 3.5–5.1)
Sodium: 139 mmol/L (ref 135–145)
Total Bilirubin: 0.9 mg/dL (ref 0.3–1.2)
Total Protein: 7.7 g/dL (ref 6.5–8.1)

## 2018-10-11 LAB — URINALYSIS, ROUTINE W REFLEX MICROSCOPIC
Glucose, UA: NEGATIVE mg/dL
Ketones, ur: NEGATIVE mg/dL
Nitrite: NEGATIVE
Protein, ur: 100 mg/dL — AB
Specific Gravity, Urine: >1.03 — ABNORMAL HIGH (ref 1.005–1.030)
pH: 5.5 (ref 5.0–8.0)

## 2018-10-11 LAB — LACTIC ACID, PLASMA: Lactic Acid, Venous: 0.8 mmol/L (ref 0.5–1.9)

## 2018-10-11 LAB — SARS CORONAVIRUS 2 AG (30 MIN TAT): SARS Coronavirus 2 Ag: NEGATIVE

## 2018-10-11 LAB — LIPASE, BLOOD: Lipase: 33 U/L (ref 11–51)

## 2018-10-11 MED ORDER — SODIUM CHLORIDE 0.9 % IV SOLN
1.0000 g | Freq: Once | INTRAVENOUS | Status: AC
  Administered 2018-10-11: 23:00:00 1 g via INTRAVENOUS
  Filled 2018-10-11: qty 10

## 2018-10-11 MED ORDER — MORPHINE SULFATE (PF) 4 MG/ML IV SOLN
4.0000 mg | Freq: Once | INTRAVENOUS | Status: AC
  Administered 2018-10-11: 22:00:00 4 mg via INTRAVENOUS
  Filled 2018-10-11: qty 1

## 2018-10-11 MED ORDER — IOHEXOL 300 MG/ML  SOLN
100.0000 mL | Freq: Once | INTRAMUSCULAR | Status: AC | PRN
  Administered 2018-10-11: 23:00:00 100 mL via INTRAVENOUS

## 2018-10-11 MED ORDER — SODIUM CHLORIDE 0.9 % IV BOLUS
500.0000 mL | Freq: Once | INTRAVENOUS | Status: AC
  Administered 2018-10-11: 500 mL via INTRAVENOUS

## 2018-10-11 NOTE — ED Notes (Signed)
Patient transported to CT 

## 2018-10-11 NOTE — ED Provider Notes (Signed)
Signed out pending CT scan.  11:38 PM CT scan concerning for evidence of bilateral pyelonephritis.  Patient initially febrile, tachycardic, leukocytosis.  On recheck, she is non-ill-appearing.  Urinalysis does appear dirty and urine culture is pending.  Patient is receiving IV Rocephin.  She had a lactate that was normal.  She does not appear septic.  However, given the bilateral enhancement of her kidneys and evidence of systemic illness, feel she would benefit from IV antibiotics.  Patient is agreeable to admission.  Results for orders placed or performed during the hospital encounter of 10/11/18  Urinalysis, Routine w reflex microscopic- may I&O cath if menses  Result Value Ref Range   Color, Urine YELLOW YELLOW   APPearance CLOUDY (A) CLEAR   Specific Gravity, Urine >1.030 (H) 1.005 - 1.030   pH 5.5 5.0 - 8.0   Glucose, UA NEGATIVE NEGATIVE mg/dL   Hgb urine dipstick MODERATE (A) NEGATIVE   Bilirubin Urine SMALL (A) NEGATIVE   Ketones, ur NEGATIVE NEGATIVE mg/dL   Protein, ur 100 (A) NEGATIVE mg/dL   Nitrite NEGATIVE NEGATIVE   Leukocytes,Ua SMALL (A) NEGATIVE  CBC with Differential/Platelet  Result Value Ref Range   WBC 20.6 (H) 4.0 - 10.5 K/uL   RBC 4.72 3.87 - 5.11 MIL/uL   Hemoglobin 14.5 12.0 - 15.0 g/dL   HCT 45.6 36.0 - 46.0 %   MCV 96.6 80.0 - 100.0 fL   MCH 30.7 26.0 - 34.0 pg   MCHC 31.8 30.0 - 36.0 g/dL   RDW 12.5 11.5 - 15.5 %   Platelets 319 150 - 400 K/uL   nRBC 0.0 0.0 - 0.2 %   Neutrophils Relative % 78 %   Neutro Abs 16.1 (H) 1.7 - 7.7 K/uL   Lymphocytes Relative 10 %   Lymphs Abs 2.1 0.7 - 4.0 K/uL   Monocytes Relative 11 %   Monocytes Absolute 2.3 (H) 0.1 - 1.0 K/uL   Eosinophils Relative 0 %   Eosinophils Absolute 0.0 0.0 - 0.5 K/uL   Basophils Relative 0 %   Basophils Absolute 0.1 0.0 - 0.1 K/uL   Immature Granulocytes 1 %   Abs Immature Granulocytes 0.10 (H) 0.00 - 0.07 K/uL  Comprehensive metabolic panel  Result Value Ref Range   Sodium 139 135  - 145 mmol/L   Potassium 3.4 (L) 3.5 - 5.1 mmol/L   Chloride 107 98 - 111 mmol/L   CO2 20 (L) 22 - 32 mmol/L   Glucose, Bld 142 (H) 70 - 99 mg/dL   BUN 15 8 - 23 mg/dL   Creatinine, Ser 1.01 (H) 0.44 - 1.00 mg/dL   Calcium 9.2 8.9 - 10.3 mg/dL   Total Protein 7.7 6.5 - 8.1 g/dL   Albumin 3.8 3.5 - 5.0 g/dL   AST 14 (L) 15 - 41 U/L   ALT 13 0 - 44 U/L   Alkaline Phosphatase 82 38 - 126 U/L   Total Bilirubin 0.9 0.3 - 1.2 mg/dL   GFR calc non Af Amer 55 (L) >60 mL/min   GFR calc Af Amer >60 >60 mL/min   Anion gap 12 5 - 15  Lactic acid, plasma  Result Value Ref Range   Lactic Acid, Venous 0.8 0.5 - 1.9 mmol/L  Lipase, blood  Result Value Ref Range   Lipase 33 11 - 51 U/L  Urinalysis, Microscopic (reflex)  Result Value Ref Range   RBC / HPF 11-20 0 - 5 RBC/hpf   WBC, UA >50 0 - 5 WBC/hpf  Bacteria, UA MANY (A) NONE SEEN   Squamous Epithelial / LPF 11-20 0 - 5   Dg Chest 2 View  Result Date: 09/28/2018 CLINICAL DATA:  History of asthma.  Dyspnea. EXAM: CHEST - 2 VIEW COMPARISON:  05/22/2018 FINDINGS: Cardiomediastinal silhouette is normal. Mediastinal contours appear intact. There is no evidence of focal airspace consolidation, pleural effusion or pneumothorax. Osseous structures are without acute abnormality. Soft tissues are grossly normal. IMPRESSION: No active cardiopulmonary disease. Electronically Signed   By: Fidela Salisbury M.D.   On: 09/28/2018 16:13   Ct Abdomen Pelvis W Contrast  Result Date: 10/11/2018 CLINICAL DATA:  Patient with right flank pain and worsening fatigue. EXAM: CT ABDOMEN AND PELVIS WITH CONTRAST TECHNIQUE: Multidetector CT imaging of the abdomen and pelvis was performed using the standard protocol following bolus administration of intravenous contrast. CONTRAST:  175mL OMNIPAQUE IOHEXOL 300 MG/ML  SOLN COMPARISON:  CT abdomen pelvis 06/26/2018 FINDINGS: Lower chest: Normal heart size. Minimal atelectasis right lower lobe. No pleural effusion.  Hepatobiliary: Liver is normal in size and contour. No focal hepatic lesions identified. Prior cholecystectomy. No intrahepatic or extrahepatic biliary ductal dilatation. Pancreas: Unremarkable Spleen: Unremarkable Adrenals/Urinary Tract: Normal adrenal glands. There is patchy hypoenhancement within the mid and superior pole of the left kidney (image 27; series 2). There is left perinephric fat stranding. Mild patchy hypoenhancement within the midpole of the right kidney. No hydronephrosis. Urinary bladder is unremarkable. Stomach/Bowel: Sigmoid colonic diverticulosis. No CT evidence for acute diverticulitis. Small hiatal hernia. Normal morphology of the stomach. No evidence for small bowel obstruction. No free fluid or free intraperitoneal air. Vascular/Lymphatic: Normal caliber abdominal aorta. Peripheral calcified atherosclerotic plaque. No retroperitoneal lymphadenopathy. Reproductive: Uterus is unremarkable. Similar-appearing 4.5 cm left ovarian cyst (image 59; series 2). Other: None. Musculoskeletal: Lumbar spine degenerative changes. No aggressive or acute appearing osseous lesions. IMPRESSION: 1. There is patchy hypoenhancement involving the left kidney with perinephric fat stranding raising the possibility of pyelonephritis. 2. Additionally there is suggestion of mild hypoenhancement within the midpole of the right kidney which may also represent pyelonephritis. 3. There is a 4.5 cm left ovarian cyst. This needs further evaluation with pelvic ultrasound in the non acute setting. Electronically Signed   By: Lovey Newcomer M.D.   On: 10/11/2018 23:03      Merryl Hacker, MD 10/11/18 [id number]

## 2018-10-11 NOTE — ED Notes (Signed)
Pt asked if she could eat and drink. RN checked with EDP and was cleared to eat and drink. Pt had personal drink and RN got her graham crackers with peanut butter

## 2018-10-11 NOTE — ED Provider Notes (Signed)
[facility] EMERGENCY DEPARTMENT Provider Note   CSN: [id number] Arrival date & time: 10/11/18  2025    History   Chief Complaint Chief Complaint  Patient presents with   Fatigue    ?UTI    HPI Kayla [name] is a 73 y.o. female.     HPI Patient with recurrent urinary tract infections presents with lower abdominal pain which radiates to her flanks bilaterally.  She has dysuria.  No hematuria.  Low-grade fever in the emergency department.  No real vomiting or diarrhea.  Patient states she did a home urinary test which was dark in color. Past Medical History:  Diagnosis Date   Anemia    Asthma    B12 deficiency    Chronic LBP    Diastolic dysfunction    Fibromyalgia    GERD (gastroesophageal reflux disease)    Hyperlipemia    Hypertension    Hypothyroid    IBS (irritable bowel syndrome)    S/P cardiac cath 11/30/2007   normal coronaries - Dr. Burt Knack (Dr. Debara Pickett reviewed films on 01/10/2016)    Patient Active Problem List   Diagnosis Date Noted   Pyelonephritis 10/12/2018   Acute pyelonephritis 10/12/2018   Chronic maxillary sinusitis 07/07/2018   Encounter for Medicare annual wellness exam 06/04/2018   Snoring 06/01/2018   Costochondritis 06/01/2018   Family history of heart disease 06/01/2018   Class 2 severe obesity due to excess calories with serious comorbidity and body mass index (BMI) of 38.0 to 38.9 in adult [facility]) 06/01/2018   Hematuria 04/28/2017   Multiple atypical skin moles 04/28/2017   Swollen lymph nodes 04/28/2017   Screening for colon cancer 04/28/2017   Healthcare maintenance 04/28/2017   Shakiness 11/14/2016   Otitis media of left ear 09/11/2016   Depression, recurrent ([place]) 07/16/2016   Adjustment disorder with mixed anxiety and depressed mood 01/15/2016   Fe Def Anemia 12/22/2015   Scoliosis (and kyphoscoliosis), idiopathic 12/20/2015   Osteopenia 12/20/2015   PCP NOTES >>> 01/22/2015   Fibromyalgia  08/02/2014   Dyspnea 07/07/2014   GAD (generalized anxiety disorder) 09/17/2012   UTI (lower urinary tract infection) 01/24/2012   Vitamin D deficiency 01/24/2012   Insomnia 01/24/2012   Fatigue 10/14/2011   Neck pain 07/23/2010   Screening for breast cancer 07/23/2010   B12 deficiency    Hyperlipidemia 11/30/2007   Coronary artery disease involving native heart without angina pectoris 11/30/2007   PALPITATIONS, OCCASIONAL 04/15/2007   Thyroid disease 01/27/2007   Essential hypertension 01/27/2007   Reactive airway disease that is not asthma 01/27/2007   GERD 01/27/2007   IBS 01/27/2007   BACK PAIN, CHRONIC 01/27/2007    Past Surgical History:  Procedure Laterality Date   [place]     OB History   No obstetric history on file.      Home Medications    Prior to Admission medications   Medication Sig Start Date End Date Taking? Authorizing Provider  calcium-vitamin D (OSCAL WITH D) 250-125 MG-UNIT tablet Take 1 tablet by mouth daily.   Yes [provider]  [facility] VITAMIN B12 1000 MCG tablet TAKE 1 TABLET (1,000 MCG TOTAL) BY MOUTH DAILY. 03/24/18  Yes Danford, Valetta Fuller D, NP  ketoconazole (NIZORAL) 2 % shampoo Apply 1 application topically 2 (two) times a week. 10/30/17  Yes Danford, Valetta Fuller D, NP  levothyroxine (SYNTHROID) 150 MCG tablet Take 1 tablet (150 mcg total) by mouth daily  before breakfast. 09/08/18  Yes Danford, Katy D, NP  MATZIM LA 360 MG 24 hr tablet TAKE 1 TABLET (360 MG TOTAL) BY MOUTH DAILY. MUST KEEP UPCOMING APPT FOR FURTHER REFILLS Patient taking differently: Take 360 mg by mouth daily.  07/06/18  Yes Hilty, Nadean Corwin, MD  Potassium 75 MG TABS Take 1 tablet daily by mouth.    Yes [provider]  Vitamin D, Ergocalciferol, (DRISDOL) 50000 units CAPS capsule TAKE ONE CAPSULE BY MOUTH ONE TIME PER WEEK Patient taking differently: Take 50,000 Units by mouth every 7 (seven) days.   11/27/17  Yes Danford, Valetta Fuller D, NP  chlorhexidine gluconate, MEDLINE KIT, (PERIDEX) 0.12 % solution Use as directed 15 mLs in the mouth or throat 2 (two) times daily. Patient not taking: Reported on 10/12/2018 03/02/18   Isla Pence, MD    Family History Family History  Problem Relation Age of Onset   Heart attack Father 70   Lung cancer Other        uncle, non smoker   Leukemia Other        uncle   Cancer Mother        oral   Stroke Mother    Cancer Maternal Aunt        breast   Alcohol abuse Maternal Aunt    Alcohol abuse Maternal Uncle    Cancer Maternal Uncle        lung, stomach, oral   Cancer Paternal Uncle        lung   Congestive Heart Failure Maternal Grandfather    Heart attack Paternal Grandfather    Cancer Paternal Uncle        bone   Colon cancer Neg Hx    Breast cancer Neg Hx    Diabetes Neg Hx     Social History Social History   Tobacco Use   Smoking status: Never Smoker   Smokeless tobacco: Never Used  Substance Use Topics   Alcohol use: No   Drug use: No     Allergies   Ace inhibitors and Lisinopril   Review of Systems Review of Systems  Constitutional: Positive for fatigue and fever.  HENT: Negative for sore throat.   Eyes: Negative for visual disturbance.  Respiratory: Negative for cough and shortness of breath.   Cardiovascular: Negative for chest pain.  Gastrointestinal: Positive for abdominal pain. Negative for constipation, diarrhea, nausea and vomiting.  Genitourinary: Positive for dysuria and flank pain. Negative for hematuria.  Musculoskeletal: Positive for back pain and myalgias. Negative for neck pain and neck stiffness.  Skin: Negative for rash and wound.  Neurological: Negative for dizziness, weakness, light-headedness, numbness and headaches.  All other systems reviewed and are negative.    Physical Exam Updated Vital Signs BP (!) 136/92 (BP Location: Right Arm)    Pulse 78    Temp 98.7 F (37.1 C)  (Axillary)    Resp 20    SpO2 95%   Physical Exam Vitals signs and nursing note reviewed.  Constitutional:      Appearance: Normal appearance. She is well-developed.  HENT:     Head: Normocephalic and atraumatic.     Nose: Nose normal.     Mouth/Throat:     Mouth: Mucous membranes are moist.  Eyes:     Pupils: Pupils are equal, round, and reactive to light.  Neck:     Musculoskeletal: Normal range of motion and neck supple.  Cardiovascular:     Rate and Rhythm: Normal rate and regular  rhythm.     Heart sounds: No murmur. No friction rub. No gallop.   Pulmonary:     Effort: Pulmonary effort is normal. No respiratory distress.     Breath sounds: Normal breath sounds. No stridor. No wheezing, rhonchi or rales.  Chest:     Chest wall: No tenderness.  Abdominal:     General: Bowel sounds are normal.     Palpations: Abdomen is soft.     Tenderness: There is abdominal tenderness. There is no right CVA tenderness, left CVA tenderness, guarding or rebound.     Comments: Mild diffuse lower abdominal tenderness to palpation.  No rebound or guarding.  Musculoskeletal: Normal range of motion.        General: No tenderness.     Comments: No midline thoracic or lumbar tenderness.  No CVA tenderness.  Skin:    General: Skin is warm and dry.     Capillary Refill: Capillary refill takes less than 2 seconds.     Findings: No erythema or rash.  Neurological:     General: No focal deficit present.     Mental Status: She is alert and oriented to person, place, and time.  Psychiatric:        Mood and Affect: Mood normal.        Behavior: Behavior normal.      ED Treatments / Results  Labs (all labs ordered are listed, but only abnormal results are displayed) Labs Reviewed  URINALYSIS, ROUTINE W REFLEX MICROSCOPIC - Abnormal; Notable for the following components:      Result Value   APPearance CLOUDY (*)    Specific Gravity, Urine >1.030 (*)    Hgb urine dipstick MODERATE (*)     Bilirubin Urine SMALL (*)    Protein, ur 100 (*)    Leukocytes,Ua SMALL (*)    All other components within normal limits  CBC WITH DIFFERENTIAL/PLATELET - Abnormal; Notable for the following components:   WBC 20.6 (*)    Neutro Abs 16.1 (*)    Monocytes Absolute 2.3 (*)    Abs Immature Granulocytes 0.10 (*)    All other components within normal limits  COMPREHENSIVE METABOLIC PANEL - Abnormal; Notable for the following components:   Potassium 3.4 (*)    CO2 20 (*)    Glucose, Bld 142 (*)    Creatinine, Ser 1.01 (*)    AST 14 (*)    GFR calc non Af Amer 55 (*)    All other components within normal limits  URINALYSIS, MICROSCOPIC (REFLEX) - Abnormal; Notable for the following components:   Bacteria, UA MANY (*)    All other components within normal limits  CBC - Abnormal; Notable for the following components:   WBC 19.7 (*)    All other components within normal limits  TSH - Abnormal; Notable for the following components:   TSH 0.075 (*)    All other components within normal limits  SARS CORONAVIRUS 2 (HOSP ORDER, PERFORMED IN [place] [facility] VIA ABBOTT ID)  SARS CORONAVIRUS 2 (HOSPITAL ORDER, PERFORMED IN [facility])  CULTURE, BLOOD (ROUTINE X 2)  CULTURE, BLOOD (ROUTINE X 2)  URINE CULTURE  LACTIC ACID, PLASMA  LIPASE, BLOOD  LACTIC ACID, PLASMA  PROCALCITONIN  PROTIME-INR  APTT  CREATININE, SERUM    EKG None  Radiology Ct Abdomen Pelvis W Contrast  Result Date: 10/11/2018 CLINICAL DATA:  Patient with right flank pain and worsening fatigue. EXAM: CT ABDOMEN AND PELVIS WITH CONTRAST TECHNIQUE: Multidetector CT  imaging of the abdomen and pelvis was performed using the standard protocol following bolus administration of intravenous contrast. CONTRAST:  118m OMNIPAQUE IOHEXOL 300 MG/ML  SOLN COMPARISON:  CT abdomen pelvis 06/26/2018 FINDINGS: Lower chest: Normal heart size. Minimal atelectasis right lower lobe. No pleural effusion. Hepatobiliary: Liver is  normal in size and contour. No focal hepatic lesions identified. Prior cholecystectomy. No intrahepatic or extrahepatic biliary ductal dilatation. Pancreas: Unremarkable Spleen: Unremarkable Adrenals/Urinary Tract: Normal adrenal glands. There is patchy hypoenhancement within the mid and superior pole of the left kidney (image 27; series 2). There is left perinephric fat stranding. Mild patchy hypoenhancement within the midpole of the right kidney. No hydronephrosis. Urinary bladder is unremarkable. Stomach/Bowel: Sigmoid colonic diverticulosis. No CT evidence for acute diverticulitis. Small hiatal hernia. Normal morphology of the stomach. No evidence for small bowel obstruction. No free fluid or free intraperitoneal air. Vascular/Lymphatic: Normal caliber abdominal aorta. Peripheral calcified atherosclerotic plaque. No retroperitoneal lymphadenopathy. Reproductive: Uterus is unremarkable. Similar-appearing 4.5 cm left ovarian cyst (image 59; series 2). Other: None. Musculoskeletal: Lumbar spine degenerative changes. No aggressive or acute appearing osseous lesions. IMPRESSION: 1. There is patchy hypoenhancement involving the left kidney with perinephric fat stranding raising the possibility of pyelonephritis. 2. Additionally there is suggestion of mild hypoenhancement within the midpole of the right kidney which may also represent pyelonephritis. 3. There is a 4.5 cm left ovarian cyst. This needs further evaluation with pelvic ultrasound in the non acute setting. Electronically Signed   By: DLovey NewcomerM.D.   On: 10/11/2018 23:03   Dg Chest Port 1 View  Result Date: 10/12/2018 CLINICAL DATA:  Sepsis EXAM: PORTABLE CHEST 1 VIEW COMPARISON:  09/28/2018, CT 05/22/2018 FINDINGS: Surgical hardware in the cervical spine. No focal consolidation or effusion. Mild cardiomegaly. No pneumothorax IMPRESSION: No active disease.  Mild cardiomegaly Electronically Signed   By: KDonavan FoilM.D.   On: 10/12/2018 03:26     Procedures Procedures (including critical care time)  Medications Ordered in ED Medications  levothyroxine (SYNTHROID) tablet 150 mcg (150 mcg Oral Given 10/12/18 0538)  enoxaparin (LOVENOX) injection 40 mg (40 mg Subcutaneous Given 10/12/18 1118)  cefTRIAXone (ROCEPHIN) 1 g in sodium chloride 0.9 % 100 mL IVPB (has no administration in time range)  diltiazem (CARDIZEM CD) 24 hr capsule 360 mg (360 mg Oral Given 10/12/18 1115)  sodium chloride flush (NS) 0.9 % injection 10-40 mL (has no administration in time range)  acetaminophen (TYLENOL) tablet 650 mg (has no administration in time range)  sodium chloride 0.9 % bolus 500 mL (0 mLs Intravenous Stopped 10/12/18 0015)  morphine 4 MG/ML injection 4 mg (4 mg Intravenous Given 10/11/18 2200)  iohexol (OMNIPAQUE) 300 MG/ML solution 100 mL (100 mLs Intravenous Contrast Given 10/11/18 2241)  cefTRIAXone (ROCEPHIN) 1 g in sodium chloride 0.9 % 100 mL IVPB (0 g Intravenous Stopped 10/12/18 0016)  potassium chloride SA (K-DUR) CR tablet 40 mEq (40 mEq Oral Given 10/12/18 0311)  lactated ringers bolus 500 mL (500 mLs Intravenous New Bag/Given 10/12/18 0531)     Initial Impression / Assessment and Plan / ED Course  I have reviewed the triage vital signs and the nursing notes.  Pertinent labs & imaging results that were available during my care of the patient were reviewed by me and considered in my medical decision making (see chart for details).       Patient with evidence of urinary tract infection.  Urine sent for culture and will start on Rocephin.  Signed out to oncoming  emergency provider pending CT results and disposition.   Final Clinical Impressions(s) / ED Diagnoses   Final diagnoses:  Pyelonephritis    ED Discharge Orders    None       Julianne Rice, MD 10/12/18 [phone]

## 2018-10-11 NOTE — ED Triage Notes (Signed)
Pt reports increasing fatigue over the last 2 weeks. States she is having "kidney pain" and had an episode of incontinence today. Reports right flank is tender to touch. Reports frequent UTIs

## 2018-10-11 NOTE — ED Notes (Signed)
CT awaiting results of CMP prior to imaging per radiology guidelines

## 2018-10-12 ENCOUNTER — Observation Stay (HOSPITAL_COMMUNITY): Payer: Medicare Other

## 2018-10-12 DIAGNOSIS — E538 Deficiency of other specified B group vitamins: Secondary | ICD-10-CM | POA: Diagnosis present

## 2018-10-12 DIAGNOSIS — A411 Sepsis due to other specified staphylococcus: Secondary | ICD-10-CM | POA: Diagnosis present

## 2018-10-12 DIAGNOSIS — N10 Acute tubulo-interstitial nephritis: Secondary | ICD-10-CM

## 2018-10-12 DIAGNOSIS — E785 Hyperlipidemia, unspecified: Secondary | ICD-10-CM | POA: Diagnosis present

## 2018-10-12 DIAGNOSIS — Z9049 Acquired absence of other specified parts of digestive tract: Secondary | ICD-10-CM | POA: Diagnosis not present

## 2018-10-12 DIAGNOSIS — Z8249 Family history of ischemic heart disease and other diseases of the circulatory system: Secondary | ICD-10-CM | POA: Diagnosis not present

## 2018-10-12 DIAGNOSIS — Z888 Allergy status to other drugs, medicaments and biological substances status: Secondary | ICD-10-CM | POA: Diagnosis not present

## 2018-10-12 DIAGNOSIS — Z79890 Hormone replacement therapy (postmenopausal): Secondary | ICD-10-CM | POA: Diagnosis not present

## 2018-10-12 DIAGNOSIS — A4151 Sepsis due to Escherichia coli [E. coli]: Secondary | ICD-10-CM | POA: Diagnosis present

## 2018-10-12 DIAGNOSIS — Z806 Family history of leukemia: Secondary | ICD-10-CM | POA: Diagnosis not present

## 2018-10-12 DIAGNOSIS — K219 Gastro-esophageal reflux disease without esophagitis: Secondary | ICD-10-CM | POA: Diagnosis present

## 2018-10-12 DIAGNOSIS — I10 Essential (primary) hypertension: Secondary | ICD-10-CM | POA: Diagnosis present

## 2018-10-12 DIAGNOSIS — E039 Hypothyroidism, unspecified: Secondary | ICD-10-CM | POA: Diagnosis present

## 2018-10-12 DIAGNOSIS — N83202 Unspecified ovarian cyst, left side: Secondary | ICD-10-CM | POA: Diagnosis present

## 2018-10-12 DIAGNOSIS — Z20828 Contact with and (suspected) exposure to other viral communicable diseases: Secondary | ICD-10-CM | POA: Diagnosis present

## 2018-10-12 DIAGNOSIS — H9190 Unspecified hearing loss, unspecified ear: Secondary | ICD-10-CM | POA: Diagnosis present

## 2018-10-12 DIAGNOSIS — N12 Tubulo-interstitial nephritis, not specified as acute or chronic: Secondary | ICD-10-CM

## 2018-10-12 DIAGNOSIS — Z823 Family history of stroke: Secondary | ICD-10-CM | POA: Diagnosis not present

## 2018-10-12 DIAGNOSIS — M545 Low back pain: Secondary | ICD-10-CM | POA: Diagnosis present

## 2018-10-12 DIAGNOSIS — Z1611 Resistance to penicillins: Secondary | ICD-10-CM | POA: Diagnosis present

## 2018-10-12 DIAGNOSIS — E876 Hypokalemia: Secondary | ICD-10-CM | POA: Diagnosis present

## 2018-10-12 DIAGNOSIS — Z801 Family history of malignant neoplasm of trachea, bronchus and lung: Secondary | ICD-10-CM | POA: Diagnosis not present

## 2018-10-12 DIAGNOSIS — G8929 Other chronic pain: Secondary | ICD-10-CM | POA: Diagnosis present

## 2018-10-12 DIAGNOSIS — I712 Thoracic aortic aneurysm, without rupture: Secondary | ICD-10-CM | POA: Diagnosis present

## 2018-10-12 DIAGNOSIS — M797 Fibromyalgia: Secondary | ICD-10-CM | POA: Diagnosis present

## 2018-10-12 LAB — CBC
HCT: 43.5 % (ref 36.0–46.0)
Hemoglobin: 13.8 g/dL (ref 12.0–15.0)
MCH: 31.3 pg (ref 26.0–34.0)
MCHC: 31.7 g/dL (ref 30.0–36.0)
MCV: 98.6 fL (ref 80.0–100.0)
Platelets: 313 10*3/uL (ref 150–400)
RBC: 4.41 MIL/uL (ref 3.87–5.11)
RDW: 12.7 % (ref 11.5–15.5)
WBC: 19.7 10*3/uL — ABNORMAL HIGH (ref 4.0–10.5)
nRBC: 0 % (ref 0.0–0.2)

## 2018-10-12 LAB — TSH: TSH: 0.075 u[IU]/mL — ABNORMAL LOW (ref 0.350–4.500)

## 2018-10-12 LAB — PROCALCITONIN: Procalcitonin: 0.85 ng/mL

## 2018-10-12 LAB — CREATININE, SERUM
Creatinine, Ser: 0.86 mg/dL (ref 0.44–1.00)
GFR calc Af Amer: >60 mL/min (ref >60)
GFR calc non Af Amer: >60 mL/min (ref >60)

## 2018-10-12 LAB — SARS CORONAVIRUS 2 BY RT PCR (HOSPITAL ORDER, PERFORMED IN ~~LOC~~ HOSPITAL LAB): SARS Coronavirus 2: NEGATIVE

## 2018-10-12 LAB — PROTIME-INR
INR: 1.1 (ref 0.8–1.2)
Prothrombin Time: 13.9 seconds (ref 11.4–15.2)

## 2018-10-12 LAB — LACTIC ACID, PLASMA: Lactic Acid, Venous: 1.4 mmol/L (ref 0.5–1.9)

## 2018-10-12 LAB — APTT: aPTT: 31 seconds (ref 24–36)

## 2018-10-12 MED ORDER — ACETAMINOPHEN 325 MG PO TABS
650.0000 mg | ORAL_TABLET | Freq: Four times a day (QID) | ORAL | Status: DC | PRN
  Administered 2018-10-12 (×2): 650 mg via ORAL
  Filled 2018-10-12 (×2): qty 2

## 2018-10-12 MED ORDER — SODIUM CHLORIDE 0.9% FLUSH: 10.0000 mL | INTRAVENOUS | Status: DC | PRN

## 2018-10-12 MED ORDER — LACTATED RINGERS IV BOLUS
500.0000 mL | Freq: Once | INTRAVENOUS | Status: AC
  Administered 2018-10-12: 500 mL via INTRAVENOUS

## 2018-10-12 MED ORDER — POTASSIUM CHLORIDE CRYS ER 20 MEQ PO TBCR
40.0000 meq | EXTENDED_RELEASE_TABLET | Freq: Once | ORAL | Status: AC
  Administered 2018-10-12: 40 meq via ORAL
  Filled 2018-10-12: qty 2

## 2018-10-12 MED ORDER — LEVOTHYROXINE SODIUM 50 MCG PO TABS
150.0000 ug | ORAL_TABLET | Freq: Every day | ORAL | Status: DC
  Administered 2018-10-12 – 2018-10-15 (×4): 150 ug via ORAL
  Filled 2018-10-12 (×4): qty 1

## 2018-10-12 MED ORDER — ENOXAPARIN SODIUM 40 MG/0.4ML ~~LOC~~ SOLN
40.0000 mg | SUBCUTANEOUS | Status: DC
  Administered 2018-10-12 – 2018-10-14 (×3): 40 mg via SUBCUTANEOUS
  Filled 2018-10-12 (×3): qty 0.4

## 2018-10-12 MED ORDER — SODIUM CHLORIDE 0.9 % IV SOLN
1.0000 g | INTRAVENOUS | Status: DC
  Administered 2018-10-12 – 2018-10-13 (×2): 1 g via INTRAVENOUS
  Filled 2018-10-12 (×2): qty 1

## 2018-10-12 MED ORDER — DILTIAZEM HCL ER COATED BEADS 360 MG PO TB24: 360.0000 mg | ORAL_TABLET | Freq: Every day | ORAL | Status: DC

## 2018-10-12 MED ORDER — DILTIAZEM HCL ER COATED BEADS 120 MG PO CP24
360.0000 mg | ORAL_CAPSULE | Freq: Every day | ORAL | Status: DC
  Administered 2018-10-12 – 2018-10-15 (×4): 360 mg via ORAL
  Filled 2018-10-12 (×4): qty 3

## 2018-10-12 MED ORDER — ACETAMINOPHEN 325 MG PO TABS
650.0000 mg | ORAL_TABLET | Freq: Four times a day (QID) | ORAL | Status: DC | PRN
  Administered 2018-10-12 – 2018-10-15 (×9): 650 mg via ORAL
  Filled 2018-10-12 (×9): qty 2

## 2018-10-12 NOTE — ED Notes (Signed)
ED TO INPATIENT HANDOFF REPORT  ED Nurse Name and Phone #: Gilda Crease Name/Age/Gender Kayla [name] 73 y.o. female Room/Bed: MH09/MH09  Code Status   Code Status: Not on file  Home/SNF/Other Home Patient oriented to: self, place, time and situation Is this baseline? Yes   Triage Complete: Triage complete  Chief Complaint Back & Flank Pain (suspect UTI)  Triage Note Pt reports increasing fatigue over the last 2 weeks. States she is having "kidney pain" and had an episode of incontinence today. Reports right flank is tender to touch. Reports frequent UTIs   Allergies Allergies  Allergen Reactions  . Ace Inhibitors Anaphylaxis    REACTION: glossal edema  . Lisinopril Anaphylaxis    Level of Care/Admitting Diagnosis ED Disposition    ED Disposition Condition [facility]: [place] [[id number]]  Level of Care: Med-Surg [16]  Covid Evaluation: Asymptomatic Screening Protocol (No Symptoms)  Diagnosis: Pyelonephritis [756433]  Admitting Physician: [place] ERIC J [[id number]]  Attending Physician: [place] ERIC J [[id number]]  Estimated length of stay: past midnight tomorrow  Certification:: I certify this patient will need inpatient services for at least 2 midnights  PT Class (Do Not Modify): Inpatient [101]  PT Acc Code (Do Not Modify): Private [1]       B Medical/Surgery History Past Medical History:  Diagnosis Date  . Anemia   . Asthma   . B12 deficiency   . Chronic LBP   . Diastolic dysfunction   . Fibromyalgia   . GERD (gastroesophageal reflux disease)   . Hyperlipemia   . Hypertension   . Hypothyroid   . IBS (irritable bowel syndrome)   . S/P cardiac cath 11/30/2007   normal coronaries - Dr. Burt Knack (Dr. Debara Pickett reviewed films on 01/10/2016)   Past Surgical History:  Procedure Laterality Date  . CESAREAN SECTION    . CHOLECYSTECTOMY    . [place]     A IV Location/Drains/Wounds Patient Lines/Drains/Airways  Status   Active Line/Drains/Airways    Name:   Placement date:   Placement time:   Site:   Days:   Peripheral IV 10/11/18 Left Antecubital   10/11/18    2159    Antecubital   1          Intake/Output Last 24 hours  Intake/Output Summary (Last 24 hours) at 10/12/2018 0041 Last data filed at 10/12/2018 0016 Gross per 24 hour  Intake 600 ml  Output -  Net 600 ml    Labs/Imaging Results for orders placed or performed during the hospital encounter of 10/11/18 (from the past 48 hour(s))  Urinalysis, Routine w reflex microscopic- may I&O cath if menses     Status: Abnormal   Collection Time: 10/11/18  9:04 PM  Result Value Ref Range   Color, Urine YELLOW YELLOW   APPearance CLOUDY (A) CLEAR   Specific Gravity, Urine >1.030 (H) 1.005 - 1.030   pH 5.5 5.0 - 8.0   Glucose, UA NEGATIVE NEGATIVE mg/dL   Hgb urine dipstick MODERATE (A) NEGATIVE   Bilirubin Urine SMALL (A) NEGATIVE   Ketones, ur NEGATIVE NEGATIVE mg/dL   Protein, ur 100 (A) NEGATIVE mg/dL   Nitrite NEGATIVE NEGATIVE   Leukocytes,Ua SMALL (A) NEGATIVE    Comment: Performed at [facility], [address] [place], [place] [address]  Urinalysis, Microscopic (reflex)     Status: Abnormal   Collection Time: 10/11/18  9:04 PM  Result Value Ref Range  RBC / HPF 11-20 0 - 5 RBC/hpf   WBC, UA >50 0 - 5 WBC/hpf   Bacteria, UA MANY (A) NONE SEEN   Squamous Epithelial / LPF 11-20 0 - 5    Comment: Performed at [facility], [address] [place], [place] [address]  CBC with Differential/Platelet     Status: Abnormal   Collection Time: 10/11/18  9:53 PM  Result Value Ref Range   WBC 20.6 (H) 4.0 - 10.5 K/uL   RBC 4.72 3.87 - 5.11 MIL/uL   Hemoglobin 14.5 12.0 - 15.0 g/dL   HCT 45.6 36.0 - 46.0 %   MCV 96.6 80.0 - 100.0 fL   MCH 30.7 26.0 - 34.0 pg   MCHC 31.8 30.0 - 36.0 g/dL   RDW 12.5 11.5 - 15.5 %   Platelets 319 150 - 400 K/uL   nRBC 0.0 0.0 - 0.2 %   Neutrophils Relative % 78 %   Neutro Abs  16.1 (H) 1.7 - 7.7 K/uL   Lymphocytes Relative 10 %   Lymphs Abs 2.1 0.7 - 4.0 K/uL   Monocytes Relative 11 %   Monocytes Absolute 2.3 (H) 0.1 - 1.0 K/uL   Eosinophils Relative 0 %   Eosinophils Absolute 0.0 0.0 - 0.5 K/uL   Basophils Relative 0 %   Basophils Absolute 0.1 0.0 - 0.1 K/uL   Immature Granulocytes 1 %   Abs Immature Granulocytes 0.10 (H) 0.00 - 0.07 K/uL    Comment: Performed at [facility], [address] [place], [place] [address]  Comprehensive metabolic panel     Status: Abnormal   Collection Time: 10/11/18  9:53 PM  Result Value Ref Range   Sodium 139 135 - 145 mmol/L   Potassium 3.4 (L) 3.5 - 5.1 mmol/L   Chloride 107 98 - 111 mmol/L   CO2 20 (L) 22 - 32 mmol/L   Glucose, Bld 142 (H) 70 - 99 mg/dL   BUN 15 8 - 23 mg/dL   Creatinine, Ser 1.01 (H) 0.44 - 1.00 mg/dL   Calcium 9.2 8.9 - 10.3 mg/dL   Total Protein 7.7 6.5 - 8.1 g/dL   Albumin 3.8 3.5 - 5.0 g/dL   AST 14 (L) 15 - 41 U/L   ALT 13 0 - 44 U/L   Alkaline Phosphatase 82 38 - 126 U/L   Total Bilirubin 0.9 0.3 - 1.2 mg/dL   GFR calc non Af Amer 55 (L) >60 mL/min   GFR calc Af Amer >60 >60 mL/min   Anion gap 12 5 - 15    Comment: Performed at [facility], [address] [place], [place] [address]  Lactic acid, plasma     Status: None   Collection Time: 10/11/18  9:53 PM  Result Value Ref Range   Lactic Acid, Venous 0.8 0.5 - 1.9 mmol/L    Comment: Performed at [facility], [address] [place], [place] [address]  Lipase, blood     Status: None   Collection Time: 10/11/18  9:53 PM  Result Value Ref Range   Lipase 33 11 - 51 U/L    Comment: Performed at [facility], [address] [place], [place] [address]  SARS Coronavirus 2 (Hosp order,Performed in [facility] lab via Abbott ID)     Status: None   Collection Time: 10/11/18 11:26 PM   Specimen: Dry Nasal Swab (Abbott ID Now)  Result Value Ref  Range   SARS Coronavirus 2 (Abbott ID Now) NEGATIVE  NEGATIVE    Comment: (NOTE) SARS-CoV-2 target nucleic acids are NOT DETECTED. The SARS-CoV-2 RNA is generally detectable in upper and lower respiratory specimens during the acute phase of infection.  Negativeresults do not preclude SARS-CoV-2 infection, do not rule out coinfections with other pathogens, and should not be used as the  sole basis for treatment or other patient management decisions.  Negative results must be combined with clinical observations, patient history, and epidemiological information. The expected result is Negative. Fact Sheet for Patients: GolfingFamily.no Fact Sheet for Healthcare Providers: https://www.hernandez-brewer.com/ This test is not yet approved or cleared by the [place] FDA and  has been authorized for detection and/or diagnosis of SARS-CoV-2 by FDA under an Emergency Use Authorization (EUA).  This EUA will remain in effect (meaning this test can be used) for the duration of  the COVID19 declaration under Section 5 64(b)(1) of the Act, 21 U.S.C.  section [phone] 3(b)(1), unless the authorization is terminated or revoked sooner. Performed at [facility], [address] [place], [place] [address]    Ct Abdomen Pelvis W Contrast  Result Date: 10/11/2018 CLINICAL DATA:  Patient with right flank pain and worsening fatigue. EXAM: CT ABDOMEN AND PELVIS WITH CONTRAST TECHNIQUE: Multidetector CT imaging of the abdomen and pelvis was performed using the standard protocol following bolus administration of intravenous contrast. CONTRAST:  167mL OMNIPAQUE IOHEXOL 300 MG/ML  SOLN COMPARISON:  CT abdomen pelvis 06/26/2018 FINDINGS: Lower chest: Normal heart size. Minimal atelectasis right lower lobe. No pleural effusion. Hepatobiliary: Liver is normal in size and contour. No focal hepatic lesions identified. Prior cholecystectomy. No intrahepatic or extrahepatic biliary ductal dilatation. Pancreas: Unremarkable  Spleen: Unremarkable Adrenals/Urinary Tract: Normal adrenal glands. There is patchy hypoenhancement within the mid and superior pole of the left kidney (image 27; series 2). There is left perinephric fat stranding. Mild patchy hypoenhancement within the midpole of the right kidney. No hydronephrosis. Urinary bladder is unremarkable. Stomach/Bowel: Sigmoid colonic diverticulosis. No CT evidence for acute diverticulitis. Small hiatal hernia. Normal morphology of the stomach. No evidence for small bowel obstruction. No free fluid or free intraperitoneal air. Vascular/Lymphatic: Normal caliber abdominal aorta. Peripheral calcified atherosclerotic plaque. No retroperitoneal lymphadenopathy. Reproductive: Uterus is unremarkable. Similar-appearing 4.5 cm left ovarian cyst (image 59; series 2). Other: None. Musculoskeletal: Lumbar spine degenerative changes. No aggressive or acute appearing osseous lesions. IMPRESSION: 1. There is patchy hypoenhancement involving the left kidney with perinephric fat stranding raising the possibility of pyelonephritis. 2. Additionally there is suggestion of mild hypoenhancement within the midpole of the right kidney which may also represent pyelonephritis. 3. There is a 4.5 cm left ovarian cyst. This needs further evaluation with pelvic ultrasound in the non acute setting. Electronically Signed   By: Lovey Newcomer M.D.   On: 10/11/2018 23:03    Pending Labs Unresulted Labs (From admission, onward)    Start     Ordered   10/11/18 2142  Urine culture  ONCE - STAT,   STAT     10/11/18 2141   10/11/18 2134  Culture, blood (Routine X 2) w Reflex to ID Panel  BLOOD CULTURE X 2,   STAT     10/11/18 2135          Vitals/Pain Today's Vitals   10/11/18 2118 10/11/18 2330 10/12/18 0000 10/12/18 0030  BP:  (!) 166/89 (!) 143/97   Pulse:  93 86 87  Resp:  Temp:      TempSrc:      SpO2:  98% 100% 99%  PainSc: 5        Isolation Precautions No active  isolations  Medications Medications  sodium chloride 0.9 % bolus 500 mL (0 mLs Intravenous Stopped 10/12/18 0015)  morphine 4 MG/ML injection 4 mg (4 mg Intravenous Given 10/11/18 2200)  iohexol (OMNIPAQUE) 300 MG/ML solution 100 mL (100 mLs Intravenous Contrast Given 10/11/18 2241)  cefTRIAXone (ROCEPHIN) 1 g in sodium chloride 0.9 % 100 mL IVPB (0 g Intravenous Stopped 10/12/18 0016)    Mobility walks High fall risk   Focused Assessments Cardiac Assessment Handoff:  Cardiac Rhythm: Sinus tachycardia Lab Results  Component Value Date   CKTOTAL 189 (H) 10/21/2007   CKMB 4.6 (H) 10/21/2007   TROPONINI <0.03 07/01/2015   Lab Results  Component Value Date   DDIMER (H) 10/20/2007    0.81        AT THE INHOUSE ESTABLISHED CUTOFF VALUE OF 0.48 ug/mL FEU, THIS ASSAY HAS BEEN DOCUMENTED IN THE LITERATURE TO HAVE   Does the Patient currently have chest pain? No     R Recommendations: See Admitting Provider Note  Report given to:   Additional Notes:

## 2018-10-12 NOTE — Progress Notes (Addendum)
PROGRESS NOTE   Kayla [name]  DGL:[id number]    DOB: 1946-03-10    DOA: 10/11/2018  PCP: [name], NP   I have briefly reviewed patients previous medical records in [facility].  Chief Complaint  Patient presents with  . Fatigue    ?UTI    Brief Narrative:  73 year old female with PMH of fibromyalgia, GERD, HLD, HTN, hypothyroid, anxiety and depression and ascending aortic aneurysm transferred from [facility] ED due to flank pain, urinary frequency, malodorous urine, fatigue and subjective fevers.  Admitted for sepsis due to acute pyelonephritis.   Assessment & Plan:   Active Problems:   Pyelonephritis   Acute pyelonephritis   Sepsis due to acute pyelonephritis  Met sepsis criteria on admission including fever of 102.5 F, tachypnea, tachycardia, high blood pressure, neutrophilic leukocytosis and suspected urinary source.  Abdomen and pelvis showed likely perinephric inflammation consistent with pyelonephritis.  Treated with IV fluids and started empirically on IV ceftriaxone pending urine and blood culture results.  Sepsis physiology resolved.  Clinically improving.  Continue gentle IV fluids and antibiotics.  COVID-19 testing negative.  Hypokalemia  Replace and follow.  Check magnesium.  Essential hypertension Mildly uncontrolled at times.  Continue home diltiazem 360 mg daily  Hypothyroidism Continue levothyroxine 150 mcg daily. TSH 0.075, suppressed.  Clinically euthyroid.  Deferred to outpatient follow-up with PCP to determine if does need to be lowered.  She has consistently suppressed TSH level since February and also had increased free T4 levels in April.  Hyperlipidemia Reportedly allergic to statins.  Ascending aortic aneurysm TTE 04/29/2018 notable for ascending aortic aneurysm 43 mm.  CTA chest on 05/22/2018 noted stable 4.2 cm ascending aortic aneurysm with no interval change.  Continue outpatient surveillance.  Left ovarian cyst CT  abdomen and pelvis notable for 4.5 cm left ovarian cyst: Recommend outpatient pelvic ultrasound and follow-up.     DVT prophylaxis: Lovenox Code Status: Full Family Communication: None at bedside Disposition: DC home pending clinical improvement.  Patient had high fevers of 102.5 at 4:00 this morning including tachypnea and tachycardia.  She needs inpatient evaluation and management including IV antibiotics pending adjustment based on culture results.  Thereby changed from observation to inpatient status.   Consultants:  None  Procedures:  None  Antimicrobials:  IV ceftriaxone   Subjective: Overall feels better.  Has a headache this morning.  Unable to rest well last night.  Fevers down.  No dysuria.  Mild left flank pain especially on touching.  Disappointed that she will not be discharged today because she is expecting her nephew to visit for 2 days and she would not be able to visit him.   Objective:  Vitals:   10/12/18 0622 10/12/18 0624 10/12/18 0650 10/12/18 1326  BP: (!) 154/91  140/78 (!) 136/92  Pulse: (!) 102  98 78  Resp: 20  20 20   Temp: (!) 97.5 F (36.4 C) (!) 100.8 F (38.2 C) 99.6 F (37.6 C) 98.7 F (37.1 C)  TempSrc: Oral Axillary Axillary Axillary  SpO2:    95%    Examination:  General exam: Pleasant elderly female, moderately built and nourished sitting up comfortably in bed without distress. Respiratory system: Clear to auscultation. Respiratory effort normal. Cardiovascular system: S1 & S2 heard, RRR. No JVD, murmurs, rubs, gallops or clicks. No pedal edema. Gastrointestinal system: Abdomen is nondistended, soft and nontender. No organomegaly or masses felt. Normal bowel sounds heard.  Mild left flank tenderness. Central nervous system: Alert  and oriented. No focal neurological deficits. Extremities: Symmetric 5 x 5 power. Skin: No rashes, lesions or ulcers Psychiatry: Judgement and insight appear normal. Mood & affect appropriate.      Data Reviewed: I have personally reviewed following labs and imaging studies  CBC: Recent Labs  Lab 10/11/18 2153 10/12/18 0449  WBC 20.6* 19.7*  NEUTROABS 16.1*  --   HGB 14.5 13.8  HCT 45.6 43.5  MCV 96.6 98.6  PLT 319 416   Basic Metabolic Panel: Recent Labs  Lab 10/11/18 2153 10/12/18 0449  NA 139  --   K 3.4*  --   CL 107  --   CO2 20*  --   GLUCOSE 142*  --   BUN 15  --   CREATININE 1.01* 0.86  CALCIUM 9.2  --    Liver Function Tests: Recent Labs  Lab 10/11/18 2153  AST 14*  ALT 13  ALKPHOS 82  BILITOT 0.9  PROT 7.7  ALBUMIN 3.8   Coagulation Profile: Recent Labs  Lab 10/12/18 0449  INR 1.1     Recent Results (from the past 240 hour(s))  SARS Coronavirus 2 (Hosp order,Performed in [facility] lab via Abbott ID)     Status: None   Collection Time: 10/11/18 11:26 PM   Specimen: Dry Nasal Swab (Abbott ID Now)  Result Value Ref Range Status   SARS Coronavirus 2 (Abbott ID Now) NEGATIVE NEGATIVE Final    Comment: (NOTE) SARS-CoV-2 target nucleic acids are NOT DETECTED. The SARS-CoV-2 RNA is generally detectable in upper and lower respiratory specimens during the acute phase of infection.  Negativeresults do not preclude SARS-CoV-2 infection, do not rule out coinfections with other pathogens, and should not be used as the  sole basis for treatment or other patient management decisions.  Negative results must be combined with clinical observations, patient history, and epidemiological information. The expected result is Negative. Fact Sheet for Patients: GolfingFamily.no Fact Sheet for Healthcare Providers: https://www.hernandez-brewer.com/ This test is not yet approved or cleared by the [place] FDA and  has been authorized for detection and/or diagnosis of SARS-CoV-2 by FDA under an Emergency Use Authorization (EUA).  This EUA will remain in effect (meaning this test can be used) for the duration of  the  COVID19 declaration under Section 5 64(b)(1) of the Act, 21 U.S.C.  section [phone] 3(b)(1), unless the authorization is terminated or revoked sooner. Performed at [facility], [address] [place], [place] [address]   SARS Coronavirus 2 (CEPHEID - Performed in [facility] hospital lab), Hosp Order     Status: None   Collection Time: 10/12/18  2:43 AM   Specimen: Nasopharyngeal Swab  Result Value Ref Range Status   SARS Coronavirus 2 NEGATIVE NEGATIVE Final    Comment: (NOTE) If result is NEGATIVE SARS-CoV-2 target nucleic acids are NOT DETECTED. The SARS-CoV-2 RNA is generally detectable in upper and lower  respiratory specimens during the acute phase of infection. The lowest  concentration of SARS-CoV-2 viral copies this assay can detect is 250  copies / mL. A negative result does not preclude SARS-CoV-2 infection  and should not be used as the sole basis for treatment or other  patient management decisions.  A negative result may occur with  improper specimen collection / handling, submission of specimen other  than nasopharyngeal swab, presence of viral mutation(s) within the  areas targeted by this assay, and inadequate number of viral copies  (<250 copies / mL). A negative result must  be combined with clinical  observations, patient history, and epidemiological information. If result is POSITIVE SARS-CoV-2 target nucleic acids are DETECTED. The SARS-CoV-2 RNA is generally detectable in upper and lower  respiratory specimens dur ing the acute phase of infection.  Positive  results are indicative of active infection with SARS-CoV-2.  Clinical  correlation with patient history and other diagnostic information is  necessary to determine patient infection status.  Positive results do  not rule out bacterial infection or co-infection with other viruses. If result is PRESUMPTIVE POSTIVE SARS-CoV-2 nucleic acids MAY BE PRESENT.   A presumptive positive result was  obtained on the submitted specimen  and confirmed on repeat testing.  While 2019 novel coronavirus  (SARS-CoV-2) nucleic acids may be present in the submitted sample  additional confirmatory testing may be necessary for epidemiological  and / or clinical management purposes  to differentiate between  SARS-CoV-2 and other Sarbecovirus currently known to infect humans.  If clinically indicated additional testing with an alternate test  methodology [phone]) is advised. The SARS-CoV-2 RNA is generally  detectable in upper and lower respiratory sp ecimens during the acute  phase of infection. The expected result is Negative. Fact Sheet for Patients:  StrictlyIdeas.no Fact Sheet for Healthcare Providers: BankingDealers.co.za This test is not yet approved or cleared by the [place] FDA and has been authorized for detection and/or diagnosis of SARS-CoV-2 by FDA under an Emergency Use Authorization (EUA).  This EUA will remain in effect (meaning this test can be used) for the duration of the COVID-19 declaration under Section 564(b)(1) of the Act, 21 U.S.C. section 360bbb-3(b)(1), unless the authorization is terminated or revoked sooner. Performed at [facility], [place] [address] [place], [place] [address]          Radiology Studies: Ct Abdomen Pelvis W Contrast  Result Date: 10/11/2018 CLINICAL DATA:  Patient with right flank pain and worsening fatigue. EXAM: CT ABDOMEN AND PELVIS WITH CONTRAST TECHNIQUE: Multidetector CT imaging of the abdomen and pelvis was performed using the standard protocol following bolus administration of intravenous contrast. CONTRAST:  140m OMNIPAQUE IOHEXOL 300 MG/ML  SOLN COMPARISON:  CT abdomen pelvis 06/26/2018 FINDINGS: Lower chest: Normal heart size. Minimal atelectasis right lower lobe. No pleural effusion. Hepatobiliary: Liver is normal in size and contour. No focal hepatic lesions  identified. Prior cholecystectomy. No intrahepatic or extrahepatic biliary ductal dilatation. Pancreas: Unremarkable Spleen: Unremarkable Adrenals/Urinary Tract: Normal adrenal glands. There is patchy hypoenhancement within the mid and superior pole of the left kidney (image 27; series 2). There is left perinephric fat stranding. Mild patchy hypoenhancement within the midpole of the right kidney. No hydronephrosis. Urinary bladder is unremarkable. Stomach/Bowel: Sigmoid colonic diverticulosis. No CT evidence for acute diverticulitis. Small hiatal hernia. Normal morphology of the stomach. No evidence for small bowel obstruction. No free fluid or free intraperitoneal air. Vascular/Lymphatic: Normal caliber abdominal aorta. Peripheral calcified atherosclerotic plaque. No retroperitoneal lymphadenopathy. Reproductive: Uterus is unremarkable. Similar-appearing 4.5 cm left ovarian cyst (image 59; series 2). Other: None. Musculoskeletal: Lumbar spine degenerative changes. No aggressive or acute appearing osseous lesions. IMPRESSION: 1. There is patchy hypoenhancement involving the left kidney with perinephric fat stranding raising the possibility of pyelonephritis. 2. Additionally there is suggestion of mild hypoenhancement within the midpole of the right kidney which may also represent pyelonephritis. 3. There is a 4.5 cm left ovarian cyst. This needs further evaluation with pelvic ultrasound in the non acute setting. Electronically Signed   By: DLovey NewcomerM.D.   On:  10/11/2018 23:03   Dg Chest Port 1 View  Result Date: 10/12/2018 CLINICAL DATA:  Sepsis EXAM: PORTABLE CHEST 1 VIEW COMPARISON:  09/28/2018, CT 05/22/2018 FINDINGS: Surgical hardware in the cervical spine. No focal consolidation or effusion. Mild cardiomegaly. No pneumothorax IMPRESSION: No active disease.  Mild cardiomegaly Electronically Signed   By: Donavan Foil M.D.   On: 10/12/2018 03:26        Scheduled Meds: . diltiazem  360 mg Oral Daily   . enoxaparin (LOVENOX) injection  40 mg Subcutaneous Q24H  . levothyroxine  150 mcg Oral Q0600   Continuous Infusions: . cefTRIAXone (ROCEPHIN)  IV       LOS: 0 days     Vernell Leep, MD, FACP, [facility]. [facility]  To contact the attending provider between 7A-7P or the covering provider during after hours 7P-7A, please log into the web site [email or web address] and access using universal [place] password for that web site. If you do not have the password, please call the hospital operator.  10/12/2018, 2:39 PM

## 2018-10-12 NOTE — H&P (Signed)
History and Physical    Kayla [name] [id number]:[id number] DOB: 21-Jan-1946 DOA: 10/11/2018  PCP: [name], NP  Patient coming from: Home  I have personally briefly reviewed patient's old medical records in [place]  Chief Complaint: Flank pain  HPI: Kayla [name] is a 73 y.o. female with medical history significant of anxiety/depression, fibromyalgia, osteoarthritis, hypothyroidism, hyperlipidemia, a sending aortic aneurysm, left ovarian cyst who presents as a transfer from [place] for chief complaint of flank pain and increased urinary frequency.  Patient reports onset of symptoms yesterday associated with malodorous urine.  Patient denied any dysuria or hematuria.  Symptoms continue to progress to include fatigue and subjective fevers.  Patient reports history of bladder infections in the past.  No other complaints or concerns at this time.  Patient specifically denies headache, no chills/night sweats, no nausea 7/diarrhea, no chest pain, no palpitations, no shortness of breath, no abdominal discomfort, no cough/congestion, no paresthesias.  ED Course: Temperature 100.7, HR 101, RR 26, BP 147/93, SPO2 95% on room air.  WBC count 20.6, hemoglobin 14.5, platelets 319.  Sodium 139, potassium 3.4, chloride 107, CO2 20, BUN 15, creatinine 1.01, platelets 142.  Lipase 33.  AST 14, ALT 13.  Lactic acid 0.8.  [facility] COVID-19 test negative.  Urinalysis small leukoesterase, negative nitrite, many bacteria, greater than 50 WBCs.  CT abdomen/pelvis suggestive of bilateral pyelonephrosis, 4.5 cm left ovarian cyst.  Patient was started on ceftriaxone and referred by EDP for transfer inpatient hospitalization given fever, tachycardia, and elevated WBC count.  Review of Systems: As per HPI otherwise 10 point review of systems negative.   Past Medical History:  Diagnosis Date   Anemia    Asthma    B12 deficiency    Chronic LBP    Diastolic dysfunction    Fibromyalgia    GERD  (gastroesophageal reflux disease)    Hyperlipemia    Hypertension    Hypothyroid    IBS (irritable bowel syndrome)    S/P cardiac cath 11/30/2007   normal coronaries - Dr. Burt Knack (Dr. Debara Pickett reviewed films on 01/10/2016)    Past Surgical History:  Procedure Laterality Date   [place]     reports that she has never smoked. She has never used smokeless tobacco. She reports that she does not drink alcohol or use drugs.  Allergies  Allergen Reactions   Ace Inhibitors Anaphylaxis    REACTION: glossal edema   Lisinopril Anaphylaxis    Family History  Problem Relation Age of Onset   Heart attack Father 60   Lung cancer Other        uncle, non smoker   Leukemia Other        uncle   Cancer Mother        oral   Stroke Mother    Cancer Maternal Aunt        breast   Alcohol abuse Maternal Aunt    Alcohol abuse Maternal Uncle    Cancer Maternal Uncle        lung, stomach, oral   Cancer Paternal Uncle        lung   Congestive Heart Failure Maternal Grandfather    Heart attack Paternal Grandfather    Cancer Paternal Uncle        bone   Colon cancer Neg Hx    Breast cancer Neg Hx    Diabetes Neg Hx  Prior to Admission medications   Medication Sig Start Date End Date Taking? Authorizing Provider  calcium-vitamin D (OSCAL WITH D) 250-125 MG-UNIT tablet Take 1 tablet by mouth daily.    [provider]  chlorhexidine gluconate, MEDLINE KIT, (PERIDEX) 0.12 % solution Use as directed 15 mLs in the mouth or throat 2 (two) times daily. 03/02/18   Isla Pence, MD  [employer] VITAMIN B12 1000 MCG tablet TAKE 1 TABLET (1,000 MCG TOTAL) BY MOUTH DAILY. 03/24/18   Danford, Valetta Fuller D, NP  ketoconazole (NIZORAL) 2 % shampoo Apply 1 application topically 2 (two) times a week. 10/30/17   Mina Marble D, NP  levothyroxine (SYNTHROID) 150 MCG tablet Take 1 tablet (150 mcg total) by mouth daily before breakfast.  09/08/18   Danford, Valetta Fuller D, NP  MATZIM LA 360 MG 24 hr tablet TAKE 1 TABLET (360 MG TOTAL) BY MOUTH DAILY. MUST KEEP UPCOMING APPT FOR FURTHER REFILLS 07/06/18   Pixie Casino, MD  Potassium 75 MG TABS Take 1 tablet daily by mouth.     [provider]  Vitamin D, Ergocalciferol, (DRISDOL) 50000 units CAPS capsule TAKE ONE CAPSULE BY MOUTH ONE TIME PER WEEK 11/27/17   Mina Marble D, NP    Physical Exam: Vitals:   10/12/18 0030 10/12/18 0045 10/12/18 0052 10/12/18 0216  BP:    (!) 175/93  Pulse: 87 87  95  Resp:    20  Temp:   98.7 F (37.1 C) 98.7 F (37.1 C)  TempSrc:   Oral Oral  SpO2: 99% 98%  99%    Constitutional: NAD, calm, comfortable Vitals:   10/12/18 0030 10/12/18 0045 10/12/18 0052 10/12/18 0216  BP:    (!) 175/93  Pulse: 87 87  95  Resp:    20  Temp:   98.7 F (37.1 C) 98.7 F (37.1 C)  TempSrc:   Oral Oral  SpO2: 99% 98%  99%   Eyes: PERRL, lids and conjunctivae normal ENMT: Mucous membranes are moist. Posterior pharynx clear of any exudate or lesions.Normal dentition.  Neck: normal, supple, no masses, no thyromegaly Respiratory: clear to auscultation bilaterally, no wheezing, no crackles. Normal respiratory effort. No accessory muscle use.  Cardiovascular: Regular rate and rhythm, no murmurs / rubs / gallops. No extremity edema. 2+ pedal pulses. No carotid bruits.  Abdomen: no tenderness, no masses palpated. No hepatosplenomegaly. Bowel sounds positive, mild bilateral CVA tenderness Musculoskeletal: no clubbing / cyanosis. No joint deformity upper and lower extremities. Good ROM, no contractures. Normal muscle tone.  Skin: no rashes, lesions, ulcers. No induration Neurologic: CN 2-12 grossly intact. Sensation intact, DTR normal. Strength 5/5 in all 4.  Psychiatric: Normal judgment and insight. Alert and oriented x 3. Normal mood.    Labs on Admission: I have personally reviewed following labs and imaging studies  CBC: Recent Labs  Lab  10/11/18 2153  WBC 20.6*  NEUTROABS 16.1*  HGB 14.5  HCT 45.6  MCV 96.6  PLT 387   Basic Metabolic Panel: Recent Labs  Lab 10/11/18 2153  NA 139  K 3.4*  CL 107  CO2 20*  GLUCOSE 142*  BUN 15  CREATININE 1.01*  CALCIUM 9.2   GFR: Estimated Creatinine Clearance: 56.5 mL/min (A) (by C-G formula based on SCr of 1.01 mg/dL (H)). Liver Function Tests: Recent Labs  Lab 10/11/18 2153  AST 14*  ALT 13  ALKPHOS 82  BILITOT 0.9  PROT 7.7  ALBUMIN 3.8   Recent Labs  Lab 10/11/18 2153  LIPASE 33  No results for input(s): AMMONIA in the last 168 hours. Coagulation Profile: No results for input(s): INR, PROTIME in the last 168 hours. Cardiac Enzymes: No results for input(s): CKTOTAL, CKMB, CKMBINDEX, TROPONINI in the last 168 hours. BNP (last 3 results) No results for input(s): PROBNP in the last 8760 hours. HbA1C: No results for input(s): HGBA1C in the last 72 hours. CBG: No results for input(s): GLUCAP in the last 168 hours. Lipid Profile: No results for input(s): CHOL, HDL, LDLCALC, TRIG, CHOLHDL, LDLDIRECT in the last 72 hours. Thyroid Function Tests: No results for input(s): TSH, T4TOTAL, FREET4, T3FREE, THYROIDAB in the last 72 hours. Anemia Panel: No results for input(s): VITAMINB12, FOLATE, FERRITIN, TIBC, IRON, RETICCTPCT in the last 72 hours. Urine analysis:    Component Value Date/Time   COLORURINE YELLOW 10/11/2018 2104   APPEARANCEUR CLOUDY (A) 10/11/2018 2104   LABSPEC >1.030 (H) 10/11/2018 2104   PHURINE 5.5 10/11/2018 2104   GLUCOSEU NEGATIVE 10/11/2018 2104   HGBUR MODERATE (A) 10/11/2018 2104   HGBUR moderate 11/08/2008 1602   BILIRUBINUR SMALL (A) 10/11/2018 2104   BILIRUBINUR moderate 04/28/2017 1601   KETONESUR NEGATIVE 10/11/2018 2104   PROTEINUR 100 (A) 10/11/2018 2104   UROBILINOGEN 0.2 08/11/2017 1647   NITRITE NEGATIVE 10/11/2018 2104   LEUKOCYTESUR SMALL (A) 10/11/2018 2104    Radiological Exams on Admission: Ct Abdomen Pelvis  W Contrast  Result Date: 10/11/2018 CLINICAL DATA:  Patient with right flank pain and worsening fatigue. EXAM: CT ABDOMEN AND PELVIS WITH CONTRAST TECHNIQUE: Multidetector CT imaging of the abdomen and pelvis was performed using the standard protocol following bolus administration of intravenous contrast. CONTRAST:  167m OMNIPAQUE IOHEXOL 300 MG/ML  SOLN COMPARISON:  CT abdomen pelvis 06/26/2018 FINDINGS: Lower chest: Normal heart size. Minimal atelectasis right lower lobe. No pleural effusion. Hepatobiliary: Liver is normal in size and contour. No focal hepatic lesions identified. Prior cholecystectomy. No intrahepatic or extrahepatic biliary ductal dilatation. Pancreas: Unremarkable Spleen: Unremarkable Adrenals/Urinary Tract: Normal adrenal glands. There is patchy hypoenhancement within the mid and superior pole of the left kidney (image 27; series 2). There is left perinephric fat stranding. Mild patchy hypoenhancement within the midpole of the right kidney. No hydronephrosis. Urinary bladder is unremarkable. Stomach/Bowel: Sigmoid colonic diverticulosis. No CT evidence for acute diverticulitis. Small hiatal hernia. Normal morphology of the stomach. No evidence for small bowel obstruction. No free fluid or free intraperitoneal air. Vascular/Lymphatic: Normal caliber abdominal aorta. Peripheral calcified atherosclerotic plaque. No retroperitoneal lymphadenopathy. Reproductive: Uterus is unremarkable. Similar-appearing 4.5 cm left ovarian cyst (image 59; series 2). Other: None. Musculoskeletal: Lumbar spine degenerative changes. No aggressive or acute appearing osseous lesions. IMPRESSION: 1. There is patchy hypoenhancement involving the left kidney with perinephric fat stranding raising the possibility of pyelonephritis. 2. Additionally there is suggestion of mild hypoenhancement within the midpole of the right kidney which may also represent pyelonephritis. 3. There is a 4.5 cm left ovarian cyst. This needs  further evaluation with pelvic ultrasound in the non acute setting. Electronically Signed   By: DLovey NewcomerM.D.   On: 10/11/2018 23:03    Assessment/Plan Active Problems:   Pyelonephritis  Pyelonephritis Acute cystitis without hematuria Patient presenting with progressive bilateral flank pain associated with increased urinary frequency and malodorous urine.  Temperature up to 100.7 with tachycardia and elevated white blood cell count of 20.6.  CT abdomen/pelvis notable for likely perinephric inflammation consistent with pyelonephritis.  No previous urine cultures available for review. --Blood cultures x2: Pending --Urine culture: Pending --Continue ceftriaxone 1 g  IV every 24 hours --Supportive care  Hypokalemia Potassium 3.4.  Will replete --Repeat electrolytes in a.m. to include magnesium  Essential hypertension --Continue diltiazem 360 mg p.o. daily  Hypothyroidism --Check TSH --Continue levothyroxine 150 mcg p.o. daily  History of hyperlipidemia --Reports history of allergic reaction to statins  Ascending aortic aneurysm TTE 04/29/2018 notable for ascending aortic aneurysm 49m.  CT angiogram chest on 05/22/2018 noted stable 4.2 cm ascending aortic aneurysm with no interval change. --Continue outpatient surveillance  Left ovarian cyst CT abdomen/pelvis notable for 4.5 cm left ovarian cyst.  Recommend outpatient pelvic ultrasound.   DVT prophylaxis: Lovenox Code Status: Full code Family Communication: None Disposition Plan: Home when medically ready Consults called: None Admission status: Observation   Breean Nannini J [place]DO [facility] Pager [phone] If 7PM-7AM, please contact night-coverage [email or web address] Password [email or web address]  10/12/2018, 2:43 AM

## 2018-10-12 NOTE — ED Notes (Signed)
Leaving with [facility] at this time. 

## 2018-10-12 NOTE — ED Notes (Signed)
[facility] notified (Kayla [name]) - patient ready for transport 

## 2018-10-13 LAB — CBC
HCT: 41.4 % (ref 36.0–46.0)
Hemoglobin: 12.5 g/dL (ref 12.0–15.0)
MCH: 30 pg (ref 26.0–34.0)
MCHC: 30.2 g/dL (ref 30.0–36.0)
MCV: 99.3 fL (ref 80.0–100.0)
Platelets: 305 10*3/uL (ref 150–400)
RBC: 4.17 MIL/uL (ref 3.87–5.11)
RDW: 12.5 % (ref 11.5–15.5)
WBC: 13.2 10*3/uL — ABNORMAL HIGH (ref 4.0–10.5)
nRBC: 0 % (ref 0.0–0.2)

## 2018-10-13 LAB — BASIC METABOLIC PANEL
Anion gap: 6 (ref 5–15)
BUN: 12 mg/dL (ref 8–23)
CO2: 24 mmol/L (ref 22–32)
Calcium: 9 mg/dL (ref 8.9–10.3)
Chloride: 111 mmol/L (ref 98–111)
Creatinine, Ser: 0.8 mg/dL (ref 0.44–1.00)
GFR calc Af Amer: >60 mL/min (ref >60)
GFR calc non Af Amer: >60 mL/min (ref >60)
Glucose, Bld: 127 mg/dL — ABNORMAL HIGH (ref 70–99)
Potassium: 4 mmol/L (ref 3.5–5.1)
Sodium: 141 mmol/L (ref 135–145)

## 2018-10-13 LAB — MAGNESIUM: Magnesium: 2.3 mg/dL (ref 1.7–2.4)

## 2018-10-13 NOTE — Plan of Care (Signed)
  Problem: Education: Goal: Knowledge of General Education information will improve Description: Including pain rating scale, medication(s)/side effects and non-pharmacologic comfort measures Outcome: Progressing   Problem: Elimination: Goal: Will not experience complications related to bowel motility Outcome: Progressing Goal: Will not experience complications related to urinary retention Outcome: Progressing   Problem: Pain Managment: Goal: General experience of comfort will improve Outcome: Progressing   Problem: Safety: Goal: Ability to remain free from injury will improve Outcome: Progressing   Problem: Skin Integrity: Goal: Risk for impaired skin integrity will decrease Outcome: Progressing   

## 2018-10-13 NOTE — Progress Notes (Signed)
PROGRESS NOTE  Kayla [name] QVZ:[id number] DOB: Oct 10, 1945 DOA: 10/11/2018 PCP: [name], NP  HPI/Recap of past 39 hours:  73 year old female with PMH of fibromyalgia, GERD, HLD, HTN, hypothyroid, anxiety and depression and ascending aortic aneurysm transferred from [place] ED due to flank pain, urinary frequency, malodorous urine, fatigue and subjective fevers.  Admitted for sepsis due to acute pyelonephritis.  10/13/18: Patient seen and examined at her bedside.  Reports flank pain is improved with pain medications.  She denies dysuria, nausea or abdominal pain.    Assessment/Plan: Active Problems:   Pyelonephritis   Acute pyelonephritis  Sepsis secondary to acute bilateral pyelonephritis Presented with leukocytosis, febrile 102.5, tachycardia, tachypnea positive UA Leukocytosis is trending down from 19 K to 13K. Afebrile Continue IV antibiotics Rocephin Urine culture positive for E. coli and Staphylococcus Lugdunensis greater than 100,000 colonies each Awaiting sensitivities result Blood cultures x2 no growth times less than 24 Monitor fever curve and WBC  Hypothyroidism Continue levothyroxine  Hypertension Continue diltiazem p.o.  Hypokalemia, resolved post repletion  Ascending aortic aneurysm TTE 04/29/2018 notable for ascending aortic aneurysm 43 mm.  CTA chest on 05/22/2018 noted stable 4.2 cm ascending aortic aneurysm with no interval change.  Continue outpatient surveillance.  Left ovarian cyst CT abdomen and pelvis notable for 4.5 cm left ovarian cyst: Recommend outpatient pelvic ultrasound and follow-up.   DVT prophylaxis: Lovenox Code Status: Full Family Communication: None at bedside Disposition:  DC home possibly tomorrow 10/14/2018 when clinically improved, when fever and leukocytosis have resolved.   Consultants:  None  Procedures:  None  Antimicrobials:  IV ceftriaxone  Objective: Vitals:   10/12/18 1326 10/12/18 2200  10/13/18 0500 10/13/18 0533  BP: (!) 136/92 (!) 151/84  (!) 156/93  Pulse: 78 77  70  Resp: 20 20  20   Temp: 98.7 F (37.1 C) 98.7 F (37.1 C)  98.8 F (37.1 C)  TempSrc: Axillary Oral  Oral  SpO2: 95% 98%  95%  Weight:   99.5 kg   Height:   5\' 5"  (1.651 m)     Intake/Output Summary (Last 24 hours) at 10/13/2018 1122 Last data filed at 10/13/2018 0424 Gross per 24 hour  Intake 1090 ml  Output 2000 ml  Net -910 ml   Filed Weights   10/13/18 0500  Weight: 99.5 kg    Exam:  . General: 73 y.o. year-old female well developed well nourished in no acute distress.  Alert and oriented x3. . Cardiovascular: Regular rate and rhythm with no rubs or gallops.  No thyromegaly or JVD noted.   Marland Kitchen Respiratory: Clear to auscultation with no wheezes or rales. Good inspiratory effort. . Abdomen: Soft nontender nondistended with normal bowel sounds x4 quadrants. . Musculoskeletal: Trace lower extremity edema. 2/4 pulses in all 4 extremities. Marland Kitchen Psychiatry: Mood is appropriate for condition and setting   Data Reviewed: CBC: Recent Labs  Lab 10/11/18 2153 10/12/18 0449 10/13/18 0427  WBC 20.6* 19.7* 13.2*  NEUTROABS 16.1*  --   --   HGB 14.5 13.8 12.5  HCT 45.6 43.5 41.4  MCV 96.6 98.6 99.3  PLT 319 313 329   Basic Metabolic Panel: Recent Labs  Lab 10/11/18 2153 10/12/18 0449 10/13/18 0427  NA 139  --  141  K 3.4*  --  4.0  CL 107  --  111  CO2 20*  --  24  GLUCOSE 142*  --  127*  BUN 15  --  12  CREATININE 1.01* 0.86  0.80  CALCIUM 9.2  --  9.0  MG  --   --  2.3   GFR: Estimated Creatinine Clearance: 73.2 mL/min (by C-G formula based on SCr of 0.8 mg/dL). Liver Function Tests: Recent Labs  Lab 10/11/18 2153  AST 14*  ALT 13  ALKPHOS 82  BILITOT 0.9  PROT 7.7  ALBUMIN 3.8   Recent Labs  Lab 10/11/18 2153  LIPASE 33   No results for input(s): AMMONIA in the last 168 hours. Coagulation Profile: Recent Labs  Lab 10/12/18 0449  INR 1.1   Cardiac Enzymes: No  results for input(s): CKTOTAL, CKMB, CKMBINDEX, TROPONINI in the last 168 hours. BNP (last 3 results) No results for input(s): PROBNP in the last 8760 hours. HbA1C: No results for input(s): HGBA1C in the last 72 hours. CBG: No results for input(s): GLUCAP in the last 168 hours. Lipid Profile: No results for input(s): CHOL, HDL, LDLCALC, TRIG, CHOLHDL, LDLDIRECT in the last 72 hours. Thyroid Function Tests: Recent Labs    10/12/18 0449  TSH 0.075*   Anemia Panel: No results for input(s): VITAMINB12, FOLATE, FERRITIN, TIBC, IRON, RETICCTPCT in the last 72 hours. Urine analysis:    Component Value Date/Time   COLORURINE YELLOW 10/11/2018 2104   APPEARANCEUR CLOUDY (A) 10/11/2018 2104   LABSPEC >1.030 (H) 10/11/2018 2104   PHURINE 5.5 10/11/2018 2104   GLUCOSEU NEGATIVE 10/11/2018 2104   HGBUR MODERATE (A) 10/11/2018 2104   HGBUR moderate 11/08/2008 1602   BILIRUBINUR SMALL (A) 10/11/2018 2104   BILIRUBINUR moderate 04/28/2017 1601   KETONESUR NEGATIVE 10/11/2018 2104   PROTEINUR 100 (A) 10/11/2018 2104   UROBILINOGEN 0.2 08/11/2017 1647   NITRITE NEGATIVE 10/11/2018 2104   LEUKOCYTESUR SMALL (A) 10/11/2018 2104   Sepsis Labs: @LABRCNTIP (procalcitonin:4,lacticidven:4)  ) Recent Results (from the past 240 hour(s))  Urine culture     Status: Abnormal (Preliminary result)   Collection Time: 10/11/18  9:04 PM   Specimen: Urine, Random  Result Value Ref Range Status   Specimen Description   Final    URINE, RANDOM Performed at [facility], [address] [place], [place] [address]    Special Requests   Final    NONE Performed at [facility], [address] [place], [place] [address]    Culture (A)  Final    >=100,000 COLONIES/mL ESCHERICHIA COLI >=100,000 COLONIES/mL STAPHYLOCOCCUS LUGDUNENSIS    Report Status PENDING  Incomplete  Culture, blood (Routine X 2) w Reflex to ID Panel     Status: None (Preliminary result)   Collection Time:  10/11/18  9:53 PM   Specimen: BLOOD  Result Value Ref Range Status   Specimen Description   Final    BLOOD LEFT ANTECUBITAL Performed at [facility], [address] [place], [place] [address]    Special Requests   Final    BOTTLES DRAWN AEROBIC AND ANAEROBIC Blood Culture adequate volume Performed at [facility], [address] [place], [place] [address]    Culture   Final    NO GROWTH < 24 HOURS Performed at [facility], [place] [address], [place], [place] [address]    Report Status PENDING  Incomplete  Culture, blood (Routine X 2) w Reflex to ID Panel     Status: None (Preliminary result)   Collection Time: 10/11/18  9:55 PM   Specimen: BLOOD LEFT HAND  Result Value Ref Range Status   Specimen Description   Final  BLOOD LEFT HAND Performed at [facility] [address] [place], [place] [address]    Special Requests   Final    BOTTLES DRAWN AEROBIC AND ANAEROBIC Blood Culture adequate volume Performed at [facility], [address] [place], [place] [address]    Culture   Final    NO GROWTH < 24 HOURS Performed at [facility], [place] [address], [place], [place] [address]    Report Status PENDING  Incomplete  SARS Coronavirus 2 (Hosp order,Performed in [facility] lab via [facility] ID)     Status: None   Collection Time: 10/11/18 11:26 PM   Specimen: Dry Nasal Swab ([facility]ott ID Now)  Result Value Ref Range Status   SARS Coronavirus 2 ([facility]ott ID Now) NEGATIVE NEGATIVE Final    Comment: (NOTE) SARS-CoV-2 target nucleic acids are NOT DETECTED. The SARS-CoV-2 RNA is generally detectable in upper and lower respiratory specimens during the acute phase of infection.  Negativeresults do not preclude SARS-CoV-2 infection, do not rule out coinfections with other pathogens, and should not be used as the  sole basis for treatment or other patient management decisions.  Negative results must be combined with clinical  observations, patient history, and epidemiological information. The expected result is Negative. Fact Sheet for Patients: GolfingFamily.no Fact Sheet for Healthcare Providers: https://www.hernandez-brewer.com/ This test is not yet approved or cleared by the [place] FDA and  has been authorized for detection and/or diagnosis of SARS-CoV-2 by FDA under an Emergency Use Authorization (EUA).  This EUA will remain in effect (meaning this test can be used) for the duration of  the COVID19 declaration under Section 5 64(b)(1) of the Act, 21 U.S.C.  section [phone] 3(b)(1), unless the authorization is terminated or revoked sooner. Performed at [facility], [address] [place], [place] [address]   SARS Coronavirus 2 (CEPHEID - Performed in [facility] hospital lab), Hosp Order     Status: None   Collection Time: 10/12/18  2:43 AM   Specimen: Nasopharyngeal Swab  Result Value Ref Range Status   SARS Coronavirus 2 NEGATIVE NEGATIVE Final    Comment: (NOTE) If result is NEGATIVE SARS-CoV-2 target nucleic acids are NOT DETECTED. The SARS-CoV-2 RNA is generally detectable in upper and lower  respiratory specimens during the acute phase of infection. The lowest  concentration of SARS-CoV-2 viral copies this assay can detect is 250  copies / mL. A negative result does not preclude SARS-CoV-2 infection  and should not be used as the sole basis for treatment or other  patient management decisions.  A negative result may occur with  improper specimen collection / handling, submission of specimen other  than nasopharyngeal swab, presence of viral mutation(s) within the  areas targeted by this assay, and inadequate number of viral copies  (<250 copies / mL). A negative result must be combined with clinical  observations, patient history, and epidemiological information. If result is POSITIVE SARS-CoV-2 target nucleic acids are DETECTED. The  SARS-CoV-2 RNA is generally detectable in upper and lower  respiratory specimens dur ing the acute phase of infection.  Positive  results are indicative of active infection with SARS-CoV-2.  Clinical  correlation with patient history and other diagnostic information is  necessary to determine patient infection status.  Positive results do  not rule out bacterial infection or co-infection with other viruses. If result is PRESUMPTIVE POSTIVE SARS-CoV-2 nucleic acids MAY BE PRESENT.   A presumptive positive result was  obtained on the submitted specimen  and confirmed on repeat testing.  While 2019 novel coronavirus  (SARS-CoV-2) nucleic acids may be present in the submitted sample  additional confirmatory testing may be necessary for epidemiological  and / or clinical management purposes  to differentiate between  SARS-CoV-2 and other Sarbecovirus currently known to infect humans.  If clinically indicated additional testing with an alternate test  methodology [phone]) is advised. The SARS-CoV-2 RNA is generally  detectable in upper and lower respiratory sp ecimens during the acute  phase of infection. The expected result is Negative. Fact Sheet for Patients:  StrictlyIdeas.no Fact Sheet for Healthcare Providers: BankingDealers.co.za This test is not yet approved or cleared by the [place] FDA and has been authorized for detection and/or diagnosis of SARS-CoV-2 by FDA under an Emergency Use Authorization (EUA).  This EUA will remain in effect (meaning this test can be used) for the duration of the COVID-19 declaration under Section 564(b)(1) of the Act, 21 U.S.C. section 360bbb-3(b)(1), unless the authorization is terminated or revoked sooner. Performed at [facility], [place] [address], [place], [place] [address]       Studies: No results found.  Scheduled Meds: . diltiazem  360 mg Oral Daily  . enoxaparin  (LOVENOX) injection  40 mg Subcutaneous Q24H  . levothyroxine  150 mcg Oral Q0600    Continuous Infusions: . cefTRIAXone (ROCEPHIN)  IV 1 g (10/12/18 2233)     LOS: 1 day     Kayleen Memos, MD [facility] Pager [phone]  If 7PM-7AM, please contact night-coverage [email or web address] Password [email or web address] 10/13/2018, 11:22 AM

## 2018-10-14 LAB — CBC WITH DIFFERENTIAL/PLATELET
Abs Immature Granulocytes: 0.03 10*3/uL (ref 0.00–0.07)
Basophils Absolute: 0.1 10*3/uL (ref 0.0–0.1)
Basophils Relative: 0 %
Eosinophils Absolute: 0.1 10*3/uL (ref 0.0–0.5)
Eosinophils Relative: 1 %
HCT: 42.1 % (ref 36.0–46.0)
Hemoglobin: 13.1 g/dL (ref 12.0–15.0)
Immature Granulocytes: 0 %
Lymphocytes Relative: 28 %
Lymphs Abs: 3.3 10*3/uL (ref 0.7–4.0)
MCH: 30.5 pg (ref 26.0–34.0)
MCHC: 31.1 g/dL (ref 30.0–36.0)
MCV: 98.1 fL (ref 80.0–100.0)
Monocytes Absolute: 1 10*3/uL (ref 0.1–1.0)
Monocytes Relative: 8 %
Neutro Abs: 7.6 10*3/uL (ref 1.7–7.7)
Neutrophils Relative %: 63 %
Platelets: 355 10*3/uL (ref 150–400)
RBC: 4.29 MIL/uL (ref 3.87–5.11)
RDW: 12.5 % (ref 11.5–15.5)
WBC: 12.1 10*3/uL — ABNORMAL HIGH (ref 4.0–10.5)
nRBC: 0 % (ref 0.0–0.2)

## 2018-10-14 LAB — URINE CULTURE: Culture: >=100000 — AB

## 2018-10-14 MED ORDER — CEPHALEXIN 500 MG PO CAPS
500.0000 mg | ORAL_CAPSULE | Freq: Three times a day (TID) | ORAL | Status: DC
  Administered 2018-10-14 – 2018-10-15 (×3): 500 mg via ORAL
  Filled 2018-10-14 (×3): qty 1

## 2018-10-14 NOTE — Evaluation (Signed)
Physical Therapy Evaluation-1x Patient Details Name: Kayla [name] MRN: [id number] DOB: 01-Feb-1946 Today's Date: 10/14/2018   History of Present Illness  73 yo female admitted with pyelonephritis, sepsis. Hx of fibromyalgia, OA, AAA, anxiety, depression, asthma  Clinical Impression  On eval, pt was Mod Ind with mobility. She walked around her room without difficulty. No PT needs. 1x eval. Will sign off.     Follow Up Recommendations No PT follow up    Equipment Recommendations  None recommended by PT    Recommendations for Other Services       Precautions / Restrictions Precautions Precautions: Fall Restrictions Weight Bearing Restrictions: No      Mobility  Bed Mobility Overal bed mobility: Modified Independent                Transfers Overall transfer level: Modified independent                  Ambulation/Gait Ambulation/Gait assistance: Modified independent (Device/Increase time) Gait Distance (Feet): 60 Feet Assistive device: None       General Gait Details: walked in room. no lob.  Stairs            Wheelchair Mobility    Modified Rankin (Stroke Patients Only)       Balance Overall balance assessment: No apparent balance deficits (not formally assessed)                                           Pertinent Vitals/Pain Pain Assessment: Faces Faces Pain Scale: No hurt    Home Living Family/patient expects to be discharged to:: Private residence Living Arrangements: Other relatives;Children   Type of Home: House                Prior Function Level of Independence: Independent               Hand Dominance        Extremity/Trunk Assessment   Upper Extremity Assessment Upper Extremity Assessment: Overall WFL for tasks assessed    Lower Extremity Assessment Lower Extremity Assessment: Overall WFL for tasks assessed    Cervical / Trunk Assessment Cervical / Trunk Assessment: Normal   Communication   Communication: HOH  Cognition Arousal/Alertness: Awake/alert Behavior During Therapy: WFL for tasks assessed/performed Overall Cognitive Status: Within Functional Limits for tasks assessed                                        General Comments      Exercises     Assessment/Plan    PT Assessment Patent does not need any further PT services  PT Problem List         PT Treatment Interventions      PT Goals (Current goals can be found in the Care Plan section)  Acute Rehab PT Goals Patient Stated Goal: none stated PT Goal Formulation: All assessment and education complete, DC therapy    Frequency     Barriers to discharge        Co-evaluation               AM-PAC PT "6 Clicks" Mobility  Outcome Measure Help needed turning from your back to your side while in a flat bed without using bedrails?: None Help needed moving from lying on  your back to sitting on the side of a flat bed without using bedrails?: None Help needed moving to and from a bed to a chair (including a wheelchair)?: None Help needed standing up from a chair using your arms (e.g., wheelchair or bedside chair)?: None Help needed to walk in hospital room?: None Help needed climbing 3-5 steps with a railing? : None 6 Click Score: 24    End of Session   Activity Tolerance: Patient tolerated treatment well Patient left: in bed;with call bell/phone within reach        Time: 1620-1650 PT Time Calculation (min) (ACUTE ONLY): 30 min   Charges:   PT Evaluation $PT Eval Low Complexity: [place], PT Acute Rehabilitation Services Pager: [phone] Office: [phone]

## 2018-10-14 NOTE — Progress Notes (Signed)
PROGRESS NOTE  Kayla [name] KGU:[id number] DOB: February 22, 1946 DOA: 10/11/2018 PCP: [name], NP  HPI/Recap of past 16 hours:  73 year old female with PMH of fibromyalgia, GERD, HLD, HTN, hypothyroid, anxiety and depression and ascending aortic aneurysm transferred from [place] ED due to flank pain, urinary frequency, malodorous urine, fatigue and subjective fevers.  Admitted for sepsis due to acute bilateral pyelonephritis.  10/14/18: Patient was seen and examined at her bedside.  There were no acute events overnight.  Urine culture grew greater than 100,000 colonies of E. coli and greater than 100,000 colonies of staph Lugdunesis.  Awaiting results of sensitivities.   Assessment/Plan: Active Problems:   Pyelonephritis   Acute pyelonephritis  Sepsis secondary to acute bilateral pyelonephritis Presented with leukocytosis, febrile 102.5, tachycardia, tachypnea positive UA Leukocytosis is trending down from 19 K to 13K to 12K. Urine culture grew greater than 100,000 colonies of E. coli and greater than 100,000 colonies of Staphylococcus Lugdunensis.  Sensitivities showing resistance to ampicillin and ampicillin/sulbactam Completed 3 days of Rocephin Switch to Keflex 500 mg TID x7 days on 10/14/2018 Blood cultures x 2 negative to date drawn on 10/11/18  Hypothyroidism Continue levothyroxine  Hypertension Continue diltiazem p.o.  Hypokalemia, resolved post repletion  Ascending aortic aneurysm TTE 04/29/2018 notable for ascending aortic aneurysm 43 mm.  CTA chest on 05/22/2018 noted stable 4.2 cm ascending aortic aneurysm with no interval change.  Continue outpatient surveillance.  Left ovarian cyst CT abdomen and pelvis notable for 4.5 cm left ovarian cyst: Recommend outpatient pelvic ultrasound and follow-up.  Hearing impairment Follow-up outpatient with PCP for possible hearing aids   DVT prophylaxis: Lovenox Code Status: Full Family Communication: None at  bedside Disposition:  DC home possibly tomorrow 10/14/2018 when clinically improved, when fever and leukocytosis have resolved.   Consultants:  None  Procedures:  None  Antimicrobials:  IV ceftriaxone  Objective: Vitals:   10/13/18 0533 10/13/18 1332 10/13/18 2053 10/14/18 0538  BP: (!) 156/93 (!) 122/102 (!) 141/82 (!) 105/93  Pulse: 70 76 68 68  Resp: 20 20 18 16   Temp: 98.8 F (37.1 C) 97.8 F (36.6 C) 98.3 F (36.8 C) 98.4 F (36.9 C)  TempSrc: Oral Axillary Oral Oral  SpO2: 95% 97% 98% 96%  Weight:      Height:        Intake/Output Summary (Last 24 hours) at 10/14/2018 0951 Last data filed at 10/14/2018 0800 Gross per 24 hour  Intake 100 ml  Output --  Net 100 ml   Filed Weights   10/13/18 0500  Weight: 99.5 kg    Exam:   General: 73 y.o. year-old female well-developed well-nourished no acute distress.  Alert and oriented x3.  Very hard of hearing with hearing impairment.  Cardiovascular: Regular rate and rhythm with no rubs or gallops.  No JVD or thyromegaly noted.    Respiratory: Clear to auscultation with no wheezes or rales.  Good respiratory effort.  Abdomen: Soft nontender nondistended with normal bowel sounds x4 quadrants.  Musculoskeletal: Trace lower extremity edema bilaterally.  2 out of 4 pulses in all 4 extremities  Psychiatry: Mood is appropriate for condition and setting.   Data Reviewed: CBC: Recent Labs  Lab 10/11/18 2153 10/12/18 0449 10/13/18 0427 10/14/18 0510  WBC 20.6* 19.7* 13.2* 12.1*  NEUTROABS 16.1*  --   --  7.6  HGB 14.5 13.8 12.5 13.1  HCT 45.6 43.5 41.4 42.1  MCV 96.6 98.6 99.3 98.1  PLT 319 313 305 355  Basic Metabolic Panel: Recent Labs  Lab 10/11/18 2153 10/12/18 0449 10/13/18 0427  NA 139  --  141  K 3.4*  --  4.0  CL 107  --  111  CO2 20*  --  24  GLUCOSE 142*  --  127*  BUN 15  --  12  CREATININE 1.01* 0.86 0.80  CALCIUM 9.2  --  9.0  MG  --   --  2.3   GFR: Estimated Creatinine Clearance:  73.2 mL/min (by C-G formula based on SCr of 0.8 mg/dL). Liver Function Tests: Recent Labs  Lab 10/11/18 2153  AST 14*  ALT 13  ALKPHOS 82  BILITOT 0.9  PROT 7.7  ALBUMIN 3.8   Recent Labs  Lab 10/11/18 2153  LIPASE 33   No results for input(s): AMMONIA in the last 168 hours. Coagulation Profile: Recent Labs  Lab 10/12/18 0449  INR 1.1   Cardiac Enzymes: No results for input(s): CKTOTAL, CKMB, CKMBINDEX, TROPONINI in the last 168 hours. BNP (last 3 results) No results for input(s): PROBNP in the last 8760 hours. HbA1C: No results for input(s): HGBA1C in the last 72 hours. CBG: No results for input(s): GLUCAP in the last 168 hours. Lipid Profile: No results for input(s): CHOL, HDL, LDLCALC, TRIG, CHOLHDL, LDLDIRECT in the last 72 hours. Thyroid Function Tests: Recent Labs    10/12/18 0449  TSH 0.075*   Anemia Panel: No results for input(s): VITAMINB12, FOLATE, FERRITIN, TIBC, IRON, RETICCTPCT in the last 72 hours. Urine analysis:    Component Value Date/Time   COLORURINE YELLOW 10/11/2018 2104   APPEARANCEUR CLOUDY (A) 10/11/2018 2104   LABSPEC >1.030 (H) 10/11/2018 2104   PHURINE 5.5 10/11/2018 2104   GLUCOSEU NEGATIVE 10/11/2018 2104   HGBUR MODERATE (A) 10/11/2018 2104   HGBUR moderate 11/08/2008 1602   BILIRUBINUR SMALL (A) 10/11/2018 2104   BILIRUBINUR moderate 04/28/2017 1601   KETONESUR NEGATIVE 10/11/2018 2104   PROTEINUR 100 (A) 10/11/2018 2104   UROBILINOGEN 0.2 08/11/2017 1647   NITRITE NEGATIVE 10/11/2018 2104   LEUKOCYTESUR SMALL (A) 10/11/2018 2104   Sepsis Labs: @LABRCNTIP (procalcitonin:4,lacticidven:4)  ) Recent Results (from the past 240 hour(s))  Urine culture     Status: Abnormal   Collection Time: 10/11/18  9:04 PM   Specimen: Urine, Random  Result Value Ref Range Status   Specimen Description   Final    URINE, RANDOM Performed at [facility], [address] [place], [place] [address]    Special Requests   Final     NONE Performed at [facility] [address] [place], [place] [address]    Culture (A)  Final    >=100,000 COLONIES/mL ESCHERICHIA COLI >=100,000 COLONIES/mL STAPHYLOCOCCUS LUGDUNENSIS    Report Status 10/14/2018 FINAL  Final   Organism ID, Bacteria ESCHERICHIA COLI (A)  Final   Organism ID, Bacteria STAPHYLOCOCCUS LUGDUNENSIS (A)  Final      Susceptibility   Escherichia coli - MIC*    AMPICILLIN >=32 RESISTANT Resistant     CEFAZOLIN <=4 SENSITIVE Sensitive     CEFTRIAXONE <=1 SENSITIVE Sensitive     CIPROFLOXACIN <=0.25 SENSITIVE Sensitive     GENTAMICIN <=1 SENSITIVE Sensitive     IMIPENEM <=0.25 SENSITIVE Sensitive     NITROFURANTOIN <=16 SENSITIVE Sensitive     TRIMETH/SULFA <=20 SENSITIVE Sensitive     AMPICILLIN/SULBACTAM >=32 RESISTANT Resistant     PIP/TAZO <=4 SENSITIVE Sensitive     Extended ESBL NEGATIVE Sensitive     * >=  100,000 COLONIES/mL ESCHERICHIA COLI   Staphylococcus lugdunensis - MIC*    CIPROFLOXACIN <=0.5 SENSITIVE Sensitive     GENTAMICIN <=0.5 SENSITIVE Sensitive     NITROFURANTOIN <=16 SENSITIVE Sensitive     OXACILLIN 2 SENSITIVE Sensitive     TETRACYCLINE <=1 SENSITIVE Sensitive     VANCOMYCIN <=0.5 SENSITIVE Sensitive     TRIMETH/SULFA <=10 SENSITIVE Sensitive     CLINDAMYCIN <=0.25 SENSITIVE Sensitive     RIFAMPIN <=0.5 SENSITIVE Sensitive     Inducible Clindamycin NEGATIVE Sensitive     * >=100,000 COLONIES/mL STAPHYLOCOCCUS LUGDUNENSIS  Culture, blood (Routine X 2) w Reflex to ID Panel     Status: None (Preliminary result)   Collection Time: 10/11/18  9:53 PM   Specimen: BLOOD  Result Value Ref Range Status   Specimen Description   Final    BLOOD LEFT ANTECUBITAL Performed at [facility], [address] [place], [place] [address]    Special Requests   Final    BOTTLES DRAWN AEROBIC AND ANAEROBIC Blood Culture adequate volume Performed at [facility], [address] [place], [place] [address]     Culture   Final    NO GROWTH < 24 HOURS Performed at [facility], [place] [address], [place], [place] [address]    Report Status PENDING  Incomplete  Culture, blood (Routine X 2) w Reflex to ID Panel     Status: None (Preliminary result)   Collection Time: 10/11/18  9:55 PM   Specimen: BLOOD LEFT HAND  Result Value Ref Range Status   Specimen Description   Final    BLOOD LEFT HAND Performed at [facility], [address] [place], [place] [address]    Special Requests   Final    BOTTLES DRAWN AEROBIC AND ANAEROBIC Blood Culture adequate volume Performed at [facility], [address] [place], [place] [address]    Culture   Final    NO GROWTH < 24 HOURS Performed at [facility], [place] [address], [place], [place] [address]    Report Status PENDING  Incomplete  SARS Coronavirus 2 (Hosp order,Performed in [facility] lab via [employer] ID)     Status: None   Collection Time: 10/11/18 11:26 PM   Specimen: Dry Nasal Swab ([employer] ID Now)  Result Value Ref Range Status   SARS Coronavirus 2 ([employer] ID Now) NEGATIVE NEGATIVE Final    Comment: (NOTE) SARS-CoV-2 target nucleic acids are NOT DETECTED. The SARS-CoV-2 RNA is generally detectable in upper and lower respiratory specimens during the acute phase of infection.  Negativeresults do not preclude SARS-CoV-2 infection, do not rule out coinfections with other pathogens, and should not be used as the  sole basis for treatment or other patient management decisions.  Negative results must be combined with clinical observations, patient history, and epidemiological information. The expected result is Negative. Fact Sheet for Patients: GolfingFamily.no Fact Sheet for Healthcare Providers: https://www.hernandez-brewer.com/ This test is not yet approved or cleared by the [place] FDA and  has been authorized for detection and/or diagnosis of SARS-CoV-2 by FDA under an  Emergency Use Authorization (EUA).  This EUA will remain in effect (meaning this test can be used) for the duration of  the COVID19 declaration under Section 5 64(b)(1) of the Act, 21 U.S.C.  section 6064450616 3(b)(1), unless the authorization is terminated or revoked sooner. Performed at [facility], [address] [place],  [place] [address]   SARS Coronavirus 2 (CEPHEID - Performed in [place] hospital lab), Hosp Order     Status: None   Collection Time: 10/12/18  2:43 AM   Specimen: Nasopharyngeal Swab  Result Value Ref Range Status   SARS Coronavirus 2 NEGATIVE NEGATIVE Final    Comment: (NOTE) If result is NEGATIVE SARS-CoV-2 target nucleic acids are NOT DETECTED. The SARS-CoV-2 RNA is generally detectable in upper and lower  respiratory specimens during the acute phase of infection. The lowest  concentration of SARS-CoV-2 viral copies this assay can detect is 250  copies / mL. A negative result does not preclude SARS-CoV-2 infection  and should not be used as the sole basis for treatment or other  patient management decisions.  A negative result may occur with  improper specimen collection / handling, submission of specimen other  than nasopharyngeal swab, presence of viral mutation(s) within the  areas targeted by this assay, and inadequate number of viral copies  (<250 copies / mL). A negative result must be combined with clinical  observations, patient history, and epidemiological information. If result is POSITIVE SARS-CoV-2 target nucleic acids are DETECTED. The SARS-CoV-2 RNA is generally detectable in upper and lower  respiratory specimens dur ing the acute phase of infection.  Positive  results are indicative of active infection with SARS-CoV-2.  Clinical  correlation with patient history and other diagnostic information is  necessary to determine patient infection status.  Positive results do  not rule out bacterial infection or co-infection with other  viruses. If result is PRESUMPTIVE POSTIVE SARS-CoV-2 nucleic acids MAY BE PRESENT.   A presumptive positive result was obtained on the submitted specimen  and confirmed on repeat testing.  While 2019 novel coronavirus  (SARS-CoV-2) nucleic acids may be present in the submitted sample  additional confirmatory testing may be necessary for epidemiological  and / or clinical management purposes  to differentiate between  SARS-CoV-2 and other Sarbecovirus currently known to infect humans.  If clinically indicated additional testing with an alternate test  methodology [phone]) is advised. The SARS-CoV-2 RNA is generally  detectable in upper and lower respiratory sp ecimens during the acute  phase of infection. The expected result is Negative. Fact Sheet for Patients:  StrictlyIdeas.no Fact Sheet for Healthcare Providers: BankingDealers.co.za This test is not yet approved or cleared by the [place] FDA and has been authorized for detection and/or diagnosis of SARS-CoV-2 by FDA under an Emergency Use Authorization (EUA).  This EUA will remain in effect (meaning this test can be used) for the duration of the COVID-19 declaration under Section 564(b)(1) of the Act, 21 U.S.C. section 360bbb-3(b)(1), unless the authorization is terminated or revoked sooner. Performed at [facility], [place] [address], [place], [place] [address]       Studies: No results found.  Scheduled Meds:  diltiazem  360 mg Oral Daily   enoxaparin (LOVENOX) injection  40 mg Subcutaneous Q24H   levothyroxine  150 mcg Oral Q0600    Continuous Infusions:  cefTRIAXone (ROCEPHIN)  IV Stopped (10/13/18 2153)     LOS: 2 days     Kayleen Memos, MD [facility] Pager [phone]  If 7PM-7AM, please contact night-coverage [email or web address] Password [facility] 10/14/2018, 9:51 AM

## 2018-10-15 LAB — CBC
HCT: 41 % (ref 36.0–46.0)
Hemoglobin: 13 g/dL (ref 12.0–15.0)
MCH: 31 pg (ref 26.0–34.0)
MCHC: 31.7 g/dL (ref 30.0–36.0)
MCV: 97.9 fL (ref 80.0–100.0)
Platelets: 370 10*3/uL (ref 150–400)
RBC: 4.19 MIL/uL (ref 3.87–5.11)
RDW: 12.3 % (ref 11.5–15.5)
WBC: 9.9 10*3/uL (ref 4.0–10.5)
nRBC: 0 % (ref 0.0–0.2)

## 2018-10-15 MED ORDER — CEPHALEXIN 500 MG PO CAPS: 500.0000 mg | ORAL_CAPSULE | Freq: Three times a day (TID) | ORAL | 0 refills | Status: AC

## 2018-10-15 NOTE — Discharge Summary (Signed)
Discharge Summary  Kayla [name] KZL:[id number] DOB: 29-Mar-1946  PCP: Mina Marble D, NP  Admit date: 10/11/2018 Discharge date: 10/15/2018  Time spent: 35 minutes  Recommendations for Outpatient Follow-up:  1. Follow-up with your primary care provider 2. Follow-up with your cardiologist 3. Take your medications as prescribed  Discharge Diagnoses:  Active Hospital Problems   Diagnosis Date Noted  . Pyelonephritis 10/12/2018  . Acute pyelonephritis 10/12/2018    Resolved Hospital Problems  No resolved problems to display.    Discharge Condition: Stable  Diet recommendation: Resume previous diet.  Vitals:   10/14/18 2140 10/15/18 0514  BP: (!) 146/81 138/80  Pulse: 65 65  Resp: 18 18  Temp: 98.3 F (36.8 C) 97.9 F (36.6 C)  SpO2: 98% 99%    History of present illness:  HPI/  73 year old female with PMH of fibromyalgia, GERD, HLD, HTN, hypothyroidism, anxiety and depression and ascending aortic aneurysm [place] [facility] due to flank pain, urinary frequency, malodorous urine, fatigue and subjective fevers. Admitted for sepsis due to acute bilateral pyelonephritis.  Urine culture grew greater than 100,000 colonies of E. coli and greater than 100,000 colonies of staph Lugdunesis. Sensitivities showing resistance to ampicillin and ampicillin/sulbactam.  10/15/18: Patient seen and examined at her bedside.  No acute events overnight.  States she feels better.  No new complaints.  On the day of discharge, the patient was hemodynamically stable.  She will need to follow-up with her primary care provider and cardiologist posthospitalization.  She was evaluated by physical therapy, no PT follow-up recommended.  Hospital Course:  Active Problems:   Pyelonephritis   Acute pyelonephritis  Sepsis secondary to acute bilateral pyelonephritis Presented with leukocytosis, febrile 102.5, tachycardia, tachypnea positive UA Leukocytosis has resolved, WBC 9.9  from 12.1 from 13.2 from 19.7 from 20.6 on admission.  Afebrile, vital signs on 10/15/2018 reviewed and are stable. Urine culture grew greater than 100,000 colonies of E. coli and greater than 100,000 colonies of Staphylococcus Lugdunensis.  Sensitivities showing resistance to ampicillin and ampicillin/sulbactam Completed 3 days of IV Rocephin Switch to Keflex 500 mg TID on 10/14/2018 Blood cultures x 2 negative to date (drawn on 10/11/18)  Hypothyroidism Continue levothyroxine Follow-up with your PCP  Hypertension Blood pressure is normotensive Continue diltiazem p.o. Follow-up with your PCP  Hypokalemia, resolved post repletion  Ascending aortic aneurysm TTE 04/29/2018 notable for ascending aortic aneurysm 43 mm. CTA chest on 05/22/2018 noted stable 4.2 cm ascending aortic aneurysm with no interval change.  Continue outpatient surveillance. Follow-up with your cardiologist  Left ovarian cyst CT abdomen and pelvis notable for 4.5 cm left ovarian cyst: Recommend outpatient pelvic ultrasound and follow-up. Follow-up with your PCP  Hearing impairment Follow-up outpatient with PCP for possible hearing aids    Code Status:Full    Consultants: None  Procedures: None  Antimicrobials: IV ceftriaxone 10/11/18>>10/14/18. Keflex 10/14/18>>   Discharge Exam: BP 138/80 (BP Location: Left Arm)   Pulse 65   Temp 97.9 F (36.6 C) (Oral)   Resp 18   Ht 5' 5"  (1.651 m)   Wt 99.5 kg   SpO2 99%   BMI 36.50 kg/m  . General: 73 y.o. year-old female well developed well nourished in no acute distress.  Alert and oriented x3. . Cardiovascular: Regular rate and rhythm with no rubs or gallops.  No thyromegaly or JVD noted.   Marland Kitchen Respiratory: Clear to auscultation with no wheezes or rales. Good inspiratory effort. . Abdomen: Soft nontender nondistended with normal bowel sounds  x4 quadrants. . Musculoskeletal: No lower extremity edema. 2/4 pulses in all 4 extremities. Marland Kitchen  Psychiatry: Mood is appropriate for condition and setting  Discharge Instructions You were cared for by a hospitalist during your hospital stay. If you have any questions about your discharge medications or the care you received while you were in the hospital after you are discharged, you can call the unit and asked to speak with the hospitalist on call if the hospitalist that took care of you is not available. Once you are discharged, your primary care physician will handle any further medical issues. Please note that NO REFILLS for any discharge medications will be authorized once you are discharged, as it is imperative that you return to your primary care physician (or establish a relationship with a primary care physician if you do not have one) for your aftercare needs so that they can reassess your need for medications and monitor your lab values.   Allergies as of 10/15/2018      Reactions   Ace Inhibitors Anaphylaxis   REACTION: glossal edema   Lisinopril Anaphylaxis      Medication List    STOP taking these medications   chlorhexidine gluconate (MEDLINE KIT) 0.12 % solution Commonly known as: PERIDEX   Potassium 75 MG Tabs     TAKE these medications   calcium-vitamin D 250-125 MG-UNIT tablet Commonly known as: OSCAL WITH D Take 1 tablet by mouth daily.   cephALEXin 500 MG capsule Commonly known as: KEFLEX Take 1 capsule (500 mg total) by mouth every 8 (eight) hours for 7 days.   [employer] VITAMIN B12 1000 MCG tablet Generic drug: cyanocobalamin TAKE 1 TABLET (1,000 MCG TOTAL) BY MOUTH DAILY.   ketoconazole 2 % shampoo Commonly known as: NIZORAL Apply 1 application topically 2 (two) times a week.   levothyroxine 150 MCG tablet Commonly known as: SYNTHROID Take 1 tablet (150 mcg total) by mouth daily before breakfast.   Matzim LA 360 MG 24 hr tablet Generic drug: diltiazem TAKE 1 TABLET (360 MG TOTAL) BY MOUTH DAILY. MUST KEEP UPCOMING APPT FOR FURTHER REFILLS What  changed: See the new instructions.   Vitamin D (Ergocalciferol) 1.25 MG (50000 UT) Caps capsule Commonly known as: DRISDOL TAKE ONE CAPSULE BY MOUTH ONE TIME PER WEEK What changed: See the new instructions.      Allergies  Allergen Reactions  . Ace Inhibitors Anaphylaxis    REACTION: glossal edema  . Lisinopril Anaphylaxis   Follow-up Information    Danford, Berna Spare, NP. Call in 1 day(s).   Specialty: Family Medicine Why: Please call for a post hospital follow-up appointment. Contact information: [place] [address] [phone]        Buford Dresser, MD .   Specialty: Cardiology Contact information: [address] [place] [address] [phone]            The results of significant diagnostics from this hospitalization (including imaging, microbiology, ancillary and laboratory) are listed below for reference.    Significant Diagnostic Studies: Dg Chest 2 View  Result Date: 09/28/2018 CLINICAL DATA:  History of asthma.  Dyspnea. EXAM: CHEST - 2 VIEW COMPARISON:  05/22/2018 FINDINGS: Cardiomediastinal silhouette is normal. Mediastinal contours appear intact. There is no evidence of focal airspace consolidation, pleural effusion or pneumothorax. Osseous structures are without acute abnormality. Soft tissues are grossly normal. IMPRESSION: No active cardiopulmonary disease. Electronically Signed   By: Fidela Salisbury M.D.   On: 09/28/2018 16:13   Ct  Abdomen Pelvis W Contrast  Result Date: 10/11/2018 CLINICAL DATA:  Patient with right flank pain and worsening fatigue. EXAM: CT ABDOMEN AND PELVIS WITH CONTRAST TECHNIQUE: Multidetector CT imaging of the abdomen and pelvis was performed using the standard protocol following bolus administration of intravenous contrast. CONTRAST:  121m OMNIPAQUE IOHEXOL 300 MG/ML  SOLN COMPARISON:  CT abdomen pelvis 06/26/2018 FINDINGS: Lower chest: Normal heart size. Minimal atelectasis right  lower lobe. No pleural effusion. Hepatobiliary: Liver is normal in size and contour. No focal hepatic lesions identified. Prior cholecystectomy. No intrahepatic or extrahepatic biliary ductal dilatation. Pancreas: Unremarkable Spleen: Unremarkable Adrenals/Urinary Tract: Normal adrenal glands. There is patchy hypoenhancement within the mid and superior pole of the left kidney (image 27; series 2). There is left perinephric fat stranding. Mild patchy hypoenhancement within the midpole of the right kidney. No hydronephrosis. Urinary bladder is unremarkable. Stomach/Bowel: Sigmoid colonic diverticulosis. No CT evidence for acute diverticulitis. Small hiatal hernia. Normal morphology of the stomach. No evidence for small bowel obstruction. No free fluid or free intraperitoneal air. Vascular/Lymphatic: Normal caliber abdominal aorta. Peripheral calcified atherosclerotic plaque. No retroperitoneal lymphadenopathy. Reproductive: Uterus is unremarkable. Similar-appearing 4.5 cm left ovarian cyst (image 59; series 2). Other: None. Musculoskeletal: Lumbar spine degenerative changes. No aggressive or acute appearing osseous lesions. IMPRESSION: 1. There is patchy hypoenhancement involving the left kidney with perinephric fat stranding raising the possibility of pyelonephritis. 2. Additionally there is suggestion of mild hypoenhancement within the midpole of the right kidney which may also represent pyelonephritis. 3. There is a 4.5 cm left ovarian cyst. This needs further evaluation with pelvic ultrasound in the non acute setting. Electronically Signed   By: DLovey NewcomerM.D.   On: 10/11/2018 23:03   Dg Chest Port 1 View  Result Date: 10/12/2018 CLINICAL DATA:  Sepsis EXAM: PORTABLE CHEST 1 VIEW COMPARISON:  09/28/2018, CT 05/22/2018 FINDINGS: Surgical hardware in the cervical spine. No focal consolidation or effusion. Mild cardiomegaly. No pneumothorax IMPRESSION: No active disease.  Mild cardiomegaly Electronically Signed    By: KDonavan FoilM.D.   On: 10/12/2018 03:26    Microbiology: Recent Results (from the past 240 hour(s))  Urine culture     Status: Abnormal   Collection Time: 10/11/18  9:04 PM   Specimen: Urine, Random  Result Value Ref Range Status   Specimen Description   Final    URINE, RANDOM [facility]formed at [facility] [address], [place]  [id number]   Special Requests   Final    NONE Performed at [facility] [address], [id number]   Culture (A)  Final    >=100,000 COLONIES/mL ESCHERICHIA COLI >=100,000 COLONIES/mL STAPHYLOCOCCUS LUGDUNENSIS    Report Status 10/14/2018 FINAL  Final   Organism ID, Bacteria ESCHERICHIA COLI (A)  Final   Organism ID, Bacteria STAPHYLOCOCCUS LUGDUNENSIS (A)  Final      Susceptibility   Escherichia coli - MIC*    AMPICILLIN >=32 RESISTANT Resistant     CEFAZOLIN <=4 SENSITIVE Sensitive     CEFTRIAXONE <=1 SENSITIVE Sensitive     CIPROFLOXACIN <=0.25 SENSITIVE Sensitive     GENTAMICIN <=1 SENSITIVE Sensitive     IMIPENEM <=0.25 SENSITIVE Sensitive     NITROFURANTOIN <=16 SENSITIVE Sensitive     TRIMETH/SULFA <=20 SENSITIVE Sensitive     AMPICILLIN/SULBACTAM >=32 RESISTANT Resistant     PIP/TAZO <=4 SENSITIVE Sensitive     Extended ESBL NEGATIVE Sensitive     * >=100,000 COLONIES/mL  ESCHERICHIA COLI   Staphylococcus lugdunensis - MIC*    CIPROFLOXACIN <=0.5 SENSITIVE Sensitive     GENTAMICIN <=0.5 SENSITIVE Sensitive     NITROFURANTOIN <=16 SENSITIVE Sensitive     OXACILLIN 2 SENSITIVE Sensitive     TETRACYCLINE <=1 SENSITIVE Sensitive     VANCOMYCIN <=0.5 SENSITIVE Sensitive     TRIMETH/SULFA <=10 SENSITIVE Sensitive     CLINDAMYCIN <=0.25 SENSITIVE Sensitive     RIFAMPIN <=0.5 SENSITIVE Sensitive     Inducible Clindamycin NEGATIVE Sensitive     * >=100,000 COLONIES/mL STAPHYLOCOCCUS LUGDUNENSIS  Culture, blood (Routine X 2) w Reflex to ID Panel     Status: None (Preliminary result)    Collection Time: 10/11/18  9:53 PM   Specimen: BLOOD  Result Value Ref Range Status   Specimen Description   Final    BLOOD LEFT ANTECUBITAL Performed at [facility], [address] [place], [place] [address]    Special Requests   Final    BOTTLES DRAWN AEROBIC AND ANAEROBIC Blood Culture adequate volume Performed at [facility], [address] [place], [place] [address]    Culture   Final    NO GROWTH 2 DAYS Performed at [facility], [place] [address] [place], [place] [address]    Report Status PENDING  Incomplete  Culture, blood (Routine X 2) w Reflex to ID Panel     Status: None (Preliminary result)   Collection Time: 10/11/18  9:55 PM   Specimen: BLOOD LEFT HAND  Result Value Ref Range Status   Specimen Description   Final    BLOOD LEFT HAND Performed at [facility], [address] [place], [place] [address]    Special Requests   Final    BOTTLES DRAWN AEROBIC AND ANAEROBIC Blood Culture adequate volume Performed at [facility], [address] [place], [place] [address]    Culture   Final    NO GROWTH 2 DAYS Performed at [facility], [place] [address] [place], [place] [address]    Report Status PENDING  Incomplete  SARS Coronavirus 2 (Hosp order,Performed in [facility] lab via [employer] ID)     Status: None   Collection Time: 10/11/18 11:26 PM   Specimen: Dry Nasal Swab ([employer] ID Now)  Result Value Ref Range Status   SARS Coronavirus 2 ([employer] ID Now) NEGATIVE NEGATIVE Final    Comment: (NOTE) SARS-CoV-2 target nucleic acids are NOT DETECTED. The SARS-CoV-2 RNA is generally detectable in upper and lower respiratory specimens during the acute phase of infection.  Negativeresults do not preclude SARS-CoV-2 infection, do not rule out coinfections with other pathogens, and should not be used as the  sole basis for treatment or other patient management decisions.  Negative results must be combined with clinical  observations, patient history, and epidemiological information. The expected result is Negative. Fact Sheet for Patients: GolfingFamily.no Fact Sheet for Healthcare Providers: https://www.hernandez-brewer.com/ This test is not yet approved or cleared by the [place] FDA and  has been authorized for detection and/or diagnosis of SARS-CoV-2 by FDA under an Emergency Use Authorization (EUA).  This EUA will remain in effect (meaning this test can be used) for the duration of  the COVID19 declaration under Section 5 64(b)(1) of the Act, 21 U.S.C.  section 409 517 0788 3(b)(1), unless the authorization is terminated or revoked sooner. Performed at [facility], [address], [place], [place] [address]  SARS Coronavirus 2 (CEPHEID - Performed in [place] hospital lab), Hosp Order     Status: None   Collection Time: 10/12/18  2:43 AM   Specimen: Nasopharyngeal Swab  Result Value Ref Range Status   SARS Coronavirus 2 NEGATIVE NEGATIVE Final    Comment: (NOTE) If result is NEGATIVE SARS-CoV-2 target nucleic acids are NOT DETECTED. The SARS-CoV-2 RNA is generally detectable in upper and lower  respiratory specimens during the acute phase of infection. The lowest  concentration of SARS-CoV-2 viral copies this assay can detect is 250  copies / mL. A negative result does not preclude SARS-CoV-2 infection  and should not be used as the sole basis for treatment or other  patient management decisions.  A negative result may occur with  improper specimen collection / handling, submission of specimen other  than nasopharyngeal swab, presence of viral mutation(s) within the  areas targeted by this assay, and inadequate number of viral copies  (<250 copies / mL). A negative result must be combined with clinical  observations, patient history, and epidemiological information. If result is POSITIVE SARS-CoV-2 target nucleic acids are DETECTED. The  SARS-CoV-2 RNA is generally detectable in upper and lower  respiratory specimens dur ing the acute phase of infection.  Positive  results are indicative of active infection with SARS-CoV-2.  Clinical  correlation with patient history and other diagnostic information is  necessary to determine patient infection status.  Positive results do  not rule out bacterial infection or co-infection with other viruses. If result is PRESUMPTIVE POSTIVE SARS-CoV-2 nucleic acids MAY BE PRESENT.   A presumptive positive result was obtained on the submitted specimen  and confirmed on repeat testing.  While 2019 novel coronavirus  (SARS-CoV-2) nucleic acids may be present in the submitted sample  additional confirmatory testing may be necessary for epidemiological  and / or clinical management purposes  to differentiate between  SARS-CoV-2 and other Sarbecovirus currently known to infect humans.  If clinically indicated additional testing with an alternate test  methodology [phone]) is advised. The SARS-CoV-2 RNA is generally  detectable in upper and lower respiratory sp ecimens during the acute  phase of infection. The expected result is Negative. Fact Sheet for Patients:  StrictlyIdeas.no Fact Sheet for Healthcare Providers: BankingDealers.co.za This test is not yet approved or cleared by the [place] FDA and has been authorized for detection and/or diagnosis of SARS-CoV-2 by FDA under an Emergency Use Authorization (EUA).  This EUA will remain in effect (meaning this test can be used) for the duration of the COVID-19 declaration under Section 564(b)(1) of the Act, 21 U.S.C. section 360bbb-3(b)(1), unless the authorization is terminated or revoked sooner. Performed at [facility], [place] [address], [place], [place] [address]      Labs: Basic Metabolic Panel: Recent Labs  Lab 10/11/18 2153 10/12/18 0449 10/13/18 0427  NA  139  --  141  K 3.4*  --  4.0  CL 107  --  111  CO2 20*  --  24  GLUCOSE 142*  --  127*  BUN 15  --  12  CREATININE 1.01* 0.86 0.80  CALCIUM 9.2  --  9.0  MG  --   --  2.3   Liver Function Tests: Recent Labs  Lab 10/11/18 2153  AST 14*  ALT 13  ALKPHOS 82  BILITOT 0.9  PROT 7.7  ALBUMIN 3.8   Recent Labs  Lab 10/11/18 2153  LIPASE 33   No results for input(s): AMMONIA  in the last 168 hours. CBC: Recent Labs  Lab 10/11/18 2153 10/12/18 0449 10/13/18 0427 10/14/18 0510 10/15/18 0614  WBC 20.6* 19.7* 13.2* 12.1* 9.9  NEUTROABS 16.1*  --   --  7.6  --   HGB 14.5 13.8 12.5 13.1 13.0  HCT 45.6 43.5 41.4 42.1 41.0  MCV 96.6 98.6 99.3 98.1 97.9  PLT 319 313 305 355 370   Cardiac Enzymes: No results for input(s): CKTOTAL, CKMB, CKMBINDEX, TROPONINI in the last 168 hours. BNP: BNP (last 3 results) No results for input(s): BNP in the last 8760 hours.  ProBNP (last 3 results) No results for input(s): PROBNP in the last 8760 hours.  CBG: No results for input(s): GLUCAP in the last 168 hours.     Signed:  Kayleen Memos, MD [facility] 10/15/2018, 8:16 AM

## 2018-10-15 NOTE — Care Management Important Message (Signed)
Important Message  Patient Details IM Letter given to Marney Doctor RN to present to the Patient Name: Kayla [name] MRN: [id number] Date of Birth: 08-Jul-1945   Medicare Important Message Given:  Yes     Kerin Salen 10/15/2018, 12:00 PM

## 2018-10-15 NOTE — Discharge Instructions (Signed)
Urosepsis, Adult  Urosepsis is a type of sepsis. Sepsis is a severe bodily reaction to an infection. Urosepsis is caused by a bacterial infection that starts in the urinary tract and spreads to the blood. The urinary tract is the system where urine is made, stored, and passed out of the body and includes the kidneys, ureters, bladder, and urethra. This may also be called the urinary system. In severe cases, sepsis can lead to septic shock. Septic shock can weaken your heart and cause your blood pressure to drop. This can make the body's central nervous system and other vital organs stop working. Urosepsis is a medical emergency that requires immediate treatment in a hospital. What are the causes? Common causes of this condition include:  A urinary tract infection (UTI) that spreads to your blood.  A urinary tract blockage due to kidney stones.  Swelling and inflammation of the prostate (prostatitis) or prostate infection, in males. What increases the risk? You are more likely to develop this condition if you:  Are female, especially if you are sexually active.  Are age 52 or older.  Have a long-term disease, such as kidney disease or diabetes.  Have a weak disease-fighting system (immune system).  Have a condition that lessens or changes urine flow, such as a kidney or bladder stone, prostate disease, or a tumor in the urinary tract.  Have had surgery in an area of the urinary tract.  Have a small, thin tube in your urethra that drains urine from your bladder for a period of time (indwelling urinary catheter).  Have lost feeling below the waist or are in a wheelchair. What are the signs or symptoms? Early symptoms of this condition are similar to symptoms of a severe UTI. Common symptoms of this condition include:  Pain in your side, back, or lower abdomen.  Fever and chills.  Nausea and vomiting.  Frequent need to pass urine.  Burning pain when passing urine.  Bloody  or cloudy urine.  Bad-smelling urine.  Fatigue.  Trouble passing urine or not being able to pass urine at all. Once the infection has spread to the blood and a sepsis reaction starts, other symptoms may include:  Chills with shaking.  Cold and clammy skin.  A fever of 101.71F (38.5C) or higher.  Low body temperature of 96.70F (36C) or lower.  Fast breathing.  Trouble breathing.  Fast heartbeat.  Severe pain in the abdomen.  Muscle aches.  Anxiety.  Problems staying awake.  Confusion.  Fainting. How is this diagnosed? This condition is diagnosed based on your symptoms, your medical history, and a physical exam. You may also have:  Urine tests or blood tests to check kidney function and to look for infection.  Imaging tests such as a CT scan or ultrasound to check for blockages in the urinary system. How is this treated? This condition is a medical emergency that needs to be treated right away in the hospital. This condition may be treated with:  An IV so that you can quickly receive: ? Antibiotic medicines. ? Fluids. ? Medicines to support blood pressure.  Oxygen and breathing support, if needed.  Removing a urinary catheter if it is the source of the infection, if this applies.  Filtering your blood with a machine (dialysis). This process cleans your blood if your kidneys have failed.  Surgery to drain infected areas or restore urine flow. This is rare. Follow these instructions at home: Medicines  Take over-the-counter and prescription medicines only as  told by your health care provider.  If you were prescribed an antibiotic medicine, take it as told by your health care provider. Do not stop using the antibiotic even if you start to feel better. General instructions   Drink enough fluid to keep your urine pale yellow.  Return to your normal activities as told by your health care provider. Ask your health care provider what activities are safe for  you.  Keep all follow-up visits as told by your health care provider. This is important. Contact a health care provider if:  You have symptoms that get worse or do not get better with treatment.  You have new UTI symptoms. Get help right away if:  You have new or continued symptoms of sepsis after hospitalization, such as: ? A fever of 101.838F (38.5C) or higher. ? Low body temperature of 96.38F (36C) or lower. ? Chills. ? Severe pain. ? Difficulty breathing. ? Confusion. ? Sleepiness. ? Nausea and vomiting. These symptoms may represent a serious problem that is an emergency. Do not wait to see if the symptoms will go away. Get medical help right away. Call your local emergency services (911 in the [place] Do not drive yourself to the hospital. Summary  Urosepsis is a type of sepsis. Sepsis is a severe bodily reaction to an infection.  Urosepsis is a medical emergency that requires immediate treatment in a hospital.  Possible causes of urosepsis include a urinary tract infection that spreads to your blood, blockage from kidney stones, and prostate swelling or infection in males.  This condition may be treated with an IV so that you can quickly receive antibiotic medicines, fluids, and medicines to support your blood pressure. Other treatments may be used as well.  Get help right away if you have new or continued symptoms of sepsis after hospitalization. This information is not intended to replace advice given to you by your health care provider. Make sure you discuss any questions you have with your health care provider. Document Released: 03/25/2005 Document Revised: 04/22/2018 Document Reviewed: 04/23/2018 Elsevier Patient Education  [place]. Pyelonephritis, Adult  Pyelonephritis is an infection that occurs in the kidney. The kidneys are organs that help clean the blood by moving waste out of the blood and into the pee (urine). This infection can happen quickly, or it  can last for a long time. In most cases, it clears up with treatment and does not cause other problems. What are the causes? This condition may be caused by:  Germs (bacteria) going from the bladder up to the kidney. This may happen after having a bladder infection.  Germs going from the blood to the kidney. What increases the risk? This condition is more likely to develop in:  Pregnant women.  Older people.  People who have any of these conditions: ? Diabetes. ? Inflammation of the prostate gland (prostatitis), in males. ? Kidney stones or bladder stones. ? Other problems with the kidney or the parts of your body that carry pee from the kidneys to the bladder (ureters). ? Cancer.  People who have a small, thin tube (catheter) placed in the bladder.  People who are sexually active.  Women who use a medicine that kills sperm (spermicide) to prevent pregnancy.  People who have had a prior urinary tract infection (UTI). What are the signs or symptoms? Symptoms of this condition include:  Peeing often.  A strong urge to pee right away.  Burning or stinging when peeing.  Belly pain.  Back  pain.  Pain in the side (flank area).  Fever or chills.  Blood in the pee, or dark pee.  Feeling sick to your stomach (nauseous) or throwing up (vomiting). How is this treated? This condition may be treated by:  Taking antibiotic medicines by mouth (orally).  Drinking enough fluids. If the infection is bad, you may need to stay in the hospital. You may be given antibiotics and fluids that are put directly into a vein through an IV tube. In some cases, other treatments may be needed. Follow these instructions at home: Medicines  Take your antibiotic medicine as told by your doctor. Do not stop taking the antibiotic even if you start to feel better.  Take over-the-counter and prescription medicines only as told by your doctor. General instructions   Drink enough fluid to  keep your pee pale yellow.  Avoid caffeine, tea, and carbonated drinks.  Pee (urinate) often. Avoid holding in pee for long periods of time.  Pee before and after sex.  After pooping (having a bowel movement), women should wipe from front to back. Use each tissue only once.  Keep all follow-up visits as told by your doctor. This is important. Contact a doctor if:  You do not feel better after 2 days.  Your symptoms get worse.  You have a fever. Get help right away if:  You cannot take your medicine or drink fluids as told.  You have chills and shaking.  You throw up.  You have very bad pain in your side or back.  You feel very weak or you pass out (faint). Summary  Pyelonephritis is an infection that occurs in the kidney.  In most cases, this infection clears up with treatment and does not cause other problems.  Take your antibiotic medicine as told by your doctor. Do not stop taking the antibiotic even if you start to feel better.  Drink enough fluid to keep your pee pale yellow. This information is not intended to replace advice given to you by your health care provider. Make sure you discuss any questions you have with your health care provider. Document Released: 05/02/2004 Document Revised: 01/27/2018 Document Reviewed: 01/27/2018 Elsevier Patient Education  2020 [employer].

## 2018-10-15 NOTE — Progress Notes (Signed)
   10/15/18 1959  AVS Discharge Documentation  AVS Discharge Instructions Including Medications Provided to patient/caregiver  Name of Person Receiving AVS Discharge Instructions Including Medications Tonye Royalty  Name of Clinician That Reviewed AVS Discharge Instructions Including Medications Rich Reining

## 2018-10-17 LAB — CULTURE, BLOOD (ROUTINE X 2)
Culture: NO GROWTH
Culture: NO GROWTH
Special Requests: ADEQUATE
Special Requests: ADEQUATE

## 2018-10-20 ENCOUNTER — Telehealth: Payer: Self-pay | Admitting: Adult Health

## 2018-10-20 DIAGNOSIS — G4489 Other headache syndrome: Secondary | ICD-10-CM

## 2018-10-20 DIAGNOSIS — E079 Disorder of thyroid, unspecified: Secondary | ICD-10-CM

## 2018-10-20 NOTE — Telephone Encounter (Signed)
Orders placed for referrals.  Charyl Bigger, CMA

## 2018-10-20 NOTE — Telephone Encounter (Signed)
Patient is requesting two specialist referrals;  1. Endo, and she is requesting a endo specialist at [facility], she states that Valetta Fuller has been saying she needs one so she is ready to get this arranged.  2. Neuro, she has been having headaches and slight memory issues and was advised while in patient that she should see a neurologist. She would like to see Dr. Jaynee Eagles at [facility].  Please advise and place referrals if applicable.

## 2018-10-20 NOTE — Addendum Note (Signed)
Addended by: Fonnie Mu on: 10/20/2018 04:54 PM   Modules accepted: Orders

## 2018-10-27 ENCOUNTER — Encounter (HOSPITAL_BASED_OUTPATIENT_CLINIC_OR_DEPARTMENT_OTHER): Payer: Self-pay | Admitting: Emergency Medicine

## 2018-10-27 ENCOUNTER — Other Ambulatory Visit: Payer: Self-pay

## 2018-10-27 ENCOUNTER — Emergency Department (HOSPITAL_BASED_OUTPATIENT_CLINIC_OR_DEPARTMENT_OTHER)
Admission: EM | Admit: 2018-10-27 | Discharge: 2018-10-27 | Disposition: A | Discharge status: Home / Self Care | Payer: Medicare Other | Attending: Emergency Medicine | Admitting: Emergency Medicine

## 2018-10-27 ENCOUNTER — Institutional Professional Consult (permissible substitution): Payer: Medicare Other | Admitting: Emergency Medicine

## 2018-10-27 DIAGNOSIS — E039 Hypothyroidism, unspecified: Secondary | ICD-10-CM | POA: Diagnosis not present

## 2018-10-27 DIAGNOSIS — Z9049 Acquired absence of other specified parts of digestive tract: Secondary | ICD-10-CM | POA: Insufficient documentation

## 2018-10-27 DIAGNOSIS — I10 Essential (primary) hypertension: Secondary | ICD-10-CM | POA: Diagnosis not present

## 2018-10-27 DIAGNOSIS — J45909 Unspecified asthma, uncomplicated: Secondary | ICD-10-CM | POA: Insufficient documentation

## 2018-10-27 DIAGNOSIS — R300 Dysuria: Secondary | ICD-10-CM | POA: Diagnosis not present

## 2018-10-27 LAB — URINALYSIS, MICROSCOPIC (REFLEX)

## 2018-10-27 LAB — URINALYSIS, ROUTINE W REFLEX MICROSCOPIC
Bilirubin Urine: NEGATIVE
Glucose, UA: NEGATIVE mg/dL
Ketones, ur: NEGATIVE mg/dL
Leukocytes,Ua: NEGATIVE
Nitrite: NEGATIVE
Protein, ur: NEGATIVE mg/dL
Specific Gravity, Urine: >1.03 — ABNORMAL HIGH (ref 1.005–1.030)
pH: 6 (ref 5.0–8.0)

## 2018-10-27 MED ORDER — SULFAMETHOXAZOLE-TRIMETHOPRIM 800-160 MG PO TABS: 1.0000 | ORAL_TABLET | Freq: Two times a day (BID) | ORAL | 0 refills | Status: AC

## 2018-10-27 NOTE — ED Provider Notes (Signed)
Emergency Department Provider Note   I have reviewed the triage vital signs and the nursing notes.   HISTORY  Chief Complaint Dysuria   HPI Kayla [name] is a 73 y.o. female with PMH reviewed below presents to the ED for evaluation of of dysuria.  Patient states that she has had burning discomfort starting today.  She has not experienced any fever or other flulike symptoms.  She is having some tenderness in her lower back but states it is difficult to say whether or not this is from her fibromyalgia or from kidney type pain.  She was admitted recently for bilateral pyelonephritis and completed her course of Keflex.  Symptoms improved significantly.  She describes dysuria and some hesitancy along with dark urination.  She did a over-the-counter AZO type home test which came back "dark" meaning infection presumably.  She denies unilateral back pain.  No anterior abdominal pain.  No diarrhea or vomiting.   Past Medical History:  Diagnosis Date  . Anemia   . Asthma   . B12 deficiency   . Chronic LBP   . Diastolic dysfunction   . Fibromyalgia   . GERD (gastroesophageal reflux disease)   . Hyperlipemia   . Hypertension   . Hypothyroid   . IBS (irritable bowel syndrome)   . S/P cardiac cath 11/30/2007   normal coronaries - Dr. Burt Knack (Dr. Debara Pickett reviewed films on 01/10/2016)    Patient Active Problem List   Diagnosis Date Noted  . Pyelonephritis 10/12/2018  . Acute pyelonephritis 10/12/2018  . Chronic maxillary sinusitis 07/07/2018  . Encounter for Medicare annual wellness exam 06/04/2018  . Snoring 06/01/2018  . Costochondritis 06/01/2018  . Family history of heart disease 06/01/2018  . Class 2 severe obesity due to excess calories with serious comorbidity and body mass index (BMI) of 38.0 to 38.9 in adult ([place]) 06/01/2018  . Hematuria 04/28/2017  . Multiple atypical skin moles 04/28/2017  . Swollen lymph nodes 04/28/2017  . Screening for colon cancer 04/28/2017  . Healthcare  maintenance 04/28/2017  . Shakiness 11/14/2016  . Otitis media of left ear 09/11/2016  . Depression, recurrent ([place]) 07/16/2016  . Adjustment disorder with mixed anxiety and depressed mood 01/15/2016  . Fe Def Anemia 12/22/2015  . Scoliosis (and kyphoscoliosis), idiopathic 12/20/2015  . Osteopenia 12/20/2015  . PCP NOTES >>> 01/22/2015  . Fibromyalgia 08/02/2014  . Dyspnea 07/07/2014  . GAD (generalized anxiety disorder) 09/17/2012  . UTI (lower urinary tract infection) 01/24/2012  . Vitamin D deficiency 01/24/2012  . Insomnia 01/24/2012  . Fatigue 10/14/2011  . Neck pain 07/23/2010  . Screening for breast cancer 07/23/2010  . B12 deficiency   . Hyperlipidemia 11/30/2007  . Coronary artery disease involving native heart without angina pectoris 11/30/2007  . PALPITATIONS, OCCASIONAL 04/15/2007  . Thyroid disease 01/27/2007  . Essential hypertension 01/27/2007  . Reactive airway disease that is not asthma 01/27/2007  . GERD 01/27/2007  . IBS 01/27/2007  . BACK PAIN, CHRONIC 01/27/2007    Past Surgical History:  Procedure Laterality Date  . CESAREAN SECTION    . CHOLECYSTECTOMY    . NECK SURGERY  1998    Allergies Ace inhibitors and Lisinopril  Family History  Problem Relation Age of Onset  . Heart attack Father 41  . Lung cancer Other        uncle, non smoker  . Leukemia Other        uncle  . Cancer Mother        oral  .  Stroke Mother   . Cancer Maternal Aunt        breast  . Alcohol abuse Maternal Aunt   . Alcohol abuse Maternal Uncle   . Cancer Maternal Uncle        lung, stomach, oral  . Cancer Paternal Uncle        lung  . Congestive Heart Failure Maternal Grandfather   . Heart attack Paternal Grandfather   . Cancer Paternal Uncle        bone  . Colon cancer Neg Hx   . Breast cancer Neg Hx   . Diabetes Neg Hx     Social History Social History   Tobacco Use  . Smoking status: Never Smoker  . Smokeless tobacco: Never Used  Substance Use Topics   . Alcohol use: No  . Drug use: No    Review of Systems  Constitutional: No fever/chills Eyes: No visual changes. ENT: No sore throat. Cardiovascular: Denies chest pain. Respiratory: Denies shortness of breath. Gastrointestinal: No abdominal pain.  No nausea, no vomiting.  No diarrhea.  No constipation. Genitourinary: Positive for dysuria. Musculoskeletal: Positive for back pain. Skin: Negative for rash. Neurological: Negative for headaches, focal weakness or numbness.  10-point ROS otherwise negative.  ____________________________________________   PHYSICAL EXAM:  VITAL SIGNS: ED Triage Vitals  Enc Vitals Group     BP 10/27/18 1653 (!) 140/104     Pulse Rate 10/27/18 1653 83     Resp 10/27/18 1653 18     Temp 10/27/18 1653 98.2 F (36.8 C)     Temp Source 10/27/18 1653 Oral     SpO2 10/27/18 1653 98 %     Pain Score 10/27/18 1655 4   Constitutional: Alert and oriented. Well appearing and in no acute distress. Eyes: Conjunctivae are normal. Head: Atraumatic. Nose: No congestion/rhinnorhea. Mouth/Throat: Mucous membranes are moist.  Neck: No stridor.   Cardiovascular: Normal rate, regular rhythm. Good peripheral circulation. Grossly normal heart sounds.   Respiratory: Normal respiratory effort.  No retractions. Lungs CTAB. Gastrointestinal: Soft and nontender. No distention. No CVA tenderness.  Musculoskeletal: No lower extremity tenderness nor edema. No gross deformities of extremities. Neurologic:  Normal speech and language. No gross focal neurologic deficits are appreciated.  Skin:  Skin is warm, dry and intact. No rash noted.  ____________________________________________   LABS (all labs ordered are listed, but only abnormal results are displayed)  Labs Reviewed  URINALYSIS, ROUTINE W REFLEX MICROSCOPIC - Abnormal; Notable for the following components:      Result Value   Specific Gravity, Urine >1.030 (*)    Hgb urine dipstick TRACE (*)    All other  components within normal limits  URINALYSIS, MICROSCOPIC (REFLEX) - Abnormal; Notable for the following components:   Bacteria, UA RARE (*)    All other components within normal limits  URINE CULTURE   ____________________________________________   PROCEDURES  Procedure(s) performed:   Procedures  None  ____________________________________________   INITIAL IMPRESSION / ASSESSMENT AND PLAN / ED COURSE  Pertinent labs & imaging results that were available during my care of the patient were reviewed by me and considered in my medical decision making (see chart for details).   Patient presents to the emergency department with burning urination and lower back discomfort.  Pain in her lower back is low lumbar area.  No CVA tenderness or discomfort over the kidney area.  Seems more musculoskeletal.  Patient is without fever either at home or here in the emergency department.  Lab  work is unremarkable.  No findings to suspect sepsis.  I compare today's UA to her UA during her prior admission.  It is greatly improved.  No clear UTI.  She does have some calcium oxalate crystals but given her clinical findings on exam I do not feel that CT imaging at this time is beneficial.  Patient is without unilateral flank pain or clear UTI on UA.  I reviewed her urine culture which showed sensitivity to Bactrim.  Kidney function normal from her prior admission.  She is urinating normally here.  Plan for Bactrim over the next 7 days and have resent a urine culture.  Patient instructed to return to the emergency department immediately if she develops worsening pain, fever, flulike symptoms, or confusion.   ____________________________________________  FINAL CLINICAL IMPRESSION(S) / ED DIAGNOSES  Final diagnoses:  Dysuria    NEW OUTPATIENT MEDICATIONS STARTED DURING THIS VISIT:  New Prescriptions   SULFAMETHOXAZOLE-TRIMETHOPRIM (BACTRIM DS) 800-160 MG TABLET    Take 1 tablet by mouth 2 (two) times daily  for 7 days.    Note:  This document was prepared using Dragon voice recognition software and may include unintentional dictation errors.  Nanda Quinton, MD Emergency Medicine    Dealva Lafoy, [facility], MD 10/27/18 2059

## 2018-10-27 NOTE — Discharge Instructions (Signed)
You were seen in the emergency department today with UTI-like symptoms.  I am starting a second antibiotic and sending her urine for culture.  Please return to the emergency department if you develop worsening pain, fevers, shaking chills, confusion, or other sudden worsening symptoms.  Please call your primary care doctor tomorrow to follow-up regarding her symptoms and schedule the next available follow-up appointment.

## 2018-10-27 NOTE — ED Triage Notes (Signed)
Pt presents with c/o burning with urination and tenderness in back. Pt was recently discharged from Hospital after having sepsis and UTI. Pt is VERY HOH. Pt used AZO standard test strip at home and showed dark purple.

## 2018-10-29 LAB — URINE CULTURE

## 2018-11-02 ENCOUNTER — Other Ambulatory Visit: Payer: Medicare Other

## 2018-11-02 ENCOUNTER — Encounter: Payer: Self-pay | Admitting: Adult Health

## 2018-11-02 ENCOUNTER — Ambulatory Visit: Payer: Medicare Other | Admitting: Adult Health

## 2018-11-02 ENCOUNTER — Other Ambulatory Visit: Payer: Self-pay

## 2018-11-02 DIAGNOSIS — I10 Essential (primary) hypertension: Secondary | ICD-10-CM

## 2018-11-02 DIAGNOSIS — Z0000 Encounter for general adult medical examination without abnormal findings: Secondary | ICD-10-CM

## 2018-11-02 DIAGNOSIS — R300 Dysuria: Secondary | ICD-10-CM | POA: Diagnosis not present

## 2018-11-02 DIAGNOSIS — E782 Mixed hyperlipidemia: Secondary | ICD-10-CM

## 2018-11-02 DIAGNOSIS — Z131 Encounter for screening for diabetes mellitus: Secondary | ICD-10-CM

## 2018-11-02 DIAGNOSIS — E079 Disorder of thyroid, unspecified: Secondary | ICD-10-CM

## 2018-11-02 LAB — POCT URINALYSIS DIPSTICK
Glucose, UA: NEGATIVE
Leukocytes, UA: NEGATIVE
Nitrite, UA: NEGATIVE
Protein, UA: NEGATIVE
Spec Grav, UA: >=1.03 — AB (ref 1.010–1.025)
Urobilinogen, UA: 0.2 E.U./dL
pH, UA: 5.5 (ref 5.0–8.0)

## 2018-11-02 MED ORDER — PHENAZOPYRIDINE HCL 100 MG PO TABS: 100.0000 mg | ORAL_TABLET | Freq: Three times a day (TID) | ORAL | 0 refills | Status: DC | PRN

## 2018-11-02 NOTE — Progress Notes (Signed)
Subjective:    Patient ID: Kayla [name], female    DOB: 12/01/1945, 73 y.o.   MRN: [id number]  HPI:  Kayla [name] presents with dysuria (however it is decreasing) and lower abdominal pressure that has never really stopped since the beginning of July. She denies N/V/D She denies hematuria/hematochezia She denies increase in dyspnea- has upcoming appt's with her pulmonologist   She was seen at ED pn 10/27/2018 for dysuria- "Kayla [name] is a 73 y.o. female with PMH reviewed below presents to the ED for evaluation of of dysuria.  Patient states that she has had burning discomfort starting today.  She has not experienced any fever or other flulike symptoms.  She is having some tenderness in her lower back but states it is difficult to say whether or not this is from her fibromyalgia or from kidney type pain.  She was admitted recently for bilateral pyelonephritis and completed her course of Keflex.  Symptoms improved significantly.  She describes dysuria and some hesitancy along with dark urination.  She did a over-the-counter AZO type home test which came back "dark" meaning infection presumably.  She denies unilateral back pain.  No anterior abdominal pain.  No diarrhea or vomiting". UA: Leu Neg Nitrate Neg C/S-  MULTIPLE SPECIES PRESENT, SUGGEST RECOLLECTIONAbnormal  She was provided- SULFAMETHOXAZOLE-TRIMETHOPRIM (BACTRIM DS) 800-160 MG TABLET- provided 7 day course - she has 2 or 3 days left, she is unsure  10/11/2018-10/15/2018 for Hospital Admission for Acute Pyelonephritis  COVID Test- Negative Treated with IV ceftriaxone 10/11/18>>10/14/18. Keflex 10/14/18>>  Patient Care Team    Relationship Specialty Notifications Start End  Mina Marble D, NP PCP - General Family Medicine  11/14/16   Buford Dresser, MD PCP - Cardiology Cardiology Admissions 03/26/18   Allyn Kenner, MD  Dermatology  12/20/15   Pixie Casino, MD Consulting Physician Cardiology  01/01/16   Lahoma Rocker, MD Referring  Physician Rheumatology  01/01/16    Comment: fibromyalgia/  and Blima Rich and scolisois  [facility]    08/04/17   Jerrell Belfast, MD Consulting Physician Otolaryngology  10/28/18     Patient Active Problem List   Diagnosis Date Noted  . Dysuria 11/02/2018  . Pyelonephritis 10/12/2018  . Acute pyelonephritis 10/12/2018  . Chronic maxillary sinusitis 07/07/2018  . Encounter for Medicare annual wellness exam 06/04/2018  . Snoring 06/01/2018  . Costochondritis 06/01/2018  . Family history of heart disease 06/01/2018  . Class 2 severe obesity due to excess calories with serious comorbidity and body mass index (BMI) of 38.0 to 38.9 in adult ([place]) 06/01/2018  . Hematuria 04/28/2017  . Multiple atypical skin moles 04/28/2017  . Swollen lymph nodes 04/28/2017  . Screening for colon cancer 04/28/2017  . Healthcare maintenance 04/28/2017  . Shakiness 11/14/2016  . Otitis media of left ear 09/11/2016  . Depression, recurrent ([place]) 07/16/2016  . Adjustment disorder with mixed anxiety and depressed mood 01/15/2016  . Fe Def Anemia 12/22/2015  . Scoliosis (and kyphoscoliosis), idiopathic 12/20/2015  . Osteopenia 12/20/2015  . PCP NOTES >>> 01/22/2015  . Fibromyalgia 08/02/2014  . Dyspnea 07/07/2014  . GAD (generalized anxiety disorder) 09/17/2012  . UTI (lower urinary tract infection) 01/24/2012  . Vitamin D deficiency 01/24/2012  . Insomnia 01/24/2012  . Fatigue 10/14/2011  . Neck pain 07/23/2010  . Screening for breast cancer 07/23/2010  . B12 deficiency   . Hyperlipidemia 11/30/2007  . Coronary artery disease involving native heart without angina pectoris 11/30/2007  . PALPITATIONS, OCCASIONAL  04/15/2007  . Thyroid disease 01/27/2007  . Essential hypertension 01/27/2007  . Reactive airway disease that is not asthma 01/27/2007  . GERD 01/27/2007  . IBS 01/27/2007  . BACK PAIN, CHRONIC 01/27/2007     Past Medical History:  Diagnosis Date  . Anemia   .  Asthma   . B12 deficiency   . Chronic LBP   . Diastolic dysfunction   . Fibromyalgia   . GERD (gastroesophageal reflux disease)   . Hyperlipemia   . Hypertension   . Hypothyroid   . IBS (irritable bowel syndrome)   . S/P cardiac cath 11/30/2007   normal coronaries - Dr. Burt Knack (Dr. Debara Pickett reviewed films on 01/10/2016)     Past Surgical History:  Procedure Laterality Date  . CESAREAN SECTION    . CHOLECYSTECTOMY    . NECK SURGERY  1998     Family History  Problem Relation Age of Onset  . Heart attack Father 41  . Lung cancer Other        uncle, non smoker  . Leukemia Other        uncle  . Cancer Mother        oral  . Stroke Mother   . Cancer Maternal Aunt        breast  . Alcohol abuse Maternal Aunt   . Alcohol abuse Maternal Uncle   . Cancer Maternal Uncle        lung, stomach, oral  . Cancer Paternal Uncle        lung  . Congestive Heart Failure Maternal Grandfather   . Heart attack Paternal Grandfather   . Cancer Paternal Uncle        bone  . Colon cancer Neg Hx   . Breast cancer Neg Hx   . Diabetes Neg Hx      Social History   Substance and Sexual Activity  Drug Use No     Social History   Substance and Sexual Activity  Alcohol Use No     Social History   Tobacco Use  Smoking Status Never Smoker  Smokeless Tobacco Never Used     Outpatient Encounter Medications as of 11/02/2018  Medication Sig Note  . calcium-vitamin D (OSCAL WITH D) 250-125 MG-UNIT tablet Take 1 tablet by mouth daily.   . [employer] VITAMIN B12 1000 MCG tablet TAKE 1 TABLET (1,000 MCG TOTAL) BY MOUTH DAILY.   Marland Kitchen ketoconazole (NIZORAL) 2 % shampoo Apply 1 application topically 2 (two) times a week.   . levothyroxine (SYNTHROID) 150 MCG tablet Take 1 tablet (150 mcg total) by mouth daily before breakfast.   . MATZIM LA 360 MG 24 hr tablet TAKE 1 TABLET (360 MG TOTAL) BY MOUTH DAILY. MUST KEEP UPCOMING APPT FOR FURTHER REFILLS (Patient taking differently: Take 360 mg by mouth  daily. )   . sulfamethoxazole-trimethoprim (BACTRIM DS) 800-160 MG tablet Take 1 tablet by mouth 2 (two) times daily for 7 days.   . Vitamin D, Ergocalciferol, (DRISDOL) 50000 units CAPS capsule TAKE ONE CAPSULE BY MOUTH ONE TIME PER WEEK (Patient taking differently: Take 50,000 Units by mouth every 7 (seven) days. ) 10/12/2018: Pt ran out   . phenazopyridine (PYRIDIUM) 100 MG tablet Take 1 tablet (100 mg total) by mouth 3 (three) times daily as needed for pain.    No facility-administered encounter medications on file as of 11/02/2018.     Allergies: Ace inhibitors and Lisinopril  Body mass index is 37.35 kg/m.  Blood pressure (!) 151/77, pulse  76, temperature 98.2 F (36.8 C), temperature source Oral, height 5' 3.25" (1.607 m), weight 212 lb 8 oz (96.4 kg), SpO2 96 %.  Review of Systems  Constitutional: Positive for fatigue. Negative for activity change, appetite change, chills, diaphoresis, fever and unexpected weight change.  HENT: Positive for hearing loss. Negative for congestion.   Eyes: Negative for visual disturbance.  Respiratory: Positive for shortness of breath. Negative for cough, chest tightness, wheezing and stridor.   Cardiovascular: Negative for chest pain, palpitations and leg swelling.  Gastrointestinal: Negative for abdominal distention, abdominal pain, blood in stool, constipation, diarrhea, nausea and vomiting.  Genitourinary: Positive for dysuria. Negative for difficulty urinating, flank pain, frequency, hematuria and pelvic pain.  Hematological: Negative for adenopathy. Does not bruise/bleed easily.       Objective:   Physical Exam Vitals signs and nursing note reviewed.  Constitutional:      General: She is not in acute distress.    Appearance: She is obese. She is not ill-appearing, toxic-appearing or diaphoretic.  HENT:     Head: Normocephalic and atraumatic.  Cardiovascular:     Rate and Rhythm: Normal rate and regular rhythm.     Pulses: Normal pulses.      Heart sounds: Normal heart sounds. No friction rub. No gallop.   Pulmonary:     Effort: Pulmonary effort is normal. No respiratory distress.     Breath sounds: No wheezing or rales.  Abdominal:     General: Bowel sounds are normal. There is no distension.     Palpations: Abdomen is soft. There is no mass.     Tenderness: There is no abdominal tenderness. There is no right CVA tenderness, left CVA tenderness, guarding or rebound.     Hernia: No hernia is present.  Skin:    General: Skin is warm and dry.     Capillary Refill: Capillary refill takes less than 2 seconds.  Neurological:     Mental Status: She is alert and oriented to person, place, and time.  Psychiatric:        Mood and Affect: Mood normal.        Behavior: Behavior normal.        Thought Content: Thought content normal.        Judgment: Judgment normal.        Assessment & Plan:   1. Dysuria   2. Healthcare maintenance     Dysuria Complete the course of antibiotics that you were given from the Emergency Department. Continue to push fluids. Please take the Pyridium as directed/as needed. We will call you when the urine culture/senstivity results are available.   Healthcare maintenance Continue to social distance and wear a mask that covers your nose/mouth when out in public. Please keep all your various specialist follow-up appt's. Follow-up here in 4 months.    FOLLOW-UP:  Return in about 4 months (around 03/05/2019) for Regular Follow Up.

## 2018-11-02 NOTE — Assessment & Plan Note (Signed)
Complete the course of antibiotics that you were given from the Emergency Department. Continue to push fluids. Please take the Pyridium as directed/as needed. We will call you when the urine culture/senstivity results are available.

## 2018-11-02 NOTE — Patient Instructions (Addendum)
Dysuria Dysuria is pain or discomfort while urinating. The pain or discomfort may be felt in the part of your body that drains urine from the bladder (urethra) or in the surrounding tissue of the genitals. The pain may also be felt in the groin area, lower abdomen, or lower back. You may have to urinate frequently or have the sudden feeling that you have to urinate (urgency). Dysuria can affect both men and women, but it is more common in women. Dysuria can be caused by many different things, including:  Urinary tract infection.  Kidney stones or bladder stones.  Certain sexually transmitted infections (STIs), such as chlamydia.  Dehydration.  Inflammation of the tissues of the vagina.  Use of certain medicines.  Use of certain soaps or scented products that cause irritation. Follow these instructions at home: General instructions  Watch your condition for any changes.  Urinate often. Avoid holding urine for long periods of time.  After a bowel movement or urination, women should cleanse from front to back, using each tissue only once.  Urinate after sexual intercourse.  Keep all follow-up visits as told by your health care provider. This is important.  If you had any tests done to find the cause of dysuria, it is up to you to get your test results. Ask your health care provider, or the department that is doing the test, when your results will be ready. Eating and drinking   Drink enough fluid to keep your urine pale yellow.  Avoid caffeine, tea, and alcohol. They can irritate the bladder and make dysuria worse. In men, alcohol may irritate the prostate. Medicines  Take over-the-counter and prescription medicines only as told by your health care provider.  If you were prescribed an antibiotic medicine, take it as told by your health care provider. Do not stop taking the antibiotic even if you start to feel better. Contact a health care provider if:  You have a fever.   You develop pain in your back or sides.  You have nausea or vomiting.  You have blood in your urine.  You are not urinating as often as you usually do. Get help right away if:  Your pain is severe and not relieved with medicines.  You cannot eat or drink without vomiting.  You are confused.  You have a rapid heartbeat while at rest.  You have shaking or chills.  You feel extremely weak. Summary  Dysuria is pain or discomfort while urinating. Many different conditions can lead to dysuria.  If you have dysuria, you may have to urinate frequently or have the sudden feeling that you have to urinate (urgency).  Watch your condition for any changes. Keep all follow-up visits as told by your health care provider.  Make sure that you urinate often and drink enough fluid to keep your urine pale yellow. This information is not intended to replace advice given to you by your health care provider. Make sure you discuss any questions you have with your health care provider. Document Released: 12/22/2003 Document Revised: 03/07/2017 Document Reviewed: 01/09/2017 Elsevier Patient Education  [place].   Complete the course of antibiotics that you were given from the Emergency Department. Continue to push fluids. Please take the Pyridium as directed/as needed. We will call you when the urine culture/senstivity results are available. Continue to social distance and wear a mask that covers your nose/mouth when out in public. Please keep all your various specialist follow-up appt's. Follow-up here in 4 months. NICE  TO SEE YOU!

## 2018-11-02 NOTE — Assessment & Plan Note (Signed)
Continue to social distance and wear a mask that covers your nose/mouth when out in public. Please keep all your various specialist follow-up appt's. Follow-up here in 4 months.

## 2018-11-03 ENCOUNTER — Other Ambulatory Visit: Payer: Self-pay | Admitting: Adult Health

## 2018-11-03 LAB — COMPREHENSIVE METABOLIC PANEL
ALT: 13 IU/L (ref 0–32)
AST: 16 IU/L (ref 0–40)
Albumin/Globulin Ratio: 2 (ref 1.2–2.2)
Albumin: 4.7 g/dL (ref 3.7–4.7)
Alkaline Phosphatase: 94 IU/L (ref 39–117)
BUN/Creatinine Ratio: 11 — ABNORMAL LOW (ref 12–28)
BUN: 14 mg/dL (ref 8–27)
Bilirubin Total: 0.3 mg/dL (ref 0.0–1.2)
CO2: 19 mmol/L — ABNORMAL LOW (ref 20–29)
Calcium: 10 mg/dL (ref 8.7–10.3)
Chloride: 104 mmol/L (ref 96–106)
Creatinine, Ser: 1.23 mg/dL — ABNORMAL HIGH (ref 0.57–1.00)
GFR calc Af Amer: 50 mL/min/{1.73_m2} — ABNORMAL LOW (ref >59)
GFR calc non Af Amer: 44 mL/min/{1.73_m2} — ABNORMAL LOW (ref >59)
Globulin, Total: 2.4 g/dL (ref 1.5–4.5)
Glucose: 109 mg/dL — ABNORMAL HIGH (ref 65–99)
Potassium: 4.4 mmol/L (ref 3.5–5.2)
Sodium: 141 mmol/L (ref 134–144)
Total Protein: 7.1 g/dL (ref 6.0–8.5)

## 2018-11-03 LAB — CBC WITH DIFFERENTIAL/PLATELET
Basophils Absolute: 0.1 10*3/uL (ref 0.0–0.2)
Basos: 1 %
EOS (ABSOLUTE): 0.2 10*3/uL (ref 0.0–0.4)
Eos: 2 %
Hematocrit: 44 % (ref 34.0–46.6)
Hemoglobin: 14.6 g/dL (ref 11.1–15.9)
Immature Grans (Abs): 0 10*3/uL (ref 0.0–0.1)
Immature Granulocytes: 0 %
Lymphocytes Absolute: 3.7 10*3/uL — ABNORMAL HIGH (ref 0.7–3.1)
Lymphs: 31 %
MCH: 31.2 pg (ref 26.6–33.0)
MCHC: 33.2 g/dL (ref 31.5–35.7)
MCV: 94 fL (ref 79–97)
Monocytes Absolute: 1 10*3/uL — ABNORMAL HIGH (ref 0.1–0.9)
Monocytes: 8 %
Neutrophils Absolute: 7.1 10*3/uL — ABNORMAL HIGH (ref 1.4–7.0)
Neutrophils: 58 %
Platelets: 372 10*3/uL (ref 150–450)
RBC: 4.68 x10E6/uL (ref 3.77–5.28)
RDW: 13 % (ref 11.7–15.4)
WBC: 12.1 10*3/uL — ABNORMAL HIGH (ref 3.4–10.8)

## 2018-11-03 LAB — LIPID PANEL
Chol/HDL Ratio: 3 ratio (ref 0.0–4.4)
Cholesterol, Total: 166 mg/dL (ref 100–199)
HDL: 56 mg/dL (ref >39)
LDL Calculated: 84 mg/dL (ref 0–99)
Triglycerides: 132 mg/dL (ref 0–149)
VLDL Cholesterol Cal: 26 mg/dL (ref 5–40)

## 2018-11-03 LAB — HEMOGLOBIN A1C
Est. average glucose Bld gHb Est-mCnc: 114 mg/dL
Hgb A1c MFr Bld: 5.6 % (ref 4.8–5.6)

## 2018-11-03 LAB — TSH: TSH: 0.174 u[IU]/mL — ABNORMAL LOW (ref 0.450–4.500)

## 2018-11-03 MED ORDER — LEVOTHYROXINE SODIUM 125 MCG PO TABS: 125.0000 ug | ORAL_TABLET | Freq: Every day | ORAL | 0 refills | Status: DC

## 2018-11-04 ENCOUNTER — Other Ambulatory Visit: Payer: Self-pay | Admitting: Adult Health

## 2018-11-04 MED ORDER — ATORVASTATIN CALCIUM 20 MG PO TABS: 20.0000 mg | ORAL_TABLET | Freq: Every day | ORAL | 0 refills | Status: DC

## 2018-11-05 ENCOUNTER — Encounter (HOSPITAL_COMMUNITY): Payer: Self-pay

## 2018-11-05 ENCOUNTER — Other Ambulatory Visit: Payer: Self-pay

## 2018-11-05 ENCOUNTER — Emergency Department (HOSPITAL_COMMUNITY)
Admission: EM | Admit: 2018-11-05 | Discharge: 2018-11-05 | Disposition: A | Discharge status: Home / Self Care | Payer: Medicare Other | Attending: Emergency Medicine | Admitting: Emergency Medicine

## 2018-11-05 ENCOUNTER — Emergency Department (HOSPITAL_COMMUNITY): Payer: Medicare Other

## 2018-11-05 DIAGNOSIS — E039 Hypothyroidism, unspecified: Secondary | ICD-10-CM | POA: Diagnosis not present

## 2018-11-05 DIAGNOSIS — I10 Essential (primary) hypertension: Secondary | ICD-10-CM | POA: Diagnosis not present

## 2018-11-05 DIAGNOSIS — R42 Dizziness and giddiness: Secondary | ICD-10-CM

## 2018-11-05 DIAGNOSIS — R0602 Shortness of breath: Secondary | ICD-10-CM | POA: Diagnosis not present

## 2018-11-05 DIAGNOSIS — J45909 Unspecified asthma, uncomplicated: Secondary | ICD-10-CM | POA: Insufficient documentation

## 2018-11-05 LAB — CBC
HCT: 46.3 % — ABNORMAL HIGH (ref 36.0–46.0)
Hemoglobin: 14.7 g/dL (ref 12.0–15.0)
MCH: 31.6 pg (ref 26.0–34.0)
MCHC: 31.7 g/dL (ref 30.0–36.0)
MCV: 99.6 fL (ref 80.0–100.0)
Platelets: 313 10*3/uL (ref 150–400)
RBC: 4.65 MIL/uL (ref 3.87–5.11)
RDW: 13.4 % (ref 11.5–15.5)
WBC: 12.2 10*3/uL — ABNORMAL HIGH (ref 4.0–10.5)
nRBC: 0 % (ref 0.0–0.2)

## 2018-11-05 LAB — TROPONIN I (HIGH SENSITIVITY)
Troponin I (High Sensitivity): 8 ng/L (ref <18)
Troponin I (High Sensitivity): 11 ng/L (ref <18)

## 2018-11-05 LAB — BASIC METABOLIC PANEL
Anion gap: 10 (ref 5–15)
BUN: 14 mg/dL (ref 8–23)
CO2: 20 mmol/L — ABNORMAL LOW (ref 22–32)
Calcium: 9.5 mg/dL (ref 8.9–10.3)
Chloride: 110 mmol/L (ref 98–111)
Creatinine, Ser: 1.15 mg/dL — ABNORMAL HIGH (ref 0.44–1.00)
GFR calc Af Amer: 55 mL/min — ABNORMAL LOW (ref >60)
GFR calc non Af Amer: 47 mL/min — ABNORMAL LOW (ref >60)
Glucose, Bld: 105 mg/dL — ABNORMAL HIGH (ref 70–99)
Potassium: 4 mmol/L (ref 3.5–5.1)
Sodium: 140 mmol/L (ref 135–145)

## 2018-11-05 LAB — CULTURE, URINE COMPREHENSIVE

## 2018-11-05 LAB — D-DIMER, QUANTITATIVE: D-Dimer, Quant: 0.61 ug/mL-FEU — ABNORMAL HIGH (ref 0.00–0.50)

## 2018-11-05 NOTE — ED Triage Notes (Signed)
Patient BIB EMS from home. Patient woke up this morning and states she felt dizzy and "like my throat was closing." Patient had her son drive her to the fire department, where EMS was call. Per EMS-Lung sounds clear bilaterally, patient speaking in full sentences, managing secretions without assistance. VSS. Patient reports she was recently admitted "for sepsis secondary to acute kidney injury." EMS reports patient talked the entire drive to the hospital without issues. Patient ambulatory for EMS without assistance.  EMS VS: 138/92, 80SR, 16RR, 97.74F temporal, 207=CBG. EKG completed by EMS showed Sinus Rhythm.   18G Left AC PIV placed by EMS.

## 2018-11-05 NOTE — ED Notes (Signed)
Dr Bero at bedside.  

## 2018-11-05 NOTE — Discharge Instructions (Addendum)
You were evaluated in the Emergency Department and after careful evaluation, we did not find any emergent condition requiring admission or further testing in the hospital.  Your symptoms today seem to be due to pain related to acid reflux as well as a degree of anxiety or panic attack.  Your testing today was overall very reassuring.  We recommend follow-up with your PCP as well as follow-up with a counselor or psychologist.  Please return to the Emergency Department if you experience any worsening of your condition.  We encourage you to follow up with a primary care provider.  Thank you for allowing [place] to be a part of your care.

## 2018-11-05 NOTE — ED Provider Notes (Signed)
[facility] Emergency Department Provider Note MRN:  [id number]  Arrival date & time: 11/05/18     Chief Complaint   Dizziness   History of Present Illness   Kayla [name] is a 73 y.o. year-old female with a history of hypertension, fibromyalgia, GERD presenting to the ED with chief complaint of dizziness.  Patient woke from sleep very early in the morning with sudden onset shortness of breath, sensation of throat closure, felt her heart racing, felt as if she was going to pass out.  Went into her daughter's room to seek help.  She denies having chest pain.  Denies recent leg pain or swelling, no recent fever or cough, symptoms seemed to slowly improve in route with EMS.  Currently without symptoms.  Symptoms were severe, no exacerbating or relieving factors.  Review of Systems  A complete 10 system review of systems was obtained and all systems are negative except as noted in the HPI and PMH.   Patient's Health History    Past Medical History:  Diagnosis Date  . Anemia   . Asthma   . B12 deficiency   . Chronic LBP   . Diastolic dysfunction   . Fibromyalgia   . GERD (gastroesophageal reflux disease)   . Hyperlipemia   . Hypertension   . Hypothyroid   . IBS (irritable bowel syndrome)   . S/P cardiac cath 11/30/2007   normal coronaries - Dr. Burt Knack (Dr. Debara Pickett reviewed films on 01/10/2016)    Past Surgical History:  Procedure Laterality Date  . CESAREAN SECTION    . CHOLECYSTECTOMY    . NECK SURGERY  1998    Family History  Problem Relation Age of Onset  . Heart attack Father 76  . Lung cancer Other        uncle, non smoker  . Leukemia Other        uncle  . Cancer Mother        oral  . Stroke Mother   . Cancer Maternal Aunt        breast  . Alcohol abuse Maternal Aunt   . Alcohol abuse Maternal Uncle   . Cancer Maternal Uncle        lung, stomach, oral  . Cancer Paternal Uncle        lung  . Congestive Heart Failure Maternal Grandfather   .  Heart attack Paternal Grandfather   . Cancer Paternal Uncle        bone  . Colon cancer Neg Hx   . Breast cancer Neg Hx   . Diabetes Neg Hx     Social History   Socioeconomic History  . Marital status: Widowed    Spouse name: Not on file  . Number of children: 2  . Years of education: Not on file  . Highest education level: Not on file  Occupational History  . Not on file  Social Needs  . Financial resource strain: Not on file  . Food insecurity    Worry: Not on file    Inability: Not on file  . Transportation needs    Medical: Not on file    Non-medical: Not on file  Tobacco Use  . Smoking status: Never Smoker  . Smokeless tobacco: Never Used  Substance and Sexual Activity  . Alcohol use: No  . Drug use: No  . Sexual activity: Not on file  Lifestyle  . Physical activity    Days per week: Not on file  Minutes per session: Not on file  . Stress: Not on file  Relationships  . Social Herbalist on phone: Not on file    Gets together: Not on file    Attends religious service: Not on file    Active member of club or organization: Not on file    Attends meetings of clubs or organizations: Not on file    Relationship status: Not on file  . Intimate partner violence    Fear of current or ex partner: Not on file    Emotionally abused: Not on file    Physically abused: Not on file    Forced sexual activity: Not on file  Other Topics Concern  . Not on file  Social History Narrative   Lost husband 2009, lost mother 2016     Physical Exam  Vital Signs and Nursing Notes reviewed Vitals:   11/05/18 1100 11/05/18 1130  BP: (!) 149/90 (!) 166/89  Pulse:  73  Resp: 14 17  Temp:    SpO2:  96%    CONSTITUTIONAL: Well-appearing, NAD NEURO:  Alert and oriented x 3, no focal deficits EYES:  eyes equal and reactive ENT/NECK:  no LAD, no JVD CARDIO: Regular rate, well-perfused, normal S1 and S2 PULM:  CTAB no wheezing or rhonchi GI/GU:  normal bowel sounds,  non-distended, non-tender MSK/SPINE:  No gross deformities, no edema SKIN:  no rash, atraumatic PSYCH:  Appropriate speech and behavior  Diagnostic and Interventional Summary    EKG Interpretation  Date/Time:  Thursday November 05 2018 08:12:01 EDT Ventricular Rate:  76 PR Interval:    QRS Duration: 98 QT Interval:  376 QTC Calculation: 423 R Axis:   31 Text Interpretation:  Sinus rhythm Abnormal R-wave progression, early transition Confirmed by Gerlene Fee [id number]) on 11/05/2018 8:58:10 AM      Labs Reviewed  CBC - Abnormal; Notable for the following components:      Result Value   WBC 12.2 (*)    HCT 46.3 (*)    All other components within normal limits  BASIC METABOLIC PANEL - Abnormal; Notable for the following components:   CO2 20 (*)    Glucose, Bld 105 (*)    Creatinine, Ser 1.15 (*)    GFR calc non Af Amer 47 (*)    GFR calc Af Amer 55 (*)    All other components within normal limits  D-DIMER, QUANTITATIVE (NOT AT [facility]) - Abnormal; Notable for the following components:   D-Dimer, Quant 0.61 (*)    All other components within normal limits  TROPONIN I (HIGH SENSITIVITY)  TROPONIN I (HIGH SENSITIVITY)    DG Chest Port 1 View  Final Result      Medications - No data to display   Procedures Critical Care  ED Course and Medical Decision Making  I have reviewed the triage vital signs and the nursing notes.  Pertinent labs & imaging results that were available during my care of the patient were reviewed by me and considered in my medical decision making (see below for details).  Considering OSA, GERD, panic attack, less likely ACS, PE.  Patient has a history of panic attacks that felt similar but not for the past 2 years.  Multiple stressors in her life recently.  Screening with d-dimer and troponin.  EKG is without acute concerns.  D-dimer weakly elevated but negative when age-adjusted.  Troponin is minimally elevated x2 and in the absence of chest pain there is  little to  no concern for cardiac etiology today.  Favoring possible GERD related throat discomfort exacerbated by anxiety or panic attack.  Patient is appropriate for discharge with PCP and psychology follow-up.  After the discussed management above, the patient was determined to be safe for discharge.  The patient was in agreement with this plan and all questions regarding their care were answered.  ED return precautions were discussed and the patient will return to the ED with any significant worsening of condition.  Barth Kirks. Sedonia Small, MD [employer] [email or web address]  Final Clinical Impressions(s) / ED Diagnoses     ICD-10-CM   1. Dizziness  R42   2. SOB (shortness of breath)  R06.02 DG Chest [facility]    DG Chest [facility]    ED Discharge Orders    None         Maudie Flakes, MD 11/05/18 [phone]

## 2018-11-09 ENCOUNTER — Other Ambulatory Visit: Payer: Self-pay

## 2018-11-09 ENCOUNTER — Ambulatory Visit
Admission: EM | Admit: 2018-11-09 | Discharge: 2018-11-09 | Disposition: A | Discharge status: Home / Self Care | Payer: Medicare Other | Attending: Physician Assistant | Admitting: Physician Assistant

## 2018-11-09 DIAGNOSIS — N3090 Cystitis, unspecified without hematuria: Secondary | ICD-10-CM | POA: Diagnosis present

## 2018-11-09 DIAGNOSIS — I10 Essential (primary) hypertension: Secondary | ICD-10-CM | POA: Diagnosis not present

## 2018-11-09 DIAGNOSIS — B9689 Other specified bacterial agents as the cause of diseases classified elsewhere: Secondary | ICD-10-CM | POA: Diagnosis not present

## 2018-11-09 LAB — POCT URINALYSIS DIP (MANUAL ENTRY)
Bilirubin, UA: NEGATIVE
Glucose, UA: NEGATIVE mg/dL
Ketones, POC UA: NEGATIVE mg/dL
Nitrite, UA: NEGATIVE
Spec Grav, UA: 1.025 (ref 1.010–1.025)
Urobilinogen, UA: 0.2 E.U./dL — AB
pH, UA: 5.5 (ref 5.0–8.0)

## 2018-11-09 MED ORDER — CEPHALEXIN 500 MG PO CAPS: 500.0000 mg | ORAL_CAPSULE | Freq: Three times a day (TID) | ORAL | 0 refills | Status: AC

## 2018-11-09 NOTE — ED Provider Notes (Signed)
[name]    CSN: [id number] Arrival date & time: 11/09/18  1031     History   Chief Complaint Chief Complaint  Patient presents with  . Urinary Tract Infection    HPI Kayla [name] is a 73 y.o. female.   73 year old female comes in for 3-day history of urinary frequency, dysuria, urinary urgency.  Denies abdominal pain, nausea, vomiting.  Denies fever, chills, night sweats.  Denies vaginal discharge/itching.  Patient has back pain at baseline, no significant changes.  Of note, patient with history of pyelonephritis causing sepsis requiring admission, and is requesting CBC.     Past Medical History:  Diagnosis Date  . Anemia   . Asthma   . B12 deficiency   . Chronic LBP   . Diastolic dysfunction   . Fibromyalgia   . GERD (gastroesophageal reflux disease)   . Hyperlipemia   . Hypertension   . Hypothyroid   . IBS (irritable bowel syndrome)   . S/P cardiac cath 11/30/2007   normal coronaries - Dr. Burt Knack (Dr. Debara Pickett reviewed films on 01/10/2016)    Patient Active Problem List   Diagnosis Date Noted  . Dysuria 11/02/2018  . Pyelonephritis 10/12/2018  . Acute pyelonephritis 10/12/2018  . Chronic maxillary sinusitis 07/07/2018  . Encounter for Medicare annual wellness exam 06/04/2018  . Snoring 06/01/2018  . Costochondritis 06/01/2018  . Family history of heart disease 06/01/2018  . Class 2 severe obesity due to excess calories with serious comorbidity and body mass index (BMI) of 38.0 to 38.9 in adult ([place]) 06/01/2018  . Hematuria 04/28/2017  . Multiple atypical skin moles 04/28/2017  . Swollen lymph nodes 04/28/2017  . Screening for colon cancer 04/28/2017  . Healthcare maintenance 04/28/2017  . Shakiness 11/14/2016  . Otitis media of left ear 09/11/2016  . Depression, [name] 07/16/2016  . Adjustment disorder with mixed anxiety and depressed mood 01/15/2016  . Fe Def Anemia 12/22/2015  . Scoliosis (and kyphoscoliosis), idiopathic  12/20/2015  . Osteopenia 12/20/2015  . PCP NOTES >>> 01/22/2015  . Fibromyalgia 08/02/2014  . Dyspnea 07/07/2014  . GAD (generalized anxiety disorder) 09/17/2012  . UTI (lower urinary tract infection) 01/24/2012  . Vitamin D deficiency 01/24/2012  . Insomnia 01/24/2012  . Fatigue 10/14/2011  . Neck pain 07/23/2010  . Screening for breast cancer 07/23/2010  . B12 deficiency   . Hyperlipidemia 11/30/2007  . Coronary artery disease involving native heart without angina pectoris 11/30/2007  . PALPITATIONS, OCCASIONAL 04/15/2007  . Thyroid disease 01/27/2007  . Essential hypertension 01/27/2007  . Reactive airway disease that is not asthma 01/27/2007  . GERD 01/27/2007  . IBS 01/27/2007  . BACK PAIN, CHRONIC 01/27/2007    Past Surgical History:  Procedure Laterality Date  . CESAREAN SECTION    . CHOLECYSTECTOMY    . NECK SURGERY  1998    OB History   No obstetric history on file.      Home Medications    Prior to Admission medications   Medication Sig Start Date End Date Taking? Authorizing Provider  atorvastatin (LIPITOR) 20 MG tablet Take 1 tablet (20 mg total) by mouth daily. 11/04/18   Danford, Valetta Fuller D, NP  calcium-vitamin D (OSCAL WITH D) 250-125 MG-UNIT tablet Take 1 tablet by mouth daily.    [provider]  cephALEXin (KEFLEX) 500 MG capsule Take 1 capsule (500 mg total) by mouth 3 (three) times daily for 7 days. 11/09/18 11/16/18  Tasia Catchings, Breandan People V, PA-C  [facility] VITAMIN B12 1000 MCG  tablet TAKE 1 TABLET (1,000 MCG TOTAL) BY MOUTH DAILY. 03/24/18   Danford, Valetta Fuller D, NP  ketoconazole (NIZORAL) 2 % shampoo Apply 1 application topically 2 (two) times a week. 10/30/17   Mina Marble D, NP  levothyroxine (SYNTHROID) 125 MCG tablet Take 1 tablet (125 mcg total) by mouth daily before breakfast. 11/03/18   Danford, Valetta Fuller D, NP  MATZIM LA 360 MG 24 hr tablet TAKE 1 TABLET (360 MG TOTAL) BY MOUTH DAILY. MUST KEEP UPCOMING APPT FOR FURTHER REFILLS Patient taking differently: Take 360 mg  by mouth daily.  07/06/18   Hilty, Nadean Corwin, MD  phenazopyridine (PYRIDIUM) 100 MG tablet Take 1 tablet (100 mg total) by mouth 3 (three) times daily as needed for pain. 11/02/18   Danford, Valetta Fuller D, NP  Vitamin D, Ergocalciferol, (DRISDOL) 50000 units CAPS capsule TAKE ONE CAPSULE BY MOUTH ONE TIME PER WEEK Patient taking differently: Take 50,000 Units by mouth every 7 (seven) days.  11/27/17   [name], NP    Family History Family History  Problem Relation Age of Onset  . Heart attack Father 46  . Lung cancer Other        uncle, non smoker  . Leukemia Other        uncle  . Cancer Mother        oral  . Stroke Mother   . Cancer Maternal Aunt        breast  . Alcohol abuse Maternal Aunt   . Alcohol abuse Maternal Uncle   . Cancer Maternal Uncle        lung, stomach, oral  . Cancer Paternal Uncle        lung  . Congestive Heart Failure Maternal Grandfather   . Heart attack Paternal Grandfather   . Cancer Paternal Uncle        bone  . Colon cancer Neg Hx   . Breast cancer Neg Hx   . Diabetes Neg Hx     Social History Social History   Tobacco Use  . Smoking status: Never Smoker  . Smokeless tobacco: Never Used  Substance Use Topics  . Alcohol use: No  . Drug use: No     Allergies   Ace inhibitors and Lisinopril   Review of Systems Review of Systems  Reason unable to perform ROS: See HPI as above.     Physical Exam Triage Vital Signs ED Triage Vitals  Enc Vitals Group     BP 11/09/18 1041 (!) 155/90     Pulse Rate 11/09/18 1041 70     Resp 11/09/18 1041 17     Temp 11/09/18 1041 97.8 F (36.6 C)     Temp Source 11/09/18 1041 Oral     SpO2 11/09/18 1041 97 %     Weight --      Height --      Head Circumference --      Peak Flow --      Pain Score 11/09/18 1055 8     Pain Loc --      Pain Edu? --      Excl. in [place]? --    No data found.  Updated Vital Signs BP (!) 155/90 (BP Location: Right Arm)   Pulse 70   Temp 97.8 F (36.6 C) (Oral)    Resp 17   SpO2 97%   Physical Exam Constitutional:      General: She is not in acute distress.    Appearance: She is well-developed.  She is not ill-appearing, toxic-appearing or diaphoretic.  HENT:     Head: Normocephalic and atraumatic.  Eyes:     Conjunctiva/sclera: Conjunctivae normal.     Pupils: Pupils are equal, round, and reactive to light.  Cardiovascular:     Rate and Rhythm: Normal rate and regular rhythm.     Heart sounds: Normal heart sounds. No murmur. No friction rub. No gallop.   Pulmonary:     Effort: Pulmonary effort is normal.     Breath sounds: Normal breath sounds. No wheezing or rales.  Abdominal:     General: Bowel sounds are normal.     Palpations: Abdomen is soft.     Tenderness: There is no abdominal tenderness. There is no right CVA tenderness, left CVA tenderness, guarding or rebound.  Skin:    General: Skin is warm and dry.  Neurological:     Mental Status: She is alert and oriented to person, place, and time.  Psychiatric:        Behavior: Behavior normal.        Judgment: Judgment normal.      UC Treatments / Results  Labs (all labs ordered are listed, but only abnormal results are displayed) Labs Reviewed  POCT URINALYSIS DIP (MANUAL ENTRY) - Abnormal; Notable for the following components:      Result Value   Blood, UA trace-intact (*)    Protein Ur, POC trace (*)    Urobilinogen, UA 0.2 (*)    Leukocytes, UA Small (1+) (*)    All other components within normal limits  URINE CULTURE    EKG   Radiology No results found.  Procedures Procedures (including critical care time)  Medications Ordered in UC Medications - No data to display  Initial Impression / Assessment and Plan / UC Course  I have reviewed the triage vital signs and the nursing notes.  Pertinent labs & imaging results that were available during my care of the patient were reviewed by me and considered in my medical decision making (see chart for details).     Trace blood with leukocytes, will cover for urinary tract infection with Keflex.  At this time patient is afebrile without tachycardia/tachypnea.  Denies nausea/vomiting.  Negative CVA tenderness, lower suspicion for pyelonephritis.  Discussed with patient current history and exam, low suspicion for sepsis and CBC will not change treatment plan.  Discussed specific symptoms to watch out for including fever, flank pain, nausea/vomiting, tachycardia, and weakness/dizziness.  Patient expresses understanding and agrees to defer CBC at this time.  Will continue to monitor symptoms and call with any questions.  Again discussed return precautions.  Patient expresses understanding and agrees to plan.  Final Clinical Impressions(s) / UC Diagnoses   Final diagnoses:  Cystitis   ED Prescriptions    Medication Sig Dispense Auth. Provider   cephALEXin (KEFLEX) 500 MG capsule Take 1 capsule (500 mg total) by mouth 3 (three) times daily for 7 days. 21 capsule Tobin Chad, [place] 11/09/18 1259

## 2018-11-09 NOTE — ED Triage Notes (Signed)
Pt c/o urinary frequency, pain, and urgency since Saturday.

## 2018-11-09 NOTE — Discharge Instructions (Signed)
Your urine was positive for an urinary tract infection. Start keflex as directed. Keep hydrated, urine should be clear to pale yellow in color. Monitor for any worsening of symptoms, fever, worsening abdominal pain, nausea/vomiting, flank pain, follow up for reevaluation.  ° °

## 2018-11-11 LAB — URINE CULTURE: Culture: <10000 — AB

## 2018-11-12 ENCOUNTER — Other Ambulatory Visit: Payer: Self-pay | Admitting: Emergency Medicine

## 2018-11-13 ENCOUNTER — Telehealth: Payer: Self-pay | Admitting: Emergency Medicine

## 2018-11-16 ENCOUNTER — Telehealth: Payer: Self-pay | Admitting: Emergency Medicine

## 2018-11-16 MED ORDER — FLUCONAZOLE 200 MG PO TABS: 200.0000 mg | ORAL_TABLET | Freq: Once | ORAL | 0 refills | Status: AC

## 2018-11-16 NOTE — Telephone Encounter (Signed)
Patient calling requesting diflucan for yeast symptoms (vaginal pruritis) s/p antibiotic use.  States this is typical for her.  She was seen on 8/3, given keflex for UTI.  Pt states she has completed her abx course w/ UTI symptom resolution.

## 2018-11-16 NOTE — Telephone Encounter (Signed)
Attempted to call pt but line went straight to VM. Left message for pt to return call.   Some info pt could be told in regards to PFT which she has scheduled 8/18: 1. Do not smoke within at least 1 hr before test 2. Do not consume caffeine 4 hrs prior to testing 3. Do not consume alcohol within 4 hours prior to testing 4. Do not perform any vigorous exercise within 11min before test 5. Do not wear clothes that restrict chest area or abdomen 6. Do not use albuterol or xopenex 3 hrs before test or any other nebulizer medications or inhalers

## 2018-11-16 NOTE — Telephone Encounter (Signed)
Pt called back stating she s/w a friend that had a PFT done and didn't need a call back.  She wasn't for sure if it was like being in an MRI.

## 2018-11-20 ENCOUNTER — Other Ambulatory Visit (HOSPITAL_COMMUNITY)
Admission: RE | Admit: 2018-11-20 | Discharge: 2018-11-20 | Disposition: A | Discharge status: Home / Self Care | Payer: Medicare Other | Source: Ambulatory Visit | Attending: Emergency Medicine | Admitting: Emergency Medicine

## 2018-11-20 DIAGNOSIS — Z20828 Contact with and (suspected) exposure to other viral communicable diseases: Secondary | ICD-10-CM | POA: Diagnosis not present

## 2018-11-20 DIAGNOSIS — Z01812 Encounter for preprocedural laboratory examination: Secondary | ICD-10-CM | POA: Diagnosis present

## 2018-11-20 LAB — SARS CORONAVIRUS 2 (TAT 6-24 HRS): SARS Coronavirus 2: NEGATIVE

## 2018-11-24 ENCOUNTER — Ambulatory Visit (INDEPENDENT_AMBULATORY_CARE_PROVIDER_SITE_OTHER): Payer: Medicare Other | Admitting: Emergency Medicine

## 2018-11-24 ENCOUNTER — Encounter: Payer: Self-pay | Admitting: Emergency Medicine

## 2018-11-24 ENCOUNTER — Other Ambulatory Visit: Payer: Self-pay

## 2018-11-24 DIAGNOSIS — R0683 Snoring: Secondary | ICD-10-CM | POA: Diagnosis not present

## 2018-11-24 DIAGNOSIS — R0600 Dyspnea, unspecified: Secondary | ICD-10-CM

## 2018-11-24 DIAGNOSIS — R0602 Shortness of breath: Secondary | ICD-10-CM | POA: Diagnosis not present

## 2018-11-24 LAB — PULMONARY FUNCTION TEST
DL/VA % pred: 123 %
DL/VA: 5.07 ml/min/mmHg/L
DLCO cor % pred: 102 %
DLCO cor: 19.81 ml/min/mmHg
DLCO unc % pred: 106 %
DLCO unc: 20.56 ml/min/mmHg
FEF 25-75 Post: 1.97 L/sec
FEF 25-75 Pre: 1.57 L/sec
FEF2575-%Change-Post: 25 %
FEF2575-%Pred-Post: 111 %
FEF2575-%Pred-Pre: 89 %
FEV1-%Change-Post: 5 %
FEV1-%Pred-Post: 82 %
FEV1-%Pred-Pre: 78 %
FEV1-Post: 1.8 L
FEV1-Pre: 1.71 L
FEV1FVC-%Change-Post: 1 %
FEV1FVC-%Pred-Pre: 106 %
FEV6-%Change-Post: 3 %
FEV6-%Pred-Post: 80 %
FEV6-%Pred-Pre: 77 %
FEV6-Post: 2.21 L
FEV6-Pre: 2.14 L
FEV6FVC-%Pred-Post: 104 %
FEV6FVC-%Pred-Pre: 104 %
FVC-%Change-Post: 3 %
FVC-%Pred-Post: 76 %
FVC-%Pred-Pre: 73 %
FVC-Post: 2.21 L
FVC-Pre: 2.14 L
Post FEV1/FVC ratio: 81 %
Post FEV6/FVC ratio: 100 %
Pre FEV1/FVC ratio: 80 %
Pre FEV6/FVC Ratio: 100 %
RV % pred: 91 %
RV: 2.06 L
TLC % pred: 88 %
TLC: 4.46 L

## 2018-11-24 NOTE — Assessment & Plan Note (Signed)
She has snoring and also witnessed apneas.  Suspect that she does have obstructive sleep apnea.  A split-night sleep study has been ordered but she has not yet scheduled it.  She has had a lot going on her life and having gotten around to it.  She is still interested in doing so.  I asked her to let us know when she is ready to schedule and we will arrange.  I will then follow-up with her afterwards to review the results, plan CPAP therapy if indicated.

## 2018-11-24 NOTE — Assessment & Plan Note (Signed)
Her decreased functional capacity is multifactorial, but her explicit complaints of dyspnea seem to relate to restriction.  Her pulmonary function testing supports this.  I suspect that it is related to obesity.  There is no other evidence to support respiratory muscle weakness, no elevated hemidiaphragm.  PFTs also showed some mild obstruction.  Unclear whether she will benefit from a bronchodilator but I think it is reasonable to do a trial of albuterol to see if she gets benefit.

## 2018-11-24 NOTE — Patient Instructions (Addendum)
Your pulmonary function testing shows some restriction on the sides which brought you take. There is also some evidence for some mild abnormal airflow present.  You might benefit from using an albuterol inhaler, 2 puffs if you needed for shortness of breath.  You can try this to see if it helps your breathing symptoms.  Do not use any more frequently than every 4 hours. Agree that you likely have obstructive sleep apnea.  You need to have a split-night sleep study.  We will arrange for you when you are ready to get it done. Follow with Dr. Lamonte Sakai after you have the sleep study to review the results together.

## 2018-11-24 NOTE — Progress Notes (Signed)
PFT done today. 

## 2018-11-24 NOTE — Progress Notes (Signed)
Subjective:    Patient ID: Kayla [name], female    DOB: [birth year], 73 y.o.   MRN: [id number]  HPI Kayla [name] is 2, a never smoker with a history of fibromyalgia, GERD, hypertension with diastolic dysfunction, anemia, hypothyroidism, irritable bowel syndrome, costochondritis. She has been told that she might have asthma - unclear, over 15 yrs ago.   She is a self-referral today to evaluate exertional shortness of breath. She also notes that she believes that she has OSA - has been told that she snores and has abnormal breathing when sleeping. She describes exertional SOB  For a few years, has progressively worsened. She believes that she first noticed it when she gained wt. Happens with exertion, but also with sitting. Has to yawn to get an adequate breath. She describes some dysphagia, some difficulty swallowing pills. She is having a lot of sinus drainage, throat clearing but no cough. She has GERD sx as well - depends on what she eats. She can walk about 100 ft. She is able to shop with a cart.   CT-PA 05/22/2018 >> reviewed by me.  Shows a 4.2 cm thoracic aortic aneurysm.  No infiltrate.  Small hiatal hernia  ROV 11/24/2018 --this is a follow-up visit for further evaluation of exertional dyspnea.  Also for possible obstructive sleep apnea.  She carries a possible history of asthma.  She underwent pulmonary function testing today which I have reviewed and which shows mixed obstruction and restriction without a bronchodilator response.  There is some curve to her flow volume loop which is suggestive of obstruction.  Her lung volumes are normal as is her diffusion capacity.  She has not yet had a split-night sleep study.  Chest x-ray from 11/05/2018 showed no infiltrates. She continues to have episodes of weakness, feels at times that she might pass out. Unclear that this is actually dyspnea. She has some dysphagia, GERD, throat clearing but not a lot of cough. She was unfortunately admitted last month for  pyelonephritis and sepsis. She still feels some restriction taking a deep breath.     Review of Systems  HENT: Positive for trouble swallowing.   Respiratory: Positive for shortness of breath. Negative for chest tightness.   Cardiovascular: Negative for chest pain.  Gastrointestinal:       Acid Reflux  Musculoskeletal: Positive for arthralgias and joint swelling.  Skin: Negative for rash.  Neurological: Negative for headaches.  Psychiatric/Behavioral: The patient is nervous/anxious.     Past Medical History:  Diagnosis Date  . Anemia   . Asthma   . B12 deficiency   . Chronic LBP   . Diastolic dysfunction   . Fibromyalgia   . GERD (gastroesophageal reflux disease)   . Hyperlipemia   . Hypertension   . Hypothyroid   . IBS (irritable bowel syndrome)   . S/P cardiac cath 11/30/2007   normal coronaries - Dr. Burt Knack (Dr. Debara Pickett reviewed films on 01/10/2016)     Family History  Problem Relation Age of Onset  . Heart attack Father 82  . Lung cancer Other        uncle, non smoker  . Leukemia Other        uncle  . Cancer Mother        oral  . Stroke Mother   . Cancer Maternal Aunt        breast  . Alcohol abuse Maternal Aunt   . Alcohol abuse Maternal Uncle   . Cancer Maternal Uncle  lung, stomach, oral  . Cancer Paternal Uncle        lung  . Congestive Heart Failure Maternal Grandfather   . Heart attack Paternal Grandfather   . Cancer Paternal Uncle        bone  . Colon cancer Neg Hx   . Breast cancer Neg Hx   . Diabetes Neg Hx      Social History   Socioeconomic History  . Marital status: Widowed    Spouse name: Not on file  . Number of children: 2  . Years of education: Not on file  . Highest education level: Not on file  Occupational History  . Not on file  Social Needs  . Financial resource strain: Not on file  . Food insecurity    Worry: Not on file    Inability: Not on file  . Transportation needs    Medical: Not on file    Non-medical:  Not on file  Tobacco Use  . Smoking status: Never Smoker  . Smokeless tobacco: Never Used  Substance and Sexual Activity  . Alcohol use: No  . Drug use: No  . Sexual activity: Not on file  Lifestyle  . Physical activity    Days per week: Not on file    Minutes per session: Not on file  . Stress: Not on file  Relationships  . Social Herbalist on phone: Not on file    Gets together: Not on file    Attends religious service: Not on file    Active member of club or organization: Not on file    Attends meetings of clubs or organizations: Not on file    Relationship status: Not on file  . Intimate partner violence    Fear of current or ex partner: Not on file    Emotionally abused: Not on file    Physically abused: Not on file    Forced sexual activity: Not on file  Other Topics Concern  . Not on file  Social History Narrative   Lost husband 2009, lost mother 2016  Retired Education officer, museum Originally from [place]  . Ace Inhibitors Anaphylaxis    REACTION: glossal edema  . Lisinopril Anaphylaxis     Outpatient Medications Prior to Visit  Medication Sig Dispense Refill  . atorvastatin (LIPITOR) 20 MG tablet Take 1 tablet (20 mg total) by mouth daily. 12 tablet 0  . calcium-vitamin D (OSCAL WITH D) 250-125 MG-UNIT tablet Take 1 tablet by mouth daily.    . [facility] VITAMIN B12 1000 MCG tablet TAKE 1 TABLET (1,000 MCG TOTAL) BY MOUTH DAILY. 90 tablet 1  . ketoconazole (NIZORAL) 2 % shampoo Apply 1 application topically 2 (two) times a week. 120 mL 0  . levothyroxine (SYNTHROID) 125 MCG tablet Take 1 tablet (125 mcg total) by mouth daily before breakfast. 45 tablet 0  . MATZIM LA 360 MG 24 hr tablet TAKE 1 TABLET (360 MG TOTAL) BY MOUTH DAILY. MUST KEEP UPCOMING APPT FOR FURTHER REFILLS (Patient taking differently: Take 360 mg by mouth daily. ) 30 tablet 6  . phenazopyridine (PYRIDIUM) 100 MG tablet Take 1 tablet (100 mg total) by mouth 3  (three) times daily as needed for pain. 10 tablet 0  . Vitamin D, Ergocalciferol, (DRISDOL) 50000 units CAPS capsule TAKE ONE CAPSULE BY MOUTH ONE TIME PER WEEK (Patient taking differently: Take 50,000 Units by mouth every 7 (seven) days. ) 12 capsule 10  No facility-administered medications prior to visit.         Objective:   Physical Exam Vitals:   11/24/18 1600  BP: 134/80  Pulse: 76  SpO2: 97%  Weight: 216 lb (98 kg)  Height: 5\' 4"  (1.626 m)   Gen: Pleasant, obese woman, in no distress, a bit nervous, tangential  ENT: No lesions,  mouth clear,  oropharynx clear, no postnasal drip  Neck: No JVD, no stridor  Lungs: No use of accessory muscles, small breaths, no crackles or wheezing  Cardiovascular: RRR, heart sounds normal, no murmur or gallops, trace peripheral edema  Musculoskeletal: No deformities, no cyanosis or clubbing  Neuro: alert, awake, non focal  Skin: Warm, no lesions or rash      Assessment & Plan:  Dyspnea Her decreased functional capacity is multifactorial, but her explicit complaints of dyspnea seem to relate to restriction.  Her pulmonary function testing supports this.  I suspect that it is related to obesity.  There is no other evidence to support respiratory muscle weakness, no elevated hemidiaphragm.  PFTs also showed some mild obstruction.  Unclear whether she will benefit from a bronchodilator but I think it is reasonable to do a trial of albuterol to see if she gets benefit.  Snoring She has snoring and also witnessed apneas.  Suspect that she does have obstructive sleep apnea.  A split-night sleep study has been ordered but she has not yet scheduled it.  She has had a lot going on her life and having gotten around to it.  She is still interested in doing so.  I asked her to let us know when she is ready to schedule and we will arrange.  I will then follow-up with her afterwards to review the results, plan CPAP therapy if indicated.  Baltazar Apo, MD, PhD 11/24/2018, 4:27 PM [facility] [phone] or if no answer [phone]

## 2018-11-25 ENCOUNTER — Other Ambulatory Visit: Payer: Self-pay

## 2018-11-25 ENCOUNTER — Ambulatory Visit
Admission: EM | Admit: 2018-11-25 | Discharge: 2018-11-25 | Disposition: A | Discharge status: Home / Self Care | Payer: Medicare Other | Attending: Emergency Medicine | Admitting: Emergency Medicine

## 2018-11-25 DIAGNOSIS — N3001 Acute cystitis with hematuria: Secondary | ICD-10-CM | POA: Diagnosis present

## 2018-11-25 DIAGNOSIS — B9689 Other specified bacterial agents as the cause of diseases classified elsewhere: Secondary | ICD-10-CM | POA: Diagnosis not present

## 2018-11-25 LAB — POCT URINALYSIS DIP (MANUAL ENTRY)
Bilirubin, UA: NEGATIVE
Glucose, UA: NEGATIVE mg/dL
Ketones, POC UA: NEGATIVE mg/dL
Nitrite, UA: NEGATIVE
Protein Ur, POC: NEGATIVE mg/dL
Spec Grav, UA: 1.02 (ref 1.010–1.025)
Urobilinogen, UA: 0.2 E.U./dL
pH, UA: 6 (ref 5.0–8.0)

## 2018-11-25 MED ORDER — CEPHALEXIN 500 MG PO CAPS: 500.0000 mg | ORAL_CAPSULE | Freq: Two times a day (BID) | ORAL | 0 refills | Status: AC

## 2018-11-25 NOTE — ED Triage Notes (Signed)
Pt c/o burning on urination and pain since yesterday.

## 2018-11-25 NOTE — Discharge Instructions (Addendum)
Take antibiotic as prescribed. We will call you with your urine culture results if we need to change your antibiotic. Return for worsening symptoms, abdominal or back pain, chest pain, difficulty breathing, fever.

## 2018-11-25 NOTE — ED Provider Notes (Signed)
[name]    CSN: [id number] Arrival date & time: 11/25/18  1704      History   Chief Complaint Chief Complaint  Patient presents with  . Urinary Tract Infection    HPI Kayla [name] is a 73 y.o. female with history of sepsis second to pyelonephritis (July 2020), fibromyalgia, hypertension presenting for burning with urination, lower abdominal pressure, right-sided flank pain since yesterday.  Patient denies hematuria, fever.  Has not tried anything for her pain.  Patient states she is worried due to her history of sepsis, wants to make sure she is okay.  Of note, patient was seen for similar symptoms on 8/30, notes reviewed by me: Treated for acute cystitis with 7-day course of Keflex.  Patient states that she had complete resolution of symptoms for about 10 days.  Has urology appointment pending for 2 weeks from now.  Past Medical History:  Diagnosis Date  . Anemia   . Asthma   . B12 deficiency   . Chronic LBP   . Diastolic dysfunction   . Fibromyalgia   . GERD (gastroesophageal reflux disease)   . Hyperlipemia   . Hypertension   . Hypothyroid   . IBS (irritable bowel syndrome)   . S/P cardiac cath 11/30/2007   normal coronaries - Dr. Burt Knack (Dr. Debara Pickett reviewed films on 01/10/2016)    Patient Active Problem List   Diagnosis Date Noted  . Dysuria 11/02/2018  . Pyelonephritis 10/12/2018  . Acute pyelonephritis 10/12/2018  . Chronic maxillary sinusitis 07/07/2018  . Encounter for Medicare annual wellness exam 06/04/2018  . Snoring 06/01/2018  . Costochondritis 06/01/2018  . Family history of heart disease 06/01/2018  . Class 2 severe obesity due to excess calories with serious comorbidity and body mass index (BMI) of 38.0 to 38.9 in adult ([place]) 06/01/2018  . Hematuria 04/28/2017  . Multiple atypical skin moles 04/28/2017  . Swollen lymph nodes 04/28/2017  . Screening for colon cancer 04/28/2017  . Healthcare maintenance 04/28/2017  . Shakiness 11/14/2016   . Otitis media of left ear 09/11/2016  . Depression, recurrent ([place]) 07/16/2016  . Adjustment disorder with mixed anxiety and depressed mood 01/15/2016  . Fe Def Anemia 12/22/2015  . Scoliosis (and kyphoscoliosis), idiopathic 12/20/2015  . Osteopenia 12/20/2015  . PCP NOTES >>> 01/22/2015  . Fibromyalgia 08/02/2014  . Dyspnea 07/07/2014  . GAD (generalized anxiety disorder) 09/17/2012  . UTI (lower urinary tract infection) 01/24/2012  . Vitamin D deficiency 01/24/2012  . Insomnia 01/24/2012  . Fatigue 10/14/2011  . Neck pain 07/23/2010  . Screening for breast cancer 07/23/2010  . B12 deficiency   . Hyperlipidemia 11/30/2007  . Coronary artery disease involving native heart without angina pectoris 11/30/2007  . PALPITATIONS, OCCASIONAL 04/15/2007  . Thyroid disease 01/27/2007  . Essential hypertension 01/27/2007  . Reactive airway disease that is not asthma 01/27/2007  . GERD 01/27/2007  . IBS 01/27/2007  . BACK PAIN, CHRONIC 01/27/2007    Past Surgical History:  Procedure Laterality Date  . CESAREAN SECTION    . CHOLECYSTECTOMY    . NECK SURGERY  1998    OB History   No obstetric history on file.      Home Medications    Prior to Admission medications   Medication Sig Start Date End Date Taking? Authorizing Provider  atorvastatin (LIPITOR) 20 MG tablet Take 1 tablet (20 mg total) by mouth daily. 11/04/18   Danford, Berna Spare, NP  calcium-vitamin D (OSCAL WITH D) 250-125 MG-UNIT tablet Take  1 tablet by mouth daily.    [provider]  cephALEXin (KEFLEX) 500 MG capsule Take 1 capsule (500 mg total) by mouth 2 (two) times daily for 7 days. 11/25/18 12/02/18  Hall-Potvin, [place], PA-C  [facility] VITAMIN B12 1000 MCG tablet TAKE 1 TABLET (1,000 MCG TOTAL) BY MOUTH DAILY. 03/24/18   Danford, Valetta Fuller D, NP  ketoconazole (NIZORAL) 2 % shampoo Apply 1 application topically 2 (two) times a week. 10/30/17   Mina Marble D, NP  levothyroxine (SYNTHROID) 125 MCG tablet Take 1  tablet (125 mcg total) by mouth daily before breakfast. 11/03/18   Danford, Valetta Fuller D, NP  MATZIM LA 360 MG 24 hr tablet TAKE 1 TABLET (360 MG TOTAL) BY MOUTH DAILY. MUST KEEP UPCOMING APPT FOR FURTHER REFILLS Patient taking differently: Take 360 mg by mouth daily.  07/06/18   Hilty, Nadean Corwin, MD  phenazopyridine (PYRIDIUM) 100 MG tablet Take 1 tablet (100 mg total) by mouth 3 (three) times daily as needed for pain. 11/02/18   Danford, Valetta Fuller D, NP  Vitamin D, Ergocalciferol, (DRISDOL) 50000 units CAPS capsule TAKE ONE CAPSULE BY MOUTH ONE TIME PER WEEK Patient taking differently: Take 50,000 Units by mouth every 7 (seven) days.  11/27/17   [name], NP    Family History Family History  Problem Relation Age of Onset  . Heart attack Father 41  . Lung cancer Other        uncle, non smoker  . Leukemia Other        uncle  . Cancer Mother        oral  . Stroke Mother   . Cancer Maternal Aunt        breast  . Alcohol abuse Maternal Aunt   . Alcohol abuse Maternal Uncle   . Cancer Maternal Uncle        lung, stomach, oral  . Cancer Paternal Uncle        lung  . Congestive Heart Failure Maternal Grandfather   . Heart attack Paternal Grandfather   . Cancer Paternal Uncle        bone  . Colon cancer Neg Hx   . Breast cancer Neg Hx   . Diabetes Neg Hx     Social History Social History   Tobacco Use  . Smoking status: Never Smoker  . Smokeless tobacco: Never Used  Substance Use Topics  . Alcohol use: No  . Drug use: No     Allergies   Ace inhibitors and Lisinopril   Review of Systems Review of Systems  Constitutional: Negative for fatigue and fever.  Respiratory: Negative for cough and shortness of breath.   Cardiovascular: Negative for chest pain and palpitations.  Gastrointestinal: Negative for constipation and diarrhea.  Genitourinary: Positive for dysuria, frequency and urgency. Negative for flank pain, hematuria, pelvic pain, vaginal bleeding, vaginal discharge and  vaginal pain.  Musculoskeletal: Positive for back pain. Negative for gait problem, joint swelling and neck pain.     Physical Exam Triage Vital Signs ED Triage Vitals [11/25/18 1712]  Enc Vitals Group     BP (!) 136/92     Pulse Rate 69     Resp 18     Temp 98.3 F (36.8 C)     Temp Source Oral     SpO2 96 %     Weight      Height      Head Circumference      Peak Flow      Pain  Score      Pain Loc      Pain Edu?      Excl. in [place]?    No data found.  Updated Vital Signs BP (!) 136/92 (BP Location: Left Arm)   Pulse 69   Temp 98.3 F (36.8 C) (Oral)   Resp 18   SpO2 96%   Visual Acuity Right Eye Distance:   Left Eye Distance:   Bilateral Distance:    Right Eye Near:   Left Eye Near:    Bilateral Near:     Physical Exam Constitutional:      General: She is not in acute distress. HENT:     Head: Normocephalic and atraumatic.  Eyes:     General: No scleral icterus.    Pupils: Pupils are equal, round, and reactive to light.  Cardiovascular:     Rate and Rhythm: Normal rate and regular rhythm.  Pulmonary:     Effort: Pulmonary effort is normal. No respiratory distress.     Breath sounds: No wheezing.  Abdominal:     General: Bowel sounds are normal.     Palpations: Abdomen is soft.     Tenderness: There is no abdominal tenderness. There is no right CVA tenderness, left CVA tenderness or guarding.  Skin:    Coloration: Skin is not jaundiced or pale.  Neurological:     Mental Status: She is alert and oriented to person, place, and time.      UC Treatments / Results  Labs (all labs ordered are listed, but only abnormal results are displayed) Labs Reviewed  POCT URINALYSIS DIP (MANUAL ENTRY) - Abnormal; Notable for the following components:      Result Value   Blood, UA trace-intact (*)    Leukocytes, UA Small (1+) (*)    All other components within normal limits  URINE CULTURE    EKG   Radiology No results found.  Procedures Procedures  (including critical care time)  Medications Ordered in UC Medications - No data to display  Initial Impression / Assessment and Plan / UC Course  I have reviewed the triage vital signs and the nursing notes.  Pertinent labs & imaging results that were available during my care of the patient were reviewed by me and considered in my medical decision making (see chart for details).     1.  Acute cystitis with hematuria Urinalysis done in office, reviewed by me: Significant for small leuks, trace hemoglobin.  Will treat with Keflex as this completely resolved symptoms in the past.  Urine culture pending.  Patient to keep appoint with urology.  Return precautions discussed, patient verbalized understanding and is agreeable to plan. Final Clinical Impressions(s) / UC Diagnoses   Final diagnoses:  Acute cystitis with hematuria     Discharge Instructions     Take antibiotic as prescribed. We will call you with your urine culture results if we need to change your antibiotic. Return for worsening symptoms, abdominal or back pain, chest pain, difficulty breathing, fever.   ED Prescriptions    Medication Sig Dispense Auth. Provider   cephALEXin (KEFLEX) 500 MG capsule Take 1 capsule (500 mg total) by mouth 2 (two) times daily for 7 days. 14 capsule Hall-Potvin, [place], PA-C     Controlled Substance Prescriptions [place] Controlled Substance Registry consulted? Not Applicable   Quincy Sheehan, [place] 11/25/18 1755

## 2018-11-26 LAB — URINE CULTURE

## 2018-11-30 ENCOUNTER — Other Ambulatory Visit: Payer: Self-pay | Admitting: Gastroenterology

## 2018-11-30 DIAGNOSIS — R1310 Dysphagia, unspecified: Secondary | ICD-10-CM

## 2018-12-07 ENCOUNTER — Encounter: Payer: Self-pay | Admitting: Neurology

## 2018-12-07 ENCOUNTER — Other Ambulatory Visit: Payer: Self-pay

## 2018-12-07 ENCOUNTER — Ambulatory Visit (INDEPENDENT_AMBULATORY_CARE_PROVIDER_SITE_OTHER): Payer: Medicare Other | Admitting: Neurology

## 2018-12-07 VITALS — BP 151/95 | HR 64 | Temp 98.0°F | Ht 63.5 in | Wt 211.0 lb

## 2018-12-07 DIAGNOSIS — R413 Other amnesia: Secondary | ICD-10-CM | POA: Diagnosis not present

## 2018-12-07 DIAGNOSIS — R4701 Aphasia: Secondary | ICD-10-CM | POA: Diagnosis not present

## 2018-12-07 DIAGNOSIS — G8929 Other chronic pain: Secondary | ICD-10-CM

## 2018-12-07 DIAGNOSIS — R5383 Other fatigue: Secondary | ICD-10-CM | POA: Diagnosis not present

## 2018-12-07 DIAGNOSIS — R479 Unspecified speech disturbances: Secondary | ICD-10-CM

## 2018-12-07 NOTE — Patient Instructions (Addendum)
Healthy Brain Study at [facility] M.I.N.D diet from [facility] Consider Dr. Sima Matas Formal Memory Testing Labs today CT of the head Follow up in 6 months to a year and let us follow you   Discussed: Recommendations to prevent or slow progression of cognitive decline:   Exercise You should increase exercise 30 to 45 minutes per day at least 3 days a week although 5 to 7 would be preferred. Any type of exercise (including walking) is acceptable although a recumbent bicycle may be best if you are unsteady. Disease related apathy can be a significant roadblock to exercise and the only way to overcome this is to make it a daily routine and perhaps have a reward at the end (something your loved one loves to eat or drink perhaps) or a personal trainer coming to the home can also be very useful. In general a structured, repetitive schedule is best.   Cardiovascular Health: You should optimize all cardiovascular risk factors (blood pressure, sugar, cholesterol) as vascular disease such as strokes and heart attacks can make memory problems much worse.   Diet: Eating a heart healthy (Mediterranean) diet is also a good idea; fish and poultry instead of red meat, nuts (mostly non-peanuts), vegetables, fruits, olive oil or canola oil (instead of butter), minimal salt (use other spices to flavor foods), whole grain rice, bread, cereal and pasta and wine in moderation.  General Health: Any diseases which effect your body will effect your brain such as a pneumonia, urinary infection, blood clot, heart attack or stroke. Keep contact with your primary care doctor for regular follow ups.  Sleep. A good nights sleep is healthy for the brain. Seven hours is recommended. If you have insomnia or poor sleep habits see the recommendations below

## 2018-12-07 NOTE — Progress Notes (Signed)
[facility]    Provider:  Dr Jaynee Eagles Requesting Provider: [name], NP Primary Care Provider:  [name], NP  CC:  Memory loss (Desnies wanting to discuss headaches today)  HPI:  Kayla [name] is a 73 y.o. female here as requested by [name], NP for headaches. PMHx fibromyalgia, gerd, hld, htn, hypothyroid, anxiety and depression, ascending aortic aneurysm, she says she is not here for headache but for concerns of memory. Also started losing hearing in the left ear. Also felt a know in her neck. She does not want to age, she does not want to have normal cognitive aging. She sees a big difference, she does sudoku, she loves politics, her mother had dementia. She loves to learn, she is a "bibliophile", she reads a lot, sometimes some word finding difficulty. She may repeat things because her son will say he already told her but she thinks it is her son he is disabled. She feels she loses her words but she is a good historian, she cares for her son, she performs all her ADLs and IADLs.  Mother lived to be 34 an dthen diagnosed with Alzheimers. No other focal neurologic deficits, associated symptoms, inciting events or modifiable factors.  Reviewed notes, labs and imaging from outside physicians, which showed:  Reviewed  MRI brain 2000 report:  THERE IS NO EVIDENCE OF A CEREBELLOPONTINE ANGLE MASS.   THE CAROTID ARTERIES HAVE A SYMMETRICAL APPEARANCE.   THERE IS NO EVIDENCE OF AN INFARCT.   NO EVIDENCE OF ABNORMAL INTRACRANIAL ENHANCING LESION.   NO EVIDENCE OF ABNORMAL SIGNAL INTENSITY TO SUGGEST A DEMYELINATING PROCESS.   THERE IS A PARTIALLY EMPTY SELLA.   THE PINEAL REGION AND CERVICOMEDULLARY JUNCTION ARE UNREMARKABLE.   THE MAJOR VASCULAR STRUCTURES ARE PATENT. THE ORBITAL STRUCTURES ARE WITHIN NORMAL LIMITS.  NO EVIDENCE OF SKULL BASE ABNORMALITY.   VISUALIZED SINUSES AND MASTOID AIR CELLS ARE CLEAR. IMPRESSION MR OF THE BRAIN REVEALS A PARTIAL EMPTY SELLA BUT  IS OTHERWISE UNREMARKABLE.  Review of Systems: Patient complains of symptoms per HPI as well as the following symptoms: memory . Pertinent negatives and positives per HPI. All others negative.   Social History   Socioeconomic History   Marital status: Widowed    Spouse name: Not on file   Number of children: 2   Years of education: Not on file   Highest education level: Bachelor's degree (e.g., BA, AB, BS)  Occupational History   Not on file  Social Needs   Financial resource strain: Not on file   Food insecurity    Worry: Not on file    Inability: Not on file   Transportation needs    Medical: Not on file    Non-medical: Not on file  Tobacco Use   Smoking status: Never Smoker   Smokeless tobacco: Never Used  Substance and Sexual Activity   Alcohol use: No   Drug use: No   Sexual activity: Not on file  Lifestyle   Physical activity    Days per week: Not on file    Minutes per session: Not on file   Stress: Not on file  Relationships   Social connections    Talks on phone: Not on file    Gets together: Not on file    Attends religious service: Not on file    Active member of club or organization: Not on file    Attends meetings of clubs or organizations: Not on file  Relationship status: Not on file   Intimate partner violence    Fear of current or ex partner: Not on file    Emotionally abused: Not on file    Physically abused: Not on file    Forced sexual activity: Not on file  Other Topics Concern   Not on file  Social History Narrative   Lost husband 2009, lost mother 2016      Lives at home (son & 3 grandchildren live with her)   Caffeine: 10-12 oz daily    Family History  Problem Relation Age of Onset   Heart attack Father 73   Lung cancer Other        uncle, non smoker   Leukemia Other        uncle   Cancer Mother        oral   Stroke Mother    Alzheimer's disease Mother        at 56   Cancer Maternal Aunt         breast   Alcohol abuse Maternal Aunt    Alcohol abuse Maternal Uncle    Cancer Maternal Uncle        lung, stomach, oral   Cancer Paternal Uncle        lung   Congestive Heart Failure Maternal Grandfather    Heart disease Maternal Grandfather    Heart attack Paternal Grandfather    Cancer Paternal Uncle        bone   Brain cancer Maternal Grandmother    Alzheimer's disease Other        maternal great grandmother    Colon cancer Neg Hx    Breast cancer Neg Hx    Diabetes Neg Hx     Past Medical History:  Diagnosis Date   Anemia    Anxiety    Asthma    B12 deficiency    Bursitis    Chronic LBP    Chronic UTI    Diastolic dysfunction    Fibromyalgia    GERD (gastroesophageal reflux disease)    Hyperlipemia    Hypertension    Hypothyroid    IBS (irritable bowel syndrome)    Inflammatory osteoarthritis    Kidney infection    Psoriatic arthritis ([place])    S/P cardiac cath 11/30/2007   normal coronaries - Dr. Burt Knack (Dr. Debara Pickett reviewed films on 01/10/2016)    Patient Active Problem List   Diagnosis Date Noted   Dysuria 11/02/2018   Pyelonephritis 10/12/2018   Acute pyelonephritis 10/12/2018   Chronic maxillary sinusitis 07/07/2018   Encounter for Medicare annual wellness exam 06/04/2018   Snoring 06/01/2018   Costochondritis 06/01/2018   Family history of heart disease 06/01/2018   Class 2 severe obesity due to excess calories with serious comorbidity and body mass index (BMI) of 38.0 to 38.9 in adult [facility]) 06/01/2018   Hematuria 04/28/2017   Multiple atypical skin moles 04/28/2017   Swollen lymph nodes 04/28/2017   Screening for colon cancer 04/28/2017   Healthcare maintenance 04/28/2017   Shakiness 11/14/2016   Otitis media of left ear 09/11/2016   Depression, recurrent ([place]) 07/16/2016   Adjustment disorder with mixed anxiety and depressed mood 01/15/2016   Fe Def Anemia 12/22/2015   Scoliosis (and  kyphoscoliosis), idiopathic 12/20/2015   Osteopenia 12/20/2015   PCP NOTES >>> 01/22/2015   Fibromyalgia 08/02/2014   Dyspnea 07/07/2014   GAD (generalized anxiety disorder) 09/17/2012   UTI (lower urinary tract infection) 01/24/2012   Vitamin D deficiency  01/24/2012   Insomnia 01/24/2012   Fatigue 10/14/2011   Neck pain 07/23/2010   Screening for breast cancer 07/23/2010   B12 deficiency    Hyperlipidemia 11/30/2007   Coronary artery disease involving native heart without angina pectoris 11/30/2007   PALPITATIONS, OCCASIONAL 04/15/2007   Thyroid disease 01/27/2007   Essential hypertension 01/27/2007   Reactive airway disease that is not asthma 01/27/2007   GERD 01/27/2007   IBS 01/27/2007   BACK PAIN, CHRONIC 01/27/2007    Past Surgical History:  Procedure Laterality Date   CESAREAN SECTION  06/10/1975   CHOLECYSTECTOMY     NECK SURGERY  1998   repair broken C6 & C7      Current Outpatient Medications  Medication Sig Dispense Refill   acetaminophen (TYLENOL) 500 MG tablet Take 500 mg by mouth 4 (four) times daily.     Calcium Carbonate-Vitamin D (CALCIUM-D PO) Take by mouth daily. Calcium 600 and D3 5 mcg     Calcium-Magnesium-Zinc (CAL-MAG-ZINC PO) Take by mouth 2 (two) times daily.     CVS VITAMIN B12 1000 MCG tablet TAKE 1 TABLET (1,000 MCG TOTAL) BY MOUTH DAILY. 90 tablet 1   ketoconazole (NIZORAL) 2 % shampoo Apply 1 application topically 2 (two) times a week. 120 mL 0   levothyroxine (SYNTHROID) 125 MCG tablet Take 1 tablet (125 mcg total) by mouth daily before breakfast. 45 tablet 0   MATZIM LA 360 MG 24 hr tablet TAKE 1 TABLET (360 MG TOTAL) BY MOUTH DAILY. MUST KEEP UPCOMING APPT FOR FURTHER REFILLS (Patient taking differently: Take 360 mg by mouth daily. ) 30 tablet 6   omeprazole (PRILOSEC) 40 MG capsule Take 40 mg by mouth daily.     pyridOXINE (VITAMIN B-6) 100 MG tablet Take 100 mg by mouth daily.     atorvastatin (LIPITOR)  20 MG tablet Take 1 tablet (20 mg total) by mouth daily. (Patient not taking: Reported on 12/07/2018) 12 tablet 0   phenazopyridine (PYRIDIUM) 100 MG tablet Take 1 tablet (100 mg total) by mouth 3 (three) times daily as needed for pain. (Patient not taking: Reported on 12/07/2018) 10 tablet 0   Vitamin D, Ergocalciferol, (DRISDOL) 50000 units CAPS capsule TAKE ONE CAPSULE BY MOUTH ONE TIME PER WEEK (Patient not taking: No sig reported) 12 capsule 10   No current facility-administered medications for this visit.     Allergies as of 12/07/2018 - Review Complete 12/07/2018  Allergen Reaction Noted   Ace inhibitors Anaphylaxis 05/12/2006   Lisinopril Anaphylaxis 07/05/2016    Vitals: BP (!) 151/95 (BP Location: Left Arm, Patient Position: Sitting)    Pulse 64    Temp 98 F (36.7 C) Comment: taken by check-in staff   Ht 5' 3.5" (1.613 m)    Wt 211 lb (95.7 kg)    BMI 36.79 kg/m  Last Weight:  Wt Readings from Last 1 Encounters:  12/07/18 211 lb (95.7 kg)   Last Height:   Ht Readings from Last 1 Encounters:  12/07/18 5' 3.5" (1.613 m)     Physical exam: Exam: Gen: NAD, conversant, well nourised, obese, well groomed                     CV: RRR, no MRG. No Carotid Bruits. No peripheral edema, warm, nontender Eyes: Conjunctivae clear without exudates or hemorrhage  Neuro: Detailed Neurologic Exam  Speech:    Speech is normal; fluent and spontaneous with normal comprehension.  Cognition:    The patient is oriented to  person, place, and time;     recent and remote memory intact;     language fluent;     normal attention, concentration,     fund of knowledge Cranial Nerves:    The pupils are equal, round, and reactive to light. Attempted fundoscopic exam . Visual fields are full to finger confrontation. Extraocular movements are intact. Trigeminal sensation is intact and the muscles of mastication are normal. The face is symmetric. The palate elevates in the midline. Hearing  intact. Voice is normal. Shoulder shrug is normal. The tongue has normal motion without fasciculations.   Coordination:    No dysmetria   Gait:    Not ataxic  Motor Observation:    No asymmetry, no atrophy, and no involuntary movements noted. Tone:    Normal muscle tone.    Posture:    Posture is normal. normal erect    Strength:    Strength is equal and symmetric in the upper and lower limbs.      Sensation: intact to LT     Reflex Exam:  DTR's:    Deep tendon reflexes in the upper and lower extremities are symmetrical bilaterally.   Toes:    The toes are equiv bilaterally.   Clonus:    Clonus is absent.    Assessment/Plan:   73 y.o. female here as requested by [name], NP for headaches. PMHx fibromyalgia, gerd, hld, htn, hypothyroid, anxiety and depression, ascending aortic aneurysm, she says she is not here for headache but for concerns of memory. She is worried that in the future she will develop alzheimers and doesn't want to have normal cognitive aging she wants to be as sharp as ever. There is no evidence for cognitive impairment in this 73 year old patient, we discussed prevention of dementia, diet, managing vascular risk factors, Diet, exercise, staying social. I suspect there may be anxiety over her mother's condition, she has chronic pain, poor sleep contributing as well, but given extensive family history mother and mother's siblings can send to Dr. Sima Matas in the future but I would want to follow her first and see if she declines bc I suspect other causes for her symptoms. She cannot tolerate MRI I will order CT head.She is welcome to come back to discuss headaches and neck pain if she likes.  Discussed: Recommendations to prevent or slow progression of cognitive decline:   Exercise You should increase exercise 30 to 45 minutes per day at least 3 days a week although 5 to 7 would be preferred. Any type of exercise (including walking) is acceptable although a  recumbent bicycle may be best if you are unsteady. Disease related apathy can be a significant roadblock to exercise and the only way to overcome this is to make it a daily routine and perhaps have a reward at the end (something your loved one loves to eat or drink perhaps) or a personal trainer coming to the home can also be very useful. In general a structured, repetitive schedule is best.   Cardiovascular Health: You should optimize all cardiovascular risk factors (blood pressure, sugar, cholesterol) as vascular disease such as strokes and heart attacks can make memory problems much worse.   Diet: Eating a heart healthy (Mediterranean) diet is also a good idea; fish and poultry instead of red meat, nuts (mostly non-peanuts), vegetables, fruits, olive oil or canola oil (instead of butter), minimal salt (use other spices to flavor foods), whole grain rice, bread, cereal and pasta and wine in  moderation.  General Health: Any diseases which effect your body will effect your brain such as a pneumonia, urinary infection, blood clot, heart attack or stroke. Keep contact with your primary care doctor for regular follow ups.  Sleep. A good nights sleep is healthy for the brain. Seven hours is recommended. If you have insomnia or poor sleep habits see the recommendations below  Orders Placed This Encounter  Procedures   CT HEAD WO CONTRAST   B12 and Folate Panel   Vitamin B1   Homocysteine   Methylmalonic acid, serum   Vitamin D, 25-hydroxy    Cc: Danford, Berna Spare, NP,    Sarina Ill, MD  [facility] [address] [place], [place] [address]  Phone [phone] Fax [phone]

## 2018-12-10 ENCOUNTER — Ambulatory Visit
Admission: RE | Admit: 2018-12-10 | Discharge: 2018-12-10 | Disposition: A | Discharge status: Home / Self Care | Payer: Medicare Other | Source: Ambulatory Visit | Attending: Gastroenterology | Admitting: Gastroenterology

## 2018-12-10 DIAGNOSIS — R1310 Dysphagia, unspecified: Secondary | ICD-10-CM

## 2018-12-11 ENCOUNTER — Encounter: Payer: Self-pay | Admitting: Cardiology

## 2018-12-11 LAB — VITAMIN B1: Thiamine: 146.6 nmol/L (ref 66.5–200.0)

## 2018-12-11 LAB — B12 AND FOLATE PANEL
Folate: 5.6 ng/mL (ref >3.0)
Vitamin B-12: 421 pg/mL (ref 232–1245)

## 2018-12-11 LAB — METHYLMALONIC ACID, SERUM: Methylmalonic Acid: 201 nmol/L (ref 0–378)

## 2018-12-11 LAB — HOMOCYSTEINE: Homocysteine: 30 umol/L — ABNORMAL HIGH (ref 0.0–19.2)

## 2018-12-11 LAB — VITAMIN D 25 HYDROXY (VIT D DEFICIENCY, FRACTURES): Vit D, 25-Hydroxy: 34.1 ng/mL (ref 30.0–100.0)

## 2018-12-15 ENCOUNTER — Other Ambulatory Visit: Payer: Self-pay

## 2018-12-15 ENCOUNTER — Ambulatory Visit (INDEPENDENT_AMBULATORY_CARE_PROVIDER_SITE_OTHER): Payer: Medicare Other | Admitting: Cardiology

## 2018-12-15 ENCOUNTER — Encounter: Payer: Self-pay | Admitting: Cardiology

## 2018-12-15 VITALS — BP 152/81 | HR 71 | Ht 63.5 in | Wt 213.0 lb

## 2018-12-15 DIAGNOSIS — Z79899 Other long term (current) drug therapy: Secondary | ICD-10-CM | POA: Diagnosis not present

## 2018-12-15 DIAGNOSIS — R002 Palpitations: Secondary | ICD-10-CM

## 2018-12-15 DIAGNOSIS — E782 Mixed hyperlipidemia: Secondary | ICD-10-CM

## 2018-12-15 DIAGNOSIS — I10 Essential (primary) hypertension: Secondary | ICD-10-CM

## 2018-12-15 DIAGNOSIS — Z8249 Family history of ischemic heart disease and other diseases of the circulatory system: Secondary | ICD-10-CM

## 2018-12-15 DIAGNOSIS — R0789 Other chest pain: Secondary | ICD-10-CM

## 2018-12-15 DIAGNOSIS — I2510 Atherosclerotic heart disease of native coronary artery without angina pectoris: Secondary | ICD-10-CM | POA: Diagnosis not present

## 2018-12-15 MED ORDER — DILTIAZEM HCL ER COATED BEADS 360 MG PO TB24: 360.0000 mg | ORAL_TABLET | Freq: Every day | ORAL | 11 refills | Status: DC

## 2018-12-15 MED ORDER — ASPIRIN EC 81 MG PO TBEC: 81.0000 mg | DELAYED_RELEASE_TABLET | Freq: Every day | ORAL | Status: DC

## 2018-12-15 NOTE — Patient Instructions (Signed)
Medication Instructions:  Start: Aspirin 81 mg daily  If you need a refill on your cardiac medications before your next appointment, please call your pharmacy.   Lab work: Your physician recommends that you return for lab work in 3 months (lipid)    Testing/Procedures: None  Follow-Up: At [facility], you and your health needs are our priority.  As part of our continuing mission to provide you with exceptional heart care, we have created designated Provider Care Teams.  These Care Teams include your primary Cardiologist (physician) and Advanced Practice Providers (APPs -  Physician Assistants and Nurse Practitioners) who all work together to provide you with the care you need, when you need it. You will need a follow up appointment in 3 months.  Please call our office 2 months in advance to schedule this appointment.  You may see Buford Dresser, MD or one of the following Advanced Practice Providers on your designated Care Team:   Rosaria Ferries, PA-C . Jory Sims, DNP, ANP

## 2018-12-15 NOTE — Progress Notes (Signed)
Cardiology Office Note:    Date:  12/15/2018   ID:  Kayla [name], DOB 04-20-1945, MRN [id number]  PCP:  [name], NP  Cardiologist:  Buford Dresser, MD PhD  Referring MD: Mina Marble D, NP   CC: follow up  History of Present Illness:    Kayla [name] is a 73 y.o. female with a hx of mild CAD, HTN, HLD, IBS, hypothyroid, GERD, fibromyalgia, OA who is seen for follow up today. I first met her on 06/01/18. Seen initially by Rosaria Ferries 03/26/18 for hypertension and chest discomfort.  Today: Brings a notebook with her today, reviewed page of questions. -in the hospital in 10/2018 for sepsis due to acute bilateral pyelonephritis (E. Coli). Reviewed this hospitalization. She was very frustrated about this. Very concerned about any medications that might  -has had issues with thyroid medications; was on high dose medication at the time of our visit. Has since been dropped to 125 mcg daily, feeling better. -awaiting copy of her thyroid results from Dr. Forde Dandy -spoke with urology re: urinary frequency. Ordered for [employer]. 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 Causing her a lot of pain, difficulty sleeping -went to neurologist (Dr. Jaynee Eagles) on 12/07/18. Was having headaches, neck soreness, shoulder pain, arm pain. Tender when she touches it, sore along her muscles. -has pulse ox. Pulse is 50-60s on pulse ox while she feels like her heart is vibrating. She is reassured by this  -reviewed pill and food dysphagia. Recently had esophagus study 12/10/18. Following with Dr. Penelope Coop for this -very distraught about her homocysteine lab being abnormal. She is concerned that she already takes B vitamins, wonders why her level was off.  -still very concerned given her dad died suddenly at age 64, though mother lived until 40. -plans to pursue sleep study with pulmonary physician -saw an ad for "the artery pill." Looked it  up. K2, omega 3, D3. Discussed vascepa, she doesn't yet meet indications but we will continue to follow this.    Past Medical History:  Diagnosis Date  . Anemia   . Anxiety   . Asthma   . B12 deficiency   . Bursitis   . Chronic LBP   . Chronic UTI   . Diastolic dysfunction   . Fibromyalgia   . GERD (gastroesophageal reflux disease)   . Hyperlipemia   . Hypertension   . Hypothyroid   . IBS (irritable bowel syndrome)   . Inflammatory osteoarthritis   . Kidney infection   . Psoriatic arthritis ([place])   . S/P cardiac cath 11/30/2007   normal coronaries - Dr. Burt Knack (Dr. Debara Pickett reviewed films on 01/10/2016)    Past Surgical History:  Procedure Laterality Date  . CESAREAN SECTION  06/10/1975  . CHOLECYSTECTOMY    . NECK SURGERY  1998  . repair broken C6 & C7      Current Medications: Current Outpatient Medications on File Prior to Visit  Medication Sig  . acetaminophen (TYLENOL) 500 MG tablet Take 500 mg by mouth 4 (four) times daily.  . Calcium Carbonate-Vitamin D (CALCIUM-D PO) Take by mouth daily. Calcium 600 and D3 5 mcg  . Calcium-Magnesium-Zinc (CAL-MAG-ZINC PO) Take by mouth 2 (two) times daily.  . [employer] VITAMIN B12 1000 MCG tablet TAKE 1 TABLET (1,000 MCG TOTAL) BY MOUTH DAILY.  Marland Kitchen ketoconazole (NIZORAL) 2 % shampoo Apply 1 application topically 2 (two) times a week.  Marland Kitchen  levothyroxine (SYNTHROID) 125 MCG tablet Take 1 tablet (125 mcg total) by mouth daily before breakfast.  . omeprazole (PRILOSEC) 40 MG capsule Take 40 mg by mouth daily.  Marland Kitchen pyridOXINE (VITAMIN B-6) 100 MG tablet Take 100 mg by mouth daily.  . Vitamin D, Ergocalciferol, (DRISDOL) 50000 units CAPS capsule TAKE ONE CAPSULE BY MOUTH ONE TIME PER WEEK   No current facility-administered medications on file prior to visit.      Allergies:   Ace inhibitors and Lisinopril   Social History   Socioeconomic History  . Marital status: Widowed    Spouse name: Not on file  . Number of children: 2  . Years of  education: Not on file  . Highest education level: Bachelor's degree (e.g., BA, AB, BS)  Occupational History  . Not on file  Social Needs  . Financial resource strain: Not on file  . Food insecurity    Worry: Not on file    Inability: Not on file  . Transportation needs    Medical: Not on file    Non-medical: Not on file  Tobacco Use  . Smoking status: Never Smoker  . Smokeless tobacco: Never Used  Substance and Sexual Activity  . Alcohol use: No  . Drug use: No  . Sexual activity: Not on file  Lifestyle  . Physical activity    Days per week: Not on file    Minutes per session: Not on file  . Stress: Not on file  Relationships  . Social Herbalist on phone: Not on file    Gets together: Not on file    Attends religious service: Not on file    Active member of club or organization: Not on file    Attends meetings of clubs or organizations: Not on file    Relationship status: Not on file  Other Topics Concern  . Not on file  Social History Narrative   Lost husband 2009, lost mother 2016      Lives at home (son & 3 grandchildren live with her)   Caffeine: 10-12 oz daily     Family History: The patient's family history includes Alcohol abuse in her maternal aunt and maternal uncle; Alzheimer's disease in her mother and another family member; Brain cancer in her maternal grandmother; Cancer in her maternal aunt, maternal uncle, mother, paternal uncle, and paternal uncle; Congestive Heart Failure in her maternal grandfather; Heart attack in her paternal grandfather; Heart attack (age of onset: 9) in her father; Heart disease in her maternal grandfather; Leukemia in an other family member; Lung cancer in an other family member; Stroke in her mother. There is no history of Colon cancer, Breast cancer, or Diabetes.  ROS:   Please see the history of present illness.  Additional pertinent ROS: Review of Systems  Constitutional: Positive for malaise/fatigue and weight  loss. Negative for chills and fever.  HENT: Negative for ear pain and hearing loss.   Eyes: Negative for double vision and pain.  Respiratory: Positive for shortness of breath. Negative for hemoptysis, sputum production and wheezing.   Cardiovascular: Positive for chest pain and leg swelling. Negative for orthopnea, claudication and PND.  Gastrointestinal: Positive for abdominal pain and heartburn. Negative for blood in stool and melena.  Genitourinary: Positive for frequency. Negative for dysuria, flank pain and hematuria.  Musculoskeletal: Positive for back pain, joint pain, myalgias and neck pain.  Skin: Negative for itching and rash.  Neurological: Positive for headaches. Negative for speech change, focal  weakness, seizures and loss of consciousness.  Endo/Heme/Allergies: Does not bruise/bleed easily.   EKGs/Labs/Other Studies Reviewed:    The following studies were reviewed today: CTA 06-Jun-2018 1. 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 Technically, current recommendations call for annual follow-up imaging for thoracic aneurysms of 4 cm or larger. However, 10 year stability of the current aneurysm is certainly reassuring. Aortic aneurysm NOS (ICD10-I71.9), Aortic Atherosclerosis (ICD10-170.0) 2. Small hiatal hernia.   Echo 04/29/18 Left ventricle: The cavity size was normal. There was mild   concentric hypertrophy. Systolic function was vigorous. The   estimated ejection fraction was in the range of 65% to 70%. Wall   motion was normal; there were no regional wall motion   abnormalities. Doppler parameters are consistent with abnormal   left ventricular relaxation (grade 1 diastolic dysfunction). - Aortic valve: Valve area (VTI): 4.83 cm^2. Valve area (Vmax):   4.21 cm^2. Valve area (Vmean): 4.76 cm^2. - Left atrium: The  atrium was mildly dilated. - Right ventricle: The cavity size was normal. Wall thickness was   normal. Systolic function was normal. - Right atrium: The atrium was normal in size. - Pulmonary arteries: Systolic pressure was within the normal   range. - Inferior vena cava: The vessel was normal in size. - Pericardium, extracardiac: There was no pericardial effusion.  Impressions: - Since the last study on 07/14/2014 there is new ascending aortic   aneurysm measuring 43 mm. A dedicated chest CTA or MRA is   recommended.  Monitor read 06/01/18 13 days of data recorded on Zio monitor. Patient had a min HR of 44 bpm, max HR of 162 bpm, and avg HR of 70 bpm. Predominant underlying rhythm was Sinus Rhythm. 6 episodes of SVT noted, longest lasted 18.4 seconds at 110 bpm. Fastest lasted only 6 beats at a max rate of 162 bpm. No VT, atrial fibrillation, high degree block, or pauses noted. Isolated atrial and ventricular ectopy was rare (<1%). There were 19 patient triggered events. These were all sinus rhythm, rarely with PAC or PVC.   ECHO: 07/14/2014 - Left ventricle: The cavity size was normal. There was mild concentric hypertrophy. Systolic function was vigorous. The estimated ejection fraction was in the range of 65% to 70%. Wall motion was normal; there were no regional wall motion abnormalities. Doppler parameters are consistent with abnormal left ventricular relaxation (grade 1 diastolic dysfunction). There was no evidence of elevated ventricular filling pressure by Doppler parameters. - Aortic valve: Mildly thickened, mildly calcified leaflets. There was no regurgitation. - Ascending aorta: The ascending aorta was normal in size. - Mitral valve: Structurally normal valve. There was trivial regurgitation. - Left atrium: The atrium was normal in size. - Right ventricle: The cavity size was normal. Wall thickness was normal. Systolic pressure couldn&'t be assessed as  there was no tricuspid regurgitation. - Atrial septum: No defect or patent foramen ovale was identified. - Tricuspid valve: There was no significant regurgitation. - Pulmonic valve: There was no regurgitation. - Inferior vena cava: The vessel was normal in size. The respirophasic diameter changes were in the normal range (= 50%), consistent with normal central venous pressure.  MYOVIEW: 07/21/2014 Impression Exercise Capacity: [place] with no exercise. BP Response: Normal blood pressure response. Clinical Symptoms: Mild shortness of breath ECG Impression: No significant ST segment change suggestive of ischemia. Comparison with Prior Nuclear Study: No images to  compare  Overall Impression: Normal stress nuclear study.  LV Wall Motion: NL LV Function, EF 72%; NL Wall Motion  EKG:  EKG is personally reviewed.  The ekg ordered 11/05/18 demonstrates normal sinus rhythm  Recent Labs: 10/13/2018: Magnesium 2.3 11/02/2018: ALT 13; TSH 0.174 11/05/2018: BUN 14; Creatinine, Ser 1.15; Hemoglobin 14.7; Platelets 313; Potassium 4.0; Sodium 140  Recent Lipid Panel    Component Value Date/Time   CHOL 166 11/02/2018 0827   TRIG 132 11/02/2018 0827   HDL 56 11/02/2018 0827   CHOLHDL 3.0 11/02/2018 0827   CHOLHDL 6.4 (H) 01/01/2016 1012   VLDL 58 (H) 01/01/2016 1012   LDLCALC 84 11/02/2018 0827   LDLDIRECT 169.8 04/15/2007 1017    Physical Exam:    VS:  BP (!) 152/81   Pulse 71   Ht 5' 3.5" (1.613 m)   Wt 213 lb (96.6 kg)   SpO2 96%   BMI 37.14 kg/m     Wt Readings from Last 3 Encounters:  12/15/18 213 lb (96.6 kg)  12/07/18 211 lb (95.7 kg)  11/24/18 216 lb (98 kg)    GEN: Well nourished, well developed in no acute distress HEENT: Normal, moist mucous membranes NECK: No JVD CARDIAC: regular rhythm, normal S1 and S2, no rubs, gallops. 1/6 SM Chest: tender to palpation across several areas of chest VASCULAR: Radial and DP pulses 2+ bilaterally. No carotid  bruits RESPIRATORY:  Clear to auscultation without rales, wheezing or rhonchi  ABDOMEN: Soft, non-tender, non-distended MUSCULOSKELETAL:  Ambulates independently SKIN: Warm and dry, trace bilateral LE edema NEUROLOGIC:  Alert and oriented x 3. No focal neuro deficits noted. PSYCHIATRIC:  Normal affect   ASSESSMENT:    1. Coronary artery disease involving native heart without angina pectoris, unspecified vessel or lesion type   2. Medication management   3. Essential hypertension   4. Mixed hyperlipidemia   5. Family history of heart disease   6. Other chest pain   7. Heart palpitations    PLAN:    Chest discomfort: tender to palpation. Has a history of fibromyalgia and costochondritis  Nonobstructive CAD:  -aspirin: discussed today, willing to try. Has been on in the past. Will monitor for GI upset/melena/hematochezia -statin: has been on in the past, self-discontinued, declined restarting multiple times in the past.  -she would be amenable to vascepa if she qualifies in the future -discussed red flag warning signs that need immediate medical attention  CV risk factors: -hyperlipidemia: LDL goal <70, last LDL 84, TG 132. Recheck at follow up.  -A1c 5.6, no history of diabetes -has family history of CAD, increases her risk -class 2 obesity, with serious side effect of CAD: would benefit from weight loss.   Snoring, extreme daytime fatigue, gasping for air when she sleeps: ordered sleep study previously. She will have done by her pulmonologist.   Hypertension: elevated today, and was also elevated at last visit. Discussed improved control. She is anxious and in pain today -Goal is <130/80.  -just got home BP cuff. Instructed on how to take home BP measurements -has been on diltiazem 360 mg, refilled today. Discussed alternatives such as amlodipine. She tolerates diltiazem, will continue -with recent sepsis 2/2 pyelonephritis, she is understandably nervous re: any agents that can  affect her kidneys. This would exclude thiazide diuretics, ACEI/ARB, and MRA. Can't do amlodipine while on diltiazem. Beta blocker not ideal. -follow up in 3 mos, if kidneys normalized then chlorthalidone may be good option given her occasional ankle edema  Palpitations:  monitor shows normal sinus rhythm with only rare SVT episodes, and these did not occur during her symptomatic periods. All symptomatic events were NSR. -she also uses pulse ox at home; HR 50-60s during her symptoms -on diltiazem 360 mg daily (started by Dr. Debara Pickett in the past)  Plan for follow up: 3 mos to further discuss cardiovascular management including lipid and BP management  TIME SPENT WITH PATIENT: 60 minutes of direct patient care. More than 50% of that time was spent on coordination of care and counseling regarding questions, guideline recommendations, lipids and BP goals.  Buford Dresser, MD, PhD   [facility]   Medication Adjustments/Labs and Tests Ordered: Current medicines are reviewed at length with the patient today.  Concerns regarding medicines are outlined above.  Orders Placed This Encounter  Procedures  . Lipid panel   Meds ordered this encounter  Medications  . aspirin EC 81 MG tablet    Sig: Take 1 tablet (81 mg total) by mouth daily.  Marland Kitchen diltiazem (MATZIM LA) 360 MG 24 hr tablet    Sig: Take 1 tablet (360 mg total) by mouth daily.    Dispense:  30 tablet    Refill:  11    Patient Instructions  Medication Instructions:  Start: Aspirin 81 mg daily  If you need a refill on your cardiac medications before your next appointment, please call your pharmacy.   Lab work: Your physician recommends that you return for lab work in 3 months (lipid)    Testing/Procedures: None  Follow-Up: At [facility], you and your health needs are our priority.  As part of our continuing mission to provide you with exceptional heart care, we have created designated Provider Care Teams.   These Care Teams include your primary Cardiologist (physician) and Advanced Practice Providers (APPs -  Physician Assistants and Nurse Practitioners) who all work together to provide you with the care you need, when you need it. You will need a follow up appointment in 3 months.  Please call our office 2 months in advance to schedule this appointment.  You may see Buford Dresser, MD or one of the following Advanced Practice Providers on your designated Care Team:   Rosaria Ferries, PA-C . Jory Sims, DNP, ANP        Signed, Buford Dresser, MD PhD 12/15/2018 5:59 PM    [place]

## 2018-12-17 ENCOUNTER — Telehealth: Payer: Self-pay | Admitting: *Deleted

## 2018-12-17 ENCOUNTER — Other Ambulatory Visit: Payer: Medicare Other

## 2018-12-17 NOTE — Telephone Encounter (Signed)
Called (12/16/18) to schedule 2 mos follow up with Dr. Salem Caster states she will call back to schedule---called 12/17/18 left message for patient to call

## 2018-12-17 NOTE — Telephone Encounter (Signed)
I spoke with patient and informed her DrAhern checked something called Homocysteine which is abnormal. This may indicate vitamin B deficiency and can be a risk factor for dementia. She recommends you start taking an over- the-counter vitamin B multi supplement. Advised her Vitamin D is technically normal, it is low- normal and Dr Jaynee Eagles would take daily vitamin D as well 1000-2000IU daily. These should be checked again a 4-6 months with primary care or in our office. Patient stated she takes vit D and vit B, but she will check amounts on bottles and supplement. She asked about her CT scan and I advised she call [facility] imaging.  She verbalized understanding, appreciation.

## 2018-12-21 ENCOUNTER — Telehealth: Payer: Self-pay | Admitting: Cardiology

## 2018-12-21 NOTE — Telephone Encounter (Signed)
LVM for patient to call back and schedule 2 month followup with Dr. Harrell Gave.

## 2018-12-28 ENCOUNTER — Telehealth: Payer: Self-pay

## 2018-12-28 NOTE — Telephone Encounter (Signed)

## 2018-12-29 ENCOUNTER — Ambulatory Visit (INDEPENDENT_AMBULATORY_CARE_PROVIDER_SITE_OTHER): Payer: Medicare Other | Admitting: Internal Medicine

## 2018-12-29 ENCOUNTER — Other Ambulatory Visit: Payer: Self-pay

## 2018-12-29 VITALS — BP 124/81 | HR 69 | Temp 97.3°F | Resp 17 | Ht 65.0 in | Wt 212.0 lb

## 2018-12-29 DIAGNOSIS — M150 Primary generalized (osteo)arthritis: Secondary | ICD-10-CM

## 2018-12-29 DIAGNOSIS — I10 Essential (primary) hypertension: Secondary | ICD-10-CM | POA: Diagnosis not present

## 2018-12-29 DIAGNOSIS — N83202 Unspecified ovarian cyst, left side: Secondary | ICD-10-CM | POA: Diagnosis not present

## 2018-12-29 DIAGNOSIS — Z1211 Encounter for screening for malignant neoplasm of colon: Secondary | ICD-10-CM

## 2018-12-29 DIAGNOSIS — E669 Obesity, unspecified: Secondary | ICD-10-CM

## 2018-12-29 DIAGNOSIS — Z2821 Immunization not carried out because of patient refusal: Secondary | ICD-10-CM

## 2018-12-29 DIAGNOSIS — N289 Disorder of kidney and ureter, unspecified: Secondary | ICD-10-CM | POA: Insufficient documentation

## 2018-12-29 DIAGNOSIS — K224 Dyskinesia of esophagus: Secondary | ICD-10-CM | POA: Insufficient documentation

## 2018-12-29 DIAGNOSIS — H9193 Unspecified hearing loss, bilateral: Secondary | ICD-10-CM

## 2018-12-29 DIAGNOSIS — M159 Polyosteoarthritis, unspecified: Secondary | ICD-10-CM | POA: Insufficient documentation

## 2018-12-29 DIAGNOSIS — Z6835 Body mass index (BMI) 35.0-35.9, adult: Secondary | ICD-10-CM

## 2018-12-29 DIAGNOSIS — E039 Hypothyroidism, unspecified: Secondary | ICD-10-CM | POA: Diagnosis not present

## 2018-12-29 DIAGNOSIS — M8949 Other hypertrophic osteoarthropathy, multiple sites: Secondary | ICD-10-CM | POA: Insufficient documentation

## 2018-12-29 NOTE — Progress Notes (Addendum)
Patient ID: Kayla [name], female    DOB: 1946/03/17  MRN: [id number]  CC: Establish Care, Hypertension, Hyperlipidemia, and Hypothyroidism   Subjective: Kayla [name] is a 73 y.o. female who presents for new patient visit.  Patient seen at [facility] Her concerns today include:  Patient with history of HTN,  HL,  Mild CAD, hypothyroidism, fibromyalgia, obesity, dep/anx, IBS, ascending aortic aneurysm, hearing impaired  Previous PCP was NP Mina Marble with [facility].  She decided to change because she loss faith in her care.    Hypothyroid: Patient reports that earlier in the year the NP that she was seeing made a mistake in the dosing of levothyroxine causing her to have palpitations.  She reports that the area was discovered by the pharmacist.  Currently on Levothyroxine 125 mcg.  Referred to endocrine Dr. Forde Dandy. Had a telemed visit with him in July.  Had blood tests done 2.5 wks ago.  Pt states she was told her thyroid level was good and to continue current dose of Levothyroxine 125 mcg.    Hearing impaired: told she has a lot of scarring in the ears and that nothing can be done.  However she has f/u appt with ENT in 03/2019  HTN/mild CAD/history of ascending aortic aneurysm: Followed by cardiologist Dr. Al Pimple with [facility].  -Patient has refused statins.  Supposed to be on aspirin.  - Just got BP cuff and plans to start checking BP.  Compliant with Diltiazem. -Last CT angios of the chest done 05/2018 showed that the aortic aneurysm is stable with diameter of 4.2 cm when compared to imaging study done back in 2009.  Hx of fibromyalgia and OA (spine, LT hip, LT knee and ankle).  Followed by rheumatologist Dr. Kathlene November.  She was on Advil but told by ENT to stop because it was irritating her throat.  Saw GI, Dr. Darrel Hoover,  several wks ago because of feeling of food being caught in throat.  Had barium swallow. This revealed mod esophageal dysmotility with pattern most suggestive  of chronic reflux related dysmotility; small hiatal hernia.  Told she does not have GERD; told to chew her food longer and take smaller bites. Omeprazole d/c.  Told to take TUMS.  Patient states that she Omeprazole on the med list because she intends to use it intermittently if she feels she needs it  LT ovarian cyst: Noted on CT scan done back in July of this year during hospitalization for pyelonephritis.  Radiologist recommended follow-up on this with pelvic ultrasound.  Patient is willing to have that done.  Recently saw neurologist Dr. Jaynee Eagles out of concern about her memory.  Mother had Alzheimer's.  Per her evaluation she thinks the patient's memory is fine but did recommend CT imaging.  Patient tells me that she does word puzzles and other mental activities to try to keep her brain sharp.  She does not exercise much because of arthritis.  She does not take any medications for arthritis pain.  She has been seeing pulmonologist Dr. Lamonte Sakai for evaluation of dyspnea and snoring.  Sleep study ordered.  PFTs revealed restrictive physiology with mild obstruction.  He feels that obesity is the most likely contributing factor.  Weight loss encouraged.  HM:  Declines Prevnar 13, and flu shot.  Last c-scope > 10 yrs ago. She plans to speak with Dr. Darrel Hoover about c-scope   Past medical history lists, past surgical, family and social histories reviewed.  Patient Active Problem List  Diagnosis Date Noted  . Dysuria 11/02/2018  . Pyelonephritis 10/12/2018  . Acute pyelonephritis 10/12/2018  . Chronic maxillary sinusitis 07/07/2018  . Encounter for Medicare annual wellness exam 06/04/2018  . Snoring 06/01/2018  . Costochondritis 06/01/2018  . Family history of heart disease 06/01/2018  . Class 2 severe obesity due to excess calories with serious comorbidity and body mass index (BMI) of 38.0 to 38.9 in adult ([place]) 06/01/2018  . Multiple atypical skin moles 04/28/2017  . Shakiness 11/14/2016  .  Depression, recurrent ([place]) 07/16/2016  . Adjustment disorder with mixed anxiety and depressed mood 01/15/2016  . Fe Def Anemia 12/22/2015  . Scoliosis (and kyphoscoliosis), idiopathic 12/20/2015  . Osteopenia 12/20/2015  . Fibromyalgia 08/02/2014  . GAD (generalized anxiety disorder) 09/17/2012  . Vitamin D deficiency 01/24/2012  . Insomnia 01/24/2012  . Fatigue 10/14/2011  . B12 deficiency   . Hyperlipidemia 11/30/2007  . Coronary artery disease involving native heart without angina pectoris 11/30/2007  . PALPITATIONS, OCCASIONAL 04/15/2007  . Thyroid disease 01/27/2007  . Essential hypertension 01/27/2007  . Reactive airway disease that is not asthma 01/27/2007  . GERD 01/27/2007  . IBS 01/27/2007  . BACK PAIN, CHRONIC 01/27/2007     Current Outpatient Medications on File Prior to Visit  Medication Sig Dispense Refill  . aspirin EC 81 MG tablet Take 1 tablet (81 mg total) by mouth daily.    . Calcium Carbonate-Vitamin D (CALCIUM-D PO) Take by mouth daily. Calcium 600 and D3 5 mcg    . Calcium-Magnesium-Zinc (CAL-MAG-ZINC PO) Take by mouth 2 (two) times daily.    . [employer] VITAMIN B12 1000 MCG tablet TAKE 1 TABLET (1,000 MCG TOTAL) BY MOUTH DAILY. 90 tablet 1  . diltiazem (MATZIM LA) 360 MG 24 hr tablet Take 1 tablet (360 mg total) by mouth daily. 30 tablet 11  . levothyroxine (SYNTHROID) 125 MCG tablet Take 1 tablet (125 mcg total) by mouth daily before breakfast. 45 tablet 0  . omeprazole (PRILOSEC) 40 MG capsule Take 40 mg by mouth daily.    Marland Kitchen pyridOXINE (VITAMIN B-6) 100 MG tablet Take 100 mg by mouth daily.     No current facility-administered medications on file prior to visit.     Allergies  Allergen Reactions  . Ace Inhibitors Anaphylaxis    REACTION: glossal edema  . Lisinopril Anaphylaxis    Social History   Socioeconomic History  . Marital status: Widowed    Spouse name: Not on file  . Number of children: 2  . Years of education: Not on file  . Highest  education level: Bachelor's degree (e.g., BA, AB, BS)  Occupational History  . Not on file  Social Needs  . Financial resource strain: Not on file  . Food insecurity    Worry: Not on file    Inability: Not on file  . Transportation needs    Medical: Not on file    Non-medical: Not on file  Tobacco Use  . Smoking status: Never Smoker  . Smokeless tobacco: Never Used  Substance and Sexual Activity  . Alcohol use: No  . Drug use: No  . Sexual activity: Not on file  Lifestyle  . Physical activity    Days per week: Not on file    Minutes per session: Not on file  . Stress: Not on file  Relationships  . Social Herbalist on phone: Not on file    Gets together: Not on file    Attends religious service: Not  on file    Active member of club or organization: Not on file    Attends meetings of clubs or organizations: Not on file    Relationship status: Not on file  . Intimate partner violence    Fear of current or ex partner: Not on file    Emotionally abused: Not on file    Physically abused: Not on file    Forced sexual activity: Not on file  Other Topics Concern  . Not on file  Social History Narrative   Lost husband 2009, lost mother 2016      Lives at home (son & 3 grandchildren live with her)   Caffeine: 10-12 oz daily    Family History  Problem Relation Age of Onset  . Heart attack Father 32  . Lung cancer Other        uncle, non smoker  . Leukemia Other        uncle  . Cancer Mother        oral  . Stroke Mother   . Alzheimer's disease Mother        at 29  . Cancer Maternal Aunt        breast  . Alcohol abuse Maternal Aunt   . Alcohol abuse Maternal Uncle   . Cancer Maternal Uncle        lung, stomach, oral  . Cancer Paternal Uncle        lung  . Congestive Heart Failure Maternal Grandfather   . Heart disease Maternal Grandfather   . Heart attack Paternal Grandfather   . Cancer Paternal Uncle        bone  . Brain cancer Maternal Grandmother    . Alzheimer's disease Other        maternal great grandmother   . Colon cancer Neg Hx   . Breast cancer Neg Hx   . Diabetes Neg Hx     Past Surgical History:  Procedure Laterality Date  . CESAREAN SECTION  06/10/1975  . CHOLECYSTECTOMY    . NECK SURGERY  1998  . repair broken C6 & C7      ROS: Review of Systems Negative except as stated above  PHYSICAL EXAM: BP 124/81   Pulse 69   Temp (!) 97.3 F (36.3 C) (Temporal)   Resp 17   Ht 5\' 5"  (1.651 m)   Wt 212 lb (96.2 kg)   SpO2 96%   BMI 35.28 kg/m   Physical Exam  General appearance - alert, well appearing, pleasant elderly female and in no distress Mental status - normal mood, behavior, speech, dress, motor activity, and thought processes.  Patient very talkative and sometimes goes off on tangents Eyes - pupils equal and reactive, extraocular eye movements intact Nose - normal and patent, no erythema, discharge or polyps Mouth - mucous membranes moist, pharynx normal without lesions Neck - supple, no significant adenopathy Chest - clear to auscultation, no wheezes, rales or rhonchi, symmetric air entry Heart - normal rate, regular rhythm, normal S1, S2, no murmurs, rubs, clicks or gallops Extremities -no lower extremity edema.   Skin -dried papular skin on both lower legs from the knees down MSK: Patient ambulates independently.  Gait is stable.  Knees: No significant joint enlargement or soft tissue edema.  She has good passive range of motion.   CMP Latest Ref Rng & Units 11/05/2018 11/02/2018 10/13/2018  Glucose 70 - 99 mg/dL 105(H) 109(H) 127(H)  BUN 8 - 23 mg/dL 14 14 12   Creatinine  0.44 - 1.00 mg/dL 1.15(H) 1.23(H) 0.80  Sodium 135 - 145 mmol/L 140 141 141  Potassium 3.5 - 5.1 mmol/L 4.0 4.4 4.0  Chloride 98 - 111 mmol/L 110 104 111  CO2 22 - 32 mmol/L 20(L) 19(L) 24  Calcium 8.9 - 10.3 mg/dL 9.5 10.0 9.0  Total Protein 6.0 - 8.5 g/dL - 7.1 -  Total Bilirubin 0.0 - 1.2 mg/dL - 0.3 -  Alkaline Phos 39 - 117  IU/L - 94 -  AST 0 - 40 IU/L - 16 -  ALT 0 - 32 IU/L - 13 -   Lipid Panel     Component Value Date/Time   CHOL 166 11/02/2018 0827   TRIG 132 11/02/2018 0827   HDL 56 11/02/2018 0827   CHOLHDL 3.0 11/02/2018 0827   CHOLHDL 6.4 (H) 01/01/2016 1012   VLDL 58 (H) 01/01/2016 1012   LDLCALC 84 11/02/2018 0827   LDLDIRECT 169.8 04/15/2007 1017    CBC    Component Value Date/Time   WBC 12.2 (H) 11/05/2018 0905   RBC 4.65 11/05/2018 0905   HGB 14.7 11/05/2018 0905   HGB 14.6 11/02/2018 0827   HCT 46.3 (H) 11/05/2018 0905   HCT 44.0 11/02/2018 0827   PLT 313 11/05/2018 0905   PLT 372 11/02/2018 0827   MCV 99.6 11/05/2018 0905   MCV 94 11/02/2018 0827   MCH 31.6 11/05/2018 0905   MCHC 31.7 11/05/2018 0905   RDW 13.4 11/05/2018 0905   RDW 13.0 11/02/2018 0827   LYMPHSABS 3.7 (H) 11/02/2018 0827   MONOABS 1.0 10/14/2018 0510   EOSABS 0.2 11/02/2018 0827   BASOSABS 0.1 11/02/2018 0827   Depression screen PHQ 2/9 06/04/2018 12/02/2017 10/29/2017  Decreased Interest 0 0 0  Down, Depressed, Hopeless 1 0 0  PHQ - 2 Score 1 0 0  Altered sleeping 1 2 1   Tired, decreased energy 3 3 1   Change in appetite 0 0 1  Feeling bad or failure about yourself  0 0 0  Trouble concentrating 0 0 0  Moving slowly or fidgety/restless 3 0 1  Suicidal thoughts 0 0 0  PHQ-9 Score 8 5 4   Difficult doing work/chores Somewhat difficult Somewhat difficult Somewhat difficult  Some recent data might be hidden    ASSESSMENT AND PLAN: 1. Essential hypertension Close to goal.  Continue diltiazem and low-salt diet  2. Cyst of left ovary - US Pelvis Complete; Future  3. Acquired hypothyroidism Followed by endocrinologist Dr. Forde Dandy.  She will continue her current dose of levothyroxine.  We did not check TSH level today as patient tells me it was checked about 2 weeks ago by the endocrinologist.  4. Esophageal dysmotility Followed by GI Dr. Penelope Coop  5. Hearing impaired person, bilateral Keep follow-up  appointment with ENT in December.  Should consider getting hearing aids  6. Influenza vaccination declined   7. Primary osteoarthritis involving multiple joints Discussed and encourage some exercise as tolerated.  Patient states that she sometimes goes to the grocery store and walks up and down the aisles leaning on a cart.  Encouraged her to do that about twice a week  8. BMI 35.0-35.9,adult Encourage weight loss through increased physical activity and healthy eating habits.  9. Renal insufficiency -Last 2 GFR in range for CKD 3.  Will repeat BMP.  Avoid NSAIDs - Basic Metabolic Panel  10. [id number] pneumococcal polysaccharide vaccine declined   11. Screening for colon cancer We discussed Cologuard versus colonoscopy.  She plans to discuss further  on her follow-up with Dr. Penelope Coop   I spent 40 minutes with this patient in direct face-to-face evaluation discussing diagnosis, management and coordinating care. Patient was given the opportunity to ask questions.  Patient verbalized understanding of the plan and was able to repeat key elements of the plan.   Orders Placed This Encounter  Procedures  . US Pelvis Complete  . Basic Metabolic Panel     Requested Prescriptions    No prescriptions requested or ordered in this encounter    Return in about 6 weeks (around 02/09/2019).  Karle Plumber, MD, FACP

## 2018-12-29 NOTE — Patient Instructions (Signed)
Check your blood pressure twice a week.  Goal is 130/80 or lower.  Limit salt in the foods.  I have referred you for a pelvic ultra sound.    Try to stay active by walking 2-3 times a week for 15-20 minutes as discussed today.

## 2018-12-29 NOTE — Progress Notes (Signed)
Wants to establish care.  Saw GI last week. Hiatal hernia was found. Also was told that her esophagus muscles are weak & that's why her food is getting stuck

## 2018-12-30 LAB — BASIC METABOLIC PANEL
BUN/Creatinine Ratio: 13 (ref 12–28)
BUN: 13 mg/dL (ref 8–27)
CO2: 23 mmol/L (ref 20–29)
Calcium: 9.4 mg/dL (ref 8.7–10.3)
Chloride: 107 mmol/L — ABNORMAL HIGH (ref 96–106)
Creatinine, Ser: 1 mg/dL (ref 0.57–1.00)
GFR calc Af Amer: 65 mL/min/{1.73_m2} (ref >59)
GFR calc non Af Amer: 56 mL/min/{1.73_m2} — ABNORMAL LOW (ref >59)
Glucose: 103 mg/dL — ABNORMAL HIGH (ref 65–99)
Potassium: 4.3 mmol/L (ref 3.5–5.2)
Sodium: 145 mmol/L — ABNORMAL HIGH (ref 134–144)

## 2019-01-13 ENCOUNTER — Ambulatory Visit
Admission: RE | Admit: 2019-01-13 | Discharge: 2019-01-13 | Disposition: A | Discharge status: Home / Self Care | Payer: Medicare Other | Source: Ambulatory Visit | Attending: Internal Medicine | Admitting: Internal Medicine

## 2019-01-13 DIAGNOSIS — N83202 Unspecified ovarian cyst, left side: Secondary | ICD-10-CM

## 2019-01-18 ENCOUNTER — Telehealth: Payer: Self-pay | Admitting: Internal Medicine

## 2019-01-18 ENCOUNTER — Other Ambulatory Visit: Payer: Self-pay | Admitting: Internal Medicine

## 2019-01-18 DIAGNOSIS — N83202 Unspecified ovarian cyst, left side: Secondary | ICD-10-CM

## 2019-01-18 DIAGNOSIS — R9389 Abnormal findings on diagnostic imaging of other specified body structures: Secondary | ICD-10-CM

## 2019-01-18 NOTE — Telephone Encounter (Signed)
Phone call placed to patient this morning to discuss results of pelvic ultrasound.  I got a recording stating that this person cannot be reached at this time and that the mailbox is full so I was unable to leave a message.  I sent patient her results via my chart.  I will also have the CMA try to reach her again later.

## 2019-01-20 NOTE — Telephone Encounter (Signed)
Viewed by [name] on 01/18/2019 5:57 PM

## 2019-02-02 ENCOUNTER — Ambulatory Visit: Payer: Medicare Other

## 2019-02-03 ENCOUNTER — Encounter: Payer: Medicare Other | Admitting: Obstetrics and Gynecology

## 2019-02-09 ENCOUNTER — Ambulatory Visit: Payer: Medicare Other

## 2019-03-02 ENCOUNTER — Ambulatory Visit (INDEPENDENT_AMBULATORY_CARE_PROVIDER_SITE_OTHER): Payer: Medicare Other | Admitting: Internal Medicine

## 2019-03-02 DIAGNOSIS — I10 Essential (primary) hypertension: Secondary | ICD-10-CM | POA: Diagnosis not present

## 2019-03-02 DIAGNOSIS — G4700 Insomnia, unspecified: Secondary | ICD-10-CM

## 2019-03-02 DIAGNOSIS — N83202 Unspecified ovarian cyst, left side: Secondary | ICD-10-CM | POA: Diagnosis not present

## 2019-03-02 DIAGNOSIS — K219 Gastro-esophageal reflux disease without esophagitis: Secondary | ICD-10-CM

## 2019-03-02 MED ORDER — CYANOCOBALAMIN 1000 MCG PO TABS: 1000.0000 ug | ORAL_TABLET | Freq: Every day | ORAL | 1 refills | Status: DC

## 2019-03-02 NOTE — Progress Notes (Signed)
Virtual Visit via Telephone Note Due to current restrictions/limitations of in-office visits due to the COVID-19 pandemic, this scheduled clinical appointment was converted to a telehealth visit I connected with Kayla [name] on 03/02/19 at  4:10 PM EST by telephone and verified that I am speaking with the correct person using two identifiers.   I am in my office.  The patient is at home.  Only the patient and myself participated in this encounter.  I discussed the limitations, risks, security and privacy concerns of performing an evaluation and management service by telephone and the availability of in person appointments. I also discussed with the patient that there may be a patient responsible charge related to this service. The patient expressed understanding and agreed to proceed.   History of Present Illness: Patient with history of HTN,  HL (declines statin),  Mild CAD, hypothyroidism, fibromyalgia, OA (spine, LT hip, knee and ankle) obesity, dep/anx, IBS, ascending aortic aneurysm, hearing impaired, esophageal dysmotility, CKD 3  HTN: Since last visit with me she obtained a blood pressure monitoring device just recently and checked her blood pressure once.  However she does not recall her reading.  She has an appointment with her cardiologist Dr. Harrell Gave tomorrow and plans to discuss with her.  Left ovarian cyst: She did have the pelvic ultrasound done.  Revealed thickened endometrium abnormal for age and 4.5 cm left ovarian cyst.  Patient was referred to gynecology.  She had the appointment but canceled it the day before because she was having car problems.  She told him that she would call back to schedule but forgot to do so.  She plans to call them back.  She stopped taking omeprazole because she read that it can affect the kidneys.  On last visit with me, patient told me that she does not take it every day she takes it when she needs to.  I went over lab results with her.  Her GFR had  improved from 47 now to 56.  She complains of lack of energy despite taking vitamins.  She attributes this to not getting deep restorative sleep.  Her 3 grandchildren 1 of home has ADHD and her son who is their father and is disabled all live with her.  The 3 children are doing remote learning due to the Covid pandemic and it has just been a lot for her.  Last grandchild gets in bed around 11 PM.  She tells me that she has always been a night owl who gets in bed between 12:30-2 a.m. when she gets in bed she turns off all lights and sounds.  It usually takes her about an hour to fall asleep and then she gets up about every 2 hours.  Her son is on a muscle relaxant.  She sometimes takes 1 of those to help relax herself so that she can fall asleep.  Most of the time she wakes up feeling tired.  She also feels that arthritis pain contributes to poor sleep.  However she is not wanting pain medications, she would prefer to take natural supplements that may be anti-inflammatory. Outpatient Encounter Medications as of 03/02/2019  Medication Sig  . aspirin EC 81 MG tablet Take 1 tablet (81 mg total) by mouth daily.  . Calcium Carbonate-Vitamin D (CALCIUM-D PO) Take by mouth daily. Calcium 600 and D3 5 mcg  . Calcium-Magnesium-Zinc (CAL-MAG-ZINC PO) Take by mouth 2 (two) times daily.  . CVS VITAMIN B12 1000 MCG tablet TAKE 1 TABLET (1,000 MCG  TOTAL) BY MOUTH DAILY.  Marland Kitchen diltiazem (MATZIM LA) 360 MG 24 hr tablet Take 1 tablet (360 mg total) by mouth daily.  Marland Kitchen levothyroxine (SYNTHROID) 125 MCG tablet Take 1 tablet (125 mcg total) by mouth daily before breakfast.  . pyridOXINE (VITAMIN B-6) 100 MG tablet Take 100 mg by mouth daily.  . [DISCONTINUED] omeprazole (PRILOSEC) 40 MG capsule Take 40 mg by mouth daily.   No facility-administered encounter medications on file as of 03/02/2019.     Observations/Objective: No direct observation done as this is a telephone encounter  Assessment and Plan: 1. Essential  hypertension She will continue diltiazem Advised to write down blood pressure readings when she checks.  2. Cyst of left ovary I will resubmit the referral to the gynecologist.  Encourage patient to follow through with the appointment.  3. Gastroesophageal reflux disease without esophagitis Advised patient that as long as she is not having to take the omeprazole every day which should be okay in regards to the kidney function.  4. Insomnia, unspecified type Good sleep hygiene discussed.  Encourage patient to get in bed around about the same time every night, turn off all lights and sounds, avoid drinking caffeinated beverages or alcohol use around bedtime.  Advised against taking her son's muscle relaxant as it may cause excessive drowsiness in elderly pts.  I recommend trial of low-dose trazodone.  Patient states that her daughter is on this medication and she would like to speak with her first to see how she is doing on it before deciding to take it.   Follow Up Instructions: Follow-up in 3 months   I discussed the assessment and treatment plan with the patient. The patient was provided an opportunity to ask questions and all were answered. The patient agreed with the plan and demonstrated an understanding of the instructions.   The patient was advised to call back or seek an in-person evaluation if the symptoms worsen or if the condition fails to improve as anticipated.  I provided 23 minutes of non-face-to-face time during this encounter.   Karle Plumber, MD

## 2019-03-03 ENCOUNTER — Other Ambulatory Visit: Payer: Self-pay

## 2019-03-03 ENCOUNTER — Encounter: Payer: Self-pay | Admitting: Cardiology

## 2019-03-03 ENCOUNTER — Ambulatory Visit: Payer: Medicare Other | Admitting: Cardiology

## 2019-03-03 VITALS — BP 150/94 | HR 74 | Ht 64.0 in | Wt 215.8 lb

## 2019-03-03 DIAGNOSIS — I10 Essential (primary) hypertension: Secondary | ICD-10-CM | POA: Diagnosis not present

## 2019-03-03 DIAGNOSIS — I2510 Atherosclerotic heart disease of native coronary artery without angina pectoris: Secondary | ICD-10-CM

## 2019-03-03 DIAGNOSIS — E782 Mixed hyperlipidemia: Secondary | ICD-10-CM

## 2019-03-03 MED ORDER — AMLODIPINE BESYLATE 10 MG PO TABS: 10.0000 mg | ORAL_TABLET | Freq: Every day | ORAL | 11 refills | Status: DC

## 2019-03-03 NOTE — Patient Instructions (Signed)
Medication Instructions:  Stop: Diltiazem 360 mg Start: Amlodipine 10 mg daily  *If you need a refill on your cardiac medications before your next appointment, please call your pharmacy*  Lab Work: None  Testing/Procedures: None  Follow-Up: At [facility], you and your health needs are our priority.  As part of our continuing mission to provide you with exceptional heart care, we have created designated Provider Care Teams.  These Care Teams include your primary Cardiologist (physician) and Advanced Practice Providers (APPs -  Physician Assistants and Nurse Practitioners) who all work together to provide you with the care you need, when you need it.  Your next appointment:   3-4 week(s)  The format for your next appointment:   In Person  Provider:   Buford Dresser, MD

## 2019-03-03 NOTE — Progress Notes (Signed)
Cardiology Office Note:    Date:  03/03/2019   ID:  Kayla [name], DOB [birth year], MRN [id number]  PCP:  Ladell Pier, MD  Cardiologist:  Buford Dresser, MD PhD  Referring MD: Ladell Pier, MD   CC: follow up  History of Present Illness:    Kayla [name] is a 73 y.o. female with a hx of mild CAD, HTN, HLD, IBS, hypothyroid, GERD, fibromyalgia, OA who is seen for follow up today. I first met her on 06/01/18. Seen initially by Rosaria Ferries 03/26/18 for hypertension and chest discomfort.  Today: Feeling better since thyroid medication has been stable, but still has extreme fatigue. Inflammatory arthritis has progressed as well, affecting her sleep. She also has her son and grandchildren at home, doing home school. This has been a challenge.   Overall doesn't feel herself for the last two years. Used to be active, now feels like she is dragging.   Hypertension: Got a BP cuff but lost it last week. Was concerned that the readings were high. Had been between 426-834H systolic. Pulse has been 55-60 at home. O2 level has been 93% or greater.   Chest pain: has hiatal hernia, esophageal issues. Hasn't had any pain not related to food or not better with pepto bismol.  Denies shortness of breath at rest or with normal exertion. No PND, orthopnea, LE edema or unexpected weight gain. No syncope or palpitations.  Past Medical History:  Diagnosis Date  . Anemia   . Anxiety   . Asthma   . B12 deficiency   . Bursitis   . Chronic LBP   . Chronic UTI   . Diastolic dysfunction   . Fibromyalgia   . GERD (gastroesophageal reflux disease)   . Hyperlipemia   . Hypertension   . Hypothyroid   . IBS (irritable bowel syndrome)   . Inflammatory osteoarthritis   . Kidney infection   . Psoriatic arthritis ([place])   . S/P cardiac cath 11/30/2007   normal coronaries - Dr. Burt Knack (Dr. Debara Pickett reviewed films on 01/10/2016)    Past Surgical History:  Procedure Laterality Date  . CESAREAN  SECTION  06/10/1975  . CHOLECYSTECTOMY    . NECK SURGERY  1998  . repair broken C6 & C7      Current Medications: Current Outpatient Medications on File Prior to Visit  Medication Sig  . aspirin EC 81 MG tablet Take 1 tablet (81 mg total) by mouth daily.  . Calcium Carbonate-Vitamin D (CALCIUM-D PO) Take by mouth daily. Calcium 600 and D3 5 mcg  . Calcium-Magnesium-Zinc (CAL-MAG-ZINC PO) Take by mouth 2 (two) times daily.  . cyanocobalamin ([facility] VITAMIN B12) 1000 MCG tablet Take 1 tablet (1,000 mcg total) by mouth daily.  Marland Kitchen levothyroxine (SYNTHROID) 125 MCG tablet Take 1 tablet (125 mcg total) by mouth daily before breakfast.  . pyridOXINE (VITAMIN B-6) 100 MG tablet Take 100 mg by mouth daily.   No current facility-administered medications on file prior to visit.      Allergies:   Ace inhibitors and Lisinopril   Social History   Tobacco Use  . Smoking status: Never Smoker  . Smokeless tobacco: Never Used  Substance Use Topics  . Alcohol use: No  . Drug use: No    Family History: The patient's family history includes Alcohol abuse in her maternal aunt and maternal uncle; Alzheimer's disease in her mother and another family member; Brain cancer in her maternal grandmother; Cancer in her maternal aunt, maternal uncle, mother,  paternal uncle, and paternal uncle; Congestive Heart Failure in her maternal grandfather; Heart attack in her paternal grandfather; Heart attack (age of onset: 23) in her father; Heart disease in her maternal grandfather; Leukemia in an other family member; Lung cancer in an other family member; Stroke in her mother. There is no history of Colon cancer, Breast cancer, or Diabetes.  ROS:   Please see the history of present illness.  Additional pertinent ROS: Constitutional: Negative for chills, fever, night sweats, unintentional weight loss  HENT: Negative for ear pain and hearing loss.   Eyes: Negative for loss of vision and eye pain.  Respiratory: Negative  for cough, sputum, wheezing.   Cardiovascular: See HPI. Gastrointestinal: Negative for abdominal pain, melena, and hematochezia.  Genitourinary: Negative for dysuria and hematuria.  Musculoskeletal: Negative for falls, positive for myalgias.  Skin: Negative for itching and rash.  Neurological: Negative for focal weakness, focal sensory changes and loss of consciousness.  Endo/Heme/Allergies: Does not bruise/bleed easily.   EKGs/Labs/Other Studies Reviewed:    The following studies were reviewed today: CTA 02-Jun-2018 1. 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 Technically, current recommendations call for annual follow-up imaging for thoracic aneurysms of 4 cm or larger. However, 10 year stability of the current aneurysm is certainly reassuring. Aortic aneurysm NOS (ICD10-I71.9), Aortic Atherosclerosis (ICD10-170.0) 2. Small hiatal hernia.   Echo 04/29/18 Left ventricle: The cavity size was normal. There was mild   concentric hypertrophy. Systolic function was vigorous. The   estimated ejection fraction was in the range of 65% to 70%. Wall   motion was normal; there were no regional wall motion   abnormalities. Doppler parameters are consistent with abnormal   left ventricular relaxation (grade 1 diastolic dysfunction). - Aortic valve: Valve area (VTI): 4.83 cm^2. Valve area (Vmax):   4.21 cm^2. Valve area (Vmean): 4.76 cm^2. - Left atrium: The atrium was mildly dilated. - Right ventricle: The cavity size was normal. Wall thickness was   normal. Systolic function was normal. - Right atrium: The atrium was normal in size. - Pulmonary arteries: Systolic pressure was within the normal   range. - Inferior vena cava: The vessel was normal in size. - Pericardium, extracardiac: There was no pericardial effusion.  Impressions: -  Since the last study on 07/14/2014 there is new ascending aortic   aneurysm measuring 43 mm. A dedicated chest CTA or MRA is   recommended.  Monitor read 06/01/18 13 days of data recorded on Zio monitor. Patient had a min HR of 44 bpm, max HR of 162 bpm, and avg HR of 70 bpm. Predominant underlying rhythm was Sinus Rhythm. 6 episodes of SVT noted, longest lasted 18.4 seconds at 110 bpm. Fastest lasted only 6 beats at a max rate of 162 bpm. No VT, atrial fibrillation, high degree block, or pauses noted. Isolated atrial and ventricular ectopy was rare (<1%). There were 19 patient triggered events. These were all sinus rhythm, rarely with PAC or PVC.   ECHO: 07/14/2014 - Left ventricle: The cavity size was normal. There was mild concentric hypertrophy. Systolic function was vigorous. The estimated ejection fraction was in the range of 65% to 70%. Wall motion was normal; there were no regional wall motion abnormalities. Doppler parameters are consistent with abnormal left ventricular relaxation (grade 1 diastolic dysfunction). There was no evidence of elevated ventricular filling pressure by Doppler parameters. - Aortic  valve: Mildly thickened, mildly calcified leaflets. There was no regurgitation. - Ascending aorta: The ascending aorta was normal in size. - Mitral valve: Structurally normal valve. There was trivial regurgitation. - Left atrium: The atrium was normal in size. - Right ventricle: The cavity size was normal. Wall thickness was normal. Systolic pressure couldn&'t be assessed as there was no tricuspid regurgitation. - Atrial septum: No defect or patent foramen ovale was identified. - Tricuspid valve: There was no significant regurgitation. - Pulmonic valve: There was no regurgitation. - Inferior vena cava: The vessel was normal in size. The respirophasic diameter changes were in the normal range (= 50%), consistent with normal central venous  pressure.  MYOVIEW: 07/21/2014 Impression Exercise Capacity: [place] with no exercise. BP Response: Normal blood pressure response. Clinical Symptoms: Mild shortness of breath ECG Impression: No significant ST segment change suggestive of ischemia. Comparison with Prior Nuclear Study: No images to compare  Overall Impression: Normal stress nuclear study.  LV Wall Motion: NL LV Function, EF 72%; NL Wall Motion  EKG:  EKG is personally reviewed.  The ekg ordered 11/05/18 demonstrates normal sinus rhythm  Recent Labs: 10/13/2018: Magnesium 2.3 11/02/2018: ALT 13; TSH 0.174 11/05/2018: Hemoglobin 14.7; Platelets 313 12/29/2018: BUN 13; Creatinine, Ser 1.00; Potassium 4.3; Sodium 145  Recent Lipid Panel    Component Value Date/Time   CHOL 166 11/02/2018 0827   TRIG 132 11/02/2018 0827   HDL 56 11/02/2018 0827   CHOLHDL 3.0 11/02/2018 0827   CHOLHDL 6.4 (H) 01/01/2016 1012   VLDL 58 (H) 01/01/2016 1012   LDLCALC 84 11/02/2018 0827   LDLDIRECT 169.8 04/15/2007 1017    Physical Exam:    VS:  BP (!) 150/94   Pulse 74   Ht 5' 4" (1.626 m)   Wt 215 lb 12.8 oz (97.9 kg)   SpO2 99%   BMI 37.04 kg/m     Wt Readings from Last 3 Encounters:  03/03/19 215 lb 12.8 oz (97.9 kg)  12/29/18 212 lb (96.2 kg)  12/15/18 213 lb (96.6 kg)    GEN: Well nourished, well developed in no acute distress HEENT: Normal, moist mucous membranes NECK: No JVD CARDIAC: regular rhythm, normal S1 and S2, no rubs or gallops. No murmur. VASCULAR: Radial and DP pulses 2+ bilaterally. No carotid bruits RESPIRATORY:  Clear to auscultation without rales, wheezing or rhonchi  ABDOMEN: Soft, non-tender, non-distended MUSCULOSKELETAL:  Ambulates independently SKIN: Warm and dry, no edema NEUROLOGIC:  Alert and oriented x 3. No focal neuro deficits noted. PSYCHIATRIC:  Normal affect   ASSESSMENT:    1. Essential hypertension   2. Nonocclusive coronary atherosclerosis of native coronary artery   3.  Mixed hyperlipidemia    PLAN:    Hypertension: elevated today, and was also elevated at last visit. -Goal is <130/80.  -lost home BP cuff, cannot measure -she will not consider any medications that can affect kidneys. Exclude ACEi, ARB, MRA, thiazide as potential medications -we discussed options. She is amenable to trialing amlodipine 10 mg instead of her diltiazem. Did discuss that her HR will likely rise, she may note palpitations again. She understands -would be cautious about beta blockers given fatigue  Nonobstructive CAD:  -aspirin: takes intermittently -statin: has been on in the past, self-discontinued, declined restarting multiple times in the past.  -discussed red flag warning signs that need immediate medical attention  History of TAA: stable on imaging (10 years between)  CV risk factors: -hyperlipidemia: LDL goal <70, last LDL 84, TG 132. Recheck at  follow up.  -A1c 5.6, no history of diabetes -has family history of CAD, increases her risk -class 2 obesity, with serious side effect of CAD: would benefit from weight loss.   Plan for follow up: 3-4 weeks for BP management as she cannot check at home  Medication Adjustments/Labs and Tests Ordered: Current medicines are reviewed at length with the patient today.  Concerns regarding medicines are outlined above.  No orders of the defined types were placed in this encounter.  Meds ordered this encounter  Medications  . amLODipine (NORVASC) 10 MG tablet    Sig: Take 1 tablet (10 mg total) by mouth daily.    Dispense:  30 tablet    Refill:  11    Patient Instructions  Medication Instructions:  Stop: Diltiazem 360 mg Start: Amlodipine 10 mg daily  *If you need a refill on your cardiac medications before your next appointment, please call your pharmacy*  Lab Work: None  Testing/Procedures: None  Follow-Up: At [facility], you and your health needs are our priority.  As part of our continuing mission to  provide you with exceptional heart care, we have created designated Provider Care Teams.  These Care Teams include your primary Cardiologist (physician) and Advanced Practice Providers (APPs -  Physician Assistants and Nurse Practitioners) who all work together to provide you with the care you need, when you need it.  Your next appointment:   3-4 week(s)  The format for your next appointment:   In Person  Provider:   Buford Dresser, MD       Signed, Buford Dresser, MD PhD 03/03/2019 6:25 PM    [place]

## 2019-03-08 ENCOUNTER — Telehealth: Payer: Self-pay | Admitting: Cardiology

## 2019-03-08 NOTE — Telephone Encounter (Signed)
Pt c/o medication issue:  1. Name of Medication: amLODipine (NORVASC) 10 MG tablet  2. How are you currently taking this medication (dosage and times per day)? Not taking   3. Are you having a reaction (difficulty breathing--STAT)?   4. What is your medication issue? PT would like to go back on the medication she was on before starting this medication.  Patient took one dose of this medication yesterday. Within 30 minutes of taking the medication, she had an intense throbbing feeling in her L Arm. Within 1 hour of taking the medicine, her skin felt itchy all over. Her heart was beating harder than normal, almost a pounding sensation. Her O2 saturation was 88, but it is normally in the 94-95 range. Her pulse was high yesterday, around 135. At the time of the call, her pulse was still in the 100s, and it rests in the 50-55 range.  She just wanted to let Dr. Harrell Gave know that she will not be taking this medication anymore. She would like a new rx for old mediation [facility] to [facility] on [address]. She will make an appt in early January to discuss other options.   If the office can not reach her, please let her know via MyChart what she would need to do

## 2019-03-08 NOTE — Telephone Encounter (Signed)
Will route to MD to make aware, okay to call in old medication? Thank you!

## 2019-03-09 ENCOUNTER — Telehealth: Payer: Self-pay | Admitting: Cardiology

## 2019-03-09 MED ORDER — DILTIAZEM HCL ER BEADS 360 MG PO CP24: 360.0000 mg | ORAL_CAPSULE | Freq: Every day | ORAL | 1 refills | Status: DC

## 2019-03-09 NOTE — Telephone Encounter (Signed)
Ok to stop amlodipine, restart diltiazem 360 mg long acting daily. Agree with visit to discuss alternatives.

## 2019-03-09 NOTE — Telephone Encounter (Signed)
Called pharmacy back- verified switching back to old med. No other questions at this time.

## 2019-03-09 NOTE — Telephone Encounter (Signed)
[name] from [facility] is calling to get verbal order to make sure the patient needs to be on diltiazem (TIAZAC) 360 MG 24 hr capsule

## 2019-03-09 NOTE — Telephone Encounter (Signed)
Sent mychart message and medication to pharmacy.

## 2019-04-05 ENCOUNTER — Telehealth: Payer: Self-pay | Admitting: Cardiology

## 2019-04-05 ENCOUNTER — Encounter: Payer: Self-pay | Admitting: Cardiovascular Disease

## 2019-04-05 NOTE — Telephone Encounter (Signed)
Spoke with pt who report since stopping amlodipine and restarting diltiazem 360 mg, she has noticed an increase in her BP.    12/25 176/1010 6 pm 168/101 6:30 pm 165/101 7 pm 182/108 8:30 pm 147/91 11:25 pm  12/26 159/89 1 am 205/107 10 am  12/28 148/83 ( lowest in 3 weeks)  Pt inquiring if medication need to be increased.

## 2019-04-05 NOTE — Telephone Encounter (Signed)
Pt c/o medication issue:  1. Name of Medication: diltiazem (TIAZAC) 360 MG 24 hr capsule 2. How are you currently taking this medication (dosage and times per day)? As directed  3. Are you having a reaction (difficulty breathing--STAT)? BP is still high  4. What is your medication issue? Patient states that her BP is still high. 170/100, stays this way for 6-7 hours. Does come down some throughout the day but is never normal. States she is always SOB and that that is nothing new.

## 2019-04-07 NOTE — Telephone Encounter (Signed)
Patient called today to check on status.

## 2019-04-08 NOTE — Telephone Encounter (Signed)
Diltiazem is at the maximum dose. We cannot increase it. Can we get her either a virtual or in person visit soon and we can talk about options? Thanks.

## 2019-04-12 NOTE — Telephone Encounter (Signed)
Left message to call back  

## 2019-04-12 NOTE — Telephone Encounter (Signed)
Follow up  ° ° °Pt is returning call  ° ° °Please call back  °

## 2019-04-12 NOTE — Telephone Encounter (Signed)
Pt updated and verbalized understanding. Appointment scheduled for 1/6 at 4 pm.

## 2019-04-13 ENCOUNTER — Encounter: Payer: Medicare Other | Admitting: Obstetrics and Gynecology

## 2019-04-14 ENCOUNTER — Encounter: Payer: Self-pay | Admitting: Cardiology

## 2019-04-14 ENCOUNTER — Other Ambulatory Visit: Payer: Self-pay

## 2019-04-14 ENCOUNTER — Ambulatory Visit: Payer: Medicare Other | Admitting: Cardiology

## 2019-04-14 VITALS — BP 147/91 | HR 88 | Temp 97.0°F | Ht 64.0 in | Wt 218.0 lb

## 2019-04-14 DIAGNOSIS — I10 Essential (primary) hypertension: Secondary | ICD-10-CM | POA: Diagnosis not present

## 2019-04-14 DIAGNOSIS — I2510 Atherosclerotic heart disease of native coronary artery without angina pectoris: Secondary | ICD-10-CM

## 2019-04-14 DIAGNOSIS — Z7189 Other specified counseling: Secondary | ICD-10-CM

## 2019-04-14 MED ORDER — CHLORTHALIDONE 25 MG PO TABS: 25.0000 mg | ORAL_TABLET | Freq: Every day | ORAL | 11 refills | Status: DC

## 2019-04-14 NOTE — Patient Instructions (Signed)
Medication Instructions:  Start Chlorthalidone 25mg  daily *If you need a refill on your cardiac medications before your next appointment, please call your pharmacy*  Lab Work: none  Testing/Procedures: none  Follow-Up: At [facility], you and your health needs are our priority.  As part of our continuing mission to provide you with exceptional heart care, we have created designated Provider Care Teams.  These Care Teams include your primary Cardiologist (physician) and Advanced Practice Providers (APPs -  Physician Assistants and Nurse Practitioners) who all work together to provide you with the care you need, when you need it.  Your next appointment:   3-4 week(s)  The format for your next appointment:   In Person  Provider:   Buford Dresser, MD

## 2019-04-14 NOTE — Progress Notes (Signed)
Cardiology Office Note:    Date:  04/14/2019   ID:  Kayla [name], DOB Aug 11, 1945, MRN [id number]  PCP:  Ladell Pier, MD  Cardiologist:  Buford Dresser, MD PhD  Referring MD: Ladell Pier, MD   CC: follow up  History of Present Illness:    Kayla [name] is a 74 y.o. female with a hx of mild CAD, HTN, HLD, IBS, hypothyroid, GERD, fibromyalgia, OA who is seen for follow up today. I first met her on 06/01/18. Seen initially by Rosaria Ferries 03/26/18 for hypertension and chest discomfort.  Today: Here to discuss options re: medications. Reviewed available BP in the chart; forgot to bring her log with her. Today is the first day it's been less than 150 SBP.   She is very stressed, feels that her body is full of inflammation. Has been having signficant headaches due to new glasses/struggles with getting a good fit/alignment with these. Has also been having a lot of joint aches/pains recently. Has ankle swelling/tightness, told by rheumatologist that that this is likely arthritis. Feels that in general her body is causing her a lot of issues.  Denies chest pain, shortness of breath at rest or with normal exertion. No PND, orthopnea, LE edema or unexpected weight gain. No syncope or palpitations.  Past Medical History:  Diagnosis Date  . Anemia   . Anxiety   . Asthma   . B12 deficiency   . Bursitis   . Chronic LBP   . Chronic UTI   . Diastolic dysfunction   . Fibromyalgia   . GERD (gastroesophageal reflux disease)   . Hyperlipemia   . Hypertension   . Hypothyroid   . IBS (irritable bowel syndrome)   . Inflammatory osteoarthritis   . Kidney infection   . Psoriatic arthritis ([place])   . S/P cardiac cath 11/30/2007   normal coronaries - Dr. Burt Knack (Dr. Debara Pickett reviewed films on 01/10/2016)    Past Surgical History:  Procedure Laterality Date  . CESAREAN SECTION  06/10/1975  . CHOLECYSTECTOMY    . NECK SURGERY  1998  . repair broken C6 & C7      Current  Medications: Current Outpatient Medications on File Prior to Visit  Medication Sig  . aspirin EC 81 MG tablet Take 1 tablet (81 mg total) by mouth daily.  . Calcium-Magnesium-Zinc (CAL-MAG-ZINC PO) Take by mouth 2 (two) times daily.  . cyanocobalamin ([employer] VITAMIN B12) 1000 MCG tablet Take 1 tablet (1,000 mcg total) by mouth daily.  Marland Kitchen diltiazem (TIAZAC) 360 MG 24 hr capsule Take 1 capsule (360 mg total) by mouth daily.  . ergocalciferol (VITAMIN D2) 1.25 MG (50000 UT) capsule Vitamin D2 1,250 mcg (50,000 unit) capsule  . ferrous sulfate 325 (65 FE) MG tablet ferrous sulfate 325 mg (65 mg iron) tablet  TAKE 1 TABLET BY MOUTH 2 TIMES DAILY WITH A MEAL  . levothyroxine (SYNTHROID) 125 MCG tablet Take 1 tablet (125 mcg total) by mouth daily before breakfast.  . Omega-3 Fatty Acids (FISH OIL) 1000 MG CAPS Take by mouth.  . pyridOXINE (VITAMIN B-6) 100 MG tablet Take 100 mg by mouth daily.   No current facility-administered medications on file prior to visit.     Allergies:   Ace inhibitors and Lisinopril   Social History   Tobacco Use  . Smoking status: Never Smoker  . Smokeless tobacco: Never Used  Substance Use Topics  . Alcohol use: No  . Drug use: No    Family History: The  patient's family history includes Alcohol abuse in her maternal aunt and maternal uncle; Alzheimer's disease in her mother and another family member; Brain cancer in her maternal grandmother; Cancer in her maternal aunt, maternal uncle, mother, paternal uncle, and paternal uncle; Congestive Heart Failure in her maternal grandfather; Heart attack in her paternal grandfather; Heart attack (age of onset: 85) in her father; Heart disease in her maternal grandfather; Leukemia in an other family member; Lung cancer in an other family member; Stroke in her mother. There is no history of Colon cancer, Breast cancer, or Diabetes.  ROS:   Please see the history of present illness.  Additional pertinent ROS negative except as  noted.   EKGs/Labs/Other Studies Reviewed:    The following studies were reviewed today: CTA 06/16/2018 1. 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 Technically, current recommendations call for annual follow-up imaging for thoracic aneurysms of 4 cm or larger. However, 10 year stability of the current aneurysm is certainly reassuring. Aortic aneurysm NOS (ICD10-I71.9), Aortic Atherosclerosis (ICD10-170.0) 2. Small hiatal hernia.   Echo 04/29/18 Left ventricle: The cavity size was normal. There was mild   concentric hypertrophy. Systolic function was vigorous. The   estimated ejection fraction was in the range of 65% to 70%. Wall   motion was normal; there were no regional wall motion   abnormalities. Doppler parameters are consistent with abnormal   left ventricular relaxation (grade 1 diastolic dysfunction). - Aortic valve: Valve area (VTI): 4.83 cm^2. Valve area (Vmax):   4.21 cm^2. Valve area (Vmean): 4.76 cm^2. - Left atrium: The atrium was mildly dilated. - Right ventricle: The cavity size was normal. Wall thickness was   normal. Systolic function was normal. - Right atrium: The atrium was normal in size. - Pulmonary arteries: Systolic pressure was within the normal   range. - Inferior vena cava: The vessel was normal in size. - Pericardium, extracardiac: There was no pericardial effusion.  Impressions: - Since the last study on 07/14/2014 there is new ascending aortic   aneurysm measuring 43 mm. A dedicated chest CTA or MRA is   recommended.  Monitor read 06/01/18 13 days of data recorded on Zio monitor. Patient had a min HR of 44 bpm, max HR of 162 bpm, and avg HR of 70 bpm. Predominant underlying rhythm was Sinus Rhythm. 6 episodes of SVT noted, longest lasted 18.4 seconds at 110 bpm. Fastest lasted only 6 beats at a  max rate of 162 bpm. No VT, atrial fibrillation, high degree block, or pauses noted. Isolated atrial and ventricular ectopy was rare (<1%). There were 19 patient triggered events. These were all sinus rhythm, rarely with PAC or PVC.   ECHO: 07/14/2014 - Left ventricle: The cavity size was normal. There was mild concentric hypertrophy. Systolic function was vigorous. The estimated ejection fraction was in the range of 65% to 70%. Wall motion was normal; there were no regional wall motion abnormalities. Doppler parameters are consistent with abnormal left ventricular relaxation (grade 1 diastolic dysfunction). There was no evidence of elevated ventricular filling pressure by Doppler parameters. - Aortic valve: Mildly thickened, mildly calcified leaflets. There was no regurgitation. - Ascending aorta: The ascending aorta was normal in size. - Mitral valve: Structurally normal valve. There was trivial regurgitation. - Left atrium: The atrium was normal in size. - Right ventricle: The cavity size was normal. Wall thickness was normal.  Systolic pressure couldn&'t be assessed as there was no tricuspid regurgitation. - Atrial septum: No defect or patent foramen ovale was identified. - Tricuspid valve: There was no significant regurgitation. - Pulmonic valve: There was no regurgitation. - Inferior vena cava: The vessel was normal in size. The respirophasic diameter changes were in the normal range (= 50%), consistent with normal central venous pressure.  MYOVIEW: 07/21/2014 Impression Exercise Capacity: [place] with no exercise. BP Response: Normal blood pressure response. Clinical Symptoms: Mild shortness of breath ECG Impression: No significant ST segment change suggestive of ischemia. Comparison with Prior Nuclear Study: No images to compare  Overall Impression: Normal stress nuclear study.  LV Wall Motion: NL LV Function, EF 72%; NL Wall  Motion  EKG:  EKG is personally reviewed.  The ekg ordered 11/05/18 demonstrates normal sinus rhythm  Recent Labs: 10/13/2018: Magnesium 2.3 11/02/2018: ALT 13; TSH 0.174 11/05/2018: Hemoglobin 14.7; Platelets 313 12/29/2018: BUN 13; Creatinine, Ser 1.00; Potassium 4.3; Sodium 145  Recent Lipid Panel    Component Value Date/Time   CHOL 166 11/02/2018 0827   TRIG 132 11/02/2018 0827   HDL 56 11/02/2018 0827   CHOLHDL 3.0 11/02/2018 0827   CHOLHDL 6.4 (H) 01/01/2016 1012   VLDL 58 (H) 01/01/2016 1012   LDLCALC 84 11/02/2018 0827   LDLDIRECT 169.8 04/15/2007 1017    Physical Exam:    VS:  BP (!) 147/91   Pulse 88   Temp (!) 97 F (36.1 C)   Ht 5' 4"  (1.626 m)   Wt 218 lb (98.9 kg)   SpO2 98%   BMI 37.42 kg/m     Wt Readings from Last 3 Encounters:  04/14/19 218 lb (98.9 kg)  03/03/19 215 lb 12.8 oz (97.9 kg)  12/29/18 212 lb (96.2 kg)    GEN: Well nourished, well developed in no acute distress HEENT: Normal, moist mucous membranes NECK: No JVD CARDIAC: regular rhythm, normal S1 and S2, no rubs or gallops. No murmur. VASCULAR: Radial and DP pulses 2+ bilaterally. No carotid bruits RESPIRATORY:  Clear to auscultation without rales, wheezing or rhonchi  ABDOMEN: Soft, non-tender, non-distended MUSCULOSKELETAL:  Ambulates independently SKIN: Warm and dry, no edema NEUROLOGIC:  Alert and oriented x 3. No focal neuro deficits noted. PSYCHIATRIC:  Normal affect   ASSESSMENT:    1. Essential hypertension   2. Nonocclusive coronary atherosclerosis of native coronary artery   3. Cardiac risk counseling   4. Counseling on health promotion and disease prevention    PLAN:    Hypertension: remains elevated. We discussed options at length today.  -Goal is <130/80.  -she is very concerned re: history with prior kidneys. Will not take ACEi, ARB, MRA. She is willing to trial chlorthalidone with close follow up -tried amlodipine instead of diltiazem, but had side effects (itching,  throbbing in arm) so changed back to diltiazem -would be cautious about beta blockers given fatigue  Nonobstructive CAD:  -aspirin: takes intermittently -statin: has been on in the past, self-discontinued, declined restarting multiple times in the past.  -discussed red flag warning signs that need immediate medical attention  History of TAA: stable on imaging (10 years between)  CV risk factors: -hyperlipidemia: LDL goal <70, last LDL 84, TG 132. Recheck at follow up.  -A1c 5.6, no history of diabetes -has family history of CAD, increases her risk -class 2 obesity, with serious side effect of CAD: would benefit from weight loss.   Plan for follow up: 3-4 weeks for BP management   Medication  Adjustments/Labs and Tests Ordered: Current medicines are reviewed at length with the patient today.  Concerns regarding medicines are outlined above.  No orders of the defined types were placed in this encounter.  Meds ordered this encounter  Medications  . chlorthalidone (HYGROTON) 25 MG tablet    Sig: Take 1 tablet (25 mg total) by mouth daily.    Dispense:  30 tablet    Refill:  11    Patient Instructions  Medication Instructions:  Start Chlorthalidone 27m daily *If you need a refill on your cardiac medications before your next appointment, please call your pharmacy*  Lab Work: none  Testing/Procedures: none  Follow-Up: At [facility] you and your health needs are our priority.  As part of our continuing mission to provide you with exceptional heart care, we have created designated Provider Care Teams.  These Care Teams include your primary Cardiologist (physician) and Advanced Practice Providers (APPs -  Physician Assistants and Nurse Practitioners) who all work together to provide you with the care you need, when you need it.  Your next appointment:   3-4 week(s)  The format for your next appointment:   In Person  Provider:   BBuford Dresser MD        Signed, BBuford Dresser MD PhD 04/14/2019   [place]

## 2019-04-15 ENCOUNTER — Telehealth: Payer: Self-pay | Admitting: Cardiology

## 2019-04-15 NOTE — Telephone Encounter (Signed)
Called and LVM for patient to call and schedule 3-4 week f/u with Dr Harrell Gave

## 2019-04-22 ENCOUNTER — Encounter: Payer: Medicare Other | Admitting: Obstetrics & Gynecology

## 2019-04-26 ENCOUNTER — Encounter: Payer: Self-pay | Admitting: Cardiology

## 2019-05-06 ENCOUNTER — Ambulatory Visit: Payer: Medicare PPO | Admitting: Cardiology

## 2019-05-18 ENCOUNTER — Encounter: Payer: Self-pay | Admitting: Cardiology

## 2019-05-18 ENCOUNTER — Other Ambulatory Visit: Payer: Self-pay

## 2019-05-18 ENCOUNTER — Ambulatory Visit: Payer: Medicare PPO | Admitting: Cardiology

## 2019-05-18 VITALS — BP 121/83 | HR 94 | Ht 64.0 in | Wt 216.0 lb

## 2019-05-18 DIAGNOSIS — I10 Essential (primary) hypertension: Secondary | ICD-10-CM

## 2019-05-18 DIAGNOSIS — E782 Mixed hyperlipidemia: Secondary | ICD-10-CM | POA: Diagnosis not present

## 2019-05-18 DIAGNOSIS — I2510 Atherosclerotic heart disease of native coronary artery without angina pectoris: Secondary | ICD-10-CM | POA: Diagnosis not present

## 2019-05-18 DIAGNOSIS — Z7189 Other specified counseling: Secondary | ICD-10-CM

## 2019-05-18 DIAGNOSIS — Z79899 Other long term (current) drug therapy: Secondary | ICD-10-CM | POA: Diagnosis not present

## 2019-05-18 NOTE — Progress Notes (Signed)
Cardiology Office Note:    Date:  05/18/2019   ID:  Kayla [name], DOB 11-18-1945, MRN [id number]  PCP:  Ladell Pier, MD  Cardiologist:  Buford Dresser, MD PhD  Referring MD: Ladell Pier, MD   CC: follow up  History of Present Illness:    Kayla [name] is a 74 y.o. female with a hx of mild CAD, HTN, HLD, IBS, hypothyroid, GERD, fibromyalgia, OA who is seen for follow up today. I first met her on 06/01/18. Seen initially by Rosaria Ferries 03/26/18 for hypertension and chest discomfort.  Today: Brought her home wrist cuff today. Her cuff showed 162/107 when our office number showed 121/83. Brings home logs, very large amounts of fluctuations, sometimes more than 40 points in a short period of time.   Has done well with chlorthalidone. LE edema resolved completely.   Continues to feel intermittent chest and whole body vibrating. We have discussed this previously, no electrical sensations, no clear cardiac etiology of this.  Denies chest pain, shortness of breath at rest or with normal exertion. No PND, orthopnea, LE edema or unexpected weight gain. No syncope or palpitations.  Past Medical History:  Diagnosis Date  . Anemia   . Anxiety   . Asthma   . B12 deficiency   . Bursitis   . Chronic LBP   . Chronic UTI   . Diastolic dysfunction   . Fibromyalgia   . GERD (gastroesophageal reflux disease)   . Hyperlipemia   . Hypertension   . Hypothyroid   . IBS (irritable bowel syndrome)   . Inflammatory osteoarthritis   . Kidney infection   . Psoriatic arthritis ([place])   . S/P cardiac cath 11/30/2007   normal coronaries - Dr. Burt Knack (Dr. Debara Pickett reviewed films on 01/10/2016)    Past Surgical History:  Procedure Laterality Date  . CESAREAN SECTION  06/10/1975  . CHOLECYSTECTOMY    . NECK SURGERY  1998  . repair broken C6 & C7      Current Medications: Current Outpatient Medications on File Prior to Visit  Medication Sig  . aspirin EC 81 MG tablet Take 1 tablet  (81 mg total) by mouth daily.  . Calcium-Magnesium-Zinc (CAL-MAG-ZINC PO) Take by mouth 2 (two) times daily.  . chlorthalidone (HYGROTON) 25 MG tablet Take 1 tablet (25 mg total) by mouth daily.  . cyanocobalamin ([employer] VITAMIN B12) 1000 MCG tablet Take 1 tablet (1,000 mcg total) by mouth daily.  Marland Kitchen diltiazem (TIAZAC) 360 MG 24 hr capsule Take 1 capsule (360 mg total) by mouth daily.  . ergocalciferol (VITAMIN D2) 1.25 MG (50000 UT) capsule Vitamin D2 1,250 mcg (50,000 unit) capsule  . ferrous sulfate 325 (65 FE) MG tablet ferrous sulfate 325 mg (65 mg iron) tablet  TAKE 1 TABLET BY MOUTH 2 TIMES DAILY WITH A MEAL  . levothyroxine (SYNTHROID) 125 MCG tablet Take 1 tablet (125 mcg total) by mouth daily before breakfast.  . Omega-3 Fatty Acids (FISH OIL) 1000 MG CAPS Take by mouth.  . pyridOXINE (VITAMIN B-6) 100 MG tablet Take 100 mg by mouth daily.   No current facility-administered medications on file prior to visit.     Allergies:   Ace inhibitors and Lisinopril   Social History   Tobacco Use  . Smoking status: Never Smoker  . Smokeless tobacco: Never Used  Substance Use Topics  . Alcohol use: No  . Drug use: No    Family History: The patient's family history includes Alcohol abuse in her  maternal aunt and maternal uncle; Alzheimer's disease in her mother and another family member; Brain cancer in her maternal grandmother; Cancer in her maternal aunt, maternal uncle, mother, paternal uncle, and paternal uncle; Congestive Heart Failure in her maternal grandfather; Heart attack in her paternal grandfather; Heart attack (age of onset: 49) in her father; Heart disease in her maternal grandfather; Leukemia in an other family member; Lung cancer in an other family member; Stroke in her mother. There is no history of Colon cancer, Breast cancer, or Diabetes.  ROS:   Please see the history of present illness.  Additional pertinent ROS negative except as noted.   EKGs/Labs/Other Studies  Reviewed:    The following studies were reviewed today: CTA 06-07-2018 1. 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 Technically, current recommendations call for annual follow-up imaging for thoracic aneurysms of 4 cm or larger. However, 10 year stability of the current aneurysm is certainly reassuring. Aortic aneurysm NOS (ICD10-I71.9), Aortic Atherosclerosis (ICD10-170.0) 2. Small hiatal hernia.   Echo 04/29/18 Left ventricle: The cavity size was normal. There was mild   concentric hypertrophy. Systolic function was vigorous. The   estimated ejection fraction was in the range of 65% to 70%. Wall   motion was normal; there were no regional wall motion   abnormalities. Doppler parameters are consistent with abnormal   left ventricular relaxation (grade 1 diastolic dysfunction). - Aortic valve: Valve area (VTI): 4.83 cm^2. Valve area (Vmax):   4.21 cm^2. Valve area (Vmean): 4.76 cm^2. - Left atrium: The atrium was mildly dilated. - Right ventricle: The cavity size was normal. Wall thickness was   normal. Systolic function was normal. - Right atrium: The atrium was normal in size. - Pulmonary arteries: Systolic pressure was within the normal   range. - Inferior vena cava: The vessel was normal in size. - Pericardium, extracardiac: There was no pericardial effusion.  Impressions: - Since the last study on 07/14/2014 there is new ascending aortic   aneurysm measuring 43 mm. A dedicated chest CTA or MRA is   recommended.  Monitor read 06/01/18 13 days of data recorded on Zio monitor. Patient had a min HR of 44 bpm, max HR of 162 bpm, and avg HR of 70 bpm. Predominant underlying rhythm was Sinus Rhythm. 6 episodes of SVT noted, longest lasted 18.4 seconds at 110 bpm. Fastest lasted only 6 beats at a max rate of 162 bpm. No VT, atrial  fibrillation, high degree block, or pauses noted. Isolated atrial and ventricular ectopy was rare (<1%). There were 19 patient triggered events. These were all sinus rhythm, rarely with PAC or PVC.   ECHO: 07/14/2014 - Left ventricle: The cavity size was normal. There was mild concentric hypertrophy. Systolic function was vigorous. The estimated ejection fraction was in the range of 65% to 70%. Wall motion was normal; there were no regional wall motion abnormalities. Doppler parameters are consistent with abnormal left ventricular relaxation (grade 1 diastolic dysfunction). There was no evidence of elevated ventricular filling pressure by Doppler parameters. - Aortic valve: Mildly thickened, mildly calcified leaflets. There was no regurgitation. - Ascending aorta: The ascending aorta was normal in size. - Mitral valve: Structurally normal valve. There was trivial regurgitation. - Left atrium: The atrium was normal in size. - Right ventricle: The cavity size was normal. Wall thickness was normal. Systolic pressure couldn&'t be assessed as there was  no tricuspid regurgitation. - Atrial septum: No defect or patent foramen ovale was identified. - Tricuspid valve: There was no significant regurgitation. - Pulmonic valve: There was no regurgitation. - Inferior vena cava: The vessel was normal in size. The respirophasic diameter changes were in the normal range (= 50%), consistent with normal central venous pressure.  MYOVIEW: 07/21/2014 Impression Exercise Capacity: [place] with no exercise. BP Response: Normal blood pressure response. Clinical Symptoms: Mild shortness of breath ECG Impression: No significant ST segment change suggestive of ischemia. Comparison with Prior Nuclear Study: No images to compare  Overall Impression: Normal stress nuclear study.  LV Wall Motion: NL LV Function, EF 72%; NL Wall Motion  EKG:  EKG is personally reviewed.  The  ekg ordered 11/05/18 demonstrates normal sinus rhythm  Recent Labs: 10/13/2018: Magnesium 2.3 11/02/2018: ALT 13; TSH 0.174 11/05/2018: Hemoglobin 14.7; Platelets 313 12/29/2018: BUN 13; Creatinine, Ser 1.00; Potassium 4.3; Sodium 145  Recent Lipid Panel    Component Value Date/Time   CHOL 166 11/02/2018 0827   TRIG 132 11/02/2018 0827   HDL 56 11/02/2018 0827   CHOLHDL 3.0 11/02/2018 0827   CHOLHDL 6.4 (H) 01/01/2016 1012   VLDL 58 (H) 01/01/2016 1012   LDLCALC 84 11/02/2018 0827   LDLDIRECT 169.8 04/15/2007 1017    Physical Exam:    VS:  BP 121/83   Pulse 94   Ht 5' 4" (1.626 m)   Wt 216 lb (98 kg)   SpO2 98%   BMI 37.08 kg/m     Wt Readings from Last 3 Encounters:  05/18/19 216 lb (98 kg)  04/14/19 218 lb (98.9 kg)  03/03/19 215 lb 12.8 oz (97.9 kg)    GEN: Well nourished, well developed in no acute distress HEENT: Normal, moist mucous membranes NECK: No JVD CARDIAC: regular rhythm, normal S1 and S2, no rubs or gallops. No murmur. VASCULAR: Radial and DP pulses 2+ bilaterally. No carotid bruits RESPIRATORY:  Clear to auscultation without rales, wheezing or rhonchi  ABDOMEN: Soft, non-tender, non-distended MUSCULOSKELETAL:  Ambulates independently SKIN: Warm and dry, no edema NEUROLOGIC:  Alert and oriented x 3. No focal neuro deficits noted. PSYCHIATRIC:  Normal affect   ASSESSMENT:    1. Essential hypertension   2. Medication management   3. Nonocclusive coronary atherosclerosis of native coronary artery   4. Mixed hyperlipidemia   5. Counseling on health promotion and disease prevention   6. Cardiac risk counseling    PLAN:    Hypertension: much improved in the office, but her home cuff is 40 points off, and home numbers vary widely. -recommended she get new home blood pressure cuff given the difference compared to office numbers. -Goal is <130/80.  -she is very concerned re: history with prior kidneys. Will not take ACEi, ARB, MRA.  -she is tolerating  chlorthalidone. Check BMET today -tried amlodipine instead of diltiazem, but had side effects (itching, throbbing in arm) so changed back to diltiazem -would be cautious about beta blockers given fatigue  Nonobstructive CAD:  -aspirin: takes intermittently -statin: has been on in the past, self-discontinued, declined restarting multiple times in the past.  -discussed red flag warning signs that need immediate medical attention  History of TAA: stable on imaging (10 years between)  CV risk factors: -hyperlipidemia: LDL goal <70, last LDL 84, TG 132.  -A1c 5.6, no history of diabetes -has family history of CAD, increases her risk -class 2 obesity, with serious side effect of CAD: would benefit from weight loss.   CV  risk prevention counseling -recommend heart healthy/Mediterranean diet, with whole grains, fruits, vegetable, fish, lean meats, nuts, and olive oil. Limit salt. -recommend moderate walking, 3-5 times/week for 30-50 minutes each session. Aim for at least 150 minutes.week. Goal should be pace of 3 miles/hours, or walking 1.5 miles in 30 minutes -recommend avoidance of tobacco products. Avoid excess alcohol.  Covid 19 vaccine: we discussed. She declines.  Plan for follow up: 1 year or sooner as needed  Medication Adjustments/Labs and Tests Ordered: Current medicines are reviewed at length with the patient today.  Concerns regarding medicines are outlined above.  Orders Placed This Encounter  Procedures  . Basic metabolic panel   No orders of the defined types were placed in this encounter.   Patient Instructions  Medication Instructions:  Your Physician recommend you continue on your current medication as directed.    *If you need a refill on your cardiac medications before your next appointment, please call your pharmacy*  Lab Work: Your physician recommends that you return for lab work today (BMP)  If you have labs (blood work) drawn today and your tests are  completely normal, you will receive your results only by: Marland Kitchen MyChart Message (if you have MyChart) OR . A paper copy in the mail If you have any lab test that is abnormal or we need to change your treatment, we will call you to review the results.  Testing/Procedures: None  Follow-Up: At [facility], you and your health needs are our priority.  As part of our continuing mission to provide you with exceptional heart care, we have created designated Provider Care Teams.  These Care Teams include your primary Cardiologist (physician) and Advanced Practice Providers (APPs -  Physician Assistants and Nurse Practitioners) who all work together to provide you with the care you need, when you need it.  Your next appointment:   1 year(s)  The format for your next appointment:   In Person  Provider:   Buford Dresser, MD       Signed, Buford Dresser, MD PhD 05/18/2019   [place]

## 2019-05-18 NOTE — Patient Instructions (Signed)
Medication Instructions:  Your Physician recommend you continue on your current medication as directed.    *If you need a refill on your cardiac medications before your next appointment, please call your pharmacy*  Lab Work: Your physician recommends that you return for lab work today (BMP)  If you have labs (blood work) drawn today and your tests are completely normal, you will receive your results only by: Marland Kitchen MyChart Message (if you have MyChart) OR . A paper copy in the mail If you have any lab test that is abnormal or we need to change your treatment, we will call you to review the results.  Testing/Procedures: None  Follow-Up: At [name], you and your health needs are our priority.  As part of our continuing mission to provide you with exceptional heart care, we have created designated Provider Care Teams.  These Care Teams include your primary Cardiologist (physician) and Advanced Practice Providers (APPs -  Physician Assistants and Nurse Practitioners) who all work together to provide you with the care you need, when you need it.  Your next appointment:   1 year(s)  The format for your next appointment:   In Person  Provider:   Buford Dresser, MD

## 2019-05-19 ENCOUNTER — Telehealth: Payer: Self-pay | Admitting: Cardiology

## 2019-05-19 LAB — BASIC METABOLIC PANEL
BUN/Creatinine Ratio: 11 — ABNORMAL LOW (ref 12–28)
BUN: 13 mg/dL (ref 8–27)
CO2: 23 mmol/L (ref 20–29)
Calcium: 9.5 mg/dL (ref 8.7–10.3)
Chloride: 106 mmol/L (ref 96–106)
Creatinine, Ser: 1.19 mg/dL — ABNORMAL HIGH (ref 0.57–1.00)
GFR calc Af Amer: 52 mL/min/{1.73_m2} — ABNORMAL LOW (ref >59)
GFR calc non Af Amer: 45 mL/min/{1.73_m2} — ABNORMAL LOW (ref >59)
Glucose: 105 mg/dL — ABNORMAL HIGH (ref 65–99)
Potassium: 4.2 mmol/L (ref 3.5–5.2)
Sodium: 143 mmol/L (ref 134–144)

## 2019-05-19 NOTE — Telephone Encounter (Signed)
I have commented on her labs. Her kidney function is stable from prior. It is normal to have a small amount of fluctuation, and this is the same as two kidney tests ago. Thanks.

## 2019-05-19 NOTE — Telephone Encounter (Signed)
Patient would like Alisha to call her to go over her Lab results. She got her lab results on MyChart today and has some questions for Dr. Harrell Gave and Lars Mage.  The patient is worried that her kidney disease is coming back

## 2019-05-19 NOTE — Telephone Encounter (Signed)
LM2CB regarding Dr. Judeth Cornfield comments

## 2019-05-20 NOTE — Telephone Encounter (Signed)
Left message to call back  

## 2019-05-20 NOTE — Telephone Encounter (Signed)
Pt updated and verbalized understanding.  

## 2019-05-25 ENCOUNTER — Ambulatory Visit: Payer: Medicare PPO | Admitting: Internal Medicine

## 2019-05-27 ENCOUNTER — Ambulatory Visit: Payer: Medicare PPO | Admitting: Internal Medicine

## 2019-06-02 ENCOUNTER — Ambulatory Visit: Payer: Self-pay | Admitting: Family Medicine

## 2019-06-02 ENCOUNTER — Ambulatory Visit: Payer: Medicare PPO | Admitting: Internal Medicine

## 2019-06-02 NOTE — Telephone Encounter (Signed)
error 

## 2019-07-06 ENCOUNTER — Telehealth: Payer: Self-pay

## 2019-07-06 NOTE — Telephone Encounter (Signed)
Called patient to do their pre-visit COVID screening.  Call went to voicemail, which is full. Unable to do prescreening.  

## 2019-07-07 ENCOUNTER — Other Ambulatory Visit: Payer: Self-pay

## 2019-07-07 ENCOUNTER — Ambulatory Visit (INDEPENDENT_AMBULATORY_CARE_PROVIDER_SITE_OTHER): Payer: Medicare PPO | Admitting: Internal Medicine

## 2019-07-07 ENCOUNTER — Encounter: Payer: Self-pay | Admitting: Internal Medicine

## 2019-07-07 VITALS — BP 142/85 | HR 117 | Temp 97.3°F | Resp 17 | Wt 211.0 lb

## 2019-07-07 DIAGNOSIS — I10 Essential (primary) hypertension: Secondary | ICD-10-CM

## 2019-07-07 DIAGNOSIS — R319 Hematuria, unspecified: Secondary | ICD-10-CM

## 2019-07-07 DIAGNOSIS — E2839 Other primary ovarian failure: Secondary | ICD-10-CM | POA: Diagnosis not present

## 2019-07-07 DIAGNOSIS — Z1231 Encounter for screening mammogram for malignant neoplasm of breast: Secondary | ICD-10-CM | POA: Diagnosis not present

## 2019-07-07 DIAGNOSIS — Z1211 Encounter for screening for malignant neoplasm of colon: Secondary | ICD-10-CM

## 2019-07-07 DIAGNOSIS — R0600 Dyspnea, unspecified: Secondary | ICD-10-CM

## 2019-07-07 DIAGNOSIS — E039 Hypothyroidism, unspecified: Secondary | ICD-10-CM | POA: Diagnosis not present

## 2019-07-07 NOTE — Patient Instructions (Signed)
Thank you for choosing Primary Care at [facility] to be your medical home!    Kayla [name] was seen by Melina Schools, DO today.   Kayla [name] primary care provider is Phill Myron, DO.   For the best care possible, you should try to see Phill Myron, DO whenever you come to the clinic.   We look forward to seeing you again soon!  If you have any questions about your visit today, please call us at [phone] or feel free to reach your primary care provider via [place].

## 2019-07-07 NOTE — Progress Notes (Signed)
Subjective:    Kayla [name] - 74 y.o. female MRN [id number]  Date of birth: 13-Jul-1945  HPI  [name] is here for thyroid follow up. Patient takes Synthroid 125 mcg daily. Previous TSH results have all been low, last check in July 2020. At that time her Synthroid dose was decreased. She endorses extreme fatigue. No fatigue, changes in hair/nails, hot/cold intolerance, or unintended weight changes.    Reports some bleeding on her pad that she wears. Notices it in various spots on her the pad. She isn't sure if it is vaginal or from her bladder. She does not see bright red blood in stools but does have black tarry stool which she attributes to her Fe supplement. She had to reschedule her OB appointment because her brother had emergency surgery. Endorses random dysuria when she urinates.   Chronic HTN Disease Monitoring:  Home BP Monitoring - Does not monitor because her cuff was significantly different from her cardiologist's cuff by about 30 points.  Chest pain- no  Dyspnea- no Headache - no  Medications: Chlorthalidone 25 mg, Diltiazem 360 mg  Compliance- yes Lightheadedness- no  Edema- no       Health Maintenance:  Health Maintenance Due  Topic Date Due  . MAMMOGRAM  04/08/2006  . DEXA SCAN  Never done  . COLONOSCOPY  05/19/2012  . TETANUS/TDAP  04/08/2013    -  reports that she has never smoked. She has never used smokeless tobacco. - Review of Systems: Per HPI. - Past Medical History: Patient Active Problem List   Diagnosis Date Noted  . Renal insufficiency 12/29/2018  . Cyst of left ovary 12/29/2018  . Esophageal dysmotility 12/29/2018  . 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 Acquired hypothyroidism 12/29/2018  . Chronic maxillary sinusitis 07/07/2018  . Snoring 06/01/2018  . Costochondritis 06/01/2018  . Family history of heart disease 06/01/2018  . Class 2 severe obesity due to excess calories with serious comorbidity  and body mass index (BMI) of 38.0 to 38.9 in adult ([name]) 06/01/2018  . Multiple atypical skin moles 04/28/2017  . Depression, recurrent ([name]) 07/16/2016  . Adjustment disorder with mixed anxiety and depressed mood 01/15/2016  . Fe Def Anemia 12/22/2015  . Scoliosis (and kyphoscoliosis), idiopathic 12/20/2015  . Osteopenia 12/20/2015  . Fibromyalgia 08/02/2014  . GAD (generalized anxiety disorder) 09/17/2012  . Vitamin D deficiency 01/24/2012  . Insomnia 01/24/2012  . Fatigue 10/14/2011  . B12 deficiency   . Hyperlipidemia 11/30/2007  . Coronary artery disease involving native heart without angina pectoris 11/30/2007  . Essential hypertension 01/27/2007  . Reactive airway disease that is not asthma 01/27/2007  . GERD 01/27/2007  . IBS 01/27/2007   - Medications: reviewed and updated   Objective:   Physical Exam BP (!) 142/85   Pulse (!) 117   Temp (!) 97.3 F (36.3 C) (Temporal)   Resp 17   Wt 211 lb (95.7 kg)   SpO2 98%   BMI 36.22 kg/m  Physical Exam  Constitutional: She is oriented to person, place, and time and well-developed, well-nourished, and in no distress. No distress.  Cardiovascular: Normal rate.  Pulmonary/Chest: Effort normal. No respiratory distress.  Musculoskeletal:        General: Normal range of motion.  Neurological: She is alert and oriented to person, place, and time.  Skin: Skin is warm and dry. She is not diaphoretic.  Psychiatric: Affect and judgment normal.           Assessment &  Plan:   1. Acquired hypothyroidism - Thyroid Panel With TSH  2. Breast cancer screening by mammogram - MM Digital Screening; Future  3. Colon cancer screening - Ambulatory referral to Gastroenterology  4. Estrogen deficiency - DG Bone Density; Future  5. Essential hypertension - Comprehensive metabolic panel  6. Hematuria, unspecified type Symptoms concerning for hematuria vs. AUB. Will obtain UA with micro and urine culture. History complicated by  the fact that patient does have intermittent symptoms that could be related to cystitis. If UA shows blood and culture grows bacteria, will plan to treat for presumed infection and then repeat UA after treatment. If after treatment still has hematuria would warrant referral to urologist. If urine culture negative and UA with RBCs, will refer promptly. If all urine studies negative, would recommend patient be seen by OB for work up with ultrasound and endometrial biopsy.  - Urine Culture - Urinalysis, Complete  7. Nocturnal dyspnea Patient with history of snoring, nighttime awakenings gasping for breath, extreme fatigue, and headaches. Constellation of symptoms is concerning for sleep apnea. Patient reports she has had sleep studies recommended in past but has not had one performed. Will refer to Dr. Joya Gaskins. Patient had echo in Jan 2020 with EF of 65-70% and only G1DD so low suspicion CHF etiology of symptoms. Patient does not have known h/o COPD or asthma.       Phill Myron, D.O. 07/07/2019, 3:23 PM Primary Care at [facility]

## 2019-07-08 LAB — COMPREHENSIVE METABOLIC PANEL
ALT: 14 IU/L (ref 0–32)
AST: 22 IU/L (ref 0–40)
Albumin/Globulin Ratio: 1.6 (ref 1.2–2.2)
Albumin: 4.5 g/dL (ref 3.7–4.7)
Alkaline Phosphatase: 107 IU/L (ref 39–117)
BUN/Creatinine Ratio: 9 — ABNORMAL LOW (ref 12–28)
BUN: 11 mg/dL (ref 8–27)
Bilirubin Total: 0.5 mg/dL (ref 0.0–1.2)
CO2: 24 mmol/L (ref 20–29)
Calcium: 9.8 mg/dL (ref 8.7–10.3)
Chloride: 100 mmol/L (ref 96–106)
Creatinine, Ser: 1.22 mg/dL — ABNORMAL HIGH (ref 0.57–1.00)
GFR calc Af Amer: 50 mL/min/{1.73_m2} — ABNORMAL LOW (ref >59)
GFR calc non Af Amer: 44 mL/min/{1.73_m2} — ABNORMAL LOW (ref >59)
Globulin, Total: 2.8 g/dL (ref 1.5–4.5)
Glucose: 124 mg/dL — ABNORMAL HIGH (ref 65–99)
Potassium: 3.4 mmol/L — ABNORMAL LOW (ref 3.5–5.2)
Sodium: 140 mmol/L (ref 134–144)
Total Protein: 7.3 g/dL (ref 6.0–8.5)

## 2019-07-08 LAB — THYROID PANEL WITH TSH
Free Thyroxine Index: 3.3 (ref 1.2–4.9)
T3 Uptake Ratio: 30 % (ref 24–39)
T4, Total: 11 ug/dL (ref 4.5–12.0)
TSH: 0.36 u[IU]/mL — ABNORMAL LOW (ref 0.450–4.500)

## 2019-07-09 LAB — MICROSCOPIC EXAMINATION
Bacteria, UA: NONE SEEN
Casts: NONE SEEN /lpf
RBC, Urine: NONE SEEN /hpf (ref 0–2)
WBC, UA: NONE SEEN /hpf (ref 0–5)

## 2019-07-09 LAB — URINE CULTURE

## 2019-07-09 LAB — URINALYSIS, COMPLETE
Bilirubin, UA: NEGATIVE
Glucose, UA: NEGATIVE
Ketones, UA: NEGATIVE
Leukocytes,UA: NEGATIVE
Nitrite, UA: NEGATIVE
Protein,UA: NEGATIVE
RBC, UA: NEGATIVE
Specific Gravity, UA: 1.008 (ref 1.005–1.030)
Urobilinogen, Ur: 0.2 mg/dL (ref 0.2–1.0)
pH, UA: 6 (ref 5.0–7.5)

## 2019-07-12 ENCOUNTER — Other Ambulatory Visit: Payer: Self-pay | Admitting: Internal Medicine

## 2019-07-12 DIAGNOSIS — E039 Hypothyroidism, unspecified: Secondary | ICD-10-CM

## 2019-07-12 DIAGNOSIS — E876 Hypokalemia: Secondary | ICD-10-CM

## 2019-07-12 MED ORDER — LEVOTHYROXINE SODIUM 112 MCG PO TABS: 112.0000 ug | ORAL_TABLET | Freq: Every day | ORAL | 3 refills | Status: DC

## 2019-08-10 ENCOUNTER — Ambulatory Visit
Admission: EM | Admit: 2019-08-10 | Discharge: 2019-08-10 | Disposition: A | Discharge status: Home / Self Care | Payer: Medicare PPO | Attending: Emergency Medicine | Admitting: Emergency Medicine

## 2019-08-10 ENCOUNTER — Other Ambulatory Visit: Payer: Self-pay

## 2019-08-10 ENCOUNTER — Encounter: Payer: Self-pay | Admitting: Emergency Medicine

## 2019-08-10 DIAGNOSIS — R32 Unspecified urinary incontinence: Secondary | ICD-10-CM | POA: Insufficient documentation

## 2019-08-10 DIAGNOSIS — R1030 Lower abdominal pain, unspecified: Secondary | ICD-10-CM | POA: Diagnosis not present

## 2019-08-10 DIAGNOSIS — L309 Dermatitis, unspecified: Secondary | ICD-10-CM | POA: Diagnosis present

## 2019-08-10 LAB — POCT URINALYSIS DIP (MANUAL ENTRY)
Bilirubin, UA: NEGATIVE
Blood, UA: NEGATIVE
Glucose, UA: NEGATIVE mg/dL
Ketones, POC UA: NEGATIVE mg/dL
Nitrite, UA: NEGATIVE
Protein Ur, POC: NEGATIVE mg/dL
Spec Grav, UA: 1.015 (ref 1.010–1.025)
Urobilinogen, UA: 0.2 E.U./dL
pH, UA: 5.5 (ref 5.0–8.0)

## 2019-08-10 MED ORDER — TRIAMCINOLONE ACETONIDE 0.5 % EX OINT
1.0000 | TOPICAL_OINTMENT | Freq: Two times a day (BID) | CUTANEOUS | 0 refills | Status: DC
Start: 2019-08-10 — End: 2020-02-17

## 2019-08-10 NOTE — ED Provider Notes (Signed)
[name]    CSN: [id number] Arrival date & time: 08/10/19  1656      History   Chief Complaint Chief Complaint  Patient presents with  . Urinary Tract Infection    HPI RUDOLPH DOBLER is a 74 y.o. female with history of hypertension, hypothyroidism, fibromyalgia presenting for possible UTI.  States she had some low back stiffness and pain yesterday.  No urinary symptoms such as frequency, urgency, burning or pain.  Denies vaginal or pelvic pain, vaginal discharge.  Did a [facility] test kit which indicated infection.  Patient largely concerned given history of sepsis second to pyelonephritis.  No fever, nausea, vomiting, blood in urine, change in bowel habit.   Past Medical History:  Diagnosis Date  . Anemia   . Anxiety   . Asthma   . B12 deficiency   . Bursitis   . Chronic LBP   . Chronic UTI   . Diastolic dysfunction   . Fibromyalgia   . GERD (gastroesophageal reflux disease)   . Hyperlipemia   . Hypertension   . Hypothyroid   . IBS (irritable bowel syndrome)   . Inflammatory osteoarthritis   . Kidney infection   . Psoriatic arthritis ([place])   . S/P cardiac cath 11/30/2007   normal coronaries - Dr. Burt Knack (Dr. Debara Pickett reviewed films on 01/10/2016)    Patient Active Problem List   Diagnosis Date Noted  . Renal insufficiency 12/29/2018  . Cyst of left ovary 12/29/2018  . Esophageal dysmotility 12/29/2018  . 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 Acquired hypothyroidism 12/29/2018  . Chronic maxillary sinusitis 07/07/2018  . Snoring 06/01/2018  . Costochondritis 06/01/2018  . Family history of heart disease 06/01/2018  . Class 2 severe obesity due to excess calories with serious comorbidity and body mass index (BMI) of 38.0 to 38.9 in adult ([place]) 06/01/2018  . Multiple atypical skin moles 04/28/2017  . Depression, recurrent ([place]) 07/16/2016  . Adjustment disorder with mixed anxiety and depressed mood 01/15/2016  . Fe Def Anemia  12/22/2015  . Scoliosis (and kyphoscoliosis), idiopathic 12/20/2015  . Osteopenia 12/20/2015  . Fibromyalgia 08/02/2014  . GAD (generalized anxiety disorder) 09/17/2012  . Vitamin D deficiency 01/24/2012  . Insomnia 01/24/2012  . Fatigue 10/14/2011  . B12 deficiency   . Hyperlipidemia 11/30/2007  . Coronary artery disease involving native heart without angina pectoris 11/30/2007  . Essential hypertension 01/27/2007  . Reactive airway disease that is not asthma 01/27/2007  . GERD 01/27/2007  . IBS 01/27/2007    Past Surgical History:  Procedure Laterality Date  . CESAREAN SECTION  06/10/1975  . CHOLECYSTECTOMY    . NECK SURGERY  1998  . repair broken C6 & C7      OB History   No obstetric history on file.      Home Medications    Prior to Admission medications   Medication Sig Start Date End Date Taking? Authorizing Provider  aspirin EC 81 MG tablet Take 1 tablet (81 mg total) by mouth daily. 12/15/18  Yes Buford Dresser, MD  chlorthalidone (HYGROTON) 25 MG tablet Take 1 tablet (25 mg total) by mouth daily. 04/14/19 04/08/20 Yes Buford Dresser, MD  ferrous sulfate 325 (65 FE) MG tablet ferrous sulfate 325 mg (65 mg iron) tablet  TAKE 1 TABLET BY MOUTH 2 TIMES DAILY WITH A MEAL   Yes [provider]  levothyroxine (SYNTHROID) 112 MCG tablet Take 1 tablet (112 mcg total) by mouth daily. 07/12/19  Yes Nicolette Bang,  DO  Omega-3 Fatty Acids (FISH OIL) 1000 MG CAPS Take by mouth. 01/15/16  Yes [provider]  POTASSIUM CHLORIDE PO Take by mouth.   Yes [provider]  pyridOXINE (VITAMIN B-6) 100 MG tablet Take 100 mg by mouth daily.   Yes [provider]  Calcium-Magnesium-Zinc (CAL-MAG-ZINC PO) Take by mouth 2 (two) times daily.    [provider]  cyanocobalamin ([facility] VITAMIN B12) 1000 MCG tablet Take 1 tablet (1,000 mcg total) by mouth daily. 03/02/19   Ladell Pier, MD  diltiazem (TIAZAC) 360 MG 24 hr  capsule Take 1 capsule (360 mg total) by mouth daily. 03/09/19   Buford Dresser, MD  triamcinolone ointment (KENALOG) 0.5 % Apply 1 application topically 2 (two) times daily. 08/10/19   Hall-Potvin, [place], PA-C    Family History Family History  Problem Relation Age of Onset  . Heart attack Father 86  . Lung cancer Other        uncle, non smoker  . Leukemia Other        uncle  . Cancer Mother        oral  . Stroke Mother   . Alzheimer's disease Mother        at 28  . Cancer Maternal Aunt        breast  . Alcohol abuse Maternal Aunt   . Alcohol abuse Maternal Uncle   . Cancer Maternal Uncle        lung, stomach, oral  . Cancer Paternal Uncle        lung  . Congestive Heart Failure Maternal Grandfather   . Heart disease Maternal Grandfather   . Heart attack Paternal Grandfather   . Cancer Paternal Uncle        bone  . Brain cancer Maternal Grandmother   . Alzheimer's disease Other        maternal great grandmother   . Colon cancer Neg Hx   . Breast cancer Neg Hx   . Diabetes Neg Hx     Social History Social History   Tobacco Use  . Smoking status: Never Smoker  . Smokeless tobacco: Never Used  Substance Use Topics  . Alcohol use: No  . Drug use: No     Allergies   Ace inhibitors and Lisinopril   Review of Systems As per HPI   Physical Exam Triage Vital Signs ED Triage Vitals [08/10/19 1712]  Enc Vitals Group     BP      Pulse      Resp      Temp      Temp src      SpO2      Weight      Height      Head Circumference      Peak Flow      Pain Score 8     Pain Loc      Pain Edu?      Excl. in [place]?    No data found.  Updated Vital Signs BP 136/87 (BP Location: Right Arm)   Pulse 74   Temp (!) 97.5 F (36.4 C) (Oral)   Resp 20   SpO2 96%   Visual Acuity Right Eye Distance:   Left Eye Distance:   Bilateral Distance:    Right Eye Near:   Left Eye Near:    Bilateral Near:     Physical Exam Constitutional:      General: She  is not in acute distress. HENT:  Head: Normocephalic and atraumatic.  Eyes:     General: No scleral icterus.    Pupils: Pupils are equal, round, and reactive to light.  Cardiovascular:     Rate and Rhythm: Normal rate.  Pulmonary:     Effort: Pulmonary effort is normal.  Abdominal:     General: Bowel sounds are normal.     Palpations: Abdomen is soft.     Tenderness: There is no abdominal tenderness. There is no right CVA tenderness, left CVA tenderness or guarding.  Skin:    Coloration: Skin is not jaundiced or pale.  Neurological:     Mental Status: She is alert and oriented to person, place, and time.      UC Treatments / Results  Labs (all labs ordered are listed, but only abnormal results are displayed) Labs Reviewed  POCT URINALYSIS DIP (MANUAL ENTRY) - Abnormal; Notable for the following components:      Result Value   Leukocytes, UA Trace (*)    All other components within normal limits  URINE CULTURE    EKG   Radiology No results found.  Procedures Procedures (including critical care time)  Medications Ordered in UC Medications - No data to display  Initial Impression / Assessment and Plan / UC Course  I have reviewed the triage vital signs and the nursing notes.  Pertinent labs & imaging results that were available during my care of the patient were reviewed by me and considered in my medical decision making (see chart for details).     Patient afebrile, nontoxic in office today.  Urine dipstick significant for trace leukocytes.  Culture pending.  Will withhold treatment until urine culture results as she has had most recent urine culture with multiple flora not suggestive of infection.  Patient verbalizes disappointment with previous urologist: Contact information provided for new urologist.  At end of visit, patient inquires about left form dermatitis.  States she has been seen by her dermatologist for this and has been compliant with hydrocortisone  cream, though feels it spreading.  Patient does have mild dermatitis over distal aspect of left dorsal forearm.  Will increase to triamcinolone, have patient follow-up with dermatology.  Return precautions discussed, patient verbalized understanding and is agreeable to plan. Final Clinical Impressions(s) / UC Diagnoses   Final diagnoses:  Lower abdominal pain  Urinary incontinence, unspecified type  Dermatitis     Discharge Instructions     Urine culture pending: we will call you in an antibiotic if needed. Important to follow up with urology about recurrent UTI and urinary incontinence.    ED Prescriptions    Medication Sig Dispense Auth. Provider   triamcinolone ointment (KENALOG) 0.5 % Apply 1 application topically 2 (two) times daily. 30 g Hall-Potvin, [place], PA-C     PDMP not reviewed this encounter.   Neldon Mc [place], [place] 08/10/19 1904

## 2019-08-10 NOTE — Discharge Instructions (Signed)
Urine culture pending: we will call you in an antibiotic if needed. Important to follow up with urology about recurrent UTI and urinary incontinence.

## 2019-08-10 NOTE — ED Triage Notes (Signed)
History of uti.  Yesterday had back stiffness and pain.  Last night started having pressure and cramping in abdomen.  Went to [facility] for [employer] and it indicated infection.

## 2019-08-12 LAB — URINE CULTURE: Culture: 10000 — AB

## 2019-08-17 ENCOUNTER — Ambulatory Visit
Admission: EM | Admit: 2019-08-17 | Discharge: 2019-08-17 | Disposition: A | Discharge status: Home / Self Care | Payer: Medicare PPO | Attending: Physician Assistant | Admitting: Physician Assistant

## 2019-08-17 DIAGNOSIS — H9202 Otalgia, left ear: Secondary | ICD-10-CM

## 2019-08-17 DIAGNOSIS — J3489 Other specified disorders of nose and nasal sinuses: Secondary | ICD-10-CM | POA: Diagnosis not present

## 2019-08-17 MED ORDER — FLUTICASONE PROPIONATE 50 MCG/ACT NA SUSP
2.0000 | Freq: Every day | NASAL | 0 refills | Status: DC
Start: 2019-08-17 — End: 2019-08-17

## 2019-08-17 MED ORDER — PREDNISONE 50 MG PO TABS
50.0000 mg | ORAL_TABLET | Freq: Every day | ORAL | 0 refills | Status: DC
Start: 2019-08-17 — End: 2019-09-14

## 2019-08-17 MED ORDER — FLUTICASONE PROPIONATE 50 MCG/ACT NA SUSP
2.0000 | Freq: Every day | NASAL | 0 refills | Status: DC
Start: 2019-08-17 — End: 2020-02-17

## 2019-08-17 MED ORDER — PREDNISONE 50 MG PO TABS
50.0000 mg | ORAL_TABLET | Freq: Every day | ORAL | 0 refills | Status: DC
Start: 2019-08-17 — End: 2019-08-17

## 2019-08-17 NOTE — ED Triage Notes (Signed)
Pt c/o lt side sinus pressure under lt eye, lt ear and lt side of neck x2 days

## 2019-08-17 NOTE — ED Provider Notes (Signed)
[name]    CSN: [id number] Arrival date & time: 08/17/19  1903      History   Chief Complaint Chief Complaint  Patient presents with  . Facial Pain    HPI Kayla [name] is a 74 y.o. female.   74 year old female comes in for 2 day of left sided sinus pressure, left ear pain. States history of seasonal allergies, and has had chronic rhinorrhea, nasal congestion. Denies cough. Denies fever, chills, body aches. Denies abdominal pain, nausea, vomiting, diarrhea. Denies shortness of breath, loss of taste/smell. Denies rashes, burning sensation.     Past Medical History:  Diagnosis Date  . Anemia   . Anxiety   . Asthma   . B12 deficiency   . Bursitis   . Chronic LBP   . Chronic UTI   . Diastolic dysfunction   . Fibromyalgia   . GERD (gastroesophageal reflux disease)   . Hyperlipemia   . Hypertension   . Hypothyroid   . IBS (irritable bowel syndrome)   . Inflammatory osteoarthritis   . Kidney infection   . Psoriatic arthritis ([place])   . S/P cardiac cath 11/30/2007   normal coronaries - Dr. Burt Knack (Dr. Debara Pickett reviewed films on 01/10/2016)    Patient Active Problem List   Diagnosis Date Noted  . Renal insufficiency 12/29/2018  . Cyst of left ovary 12/29/2018  . Esophageal dysmotility 12/29/2018  . 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 Acquired hypothyroidism 12/29/2018  . Chronic maxillary sinusitis 07/07/2018  . Snoring 06/01/2018  . Costochondritis 06/01/2018  . Family history of heart disease 06/01/2018  . Class 2 severe obesity due to excess calories with serious comorbidity and body mass index (BMI) of 38.0 to 38.9 in adult ([place]) 06/01/2018  . Multiple atypical skin moles 04/28/2017  . Depression, recurrent ([place]) 07/16/2016  . Adjustment disorder with mixed anxiety and depressed mood 01/15/2016  . Fe Def Anemia 12/22/2015  . Scoliosis (and kyphoscoliosis), idiopathic 12/20/2015  . Osteopenia 12/20/2015  . Fibromyalgia  08/02/2014  . GAD (generalized anxiety disorder) 09/17/2012  . Vitamin D deficiency 01/24/2012  . Insomnia 01/24/2012  . Fatigue 10/14/2011  . B12 deficiency   . Hyperlipidemia 11/30/2007  . Coronary artery disease involving native heart without angina pectoris 11/30/2007  . Essential hypertension 01/27/2007  . Reactive airway disease that is not asthma 01/27/2007  . GERD 01/27/2007  . IBS 01/27/2007    Past Surgical History:  Procedure Laterality Date  . CESAREAN SECTION  06/10/1975  . CHOLECYSTECTOMY    . NECK SURGERY  1998  . repair broken C6 & C7      OB History   No obstetric history on file.      Home Medications    Prior to Admission medications   Medication Sig Start Date End Date Taking? Authorizing Provider  aspirin EC 81 MG tablet Take 1 tablet (81 mg total) by mouth daily. 12/15/18   Buford Dresser, MD  Calcium-Magnesium-Zinc (CAL-MAG-ZINC PO) Take by mouth 2 (two) times daily.    [provider]  chlorthalidone (HYGROTON) 25 MG tablet Take 1 tablet (25 mg total) by mouth daily. 04/14/19 04/08/20  Buford Dresser, MD  cyanocobalamin ([employer] VITAMIN B12) 1000 MCG tablet Take 1 tablet (1,000 mcg total) by mouth daily. 03/02/19   Ladell Pier, MD  diltiazem (TIAZAC) 360 MG 24 hr capsule Take 1 capsule (360 mg total) by mouth daily. 03/09/19   Buford Dresser, MD  ferrous sulfate 325 (65 FE) MG  tablet ferrous sulfate 325 mg (65 mg iron) tablet  TAKE 1 TABLET BY MOUTH 2 TIMES DAILY WITH A MEAL    [provider]  fluticasone (FLONASE) 50 MCG/ACT nasal spray Place 2 sprays into both nostrils daily. 08/17/19   Ok Edwards, PA-C  levothyroxine (SYNTHROID) 112 MCG tablet Take 1 tablet (112 mcg total) by mouth daily. 07/12/19   Nicolette Bang, DO  Omega-3 Fatty Acids (FISH OIL) 1000 MG CAPS Take by mouth. 01/15/16   [provider]  POTASSIUM CHLORIDE PO Take by mouth.    [provider]  predniSONE (DELTASONE) 50  MG tablet Take 1 tablet (50 mg total) by mouth daily with breakfast. 08/17/19   Tasia Catchings, Tashianna Broome V, PA-C  pyridOXINE (VITAMIN B-6) 100 MG tablet Take 100 mg by mouth daily.    [provider]  triamcinolone ointment (KENALOG) 0.5 % Apply 1 application topically 2 (two) times daily. 08/10/19   Hall-Potvin, [place], PA-C    Family History Family History  Problem Relation Age of Onset  . Heart attack Father 49  . Lung cancer Other        uncle, non smoker  . Leukemia Other        uncle  . Cancer Mother        oral  . Stroke Mother   . Alzheimer's disease Mother        at 55  . Cancer Maternal Aunt        breast  . Alcohol abuse Maternal Aunt   . Alcohol abuse Maternal Uncle   . Cancer Maternal Uncle        lung, stomach, oral  . Cancer Paternal Uncle        lung  . Congestive Heart Failure Maternal Grandfather   . Heart disease Maternal Grandfather   . Heart attack Paternal Grandfather   . Cancer Paternal Uncle        bone  . Brain cancer Maternal Grandmother   . Alzheimer's disease Other        maternal great grandmother   . Colon cancer Neg Hx   . Breast cancer Neg Hx   . Diabetes Neg Hx     Social History Social History   Tobacco Use  . Smoking status: Never Smoker  . Smokeless tobacco: Never Used  Substance Use Topics  . Alcohol use: No  . Drug use: No     Allergies   Ace inhibitors and Lisinopril   Review of Systems Review of Systems  Reason unable to perform ROS: See HPI as above.     Physical Exam Triage Vital Signs ED Triage Vitals  Enc Vitals Group     BP 08/17/19 1930 127/89     Pulse Rate 08/17/19 1930 90     Resp 08/17/19 1930 18     Temp 08/17/19 1930 99.9 F (37.7 C)     Temp Source 08/17/19 1930 Oral     SpO2 08/17/19 1930 95 %     Weight --      Height --      Head Circumference --      Peak Flow --      Pain Score 08/17/19 1931 5     Pain Loc --      Pain Edu? --      Excl. in [place]? --    No data found.  Updated Vital Signs  BP 127/89 (BP Location: Left Arm)   Pulse 90   Temp 99.9 F (  37.7 C) (Oral)   Resp 18   SpO2 95%   Physical Exam Constitutional:      General: She is not in acute distress.    Appearance: Normal appearance. She is well-developed. She is not ill-appearing, toxic-appearing or diaphoretic.  HENT:     Head: Normocephalic and atraumatic.     Right Ear: Tympanic membrane, ear canal and external ear normal. Tympanic membrane is not erythematous or bulging.     Left Ear: Tympanic membrane, ear canal and external ear normal. Tympanic membrane is not erythematous or bulging.     Nose:     Right Sinus: Maxillary sinus tenderness present. No frontal sinus tenderness.     Left Sinus: Maxillary sinus tenderness present. No frontal sinus tenderness.     Mouth/Throat:     Mouth: Mucous membranes are moist.     Pharynx: Oropharynx is clear. Uvula midline.  Eyes:     Conjunctiva/sclera: Conjunctivae normal.     Pupils: Pupils are equal, round, and reactive to light.  Cardiovascular:     Rate and Rhythm: Normal rate and regular rhythm.  Pulmonary:     Effort: Pulmonary effort is normal. No accessory muscle usage, prolonged expiration, respiratory distress or retractions.     Breath sounds: No decreased air movement or transmitted upper airway sounds. No decreased breath sounds.     Comments: LCTAB Musculoskeletal:     Cervical back: Normal range of motion and neck supple.  Skin:    General: Skin is warm and dry.  Neurological:     Mental Status: She is alert and oriented to person, place, and time.      UC Treatments / Results  Labs (all labs ordered are listed, but only abnormal results are displayed) Labs Reviewed - No data to display  EKG   Radiology No results found.  Procedures Procedures (including critical care time)  Medications Ordered in UC Medications - No data to display  Initial Impression / Assessment and Plan / UC Course  I have reviewed the triage vital signs  and the nursing notes.  Pertinent labs & imaging results that were available during my care of the patient were reviewed by me and considered in my medical decision making (see chart for details).    Discussed with patient, no signs of bacterial infection. Prednisone and flonase for symptomatic management. Return precautions given.  Final Clinical Impressions(s) / UC Diagnoses   Final diagnoses:  Sinus pressure  Left ear pain    ED Prescriptions    Medication Sig Dispense Auth. Provider   predniSONE (DELTASONE) 50 MG tablet  (Status: Discontinued) Take 1 tablet (50 mg total) by mouth daily with breakfast. 5 tablet Ansley Stanwood V, PA-C   fluticasone (FLONASE) 50 MCG/ACT nasal spray  (Status: Discontinued) Place 2 sprays into both nostrils daily. 1 g Leeanne Butters V, PA-C   fluticasone (FLONASE) 50 MCG/ACT nasal spray Place 2 sprays into both nostrils daily. 1 g Sheril Hammond V, PA-C   predniSONE (DELTASONE) 50 MG tablet Take 1 tablet (50 mg total) by mouth daily with breakfast. 5 tablet Ok Edwards, PA-C     PDMP not reviewed this encounter.   Ok Edwards, PA-C 08/17/19 2213

## 2019-08-17 NOTE — Discharge Instructions (Addendum)
No signs of bacterial infection. Start prednisone for sinus pressure, flonase for congestion. If noticing any rash to the face, please let us know. Follow up with PCP if symptoms not improving. Go to the ED for trouble breathing, swelling of the throat.

## 2019-09-01 ENCOUNTER — Other Ambulatory Visit: Payer: Medicare PPO

## 2019-09-14 ENCOUNTER — Ambulatory Visit
Admission: EM | Admit: 2019-09-14 | Discharge: 2019-09-14 | Disposition: A | Discharge status: Home / Self Care | Payer: Medicare PPO | Attending: Physician Assistant | Admitting: Physician Assistant

## 2019-09-14 ENCOUNTER — Other Ambulatory Visit: Payer: Self-pay

## 2019-09-14 ENCOUNTER — Ambulatory Visit (INDEPENDENT_AMBULATORY_CARE_PROVIDER_SITE_OTHER): Payer: Medicare PPO

## 2019-09-14 DIAGNOSIS — M25559 Pain in unspecified hip: Secondary | ICD-10-CM | POA: Diagnosis present

## 2019-09-14 DIAGNOSIS — N3090 Cystitis, unspecified without hematuria: Secondary | ICD-10-CM | POA: Insufficient documentation

## 2019-09-14 DIAGNOSIS — M25552 Pain in left hip: Secondary | ICD-10-CM

## 2019-09-14 DIAGNOSIS — W010XXA Fall on same level from slipping, tripping and stumbling without subsequent striking against object, initial encounter: Secondary | ICD-10-CM | POA: Diagnosis not present

## 2019-09-14 LAB — POCT URINALYSIS DIP (MANUAL ENTRY)
Bilirubin, UA: NEGATIVE
Glucose, UA: NEGATIVE mg/dL
Ketones, POC UA: NEGATIVE mg/dL
Nitrite, UA: NEGATIVE
Protein Ur, POC: NEGATIVE mg/dL
Spec Grav, UA: 1.01 (ref 1.010–1.025)
Urobilinogen, UA: 0.2 E.U./dL
pH, UA: 5 (ref 5.0–8.0)

## 2019-09-14 MED ORDER — TIZANIDINE HCL 2 MG PO TABS
2.0000 mg | ORAL_TABLET | Freq: Three times a day (TID) | ORAL | 0 refills | Status: DC | PRN
Start: 2019-09-14 — End: 2020-02-17

## 2019-09-14 MED ORDER — CEPHALEXIN 500 MG PO CAPS
500.0000 mg | ORAL_CAPSULE | Freq: Two times a day (BID) | ORAL | 0 refills | Status: DC
Start: 2019-09-14 — End: 2019-10-14

## 2019-09-14 NOTE — Discharge Instructions (Signed)
Hip pain X-ray negative for fracture or dislocation.  Continue Tylenol 1000 mg 3 times a day. Tizanidine as needed, this can make you drowsy, so do not take if you are going to drive, operate heavy machinery, or make important decisions. Ice/heat compresses as needed. This can take up to 3-4 weeks to completely resolve, but you should be feeling better each week. Follow up with PCP/orthopedics if symptoms worsen, changes for reevaluation.   UTI Your urine was positive for an urinary tract infection. Start keflex as directed. Keep hydrated, urine should be clear to pale yellow in color. Monitor for any worsening of symptoms, fever, worsening abdominal pain, nausea/vomiting, flank pain, follow up for reevaluation.

## 2019-09-14 NOTE — ED Provider Notes (Signed)
[name]    CSN: [id number] Arrival date & time: 09/14/19  1546      History   Chief Complaint Chief Complaint  Patient presents with  . Urinary Tract Infection    HPI Kayla [name] is a 74 y.o. female.   74 year old female comes in for multiple complaints.  1. Urinary frequency, dysuria, bladder pressure for the past 3 days. Denies abdominal pain, nausea, vomiting. Denies fevers.   2. [place] 4-5 days ago due to tripping over her dog, causing her to fall forward. Hit her face, right elbow, left hand, bilateral knees. Denies loss of consciousness. Denies headache, nausea/vomiting, changes in vision, dizziness, weakness, syncope. Now with right hip pain, and some generalized body pain to the BLE. Denies saddle anesthesia, loss of bladder or bowel control. Tylenol without relief.      Past Medical History:  Diagnosis Date  . Anemia   . Anxiety   . Asthma   . B12 deficiency   . Bursitis   . Chronic LBP   . Chronic UTI   . Diastolic dysfunction   . Fibromyalgia   . GERD (gastroesophageal reflux disease)   . Hyperlipemia   . Hypertension   . Hypothyroid   . IBS (irritable bowel syndrome)   . Inflammatory osteoarthritis   . Kidney infection   . Psoriatic arthritis ([place])   . S/P cardiac cath 11/30/2007   normal coronaries - Dr. Burt Knack (Dr. Debara Pickett reviewed films on 01/10/2016)    Patient Active Problem List   Diagnosis Date Noted  . Renal insufficiency 12/29/2018  . Cyst of left ovary 12/29/2018  . Esophageal dysmotility 12/29/2018  . 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 Acquired hypothyroidism 12/29/2018  . Chronic maxillary sinusitis 07/07/2018  . Snoring 06/01/2018  . Costochondritis 06/01/2018  . Family history of heart disease 06/01/2018  . Class 2 severe obesity due to excess calories with serious comorbidity and body mass index (BMI) of 38.0 to 38.9 in adult ([place]) 06/01/2018  . Multiple atypical skin moles 04/28/2017  .  Depression, recurrent ([place]) 07/16/2016  . Adjustment disorder with mixed anxiety and depressed mood 01/15/2016  . Fe Def Anemia 12/22/2015  . Scoliosis (and kyphoscoliosis), idiopathic 12/20/2015  . Osteopenia 12/20/2015  . Fibromyalgia 08/02/2014  . GAD (generalized anxiety disorder) 09/17/2012  . Vitamin D deficiency 01/24/2012  . Insomnia 01/24/2012  . Fatigue 10/14/2011  . B12 deficiency   . Hyperlipidemia 11/30/2007  . Coronary artery disease involving native heart without angina pectoris 11/30/2007  . Essential hypertension 01/27/2007  . Reactive airway disease that is not asthma 01/27/2007  . GERD 01/27/2007  . IBS 01/27/2007    Past Surgical History:  Procedure Laterality Date  . CESAREAN SECTION  06/10/1975  . CHOLECYSTECTOMY    . NECK SURGERY  1998  . repair broken C6 & C7      OB History   No obstetric history on file.      Home Medications    Prior to Admission medications   Medication Sig Start Date End Date Taking? Authorizing Provider  aspirin EC 81 MG tablet Take 1 tablet (81 mg total) by mouth daily. 12/15/18   Buford Dresser, MD  Calcium-Magnesium-Zinc (CAL-MAG-ZINC PO) Take by mouth 2 (two) times daily.    [provider]  cephALEXin (KEFLEX) 500 MG capsule Take 1 capsule (500 mg total) by mouth 2 (two) times daily. 09/14/19   Tasia Catchings, Bharat Antillon V, PA-C  chlorthalidone (HYGROTON) 25 MG tablet Take 1  tablet (25 mg total) by mouth daily. 04/14/19 04/08/20  Buford Dresser, MD  cyanocobalamin ([employer] VITAMIN B12) 1000 MCG tablet Take 1 tablet (1,000 mcg total) by mouth daily. 03/02/19   Ladell Pier, MD  diltiazem (TIAZAC) 360 MG 24 hr capsule Take 1 capsule (360 mg total) by mouth daily. 03/09/19   Buford Dresser, MD  ferrous sulfate 325 (65 FE) MG tablet ferrous sulfate 325 mg (65 mg iron) tablet  TAKE 1 TABLET BY MOUTH 2 TIMES DAILY WITH A MEAL    [provider]  fluticasone (FLONASE) 50 MCG/ACT nasal spray Place 2 sprays into  both nostrils daily. 08/17/19   Ok Edwards, PA-C  levothyroxine (SYNTHROID) 112 MCG tablet Take 1 tablet (112 mcg total) by mouth daily. 07/12/19   Nicolette Bang, DO  Omega-3 Fatty Acids (FISH OIL) 1000 MG CAPS Take by mouth. 01/15/16   [provider]  POTASSIUM CHLORIDE PO Take by mouth.    [provider]  pyridOXINE (VITAMIN B-6) 100 MG tablet Take 100 mg by mouth daily.    [provider]  tiZANidine (ZANAFLEX) 2 MG tablet Take 1 tablet (2 mg total) by mouth every 8 (eight) hours as needed for muscle spasms. 09/14/19   Tasia Catchings, Natanel Snavely V, PA-C  triamcinolone ointment (KENALOG) 0.5 % Apply 1 application topically 2 (two) times daily. 08/10/19   Hall-Potvin, [place], PA-C    Family History Family History  Problem Relation Age of Onset  . Heart attack Father 36  . Lung cancer Other        uncle, non smoker  . Leukemia Other        uncle  . Cancer Mother        oral  . Stroke Mother   . Alzheimer's disease Mother        at 17  . Cancer Maternal Aunt        breast  . Alcohol abuse Maternal Aunt   . Alcohol abuse Maternal Uncle   . Cancer Maternal Uncle        lung, stomach, oral  . Cancer Paternal Uncle        lung  . Congestive Heart Failure Maternal Grandfather   . Heart disease Maternal Grandfather   . Heart attack Paternal Grandfather   . Cancer Paternal Uncle        bone  . Brain cancer Maternal Grandmother   . Alzheimer's disease Other        maternal great grandmother   . Colon cancer Neg Hx   . Breast cancer Neg Hx   . Diabetes Neg Hx     Social History Social History   Tobacco Use  . Smoking status: Never Smoker  . Smokeless tobacco: Never Used  Substance Use Topics  . Alcohol use: No  . Drug use: No     Allergies   Ace inhibitors and Lisinopril   Review of Systems Review of Systems  Reason unable to perform ROS: See HPI as above.     Physical Exam Triage Vital Signs ED Triage Vitals [09/14/19 1605]  Enc Vitals Group       BP (!) 147/96     Pulse Rate 93     Resp 20     Temp 98.6 F (37 C)     Temp Source Oral     SpO2 96 %     Weight      Height      Head Circumference  Peak Flow      Pain Score 6     Pain Loc      Pain Edu?      Excl. in [place]?    No data found.  Updated Vital Signs BP (!) 147/96 (BP Location: Left Arm)   Pulse 93   Temp 98.6 F (37 C) (Oral)   Resp 20   SpO2 96%   Physical Exam Constitutional:      General: She is not in acute distress.    Appearance: She is well-developed. She is not ill-appearing, toxic-appearing or diaphoretic.  HENT:     Head: Normocephalic and atraumatic.  Eyes:     Conjunctiva/sclera: Conjunctivae normal.     Pupils: Pupils are equal, round, and reactive to light.  Cardiovascular:     Rate and Rhythm: Normal rate and regular rhythm.  Pulmonary:     Effort: Pulmonary effort is normal. No respiratory distress.     Comments: LCTAB Abdominal:     General: Bowel sounds are normal.     Palpations: Abdomen is soft.     Tenderness: There is no abdominal tenderness. There is no right CVA tenderness, left CVA tenderness, guarding or rebound.  Musculoskeletal:     Cervical back: Normal range of motion and neck supple.     Comments: No tenderness to palpation of spinous processes.  Tenderness to palpation of right lumbar/sacral region, diffuse tenderness to palpation of the right hip.  Decreased flexion and extension of the back.  Full passive range of motion of the hips.  Strength deferred.  Sensation intact.  Negative straight leg raise.  Skin:    General: Skin is warm and dry.  Neurological:     Mental Status: She is alert and oriented to person, place, and time.  Psychiatric:        Behavior: Behavior normal.        Judgment: Judgment normal.      UC Treatments / Results  Labs (all labs ordered are listed, but only abnormal results are displayed) Labs Reviewed  POCT URINALYSIS DIP (MANUAL ENTRY) - Abnormal; Notable for the following  components:      Result Value   Blood, UA trace-intact (*)    Leukocytes, UA Small (1+) (*)    All other components within normal limits  URINE CULTURE    EKG   Radiology DG Hip Unilat With Pelvis 2-3 Views Right  Result Date: 09/14/2019 CLINICAL DATA:  Right hip pain after fall for 4 days. EXAM: DG HIP (WITH OR WITHOUT PELVIS) 2-3V RIGHT COMPARISON:  None. FINDINGS: The cortical margins of the bony pelvis and right hip are intact. No fracture. Pubic symphysis and sacroiliac joints are congruent. Both femoral heads are well-seated in the respective acetabula with mild bilateral hip degenerative change. No focal bone lesion or evidence of avascular necrosis. IMPRESSION: No acute fracture or dislocation of the pelvis or right hip. Minor hip osteoarthritis. Electronically Signed   By: Keith Rake M.D.   On: 09/14/2019 17:21    Procedures Procedures (including critical care time)  Medications Ordered in UC Medications - No data to display  Initial Impression / Assessment and Plan / UC Course  I have reviewed the triage vital signs and the nursing notes.  Pertinent labs & imaging results that were available during my care of the patient were reviewed by me and considered in my medical decision making (see chart for details).    1. Hip pain Xray negative for fractures/dislocation. Will treat symptomatically.  Expected course of healing discussed. Return precautions given.  2. Cystitis Urine dipstick positive for UTI. Start antibiotics as directed. Push fluids. Return precautions given.  Final Clinical Impressions(s) / UC Diagnoses   Final diagnoses:  Hip pain  Cystitis   ED Prescriptions    Medication Sig Dispense Auth. Provider   cephALEXin (KEFLEX) 500 MG capsule Take 1 capsule (500 mg total) by mouth 2 (two) times daily. 10 capsule Tasia Catchings, [place] Du[place] V, PA-C   tiZANidine (ZANAFLEX) 2 MG tablet Take 1 tablet (2 mg total) by mouth every 8 (eight) hours as needed for muscle spasms. 15  tablet Ok Edwards, PA-C     I have reviewed the PDMP during this encounter.   Ok Edwards, PA-C 09/14/19 2154

## 2019-09-14 NOTE — ED Triage Notes (Signed)
Pt c/o urinary burning and pressure since Sunday. Pt states tripped over her dog and fell on wood floor. States hit her face on the bottom wood of her cough. Pt c/o rt hip pain. Denies LOC

## 2019-09-16 LAB — URINE CULTURE

## 2019-09-18 ENCOUNTER — Other Ambulatory Visit: Payer: Self-pay | Admitting: Adult Health

## 2019-10-07 ENCOUNTER — Telehealth: Payer: Self-pay | Admitting: Internal Medicine

## 2019-10-07 NOTE — Telephone Encounter (Signed)
Pt wants a CMP done

## 2019-10-08 ENCOUNTER — Other Ambulatory Visit: Payer: Self-pay | Admitting: Internal Medicine

## 2019-10-08 DIAGNOSIS — I10 Essential (primary) hypertension: Secondary | ICD-10-CM

## 2019-10-08 DIAGNOSIS — E039 Hypothyroidism, unspecified: Secondary | ICD-10-CM

## 2019-10-08 NOTE — Telephone Encounter (Signed)
It looks like she never returned for repeat thyroid studies anyways when we adjusted her medications at end of March. I have ordered labs. Please schedule her for lab visit.   Phill Myron, D.O. Primary Care at [facility]  10/08/2019, 11:26 AM

## 2019-10-08 NOTE — Telephone Encounter (Signed)
We can do this but need to know why we are doing CMP.   Thanks,  Phill Myron, D.O. Primary Care at [facility]  10/08/2019, 10:23 AM

## 2019-10-08 NOTE — Telephone Encounter (Signed)
Called pt back she doesn't think her thyroid meds are working and wants you to check everything

## 2019-10-08 NOTE — Telephone Encounter (Signed)
Called pt she doesn't think her thyroid meds are working

## 2019-10-13 ENCOUNTER — Other Ambulatory Visit: Payer: Medicare PPO

## 2019-10-14 ENCOUNTER — Telehealth (INDEPENDENT_AMBULATORY_CARE_PROVIDER_SITE_OTHER): Payer: Medicare PPO | Admitting: Internal Medicine

## 2019-10-14 ENCOUNTER — Encounter: Payer: Self-pay | Admitting: Internal Medicine

## 2019-10-14 ENCOUNTER — Other Ambulatory Visit: Payer: Medicare PPO

## 2019-10-14 ENCOUNTER — Other Ambulatory Visit: Payer: Self-pay | Admitting: Internal Medicine

## 2019-10-14 DIAGNOSIS — R9389 Abnormal findings on diagnostic imaging of other specified body structures: Secondary | ICD-10-CM | POA: Diagnosis not present

## 2019-10-14 DIAGNOSIS — N83202 Unspecified ovarian cyst, left side: Secondary | ICD-10-CM

## 2019-10-14 DIAGNOSIS — R399 Unspecified symptoms and signs involving the genitourinary system: Secondary | ICD-10-CM

## 2019-10-14 DIAGNOSIS — N939 Abnormal uterine and vaginal bleeding, unspecified: Secondary | ICD-10-CM | POA: Diagnosis not present

## 2019-10-14 LAB — POCT URINALYSIS DIP (CLINITEK)
Bilirubin, UA: NEGATIVE
Glucose, UA: NEGATIVE mg/dL
Ketones, POC UA: NEGATIVE mg/dL
Nitrite, UA: NEGATIVE
POC PROTEIN,UA: NEGATIVE
Spec Grav, UA: 1.015 (ref 1.010–1.025)
Urobilinogen, UA: 0.2 E.U./dL
pH, UA: 5.5 (ref 5.0–8.0)

## 2019-10-14 MED ORDER — CEPHALEXIN 500 MG PO CAPS: 500.0000 mg | ORAL_CAPSULE | Freq: Four times a day (QID) | ORAL | 0 refills | Status: AC

## 2019-10-14 NOTE — Progress Notes (Signed)
Virtual Visit via Telephone Note  I connected with Kayla [name], on 10/14/2019 at 2:32 PM by telephone due to the COVID-19 pandemic and verified that I am speaking with the correct person using two identifiers.   Consent: I discussed the limitations, risks, security and privacy concerns of performing an evaluation and management service by telephone and the availability of in person appointments. I also discussed with the patient that there may be a patient responsible charge related to this service. The patient expressed understanding and agreed to proceed.   Location of Patient: Home   Location of Provider: Clinic    Persons participating in Telemedicine visit: Ayani Ospina [facility] Dr. Juleen China    History of Present Illness: Patient has concerns for UTI. Reports she has had several days of dysuria and pelvic pressure. Thought it wasn't a UTI at first as urine has stayed clear. She is concerned because she ended up in sepsis from pyelonephritis in July 2020.   Wants to know if she should follow up with OB. She has some spotting on her panty liner that has occurred in the past and now returned. Believes that it is coming from her vagina and not from her urethra. No gross hematuria in the toilet bowl.    Past Medical History:  Diagnosis Date  . Anemia   . Anxiety   . Asthma   . B12 deficiency   . Bursitis   . Chronic LBP   . Chronic UTI   . Diastolic dysfunction   . Fibromyalgia   . GERD (gastroesophageal reflux disease)   . Hyperlipemia   . Hypertension   . Hypothyroid   . IBS (irritable bowel syndrome)   . Inflammatory osteoarthritis   . Kidney infection   . Psoriatic arthritis ([place])   . S/P cardiac cath 11/30/2007   normal coronaries - Dr. Burt Knack (Dr. Debara Pickett reviewed films on 01/10/2016)   Allergies  Allergen Reactions  . Ace Inhibitors Anaphylaxis    REACTION: glossal edema  . Lisinopril Anaphylaxis    Current Outpatient Medications on File Prior to Visit   Medication Sig Dispense Refill  . aspirin EC 81 MG tablet Take 1 tablet (81 mg total) by mouth daily.    . Calcium-Magnesium-Zinc (CAL-MAG-ZINC PO) Take by mouth 2 (two) times daily.    . chlorthalidone (HYGROTON) 25 MG tablet Take 1 tablet (25 mg total) by mouth daily. 30 tablet 11  . cyanocobalamin ([employer] VITAMIN B12) 1000 MCG tablet Take 1 tablet (1,000 mcg total) by mouth daily. 90 tablet 1  . diltiazem (TIAZAC) 360 MG 24 hr capsule Take 1 capsule (360 mg total) by mouth daily. 90 capsule 1  . ferrous sulfate 325 (65 FE) MG tablet ferrous sulfate 325 mg (65 mg iron) tablet  TAKE 1 TABLET BY MOUTH 2 TIMES DAILY WITH A MEAL    . fluticasone (FLONASE) 50 MCG/ACT nasal spray Place 2 sprays into both nostrils daily. 1 g 0  . levothyroxine (SYNTHROID) 112 MCG tablet Take 1 tablet (112 mcg total) by mouth daily. 90 tablet 3  . Omega-3 Fatty Acids (FISH OIL) 1000 MG CAPS Take by mouth.    Marland Kitchen POTASSIUM CHLORIDE PO Take by mouth.    . pyridOXINE (VITAMIN B-6) 100 MG tablet Take 100 mg by mouth daily.    Marland Kitchen tiZANidine (ZANAFLEX) 2 MG tablet Take 1 tablet (2 mg total) by mouth every 8 (eight) hours as needed for muscle spasms. 15 tablet 0  . triamcinolone ointment (KENALOG) 0.5 %  Apply 1 application topically 2 (two) times daily. 30 g 0   No current facility-administered medications on file prior to visit.    Observations/Objective: NAD. Speaking clearly.  Work of breathing normal.  Alert and oriented. Mood appropriate.   Assessment and Plan: 1. UTI symptoms Will obtain urine studies. Patient with history of urosepsis about one year ago. However, since then has had recurrence of UTI like symptoms with multiple negative urine cultures. If shows concern for UTI, will treat.  - POCT URINALYSIS DIP (CLINITEK); Future - Urine Culture; Future  2. Abnormal uterine bleeding (AUB) 3. Thickened endometrium At last visit in March 2021, had mentioned concern for AUB and need to reschedule GYN visit. Her  urine studies were negative for blood and I had recommended prompt rescheduling of her appointment. She now reports bleeding has returned and requests referral. This was placed. Reviewed that she had ultrasound from Oct 2020 with thickened endometrium. Emphasized that AUB after menopause is concerning for malignancy until proven otherwise.  - Ambulatory referral to Obstetrics / Gynecology  4. Left ovarian cyst Seen on ultrasound Oct 2020. Appeared benign but follow up ultrasound was recommended. Will defer to GYN for repeat.    Follow Up Instructions: GYN referral, routine medical care with PCP    I discussed the assessment and treatment plan with the patient. The patient was provided an opportunity to ask questions and all were answered. The patient agreed with the plan and demonstrated an understanding of the instructions.   The patient was advised to call back or seek an in-person evaluation if the symptoms worsen or if the condition fails to improve as anticipated.     I provided 22 minutes total of non-face-to-face time during this encounter including median intraservice time, reviewing previous notes, investigations, ordering medications, medical decision making, coordinating care and patient verbalized understanding at the end of the visit.    Phill Myron, D.O. Primary Care at [facility]  10/14/2019, 2:32 PM

## 2019-10-14 NOTE — Addendum Note (Signed)
Addended by: Carylon Perches on: 10/14/2019 04:44 PM   Modules accepted: Orders

## 2019-10-14 NOTE — Addendum Note (Signed)
Addended by: Carylon Perches on: 10/14/2019 05:00 PM   Modules accepted: Orders

## 2019-10-15 NOTE — Progress Notes (Signed)
Patient notified of results & recommendations. Expressed understanding.

## 2019-10-16 LAB — URINE CULTURE

## 2019-10-18 ENCOUNTER — Other Ambulatory Visit: Payer: Self-pay | Admitting: Internal Medicine

## 2019-10-18 DIAGNOSIS — R3982 Chronic bladder pain: Secondary | ICD-10-CM

## 2019-10-18 NOTE — Progress Notes (Signed)
Patient notified of results & recommendations. Expressed understanding.

## 2019-10-26 ENCOUNTER — Telehealth: Payer: Self-pay

## 2019-10-26 ENCOUNTER — Other Ambulatory Visit: Payer: Self-pay

## 2019-10-26 ENCOUNTER — Other Ambulatory Visit (INDEPENDENT_AMBULATORY_CARE_PROVIDER_SITE_OTHER): Payer: Medicare PPO

## 2019-10-26 ENCOUNTER — Other Ambulatory Visit: Payer: Self-pay | Admitting: Internal Medicine

## 2019-10-26 DIAGNOSIS — E039 Hypothyroidism, unspecified: Secondary | ICD-10-CM

## 2019-10-26 DIAGNOSIS — I10 Essential (primary) hypertension: Secondary | ICD-10-CM

## 2019-10-26 DIAGNOSIS — E876 Hypokalemia: Secondary | ICD-10-CM | POA: Diagnosis not present

## 2019-10-26 MED ORDER — FLUCONAZOLE 150 MG PO TABS: ORAL_TABLET | ORAL | 0 refills | Status: DC

## 2019-10-26 MED ORDER — FLUCONAZOLE 150 MG PO TABS
ORAL_TABLET | ORAL | 0 refills | Status: DC
Start: 2019-10-26 — End: 2019-10-26

## 2019-10-26 NOTE — Telephone Encounter (Signed)
Patient was in office for repeat labs. She states that the Keflex that was started due to a potential UTI caused her to have a yeast infection. She wants to know if Diflucan can be called in to her pharmacy.

## 2019-10-26 NOTE — Telephone Encounter (Signed)
Patient notified that prescription was sent. 

## 2019-10-26 NOTE — Telephone Encounter (Signed)
Rx for Diflucan was sent.   Phill Myron, D.O. Primary Care at [facility]  10/26/2019, 4:41 PM

## 2019-10-27 ENCOUNTER — Encounter: Payer: Self-pay | Admitting: Urology

## 2019-10-27 LAB — BASIC METABOLIC PANEL
BUN/Creatinine Ratio: 9 — ABNORMAL LOW (ref 12–28)
BUN: 10 mg/dL (ref 8–27)
CO2: 24 mmol/L (ref 20–29)
Calcium: 9.8 mg/dL (ref 8.7–10.3)
Chloride: 106 mmol/L (ref 96–106)
Creatinine, Ser: 1.08 mg/dL — ABNORMAL HIGH (ref 0.57–1.00)
GFR calc Af Amer: 58 mL/min/{1.73_m2} — ABNORMAL LOW (ref >59)
GFR calc non Af Amer: 51 mL/min/{1.73_m2} — ABNORMAL LOW (ref >59)
Glucose: 95 mg/dL (ref 65–99)
Potassium: 3.6 mmol/L (ref 3.5–5.2)
Sodium: 145 mmol/L — ABNORMAL HIGH (ref 134–144)

## 2019-10-27 LAB — TSH+T4F+T3FREE
Free T4: 1.28 ng/dL (ref 0.82–1.77)
T3, Free: 2.8 pg/mL (ref 2.0–4.4)
TSH: 3.2 u[IU]/mL (ref 0.450–4.500)

## 2019-11-10 ENCOUNTER — Ambulatory Visit (INDEPENDENT_AMBULATORY_CARE_PROVIDER_SITE_OTHER): Payer: Medicare PPO | Admitting: Internal Medicine

## 2019-11-10 ENCOUNTER — Other Ambulatory Visit: Payer: Self-pay

## 2019-11-10 ENCOUNTER — Encounter: Payer: Self-pay | Admitting: Internal Medicine

## 2019-11-10 VITALS — BP 120/77 | HR 76 | Temp 97.5°F | Resp 17 | Wt 215.0 lb

## 2019-11-10 DIAGNOSIS — R5383 Other fatigue: Secondary | ICD-10-CM | POA: Diagnosis not present

## 2019-11-10 DIAGNOSIS — R2231 Localized swelling, mass and lump, right upper limb: Secondary | ICD-10-CM | POA: Diagnosis not present

## 2019-11-10 DIAGNOSIS — M25559 Pain in unspecified hip: Secondary | ICD-10-CM | POA: Diagnosis not present

## 2019-11-10 DIAGNOSIS — Z13228 Encounter for screening for other metabolic disorders: Secondary | ICD-10-CM | POA: Diagnosis not present

## 2019-11-10 DIAGNOSIS — R3982 Chronic bladder pain: Secondary | ICD-10-CM

## 2019-11-10 MED ORDER — PREDNISONE 20 MG PO TABS: 20.0000 mg | ORAL_TABLET | Freq: Every day | ORAL | 0 refills | Status: DC

## 2019-11-10 MED ORDER — PREDNISONE 20 MG PO TABS
20.0000 mg | ORAL_TABLET | Freq: Every day | ORAL | 0 refills | Status: DC
Start: 2019-11-10 — End: 2020-02-17

## 2019-11-10 NOTE — Progress Notes (Signed)
Subjective:    Kayla [name] - 74 y.o. female MRN [id number]  Date of birth: 1945-11-22  HPI  Kayla [name] is here for acute concerns. Feels very weak and fatigued. She thinks some of this is due to the fact that her OA is so severe that she never is able to get into a deep sleep pattern.   Her hip pain has gotten worse. She would like steroid to treat this pain. Her mobility is limited.   Health Maintenance:  Health Maintenance Due  Topic Date Due  . COVID-19 Vaccine (1) Never done  . MAMMOGRAM  04/08/2006  . DEXA SCAN  Never done  . COLONOSCOPY  05/19/2012  . TETANUS/TDAP  04/08/2013  . INFLUENZA VACCINE  11/07/2019    -  reports that she has never smoked. She has never used smokeless tobacco. - Review of Systems: Per HPI. - Past Medical History: Patient Active Problem List   Diagnosis Date Noted  . Renal insufficiency 12/29/2018  . Cyst of left ovary 12/29/2018  . Esophageal dysmotility 12/29/2018  . 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 Acquired hypothyroidism 12/29/2018  . Chronic maxillary sinusitis 07/07/2018  . Snoring 06/01/2018  . Costochondritis 06/01/2018  . Family history of heart disease 06/01/2018  . Class 2 severe obesity due to excess calories with serious comorbidity and body mass index (BMI) of 38.0 to 38.9 in adult ([place]) 06/01/2018  . Multiple atypical skin moles 04/28/2017  . Depression, recurrent ([place]) 07/16/2016  . Adjustment disorder with mixed anxiety and depressed mood 01/15/2016  . Fe Def Anemia 12/22/2015  . Scoliosis (and kyphoscoliosis), idiopathic 12/20/2015  . Osteopenia 12/20/2015  . Fibromyalgia 08/02/2014  . GAD (generalized anxiety disorder) 09/17/2012  . Vitamin D deficiency 01/24/2012  . Insomnia 01/24/2012  . Fatigue 10/14/2011  . B12 deficiency   . Hyperlipidemia 11/30/2007  . Coronary artery disease involving native heart without angina pectoris 11/30/2007  . Essential hypertension 01/27/2007  . Reactive  airway disease that is not asthma 01/27/2007  . GERD 01/27/2007  . IBS 01/27/2007   - Medications: reviewed and updated   Objective:   Physical Exam BP 120/77   Pulse 76   Temp (!) 97.5 F (36.4 C) (Temporal)   Resp 17   Wt 215 lb (97.5 kg)   SpO2 96%   BMI 36.90 kg/m  Physical Exam Constitutional:      General: She is not in acute distress.    Appearance: She is not diaphoretic.  HENT:     Head: Normocephalic and atraumatic.  Eyes:     Conjunctiva/sclera: Conjunctivae normal.  Cardiovascular:     Rate and Rhythm: Normal rate and regular rhythm.     Heart sounds: Normal heart sounds. No murmur heard.   Pulmonary:     Effort: Pulmonary effort is normal. No respiratory distress.     Breath sounds: Normal breath sounds.  Musculoskeletal:        General: Normal range of motion.  Lymphadenopathy:     Upper Body:     Right upper body: Axillary adenopathy present.  Skin:    General: Skin is warm and dry.  Neurological:     Mental Status: She is alert and oriented to person, place, and time.  Psychiatric:        Mood and Affect: Affect normal.        Judgment: Judgment normal.            Assessment & Plan:   1. Screening for  metabolic disorder - Hepatic Function Panel  2. Mass of right axilla Will refer for imaging at breast cancer center.   3. Fatigue, unspecified type - CBC - Vitamin B12 - VITAMIN D 25 Hydroxy (Vit-D Deficiency, Fractures) - Folate  4. Hip pain - predniSONE (DELTASONE) 20 MG tablet; Take 1 tablet (20 mg total) by mouth daily with breakfast.  Dispense: 5 tablet; Refill: 0  5. Chronic urinary bladder pain Has had several complaints of urinary symptoms with negative urine cultures. Encouraged adequate hydration and avoidance of possible dietary irritants. Refer to urology for further work up.  - Ambulatory referral to Urology   Phill Myron, D.O. 11/10/2019, 3:45 PM Primary Care at [facility]

## 2019-11-11 ENCOUNTER — Other Ambulatory Visit: Payer: Self-pay | Admitting: Internal Medicine

## 2019-11-11 DIAGNOSIS — R2231 Localized swelling, mass and lump, right upper limb: Secondary | ICD-10-CM

## 2019-11-11 LAB — CBC
Hematocrit: 42.3 % (ref 34.0–46.6)
Hemoglobin: 14.4 g/dL (ref 11.1–15.9)
MCH: 32.5 pg (ref 26.6–33.0)
MCHC: 34 g/dL (ref 31.5–35.7)
MCV: 96 fL (ref 79–97)
Platelets: 339 10*3/uL (ref 150–450)
RBC: 4.43 x10E6/uL (ref 3.77–5.28)
RDW: 12.8 % (ref 11.7–15.4)
WBC: 9.9 10*3/uL (ref 3.4–10.8)

## 2019-11-11 LAB — VITAMIN B12: Vitamin B-12: 618 pg/mL (ref 232–1245)

## 2019-11-11 LAB — HEPATIC FUNCTION PANEL
ALT: 20 IU/L (ref 0–32)
AST: 21 IU/L (ref 0–40)
Albumin: 4.1 g/dL (ref 3.7–4.7)
Alkaline Phosphatase: 79 IU/L (ref 48–121)
Bilirubin Total: 0.3 mg/dL (ref 0.0–1.2)
Bilirubin, Direct: 0.11 mg/dL (ref 0.00–0.40)
Total Protein: 6.7 g/dL (ref 6.0–8.5)

## 2019-11-11 LAB — VITAMIN D 25 HYDROXY (VIT D DEFICIENCY, FRACTURES): Vit D, 25-Hydroxy: 29.1 ng/mL — ABNORMAL LOW (ref 30.0–100.0)

## 2019-11-11 LAB — FOLATE: Folate: 12.7 ng/mL (ref >3.0)

## 2019-11-26 ENCOUNTER — Other Ambulatory Visit: Payer: Medicare PPO

## 2020-02-14 ENCOUNTER — Telehealth: Payer: Self-pay

## 2020-02-15 NOTE — Telephone Encounter (Signed)
Contacted by phone to schedule upcoming procedure and requested patient to contact the office if the appointment time shared needs to be rescheduled.

## 2020-02-17 ENCOUNTER — Ambulatory Visit (INDEPENDENT_AMBULATORY_CARE_PROVIDER_SITE_OTHER): Payer: Medicare PPO | Admitting: Internal Medicine

## 2020-02-17 ENCOUNTER — Encounter: Payer: Self-pay | Admitting: Internal Medicine

## 2020-02-17 ENCOUNTER — Other Ambulatory Visit: Payer: Self-pay

## 2020-02-17 VITALS — BP 131/85 | HR 81 | Temp 97.3°F | Resp 17 | Ht 65.0 in | Wt 213.0 lb

## 2020-02-17 DIAGNOSIS — N939 Abnormal uterine and vaginal bleeding, unspecified: Secondary | ICD-10-CM

## 2020-02-17 DIAGNOSIS — Z01818 Encounter for other preprocedural examination: Secondary | ICD-10-CM | POA: Diagnosis not present

## 2020-02-17 DIAGNOSIS — L304 Erythema intertrigo: Secondary | ICD-10-CM | POA: Diagnosis not present

## 2020-02-17 MED ORDER — NYSTATIN 100000 UNIT/GM EX POWD: 1.0000 "application " | Freq: Three times a day (TID) | CUTANEOUS | 0 refills | Status: DC

## 2020-02-17 MED ORDER — KETOCONAZOLE 2 % EX CREA: 1.0000 "application " | TOPICAL_CREAM | Freq: Every day | CUTANEOUS | 0 refills | Status: DC

## 2020-02-17 NOTE — Progress Notes (Signed)
Subjective:    Kayla [name] - 73 y.o. female MRN [id number]  Date of birth: 17-Jul-1945  HPI  Kayla [name] is here for surgical clearance. Patient is scheduled to have hysteroscopy D&D myosure for AUB---not yet scheduled. Patient has had surgery before. No prior issues with anesthesia. Patient has a PMH of HTN, CAD, CKD, HLD, obesity. For her CAD, she is followed by cardiology. She apparently has a remote history of abnormal stress test and nonobstructive CAD on cardiac cath in 2009. No PMH of liver disease, MI, CVA. Patient is not a smoker. No family history of premature death. Denies current chest pain, SOB, severe headaches.    Rash present underneath her breasts for several weeks to months. Very itchy and inflamed. Has not tried anything at home for relief.      Health Maintenance:  Health Maintenance Due  Topic Date Due  . COVID-19 Vaccine (1) Never done  . MAMMOGRAM  04/08/2006  . DEXA SCAN  Never done  . PNA vac Low Risk Adult (1 of 2 - PCV13) 05/23/2010  . COLONOSCOPY  05/19/2012  . TETANUS/TDAP  04/08/2013  . INFLUENZA VACCINE  Never done    -  reports that she has never smoked. She has never used smokeless tobacco. - Review of Systems: Per HPI. - Past Medical History: Patient Active Problem List   Diagnosis Date Noted  . Renal insufficiency 12/29/2018  . Cyst of left ovary 12/29/2018  . Esophageal dysmotility 12/29/2018  . 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 Acquired hypothyroidism 12/29/2018  . Chronic maxillary sinusitis 07/07/2018  . Snoring 06/01/2018  . Costochondritis 06/01/2018  . Family history of heart disease 06/01/2018  . Class 2 severe obesity due to excess calories with serious comorbidity and body mass index (BMI) of 38.0 to 38.9 in adult ([place]) 06/01/2018  . Multiple atypical skin moles 04/28/2017  . Depression, recurrent ([place]) 07/16/2016  . Adjustment disorder with mixed anxiety and depressed mood 01/15/2016  . Fe Def Anemia  12/22/2015  . Scoliosis (and kyphoscoliosis), idiopathic 12/20/2015  . Osteopenia 12/20/2015  . Fibromyalgia 08/02/2014  . GAD (generalized anxiety disorder) 09/17/2012  . Vitamin D deficiency 01/24/2012  . B12 deficiency   . Hyperlipidemia 11/30/2007  . Coronary artery disease involving native heart without angina pectoris 11/30/2007  . Essential hypertension 01/27/2007  . Reactive airway disease that is not asthma 01/27/2007  . GERD 01/27/2007  . IBS 01/27/2007   - Medications: reviewed and updated   Objective:   Physical Exam BP 131/85   Pulse 81   Temp (!) 97.3 F (36.3 C) (Temporal)   Resp 17   Ht 5\' 5"  (1.651 m)   Wt 213 lb (96.6 kg)   SpO2 95%   BMI 35.45 kg/m  Physical Exam Constitutional:      General: She is not in acute distress.    Appearance: She is not diaphoretic.  HENT:     Head: Normocephalic and atraumatic.  Eyes:     Conjunctiva/sclera: Conjunctivae normal.  Cardiovascular:     Rate and Rhythm: Normal rate and regular rhythm.     Heart sounds: Normal heart sounds. No murmur heard.   Pulmonary:     Effort: Pulmonary effort is normal. No respiratory distress.     Breath sounds: Normal breath sounds.  Musculoskeletal:        General: Normal range of motion.  Skin:    General: Skin is warm and dry.     Coloration: Pallor: .diagmed.  Comments: Beefy red plaques with satellite lesions present underneath breasts bilaterally.   Neurological:     Mental Status: She is alert and oriented to person, place, and time.  Psychiatric:        Mood and Affect: Affect normal.        Judgment: Judgment normal.            Assessment & Plan:   1. Pre-operative clearance Discussed with patient that given remote history of abnormal stress test and nonobstructive CAD on cardiac cath in 2009, would recommend cardiology clear patient for surgery. She is already established with cardiologist and will follow up. Monitor renal function due to history of  impairment in past.  - Basic Metabolic Panel  2. Abnormal uterine bleeding (AUB) Followed by GYN.   3. Intertrigo Appears consistent with yeast infection. Will prescribe Ketoconazole and Nystatin powder.Discussed wearing cotton, loose fitting clothing. Keep area as dry as possible.  - nystatin (MYCOSTATIN/NYSTOP) powder; Apply 1 application topically 3 (three) times daily.  Dispense: 60 g; Refill: 0     Phill Myron, D.O. 02/17/2020, 3:02 PM Primary Care at [facility]

## 2020-02-18 ENCOUNTER — Other Ambulatory Visit: Payer: Medicare PPO

## 2020-02-18 LAB — BASIC METABOLIC PANEL
BUN/Creatinine Ratio: 12 (ref 12–28)
BUN: 12 mg/dL (ref 8–27)
CO2: 26 mmol/L (ref 20–29)
Calcium: 9.5 mg/dL (ref 8.7–10.3)
Chloride: 105 mmol/L (ref 96–106)
Creatinine, Ser: 0.98 mg/dL (ref 0.57–1.00)
GFR calc Af Amer: 66 mL/min/{1.73_m2} (ref >59)
GFR calc non Af Amer: 57 mL/min/{1.73_m2} — ABNORMAL LOW (ref >59)
Glucose: 105 mg/dL — ABNORMAL HIGH (ref 65–99)
Potassium: 3.5 mmol/L (ref 3.5–5.2)
Sodium: 143 mmol/L (ref 134–144)

## 2020-02-28 ENCOUNTER — Telehealth: Payer: Self-pay

## 2020-02-28 NOTE — Telephone Encounter (Signed)
The cream given for rash is not working and and she needs a new medication sent

## 2020-02-29 ENCOUNTER — Other Ambulatory Visit: Payer: Self-pay | Admitting: Internal Medicine

## 2020-02-29 MED ORDER — CLOTRIMAZOLE 1 % EX CREA: 1.0000 "application " | TOPICAL_CREAM | Freq: Two times a day (BID) | CUTANEOUS | 0 refills | Status: DC

## 2020-02-29 NOTE — Telephone Encounter (Signed)
Called pt lvm .

## 2020-02-29 NOTE — Telephone Encounter (Signed)
Stop Ketoconazole and start Clotrimazole.  Phill Myron, D.O. Primary Care at [facility]  02/29/2020, 8:51 AM

## 2020-03-01 MED ORDER — TIZANIDINE HCL 2 MG PO TABS: 2.0000 mg | ORAL_TABLET | Freq: Three times a day (TID) | ORAL | 0 refills | Status: DC | PRN

## 2020-03-16 ENCOUNTER — Ambulatory Visit
Admission: EM | Admit: 2020-03-16 | Discharge: 2020-03-16 | Disposition: A | Discharge status: Home / Self Care | Payer: Medicare PPO | Attending: Emergency Medicine | Admitting: Emergency Medicine

## 2020-03-16 ENCOUNTER — Encounter: Payer: Self-pay | Admitting: Emergency Medicine

## 2020-03-16 DIAGNOSIS — Z1152 Encounter for screening for COVID-19: Secondary | ICD-10-CM

## 2020-03-16 DIAGNOSIS — G8929 Other chronic pain: Secondary | ICD-10-CM | POA: Diagnosis not present

## 2020-03-16 DIAGNOSIS — Z79899 Other long term (current) drug therapy: Secondary | ICD-10-CM | POA: Diagnosis not present

## 2020-03-16 DIAGNOSIS — L309 Dermatitis, unspecified: Secondary | ICD-10-CM

## 2020-03-16 DIAGNOSIS — Z888 Allergy status to other drugs, medicaments and biological substances status: Secondary | ICD-10-CM | POA: Insufficient documentation

## 2020-03-16 DIAGNOSIS — M545 Low back pain, unspecified: Secondary | ICD-10-CM | POA: Diagnosis present

## 2020-03-16 DIAGNOSIS — M5441 Lumbago with sciatica, right side: Secondary | ICD-10-CM

## 2020-03-16 DIAGNOSIS — Z20822 Contact with and (suspected) exposure to covid-19: Secondary | ICD-10-CM | POA: Insufficient documentation

## 2020-03-16 LAB — POCT URINALYSIS DIP (MANUAL ENTRY)
Bilirubin, UA: NEGATIVE
Glucose, UA: NEGATIVE mg/dL
Ketones, POC UA: NEGATIVE mg/dL
Nitrite, UA: NEGATIVE
Protein Ur, POC: NEGATIVE mg/dL
Spec Grav, UA: 1.02 (ref 1.010–1.025)
Urobilinogen, UA: 0.2 E.U./dL
pH, UA: 5.5 (ref 5.0–8.0)

## 2020-03-16 MED ORDER — MICONAZOLE NITRATE 2 % EX POWD: CUTANEOUS | 0 refills | Status: DC | PRN

## 2020-03-16 MED ORDER — METHYLPREDNISOLONE SODIUM SUCC 125 MG IJ SOLR
80.0000 mg | Freq: Once | INTRAMUSCULAR | Status: AC
  Administered 2020-03-16: 80 mg via INTRAMUSCULAR

## 2020-03-16 MED ORDER — TRIAMCINOLONE ACETONIDE 0.1 % EX CREA: 1.0000 "application " | TOPICAL_CREAM | Freq: Two times a day (BID) | CUTANEOUS | 0 refills | Status: DC

## 2020-03-16 MED ORDER — CLOTRIMAZOLE 1 % EX CREA: TOPICAL_CREAM | CUTANEOUS | 0 refills | Status: DC

## 2020-03-16 NOTE — ED Provider Notes (Signed)
[name]    CSN: [id number] Arrival date & time: 03/16/20  1327      History   Chief Complaint Chief Complaint  Patient presents with  . Back Pain  . Shoulder Injury  . Shoulder Pain  . Rash    HPI Kayla [name] is a 74 y.o. female  With history as below presenting for numerous concerns: Patient noting acute on chronic right low back pain that extends down to her right hamstring.  Denies injury, inciting event.  No urinary or fecal incontinence, saddle area anesthesia.  Denies trauma or injury to affected area.  Patient also notes left ear clogging sensation as well as fatigue, mild nasal congestion and dry cough.  Patient requesting urine dipstick as she has history of sepsis.  Denying pelvic pain, vaginal discharge, fever.  Also requesting refill of creams for pruritic rash under breasts.  Overall history limited due to multiple complaints and overlapping timelines.  Past Medical History:  Diagnosis Date  . Anemia   . Anxiety   . Asthma   . B12 deficiency   . Bursitis   . Chronic LBP   . Chronic UTI   . Diastolic dysfunction   . Fibromyalgia   . GERD (gastroesophageal reflux disease)   . Hyperlipemia   . Hypertension   . Hypothyroid   . IBS (irritable bowel syndrome)   . Inflammatory osteoarthritis   . Kidney infection   . Psoriatic arthritis ([place])   . S/P cardiac cath 11/30/2007   normal coronaries - Dr. Burt Knack (Dr. Debara Pickett reviewed films on 01/10/2016)    Patient Active Problem List   Diagnosis Date Noted  . Renal insufficiency 12/29/2018  . Cyst of left ovary 12/29/2018  . Esophageal dysmotility 12/29/2018  . 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 Acquired hypothyroidism 12/29/2018  . Chronic maxillary sinusitis 07/07/2018  . Snoring 06/01/2018  . Costochondritis 06/01/2018  . Family history of heart disease 06/01/2018  . Class 2 severe obesity due to excess calories with serious comorbidity and body mass index (BMI) of  38.0 to 38.9 in adult ([place]) 06/01/2018  . Multiple atypical skin moles 04/28/2017  . Depression, recurrent ([place]) 07/16/2016  . Adjustment disorder with mixed anxiety and depressed mood 01/15/2016  . Fe Def Anemia 12/22/2015  . Scoliosis (and kyphoscoliosis), idiopathic 12/20/2015  . Osteopenia 12/20/2015  . Fibromyalgia 08/02/2014  . GAD (generalized anxiety disorder) 09/17/2012  . Vitamin D deficiency 01/24/2012  . B12 deficiency   . Hyperlipidemia 11/30/2007  . Coronary artery disease involving native heart without angina pectoris 11/30/2007  . Essential hypertension 01/27/2007  . Reactive airway disease that is not asthma 01/27/2007  . GERD 01/27/2007  . IBS 01/27/2007    Past Surgical History:  Procedure Laterality Date  . CESAREAN SECTION  06/10/1975  . CHOLECYSTECTOMY    . NECK SURGERY  1998  . repair broken C6 & C7      OB History   No obstetric history on file.      Home Medications    Prior to Admission medications   Medication Sig Start Date End Date Taking? Authorizing Provider  aspirin EC 81 MG tablet Take 1 tablet (81 mg total) by mouth daily. 12/15/18   Buford Dresser, MD  Calcium-Magnesium-Zinc (CAL-MAG-ZINC PO) Take by mouth 2 (two) times daily.    [provider]  chlorthalidone (HYGROTON) 25 MG tablet Take 1 tablet (25 mg total) by mouth daily. 04/14/19 04/08/20  Buford Dresser, MD  clotrimazole (LOTRIMIN) 1 %  cream Apply to affected area 2 times daily 03/16/20   Hall-Potvin, [place], PA-C  cyanocobalamin ([employer] VITAMIN B12) 1000 MCG tablet Take 1 tablet (1,000 mcg total) by mouth daily. 03/02/19   Ladell Pier, MD  diltiazem (TIAZAC) 360 MG 24 hr capsule Take 1 capsule (360 mg total) by mouth daily. 03/09/19   Buford Dresser, MD  ferrous sulfate 325 (65 FE) MG tablet ferrous sulfate 325 mg (65 mg iron) tablet  TAKE 1 TABLET BY MOUTH 2 TIMES DAILY WITH A MEAL    [provider]  levothyroxine (SYNTHROID) 112 MCG  tablet Take 1 tablet (112 mcg total) by mouth daily. 07/12/19   Nicolette Bang, DO  miconazole (MICOTIN) 2 % powder Apply topically as needed for itching. 03/16/20   Hall-Potvin, [place], PA-C  nystatin (MYCOSTATIN/NYSTOP) powder Apply 1 application topically 3 (three) times daily. 02/17/20   Nicolette Bang, DO  Omega-3 Fatty Acids (FISH OIL) 1000 MG CAPS Take by mouth. 01/15/16   [provider]  POTASSIUM CHLORIDE PO Take by mouth.    [provider]  pyridOXINE (VITAMIN B-6) 100 MG tablet Take 100 mg by mouth daily.    [provider]  tiZANidine (ZANAFLEX) 2 MG tablet Take 1 tablet (2 mg total) by mouth every 8 (eight) hours as needed for muscle spasms. 03/01/20   Nicolette Bang, DO  triamcinolone (KENALOG) 0.1 % Apply 1 application topically 2 (two) times daily. 03/16/20   Hall-Potvin, [place], PA-C    Family History Family History  Problem Relation Age of Onset  . Heart attack Father 34  . Lung cancer Other        uncle, non smoker  . Leukemia Other        uncle  . Cancer Mother        oral  . Stroke Mother   . Alzheimer's disease Mother        at 35  . Cancer Maternal Aunt        breast  . Alcohol abuse Maternal Aunt   . Alcohol abuse Maternal Uncle   . Cancer Maternal Uncle        lung, stomach, oral  . Cancer Paternal Uncle        lung  . Congestive Heart Failure Maternal Grandfather   . Heart disease Maternal Grandfather   . Heart attack Paternal Grandfather   . Cancer Paternal Uncle        bone  . Brain cancer Maternal Grandmother   . Alzheimer's disease Other        maternal great grandmother   . Colon cancer Neg Hx   . Breast cancer Neg Hx   . Diabetes Neg Hx     Social History Social History   Tobacco Use  . Smoking status: Never Smoker  . Smokeless tobacco: Never Used  Vaping Use  . Vaping Use: Never used  Substance Use Topics  . Alcohol use: No  . Drug use: No     Allergies   Ace  inhibitors and Lisinopril   Review of Systems Review of Systems  Constitutional: Positive for fatigue. Negative for fever.  HENT: Positive for congestion and ear pain. Negative for ear discharge, sinus pain, sore throat and voice change.   Eyes: Negative for pain, redness and visual disturbance.  Respiratory: Positive for cough. Negative for shortness of breath and wheezing.   Cardiovascular: Negative for chest pain and palpitations.  Gastrointestinal: Negative for abdominal pain, diarrhea and vomiting.  Musculoskeletal: Positive  for back pain. Negative for arthralgias and myalgias.  Skin: Positive for rash. Negative for wound.  Neurological: Negative for syncope and headaches.     Physical Exam Triage Vital Signs ED Triage Vitals  Enc Vitals Group     BP 03/16/20 1351 140/69     Pulse Rate 03/16/20 1351 80     Resp 03/16/20 1351 18     Temp 03/16/20 1351 98.6 F (37 C)     Temp Source 03/16/20 1351 Oral     SpO2 03/16/20 1351 96 %     Weight --      Height --      Head Circumference --      Peak Flow --      Pain Score 03/16/20 1352 10     Pain Loc --      Pain Edu? --      Excl. in [place]? --    No data found.  Updated Vital Signs BP 140/69 (BP Location: Right Arm)   Pulse 80   Temp 98.6 F (37 C) (Oral)   Resp 18   SpO2 96%   Visual Acuity Right Eye Distance:   Left Eye Distance:   Bilateral Distance:    Right Eye Near:   Left Eye Near:    Bilateral Near:     Physical Exam Constitutional:      General: She is not in acute distress.    Appearance: She is obese. She is not ill-appearing or diaphoretic.  HENT:     Head: Normocephalic and atraumatic.     Right Ear: Tympanic membrane, ear canal and external ear normal.     Left Ear: Tympanic membrane, ear canal and external ear normal.     Mouth/Throat:     Mouth: Mucous membranes are moist.     Pharynx: Oropharynx is clear. No oropharyngeal exudate or posterior oropharyngeal erythema.  Eyes:     General:  No scleral icterus.    Conjunctiva/sclera: Conjunctivae normal.     Pupils: Pupils are equal, round, and reactive to light.  Neck:     Comments: Trachea midline, negative JVD Cardiovascular:     Rate and Rhythm: Normal rate and regular rhythm.     Heart sounds: No murmur heard. No gallop.   Pulmonary:     Effort: Pulmonary effort is normal. No respiratory distress.     Breath sounds: No wheezing, rhonchi or rales.  Musculoskeletal:        General: Tenderness present. No swelling.     Cervical back: Neck supple. No tenderness.     Comments: Mild right-sided lumbar tenderness is paraspinous process, PSIS.  NVI  Lymphadenopathy:     Cervical: No cervical adenopathy.  Skin:    Capillary Refill: Capillary refill takes less than 2 seconds.     Coloration: Skin is not jaundiced or pale.     Findings: Rash present.     Comments: Mild dermatitis under breasts  Neurological:     General: No focal deficit present.     Mental Status: She is alert and oriented to person, place, and time.      UC Treatments / Results  Labs (all labs ordered are listed, but only abnormal results are displayed) Labs Reviewed  POCT URINALYSIS DIP (MANUAL ENTRY) - Abnormal; Notable for the following components:      Result Value   Blood, UA trace-intact (*)    Leukocytes, UA Trace (*)    All other components within normal limits  NOVEL CORONAVIRUS,  NAA  URINE CULTURE    EKG   Radiology No results found.  Procedures Procedures (including critical care time)  Medications Ordered in UC Medications  methylPREDNISolone sodium succinate (SOLU-MEDROL) 125 mg/2 mL injection 80 mg (80 mg Intramuscular Given 03/16/20 1436)    Initial Impression / Assessment and Plan / UC Course  I have reviewed the triage vital signs and the nursing notes.  Pertinent labs & imaging results that were available during my care of the patient were reviewed by me and considered in my medical decision making (see chart for  details).     Mood multiple concerns, though afebrile, nontoxic, and without acute distress.  Regarding acute on chronic low back pain: Willing to give Solu-Medrol in office, follow-up with orthopedic/PCP for further evaluation and management.  Covid test pending as patient is concerned for Covid given mild URI symptoms.  Will send refill for dermatitis, cover for Candida as well.  Return precautions discussed, pt verbalized understanding and is agreeable to plan. Final Clinical Impressions(s) / UC Diagnoses   Final diagnoses:  Encounter for screening for COVID-19  Acute right-sided low back pain with right-sided sciatica  Dermatitis     Discharge Instructions     Keep skin clean and dry. May apply triamcinolone twice daily x1 week. Avoid hot water as this can further dry out and irritate skin. Important to wash all clothes, bedding, blankets in hot water. Return for worsening rash, pain, swelling, redness, fever.    ED Prescriptions    Medication Sig Dispense Auth. Provider   triamcinolone (KENALOG) 0.1 % Apply 1 application topically 2 (two) times daily. 45 each Hall-Potvin, [place], PA-C   clotrimazole (LOTRIMIN) 1 % cream Apply to affected area 2 times daily 15 g Hall-Potvin, [place], PA-C   miconazole (MICOTIN) 2 % powder Apply topically as needed for itching. 70 g Hall-Potvin, [place], PA-C     PDMP not reviewed this encounter.   Hall-Potvin, [place], [place] 03/16/20 1527

## 2020-03-16 NOTE — Discharge Instructions (Addendum)
Keep skin clean and dry. May apply triamcinolone twice daily x1 week. Avoid hot water as this can further dry out and irritate skin. Important to wash all clothes, bedding, blankets in hot water. Return for worsening rash, pain, swelling, redness, fever.

## 2020-03-16 NOTE — ED Triage Notes (Addendum)
Pt said she has been having back pain on the right side. Goes down into the right hip and right knee. Pt said also left shoulder into her left arm. Pt also saying possible kidney infection. Pt also saying possible Covid bc of symptoms she had yesterday. Left ear feels clogged also

## 2020-03-17 LAB — NOVEL CORONAVIRUS, NAA: SARS-CoV-2, NAA: NOT DETECTED

## 2020-03-17 LAB — SARS-COV-2, NAA 2 DAY TAT

## 2020-03-18 LAB — URINE CULTURE: Culture: NO GROWTH

## 2020-03-26 ENCOUNTER — Other Ambulatory Visit: Payer: Self-pay | Admitting: Cardiology

## 2020-03-26 DIAGNOSIS — I10 Essential (primary) hypertension: Secondary | ICD-10-CM

## 2020-03-29 ENCOUNTER — Emergency Department (HOSPITAL_COMMUNITY): Payer: Medicare PPO

## 2020-03-29 ENCOUNTER — Emergency Department (HOSPITAL_COMMUNITY)
Admission: EM | Admit: 2020-03-29 | Discharge: 2020-03-29 | Disposition: A | Discharge status: Home / Self Care | Payer: Medicare PPO | Attending: Emergency Medicine | Admitting: Emergency Medicine

## 2020-03-29 ENCOUNTER — Other Ambulatory Visit: Payer: Self-pay

## 2020-03-29 ENCOUNTER — Encounter (HOSPITAL_COMMUNITY): Payer: Self-pay | Admitting: Emergency Medicine

## 2020-03-29 DIAGNOSIS — I110 Hypertensive heart disease with heart failure: Secondary | ICD-10-CM | POA: Insufficient documentation

## 2020-03-29 DIAGNOSIS — M791 Myalgia, unspecified site: Secondary | ICD-10-CM | POA: Insufficient documentation

## 2020-03-29 DIAGNOSIS — E039 Hypothyroidism, unspecified: Secondary | ICD-10-CM | POA: Insufficient documentation

## 2020-03-29 DIAGNOSIS — M5431 Sciatica, right side: Secondary | ICD-10-CM

## 2020-03-29 DIAGNOSIS — M25512 Pain in left shoulder: Secondary | ICD-10-CM | POA: Diagnosis not present

## 2020-03-29 DIAGNOSIS — Z7982 Long term (current) use of aspirin: Secondary | ICD-10-CM | POA: Insufficient documentation

## 2020-03-29 DIAGNOSIS — I5030 Unspecified diastolic (congestive) heart failure: Secondary | ICD-10-CM | POA: Insufficient documentation

## 2020-03-29 DIAGNOSIS — M25551 Pain in right hip: Secondary | ICD-10-CM | POA: Insufficient documentation

## 2020-03-29 DIAGNOSIS — I2510 Atherosclerotic heart disease of native coronary artery without angina pectoris: Secondary | ICD-10-CM | POA: Insufficient documentation

## 2020-03-29 DIAGNOSIS — J45909 Unspecified asthma, uncomplicated: Secondary | ICD-10-CM | POA: Diagnosis not present

## 2020-03-29 DIAGNOSIS — M79604 Pain in right leg: Secondary | ICD-10-CM | POA: Insufficient documentation

## 2020-03-29 DIAGNOSIS — Z79899 Other long term (current) drug therapy: Secondary | ICD-10-CM | POA: Insufficient documentation

## 2020-03-29 DIAGNOSIS — M5430 Sciatica, unspecified side: Secondary | ICD-10-CM | POA: Insufficient documentation

## 2020-03-29 LAB — CBC
HCT: 44.2 % (ref 36.0–46.0)
Hemoglobin: 14.7 g/dL (ref 12.0–15.0)
MCH: 32.6 pg (ref 26.0–34.0)
MCHC: 33.3 g/dL (ref 30.0–36.0)
MCV: 98 fL (ref 80.0–100.0)
Platelets: 348 10*3/uL (ref 150–400)
RBC: 4.51 MIL/uL (ref 3.87–5.11)
RDW: 12.8 % (ref 11.5–15.5)
WBC: 12.9 10*3/uL — ABNORMAL HIGH (ref 4.0–10.5)
nRBC: 0 % (ref 0.0–0.2)

## 2020-03-29 LAB — URINALYSIS, ROUTINE W REFLEX MICROSCOPIC
Bilirubin Urine: NEGATIVE
Glucose, UA: NEGATIVE mg/dL
Hgb urine dipstick: NEGATIVE
Ketones, ur: NEGATIVE mg/dL
Leukocytes,Ua: NEGATIVE
Nitrite: NEGATIVE
Protein, ur: NEGATIVE mg/dL
Specific Gravity, Urine: 1.009 (ref 1.005–1.030)
pH: 6 (ref 5.0–8.0)

## 2020-03-29 LAB — COMPREHENSIVE METABOLIC PANEL
ALT: 17 U/L (ref 0–44)
AST: 20 U/L (ref 15–41)
Albumin: 4.2 g/dL (ref 3.5–5.0)
Alkaline Phosphatase: 62 U/L (ref 38–126)
Anion gap: 12 (ref 5–15)
BUN: 17 mg/dL (ref 8–23)
CO2: 24 mmol/L (ref 22–32)
Calcium: 9.6 mg/dL (ref 8.9–10.3)
Chloride: 104 mmol/L (ref 98–111)
Creatinine, Ser: 1.2 mg/dL — ABNORMAL HIGH (ref 0.44–1.00)
GFR, Estimated: 48 mL/min — ABNORMAL LOW (ref >60)
Glucose, Bld: 116 mg/dL — ABNORMAL HIGH (ref 70–99)
Potassium: 3 mmol/L — ABNORMAL LOW (ref 3.5–5.1)
Sodium: 140 mmol/L (ref 135–145)
Total Bilirubin: 0.5 mg/dL (ref 0.3–1.2)
Total Protein: 7.4 g/dL (ref 6.5–8.1)

## 2020-03-29 MED ORDER — PREDNISONE 20 MG PO TABS
20.0000 mg | ORAL_TABLET | Freq: Once | ORAL | Status: AC
  Administered 2020-03-29: 20 mg via ORAL
  Filled 2020-03-29: qty 1

## 2020-03-29 MED ORDER — LIDOCAINE 5 % EX PTCH: 1.0000 | MEDICATED_PATCH | CUTANEOUS | 0 refills | Status: DC

## 2020-03-29 MED ORDER — LIDOCAINE 5 % EX PTCH
1.0000 | MEDICATED_PATCH | CUTANEOUS | Status: DC
  Administered 2020-03-29: 1 via TRANSDERMAL
  Filled 2020-03-29: qty 1

## 2020-03-29 MED ORDER — PREDNISONE 10 MG PO TABS: 20.0000 mg | ORAL_TABLET | Freq: Every day | ORAL | 0 refills | Status: AC

## 2020-03-29 NOTE — Discharge Instructions (Addendum)
You have been seen and discharged from the emergency department.  Follow-up with your primary provider for reevaluation. Take new prescriptions and home medications as prescribed. If you have any worsening symptoms or further concerns or health please return to emergency department for further evaluation. 

## 2020-03-29 NOTE — ED Triage Notes (Signed)
Patient is having 'whole right side pain' and left shoulder pain. She recently got a steroid shot and had a bad reaction to that, she was awake for 36 hours and had a bad headache. Patient presents with multiple complaints, multiple sites of pain. Denies trauma or falls.

## 2020-03-29 NOTE — ED Provider Notes (Addendum)
[place] DEPT Provider Note   CSN: [id number] Arrival date & time: 03/29/20  1451     History Chief Complaint  Patient presents with  . right side pain    Kayla [name] is a 74 y.o. female.  HPI   74 year old female past medical history of HTN, HLD, fibromyalgia, severe osteoarthritis presents the emergency department with full right-sided body pain.  Patient states that she is chronically in whole body pain.  More recently the right side of her body has been increasingly painful, specifically her right buttocks with sharp radiation down her right leg.  A couple weeks ago she does admit that she had a fall over her dog where she fell down onto her right hip and thigh but otherwise denies any traumatic injury. Denies any specific chest pain, difficulty breathing, vomiting/diarrhea, swelling of her extremities.  Patient had mentioned left shoulder pain to triage, she states this is acute on chronic, currently baseline, reproducible with movement, does not radiate into her chest/back.  Past Medical History:  Diagnosis Date  . Anemia   . Anxiety   . Asthma   . B12 deficiency   . Bursitis   . Chronic LBP   . Chronic UTI   . Diastolic dysfunction   . Fibromyalgia   . GERD (gastroesophageal reflux disease)   . Hyperlipemia   . Hypertension   . Hypothyroid   . IBS (irritable bowel syndrome)   . Inflammatory osteoarthritis   . Kidney infection   . Psoriatic arthritis ([place])   . S/P cardiac cath 11/30/2007   normal coronaries - Dr. Burt Knack (Dr. Debara Pickett reviewed films on 01/10/2016)    Patient Active Problem List   Diagnosis Date Noted  . Renal insufficiency 12/29/2018  . Cyst of left ovary 12/29/2018  . Esophageal dysmotility 12/29/2018  . 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 Acquired hypothyroidism 12/29/2018  . Chronic maxillary sinusitis 07/07/2018  . Snoring 06/01/2018  . Costochondritis 06/01/2018  . Family history of  heart disease 06/01/2018  . Class 2 severe obesity due to excess calories with serious comorbidity and body mass index (BMI) of 38.0 to 38.9 in adult ([place]) 06/01/2018  . Multiple atypical skin moles 04/28/2017  . Depression, recurrent ([place]) 07/16/2016  . Adjustment disorder with mixed anxiety and depressed mood 01/15/2016  . Fe Def Anemia 12/22/2015  . Scoliosis (and kyphoscoliosis), idiopathic 12/20/2015  . Osteopenia 12/20/2015  . Fibromyalgia 08/02/2014  . GAD (generalized anxiety disorder) 09/17/2012  . Vitamin D deficiency 01/24/2012  . B12 deficiency   . Hyperlipidemia 11/30/2007  . Coronary artery disease involving native heart without angina pectoris 11/30/2007  . Essential hypertension 01/27/2007  . Reactive airway disease that is not asthma 01/27/2007  . GERD 01/27/2007  . IBS 01/27/2007    Past Surgical History:  Procedure Laterality Date  . CESAREAN SECTION  06/10/1975  . CHOLECYSTECTOMY    . NECK SURGERY  1998  . repair broken C6 & C7       OB History   No obstetric history on file.     Family History  Problem Relation Age of Onset  . Heart attack Father 72  . Lung cancer Other        uncle, non smoker  . Leukemia Other        uncle  . Cancer Mother        oral  . Stroke Mother   . Alzheimer's disease Mother        at  61  . Cancer Maternal Aunt        breast  . Alcohol abuse Maternal Aunt   . Alcohol abuse Maternal Uncle   . Cancer Maternal Uncle        lung, stomach, oral  . Cancer Paternal Uncle        lung  . Congestive Heart Failure Maternal Grandfather   . Heart disease Maternal Grandfather   . Heart attack Paternal Grandfather   . Cancer Paternal Uncle        bone  . Brain cancer Maternal Grandmother   . Alzheimer's disease Other        maternal great grandmother   . Colon cancer Neg Hx   . Breast cancer Neg Hx   . Diabetes Neg Hx     Social History   Tobacco Use  . Smoking status: Never Smoker  . Smokeless tobacco: Never Used   Vaping Use  . Vaping Use: Never used  Substance Use Topics  . Alcohol use: No  . Drug use: No    Home Medications Prior to Admission medications   Medication Sig Start Date End Date Taking? Authorizing Provider  chlorthalidone (HYGROTON) 25 MG tablet TAKE 1 TABLET BY MOUTH EVERY DAY 03/27/20   Buford Dresser, MD  aspirin EC 81 MG tablet Take 1 tablet (81 mg total) by mouth daily. 12/15/18   Buford Dresser, MD  Calcium-Magnesium-Zinc (CAL-MAG-ZINC PO) Take by mouth 2 (two) times daily.    [provider]  clotrimazole (LOTRIMIN) 1 % cream Apply to affected area 2 times daily 03/16/20   Hall-Potvin, [place], PA-C  cyanocobalamin ([employer] VITAMIN B12) 1000 MCG tablet Take 1 tablet (1,000 mcg total) by mouth daily. 03/02/19   Ladell Pier, MD  diltiazem (TIAZAC) 360 MG 24 hr capsule Take 1 capsule (360 mg total) by mouth daily. 03/09/19   Buford Dresser, MD  ferrous sulfate 325 (65 FE) MG tablet ferrous sulfate 325 mg (65 mg iron) tablet  TAKE 1 TABLET BY MOUTH 2 TIMES DAILY WITH A MEAL    [provider]  levothyroxine (SYNTHROID) 112 MCG tablet Take 1 tablet (112 mcg total) by mouth daily. 07/12/19   Nicolette Bang, DO  miconazole (MICOTIN) 2 % powder Apply topically as needed for itching. 03/16/20   Hall-Potvin, [place], PA-C  nystatin (MYCOSTATIN/NYSTOP) powder Apply 1 application topically 3 (three) times daily. 02/17/20   Nicolette Bang, DO  Omega-3 Fatty Acids (FISH OIL) 1000 MG CAPS Take by mouth. 01/15/16   [provider]  POTASSIUM CHLORIDE PO Take by mouth.    [provider]  pyridOXINE (VITAMIN B-6) 100 MG tablet Take 100 mg by mouth daily.    [provider]  tiZANidine (ZANAFLEX) 2 MG tablet Take 1 tablet (2 mg total) by mouth every 8 (eight) hours as needed for muscle spasms. 03/01/20   Nicolette Bang, DO  triamcinolone (KENALOG) 0.1 % Apply 1 application topically 2 (two) times  daily. 03/16/20   Hall-Potvin, [place], PA-C    Allergies    Ace inhibitors and Lisinopril  Review of Systems   Review of Systems  Constitutional: Negative for chills and fever.  HENT: Negative for congestion.   Eyes: Negative for visual disturbance.  Respiratory: Negative for chest tightness and shortness of breath.   Cardiovascular: Negative for chest pain and leg swelling.  Gastrointestinal: Negative for abdominal pain, diarrhea and vomiting.  Genitourinary: Negative for dysuria.  Musculoskeletal:       +whole body pain worse  on the right  Skin: Negative for rash.  Neurological: Negative for weakness, numbness and headaches.  Psychiatric/Behavioral: The patient is nervous/anxious.     Physical Exam Updated Vital Signs BP (!) 157/96   Pulse 83   Temp 98.1 F (36.7 C) (Oral)   Resp 18   SpO2 95%   Physical Exam Vitals and nursing note reviewed.  Constitutional:      Appearance: Normal appearance.  HENT:     Head: Normocephalic.     Mouth/Throat:     Mouth: Mucous membranes are moist.  Eyes:     Pupils: Pupils are equal, round, and reactive to light.  Cardiovascular:     Rate and Rhythm: Normal rate.  Pulmonary:     Effort: Pulmonary effort is normal. No respiratory distress.  Abdominal:     Palpations: Abdomen is soft.     Tenderness: There is no abdominal tenderness.  Musculoskeletal:        General: No swelling or deformity.     Comments: Diffuse tenderness to palpation of the right side of the body and multiple joints and areas on the extremity, no overlying skin changes, mild tenderness to palpation of the right buttocks area with +straight leg test on right, she is neurovascularly intact in the extremities  Skin:    General: Skin is warm.     Findings: No bruising or erythema.  Neurological:     Mental Status: She is alert and oriented to person, place, and time. Mental status is at baseline.     Motor: No weakness.  Psychiatric:        Mood and Affect:  Mood normal.     ED Results / Procedures / Treatments   Labs (all labs ordered are listed, but only abnormal results are displayed) Labs Reviewed  COMPREHENSIVE METABOLIC PANEL - Abnormal; Notable for the following components:      Result Value   Potassium 3.0 (*)    Glucose, Bld 116 (*)    Creatinine, Ser 1.20 (*)    GFR, Estimated 48 (*)    All other components within normal limits  CBC - Abnormal; Notable for the following components:   WBC 12.9 (*)    All other components within normal limits  URINALYSIS, ROUTINE W REFLEX MICROSCOPIC - Abnormal; Notable for the following components:   Color, Urine STRAW (*)    All other components within normal limits    EKG None  Radiology No results found.  Procedures Procedures (including critical care time)  Medications Ordered in ED Medications - No data to display  ED Course  I have reviewed the triage vital signs and the nursing notes.  Pertinent labs & imaging results that were available during my care of the patient were reviewed by me and considered in my medical decision making (see chart for details).    MDM Rules/Calculators/A&P                          74 year old female presents the emergency department whole body pain that is worse on the right.  She did have a mechanical fall a couple weeks ago with some right hip pain.  We will x-ray to rule out fracture which I have a low suspicion for.  I do not see any signs of acute injury, this is acute on chronic diffuse pain but the right buttocks and lower extremity pain has a sciatic-like component.  She is pleasant.  XR ordered.  X-rays are  negative, I believe patient is suffering from acute on chronic body pain with component of sciatica.  She will be treated as such.  Patient will be discharged and treated as an outpatient.  Discharge plan discussed, patient verbalizes understanding and agreement.  Final Clinical Impression(s) / ED Diagnoses Final diagnoses:  None     Rx / DC Orders ED Discharge Orders    None       Jonta Gastineau, Alvin Critchley, DO 03/29/20 2031    Lorelle Gibbs, DO 03/29/20 2206

## 2020-04-13 ENCOUNTER — Other Ambulatory Visit: Payer: Medicare PPO

## 2020-04-17 ENCOUNTER — Ambulatory Visit (INDEPENDENT_AMBULATORY_CARE_PROVIDER_SITE_OTHER): Payer: Medicare PPO | Admitting: Family

## 2020-04-17 ENCOUNTER — Other Ambulatory Visit: Payer: Self-pay

## 2020-04-17 ENCOUNTER — Encounter: Payer: Self-pay | Admitting: Family

## 2020-04-17 VITALS — BP 127/86 | HR 95 | Wt 212.4 lb

## 2020-04-17 DIAGNOSIS — Z09 Encounter for follow-up examination after completed treatment for conditions other than malignant neoplasm: Secondary | ICD-10-CM

## 2020-04-17 DIAGNOSIS — M5431 Sciatica, right side: Secondary | ICD-10-CM | POA: Diagnosis not present

## 2020-04-17 DIAGNOSIS — R1310 Dysphagia, unspecified: Secondary | ICD-10-CM | POA: Insufficient documentation

## 2020-04-17 MED ORDER — TIZANIDINE HCL 2 MG PO TABS: 2.0000 mg | ORAL_TABLET | Freq: Three times a day (TID) | ORAL | 0 refills | Status: DC | PRN

## 2020-04-17 NOTE — Progress Notes (Signed)
Patient ID: Kayla [name], female    DOB: June 21, 1945  MRN: [id number]  CC: Hospital Follow-Up  Subjective: Kayla [name] is a 75 y.o. female who presents for hospital follow-up.  1. ER FOLLOW UP: 03/29/2020: Visit at the [facility] Emergency Department. Patient with whole body pain that is worse at night. Had a mechanical fall a couple weeks ago with some right hip pain. X-ray to rule out fracture which was negative. Treated for acute on chronic body pain with a component of sciatica.   04/17/2020: Time since discharge: 19 days Hospital/facility: [facility] Emergency Department Diagnosis: sciatica of right side Procedures/tests: CMP, CBC, urinalysis, x-ray right knee Consultants: none New medications: none Discharge instructions: follow-up with PCP Status: fluctuating   Patient Active Problem List   Diagnosis Date Noted  . Dysphagia 04/17/2020  . Renal insufficiency 12/29/2018  . Cyst of left ovary 12/29/2018  . Esophageal dysmotility 12/29/2018  . 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 Acquired hypothyroidism 12/29/2018  . Chronic maxillary sinusitis 07/07/2018  . Snoring 06/01/2018  . Costochondritis 06/01/2018  . Family history of heart disease 06/01/2018  . Class 2 severe obesity due to excess calories with serious comorbidity and body mass index (BMI) of 38.0 to 38.9 in adult ([place]) 06/01/2018  . Multiple atypical skin moles 04/28/2017  . Depression, recurrent ([place]) 07/16/2016  . Adjustment disorder with mixed anxiety and depressed mood 01/15/2016  . Fe Def Anemia 12/22/2015  . Scoliosis (and kyphoscoliosis), idiopathic 12/20/2015  . Osteopenia 12/20/2015  . Fibromyalgia 08/02/2014  . GAD (generalized anxiety disorder) 09/17/2012  . Vitamin D deficiency 01/24/2012  . B12 deficiency   . Hyperlipidemia 11/30/2007  . Coronary artery disease involving native heart without angina pectoris 11/30/2007  . Essential  hypertension 01/27/2007  . Reactive airway disease that is not asthma 01/27/2007  . GERD 01/27/2007  . IBS 01/27/2007     Current Outpatient Medications on File Prior to Visit  Medication Sig Dispense Refill  . aspirin EC 81 MG tablet Take 1 tablet (81 mg total) by mouth daily.    . Calcium-Magnesium-Zinc (CAL-MAG-ZINC PO) Take by mouth 2 (two) times daily.    . chlorthalidone (HYGROTON) 25 MG tablet TAKE 1 TABLET BY MOUTH EVERY DAY 90 tablet 3  . clotrimazole (LOTRIMIN) 1 % cream Apply to affected area 2 times daily 15 g 0  . cyanocobalamin ([employer] VITAMIN B12) 1000 MCG tablet Take 1 tablet (1,000 mcg total) by mouth daily. 90 tablet 1  . diltiazem (TIAZAC) 360 MG 24 hr capsule Take 1 capsule (360 mg total) by mouth daily. 90 capsule 1  . ferrous sulfate 325 (65 FE) MG tablet ferrous sulfate 325 mg (65 mg iron) tablet  TAKE 1 TABLET BY MOUTH 2 TIMES DAILY WITH A MEAL    . levothyroxine (SYNTHROID) 112 MCG tablet Take 1 tablet (112 mcg total) by mouth daily. 90 tablet 3  . lidocaine (LIDODERM) 5 % Place 1 patch onto the skin daily. Remove & Discard patch within 12 hours or as directed by MD 30 patch 0  . miconazole (MICOTIN) 2 % powder Apply topically as needed for itching. 70 g 0  . nystatin (MYCOSTATIN/NYSTOP) powder Apply 1 application topically 3 (three) times daily. 60 g 0  . Omega-3 Fatty Acids (FISH OIL) 1000 MG CAPS Take by mouth.    Marland Kitchen POTASSIUM CHLORIDE PO Take by mouth.    . pyridOXINE (VITAMIN B-6) 100 MG tablet Take 100 mg by mouth  daily.    . triamcinolone (KENALOG) 0.1 % Apply 1 application topically 2 (two) times daily. 45 each 0   No current facility-administered medications on file prior to visit.    Allergies  Allergen Reactions  . Ace Inhibitors Anaphylaxis    REACTION: glossal edema  . Lisinopril Anaphylaxis  . Other Nausea Only    Social History   Socioeconomic History  . Marital status: Widowed    Spouse name: Not on file  . Number of children: 2  . Years  of education: Not on file  . Highest education level: Bachelor's degree (e.g., BA, AB, BS)  Occupational History  . Not on file  Tobacco Use  . Smoking status: Never Smoker  . Smokeless tobacco: Never Used  Vaping Use  . Vaping Use: Never used  Substance and Sexual Activity  . Alcohol use: No  . Drug use: No  . Sexual activity: Not on file  Other Topics Concern  . Not on file  Social History Narrative   Lost husband 2009, lost mother 2016      Lives at home (son & 3 grandchildren live with her)   Caffeine: 10-12 oz daily   Social Determinants of Health   Financial Resource Strain: Not on file  Food Insecurity: Not on file  Transportation Needs: Not on file  Physical Activity: Not on file  Stress: Not on file  Social Connections: Not on file  Intimate Partner Violence: Not on file    Family History  Problem Relation Age of Onset  . Heart attack Father 1  . Lung cancer Other        uncle, non smoker  . Leukemia Other        uncle  . Cancer Mother        oral  . Stroke Mother   . Alzheimer's disease Mother        at 69  . Cancer Maternal Aunt        breast  . Alcohol abuse Maternal Aunt   . Alcohol abuse Maternal Uncle   . Cancer Maternal Uncle        lung, stomach, oral  . Cancer Paternal Uncle        lung  . Congestive Heart Failure Maternal Grandfather   . Heart disease Maternal Grandfather   . Heart attack Paternal Grandfather   . Cancer Paternal Uncle        bone  . Brain cancer Maternal Grandmother   . Alzheimer's disease Other        maternal great grandmother   . Colon cancer Neg Hx   . Breast cancer Neg Hx   . Diabetes Neg Hx     Past Surgical History:  Procedure Laterality Date  . CESAREAN SECTION  06/10/1975  . CHOLECYSTECTOMY    . NECK SURGERY  1998  . repair broken C6 & C7      ROS: Review of Systems Negative except as stated above  PHYSICAL EXAM: BP 127/86 (BP Location: Right Arm, Patient Position: Sitting)   Pulse 95   Wt  212 lb 6.4 oz (96.3 kg)   SpO2 96%   BMI 35.35 kg/m   Physical Exam Constitutional:      Appearance: She is obese.  HENT:     Head: Normocephalic.  Eyes:     Extraocular Movements: Extraocular movements intact.     Pupils: Pupils are equal, round, and reactive to light.  Cardiovascular:     Rate and Rhythm: Normal rate  and regular rhythm.     Pulses: Normal pulses.     Heart sounds: Normal heart sounds.  Pulmonary:     Effort: Pulmonary effort is normal.     Breath sounds: Normal breath sounds.  Musculoskeletal:     Cervical back: Normal range of motion and neck supple.     Right hip: Tenderness present.  Neurological:     General: No focal deficit present.     Mental Status: She is alert and oriented to person, place, and time.  Psychiatric:        Mood and Affect: Mood normal.        Behavior: Behavior normal.     ASSESSMENT AND PLAN: 1. Hospital discharge follow-up: 2. Right sided sciatica: - Visit on 03/29/2021 at the [facility] Emergency Department. Patient with whole body pain that is worse at night. Had a mechanical fall a couple weeks ago with some right hip pain. X-ray to rule out fracture which was negative. Treated for acute on chronic body pain with a component of sciatica.  - Today patient still experiencing pain.  - Continue Tizanidine as prescribed. May cause drowsiness. Counseled patient to not consume if operating heavy machinery or driving. Counseled patient to not consume with alcohol. - Referral to Neurology for further evaluation and management. - tiZANidine (ZANAFLEX) 2 MG tablet; Take 1 tablet (2 mg total) by mouth every 8 (eight) hours as needed for muscle spasms.  Dispense: 15 tablet; Refill: 0 - Ambulatory referral to Neurology  Patient was given the opportunity to ask questions.  Patient verbalized understanding of the plan and was able to repeat key elements of the plan. Patient was given clear instructions to go to Emergency  Department or return to medical center if symptoms don't improve, worsen, or new problems develop.The patient verbalized understanding.   Orders Placed This Encounter  Procedures  . Ambulatory referral to Neurology     Requested Prescriptions   Signed Prescriptions Disp Refills  . tiZANidine (ZANAFLEX) 2 MG tablet 15 tablet 0    Sig: Take 1 tablet (2 mg total) by mouth every 8 (eight) hours as needed for muscle spasms.    Camillia Herter, NP

## 2020-04-17 NOTE — Patient Instructions (Addendum)
Continue Zanaflex for muscle spasm pain.  Referral to Neurosurgery.   Sciatica  Sciatica is pain, weakness, tingling, or loss of feeling (numbness) along the sciatic nerve. The sciatic nerve starts in the lower back and goes down the back of each leg. Sciatica usually goes away on its own or with treatment. Sometimes, sciatica may come back (recur). What are the causes? This condition happens when the sciatic nerve is pinched or has pressure put on it. This may be the result of:  A disk in between the bones of the spine bulging out too far (herniated disk).  Changes in the spinal disks that occur with aging.  A condition that affects a muscle in the butt.  Extra bone growth near the sciatic nerve.  A break (fracture) of the area between your hip bones (pelvis).  Pregnancy.  Tumor. This is rare. What increases the risk? You are more likely to develop this condition if you:  Play sports that put pressure or stress on the spine.  Have poor strength and ease of movement (flexibility).  Have had a back injury in the past.  Have had back surgery.  Sit for long periods of time.  Do activities that involve bending or lifting over and over again.  Are very overweight (obese). What are the signs or symptoms? Symptoms can vary from mild to very bad. They may include:  Any of these problems in the lower back, leg, hip, or butt: ? Mild tingling, loss of feeling, or dull aches. ? Burning sensations. ? Sharp pains.  Loss of feeling in the back of the calf or the sole of the foot.  Leg weakness.  Very bad back pain that makes it hard to move. These symptoms may get worse when you cough, sneeze, or laugh. They may also get worse when you sit or stand for long periods of time. How is this treated? This condition often gets better without any treatment. However, treatment may include:  Changing or cutting back on physical activity when you have pain.  Doing exercises and  stretching.  Putting ice or heat on the affected area.  Medicines that help: ? To relieve pain and swelling. ? To relax your muscles.  Shots (injections) of medicines that help to relieve pain, irritation, and swelling.  Surgery. Follow these instructions at home: Medicines  Take over-the-counter and prescription medicines only as told by your doctor.  Ask your doctor if the medicine prescribed to you: ? Requires you to avoid driving or using heavy machinery. ? Can cause trouble pooping (constipation). You may need to take these steps to prevent or treat trouble pooping:  Drink enough fluids to keep your pee (urine) pale yellow.  Take over-the-counter or prescription medicines.  Eat foods that are high in fiber. These include beans, whole grains, and fresh fruits and vegetables.  Limit foods that are high in fat and sugar. These include fried or sweet foods. Managing pain  If told, put ice on the affected area. ? Put ice in a plastic bag. ? Place a towel between your skin and the bag. ? Leave the ice on for 20 minutes, 2-3 times a day.  If told, put heat on the affected area. Use the heat source that your doctor tells you to use, such as a moist heat pack or a heating pad. ? Place a towel between your skin and the heat source. ? Leave the heat on for 20-30 minutes. ? Remove the heat if your skin turns  bright red. This is very important if you are unable to feel pain, heat, or cold. You may have a greater risk of getting burned.      Activity  Return to your normal activities as told by your doctor. Ask your doctor what activities are safe for you.  Avoid activities that make your symptoms worse.  Take short rests during the day. ? When you rest for a long time, do some physical activity or stretching between periods of rest. ? Avoid sitting for a long time without moving. Get up and move around at least one time each hour.  Exercise and stretch regularly, as told by  your doctor.  Do not lift anything that is heavier than 10 lb (4.5 kg) while you have symptoms of sciatica. ? Avoid lifting heavy things even when you do not have symptoms. ? Avoid lifting heavy things over and over.  When you lift objects, always lift in a way that is safe for your body. To do this, you should: ? Bend your knees. ? Keep the object close to your body. ? Avoid twisting.   General instructions  Stay at a healthy weight.  Wear comfortable shoes that support your feet. Avoid wearing high heels.  Avoid sleeping on a mattress that is too soft or too hard. You might have less pain if you sleep on a mattress that is firm enough to support your back.  Keep all follow-up visits as told by your doctor. This is important. Contact a doctor if:  You have pain that: ? Wakes you up when you are sleeping. ? Gets worse when you lie down. ? Is worse than the pain you have had in the past. ? Lasts longer than 4 weeks.  You lose weight without trying. Get help right away if:  You cannot control when you pee (urinate) or poop (have a bowel movement).  You have weakness in any of these areas and it gets worse: ? Lower back. ? The area between your hip bones. ? Butt. ? Legs.  You have redness or swelling of your back.  You have a burning feeling when you pee. Summary  Sciatica is pain, weakness, tingling, or loss of feeling (numbness) along the sciatic nerve.  This condition happens when the sciatic nerve is pinched or has pressure put on it.  Sciatica can cause pain, tingling, or loss of feeling (numbness) in the lower back, legs, hips, and butt.  Treatment often includes rest, exercise, medicines, and putting ice or heat on the affected area. This information is not intended to replace advice given to you by your health care provider. Make sure you discuss any questions you have with your health care provider. Document Revised: 04/13/2018 Document Reviewed:  04/13/2018 Elsevier Patient Education  [place].

## 2020-04-17 NOTE — Progress Notes (Signed)
[facility] 03/29/20 Sciatic nerve pain (right)  Muscle spasms in right calf  Stated pt needed MRI

## 2020-04-22 ENCOUNTER — Other Ambulatory Visit: Payer: Self-pay

## 2020-04-22 ENCOUNTER — Encounter: Payer: Self-pay | Admitting: *Deleted

## 2020-04-22 ENCOUNTER — Ambulatory Visit
Admission: EM | Admit: 2020-04-22 | Discharge: 2020-04-22 | Disposition: A | Discharge status: Home / Self Care | Payer: Medicare PPO | Attending: Emergency Medicine | Admitting: Emergency Medicine

## 2020-04-22 DIAGNOSIS — J069 Acute upper respiratory infection, unspecified: Secondary | ICD-10-CM | POA: Diagnosis not present

## 2020-04-22 MED ORDER — PREDNISONE 10 MG (21) PO TBPK: ORAL_TABLET | Freq: Every day | ORAL | 0 refills | Status: DC

## 2020-04-22 MED ORDER — FLUTICASONE PROPIONATE 50 MCG/ACT NA SUSP: 1.0000 | Freq: Every day | NASAL | 0 refills | Status: DC

## 2020-04-22 MED ORDER — CETIRIZINE HCL 10 MG PO TABS: 10.0000 mg | ORAL_TABLET | Freq: Every day | ORAL | 0 refills | Status: DC

## 2020-04-22 NOTE — ED Provider Notes (Signed)
[name]    CSN: [id number] Arrival date & time: 04/22/20  1305      History   Chief Complaint Chief Complaint  Patient presents with  . Otalgia    LT ear    HPI Kayla [name] is a 75 y.o. female  With extensive medical history as below presenting for 3-day course of bilateral maxillary sinus tenderness, left ear pain.  Denies tinnitus, change in hearing, vertigo, fever, cough.  Is not COVID vaccinated, voicing concern thereof.  No known sick contacts.  Has taking OTC decongestions without relief.  Past Medical History:  Diagnosis Date  . Anemia   . Anxiety   . Asthma   . B12 deficiency   . Bursitis   . Chronic LBP   . Chronic UTI   . Diastolic dysfunction   . Fibromyalgia   . GERD (gastroesophageal reflux disease)   . Hyperlipemia   . Hypertension   . Hypothyroid   . IBS (irritable bowel syndrome)   . Inflammatory osteoarthritis   . Kidney infection   . Psoriatic arthritis ([place])   . S/P cardiac cath 11/30/2007   normal coronaries - Dr. Burt Knack (Dr. Debara Pickett reviewed films on 01/10/2016)    Patient Active Problem List   Diagnosis Date Noted  . Dysphagia 04/17/2020  . Renal insufficiency 12/29/2018  . Cyst of left ovary 12/29/2018  . Esophageal dysmotility 12/29/2018  . 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 Acquired hypothyroidism 12/29/2018  . Chronic maxillary sinusitis 07/07/2018  . Snoring 06/01/2018  . Costochondritis 06/01/2018  . Family history of heart disease 06/01/2018  . Class 2 severe obesity due to excess calories with serious comorbidity and body mass index (BMI) of 38.0 to 38.9 in adult ([place]) 06/01/2018  . Multiple atypical skin moles 04/28/2017  . Depression, recurrent ([place]) 07/16/2016  . Adjustment disorder with mixed anxiety and depressed mood 01/15/2016  . Fe Def Anemia 12/22/2015  . Scoliosis (and kyphoscoliosis), idiopathic 12/20/2015  . Osteopenia 12/20/2015  . Fibromyalgia 08/02/2014  . GAD  (generalized anxiety disorder) 09/17/2012  . Vitamin D deficiency 01/24/2012  . B12 deficiency   . Hyperlipidemia 11/30/2007  . Coronary artery disease involving native heart without angina pectoris 11/30/2007  . Essential hypertension 01/27/2007  . Reactive airway disease that is not asthma 01/27/2007  . GERD 01/27/2007  . IBS 01/27/2007    Past Surgical History:  Procedure Laterality Date  . CESAREAN SECTION  06/10/1975  . CHOLECYSTECTOMY    . NECK SURGERY  1998  . repair broken C6 & C7      OB History   No obstetric history on file.      Home Medications    Prior to Admission medications   Medication Sig Start Date End Date Taking? Authorizing Provider  cetirizine (ZYRTEC ALLERGY) 10 MG tablet Take 1 tablet (10 mg total) by mouth daily. 04/22/20  Yes Hall-Potvin, [place], PA-C  fluticasone (FLONASE) 50 MCG/ACT nasal spray Place 1 spray into both nostrils daily. 04/22/20  Yes Hall-Potvin, [place], PA-C  predniSONE (STERAPRED UNI-PAK 21 TAB) 10 MG (21) TBPK tablet Take by mouth daily. Take steroid taper as written 04/22/20  Yes Hall-Potvin, [place], PA-C  aspirin EC 81 MG tablet Take 1 tablet (81 mg total) by mouth daily. 12/15/18   Buford Dresser, MD  Calcium-Magnesium-Zinc (CAL-MAG-ZINC PO) Take by mouth 2 (two) times daily.    [provider]  chlorthalidone (HYGROTON) 25 MG tablet TAKE 1 TABLET BY MOUTH EVERY DAY 03/27/20   Harrell Gave,  Bridgette, MD  clotrimazole (LOTRIMIN) 1 % cream Apply to affected area 2 times daily 03/16/20   Hall-Potvin, [place], PA-C  cyanocobalamin ([employer] VITAMIN B12) 1000 MCG tablet Take 1 tablet (1,000 mcg total) by mouth daily. 03/02/19   Ladell Pier, MD  diltiazem (TIAZAC) 360 MG 24 hr capsule Take 1 capsule (360 mg total) by mouth daily. 03/09/19   Buford Dresser, MD  ferrous sulfate 325 (65 FE) MG tablet ferrous sulfate 325 mg (65 mg iron) tablet  TAKE 1 TABLET BY MOUTH 2 TIMES DAILY WITH A MEAL    [provider]  levothyroxine (SYNTHROID) 112 MCG tablet Take 1 tablet (112 mcg total) by mouth daily. 07/12/19   Nicolette Bang, DO  lidocaine (LIDODERM) 5 % Place 1 patch onto the skin daily. Remove & Discard patch within 12 hours or as directed by MD 03/29/20   Horton, Alvin Critchley, DO  miconazole (MICOTIN) 2 % powder Apply topically as needed for itching. 03/16/20   Hall-Potvin, [place], PA-C  nystatin (MYCOSTATIN/NYSTOP) powder Apply 1 application topically 3 (three) times daily. 02/17/20   Nicolette Bang, DO  Omega-3 Fatty Acids (FISH OIL) 1000 MG CAPS Take by mouth. 01/15/16   [provider]  POTASSIUM CHLORIDE PO Take by mouth.    [provider]  pyridOXINE (VITAMIN B-6) 100 MG tablet Take 100 mg by mouth daily.    [provider]  tiZANidine (ZANAFLEX) 2 MG tablet Take 1 tablet (2 mg total) by mouth every 8 (eight) hours as needed for muscle spasms. 04/17/20   Camillia Herter, NP  triamcinolone (KENALOG) 0.1 % Apply 1 application topically 2 (two) times daily. 03/16/20   Hall-Potvin, [place], PA-C    Family History Family History  Problem Relation Age of Onset  . Heart attack Father 29  . Lung cancer Other        uncle, non smoker  . Leukemia Other        uncle  . Cancer Mother        oral  . Stroke Mother   . Alzheimer's disease Mother        at 9  . Cancer Maternal Aunt        breast  . Alcohol abuse Maternal Aunt   . Alcohol abuse Maternal Uncle   . Cancer Maternal Uncle        lung, stomach, oral  . Cancer Paternal Uncle        lung  . Congestive Heart Failure Maternal Grandfather   . Heart disease Maternal Grandfather   . Heart attack Paternal Grandfather   . Cancer Paternal Uncle        bone  . Brain cancer Maternal Grandmother   . Alzheimer's disease Other        maternal great grandmother   . Colon cancer Neg Hx   . Breast cancer Neg Hx   . Diabetes Neg Hx     Social History Social History   Tobacco Use  .  Smoking status: Never Smoker  . Smokeless tobacco: Never Used  Vaping Use  . Vaping Use: Never used  Substance Use Topics  . Alcohol use: No  . Drug use: No     Allergies   Ace inhibitors, Lisinopril, and Other   Review of Systems Review of Systems  Constitutional: Negative for fatigue and fever.  HENT: Positive for congestion, ear pain, postnasal drip, rhinorrhea and sinus pressure. Negative for dental problem, ear discharge, facial swelling, hearing loss, sinus  pain, sore throat, trouble swallowing and voice change.   Eyes: Negative for photophobia, pain and visual disturbance.  Respiratory: Negative for cough and shortness of breath.   Cardiovascular: Negative for chest pain and palpitations.  Gastrointestinal: Negative for diarrhea and vomiting.  Musculoskeletal: Negative for arthralgias and myalgias.  Neurological: Negative for dizziness and headaches.     Physical Exam Triage Vital Signs ED Triage Vitals  Enc Vitals Group     BP 04/22/20 1340 (!) 157/100     Pulse Rate 04/22/20 1340 89     Resp 04/22/20 1340 19     Temp 04/22/20 1340 98.3 F (36.8 C)     Temp Source 04/22/20 1340 Oral     SpO2 04/22/20 1340 95 %     Weight --      Height --      Head Circumference --      Peak Flow --      Pain Score 04/22/20 1338 8     Pain Loc --      Pain Edu? --      Excl. in [place]? --    No data found.  Updated Vital Signs BP (!) 157/100 (BP Location: Right Arm)   Pulse 89   Temp 98.3 F (36.8 C) (Oral)   Resp 19   SpO2 95%   Visual Acuity Right Eye Distance:   Left Eye Distance:   Bilateral Distance:    Right Eye Near:   Left Eye Near:    Bilateral Near:     Physical Exam Constitutional:      General: She is not in acute distress.    Appearance: She is not ill-appearing or diaphoretic.  HENT:     Head: Normocephalic and atraumatic.     Right Ear: Ear canal normal.     Left Ear: Ear canal normal.     Ears:     Comments: Mild bilateral TM retraction  without perforation.  Bony landmarks intact.  No suppurativa, hemotympanum    Nose:     Comments: Bilateral turbinate edema with pink mucosa.  No mucous plug.  Positive maxillary sinus tenderness bilaterally    Mouth/Throat:     Mouth: Mucous membranes are moist.     Pharynx: Oropharynx is clear. No oropharyngeal exudate or posterior oropharyngeal erythema.  Eyes:     General: No scleral icterus.    Conjunctiva/sclera: Conjunctivae normal.     Pupils: Pupils are equal, round, and reactive to light.  Neck:     Comments: Trachea midline, negative JVD Cardiovascular:     Rate and Rhythm: Normal rate and regular rhythm.     Heart sounds: No murmur heard. No gallop.   Pulmonary:     Effort: Pulmonary effort is normal. No respiratory distress.     Breath sounds: No wheezing, rhonchi or rales.  Musculoskeletal:     Cervical back: Neck supple. No tenderness.  Lymphadenopathy:     Cervical: No cervical adenopathy.  Skin:    Capillary Refill: Capillary refill takes less than 2 seconds.     Coloration: Skin is not jaundiced or pale.     Findings: No rash.  Neurological:     General: No focal deficit present.     Mental Status: She is alert and oriented to person, place, and time.      UC Treatments / Results  Labs (all labs ordered are listed, but only abnormal results are displayed) Labs Reviewed  NOVEL CORONAVIRUS, NAA    EKG  Radiology No results found.  Procedures Procedures (including critical care time)  Medications Ordered in UC Medications - No data to display  Initial Impression / Assessment and Plan / UC Course  I have reviewed the triage vital signs and the nursing notes.  Pertinent labs & imaging results that were available during my care of the patient were reviewed by me and considered in my medical decision making (see chart for details).     Patient afebrile, nontoxic, with SpO2 95%.  Covid PCR pending.  Patient to quarantine until results are back.   We will treat supportively as outlined below, avoid OTC decongestions given HTN.  Return precautions discussed, patient verbalized understanding and is agreeable to plan. Final Clinical Impressions(s) / UC Diagnoses   Final diagnoses:  Viral URI     Discharge Instructions     Prednisone as instructed: 6-5-4-3-2-1    ED Prescriptions    Medication Sig Dispense Auth. Provider   cetirizine (ZYRTEC ALLERGY) 10 MG tablet Take 1 tablet (10 mg total) by mouth daily. 30 tablet Hall-Potvin, [place], PA-C   fluticasone (FLONASE) 50 MCG/ACT nasal spray Place 1 spray into both nostrils daily. 16 g Hall-Potvin, [place], PA-C   predniSONE (STERAPRED UNI-PAK 21 TAB) 10 MG (21) TBPK tablet Take by mouth daily. Take steroid taper as written 21 tablet Hall-Potvin, [place], PA-C     PDMP not reviewed this encounter.   Hall-Potvin, [place], [place] 04/22/20 1409

## 2020-04-22 NOTE — ED Triage Notes (Signed)
Pt reports Lt ear pain and possible sinus infection.

## 2020-04-22 NOTE — Discharge Instructions (Addendum)
Prednisone as instructed: 6-5-4-3-2-1

## 2020-04-25 LAB — NOVEL CORONAVIRUS, NAA: SARS-CoV-2, NAA: NOT DETECTED

## 2020-05-02 ENCOUNTER — Encounter: Payer: Self-pay | Admitting: Cardiology

## 2020-05-03 ENCOUNTER — Telehealth (INDEPENDENT_AMBULATORY_CARE_PROVIDER_SITE_OTHER): Payer: Medicare PPO | Admitting: Physician Assistant

## 2020-05-03 ENCOUNTER — Encounter: Payer: Self-pay | Admitting: *Deleted

## 2020-05-03 ENCOUNTER — Other Ambulatory Visit: Payer: Self-pay

## 2020-05-03 ENCOUNTER — Encounter: Payer: Self-pay | Admitting: Physician Assistant

## 2020-05-03 VITALS — BP 130/87 | HR 85 | Temp 97.3°F | Resp 18 | Ht 64.0 in

## 2020-05-03 DIAGNOSIS — E876 Hypokalemia: Secondary | ICD-10-CM | POA: Diagnosis not present

## 2020-05-03 DIAGNOSIS — E039 Hypothyroidism, unspecified: Secondary | ICD-10-CM

## 2020-05-03 DIAGNOSIS — R5383 Other fatigue: Secondary | ICD-10-CM | POA: Diagnosis not present

## 2020-05-03 NOTE — Patient Instructions (Addendum)
Your urinalysis did not show any concerns for urinary tract infection.  I encourage you to work on increasing your hydration, decreasing your stress and get plenty of rest.  We will call you with your lab results.  Kennieth Rad, PA-C Physician Assistant [facility] [email or web address]    Fatigue If you have fatigue, you feel tired all the time and have a lack of energy or a lack of motivation. Fatigue may make it difficult to start or complete tasks because of exhaustion. In general, occasional or mild fatigue is often a normal response to activity or life. However, long-lasting (chronic) or extreme fatigue may be a symptom of a medical condition. Follow these instructions at home: General instructions  Watch your fatigue for any changes.  Go to bed and get up at the same time every day.  Avoid fatigue by pacing yourself during the day and getting enough sleep at night.  Maintain a healthy weight. Medicines  Take over-the-counter and prescription medicines only as told by your health care provider.  Take a multivitamin, if told by your health care provider.  Do not use herbal or dietary supplements unless they are approved by your health care provider. Activity  Exercise regularly, as told by your health care provider.  Use or practice techniques to help you relax, such as yoga, tai chi, meditation, or massage therapy.   Eating and drinking  Avoid heavy meals in the evening.  Eat a well-balanced diet, which includes lean proteins, whole grains, plenty of fruits and vegetables, and low-fat dairy products.  Avoid consuming too much caffeine.  Avoid the use of alcohol.  Drink enough fluid to keep your urine pale yellow.   Lifestyle  Change situations that cause you stress. Try to keep your work and personal schedule in balance.  Do not use any products that contain nicotine or tobacco, such as cigarettes and  e-cigarettes. If you need help quitting, ask your health care provider.  Do not use drugs. Contact a health care provider if:  Your fatigue does not get better.  You have a fever.  You suddenly lose or gain weight.  You have headaches.  You have trouble falling asleep or sleeping through the night.  You feel angry, guilty, anxious, or sad.  You are unable to have a bowel movement (constipation).  Your skin is dry.  You have swelling in your legs or another part of your body. Get help right away if:  You feel confused.  Your vision is blurry.  You feel faint or you pass out.  You have a severe headache.  You have severe pain in your abdomen, your back, or the area between your waist and hips (pelvis).  You have chest pain, shortness of breath, or an irregular or fast heartbeat.  You are unable to urinate, or you urinate less than normal.  You have abnormal bleeding, such as bleeding from the rectum, vagina, nose, lungs, or nipples.  You vomit blood.  You have thoughts about hurting yourself or others. If you ever feel like you may hurt yourself or others, or have thoughts about taking your own life, get help right away. You can go to your nearest emergency department or call:  Your local emergency services (911 in the [place]  A suicide crisis helpline, such as the [place] at (351)182-2934. This is open 24 hours a day. Summary  If you have fatigue, you feel tired all the time and have a lack of  energy or a lack of motivation.  Fatigue may make it difficult to start or complete tasks because of exhaustion.  Long-lasting (chronic) or extreme fatigue may be a symptom of a medical condition.  Exercise regularly, as told by your health care provider.  Change situations that cause you stress. Try to keep your work and personal schedule in balance. This information is not intended to replace advice given to you by your health care  provider. Make sure you discuss any questions you have with your health care provider. Document Revised: 10/14/2018 Document Reviewed: 12/18/2016 Elsevier Patient Education  2021 [employer].

## 2020-05-03 NOTE — Progress Notes (Signed)
Patient verified DOB Patient complains of tachycardia and fatigue began 2 weeks ago. Patient has been completing prednisone from a Viral URI on 04/22/20. Patient reports 2 years ago experiecing a similar episode when her TSH was severely abnormal. Patient shares she is not sleeping well due to "vibrating" of her body.

## 2020-05-03 NOTE — Progress Notes (Signed)
Established Patient Office Visit  Subjective:  Patient ID: Kayla [name], female    DOB: 10/27/1945  Age: 75 y.o. MRN: [id number]  CC:  Chief Complaint  Patient presents with  . Fatigue  . Tachycardia    HPI Kayla [name] reports that she has been feeling very fatigued, states that she has a sensation that her body is vibrating, states it feels like it is moving while she is sitting still.  Reports that she feels warm, states that she feels that her body is inflamed.  Denies any measured fever.  Reports that she was recently treated for an upper respiratory infection with a steroid taper.  Reports that she finished that approximately 5 days ago.  Reports that she has been checking her O2 levels and they are within normal limits, states that her pulse has been within normal limits.  Reports that she has been anxious, states that this is due to for her high stress life.  Reports that she is drinking approximately 32 ounces of water    Past Medical History:  Diagnosis Date  . Anemia   . Anxiety   . Asthma   . B12 deficiency   . Bursitis   . Chronic LBP   . Chronic UTI   . Diastolic dysfunction   . Fibromyalgia   . GERD (gastroesophageal reflux disease)   . Hyperlipemia   . Hypertension   . Hypothyroid   . IBS (irritable bowel syndrome)   . Inflammatory osteoarthritis   . Kidney infection   . Psoriatic arthritis ([place])   . S/P cardiac cath 11/30/2007   normal coronaries - Dr. Burt Knack (Dr. Debara Pickett reviewed films on 01/10/2016)    Past Surgical History:  Procedure Laterality Date  . CESAREAN SECTION  06/10/1975  . CHOLECYSTECTOMY    . NECK SURGERY  1998  . repair broken C6 & C7      Family History  Problem Relation Age of Onset  . Heart attack Father 91  . Lung cancer Other        uncle, non smoker  . Leukemia Other        uncle  . Cancer Mother        oral  . Stroke Mother   . Alzheimer's disease Mother        at 28  . Cancer Maternal Aunt        breast  . Alcohol  abuse Maternal Aunt   . Alcohol abuse Maternal Uncle   . Cancer Maternal Uncle        lung, stomach, oral  . Cancer Paternal Uncle        lung  . Congestive Heart Failure Maternal Grandfather   . Heart disease Maternal Grandfather   . Heart attack Paternal Grandfather   . Cancer Paternal Uncle        bone  . Brain cancer Maternal Grandmother   . Alzheimer's disease Other        maternal great grandmother   . Colon cancer Neg Hx   . Breast cancer Neg Hx   . Diabetes Neg Hx     Social History   Socioeconomic History  . Marital status: Widowed    Spouse name: Not on file  . Number of children: 2  . Years of education: Not on file  . Highest education level: Bachelor's degree (e.g., BA, AB, BS)  Occupational History  . Not on file  Tobacco Use  . Smoking status: Never Smoker  . Smokeless  tobacco: Never Used  Vaping Use  . Vaping Use: Never used  Substance and Sexual Activity  . Alcohol use: No  . Drug use: No  . Sexual activity: Not on file  Other Topics Concern  . Not on file  Social History Narrative   Lost husband 2009, lost mother 2016      Lives at home (son & 3 grandchildren live with her)   Caffeine: 10-12 oz daily   Social Determinants of Health   Financial Resource Strain: Not on file  Food Insecurity: Not on file  Transportation Needs: Not on file  Physical Activity: Not on file  Stress: Not on file  Social Connections: Not on file  Intimate Partner Violence: Not on file    Outpatient Medications Prior to Visit  Medication Sig Dispense Refill  . aspirin EC 81 MG tablet Take 1 tablet (81 mg total) by mouth daily.    . Calcium-Magnesium-Zinc (CAL-MAG-ZINC PO) Take by mouth 2 (two) times daily.    . cetirizine (ZYRTEC ALLERGY) 10 MG tablet Take 1 tablet (10 mg total) by mouth daily. 30 tablet 0  . chlorthalidone (HYGROTON) 25 MG tablet TAKE 1 TABLET BY MOUTH EVERY DAY 90 tablet 3  . clotrimazole (LOTRIMIN) 1 % cream Apply to affected area 2 times  daily 15 g 0  . cyanocobalamin ([facility] VITAMIN B12) 1000 MCG tablet Take 1 tablet (1,000 mcg total) by mouth daily. 90 tablet 1  . diltiazem (TIAZAC) 360 MG 24 hr capsule Take 1 capsule (360 mg total) by mouth daily. 90 capsule 1  . ferrous sulfate 325 (65 FE) MG tablet ferrous sulfate 325 mg (65 mg iron) tablet  TAKE 1 TABLET BY MOUTH 2 TIMES DAILY WITH A MEAL    . fluticasone (FLONASE) 50 MCG/ACT nasal spray Place 1 spray into both nostrils daily. 16 g 0  . levothyroxine (SYNTHROID) 112 MCG tablet Take 1 tablet (112 mcg total) by mouth daily. 90 tablet 3  . lidocaine (LIDODERM) 5 % Place 1 patch onto the skin daily. Remove & Discard patch within 12 hours or as directed by MD 30 patch 0  . miconazole (MICOTIN) 2 % powder Apply topically as needed for itching. 70 g 0  . nystatin (MYCOSTATIN/NYSTOP) powder Apply 1 application topically 3 (three) times daily. 60 g 0  . Omega-3 Fatty Acids (FISH OIL) 1000 MG CAPS Take by mouth.    . pyridOXINE (VITAMIN B-6) 100 MG tablet Take 100 mg by mouth daily.    Marland Kitchen tiZANidine (ZANAFLEX) 2 MG tablet Take 1 tablet (2 mg total) by mouth every 8 (eight) hours as needed for muscle spasms. 15 tablet 0  . triamcinolone (KENALOG) 0.1 % Apply 1 application topically 2 (two) times daily. 45 each 0  . POTASSIUM CHLORIDE PO Take by mouth. (Patient not taking: Reported on 05/03/2020)    . predniSONE (STERAPRED UNI-PAK 21 TAB) 10 MG (21) TBPK tablet Take by mouth daily. Take steroid taper as written 21 tablet 0   No facility-administered medications prior to visit.    Allergies  Allergen Reactions  . Ace Inhibitors Anaphylaxis    REACTION: glossal edema  . Lisinopril Anaphylaxis  . Other Nausea Only    ROS Review of Systems  Constitutional: Positive for fatigue. Negative for fever.  HENT: Negative.   Eyes: Negative.   Respiratory: Negative.   Cardiovascular: Positive for palpitations. Negative for chest pain.  Gastrointestinal: Negative.   Endocrine: Negative.    Genitourinary: Negative.   Musculoskeletal: Negative.  Skin: Negative.   Allergic/Immunologic: Negative.   Neurological: Positive for tremors.  Hematological: Negative.   Psychiatric/Behavioral: Negative for self-injury, sleep disturbance and suicidal ideas. The patient is nervous/anxious.       Objective:    Physical Exam Vitals and nursing note reviewed.  Constitutional:      General: She is not in acute distress.    Appearance: Normal appearance. She is not ill-appearing.  HENT:     Head: Normocephalic.     Right Ear: External ear normal.     Left Ear: External ear normal.     Nose: Nose normal.     Mouth/Throat:     Mouth: Mucous membranes are moist.     Pharynx: Oropharynx is clear.  Eyes:     Extraocular Movements: Extraocular movements intact.     Conjunctiva/sclera: Conjunctivae normal.     Pupils: Pupils are equal, round, and reactive to light.  Cardiovascular:     Rate and Rhythm: Normal rate and regular rhythm.     Pulses: Normal pulses.     Heart sounds: Normal heart sounds.  Pulmonary:     Effort: Pulmonary effort is normal.     Breath sounds: Normal breath sounds.  Musculoskeletal:        General: Normal range of motion.     Cervical back: Normal range of motion and neck supple.  Skin:    General: Skin is warm and dry.  Neurological:     General: No focal deficit present.     Mental Status: She is alert and oriented to person, place, and time.  Psychiatric:        Mood and Affect: Mood normal.        Behavior: Behavior normal.        Thought Content: Thought content normal.        Judgment: Judgment normal.     BP 130/87 (BP Location: Right Arm, Patient Position: Sitting, Cuff Size: Large)   Pulse 85   Temp (!) 97.3 F (36.3 C) (Oral)   Resp 18   Ht 5\' 4"  (1.626 m)   SpO2 96%   BMI 36.46 kg/m  Wt Readings from Last 3 Encounters:  04/17/20 212 lb 6.4 oz (96.3 kg)  02/17/20 213 lb (96.6 kg)  11/10/19 215 lb (97.5 kg)     Health  Maintenance Due  Topic Date Due  . COVID-19 Vaccine (1) Never done  . MAMMOGRAM  04/08/2006  . DEXA SCAN  Never done  . PNA vac Low Risk Adult (1 of 2 - PCV13) 05/23/2010  . COLONOSCOPY (Pts 45-35yrs Insurance coverage will need to be confirmed)  05/19/2012  . TETANUS/TDAP  04/08/2013  . INFLUENZA VACCINE  Never done    There are no preventive care reminders to display for this patient.  Lab Results  Component Value Date   TSH 7.940 (H) 05/03/2020   Lab Results  Component Value Date   WBC 10.3 05/03/2020   HGB 14.7 05/03/2020   HCT 42.4 05/03/2020   MCV 95 05/03/2020   PLT 325 05/03/2020   Lab Results  Component Value Date   NA 142 05/03/2020   K 3.4 (L) 05/03/2020   CO2 24 03/29/2020   GLUCOSE 123 (H) 05/03/2020   BUN 10 05/03/2020   CREATININE 1.00 05/03/2020   BILITOT 0.3 05/03/2020   ALKPHOS 73 05/03/2020   AST 16 05/03/2020   ALT 17 03/29/2020   PROT 6.2 05/03/2020   ALBUMIN 3.6 (L) 05/03/2020   CALCIUM 9.3 05/03/2020  ANIONGAP 12 03/29/2020   GFR 53.94 (L) 01/16/2015   Lab Results  Component Value Date   CHOL 166 11/02/2018   Lab Results  Component Value Date   HDL 56 11/02/2018   Lab Results  Component Value Date   LDLCALC 84 11/02/2018   Lab Results  Component Value Date   TRIG 132 11/02/2018   Lab Results  Component Value Date   CHOLHDL 3.0 11/02/2018   Lab Results  Component Value Date   HGBA1C 5.6 11/02/2018      Assessment & Plan:   Problem List Items Addressed This Visit      Endocrine   Acquired hypothyroidism   Relevant Orders   TSH (Completed)    Other Visit Diagnoses    Fatigue, unspecified type    -  Primary   Relevant Orders   CBC with Differential/Platelet (Completed)   Hypokalemia       Relevant Orders   Comp. Metabolic Panel (12) (Completed)    1. Fatigue, unspecified type UA within normal limits.  Patient encouraged to increase water intake, work on reducing stress, red flags given for prompt  reevaluation - CBC with Differential/Platelet  2. Hypokalemia  - Comp. Metabolic Panel (12)  3. Acquired hypothyroidism  - TSH   No orders of the defined types were placed in this encounter.   I have reviewed the patient's medical history (PMH, PSH, Social History, Family History, Medications, and allergies) , and have been updated if relevant. I spent 30 minutes reviewing chart and  face to face time with patient.    Follow-up: No follow-ups on file.    Loraine Grip Eliseo Withers, PA-C

## 2020-05-04 ENCOUNTER — Encounter: Payer: Self-pay | Admitting: Physician Assistant

## 2020-05-04 ENCOUNTER — Other Ambulatory Visit: Payer: Self-pay | Admitting: Physician Assistant

## 2020-05-04 DIAGNOSIS — E876 Hypokalemia: Secondary | ICD-10-CM | POA: Insufficient documentation

## 2020-05-04 DIAGNOSIS — E039 Hypothyroidism, unspecified: Secondary | ICD-10-CM

## 2020-05-04 LAB — COMP. METABOLIC PANEL (12)
AST: 16 IU/L (ref 0–40)
Albumin/Globulin Ratio: 1.4 (ref 1.2–2.2)
Albumin: 3.6 g/dL — ABNORMAL LOW (ref 3.7–4.7)
Alkaline Phosphatase: 73 IU/L (ref 44–121)
BUN/Creatinine Ratio: 10 — ABNORMAL LOW (ref 12–28)
BUN: 10 mg/dL (ref 8–27)
Bilirubin Total: 0.3 mg/dL (ref 0.0–1.2)
Calcium: 9.3 mg/dL (ref 8.7–10.3)
Chloride: 101 mmol/L (ref 96–106)
Creatinine, Ser: 1 mg/dL (ref 0.57–1.00)
GFR calc Af Amer: 64 mL/min/{1.73_m2} (ref >59)
GFR calc non Af Amer: 56 mL/min/{1.73_m2} — ABNORMAL LOW (ref >59)
Globulin, Total: 2.6 g/dL (ref 1.5–4.5)
Glucose: 123 mg/dL — ABNORMAL HIGH (ref 65–99)
Potassium: 3.4 mmol/L — ABNORMAL LOW (ref 3.5–5.2)
Sodium: 142 mmol/L (ref 134–144)
Total Protein: 6.2 g/dL (ref 6.0–8.5)

## 2020-05-04 LAB — CBC WITH DIFFERENTIAL/PLATELET
Basophils Absolute: 0.1 10*3/uL (ref 0.0–0.2)
Basos: 1 %
EOS (ABSOLUTE): 0.2 10*3/uL (ref 0.0–0.4)
Eos: 2 %
Hematocrit: 42.4 % (ref 34.0–46.6)
Hemoglobin: 14.7 g/dL (ref 11.1–15.9)
Immature Grans (Abs): 0.1 10*3/uL (ref 0.0–0.1)
Immature Granulocytes: 1 %
Lymphocytes Absolute: 2 10*3/uL (ref 0.7–3.1)
Lymphs: 20 %
MCH: 32.9 pg (ref 26.6–33.0)
MCHC: 34.7 g/dL (ref 31.5–35.7)
MCV: 95 fL (ref 79–97)
Monocytes Absolute: 1 10*3/uL — ABNORMAL HIGH (ref 0.1–0.9)
Monocytes: 9 %
Neutrophils Absolute: 7.1 10*3/uL — ABNORMAL HIGH (ref 1.4–7.0)
Neutrophils: 67 %
Platelets: 325 10*3/uL (ref 150–450)
RBC: 4.47 x10E6/uL (ref 3.77–5.28)
RDW: 12.6 % (ref 11.7–15.4)
WBC: 10.3 10*3/uL (ref 3.4–10.8)

## 2020-05-04 LAB — POCT URINALYSIS DIP (CLINITEK)
Bilirubin, UA: NEGATIVE
Blood, UA: NEGATIVE
Glucose, UA: NEGATIVE mg/dL
Ketones, POC UA: NEGATIVE mg/dL
Leukocytes, UA: NEGATIVE
Nitrite, UA: NEGATIVE
POC PROTEIN,UA: NEGATIVE
Spec Grav, UA: 1.02 (ref 1.010–1.025)
Urobilinogen, UA: 0.2 E.U./dL
pH, UA: 6.5 (ref 5.0–8.0)

## 2020-05-04 LAB — TSH: TSH: 7.94 u[IU]/mL — ABNORMAL HIGH (ref 0.450–4.500)

## 2020-05-04 MED ORDER — LEVOTHYROXINE SODIUM 125 MCG PO TABS: 125.0000 ug | ORAL_TABLET | Freq: Every day | ORAL | 1 refills | Status: DC

## 2020-05-04 NOTE — Addendum Note (Signed)
Addended by: Trecia Rogers on: 05/04/2020 09:30 AM   Modules accepted: Orders

## 2020-05-07 ENCOUNTER — Telehealth: Payer: Self-pay | Admitting: *Deleted

## 2020-05-07 NOTE — Telephone Encounter (Signed)
-----   Message from Kennieth Rad, [place] sent at 05/04/2020  9:29 AM EST ----- Please call patient and let her know that her potassium is slightly low, but much improved, her kidney function is stable, she does show signs of dehydration, her liver function is within normal limits, she does not show signs of anemia.  Her thyroid levels are elevated, her thyroid medication needs a slight increase.  I am also restarting a referral for her to be evaluated by endocrinology.  She needs to have thyroid levels rechecked in 6 weeks

## 2020-05-07 NOTE — Telephone Encounter (Signed)
Patient verified DOB Patient is aware of potassium improving though slightly low. Patient is aware of labs showing dehydration. Patient is aware of labs showing elevated TSH levels and needing to pick up increased medication. Patient aware of an endo referral being restarted and recheck of TSH in 6 weeks.

## 2020-05-09 ENCOUNTER — Other Ambulatory Visit: Payer: Self-pay

## 2020-05-09 ENCOUNTER — Encounter: Payer: Self-pay | Admitting: Physician Assistant

## 2020-05-09 ENCOUNTER — Telehealth (INDEPENDENT_AMBULATORY_CARE_PROVIDER_SITE_OTHER): Payer: Medicare PPO | Admitting: Physician Assistant

## 2020-05-09 DIAGNOSIS — U071 COVID-19: Secondary | ICD-10-CM

## 2020-05-09 MED ORDER — GUAIFENESIN ER 600 MG PO TB12: 600.0000 mg | ORAL_TABLET | Freq: Two times a day (BID) | ORAL | 2 refills | Status: DC

## 2020-05-09 MED ORDER — PREDNISONE 20 MG PO TABS: ORAL_TABLET | ORAL | 0 refills | Status: DC

## 2020-05-09 NOTE — Progress Notes (Signed)
Patient complains of increased fatigue, cough and runny nose with yellow sputum.

## 2020-05-09 NOTE — Patient Instructions (Signed)
You will take prednisone taper, this was sent to your pharmacy.  I also sent Mucinex to help you with your cough and congestion.  Make sure you stay very well-hydrated.  We will follow-up with you with a virtual visit on Thursday at 4:10 for further evaluation.  I hope that you feel better soon, please let us know if there is anything else we can do in the meantime.  Kennieth Rad, PA-C Physician Assistant [facility] [email or web address]   10 Things You Can Do to Manage Your COVID-19 Symptoms at Home If you have possible or confirmed COVID-19: 1. Stay home except to get medical care. 2. Monitor your symptoms carefully. If your symptoms get worse, call your healthcare provider immediately. 3. Get rest and stay hydrated. 4. If you have a medical appointment, call the healthcare provider ahead of time and tell them that you have or may have COVID-19. 5. For medical emergencies, call 911 and notify the dispatch personnel that you have or may have COVID-19. 6. Cover your cough and sneezes with a tissue or use the inside of your elbow. 7. Wash your hands often with soap and water for at least 20 seconds or clean your hands with an alcohol-based hand sanitizer that contains at least 60% alcohol. 8. As much as possible, stay in a specific room and away from other people in your home. Also, you should use a separate bathroom, if available. If you need to be around other people in or outside of the home, wear a mask. 9. Avoid sharing personal items with other people in your household, like dishes, towels, and bedding. 10. Clean all surfaces that are touched often, like counters, tabletops, and doorknobs. Use household cleaning sprays or wipes according to the label instructions. [email or web address] 10/22/2019 This information is not intended to replace advice given to you by your health care provider. Make sure you discuss any questions you have  with your health care provider. Document Revised: 02/07/2020 Document Reviewed: 02/07/2020 Elsevier Patient Education  [place] [facility] Monitoring Name: Kayla [name]  Location: [address] [place] [address]   Infection Prevention Recommendations for Individuals Confirmed to have, or Being Evaluated for, 2019 Novel Coronavirus (COVID-19) Infection Who Receive Care at Home  Individuals who are confirmed to have, or are being evaluated for, COVID-19 should follow the prevention steps below until a healthcare provider or local or state health department says they can return to normal activities.  Stay home except to get medical care You should restrict activities outside your home, except for getting medical care. Do not go to work, school, or public areas, and do not use public transportation or taxis.  Call ahead before visiting your doctor Before your medical appointment, call the healthcare provider and tell them that you have, or are being evaluated for, COVID-19 infection. This will help the healthcare provider's office take steps to keep other people from getting infected. Ask your healthcare provider to call the local or state health department.  Monitor your symptoms Seek prompt medical attention if your illness is worsening (e.g., difficulty breathing). Before going to your medical appointment, call the healthcare provider and tell them that you have, or are being evaluated for, COVID-19 infection. Ask your healthcare provider to call the local or state health department.  Wear a facemask You should wear a facemask that covers your nose and mouth when you are in the same room with other people and when you  visit a healthcare provider. People who live with or visit you should also wear a facemask while they are in the same room with you.  Separate yourself from other people in your home As much as possible, you should stay in a different room from  other people in your home. Also, you should use a separate bathroom, if available.  Avoid sharing household items You should not share dishes, drinking glasses, cups, eating utensils, towels, bedding, or other items with other people in your home. After using these items, you should wash them thoroughly with soap and water.  Cover your coughs and sneezes Cover your mouth and nose with a tissue when you cough or sneeze, or you can cough or sneeze into your sleeve. Throw used tissues in a lined trash can, and immediately wash your hands with soap and water for at least 20 seconds or use an alcohol-based hand rub.  Wash your [facility] your hands often and thoroughly with soap and water for at least 20 seconds. You can use an alcohol-based hand sanitizer if soap and water are not available and if your hands are not visibly dirty. Avoid touching your eyes, nose, and mouth with unwashed hands.   Prevention Steps for Caregivers and Household Members of Individuals Confirmed to have, or Being Evaluated for, COVID-19 Infection Being Cared for in the Home  If you live with, or provide care at home for, a person confirmed to have, or being evaluated for, COVID-19 infection please follow these guidelines to prevent infection:  Follow healthcare provider's instructions Make sure that you understand and can help the patient follow any healthcare provider instructions for all care.  Provide for the patient's basic needs You should help the patient with basic needs in the home and provide support for getting groceries, prescriptions, and other personal needs.  Monitor the patient's symptoms If they are getting sicker, call his or her medical provider and tell them that the patient has, or is being evaluated for, COVID-19 infection. This will help the healthcare provider's office take steps to keep other people from getting infected. Ask the healthcare provider to call the local or state health  department.  Limit the number of people who have contact with the patient  If possible, have only one caregiver for the patient.  Other household members should stay in another home or place of residence. If this is not possible, they should stay  in another room, or be separated from the patient as much as possible. Use a separate bathroom, if available.  Restrict visitors who do not have an essential need to be in the home.  Keep older adults, very young children, and other sick people away from the patient Keep older adults, very young children, and those who have compromised immune systems or chronic health conditions away from the patient. This includes people with chronic heart, lung, or kidney conditions, diabetes, and cancer.  Ensure good ventilation Make sure that shared spaces in the home have good air flow, such as from an air conditioner or an opened window, weather permitting.  Wash your hands often  Wash your hands often and thoroughly with soap and water for at least 20 seconds. You can use an alcohol based hand sanitizer if soap and water are not available and if your hands are not visibly dirty.  Avoid touching your eyes, nose, and mouth with unwashed hands.  Use disposable paper towels to dry your hands. If not available, use dedicated cloth towels and  replace them when they become wet.  Wear a facemask and gloves  Wear a disposable facemask at all times in the room and gloves when you touch or have contact with the patient's blood, body fluids, and/or secretions or excretions, such as sweat, saliva, sputum, nasal mucus, vomit, urine, or feces.  Ensure the mask fits over your nose and mouth tightly, and do not touch it during use.  Throw out disposable facemasks and gloves after using them. Do not reuse.  Wash your hands immediately after removing your facemask and gloves.  If your personal clothing becomes contaminated, carefully remove clothing and launder. Wash  your hands after handling contaminated clothing.  Place all used disposable facemasks, gloves, and other waste in a lined container before disposing them with other household waste.  Remove gloves and wash your hands immediately after handling these items.  Do not share dishes, glasses, or other household items with the patient  Avoid sharing household items. You should not share dishes, drinking glasses, cups, eating utensils, towels, bedding, or other items with a patient who is confirmed to have, or being evaluated for, COVID-19 infection.  After the person uses these items, you should wash them thoroughly with soap and water.  Wash laundry thoroughly  Immediately remove and wash clothes or bedding that have blood, body fluids, and/or secretions or excretions, such as sweat, saliva, sputum, nasal mucus, vomit, urine, or feces, on them.  Wear gloves when handling laundry from the patient.  Read and follow directions on labels of laundry or clothing items and detergent. In general, wash and dry with the warmest temperatures recommended on the label.  Clean all areas the individual has used often  Clean all touchable surfaces, such as counters, tabletops, doorknobs, bathroom fixtures, toilets, phones, keyboards, tablets, and bedside tables, every day. Also, clean any surfaces that may have blood, body fluids, and/or secretions or excretions on them.  Wear gloves when cleaning surfaces the patient has come in contact with.  Use a diluted bleach solution (e.g., dilute bleach with 1 part bleach and 10 parts water) or a household disinfectant with a label that says EPA-registered for coronaviruses. To make a bleach solution at home, add 1 tablespoon of bleach to 1 quart (4 cups) of water. For a larger supply, add  cup of bleach to 1 gallon (16 cups) of water.  Read labels of cleaning products and follow recommendations provided on product labels. Labels contain instructions for safe and  effective use of the cleaning product including precautions you should take when applying the product, such as wearing gloves or eye protection and making sure you have good ventilation during use of the product.  Remove gloves and wash hands immediately after cleaning.  Monitor yourself for signs and symptoms of illness Caregivers and household members are considered close contacts, should monitor their health, and will be asked to limit movement outside of the home to the extent possible. Follow the monitoring steps for close contacts listed on the symptom monitoring form.   ? If you have additional questions, contact your local health department or call the epidemiologist on call at [phone] (available 24/7). ? This guidance is subject to change. For the most up-to-date guidance from [facility], please refer to their website: [email or web address]

## 2020-05-09 NOTE — Progress Notes (Addendum)
Established Patient Office Visit  Subjective:  Patient ID: Kayla [name], female    DOB: [birth year]  Age: 75 y.o. MRN: [id number]  CC:  Chief Complaint  Patient presents with  . Covid Exposure   Virtual Visit via Telephone Note  I connected with [name] on 05/09/20 at  3:10 PM EST by telephone and verified that I am speaking with the correct person using two identifiers.  Location: Patient: Home Provider: Primary Care at [facility]   I discussed the limitations, risks, security and privacy concerns of performing an evaluation and management service by telephone and the availability of in person appointments. I also discussed with the patient that there may be a patient responsible charge related to this service. The patient expressed understanding and agreed to proceed.   History of Present Illness: Reports that she started having a productive cough with yellow sputum, a runny nose, chills, headaches, unmeasured fever, body aches, and diarrhea on Friday, May 06, 2020.  Reports low appetite, is staying hydrated.  Reports that she has been taking over-the-counter cold and flu medication with little relief.  States unknown exposure to Covid, endorses that her son is sick with similar symptoms.  Denies any previous COVID vaccines    Observations/Objective: Medical history and current medications reviewed, no physical exam completed  Past Medical History:  Diagnosis Date  . Anemia   . Anxiety   . Asthma   . B12 deficiency   . Bursitis   . Chronic LBP   . Chronic UTI   . Diastolic dysfunction   . Fibromyalgia   . GERD (gastroesophageal reflux disease)   . Hyperlipemia   . Hypertension   . Hypothyroid   . IBS (irritable bowel syndrome)   . Inflammatory osteoarthritis   . Kidney infection   . Psoriatic arthritis ([place])   . S/P cardiac cath 11/30/2007   normal coronaries - Dr. Burt Knack (Dr. Debara Pickett reviewed films on 01/10/2016)    Past Surgical History:  Procedure Laterality  Date  . CESAREAN SECTION  06/10/1975  . CHOLECYSTECTOMY    . NECK SURGERY  1998  . repair broken C6 & C7      Family History  Problem Relation Age of Onset  . Heart attack Father 16  . Lung cancer Other        uncle, non smoker  . Leukemia Other        uncle  . Cancer Mother        oral  . Stroke Mother   . Alzheimer's disease Mother        at 38  . Cancer Maternal Aunt        breast  . Alcohol abuse Maternal Aunt   . Alcohol abuse Maternal Uncle   . Cancer Maternal Uncle        lung, stomach, oral  . Cancer Paternal Uncle        lung  . Congestive Heart Failure Maternal Grandfather   . Heart disease Maternal Grandfather   . Heart attack Paternal Grandfather   . Cancer Paternal Uncle        bone  . Brain cancer Maternal Grandmother   . Alzheimer's disease Other        maternal great grandmother   . Colon cancer Neg Hx   . Breast cancer Neg Hx   . Diabetes Neg Hx     Social History   Socioeconomic History  . Marital status: Widowed    Spouse name: Not on  file  . Number of children: 2  . Years of education: Not on file  . Highest education level: Bachelor's degree (e.g., BA, AB, BS)  Occupational History  . Not on file  Tobacco Use  . Smoking status: Never Smoker  . Smokeless tobacco: Never Used  Vaping Use  . Vaping Use: Never used  Substance and Sexual Activity  . Alcohol use: No  . Drug use: No  . Sexual activity: Not on file  Other Topics Concern  . Not on file  Social History Narrative   Lost husband 2009, lost mother 2016      Lives at home (son & 3 grandchildren live with her)   Caffeine: 10-12 oz daily   Social Determinants of Health   Financial Resource Strain: Not on file  Food Insecurity: Not on file  Transportation Needs: Not on file  Physical Activity: Not on file  Stress: Not on file  Social Connections: Not on file  Intimate Partner Violence: Not on file    Outpatient Medications Prior to Visit  Medication Sig Dispense Refill   . aspirin EC 81 MG tablet Take 1 tablet (81 mg total) by mouth daily.    . Calcium-Magnesium-Zinc (CAL-MAG-ZINC PO) Take by mouth 2 (two) times daily.    . cetirizine (ZYRTEC ALLERGY) 10 MG tablet Take 1 tablet (10 mg total) by mouth daily. 30 tablet 0  . chlorthalidone (HYGROTON) 25 MG tablet TAKE 1 TABLET BY MOUTH EVERY DAY 90 tablet 3  . clotrimazole (LOTRIMIN) 1 % cream Apply to affected area 2 times daily 15 g 0  . cyanocobalamin ([employer] VITAMIN B12) 1000 MCG tablet Take 1 tablet (1,000 mcg total) by mouth daily. 90 tablet 1  . diltiazem (TIAZAC) 360 MG 24 hr capsule Take 1 capsule (360 mg total) by mouth daily. 90 capsule 1  . ferrous sulfate 325 (65 FE) MG tablet ferrous sulfate 325 mg (65 mg iron) tablet  TAKE 1 TABLET BY MOUTH 2 TIMES DAILY WITH A MEAL    . fluticasone (FLONASE) 50 MCG/ACT nasal spray Place 1 spray into both nostrils daily. 16 g 0  . levothyroxine (SYNTHROID) 125 MCG tablet Take 1 tablet (125 mcg total) by mouth daily. 30 tablet 1  . lidocaine (LIDODERM) 5 % Place 1 patch onto the skin daily. Remove & Discard patch within 12 hours or as directed by MD 30 patch 0  . miconazole (MICOTIN) 2 % powder Apply topically as needed for itching. 70 g 0  . nystatin (MYCOSTATIN/NYSTOP) powder Apply 1 application topically 3 (three) times daily. 60 g 0  . Omega-3 Fatty Acids (FISH OIL) 1000 MG CAPS Take by mouth.    Marland Kitchen POTASSIUM CHLORIDE PO Take by mouth. (Patient not taking: Reported on 05/03/2020)    . pyridOXINE (VITAMIN B-6) 100 MG tablet Take 100 mg by mouth daily.    Marland Kitchen tiZANidine (ZANAFLEX) 2 MG tablet Take 1 tablet (2 mg total) by mouth every 8 (eight) hours as needed for muscle spasms. 15 tablet 0  . triamcinolone (KENALOG) 0.1 % Apply 1 application topically 2 (two) times daily. 45 each 0   No facility-administered medications prior to visit.    Allergies  Allergen Reactions  . Ace Inhibitors Anaphylaxis    REACTION: glossal edema  . Lisinopril Anaphylaxis  . Other  Nausea Only    ROS Review of Systems  Constitutional: Positive for chills, fatigue and fever.  HENT: Positive for congestion and rhinorrhea. Negative for sore throat and trouble swallowing.  Eyes: Negative.   Respiratory: Positive for cough. Negative for shortness of breath and wheezing.   Cardiovascular: Negative for chest pain.  Gastrointestinal: Positive for diarrhea. Negative for abdominal pain, nausea and vomiting.  Endocrine: Negative.   Genitourinary: Negative.   Musculoskeletal: Positive for myalgias.  Skin: Negative.   Allergic/Immunologic: Negative.   Neurological: Positive for headaches.  Hematological: Negative.   Psychiatric/Behavioral: Negative.       Objective:     There were no vitals taken for this visit. Wt Readings from Last 3 Encounters:  04/17/20 212 lb 6.4 oz (96.3 kg)  02/17/20 213 lb (96.6 kg)  11/10/19 215 lb (97.5 kg)     Health Maintenance Due  Topic Date Due  . COVID-19 Vaccine (1) Never done  . MAMMOGRAM  04/08/2006  . DEXA SCAN  Never done  . PNA vac Low Risk Adult (1 of 2 - PCV13) 05/23/2010  . COLONOSCOPY (Pts 45-64yrs Insurance coverage will need to be confirmed)  05/19/2012  . TETANUS/TDAP  04/08/2013  . INFLUENZA VACCINE  Never done    There are no preventive care reminders to display for this patient.  Lab Results  Component Value Date   TSH 7.940 (H) 05/03/2020   Lab Results  Component Value Date   WBC 10.3 05/03/2020   HGB 14.7 05/03/2020   HCT 42.4 05/03/2020   MCV 95 05/03/2020   PLT 325 05/03/2020   Lab Results  Component Value Date   NA 142 05/03/2020   K 3.4 (L) 05/03/2020   CO2 24 03/29/2020   GLUCOSE 123 (H) 05/03/2020   BUN 10 05/03/2020   CREATININE 1.00 05/03/2020   BILITOT 0.3 05/03/2020   ALKPHOS 73 05/03/2020   AST 16 05/03/2020   ALT 17 03/29/2020   PROT 6.2 05/03/2020   ALBUMIN 3.6 (L) 05/03/2020   CALCIUM 9.3 05/03/2020   ANIONGAP 12 03/29/2020   GFR 53.94 (L) 01/16/2015   Lab Results   Component Value Date   CHOL 166 11/02/2018   Lab Results  Component Value Date   HDL 56 11/02/2018   Lab Results  Component Value Date   LDLCALC 84 11/02/2018   Lab Results  Component Value Date   TRIG 132 11/02/2018   Lab Results  Component Value Date   CHOLHDL 3.0 11/02/2018   Lab Results  Component Value Date   HGBA1C 5.6 11/02/2018      Assessment & Plan:   Problem List Items Addressed This Visit      Other   COVID-19 - Primary   Relevant Medications   guaiFENesin (MUCINEX) 600 MG 12 hr tablet   predniSONE (DELTASONE) 20 MG tablet   Other Relevant Orders   Ambulatory referral for Covid Treatment     Assessment and Plan: 1. COVID-19 Patient evaluated, tested and sent home with instructions for home care and Quarantine. Instructed to seek further care if symptoms worsen.  Is set up with follow up for a virtual visit with mobile medicine unit on Thursday, February 3 at 4:10.  Patient education given on proper medications to use with her hypertension diagnosis.  - guaiFENesin (MUCINEX) 600 MG 12 hr tablet; Take 1 tablet (600 mg total) by mouth 2 (two) times daily.  Dispense: 60 tablet; Refill: 2 - predniSONE (DELTASONE) 20 MG tablet; Take 3 tablets (60 mg total) by mouth daily with breakfast for 2 days, THEN 2 tablets (40 mg total) daily with breakfast for 2 days, THEN 1 tablet (20 mg total) daily with breakfast for 2  days, THEN 0.5 tablets (10 mg total) daily with breakfast for 2 days.  Dispense: 13 tablet; Refill: 0 - Ambulatory referral for Covid Treatment   Follow Up Instructions:    I discussed the assessment and treatment plan with the patient. The patient was provided an opportunity to ask questions and all were answered. The patient agreed with the plan and demonstrated an understanding of the instructions.   The patient was advised to call back or seek an in-person evaluation if the symptoms worsen or if the condition fails to improve as  anticipated.    Meds ordered this encounter  Medications  . guaiFENesin (MUCINEX) 600 MG 12 hr tablet    Sig: Take 1 tablet (600 mg total) by mouth 2 (two) times daily.    Dispense:  60 tablet    Refill:  2    Order Specific Question:   Supervising Provider    Answer:   Joya Gaskins, PATRICK E [[id number]]  . predniSONE (DELTASONE) 20 MG tablet    Sig: Take 3 tablets (60 mg total) by mouth daily with breakfast for 2 days, THEN 2 tablets (40 mg total) daily with breakfast for 2 days, THEN 1 tablet (20 mg total) daily with breakfast for 2 days, THEN 0.5 tablets (10 mg total) daily with breakfast for 2 days.    Dispense:  13 tablet    Refill:  0    Order Specific Question:   Supervising Provider    Answer:   Asencion Noble E [[id number]]    I have reviewed the patient's medical history (PMH, PSH, Social History, Family History, Medications, and allergies) , and have been updated if relevant. I spent 25 minutes reviewing chart and non- face to face time with patient.    Follow-up: Return in about 2 days (around 05/11/2020).    Loraine Grip Mayers, PA-C

## 2020-05-10 ENCOUNTER — Other Ambulatory Visit: Payer: Self-pay | Admitting: Physician Assistant

## 2020-05-10 DIAGNOSIS — N183 Chronic kidney disease, stage 3 unspecified: Secondary | ICD-10-CM

## 2020-05-10 DIAGNOSIS — I2510 Atherosclerotic heart disease of native coronary artery without angina pectoris: Secondary | ICD-10-CM

## 2020-05-10 DIAGNOSIS — E66812 Obesity, class 2: Secondary | ICD-10-CM

## 2020-05-10 DIAGNOSIS — I10 Essential (primary) hypertension: Secondary | ICD-10-CM

## 2020-05-10 DIAGNOSIS — U071 COVID-19: Secondary | ICD-10-CM

## 2020-05-10 LAB — POC COVID19 BINAXNOW: SARS Coronavirus 2 Ag: POSITIVE — AB

## 2020-05-10 NOTE — Progress Notes (Signed)
I connected by phone with Kayla [name] on 05/10/2020 at 1:27 PM to discuss the potential use of a new treatment for mild to moderate COVID-19 viral infection in non-hospitalized patients.  This patient is a 75 y.o. female that meets the FDA criteria for Emergency Use Authorization of COVID monoclonal antibody sotrovimab.  Has a (+) direct SARS-CoV-2 viral test result  Has mild or moderate COVID-19   Is NOT hospitalized due to COVID-19  Is within 10 days of symptom onset  Has at least one of the high risk factor(s) for progression to severe COVID-19 and/or hospitalization as defined in EUA.  Specific high risk criteria : Older age (>/= 75 yo), BMI > 25, Chronic Kidney Disease (CKD) and Cardiovascular disease or hypertension   I have spoken and communicated the following to the patient or parent/caregiver regarding COVID monoclonal antibody treatment:  1. FDA has authorized the emergency use for the treatment of mild to moderate COVID-19 in adults and pediatric patients with positive results of direct SARS-CoV-2 viral testing who are 72 years of age and older weighing at least 40 kg, and who are at high risk for progressing to severe COVID-19 and/or hospitalization.  2. The significant known and potential risks and benefits of COVID monoclonal antibody, and the extent to which such potential risks and benefits are unknown.  3. Information on available alternative treatments and the risks and benefits of those alternatives, including clinical trials.  4. Patients treated with COVID monoclonal antibody should continue to self-isolate and use infection control measures (e.g., wear mask, isolate, social distance, avoid sharing personal items, clean and disinfect "high touch" surfaces, and frequent handwashing) according to CDC guidelines.   5. The patient or parent/caregiver has the option to accept or refuse COVID monoclonal antibody treatment.  After reviewing this information with the patient,  the patient has agreed to receive one of the available covid 19 monoclonal antibodies and will be provided an appropriate fact sheet prior to infusion.   [place], [place] 05/10/2020 1:27 PM

## 2020-05-10 NOTE — Addendum Note (Signed)
Addended by: Trecia Rogers on: 05/10/2020 02:56 PM   Modules accepted: Orders

## 2020-05-11 ENCOUNTER — Ambulatory Visit: Payer: Medicare PPO

## 2020-05-11 ENCOUNTER — Other Ambulatory Visit: Payer: Self-pay

## 2020-05-11 ENCOUNTER — Inpatient Hospital Stay (HOSPITAL_COMMUNITY)
Admission: EM | Admit: 2020-05-11 | Discharge: 2020-05-30 | DRG: 518 | Disposition: A | Discharge status: Home Health | Payer: Medicare PPO | Attending: Internal Medicine | Admitting: Internal Medicine

## 2020-05-11 ENCOUNTER — Ambulatory Visit (HOSPITAL_COMMUNITY)
Admission: RE | Admit: 2020-05-11 | Discharge: 2020-05-11 | Disposition: A | Discharge status: Home / Self Care | Payer: Medicare PPO | Source: Ambulatory Visit | Attending: Pulmonary Disease | Admitting: Pulmonary Disease

## 2020-05-11 ENCOUNTER — Encounter (HOSPITAL_COMMUNITY): Payer: Self-pay | Admitting: Emergency Medicine

## 2020-05-11 DIAGNOSIS — M5440 Lumbago with sciatica, unspecified side: Principal | ICD-10-CM | POA: Diagnosis present

## 2020-05-11 DIAGNOSIS — E876 Hypokalemia: Secondary | ICD-10-CM

## 2020-05-11 DIAGNOSIS — M25561 Pain in right knee: Secondary | ICD-10-CM | POA: Diagnosis present

## 2020-05-11 DIAGNOSIS — M5126 Other intervertebral disc displacement, lumbar region: Secondary | ICD-10-CM | POA: Diagnosis present

## 2020-05-11 DIAGNOSIS — M5430 Sciatica, unspecified side: Secondary | ICD-10-CM | POA: Diagnosis not present

## 2020-05-11 DIAGNOSIS — M4120 Other idiopathic scoliosis, site unspecified: Secondary | ICD-10-CM | POA: Diagnosis present

## 2020-05-11 DIAGNOSIS — U071 COVID-19: Secondary | ICD-10-CM

## 2020-05-11 DIAGNOSIS — K589 Irritable bowel syndrome without diarrhea: Secondary | ICD-10-CM | POA: Diagnosis present

## 2020-05-11 DIAGNOSIS — I10 Essential (primary) hypertension: Secondary | ICD-10-CM | POA: Diagnosis present

## 2020-05-11 DIAGNOSIS — Z9049 Acquired absence of other specified parts of digestive tract: Secondary | ICD-10-CM

## 2020-05-11 DIAGNOSIS — Z6838 Body mass index (BMI) 38.0-38.9, adult: Secondary | ICD-10-CM

## 2020-05-11 DIAGNOSIS — Z79899 Other long term (current) drug therapy: Secondary | ICD-10-CM

## 2020-05-11 DIAGNOSIS — M48061 Spinal stenosis, lumbar region without neurogenic claudication: Secondary | ICD-10-CM | POA: Diagnosis present

## 2020-05-11 DIAGNOSIS — K219 Gastro-esophageal reflux disease without esophagitis: Secondary | ICD-10-CM | POA: Diagnosis present

## 2020-05-11 DIAGNOSIS — R079 Chest pain, unspecified: Secondary | ICD-10-CM

## 2020-05-11 DIAGNOSIS — L4050 Arthropathic psoriasis, unspecified: Secondary | ICD-10-CM | POA: Diagnosis present

## 2020-05-11 DIAGNOSIS — J45909 Unspecified asthma, uncomplicated: Secondary | ICD-10-CM | POA: Diagnosis present

## 2020-05-11 DIAGNOSIS — F419 Anxiety disorder, unspecified: Secondary | ICD-10-CM | POA: Diagnosis present

## 2020-05-11 DIAGNOSIS — M4316 Spondylolisthesis, lumbar region: Secondary | ICD-10-CM | POA: Diagnosis present

## 2020-05-11 DIAGNOSIS — M25569 Pain in unspecified knee: Secondary | ICD-10-CM

## 2020-05-11 DIAGNOSIS — I2510 Atherosclerotic heart disease of native coronary artery without angina pectoris: Secondary | ICD-10-CM | POA: Diagnosis present

## 2020-05-11 DIAGNOSIS — E871 Hypo-osmolality and hyponatremia: Secondary | ICD-10-CM

## 2020-05-11 DIAGNOSIS — Z7952 Long term (current) use of systemic steroids: Secondary | ICD-10-CM

## 2020-05-11 DIAGNOSIS — M5431 Sciatica, right side: Secondary | ICD-10-CM

## 2020-05-11 DIAGNOSIS — R296 Repeated falls: Secondary | ICD-10-CM | POA: Diagnosis present

## 2020-05-11 DIAGNOSIS — M5416 Radiculopathy, lumbar region: Secondary | ICD-10-CM

## 2020-05-11 DIAGNOSIS — Z79890 Hormone replacement therapy (postmenopausal): Secondary | ICD-10-CM

## 2020-05-11 DIAGNOSIS — Z888 Allergy status to other drugs, medicaments and biological substances status: Secondary | ICD-10-CM

## 2020-05-11 DIAGNOSIS — E785 Hyperlipidemia, unspecified: Secondary | ICD-10-CM | POA: Diagnosis present

## 2020-05-11 DIAGNOSIS — M797 Fibromyalgia: Secondary | ICD-10-CM | POA: Diagnosis present

## 2020-05-11 DIAGNOSIS — Z7982 Long term (current) use of aspirin: Secondary | ICD-10-CM

## 2020-05-11 DIAGNOSIS — E039 Hypothyroidism, unspecified: Secondary | ICD-10-CM | POA: Diagnosis present

## 2020-05-11 DIAGNOSIS — Z419 Encounter for procedure for purposes other than remedying health state, unspecified: Secondary | ICD-10-CM

## 2020-05-11 LAB — CBC WITH DIFFERENTIAL/PLATELET
Abs Immature Granulocytes: 0.04 10*3/uL (ref 0.00–0.07)
Basophils Absolute: 0 10*3/uL (ref 0.0–0.1)
Basophils Relative: 0 %
Eosinophils Absolute: 0 10*3/uL (ref 0.0–0.5)
Eosinophils Relative: 0 %
HCT: 44.5 % (ref 36.0–46.0)
Hemoglobin: 16.1 g/dL — ABNORMAL HIGH (ref 12.0–15.0)
Immature Granulocytes: 0 %
Lymphocytes Relative: 5 %
Lymphs Abs: 0.6 10*3/uL — ABNORMAL LOW (ref 0.7–4.0)
MCH: 32.9 pg (ref 26.0–34.0)
MCHC: 36.2 g/dL — ABNORMAL HIGH (ref 30.0–36.0)
MCV: 90.8 fL (ref 80.0–100.0)
Monocytes Absolute: 0.4 10*3/uL (ref 0.1–1.0)
Monocytes Relative: 4 %
Neutro Abs: 10.5 10*3/uL — ABNORMAL HIGH (ref 1.7–7.7)
Neutrophils Relative %: 91 %
Platelets: 270 10*3/uL (ref 150–400)
RBC: 4.9 MIL/uL (ref 3.87–5.11)
RDW: 12.9 % (ref 11.5–15.5)
WBC: 11.6 10*3/uL — ABNORMAL HIGH (ref 4.0–10.5)
nRBC: 0 % (ref 0.0–0.2)

## 2020-05-11 LAB — COMPREHENSIVE METABOLIC PANEL
ALT: 34 U/L (ref 0–44)
AST: 48 U/L — ABNORMAL HIGH (ref 15–41)
Albumin: 4.1 g/dL (ref 3.5–5.0)
Alkaline Phosphatase: 60 U/L (ref 38–126)
Anion gap: 14 (ref 5–15)
BUN: 16 mg/dL (ref 8–23)
CO2: 23 mmol/L (ref 22–32)
Calcium: 9.6 mg/dL (ref 8.9–10.3)
Chloride: 86 mmol/L — ABNORMAL LOW (ref 98–111)
Creatinine, Ser: 0.89 mg/dL (ref 0.44–1.00)
GFR, Estimated: >60 mL/min (ref >60)
Glucose, Bld: 159 mg/dL — ABNORMAL HIGH (ref 70–99)
Potassium: 2.8 mmol/L — ABNORMAL LOW (ref 3.5–5.1)
Sodium: 123 mmol/L — ABNORMAL LOW (ref 135–145)
Total Bilirubin: 0.7 mg/dL (ref 0.3–1.2)
Total Protein: 7.5 g/dL (ref 6.5–8.1)

## 2020-05-11 MED ORDER — DIPHENHYDRAMINE HCL 50 MG/ML IJ SOLN: 50.0000 mg | Freq: Once | INTRAMUSCULAR | Status: DC | PRN

## 2020-05-11 MED ORDER — SODIUM CHLORIDE 0.9 % IV SOLN: INTRAVENOUS | Status: DC | PRN

## 2020-05-11 MED ORDER — ONDANSETRON 4 MG PO TBDP
4.0000 mg | ORAL_TABLET | Freq: Once | ORAL | Status: AC
  Administered 2020-05-11: 4 mg via ORAL
  Filled 2020-05-11: qty 1

## 2020-05-11 MED ORDER — HYDROMORPHONE HCL 1 MG/ML IJ SOLN
1.0000 mg | Freq: Once | INTRAMUSCULAR | Status: AC
  Administered 2020-05-11: 1 mg via INTRAVENOUS
  Filled 2020-05-11: qty 1

## 2020-05-11 MED ORDER — SOTROVIMAB 500 MG/8ML IV SOLN
500.0000 mg | Freq: Once | INTRAVENOUS | Status: AC
  Administered 2020-05-11: 500 mg via INTRAVENOUS

## 2020-05-11 MED ORDER — FAMOTIDINE IN NACL 20-0.9 MG/50ML-% IV SOLN: 20.0000 mg | Freq: Once | INTRAVENOUS | Status: DC | PRN

## 2020-05-11 MED ORDER — EPINEPHRINE 0.3 MG/0.3ML IJ SOAJ: 0.3000 mg | Freq: Once | INTRAMUSCULAR | Status: DC | PRN

## 2020-05-11 MED ORDER — METHYLPREDNISOLONE SODIUM SUCC 125 MG IJ SOLR: 125.0000 mg | Freq: Once | INTRAMUSCULAR | Status: DC | PRN

## 2020-05-11 MED ORDER — POTASSIUM CHLORIDE CRYS ER 20 MEQ PO TBCR
40.0000 meq | EXTENDED_RELEASE_TABLET | Freq: Once | ORAL | Status: AC
  Administered 2020-05-12: 40 meq via ORAL
  Filled 2020-05-11: qty 2

## 2020-05-11 MED ORDER — MORPHINE SULFATE (PF) 4 MG/ML IV SOLN
4.0000 mg | Freq: Once | INTRAVENOUS | Status: AC
  Administered 2020-05-12: 4 mg via INTRAMUSCULAR
  Filled 2020-05-11: qty 1

## 2020-05-11 MED ORDER — METHOCARBAMOL 500 MG PO TABS
500.0000 mg | ORAL_TABLET | Freq: Once | ORAL | Status: AC
  Administered 2020-05-12: 500 mg via ORAL
  Filled 2020-05-11: qty 1

## 2020-05-11 MED ORDER — ALBUTEROL SULFATE HFA 108 (90 BASE) MCG/ACT IN AERS: 2.0000 | INHALATION_SPRAY | Freq: Once | RESPIRATORY_TRACT | Status: DC | PRN

## 2020-05-11 MED ORDER — HYDROMORPHONE HCL 1 MG/ML IJ SOLN
1.0000 mg | Freq: Once | INTRAMUSCULAR | Status: DC
Start: 2020-05-12 — End: 2020-05-11
  Filled 2020-05-11: qty 1

## 2020-05-11 MED ORDER — SODIUM CHLORIDE 0.9 % IV BOLUS
1000.0000 mL | Freq: Once | INTRAVENOUS | Status: AC
  Administered 2020-05-12: 1000 mL via INTRAVENOUS

## 2020-05-11 NOTE — Progress Notes (Signed)
Patient did not answer for PCP.

## 2020-05-11 NOTE — Progress Notes (Signed)
Patient reviewed Fact Sheet for Patients, Parents, and Caregivers for Emergency Use Authorization (EUA) of sotrovimab for the Treatment of Coronavirus. Patient also reviewed and is agreeable to the estimated cost of treatment. Patient is agreeable to proceed.   

## 2020-05-11 NOTE — ED Provider Notes (Addendum)
[facility] DEPT Provider Note   CSN: [id number] Arrival date & time: 05/11/20  2036     History Chief Complaint  Patient presents with  . Sciatica    Pt BIB [facility] from home for rt side sciatic pain radiating down rt leg. Pt dx with covid on Tuesday and received monoclonal infusion today at [facility] infusion center. Pt reported she is taking prednisone for sciatica but it is about to taper off and the pain became worse today.     Kayla [name] is a 75 y.o. female.  HPI 75 year old female with a history of anxiety, anemia, fibromyalgia, GERD, hyperlipidemia, hypertension, hypothyroid, psoriatic arthritis presents to the ER with complaints of right sided sciatica radiating down her leg.  She reports that she was seen here several weeks ago) no record of this in our system) and was prescribed a Medrol Dosepak.  She states that this did improve her symptoms, however she is at the end of the taper and her pain has returned.  She denies any loss of bowel bladder control, no weakness, foot drop.  States it hurts to touch her skin on the right leg.  She was recently diagnosed with Covid on Tuesday and received monoclonal antibody infusion.  Denies any chest pain or shortness of breath.  Anxious appearing on arrival, is asking to be admitted as she is in such severe pain.  She is also requesting to be placed on a morphine drip.  He has not taken any other medicines other than the Medrol Dosepak at home.  She does have a PCP but has not followed up with spine or  orthopedics.    Past Medical History:  Diagnosis Date  . Anemia   . Anxiety   . Asthma   . B12 deficiency   . Bursitis   . Chronic LBP   . Chronic UTI   . Diastolic dysfunction   . Fibromyalgia   . GERD (gastroesophageal reflux disease)   . Hyperlipemia   . Hypertension   . Hypothyroid   . IBS (irritable bowel syndrome)   . Inflammatory osteoarthritis   . Kidney infection   . Psoriatic arthritis ([place])   .  S/P cardiac cath 11/30/2007   normal coronaries - Dr. Burt Knack (Dr. Debara Pickett reviewed films on 01/10/2016)    Patient Active Problem List   Diagnosis Date Noted  . Hyponatremia 05/12/2020  . COVID-19 05/09/2020  . Hypokalemia 05/04/2020  . Dysphagia 04/17/2020  . Renal insufficiency 12/29/2018  . Cyst of left ovary 12/29/2018  . Esophageal dysmotility 12/29/2018  . 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 Acquired hypothyroidism 12/29/2018  . Chronic maxillary sinusitis 07/07/2018  . Snoring 06/01/2018  . Costochondritis 06/01/2018  . Family history of heart disease 06/01/2018  . Class 2 severe obesity due to excess calories with serious comorbidity and body mass index (BMI) of 38.0 to 38.9 in adult ([place]) 06/01/2018  . Multiple atypical skin moles 04/28/2017  . Depression, recurrent ([place]) 07/16/2016  . Adjustment disorder with mixed anxiety and depressed mood 01/15/2016  . Fe Def Anemia 12/22/2015  . Scoliosis (and kyphoscoliosis), idiopathic 12/20/2015  . Osteopenia 12/20/2015  . Fibromyalgia 08/02/2014  . GAD (generalized anxiety disorder) 09/17/2012  . Vitamin D deficiency 01/24/2012  . Fatigue 10/14/2011  . B12 deficiency   . Hyperlipidemia 11/30/2007  . Coronary artery disease involving native heart without angina pectoris 11/30/2007  . Essential hypertension 01/27/2007  . Reactive airway disease that is not asthma 01/27/2007  .  GERD 01/27/2007  . IBS 01/27/2007    Past Surgical History:  Procedure Laterality Date  . CESAREAN SECTION  06/10/1975  . CHOLECYSTECTOMY    . NECK SURGERY  1998  . repair broken C6 & C7       OB History   No obstetric history on file.     Family History  Problem Relation Age of Onset  . Heart attack Father 57  . Lung cancer Other        uncle, non smoker  . Leukemia Other        uncle  . Cancer Mother        oral  . Stroke Mother   . Alzheimer's disease Mother        at 5  . Cancer Maternal Aunt         breast  . Alcohol abuse Maternal Aunt   . Alcohol abuse Maternal Uncle   . Cancer Maternal Uncle        lung, stomach, oral  . Cancer Paternal Uncle        lung  . Congestive Heart Failure Maternal Grandfather   . Heart disease Maternal Grandfather   . Heart attack Paternal Grandfather   . Cancer Paternal Uncle        bone  . Brain cancer Maternal Grandmother   . Alzheimer's disease Other        maternal great grandmother   . Colon cancer Neg Hx   . Breast cancer Neg Hx   . Diabetes Neg Hx     Social History   Tobacco Use  . Smoking status: Never Smoker  . Smokeless tobacco: Never Used  Vaping Use  . Vaping Use: Never used  Substance Use Topics  . Alcohol use: No  . Drug use: No    Home Medications Prior to Admission medications   Medication Sig Start Date End Date Taking? Authorizing Provider  aspirin EC 81 MG tablet Take 1 tablet (81 mg total) by mouth daily. 12/15/18   Buford Dresser, MD  Calcium-Magnesium-Zinc (CAL-MAG-ZINC PO) Take by mouth 2 (two) times daily.    [provider]  cetirizine (ZYRTEC ALLERGY) 10 MG tablet Take 1 tablet (10 mg total) by mouth daily. 04/22/20   Hall-Potvin, [place], PA-C  chlorthalidone (HYGROTON) 25 MG tablet TAKE 1 TABLET BY MOUTH EVERY DAY 03/27/20   Buford Dresser, MD  clotrimazole (LOTRIMIN) 1 % cream Apply to affected area 2 times daily 03/16/20   Hall-Potvin, [place], PA-C  cyanocobalamin ([facility] VITAMIN B12) 1000 MCG tablet Take 1 tablet (1,000 mcg total) by mouth daily. 03/02/19   Ladell Pier, MD  diltiazem (TIAZAC) 360 MG 24 hr capsule Take 1 capsule (360 mg total) by mouth daily. 03/09/19   Buford Dresser, MD  ferrous sulfate 325 (65 FE) MG tablet ferrous sulfate 325 mg (65 mg iron) tablet  TAKE 1 TABLET BY MOUTH 2 TIMES DAILY WITH A MEAL    [provider]  fluticasone (FLONASE) 50 MCG/ACT nasal spray Place 1 spray into both nostrils daily. 04/22/20   Hall-Potvin, [place], PA-C   guaiFENesin (MUCINEX) 600 MG 12 hr tablet Take 1 tablet (600 mg total) by mouth 2 (two) times daily. 05/09/20 08/07/20  Mayers, Loraine Grip, PA-C  levothyroxine (SYNTHROID) 125 MCG tablet Take 1 tablet (125 mcg total) by mouth daily. 05/04/20 07/03/20  Mayers, Cari S, PA-C  lidocaine (LIDODERM) 5 % Place 1 patch onto the skin daily. Remove & Discard patch within 12 hours or as directed  by MD 03/29/20   Lorelle Gibbs, DO  miconazole (MICOTIN) 2 % powder Apply topically as needed for itching. 03/16/20   Hall-Potvin, [place], PA-C  nystatin (MYCOSTATIN/NYSTOP) powder Apply 1 application topically 3 (three) times daily. 02/17/20   Nicolette Bang, DO  Omega-3 Fatty Acids (FISH OIL) 1000 MG CAPS Take by mouth. 01/15/16   [provider]  POTASSIUM CHLORIDE PO Take by mouth. Patient not taking: Reported on 05/03/2020    [provider]  predniSONE (DELTASONE) 20 MG tablet Take 3 tablets (60 mg total) by mouth daily with breakfast for 2 days, THEN 2 tablets (40 mg total) daily with breakfast for 2 days, THEN 1 tablet (20 mg total) daily with breakfast for 2 days, THEN 0.5 tablets (10 mg total) daily with breakfast for 2 days. 05/09/20 05/17/20  Mayers, Cari S, PA-C  pyridOXINE (VITAMIN B-6) 100 MG tablet Take 100 mg by mouth daily.    [provider]  tiZANidine (ZANAFLEX) 2 MG tablet Take 1 tablet (2 mg total) by mouth every 8 (eight) hours as needed for muscle spasms. 04/17/20   Camillia Herter, NP  triamcinolone (KENALOG) 0.1 % Apply 1 application topically 2 (two) times daily. 03/16/20   Hall-Potvin, [place], PA-C    Allergies    Ace inhibitors, Lisinopril, and Other  Review of Systems   Review of Systems  Constitutional: Negative for chills and fever.  HENT: Negative for ear pain and sore throat.   Eyes: Negative for pain and visual disturbance.  Respiratory: Negative for cough and shortness of breath.   Cardiovascular: Negative for chest pain and palpitations.   Gastrointestinal: Negative for abdominal pain and vomiting.  Genitourinary: Negative for dysuria and hematuria.  Musculoskeletal: Positive for arthralgias and back pain.  Skin: Negative for color change and rash.  Neurological: Negative for seizures, syncope and weakness.  All other systems reviewed and are negative.   Physical Exam Updated Vital Signs BP (!) 151/111   Pulse 89   Temp 97.7 F (36.5 C) (Oral)   Resp 18   Ht 5\' 4"  (1.626 m)   Wt 97.5 kg   SpO2 93%   BMI 36.90 kg/m   Physical Exam Vitals and nursing note reviewed.  Constitutional:      General: She is not in acute distress.    Appearance: She is well-developed and well-nourished.     Comments: Anxious appearing, moaning  HENT:     Head: Normocephalic and atraumatic.     Mouth/Throat:     Mouth: Mucous membranes are moist.     Pharynx: Oropharynx is clear.  Eyes:     Conjunctiva/sclera: Conjunctivae normal.  Cardiovascular:     Rate and Rhythm: Normal rate and regular rhythm.     Pulses: Normal pulses.     Heart sounds: No murmur heard.   Pulmonary:     Effort: Pulmonary effort is normal. No respiratory distress.     Breath sounds: Normal breath sounds.  Abdominal:     General: Abdomen is flat.     Palpations: Abdomen is soft.     Tenderness: There is no abdominal tenderness.  Musculoskeletal:        General: Tenderness present. No edema.     Cervical back: Normal range of motion and neck supple.     Comments: 5/5 strength lower extremities bilaterally.  Sensations intact.  Moving all 4 extremities.  Midline tenderness to the L-spine, with associated paraspinal muscle tenderness.  Tenderness to the SI joint on the  right as well.  No overlying step-offs, crepitus, erythema, warmth.  Skin:    General: Skin is warm and dry.  Neurological:     General: No focal deficit present.     Mental Status: She is alert and oriented to person, place, and time.     Sensory: No sensory deficit.     Motor: No  weakness.  Psychiatric:        Mood and Affect: Mood and affect and mood normal.        Behavior: Behavior normal.     ED Results / Procedures / Treatments   Labs (all labs ordered are listed, but only abnormal results are displayed) Labs Reviewed  CBC WITH DIFFERENTIAL/PLATELET - Abnormal; Notable for the following components:      Result Value   WBC 11.6 (*)    Hemoglobin 16.1 (*)    MCHC 36.2 (*)    Neutro Abs 10.5 (*)    Lymphs Abs 0.6 (*)    All other components within normal limits  COMPREHENSIVE METABOLIC PANEL - Abnormal; Notable for the following components:   Sodium 123 (*)    Potassium 2.8 (*)    Chloride 86 (*)    Glucose, Bld 159 (*)    AST 48 (*)    All other components within normal limits  URINALYSIS, ROUTINE W REFLEX MICROSCOPIC    EKG None  Radiology No results found.  Procedures Procedures   Medications Ordered in ED Medications  sodium chloride 0.9 % bolus 1,000 mL (has no administration in time range)  potassium chloride SA (KLOR-CON) CR tablet 40 mEq (has no administration in time range)  methocarbamol (ROBAXIN) tablet 500 mg (has no administration in time range)  morphine 4 MG/ML injection 4 mg (has no administration in time range)  LORazepam (ATIVAN) tablet 1 mg (has no administration in time range)  HYDROmorphone (DILAUDID) injection 1 mg (1 mg Intravenous Given 05/11/20 2224)  ondansetron (ZOFRAN-ODT) disintegrating tablet 4 mg (4 mg Oral Given 05/11/20 2223)    ED Course  I have reviewed the triage vital signs and the nursing notes.  Pertinent labs & imaging results that were available during my care of the patient were reviewed by me and considered in my medical decision making (see chart for details).    MDM Rules/Calculators/A&P                          75 year old female who presents to the ER with complaints of worsening right-sided sciatica.  On arrival, she is very anxious appearing, rocking back and forth, states she is in  severe pain.  Vitals on arrival overall reassuring.  No red flag signs on exam, moving all 4 extremities, intact strength and sensations in her lower extremities.  Imaging reviewed from December 2021 visit, with some arthritic changes and mild degenerative changes of the lumbar spine.  Plan for some basic labs, UA to rule out electrolyte abnormalities or possible kidney stones.   Discussed with the patient that we do not place patients on morphine drips, however we will attempt to get her pain at least mildly under control.  She is agreeable to this.  Personally reviewed and interpreted her labs -CBC with leukocytosis of 11.6, hemoglobin 16.1 -CMP with a hyponatremia 123, potassium of 2.8, mildly elevated AST likely consistent with COVID-19.   Patient was also seen and evaluated by Dr. Sherry Ruffing.  The patient has not had any imaging of her lumbar spine in quite  some time.  We will order CT of the lumbar spine to rule out any acute pathology, give her another dose of pain medicine.  Will repeat potassium, start fluids.  Given her electrolyte findings with the patient, she would like to be admitted for further pain control and rehydration.  Consulted hospitalist team for admission, CT pending.  Spoke with Dr. Hal Hope who will admit the patient for further management and treatment     Final Clinical Impression(s) / ED Diagnoses Final diagnoses:  Sciatica of right side  Hyponatremia  Hypokalemia    Rx / DC Orders ED Discharge Orders    None         Lyndel Safe 05/12/20 0013    Tegeler, Gwenyth Allegra, MD 05/13/20 0021

## 2020-05-11 NOTE — Progress Notes (Signed)
Patient was seen at the infusion clinic on today. Patient still reports fatigue and congestion.

## 2020-05-11 NOTE — Discharge Instructions (Signed)
10 Things You Can Do to Manage Your COVID-19 Symptoms at Home °If you have possible or confirmed COVID-19: °1. Stay home except to get medical care. °2. Monitor your symptoms carefully. If your symptoms get worse, call your healthcare provider immediately. °3. Get rest and stay hydrated. °4. If you have a medical appointment, call the healthcare provider ahead of time and tell them that you have or may have COVID-19. °5. For medical emergencies, call 911 and notify the dispatch personnel that you have or may have COVID-19. °6. Cover your cough and sneezes with a tissue or use the inside of your elbow. °7. Wash your hands often with soap and water for at least 20 seconds or clean your hands with an alcohol-based hand sanitizer that contains at least 60% alcohol. °8. As much as possible, stay in a specific room and away from other people in your home. Also, you should use a separate bathroom, if available. If you need to be around other people in or outside of the home, wear a mask. °9. Avoid sharing personal items with other people in your household, like dishes, towels, and bedding. °10. Clean all surfaces that are touched often, like counters, tabletops, and doorknobs. Use household cleaning sprays or wipes according to the label instructions. °cdc.gov/coronavirus °10/22/2019 °This information is not intended to replace advice given to you by your health care provider. Make sure you discuss any questions you have with your health care provider. °Document Revised: 02/07/2020 Document Reviewed: 02/07/2020 °Elsevier Patient Education © 2021 [employer] °What types of side effects do monoclonal antibody drugs cause?  °Common side effects ° °In general, the more common side effects caused by monoclonal antibody drugs include: °• Allergic reactions, such as hives or itching °• Flu-like signs and symptoms, including chills, fatigue, fever, and muscle aches and pains °• Nausea, vomiting °• Diarrhea °• Skin rashes °• Low  blood pressure ° ° °The CDC is recommending patients who receive monoclonal antibody treatments wait at least 90 days before being vaccinated. ° °Currently, there are no data on the safety and efficacy of mRNA COVID-19 vaccines in persons who received monoclonal antibodies or convalescent plasma as part of COVID-19 treatment. Based on the estimated half-life of such therapies as well as evidence suggesting that reinfection is uncommon in the 90 days after initial infection, vaccination should be deferred for at least 90 days, as a precautionary measure until additional information becomes available, to avoid interference of the antibody treatment with vaccine-induced immune responses. °Patient reviewed Fact Sheet for Patients, Parents, and Caregivers for Emergency Use Authorization (EUA) of sotrovimab for the Treatment of Coronavirus. Patient also reviewed and is agreeable to the estimated cost of treatment. Patient is agreeable to proceed.   ° °

## 2020-05-11 NOTE — Progress Notes (Signed)
  Diagnosis: COVID-19  Physician: Dr Joya Gaskins  Procedure: Diagnosis: [id number]  Physician: Dr. Asencion Noble  Procedure: Covid Infusion Clinic Med: Sotrovimab infusion - Provided patient with sotrovimab fact sheet for patients, parents, and caregivers prior to infusion.   Complications: No immediate complications noted  Discharge: Discharged home

## 2020-05-11 NOTE — ED Provider Notes (Incomplete Revision)
[place] DEPT Provider Note   CSN: [id number] Arrival date & time: 05/11/20  2036     History Chief Complaint  Patient presents with  . Sciatica    Pt BIB [facility] from home for rt side sciatic pain radiating down rt leg. Pt dx with covid on Tuesday and received monoclonal infusion today at [facility] infusion center. Pt reported she is taking prednisone for sciatica but it is about to taper off and the pain became worse today.     Kayla [name] is a 75 y.o. female.  HPI 75 year old female with a history of anxiety, anemia, fibromyalgia, GERD, hyperlipidemia, hypertension, hypothyroid, psoriatic arthritis presents to the ER with complaints of right sided sciatica radiating down her leg.  She reports that she was seen here several weeks ago) no record of this in our system) and was prescribed a Medrol Dosepak.  She states that this did improve her symptoms, however she is at the end of the taper and her pain has returned.  She denies any loss of bowel bladder control, no weakness, foot drop.  States it hurts to touch her skin on the right leg.  She was recently diagnosed with Covid on Tuesday and received monoclonal antibody infusion.  Denies any chest pain or shortness of breath.  Anxious appearing on arrival, is asking to be admitted as she is in such severe pain.  She is also requesting to be placed on a morphine drip.  He has not taken any other medicines other than the Medrol Dosepak at home.  She does have a PCP but has not followed up with spine or  orthopedics.    Past Medical History:  Diagnosis Date  . Anemia   . Anxiety   . Asthma   . B12 deficiency   . Bursitis   . Chronic LBP   . Chronic UTI   . Diastolic dysfunction   . Fibromyalgia   . GERD (gastroesophageal reflux disease)   . Hyperlipemia   . Hypertension   . Hypothyroid   . IBS (irritable bowel syndrome)   . Inflammatory osteoarthritis   . Kidney infection   . Psoriatic arthritis ([place])   .  S/P cardiac cath 11/30/2007   normal coronaries - Dr. Burt Knack (Dr. Debara Pickett reviewed films on 01/10/2016)    Patient Active Problem List   Diagnosis Date Noted  . COVID-19 05/09/2020  . Hypokalemia 05/04/2020  . Dysphagia 04/17/2020  . Renal insufficiency 12/29/2018  . Cyst of left ovary 12/29/2018  . Esophageal dysmotility 12/29/2018  . 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 Acquired hypothyroidism 12/29/2018  . Chronic maxillary sinusitis 07/07/2018  . Snoring 06/01/2018  . Costochondritis 06/01/2018  . Family history of heart disease 06/01/2018  . Class 2 severe obesity due to excess calories with serious comorbidity and body mass index (BMI) of 38.0 to 38.9 in adult ([place]) 06/01/2018  . Multiple atypical skin moles 04/28/2017  . Depression, recurrent ([place]) 07/16/2016  . Adjustment disorder with mixed anxiety and depressed mood 01/15/2016  . Fe Def Anemia 12/22/2015  . Scoliosis (and kyphoscoliosis), idiopathic 12/20/2015  . Osteopenia 12/20/2015  . Fibromyalgia 08/02/2014  . GAD (generalized anxiety disorder) 09/17/2012  . Vitamin D deficiency 01/24/2012  . Fatigue 10/14/2011  . B12 deficiency   . Hyperlipidemia 11/30/2007  . Coronary artery disease involving native heart without angina pectoris 11/30/2007  . Essential hypertension 01/27/2007  . Reactive airway disease that is not asthma 01/27/2007  . GERD 01/27/2007  .  IBS 01/27/2007    Past Surgical History:  Procedure Laterality Date  . CESAREAN SECTION  06/10/1975  . CHOLECYSTECTOMY    . NECK SURGERY  1998  . repair broken C6 & C7       OB History   No obstetric history on file.     Family History  Problem Relation Age of Onset  . Heart attack Father 62  . Lung cancer Other        uncle, non smoker  . Leukemia Other        uncle  . Cancer Mother        oral  . Stroke Mother   . Alzheimer's disease Mother        at 49  . Cancer Maternal Aunt        breast  . Alcohol abuse  Maternal Aunt   . Alcohol abuse Maternal Uncle   . Cancer Maternal Uncle        lung, stomach, oral  . Cancer Paternal Uncle        lung  . Congestive Heart Failure Maternal Grandfather   . Heart disease Maternal Grandfather   . Heart attack Paternal Grandfather   . Cancer Paternal Uncle        bone  . Brain cancer Maternal Grandmother   . Alzheimer's disease Other        maternal great grandmother   . Colon cancer Neg Hx   . Breast cancer Neg Hx   . Diabetes Neg Hx     Social History   Tobacco Use  . Smoking status: Never Smoker  . Smokeless tobacco: Never Used  Vaping Use  . Vaping Use: Never used  Substance Use Topics  . Alcohol use: No  . Drug use: No    Home Medications Prior to Admission medications   Medication Sig Start Date End Date Taking? Authorizing Provider  aspirin EC 81 MG tablet Take 1 tablet (81 mg total) by mouth daily. 12/15/18   Buford Dresser, MD  Calcium-Magnesium-Zinc (CAL-MAG-ZINC PO) Take by mouth 2 (two) times daily.    [provider]  cetirizine (ZYRTEC ALLERGY) 10 MG tablet Take 1 tablet (10 mg total) by mouth daily. 04/22/20   Hall-Potvin, [place], PA-C  chlorthalidone (HYGROTON) 25 MG tablet TAKE 1 TABLET BY MOUTH EVERY DAY 03/27/20   Buford Dresser, MD  clotrimazole (LOTRIMIN) 1 % cream Apply to affected area 2 times daily 03/16/20   Hall-Potvin, [place], PA-C  cyanocobalamin ([employer] VITAMIN B12) 1000 MCG tablet Take 1 tablet (1,000 mcg total) by mouth daily. 03/02/19   Ladell Pier, MD  diltiazem (TIAZAC) 360 MG 24 hr capsule Take 1 capsule (360 mg total) by mouth daily. 03/09/19   Buford Dresser, MD  ferrous sulfate 325 (65 FE) MG tablet ferrous sulfate 325 mg (65 mg iron) tablet  TAKE 1 TABLET BY MOUTH 2 TIMES DAILY WITH A MEAL    [provider]  fluticasone (FLONASE) 50 MCG/ACT nasal spray Place 1 spray into both nostrils daily. 04/22/20   Hall-Potvin, [place], PA-C  guaiFENesin (MUCINEX) 600 MG  12 hr tablet Take 1 tablet (600 mg total) by mouth 2 (two) times daily. 05/09/20 08/07/20  Mayers, Loraine Grip, PA-C  levothyroxine (SYNTHROID) 125 MCG tablet Take 1 tablet (125 mcg total) by mouth daily. 05/04/20 07/03/20  Mayers, Cari S, PA-C  lidocaine (LIDODERM) 5 % Place 1 patch onto the skin daily. Remove & Discard patch within 12 hours or as directed by MD 03/29/20  Horton, Kristie M, DO  miconazole (MICOTIN) 2 % powder Apply topically as needed for itching. 03/16/20   Hall-Potvin, [place], PA-C  nystatin (MYCOSTATIN/NYSTOP) powder Apply 1 application topically 3 (three) times daily. 02/17/20   Nicolette Bang, DO  Omega-3 Fatty Acids (FISH OIL) 1000 MG CAPS Take by mouth. 01/15/16   [provider]  POTASSIUM CHLORIDE PO Take by mouth. Patient not taking: Reported on 05/03/2020    [provider]  predniSONE (DELTASONE) 20 MG tablet Take 3 tablets (60 mg total) by mouth daily with breakfast for 2 days, THEN 2 tablets (40 mg total) daily with breakfast for 2 days, THEN 1 tablet (20 mg total) daily with breakfast for 2 days, THEN 0.5 tablets (10 mg total) daily with breakfast for 2 days. 05/09/20 05/17/20  Mayers, Cari S, PA-C  pyridOXINE (VITAMIN B-6) 100 MG tablet Take 100 mg by mouth daily.    [provider]  tiZANidine (ZANAFLEX) 2 MG tablet Take 1 tablet (2 mg total) by mouth every 8 (eight) hours as needed for muscle spasms. 04/17/20   Camillia Herter, NP  triamcinolone (KENALOG) 0.1 % Apply 1 application topically 2 (two) times daily. 03/16/20   Hall-Potvin, [place], PA-C    Allergies    Ace inhibitors, Lisinopril, and Other  Review of Systems   Review of Systems  Constitutional: Negative for chills and fever.  HENT: Negative for ear pain and sore throat.   Eyes: Negative for pain and visual disturbance.  Respiratory: Negative for cough and shortness of breath.   Cardiovascular: Negative for chest pain and palpitations.  Gastrointestinal: Negative for  abdominal pain and vomiting.  Genitourinary: Negative for dysuria and hematuria.  Musculoskeletal: Positive for arthralgias and back pain.  Skin: Negative for color change and rash.  Neurological: Negative for seizures, syncope and weakness.  All other systems reviewed and are negative.   Physical Exam Updated Vital Signs BP (!) 139/93   Pulse 83   Temp 97.7 F (36.5 C) (Oral)   Resp 20   Ht 5\' 4"  (1.626 m)   Wt 97.5 kg   SpO2 91%   BMI 36.90 kg/m   Physical Exam Vitals and nursing note reviewed.  Constitutional:      General: She is not in acute distress.    Appearance: She is well-developed and well-nourished.     Comments: Anxious appearing, moaning  HENT:     Head: Normocephalic and atraumatic.     Mouth/Throat:     Mouth: Mucous membranes are moist.     Pharynx: Oropharynx is clear.  Eyes:     Conjunctiva/sclera: Conjunctivae normal.  Cardiovascular:     Rate and Rhythm: Normal rate and regular rhythm.     Pulses: Normal pulses.     Heart sounds: No murmur heard.   Pulmonary:     Effort: Pulmonary effort is normal. No respiratory distress.     Breath sounds: Normal breath sounds.  Abdominal:     General: Abdomen is flat.     Palpations: Abdomen is soft.     Tenderness: There is no abdominal tenderness.  Musculoskeletal:        General: No edema.     Cervical back: Normal range of motion and neck supple.     Comments: 5/5 strength lower extremities bilaterally.  Sensations intact.  Moving all 4 extremities  Skin:    General: Skin is warm and dry.  Neurological:     General: No focal deficit present.  Mental Status: She is alert and oriented to person, place, and time.     Sensory: No sensory deficit.     Motor: No weakness.  Psychiatric:        Mood and Affect: Mood and affect and mood normal.        Behavior: Behavior normal.     ED Results / Procedures / Treatments   Labs (all labs ordered are listed, but only abnormal results are  displayed) Labs Reviewed  CBC WITH DIFFERENTIAL/PLATELET - Abnormal; Notable for the following components:      Result Value   WBC 11.6 (*)    Hemoglobin 16.1 (*)    MCHC 36.2 (*)    Neutro Abs 10.5 (*)    Lymphs Abs 0.6 (*)    All other components within normal limits  COMPREHENSIVE METABOLIC PANEL - Abnormal; Notable for the following components:   Sodium 123 (*)    Potassium 2.8 (*)    Chloride 86 (*)    Glucose, Bld 159 (*)    AST 48 (*)    All other components within normal limits  URINALYSIS, ROUTINE W REFLEX MICROSCOPIC    EKG None  Radiology No results found.  Procedures Procedures   Medications Ordered in ED Medications  sodium chloride 0.9 % bolus 1,000 mL (has no administration in time range)  potassium chloride SA (KLOR-CON) CR tablet 40 mEq (has no administration in time range)  HYDROmorphone (DILAUDID) injection 1 mg (has no administration in time range)  methocarbamol (ROBAXIN) tablet 500 mg (has no administration in time range)  HYDROmorphone (DILAUDID) injection 1 mg (1 mg Intravenous Given 05/11/20 2224)  ondansetron (ZOFRAN-ODT) disintegrating tablet 4 mg (4 mg Oral Given 05/11/20 2223)    ED Course  I have reviewed the triage vital signs and the nursing notes.  Pertinent labs & imaging results that were available during my care of the patient were reviewed by me and considered in my medical decision making (see chart for details).    MDM Rules/Calculators/A&P                          75 year old female who presents to the ER with complaints of worsening right-sided sciatica.  On arrival, she is very anxious appearing, rocking back and forth, states she is in severe pain.  Vitals on arrival overall reassuring.  No red flag signs on exam, moving all 4 extremities, intact strength and sensations in her lower extremities.  Imaging reviewed from December 2021 visit, with some arthritic changes and mild degenerative changes of the lumbar spine.  Plan for  some basic labs, UA to rule out electrolyte abnormalities or possible kidney stones.   Discussed with the patient that we do not place patients on morphine drips, however we will attempt to get her pain at least mildly under control.  She is agreeable to this.  Personally reviewed and interpreted her labs -CBC with leukocytosis of 11.6, hemoglobin 16.1 -CMP with a hyponatremia 123, potassium of 2.8, mildly elevated AST likely consistent with COVID-19.   Patient was also seen and evaluated by Dr. Sherry Ruffing.  The patient has not had any imaging of her lumbar spine in quite some time.  We will order CT of the lumbar spine to rule out any acute pathology, give her another dose of pain medicine.  Will repeat potassium, start fluids.  Gust her electrolyte findings with the patient, she would like to be admitted for further pain control and  rehydration.  Consulted hospitalist team for admission, CT pending.   Final Clinical Impression(s) / ED Diagnoses Final diagnoses:  Sciatica of right side  Hyponatremia  Hypokalemia    Rx / DC Orders ED Discharge Orders    None

## 2020-05-12 ENCOUNTER — Encounter (HOSPITAL_COMMUNITY): Payer: Self-pay | Admitting: Internal Medicine

## 2020-05-12 ENCOUNTER — Other Ambulatory Visit: Payer: Self-pay

## 2020-05-12 ENCOUNTER — Observation Stay (HOSPITAL_COMMUNITY): Payer: Medicare PPO

## 2020-05-12 DIAGNOSIS — Z6838 Body mass index (BMI) 38.0-38.9, adult: Secondary | ICD-10-CM

## 2020-05-12 DIAGNOSIS — I10 Essential (primary) hypertension: Secondary | ICD-10-CM

## 2020-05-12 DIAGNOSIS — M5430 Sciatica, unspecified side: Secondary | ICD-10-CM | POA: Diagnosis present

## 2020-05-12 DIAGNOSIS — E871 Hypo-osmolality and hyponatremia: Secondary | ICD-10-CM

## 2020-05-12 DIAGNOSIS — M5431 Sciatica, right side: Secondary | ICD-10-CM | POA: Diagnosis not present

## 2020-05-12 DIAGNOSIS — U071 COVID-19: Secondary | ICD-10-CM

## 2020-05-12 DIAGNOSIS — E039 Hypothyroidism, unspecified: Secondary | ICD-10-CM

## 2020-05-12 LAB — CBC
HCT: 42.4 % (ref 36.0–46.0)
Hemoglobin: 15.1 g/dL — ABNORMAL HIGH (ref 12.0–15.0)
MCH: 32.8 pg (ref 26.0–34.0)
MCHC: 35.6 g/dL (ref 30.0–36.0)
MCV: 92 fL (ref 80.0–100.0)
Platelets: 261 10*3/uL (ref 150–400)
RBC: 4.61 MIL/uL (ref 3.87–5.11)
RDW: 13.2 % (ref 11.5–15.5)
WBC: 8.8 10*3/uL (ref 4.0–10.5)
nRBC: 0 % (ref 0.0–0.2)

## 2020-05-12 LAB — BASIC METABOLIC PANEL
Anion gap: 13 (ref 5–15)
BUN: 14 mg/dL (ref 8–23)
CO2: 25 mmol/L (ref 22–32)
Calcium: 9.3 mg/dL (ref 8.9–10.3)
Chloride: 87 mmol/L — ABNORMAL LOW (ref 98–111)
Creatinine, Ser: 0.74 mg/dL (ref 0.44–1.00)
GFR, Estimated: >60 mL/min (ref >60)
Glucose, Bld: 123 mg/dL — ABNORMAL HIGH (ref 70–99)
Potassium: 2.8 mmol/L — ABNORMAL LOW (ref 3.5–5.1)
Sodium: 125 mmol/L — ABNORMAL LOW (ref 135–145)

## 2020-05-12 LAB — URINALYSIS, ROUTINE W REFLEX MICROSCOPIC
Bilirubin Urine: NEGATIVE
Glucose, UA: NEGATIVE mg/dL
Ketones, ur: NEGATIVE mg/dL
Leukocytes,Ua: NEGATIVE
Nitrite: NEGATIVE
Protein, ur: 30 mg/dL — AB
Specific Gravity, Urine: 1.019 (ref 1.005–1.030)
pH: 6 (ref 5.0–8.0)

## 2020-05-12 LAB — CORTISOL: Cortisol, Plasma: 15.8 ug/dL

## 2020-05-12 LAB — TROPONIN I (HIGH SENSITIVITY)
Troponin I (High Sensitivity): 7 ng/L (ref <18)
Troponin I (High Sensitivity): 8 ng/L (ref <18)

## 2020-05-12 LAB — TSH: TSH: 2.57 u[IU]/mL (ref 0.350–4.500)

## 2020-05-12 LAB — D-DIMER, QUANTITATIVE: D-Dimer, Quant: 1.36 ug/mL-FEU — ABNORMAL HIGH (ref 0.00–0.50)

## 2020-05-12 LAB — C-REACTIVE PROTEIN: CRP: 1 mg/dL — ABNORMAL HIGH (ref <1.0)

## 2020-05-12 MED ORDER — HYDROCODONE-ACETAMINOPHEN 5-325 MG PO TABS
1.0000 | ORAL_TABLET | Freq: Four times a day (QID) | ORAL | Status: DC | PRN
  Administered 2020-05-12 – 2020-05-16 (×9): 1 via ORAL
  Filled 2020-05-12 (×10): qty 1

## 2020-05-12 MED ORDER — TIZANIDINE HCL 4 MG PO TABS
2.0000 mg | ORAL_TABLET | Freq: Three times a day (TID) | ORAL | Status: DC | PRN
  Administered 2020-05-12 – 2020-05-18 (×11): 2 mg via ORAL
  Filled 2020-05-12 (×11): qty 1

## 2020-05-12 MED ORDER — DILTIAZEM HCL ER BEADS 240 MG PO CP24: 360.0000 mg | ORAL_CAPSULE | Freq: Every day | ORAL | Status: DC

## 2020-05-12 MED ORDER — SODIUM CHLORIDE 0.9 % IV SOLN: INTRAVENOUS | Status: DC | PRN

## 2020-05-12 MED ORDER — ASPIRIN EC 81 MG PO TBEC
81.0000 mg | DELAYED_RELEASE_TABLET | Freq: Every day | ORAL | Status: DC
  Administered 2020-05-13 – 2020-05-30 (×18): 81 mg via ORAL
  Filled 2020-05-12 (×18): qty 1

## 2020-05-12 MED ORDER — ENOXAPARIN SODIUM 40 MG/0.4ML ~~LOC~~ SOLN
40.0000 mg | SUBCUTANEOUS | Status: DC
  Administered 2020-05-12 – 2020-05-23 (×12): 40 mg via SUBCUTANEOUS
  Filled 2020-05-12 (×13): qty 0.4

## 2020-05-12 MED ORDER — FERROUS SULFATE 325 (65 FE) MG PO TABS
325.0000 mg | ORAL_TABLET | Freq: Every day | ORAL | Status: DC
  Administered 2020-05-13 – 2020-05-30 (×17): 325 mg via ORAL
  Filled 2020-05-12 (×17): qty 1

## 2020-05-12 MED ORDER — HYDROMORPHONE HCL 1 MG/ML IJ SOLN
0.5000 mg | INTRAMUSCULAR | Status: DC | PRN
  Administered 2020-05-12 – 2020-05-17 (×29): 0.5 mg via INTRAVENOUS
  Filled 2020-05-12 (×14): qty 0.5
  Filled 2020-05-12: qty 1
  Filled 2020-05-12 (×16): qty 0.5

## 2020-05-12 MED ORDER — SODIUM CHLORIDE 0.9 % IV SOLN
100.0000 mg | Freq: Every day | INTRAVENOUS | Status: AC
  Administered 2020-05-13 – 2020-05-16 (×4): 100 mg via INTRAVENOUS
  Filled 2020-05-12 (×4): qty 20

## 2020-05-12 MED ORDER — VITAMIN B-12 1000 MCG PO TABS
1000.0000 ug | ORAL_TABLET | Freq: Every day | ORAL | Status: DC
  Administered 2020-05-13 – 2020-05-30 (×18): 1000 ug via ORAL
  Filled 2020-05-12 (×18): qty 1

## 2020-05-12 MED ORDER — METHYLPREDNISOLONE SODIUM SUCC 40 MG IJ SOLR
40.0000 mg | Freq: Two times a day (BID) | INTRAMUSCULAR | Status: DC
  Administered 2020-05-12 – 2020-05-15 (×7): 40 mg via INTRAVENOUS
  Filled 2020-05-12 (×7): qty 1

## 2020-05-12 MED ORDER — ACETAMINOPHEN 650 MG RE SUPP: 650.0000 mg | Freq: Four times a day (QID) | RECTAL | Status: DC | PRN

## 2020-05-12 MED ORDER — SODIUM CHLORIDE 0.9 % IV SOLN
200.0000 mg | Freq: Once | INTRAVENOUS | Status: AC
  Administered 2020-05-12: 200 mg via INTRAVENOUS
  Filled 2020-05-12: qty 200

## 2020-05-12 MED ORDER — MUSCLE RUB 10-15 % EX CREA
TOPICAL_CREAM | CUTANEOUS | Status: DC | PRN
  Filled 2020-05-12: qty 85

## 2020-05-12 MED ORDER — HYDROMORPHONE HCL 1 MG/ML IJ SOLN
0.5000 mg | Freq: Once | INTRAMUSCULAR | Status: AC
  Administered 2020-05-12: 0.5 mg via INTRAVENOUS
  Filled 2020-05-12: qty 1

## 2020-05-12 MED ORDER — ACETAMINOPHEN 325 MG PO TABS
650.0000 mg | ORAL_TABLET | Freq: Four times a day (QID) | ORAL | Status: DC | PRN
  Administered 2020-05-13 – 2020-05-22 (×7): 650 mg via ORAL
  Filled 2020-05-12 (×7): qty 2

## 2020-05-12 MED ORDER — MAGNESIUM OXIDE 400 (241.3 MG) MG PO TABS
400.0000 mg | ORAL_TABLET | Freq: Two times a day (BID) | ORAL | Status: DC
  Administered 2020-05-12 – 2020-05-30 (×37): 400 mg via ORAL
  Filled 2020-05-12 (×39): qty 1

## 2020-05-12 MED ORDER — LEVOTHYROXINE SODIUM 25 MCG PO TABS
125.0000 ug | ORAL_TABLET | Freq: Every day | ORAL | Status: DC
  Administered 2020-05-13 – 2020-05-30 (×17): 125 ug via ORAL
  Filled 2020-05-12 (×18): qty 1

## 2020-05-12 MED ORDER — POTASSIUM CHLORIDE CRYS ER 20 MEQ PO TBCR
40.0000 meq | EXTENDED_RELEASE_TABLET | Freq: Two times a day (BID) | ORAL | Status: AC
  Administered 2020-05-12 (×2): 40 meq via ORAL
  Filled 2020-05-12 (×2): qty 2

## 2020-05-12 MED ORDER — LORAZEPAM 1 MG PO TABS
1.0000 mg | ORAL_TABLET | Freq: Once | ORAL | Status: AC
  Administered 2020-05-12: 1 mg via ORAL
  Filled 2020-05-12: qty 1

## 2020-05-12 MED ORDER — METOCLOPRAMIDE HCL 10 MG PO TABS: 5.0000 mg | ORAL_TABLET | Freq: Once | ORAL | Status: DC

## 2020-05-12 NOTE — Progress Notes (Addendum)
Same day note  Patient seen and examined at bedside.  Patient was admitted to the hospital for back and leg pain, shortness of breath  At the time of my evaluation, patient complains of back and leg pain  Physical examination reveals female in moderate distress with back tenderness.  Laboratory data and imaging was reviewed  Assessment and Plan.  COVID-19 infection.  Has mild shortness of breath.  On remdesivir.  Diagnosed 2-22.  Continue supportive care.  Trend inflammatory markers.  Not hypoxic so far.  COVID-19 Labs  Recent Labs    05/12/20 0552  DDIMER 1.36*  CRP 1.0*    Lab Results  Component Value Date   SARSCOV2NAA Not Detected 04/22/2020   SARSCOV2NAA Not Detected 03/16/2020   SARSCOV2NAA NEGATIVE 11/20/2018   [place] NEGATIVE 10/12/2018    Sciatica with right lower extremity pain.  Continue muscle relaxant, physical therapy consult, analgesia.  Patient states that she was supposed to follow-up with neurosurgery as outpatient.Add solu Medrol for now,on hydrocodone.  Dilaudid for severe pain.  Robaxin.  Hyponatremia.  Cortisol 15.8.  TSH 2.5.  Sodium significantly low at 125.  Continue to monitor closely.  Received normal saline.  Check BMP in a.m.  Hypokalemia.  2.8 on presentation.  Has been replenished.  Monitor in a.m.  Check magnesium levels as well.  Will give additional doses.  Hypothyroidism.  Continue Synthroid.  Essential hypertension.  Monitor blood pressure.  Continue Cardizem.  No Charge  Signed,  Delila Pereyra, MD [facility]

## 2020-05-12 NOTE — H&P (Signed)
History and Physical    Kayla [name] [id number] DOB: 1945-09-09 DOA: 05/11/2020  PCP: Nicolette Bang, DO    Patient coming from: Home.  Chief Complaint: Shortness of breath.  HPI: Kayla [name] is a 75 y.o. female with history of hypertension, hypothyroidism nonobstructive CAD presents to the ER with complaints of worsening low back pain radiating to right lower extremity over the last couple of days.  Patient was recently in the ER with symptoms of upper respiratory tract on was diagnosed on February 2 with Covid infection.  Subsequent which patient had followed up with monoclonal antibodies may have received antibiotics.  Patient states the pain is worse and denies any incontinence of urine or bowel.  Denies chest pain or shortness of breath.  ED Course: In the ER CT lumbar spine does not show anything acute.  Due to significant pain patient was given pain relief medication.  Labs show hyponatremia hypokalemia with sodium of 123 potassium of 2.8.  Patient admitted for further management of hyponatremia and hypokalemia.  On exam patient has significant pain on moving right lower extremity.  Review of Systems: As per HPI, rest all negative.   Past Medical History:  Diagnosis Date  . Anemia   . Anxiety   . Asthma   . B12 deficiency   . Bursitis   . Chronic LBP   . Chronic UTI   . Diastolic dysfunction   . Fibromyalgia   . GERD (gastroesophageal reflux disease)   . Hyperlipemia   . Hypertension   . Hypothyroid   . IBS (irritable bowel syndrome)   . Inflammatory osteoarthritis   . Kidney infection   . Psoriatic arthritis ([place])   . S/P cardiac cath 11/30/2007   normal coronaries - Dr. Burt Knack (Dr. Debara Pickett reviewed films on 01/10/2016)    Past Surgical History:  Procedure Laterality Date  . CESAREAN SECTION  06/10/1975  . CHOLECYSTECTOMY    . NECK SURGERY  1998  . repair broken C6 & C7       reports that she has never smoked. She has never used smokeless tobacco. She  reports that she does not drink alcohol and does not use drugs.  Allergies  Allergen Reactions  . Ace Inhibitors Anaphylaxis    REACTION: glossal edema  . Lisinopril Anaphylaxis  . Other Nausea Only    Family History  Problem Relation Age of Onset  . Heart attack Father 66  . Lung cancer Other        uncle, non smoker  . Leukemia Other        uncle  . Cancer Mother        oral  . Stroke Mother   . Alzheimer's disease Mother        at 28  . Cancer Maternal Aunt        breast  . Alcohol abuse Maternal Aunt   . Alcohol abuse Maternal Uncle   . Cancer Maternal Uncle        lung, stomach, oral  . Cancer Paternal Uncle        lung  . Congestive Heart Failure Maternal Grandfather   . Heart disease Maternal Grandfather   . Heart attack Paternal Grandfather   . Cancer Paternal Uncle        bone  . Brain cancer Maternal Grandmother   . Alzheimer's disease Other        maternal great grandmother   . Colon cancer Neg Hx   . Breast cancer  Neg Hx   . Diabetes Neg Hx     Prior to Admission medications   Medication Sig Start Date End Date Taking? Authorizing Provider  aspirin EC 81 MG tablet Take 1 tablet (81 mg total) by mouth daily. 12/15/18   Buford Dresser, MD  Calcium-Magnesium-Zinc (CAL-MAG-ZINC PO) Take by mouth 2 (two) times daily.    [provider]  cetirizine (ZYRTEC ALLERGY) 10 MG tablet Take 1 tablet (10 mg total) by mouth daily. 04/22/20   Hall-Potvin, [place], PA-C  chlorthalidone (HYGROTON) 25 MG tablet TAKE 1 TABLET BY MOUTH EVERY DAY 03/27/20   Buford Dresser, MD  clotrimazole (LOTRIMIN) 1 % cream Apply to affected area 2 times daily 03/16/20   Hall-Potvin, [place], PA-C  cyanocobalamin ([facility] VITAMIN B12) 1000 MCG tablet Take 1 tablet (1,000 mcg total) by mouth daily. 03/02/19   Ladell Pier, MD  diltiazem (TIAZAC) 360 MG 24 hr capsule Take 1 capsule (360 mg total) by mouth daily. 03/09/19   Buford Dresser, MD  ferrous sulfate  325 (65 FE) MG tablet ferrous sulfate 325 mg (65 mg iron) tablet  TAKE 1 TABLET BY MOUTH 2 TIMES DAILY WITH A MEAL    [provider]  fluticasone (FLONASE) 50 MCG/ACT nasal spray Place 1 spray into both nostrils daily. 04/22/20   Hall-Potvin, [place], PA-C  guaiFENesin (MUCINEX) 600 MG 12 hr tablet Take 1 tablet (600 mg total) by mouth 2 (two) times daily. 05/09/20 08/07/20  Mayers, Loraine Grip, PA-C  levothyroxine (SYNTHROID) 125 MCG tablet Take 1 tablet (125 mcg total) by mouth daily. 05/04/20 07/03/20  Mayers, Cari S, PA-C  lidocaine (LIDODERM) 5 % Place 1 patch onto the skin daily. Remove & Discard patch within 12 hours or as directed by MD 03/29/20   Horton, Alvin Critchley, DO  miconazole (MICOTIN) 2 % powder Apply topically as needed for itching. 03/16/20   Hall-Potvin, [place], PA-C  nystatin (MYCOSTATIN/NYSTOP) powder Apply 1 application topically 3 (three) times daily. 02/17/20   Nicolette Bang, DO  Omega-3 Fatty Acids (FISH OIL) 1000 MG CAPS Take by mouth. 01/15/16   [provider]  POTASSIUM CHLORIDE PO Take by mouth. Patient not taking: Reported on 05/03/2020    [provider]  predniSONE (DELTASONE) 20 MG tablet Take 3 tablets (60 mg total) by mouth daily with breakfast for 2 days, THEN 2 tablets (40 mg total) daily with breakfast for 2 days, THEN 1 tablet (20 mg total) daily with breakfast for 2 days, THEN 0.5 tablets (10 mg total) daily with breakfast for 2 days. 05/09/20 05/17/20  Mayers, Cari S, PA-C  pyridOXINE (VITAMIN B-6) 100 MG tablet Take 100 mg by mouth daily.    [provider]  tiZANidine (ZANAFLEX) 2 MG tablet Take 1 tablet (2 mg total) by mouth every 8 (eight) hours as needed for muscle spasms. 04/17/20   Camillia Herter, NP  triamcinolone (KENALOG) 0.1 % Apply 1 application topically 2 (two) times daily. 03/16/20   Hall-Potvin, [place], PA-C    Physical Exam: Constitutional: Moderately built and nourished. Vitals:   05/11/20 2249 05/11/20  2330 05/12/20 0300 05/12/20 0600  BP: (!) 139/93 (!) 151/111 137/88 (!) 144/91  Pulse: 83 89 75 82  Resp: 20 18 13 20   Temp:      TempSrc:      SpO2: 91% 93% 90% 96%  Weight:      Height:       Eyes: Anicteric no pallor. ENMT: No discharge from the ears eyes nose or  mouth. Neck: No mass felt.  No neck rigidity. Respiratory: No rhonchi or crepitations. Cardiovascular: S1-S2 heard. Abdomen: Soft nontender bowel sounds present. Musculoskeletal: Pain on moving right lower extremity. Skin: No rash. Neurologic: Alert awake oriented to time place and person.  Moves all extremities. Psychiatric: Appears normal.  Normal affect.   Labs on Admission: I have personally reviewed following labs and imaging studies  CBC: Recent Labs  Lab 05/11/20 2229  WBC 11.6*  NEUTROABS 10.5*  HGB 16.1*  HCT 44.5  MCV 90.8  PLT 834   Basic Metabolic Panel: Recent Labs  Lab 05/11/20 2229  NA 123*  K 2.8*  CL 86*  CO2 23  GLUCOSE 159*  BUN 16  CREATININE 0.89  CALCIUM 9.6   GFR: Estimated Creatinine Clearance: 62.9 mL/min (by C-G formula based on SCr of 0.89 mg/dL). Liver Function Tests: Recent Labs  Lab 05/11/20 2229  AST 48*  ALT 34  ALKPHOS 60  BILITOT 0.7  PROT 7.5  ALBUMIN 4.1   No results for input(s): LIPASE, AMYLASE in the last 168 hours. No results for input(s): AMMONIA in the last 168 hours. Coagulation Profile: No results for input(s): INR, PROTIME in the last 168 hours. Cardiac Enzymes: No results for input(s): CKTOTAL, CKMB, CKMBINDEX, TROPONINI in the last 168 hours. BNP (last 3 results) No results for input(s): PROBNP in the last 8760 hours. HbA1C: No results for input(s): HGBA1C in the last 72 hours. CBG: No results for input(s): GLUCAP in the last 168 hours. Lipid Profile: No results for input(s): CHOL, HDL, LDLCALC, TRIG, CHOLHDL, LDLDIRECT in the last 72 hours. Thyroid Function Tests: No results for input(s): TSH, T4TOTAL, FREET4, T3FREE, THYROIDAB in  the last 72 hours. Anemia Panel: No results for input(s): VITAMINB12, FOLATE, FERRITIN, TIBC, IRON, RETICCTPCT in the last 72 hours. Urine analysis:    Component Value Date/Time   COLORURINE YELLOW 05/12/2020 0552   APPEARANCEUR CLEAR 05/12/2020 0552   APPEARANCEUR Clear 07/08/2019 1531   LABSPEC 1.019 05/12/2020 0552   PHURINE 6.0 05/12/2020 0552   GLUCOSEU NEGATIVE 05/12/2020 0552   HGBUR SMALL (A) 05/12/2020 0552   HGBUR moderate 11/08/2008 1602   BILIRUBINUR NEGATIVE 05/12/2020 0552   BILIRUBINUR negative 05/04/2020 0929   BILIRUBINUR Negative 07/08/2019 1531   KETONESUR NEGATIVE 05/12/2020 0552   PROTEINUR 30 (A) 05/12/2020 0552   UROBILINOGEN 0.2 05/04/2020 0929   UROBILINOGEN 0.2 08/11/2017 1647   NITRITE NEGATIVE 05/12/2020 0552   LEUKOCYTESUR NEGATIVE 05/12/2020 0552   Sepsis Labs: @LABRCNTIP (procalcitonin:4,lacticidven:4) )No results found for this or any previous visit (from the past 240 hour(s)).   Radiological Exams on Admission: CT Lumbar Spine Wo Contrast  Result Date: 05/12/2020 CLINICAL DATA:  Low back pain with right sciatica.  COVID-19. EXAM: CT LUMBAR SPINE WITHOUT CONTRAST TECHNIQUE: Multidetector CT imaging of the lumbar spine was performed without intravenous contrast administration. Multiplanar CT image reconstructions were also generated. COMPARISON:  None. FINDINGS: Segmentation: Normal Alignment: Grade 1 anterolisthesis at L4-5 due to facet arthrosis. Vertebrae: No acute fracture or focal pathologic process. Paraspinal and other soft tissues: Incompletely visualized left infrahilar opacity. Disc levels: There is mild-to-moderate bilateral neural foraminal stenosis at L3-4 and L4-5. There is mild L3-4 and moderate L4-5 spinal canal stenosis. IMPRESSION: 1. No acute abnormality of the lumbar spine. 2. Grade 1 anterolisthesis at L4-5 due to facet arthrosis, which may be a source of local low back pain. 3. Mild L3-4 and moderate L4-5 spinal canal stenosis. 4.  Mild-to-moderate bilateral neural foraminal stenosis at L3-4 and L4-5.  5. Incompletely visualized left infrahilar opacity. Electronically Signed   By: Ulyses Jarred M.D.   On: 05/12/2020 01:30     Assessment/Plan Principal Problem:   Sciatica Active Problems:   Essential hypertension   Class 2 severe obesity due to excess calories with serious comorbidity and body mass index (BMI) of 38.0 to 38.9 in adult [facility])   Acquired hypothyroidism   COVID-19   Hyponatremia    1. Sciatica with pain radiating the right lower extremity we will keep patient on pain medication muscle relaxant and physical therapy consult. 2. COVID-19 infection presently not hypoxic.  Will check chest x-ray.  We will keep patient remdesivir 3 days.  Was diagnosed on May 10, 2020. 3. Hyponatremia and hypokalemia could be secondary to chlorthalidone.  Will hold chlorthalidone.  Patient received 1 L fluid bolus.  Further recommendation will be based on the sodium trends.  Check urine for sodium osmolality check TSH cortisol. 4. Hypothyroidism on Synthroid. 5. Hypertension we will keep patient on as needed IV hydralazine hold chlorthalidone and continue Cardizem. 6. Nonobstructive CAD denies any chest pain.   DVT prophylaxis: Lovenox. Code Status: Full code. Family Communication: Discussed with patient. Disposition Plan: To be determined. Consults called: Physical therapy. Admission status: Observation.   Rise Patience MD [facility] Pager [phone].  If 7PM-7AM, please contact night-coverage [email or web address] Password [email or web address]  05/12/2020, 6:30 AM

## 2020-05-12 NOTE — ED Notes (Signed)
Patient sleeping at this time, even and unlabored respirations. Will continue to monitor.

## 2020-05-12 NOTE — Evaluation (Signed)
Physical Therapy Evaluation Patient Details Name: Kayla [name] MRN: [id number] DOB: 1945/11/20 Today's Date: 05/12/2020   History of Present Illness  Pt is a 75 y.o. female with history of hypertension, hypothyroidism nonobstructive CAD presents to the ER with complaints of worsening low back pain radiating to right lower extremity over the last couple of days.  Patient was recently in the ER with symptoms of upper respiratory tract on was diagnosed on February 2 with Covid infection. Pt admitted for R LE pain/sciatica.  Clinical Impression   Pt admitted with above diagnosis. Pt admitted with COVID (although largely asymptomatic, resp status stable, some c/o fatigue) and severe R radiating LE pain/sciatica.  Pt with limited tolerance for activity during therapy due to pain.  She was able to transfer with mod A.  Limited testing due to pain.  Required increased time and cues for transfer to help control pain.  Pt lives with family but is normally independent.  If pain does not improve and pt able to mobilize more - will need SNF placement.  Pt currently with functional limitations due to the deficits listed below (see PT Problem List). Pt will benefit from skilled PT to increase their independence and safety with mobility to allow discharge to the venue listed below.       Follow Up Recommendations SNF    Equipment Recommendations  3in1 (PT)    Recommendations for Other Services       Precautions / Restrictions Precautions Precautions: Fall Precaution Comments: pain      Mobility  Bed Mobility Overal bed mobility: Needs Assistance Bed Mobility: Rolling;Sidelying to Sit;Sit to Sidelying Rolling: Min assist Sidelying to sit: Mod assist;HOB elevated     Sit to sidelying: Min assist;HOB elevated General bed mobility comments: cues for log roll; increased time for pain control; required assist with legs and trunk    Transfers Overall transfer level: Needs assistance Equipment used: 1  person hand held assist Transfers: Sit to/from [facility] Sit to Stand: Mod assist Stand pivot transfers: Min assist       General transfer comment: Did not have RW in room performed Stand pivot to and from [facility] with therapist standing in front of pt and pt holding therapist (also allowed therapist to provide improved guarding). Pt utilized bed rail for support at times  Ambulation/Gait Ambulation/Gait assistance: Mod assist Gait Distance (Feet): 1 Feet (1'x2) Assistive device: 1 person hand held assist Gait Pattern/deviations: Step-to pattern;Decreased stride length;Shuffle Gait velocity: decreased   General Gait Details: Small side steps up EOB with mod A for suppore  Stairs            Wheelchair Mobility    Modified Rankin (Stroke Patients Only)       Balance Overall balance assessment: Needs assistance Sitting-balance support: No upper extremity supported Sitting balance-Leahy Scale: Fair     Standing balance support: Bilateral upper extremity supported Standing balance-Leahy Scale: Poor                               Pertinent Vitals/Pain Pain Assessment: 0-10 Pain Score:  (5/10 most of the time but had random sharp pains to 10/10) Pain Location: R buttock to knee and calf Pain Descriptors / Indicators: Grimacing;Spasm;Stabbing;Sharp;Radiating;Crying;Other (Comment) (Symptoms when she had radiating pain.  Lasted for 10-30 seconds at a time.) Pain Intervention(s): Limited activity within patient's tolerance;Repositioned;Monitored during session;Relaxation;Other (comment) (pillow placed under R leg at rest; slow/careful transfers)  Home Living Family/patient expects to be discharged to:: Private residence Living Arrangements: Children Available Help at Discharge: Family;Other (Comment) (Reports available most of the time) Type of Home: House         Home Equipment: Walker - 2 wheels Additional Comments: Some limitations in  history due to pt very HOH.    Prior Function Level of Independence: Independent         Comments: Reports normally I with ambulation and ADLs.  Uses a RW when sciatica is flaired.  REports has had a couple flairs over the past few months but typically calms with Predinsone and is not this time.  Pt reports she was supposed to have referral to neurosurgeon.     Hand Dominance        Extremity/Trunk Assessment   Upper Extremity Assessment Upper Extremity Assessment: Generalized weakness    Lower Extremity Assessment Lower Extremity Assessment: Generalized weakness;RLE deficits/detail (ROM appears WFL UE and LE.  Did not MMT due to pain.  Did demonstrate at least 3/5 strength throughout.) RLE Deficits / Details: Pain with passive SLR    Cervical / Trunk Assessment Cervical / Trunk Assessment: Kyphotic;Other exceptions (forward head)  Communication   Communication: HOH (Very HOH, did some communication by writing)  Cognition Arousal/Alertness: Awake/alert Behavior During Therapy: WFL for tasks assessed/performed Overall Cognitive Status: Within Functional Limits for tasks assessed                                        General Comments General comments (skin integrity, edema, etc.): Pt with significant pain in R buttock, knee, calf at times.  These episodes that lasted 10-30 seconds were unpredictible and did not occur with certain positions.  She reports relief in past at times with lumbar flexion and Predinsone.  Limited testing due to severe pain and pt HOH having difficulty providing details.    Exercises     Assessment/Plan    PT Assessment Patient needs continued PT services  PT Problem List Decreased strength;Decreased mobility;Decreased safety awareness;Decreased range of motion;Decreased activity tolerance;Decreased balance;Decreased knowledge of use of DME;Pain       PT Treatment Interventions DME instruction;Therapeutic  activities;Modalities;Gait training;Therapeutic exercise;Patient/family education;Balance training;Functional mobility training    PT Goals (Current goals can be found in the Care Plan section)  Acute Rehab PT Goals Patient Stated Goal: decrease pain PT Goal Formulation: With patient Time For Goal Achievement: 05/26/20 Potential to Achieve Goals: Good Additional Goals Additional Goal #1: Will decrease pain to 3/10 to tolerate activity    Frequency Min 3X/week   Barriers to discharge        Co-evaluation               AM-PAC PT "6 Clicks" Mobility  Outcome Measure Help needed turning from your back to your side while in a flat bed without using bedrails?: A Lot Help needed moving from lying on your back to sitting on the side of a flat bed without using bedrails?: A Lot Help needed moving to and from a bed to a chair (including a wheelchair)?: A Lot Help needed standing up from a chair using your arms (e.g., wheelchair or bedside chair)?: A Lot Help needed to walk in hospital room?: A Lot Help needed climbing 3-5 steps with a railing? : Total 6 Click Score: 11    End of Session Equipment Utilized During Treatment: Gait belt Activity Tolerance: Patient  limited by pain Patient left: in bed;with call bell/phone within reach;with bed alarm set Nurse Communication: Mobility status PT Visit Diagnosis: Unsteadiness on feet (R26.81);Muscle weakness (generalized) (M62.81);Pain Pain - Right/Left: Right Pain - part of body: Leg    Time: [id number] PT Time Calculation (min) (ACUTE ONLY): 48 min   Charges:   PT Evaluation $PT Eval Moderate Complexity: 1 Mod PT Treatments $Therapeutic Activity: 23-37 mins        Abran Richard, PT Acute Rehab Services Pager [phone] [facility] [phone]    Karlton Lemon 05/12/2020, 6:02 PM

## 2020-05-13 DIAGNOSIS — M4316 Spondylolisthesis, lumbar region: Secondary | ICD-10-CM | POA: Diagnosis present

## 2020-05-13 DIAGNOSIS — L4050 Arthropathic psoriasis, unspecified: Secondary | ICD-10-CM | POA: Diagnosis present

## 2020-05-13 DIAGNOSIS — I10 Essential (primary) hypertension: Secondary | ICD-10-CM | POA: Diagnosis not present

## 2020-05-13 DIAGNOSIS — M5126 Other intervertebral disc displacement, lumbar region: Secondary | ICD-10-CM | POA: Diagnosis present

## 2020-05-13 DIAGNOSIS — K589 Irritable bowel syndrome without diarrhea: Secondary | ICD-10-CM | POA: Diagnosis present

## 2020-05-13 DIAGNOSIS — M797 Fibromyalgia: Secondary | ICD-10-CM | POA: Diagnosis present

## 2020-05-13 DIAGNOSIS — M48061 Spinal stenosis, lumbar region without neurogenic claudication: Secondary | ICD-10-CM | POA: Diagnosis present

## 2020-05-13 DIAGNOSIS — M5430 Sciatica, unspecified side: Secondary | ICD-10-CM | POA: Diagnosis present

## 2020-05-13 DIAGNOSIS — Z888 Allergy status to other drugs, medicaments and biological substances status: Secondary | ICD-10-CM | POA: Diagnosis not present

## 2020-05-13 DIAGNOSIS — M5440 Lumbago with sciatica, unspecified side: Secondary | ICD-10-CM | POA: Diagnosis present

## 2020-05-13 DIAGNOSIS — M4120 Other idiopathic scoliosis, site unspecified: Secondary | ICD-10-CM | POA: Diagnosis present

## 2020-05-13 DIAGNOSIS — E039 Hypothyroidism, unspecified: Secondary | ICD-10-CM | POA: Diagnosis present

## 2020-05-13 DIAGNOSIS — M5431 Sciatica, right side: Secondary | ICD-10-CM | POA: Diagnosis not present

## 2020-05-13 DIAGNOSIS — M25561 Pain in right knee: Secondary | ICD-10-CM | POA: Diagnosis present

## 2020-05-13 DIAGNOSIS — K219 Gastro-esophageal reflux disease without esophagitis: Secondary | ICD-10-CM | POA: Diagnosis present

## 2020-05-13 DIAGNOSIS — U071 COVID-19: Secondary | ICD-10-CM | POA: Diagnosis present

## 2020-05-13 DIAGNOSIS — Z79890 Hormone replacement therapy (postmenopausal): Secondary | ICD-10-CM | POA: Diagnosis not present

## 2020-05-13 DIAGNOSIS — E785 Hyperlipidemia, unspecified: Secondary | ICD-10-CM | POA: Diagnosis present

## 2020-05-13 DIAGNOSIS — Z79899 Other long term (current) drug therapy: Secondary | ICD-10-CM | POA: Diagnosis not present

## 2020-05-13 DIAGNOSIS — Z9049 Acquired absence of other specified parts of digestive tract: Secondary | ICD-10-CM | POA: Diagnosis not present

## 2020-05-13 DIAGNOSIS — R296 Repeated falls: Secondary | ICD-10-CM | POA: Diagnosis present

## 2020-05-13 DIAGNOSIS — J45909 Unspecified asthma, uncomplicated: Secondary | ICD-10-CM | POA: Diagnosis present

## 2020-05-13 DIAGNOSIS — E876 Hypokalemia: Secondary | ICD-10-CM | POA: Diagnosis present

## 2020-05-13 DIAGNOSIS — I2510 Atherosclerotic heart disease of native coronary artery without angina pectoris: Secondary | ICD-10-CM | POA: Diagnosis present

## 2020-05-13 DIAGNOSIS — N179 Acute kidney failure, unspecified: Secondary | ICD-10-CM | POA: Diagnosis not present

## 2020-05-13 DIAGNOSIS — F419 Anxiety disorder, unspecified: Secondary | ICD-10-CM | POA: Diagnosis present

## 2020-05-13 DIAGNOSIS — Z7982 Long term (current) use of aspirin: Secondary | ICD-10-CM | POA: Diagnosis not present

## 2020-05-13 LAB — CBC WITH DIFFERENTIAL/PLATELET
Abs Immature Granulocytes: 0.12 10*3/uL — ABNORMAL HIGH (ref 0.00–0.07)
Basophils Absolute: 0 10*3/uL (ref 0.0–0.1)
Basophils Relative: 0 %
Eosinophils Absolute: 0 10*3/uL (ref 0.0–0.5)
Eosinophils Relative: 0 %
HCT: 43.9 % (ref 36.0–46.0)
Hemoglobin: 14.8 g/dL (ref 12.0–15.0)
Immature Granulocytes: 1 %
Lymphocytes Relative: 8 %
Lymphs Abs: 0.8 10*3/uL (ref 0.7–4.0)
MCH: 32.2 pg (ref 26.0–34.0)
MCHC: 33.7 g/dL (ref 30.0–36.0)
MCV: 95.4 fL (ref 80.0–100.0)
Monocytes Absolute: 0.3 10*3/uL (ref 0.1–1.0)
Monocytes Relative: 3 %
Neutro Abs: 8.6 10*3/uL — ABNORMAL HIGH (ref 1.7–7.7)
Neutrophils Relative %: 88 %
Platelets: 241 10*3/uL (ref 150–400)
RBC: 4.6 MIL/uL (ref 3.87–5.11)
RDW: 13.2 % (ref 11.5–15.5)
WBC: 9.8 10*3/uL (ref 4.0–10.5)
nRBC: 0 % (ref 0.0–0.2)

## 2020-05-13 LAB — COMPREHENSIVE METABOLIC PANEL
ALT: 29 U/L (ref 0–44)
AST: 32 U/L (ref 15–41)
Albumin: 3.5 g/dL (ref 3.5–5.0)
Alkaline Phosphatase: 49 U/L (ref 38–126)
Anion gap: 10 (ref 5–15)
BUN: 16 mg/dL (ref 8–23)
CO2: 22 mmol/L (ref 22–32)
Calcium: 9 mg/dL (ref 8.9–10.3)
Chloride: 90 mmol/L — ABNORMAL LOW (ref 98–111)
Creatinine, Ser: 0.65 mg/dL (ref 0.44–1.00)
GFR, Estimated: >60 mL/min (ref >60)
Glucose, Bld: 123 mg/dL — ABNORMAL HIGH (ref 70–99)
Potassium: 3.5 mmol/L (ref 3.5–5.1)
Sodium: 122 mmol/L — ABNORMAL LOW (ref 135–145)
Total Bilirubin: 0.7 mg/dL (ref 0.3–1.2)
Total Protein: 6.5 g/dL (ref 6.5–8.1)

## 2020-05-13 LAB — D-DIMER, QUANTITATIVE: D-Dimer, Quant: 1.45 ug/mL-FEU — ABNORMAL HIGH (ref 0.00–0.50)

## 2020-05-13 LAB — C-REACTIVE PROTEIN: CRP: 0.7 mg/dL (ref <1.0)

## 2020-05-13 LAB — PHOSPHORUS: Phosphorus: 2.8 mg/dL (ref 2.5–4.6)

## 2020-05-13 LAB — MAGNESIUM: Magnesium: 2 mg/dL (ref 1.7–2.4)

## 2020-05-13 MED ORDER — LIP MEDEX EX OINT
TOPICAL_OINTMENT | CUTANEOUS | Status: DC | PRN
  Filled 2020-05-13: qty 7

## 2020-05-13 MED ORDER — ONDANSETRON HCL 4 MG/2ML IJ SOLN
4.0000 mg | Freq: Four times a day (QID) | INTRAMUSCULAR | Status: DC | PRN
  Administered 2020-05-13: 4 mg via INTRAVENOUS
  Filled 2020-05-13: qty 2

## 2020-05-13 NOTE — Progress Notes (Signed)
RN notified via secure chat from LCSW that pt reported being soiled in urine for 2 hours and that nobody has come to help her. RN went to room to check on the pt. The pt reported c/o pain and asked for pain medication.  The RN found the other findings of the soiled linens to not be true. The RN and NT have been in and out of the pt's room throughout the day to ensure that she is not soiled in urine. Monitoring continues.

## 2020-05-13 NOTE — TOC Initial Note (Signed)
Transition of Care [facility]) - Initial/Assessment Note    Patient Details  Name: Kayla [name] MRN: [id number] Date of Birth: Jan 21, 1946  Transition [facility] Care [facility] CM/SW Contact:    Servando Snare, LCSW Phone Number: 05/13/2020, 3:06 PM  Clinical Narrative:      Patient is from home where she lives with her adult son and 3 grandchildren. Patient is agreeable to SNF but has some apprehension as she lives on a fixed income and is the primary caregiver for her son and his children.   Patient gives permission to be faxed out. Patient also complained of soiled linens. LCSW notified RN and NT via secure chat of patient complaints.            Expected Discharge Plan: Skilled Nursing Facility Barriers to Discharge: Continued Medical Work up   Patient Goals and CMS Choice Patient states their goals for this hospitalization and ongoing recovery are:: Go home. CMS Medicare.gov Compare Post Acute Care list provided to:: Patient Choice offered to / list presented to : Patient  Expected Discharge Plan and Services Expected Discharge Plan: [place] In-house Referral: NA Discharge Planning Services: NA Post Acute Care Choice: [place] Living arrangements for the past 2 months: Single Family Home                 DME Arranged: N/A DME Agency: NA       HH Arranged: NA HH Agency: NA        Prior Living Arrangements/Services Living arrangements for the past 2 months: Single Family Home Lives with:: Adult Children Patient language and need for interpreter reviewed:: Yes Do you feel safe going back to the place where you live?: Yes      Need for Family Participation in Patient Care: Yes (Comment) Care giver support system in place?: Yes (comment)   Criminal Activity/Legal Involvement Pertinent to Current Situation/Hospitalization: No - Comment as needed  Activities of Daily Living Home Assistive Devices/Equipment: Eyeglasses ADL Screening (condition at time of  admission) Patient's cognitive ability adequate to safely complete daily activities?: Yes Is the patient deaf or have difficulty hearing?: No Does the patient have difficulty seeing, even when wearing glasses/contacts?: No Does the patient have difficulty concentrating, remembering, or making decisions?: No Patient able to express need for assistance with ADLs?: Yes Does the patient have difficulty dressing or bathing?: No Independently performs ADLs?: Yes (appropriate for developmental age) Does the patient have difficulty walking or climbing stairs?: Yes (secondary to back pain and right leg pain) Weakness of Legs: Both Weakness of Arms/Hands: None  Permission Sought/Granted                  Emotional Assessment Appearance:: Appears stated age Attitude/Demeanor/Rapport: Apprehensive Affect (typically observed): Adaptable Orientation: : Oriented to Self,Oriented to Place,Oriented to  Time,Oriented to Situation Alcohol / Substance Use: Not Applicable Psych Involvement: No (comment)  Admission diagnosis:  Sciatica [M54.30] Hypokalemia [E87.6] Hyponatremia [E87.1] Sciatica of right side [M54.31] COVID-19 virus infection [U07.1] Patient Active Problem List   Diagnosis Date Noted  . Hyponatremia 05/12/2020  . Sciatica 05/12/2020  . COVID-19 05/09/2020  . Hypokalemia 05/04/2020  . Dysphagia 04/17/2020  . Renal insufficiency 12/29/2018  . Cyst of left ovary 12/29/2018  . Esophageal dysmotility 12/29/2018  . 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 Acquired hypothyroidism 12/29/2018  . Chronic maxillary sinusitis 07/07/2018  . Snoring 06/01/2018  . Costochondritis 06/01/2018  . Family history of heart disease 06/01/2018  . Class 2 severe  obesity due to excess calories with serious comorbidity and body mass index (BMI) of 38.0 to 38.9 in adult [facility]) 06/01/2018  . Multiple atypical skin moles 04/28/2017  . Depression, recurrent ([place]) 07/16/2016  .  Adjustment disorder with mixed anxiety and depressed mood 01/15/2016  . Fe Def Anemia 12/22/2015  . Scoliosis (and kyphoscoliosis), idiopathic 12/20/2015  . Osteopenia 12/20/2015  . Fibromyalgia 08/02/2014  . GAD (generalized anxiety disorder) 09/17/2012  . Vitamin D deficiency 01/24/2012  . Fatigue 10/14/2011  . B12 deficiency   . Hyperlipidemia 11/30/2007  . Coronary artery disease involving native heart without angina pectoris 11/30/2007  . Essential hypertension 01/27/2007  . Reactive airway disease that is not asthma 01/27/2007  . GERD 01/27/2007  . IBS 01/27/2007   PCP:  Nicolette Bang, DO Pharmacy:   [id number] - [place], [place] [address] Phone: [phone] Fax: [phone]     Social Determinants of Health (SDOH) Interventions    Readmission Risk Interventions No flowsheet data found.

## 2020-05-13 NOTE — Progress Notes (Addendum)
PROGRESS NOTE  Kayla [name] [id number] DOB: 1946/02/08 DOA: 05/11/2020 PCP: Nicolette Bang, DO  HPI/Recap of past 24 hours: Kayla [name] is a 75 year old female who has history of hypertension hypothyroidism nonobstructive coronary artery disease obesity who was admitted to the hospital for shortness of breath and worsening low back pain radiating to her right lower, extremity  Subjective: May 13, 2020 Patient seen and examined at bedside still complaining of pain in her lumbar area.  She is requesting to have for an MRI  Assessment/Plan: Principal Problem:   Sciatica Active Problems:   Essential hypertension   Class 2 severe obesity due to excess calories with serious comorbidity and body mass index (BMI) of 38.0 to 38.9 in adult [facility])   Acquired hypothyroidism   COVID-19   Hyponatremia  #1 sciatica with pain radiating to the right lower extremity.  Continue pain management patient does have a follow-up appointment with neurosurgery she would do that on discharge.  2.  COVID-19 infection patient 3 nonhypoxic on room air.  She was started on remdesivir May 12, 2018 says she has received a second dose today she will receive her third dose of likely discharge tomorrow for total of 3 doses  3.  Hyponatremia and hypokalemia.  Potassium is still low it is 122 today.   Patient is on chlorthalidone which might be causing the electrolyte imbalance.  Chlorthalidone is on hold she received IV bolus in the ED. Will replace with IV saline.  4. hypertension currently stable with chlorthalidone is on hold due to electrolyte imbalance.  Continue IV hydralazine as needed as well as Cardizem.  5.  Nonobstructive coronary disease patient does not have chest pain at this time we will continue to monitor  Code Status: Full  Severity of Illness: The appropriate patient status for this patient is INPATIENT. Inpatient status is judged to be reasonable and necessary in order to provide  the required intensity of service to ensure the patient's safety. The patient's presenting symptoms, physical exam findings, and initial radiographic and laboratory data in the context of their chronic comorbidities is felt to place them at high risk for further clinical deterioration. Furthermore, it is not anticipated that the patient will be medically stable for discharge from the hospital within 2 midnights of admission. The following factors support the patient status of inpatient.   " The patient's presenting symptoms include COVID-19. " The worrisome physical exam findings include COVID-19 infection. " The initial radiographic and laboratory data are worrisome because of COVID-19 positive and hyponatremia requiring IV infusion.  " The chronic co-morbidities include hypertension severe lumbar radiculopathy with pain   * I certify that at the point of admission it is my clinical judgment that the patient will require inpatient hospital care spanning beyond 2 midnights from the point of admission due to high intensity of service, high risk for further deterioration and high frequency of surveillance required.*    Family Communication: None  Disposition Plan: Home in 1 to 2 days after remdesivir   Consultants:  Physical therapy  Procedures:  None  Antimicrobials:  None  DVT prophylaxis: Lovenox   Objective: Vitals:   05/12/20 1517 05/12/20 1941 05/12/20 2325 05/13/20 0316  BP:  (!) 140/98 (!) 148/92 116/82  Pulse:  80 74 67  Resp:  20 18 20   Temp:  99.1 F (37.3 C) 97.8 F (36.6 C) 98.8 F (37.1 C)  TempSrc:  Oral    SpO2: 97% 97% 98% 94%  Weight:  Height:       No intake or output data in the 24 hours ending 05/13/20 1448 Filed Weights   05/11/20 2102  Weight: 97.5 kg   Body mass index is 36.9 kg/m.  Exam:  . General: 75 y.o. year-old female well developed well nourished in no acute distress.  Alert and oriented x3.  Overweight . Cardiovascular:  Regular rate and rhythm with no rubs or gallops.  No thyromegaly or JVD noted.   Marland Kitchen Respiratory: Clear to auscultation with no wheezes or rales. Good inspiratory effort. . Abdomen: Soft nontender nondistended with normal bowel sounds x4 quadrants. . Musculoskeletal: No lower extremity edema. 2/4 pulses in all 4 extremities.  Gait was not assessed. . Skin: No ulcerative lesions noted or rashes, . Psychiatry: Mood is appropriate for condition and setting    Data Reviewed: CBC: Recent Labs  Lab 05/11/20 2229 05/12/20 0552 05/13/20 0427  WBC 11.6* 8.8 9.8  NEUTROABS 10.5*  --  8.6*  HGB 16.1* 15.1* 14.8  HCT 44.5 42.4 43.9  MCV 90.8 92.0 95.4  PLT 270 261 767   Basic Metabolic Panel: Recent Labs  Lab 05/11/20 2229 05/12/20 0552 05/13/20 0427  NA 123* 125* 122*  K 2.8* 2.8* 3.5  CL 86* 87* 90*  CO2 23 25 22   GLUCOSE 159* 123* 123*  BUN 16 14 16   CREATININE 0.89 0.74 0.65  CALCIUM 9.6 9.3 9.0  MG  --   --  2.0  PHOS  --   --  2.8   GFR: Estimated Creatinine Clearance: 69.9 mL/min (by C-G formula based on SCr of 0.65 mg/dL). Liver Function Tests: Recent Labs  Lab 05/11/20 2229 05/13/20 0427  AST 48* 32  ALT 34 29  ALKPHOS 60 49  BILITOT 0.7 0.7  PROT 7.5 6.5  ALBUMIN 4.1 3.5   No results for input(s): LIPASE, AMYLASE in the last 168 hours. No results for input(s): AMMONIA in the last 168 hours. Coagulation Profile: No results for input(s): INR, PROTIME in the last 168 hours. Cardiac Enzymes: No results for input(s): CKTOTAL, CKMB, CKMBINDEX, TROPONINI in the last 168 hours. BNP (last 3 results) No results for input(s): PROBNP in the last 8760 hours. HbA1C: No results for input(s): HGBA1C in the last 72 hours. CBG: No results for input(s): GLUCAP in the last 168 hours. Lipid Profile: No results for input(s): CHOL, HDL, LDLCALC, TRIG, CHOLHDL, LDLDIRECT in the last 72 hours. Thyroid Function Tests: Recent Labs    05/12/20 0552  TSH 2.570   Anemia  Panel: No results for input(s): VITAMINB12, FOLATE, FERRITIN, TIBC, IRON, RETICCTPCT in the last 72 hours. Urine analysis:    Component Value Date/Time   COLORURINE YELLOW 05/12/2020 0552   APPEARANCEUR CLEAR 05/12/2020 0552   APPEARANCEUR Clear 07/08/2019 1531   LABSPEC 1.019 05/12/2020 0552   PHURINE 6.0 05/12/2020 0552   GLUCOSEU NEGATIVE 05/12/2020 0552   HGBUR SMALL (A) 05/12/2020 0552   HGBUR moderate 11/08/2008 1602   BILIRUBINUR NEGATIVE 05/12/2020 0552   BILIRUBINUR negative 05/04/2020 0929   BILIRUBINUR Negative 07/08/2019 1531   KETONESUR NEGATIVE 05/12/2020 0552   PROTEINUR 30 (A) 05/12/2020 0552   UROBILINOGEN 0.2 05/04/2020 0929   UROBILINOGEN 0.2 08/11/2017 1647   NITRITE NEGATIVE 05/12/2020 0552   LEUKOCYTESUR NEGATIVE 05/12/2020 0552   Sepsis Labs: @LABRCNTIP (procalcitonin:4,lacticidven:4)  )No results found for this or any previous visit (from the past 240 hour(s)).    Studies: No results found.  Scheduled Meds: . aspirin EC  81 mg Oral Daily  .  enoxaparin (LOVENOX) injection  40 mg Subcutaneous Q24H  . ferrous sulfate  325 mg Oral Q breakfast  . levothyroxine  125 mcg Oral Q0600  . magnesium oxide  400 mg Oral BID  . methylPREDNISolone (SOLU-MEDROL) injection  40 mg Intravenous Q12H  . cyanocobalamin  1,000 mcg Oral Daily    Continuous Infusions: . sodium chloride 10 mL/hr at 05/12/20 2018  . remdesivir 100 mg in NS 100 mL 100 mg (05/13/20 1122)     LOS: 0 days     Cristal Deer, MD [facility]  To reach me or the doctor on call, go to: [email or web address] Password [facility]  05/13/2020, 2:48 PM

## 2020-05-13 NOTE — NC FL2 (Signed)
[place] MEDICAID FL2 LEVEL OF CARE SCREENING TOOL     IDENTIFICATION  Patient Name: Kayla [name] Birthdate: [birth year] Sex: female Admission Date (Current Location): 05/11/2020  [facility] and [place] Number:  Herbalist and Address:  [facility],  [place] [address] [place]      Provider Number: [id number]  Attending Physician Name and Address:  Cristal Deer, MD  Relative Name and Phone Number:       Current Level of Care: Hospital Recommended Level of Care: [place] Prior Approval Number:    Date Approved/Denied:   PASRR Number:   [id number]  Discharge Plan: SNF    Current Diagnoses: Patient Active Problem List   Diagnosis Date Noted  . Hyponatremia 05/12/2020  . Sciatica 05/12/2020  . COVID-19 05/09/2020  . Hypokalemia 05/04/2020  . Dysphagia 04/17/2020  . Renal insufficiency 12/29/2018  . Cyst of left ovary 12/29/2018  . Esophageal dysmotility 12/29/2018  . 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 Acquired hypothyroidism 12/29/2018  . Chronic maxillary sinusitis 07/07/2018  . Snoring 06/01/2018  . Costochondritis 06/01/2018  . Family history of heart disease 06/01/2018  . Class 2 severe obesity due to excess calories with serious comorbidity and body mass index (BMI) of 38.0 to 38.9 in adult ([place]) 06/01/2018  . Multiple atypical skin moles 04/28/2017  . Depression, recurrent ([place]) 07/16/2016  . Adjustment disorder with mixed anxiety and depressed mood 01/15/2016  . Fe Def Anemia 12/22/2015  . Scoliosis (and kyphoscoliosis), idiopathic 12/20/2015  . Osteopenia 12/20/2015  . Fibromyalgia 08/02/2014  . GAD (generalized anxiety disorder) 09/17/2012  . Vitamin D deficiency 01/24/2012  . Fatigue 10/14/2011  . B12 deficiency   . Hyperlipidemia 11/30/2007  . Coronary artery disease involving native heart without angina pectoris 11/30/2007  . Essential hypertension 01/27/2007  . Reactive  airway disease that is not asthma 01/27/2007  . GERD 01/27/2007  . IBS 01/27/2007    Orientation RESPIRATION BLADDER Height & Weight     Self,Time,Situation,Place  Normal Incontinent Weight: 215 lb (97.5 kg) Height:  5\' 4"  (162.6 cm)  BEHAVIORAL SYMPTOMS/MOOD NEUROLOGICAL BOWEL NUTRITION STATUS      Continent Diet (see dc summary)  AMBULATORY STATUS COMMUNICATION OF NEEDS Skin   Extensive Assist Verbally Normal                       Personal Care Assistance Level of Assistance  Bathing,Feeding,Dressing Bathing Assistance: Limited assistance Feeding assistance: Independent Dressing Assistance: Limited assistance     Functional Limitations Info  Sight,Hearing,Speech Sight Info: Adequate Hearing Info: Impaired (L/R ear) Speech Info: Adequate    SPECIAL CARE FACTORS FREQUENCY  PT (By licensed PT)     PT Frequency: 5x week              Contractures Contractures Info: Not present    Additional Factors Info  Code Status,Allergies Code Status Info: full Allergies Info: : Ace Inhibitors, Lisinopril           Current Medications (05/13/2020):  This is the current hospital active medication list Current Facility-Administered Medications  Medication Dose Route Frequency Provider Last Rate Last Admin  . 0.9 %  sodium chloride infusion   Intravenous PRN Pokhrel, Laxman, MD 10 mL/hr at 05/12/20 2018 IV Pump Association at 05/12/20 2018  . acetaminophen (TYLENOL) tablet 650 mg  650 mg Oral Q6H PRN Rise Patience, MD   650 mg at 05/13/20 0211   Or  . acetaminophen (TYLENOL)  suppository 650 mg  650 mg Rectal Q6H PRN Rise Patience, MD      . aspirin EC tablet 81 mg  81 mg Oral Daily Rise Patience, MD   81 mg at 05/13/20 1117  . enoxaparin (LOVENOX) injection 40 mg  40 mg Subcutaneous Q24H Rise Patience, MD   40 mg at 05/13/20 1117  . ferrous sulfate tablet 325 mg  325 mg Oral Q breakfast Rise Patience, MD   325 mg at 05/13/20 1117  .  HYDROcodone-acetaminophen (NORCO/VICODIN) 5-325 MG per tablet 1 tablet  1 tablet Oral Q6H PRN Pokhrel, Laxman, MD   1 tablet at 05/12/20 2243  . HYDROmorphone (DILAUDID) injection 0.5 mg  0.5 mg Intravenous Q2H PRN Rise Patience, MD   0.5 mg at 05/13/20 1139  . levothyroxine (SYNTHROID) tablet 125 mcg  125 mcg Oral Q0600 Rise Patience, MD   125 mcg at 05/13/20 4818  . magnesium oxide (MAG-OX) tablet 400 mg  400 mg Oral BID Pokhrel, Laxman, MD   400 mg at 05/13/20 1117  . methylPREDNISolone sodium succinate (SOLU-MEDROL) 40 mg/mL injection 40 mg  40 mg Intravenous Q12H Pokhrel, Laxman, MD   40 mg at 05/13/20 1119  . Muscle Rub CREA   Topical PRN Flora Lipps, MD   Given at 05/13/20 0126  . ondansetron (ZOFRAN) injection 4 mg  4 mg Intravenous Q6H PRN Hollace Hayward K, NP   4 mg at 05/13/20 0121  . remdesivir 100 mg in sodium chloride 0.9 % 100 mL IVPB  100 mg Intravenous Daily Rise Patience, MD 200 mL/hr at 05/13/20 1122 100 mg at 05/13/20 1122  . tiZANidine (ZANAFLEX) tablet 2 mg  2 mg Oral Q8H PRN Rise Patience, MD   2 mg at 05/12/20 2327  . vitamin B-12 (CYANOCOBALAMIN) tablet 1,000 mcg  1,000 mcg Oral Daily Rise Patience, MD   1,000 mcg at 05/13/20 1117     Discharge Medications: Please see discharge summary for a list of discharge medications.  Relevant Imaging Results:  Relevant Lab Results:   Additional Information HUD:[id number]  Servando Snare, LCSW

## 2020-05-14 DIAGNOSIS — E039 Hypothyroidism, unspecified: Secondary | ICD-10-CM | POA: Diagnosis not present

## 2020-05-14 DIAGNOSIS — M5431 Sciatica, right side: Secondary | ICD-10-CM | POA: Diagnosis not present

## 2020-05-14 DIAGNOSIS — U071 COVID-19: Secondary | ICD-10-CM | POA: Diagnosis not present

## 2020-05-14 LAB — CBC WITH DIFFERENTIAL/PLATELET
Abs Immature Granulocytes: 0.05 10*3/uL (ref 0.00–0.07)
Basophils Absolute: 0 10*3/uL (ref 0.0–0.1)
Basophils Relative: 0 %
Eosinophils Absolute: 0 10*3/uL (ref 0.0–0.5)
Eosinophils Relative: 0 %
HCT: 42.4 % (ref 36.0–46.0)
Hemoglobin: 14.6 g/dL (ref 12.0–15.0)
Immature Granulocytes: 1 %
Lymphocytes Relative: 9 %
Lymphs Abs: 0.9 10*3/uL (ref 0.7–4.0)
MCH: 32.7 pg (ref 26.0–34.0)
MCHC: 34.4 g/dL (ref 30.0–36.0)
MCV: 94.9 fL (ref 80.0–100.0)
Monocytes Absolute: 0.4 10*3/uL (ref 0.1–1.0)
Monocytes Relative: 4 %
Neutro Abs: 8.9 10*3/uL — ABNORMAL HIGH (ref 1.7–7.7)
Neutrophils Relative %: 86 %
Platelets: 311 10*3/uL (ref 150–400)
RBC: 4.47 MIL/uL (ref 3.87–5.11)
RDW: 13.4 % (ref 11.5–15.5)
WBC: 10.3 10*3/uL (ref 4.0–10.5)
nRBC: 0 % (ref 0.0–0.2)

## 2020-05-14 LAB — COMPREHENSIVE METABOLIC PANEL
ALT: 29 U/L (ref 0–44)
ALT: 37 U/L (ref 0–44)
AST: 30 U/L (ref 15–41)
AST: 36 U/L (ref 15–41)
Albumin: 3.4 g/dL — ABNORMAL LOW (ref 3.5–5.0)
Albumin: 3.8 g/dL (ref 3.5–5.0)
Alkaline Phosphatase: 52 U/L (ref 38–126)
Alkaline Phosphatase: 57 U/L (ref 38–126)
Anion gap: 11 (ref 5–15)
Anion gap: 10 (ref 5–15)
BUN: 24 mg/dL — ABNORMAL HIGH (ref 8–23)
BUN: 26 mg/dL — ABNORMAL HIGH (ref 8–23)
CO2: 27 mmol/L (ref 22–32)
CO2: 27 mmol/L (ref 22–32)
Calcium: 9.6 mg/dL (ref 8.9–10.3)
Calcium: 9.8 mg/dL (ref 8.9–10.3)
Chloride: 93 mmol/L — ABNORMAL LOW (ref 98–111)
Chloride: 96 mmol/L — ABNORMAL LOW (ref 98–111)
Creatinine, Ser: 0.9 mg/dL (ref 0.44–1.00)
Creatinine, Ser: 0.88 mg/dL (ref 0.44–1.00)
GFR, Estimated: >60 mL/min (ref >60)
GFR, Estimated: >60 mL/min (ref >60)
Glucose, Bld: 140 mg/dL — ABNORMAL HIGH (ref 70–99)
Glucose, Bld: 146 mg/dL — ABNORMAL HIGH (ref 70–99)
Potassium: 4 mmol/L (ref 3.5–5.1)
Potassium: 4 mmol/L (ref 3.5–5.1)
Sodium: 131 mmol/L — ABNORMAL LOW (ref 135–145)
Sodium: 133 mmol/L — ABNORMAL LOW (ref 135–145)
Total Bilirubin: 0.7 mg/dL (ref 0.3–1.2)
Total Bilirubin: 0.7 mg/dL (ref 0.3–1.2)
Total Protein: 6.5 g/dL (ref 6.5–8.1)
Total Protein: 7.2 g/dL (ref 6.5–8.1)

## 2020-05-14 LAB — D-DIMER, QUANTITATIVE: D-Dimer, Quant: 1.05 ug/mL-FEU — ABNORMAL HIGH (ref 0.00–0.50)

## 2020-05-14 LAB — C-REACTIVE PROTEIN: CRP: 0.6 mg/dL (ref <1.0)

## 2020-05-14 NOTE — Progress Notes (Addendum)
PROGRESS NOTE  Kayla [name] [id number] DOB: 1946/03/31 DOA: 05/11/2020 PCP: Nicolette Bang, DO  HPI/Recap of past 24 hours: Kayla [name] is a 75 year old female who has history of hypertension hypothyroidism nonobstructive coronary artery disease obesity who was admitted to the hospital for shortness of breath and worsening low back pain radiating to her right lower, extremity  Subjective: May 14, 2020. Patient seen and examined at bedside she still complaining of severe pain in the lumbar area said the pain is so bad she would rather die than continue to have this pain.  Patient was not able to participate with physical therapy  May 13, 2020 Patient seen and examined at bedside still complaining of pain in her lumbar area.  She is requesting to have for an MRI  Assessment/Plan: Principal Problem:   Sciatica Active Problems:   Essential hypertension   Class 2 severe obesity due to excess calories with serious comorbidity and body mass index (BMI) of 38.0 to 38.9 in adult [facility])   Acquired hypothyroidism   COVID-19   Hyponatremia  #1 sciatica with pain radiating to the right lower extremity.  Continue pain management patient does have a follow-up appointment with neurosurgery she would do that on discharge. I will order lumbar MRI without contrast PT recommended SNF  2.  COVID-19 infection patient 3 nonhypoxic on room air.  She was started on remdesivir May 12, 2018 says she has received a second dose today she will receive her third dose of likely discharge tomorrow for total of 3 doses  3.  Hyponatremia and hypokalemia.  Potassium is still low it is 122 today.   Patient is on chlorthalidone which might be causing the electrolyte imbalance.  Chlorthalidone is on hold she received IV bolus in the ED. Will replace with IV saline.  4. hypertension currently stable with chlorthalidone is on hold due to electrolyte imbalance.  Continue IV hydralazine as needed as well  as Cardizem.  5.  Nonobstructive coronary disease patient does not have chest pain at this time we will continue to monitor  Code Status: Full  Severity of Illness: The appropriate patient status for this patient is INPATIENT. Inpatient status is judged to be reasonable and necessary in order to provide the required intensity of service to ensure the patient's safety. The patient's presenting symptoms, physical exam findings, and initial radiographic and laboratory data in the context of their chronic comorbidities is felt to place them at high risk for further clinical deterioration. Furthermore, it is not anticipated that the patient will be medically stable for discharge from the hospital within 2 midnights of admission. The following factors support the patient status of inpatient.   " The patient's presenting symptoms include COVID-19. " The worrisome physical exam findings include COVID-19 infection. " The initial radiographic and laboratory data are worrisome because of COVID-19 positive and hyponatremia requiring IV infusion.  " The chronic co-morbidities include hypertension severe lumbar radiculopathy with pain   * I certify that at the point of admission it is my clinical judgment that the patient will require inpatient hospital care spanning beyond 2 midnights from the point of admission due to high intensity of service, high risk for further deterioration and high frequency of surveillance required.*    Family Communication: None  Disposition Plan: Home in 1 to 2 days after remdesivir   Consultants:  Physical therapy  Procedures:  None  Antimicrobials:  None  DVT prophylaxis: Lovenox   Objective: Vitals:   05/13/20 1440 05/13/20 2017  05/14/20 0507 05/14/20 1328  BP: 127/81 138/86 121/76 (!) 165/92  Pulse: 64 68 65 74  Resp:  18 19 14   Temp: 98.1 F (36.7 C) 98.2 F (36.8 C) 98 F (36.7 C) 98.1 F (36.7 C)  TempSrc: Oral Oral Oral Oral  SpO2:  94% 92%  99%  Weight:      Height:        Intake/Output Summary (Last 24 hours) at 05/14/2020 1645 Last data filed at 05/14/2020 1300 Gross per 24 hour  Intake 480 ml  Output 1150 ml  Net -670 ml   Filed Weights   05/11/20 2102  Weight: 97.5 kg   Body mass index is 36.9 kg/m.  Exam:  . General: 75 y.o. year-old female well developed well nourished in no acute distress.  Alert and oriented x3.  Overweight . Cardiovascular: Regular rate and rhythm with no rubs or gallops.  No thyromegaly or JVD noted.   Marland Kitchen Respiratory: Clear to auscultation with no wheezes or rales. Good inspiratory effort. . Abdomen: Soft nontender nondistended with normal bowel sounds x4 quadrants. . Musculoskeletal: No lower extremity edema. 2/4 pulses in all 4 extremities.  Gait was not assessed. . Skin: No ulcerative lesions noted or rashes, . Psychiatry: Mood is appropriate for condition and setting    Data Reviewed: CBC: Recent Labs  Lab 05/11/20 2229 05/12/20 0552 05/13/20 0427 05/14/20 0421  WBC 11.6* 8.8 9.8 10.3  NEUTROABS 10.5*  --  8.6* 8.9*  HGB 16.1* 15.1* 14.8 14.6  HCT 44.5 42.4 43.9 42.4  MCV 90.8 92.0 95.4 94.9  PLT 270 261 241 361   Basic Metabolic Panel: Recent Labs  Lab 05/11/20 2229 05/12/20 0552 05/13/20 0427 05/14/20 0421 05/14/20 1531  NA 123* 125* 122* 131* 133*  K 2.8* 2.8* 3.5 4.0 4.0  CL 86* 87* 90* 93* 96*  CO2 23 25 22 27 27   GLUCOSE 159* 123* 123* 140* 146*  BUN 16 14 16  24* 26*  CREATININE 0.89 0.74 0.65 0.90 0.88  CALCIUM 9.6 9.3 9.0 9.6 9.8  MG  --   --  2.0  --   --   PHOS  --   --  2.8  --   --    GFR: Estimated Creatinine Clearance: 63.6 mL/min (by C-G formula based on SCr of 0.88 mg/dL). Liver Function Tests: Recent Labs  Lab 05/11/20 2229 05/13/20 0427 05/14/20 0421 05/14/20 1531  AST 48* 32 30 36  ALT 34 29 29 37  ALKPHOS 60 49 52 57  BILITOT 0.7 0.7 0.7 0.7  PROT 7.5 6.5 6.5 7.2  ALBUMIN 4.1 3.5 3.4* 3.8   No results for input(s): LIPASE,  AMYLASE in the last 168 hours. No results for input(s): AMMONIA in the last 168 hours. Coagulation Profile: No results for input(s): INR, PROTIME in the last 168 hours. Cardiac Enzymes: No results for input(s): CKTOTAL, CKMB, CKMBINDEX, TROPONINI in the last 168 hours. BNP (last 3 results) No results for input(s): PROBNP in the last 8760 hours. HbA1C: No results for input(s): HGBA1C in the last 72 hours. CBG: No results for input(s): GLUCAP in the last 168 hours. Lipid Profile: No results for input(s): CHOL, HDL, LDLCALC, TRIG, CHOLHDL, LDLDIRECT in the last 72 hours. Thyroid Function Tests: Recent Labs    05/12/20 0552  TSH 2.570   Anemia Panel: No results for input(s): VITAMINB12, FOLATE, FERRITIN, TIBC, IRON, RETICCTPCT in the last 72 hours. Urine analysis:    Component Value Date/Time   COLORURINE YELLOW 05/12/2020 0552  APPEARANCEUR CLEAR 05/12/2020 0552   APPEARANCEUR Clear 07/08/2019 1531   LABSPEC 1.019 05/12/2020 0552   PHURINE 6.0 05/12/2020 0552   GLUCOSEU NEGATIVE 05/12/2020 0552   HGBUR SMALL (A) 05/12/2020 0552   HGBUR moderate 11/08/2008 1602   BILIRUBINUR NEGATIVE 05/12/2020 0552   BILIRUBINUR negative 05/04/2020 0929   BILIRUBINUR Negative 07/08/2019 1531   KETONESUR NEGATIVE 05/12/2020 0552   PROTEINUR 30 (A) 05/12/2020 0552   UROBILINOGEN 0.2 05/04/2020 0929   UROBILINOGEN 0.2 08/11/2017 1647   NITRITE NEGATIVE 05/12/2020 0552   LEUKOCYTESUR NEGATIVE 05/12/2020 0552   Sepsis Labs: @LABRCNTIP (procalcitonin:4,lacticidven:4)  )No results found for this or any previous visit (from the past 240 hour(s)).    Studies: No results found.  Scheduled Meds: . aspirin EC  81 mg Oral Daily  . enoxaparin (LOVENOX) injection  40 mg Subcutaneous Q24H  . ferrous sulfate  325 mg Oral Q breakfast  . levothyroxine  125 mcg Oral Q0600  . magnesium oxide  400 mg Oral BID  . methylPREDNISolone (SOLU-MEDROL) injection  40 mg Intravenous Q12H  . cyanocobalamin   1,000 mcg Oral Daily    Continuous Infusions: . sodium chloride 10 mL/hr at 05/12/20 2018  . remdesivir 100 mg in NS 100 mL 100 mg (05/14/20 1151)     LOS: 1 day     Cristal Deer, MD [facility]  To reach me or the doctor on call, go to: [email or web address] Password [email or web address]  05/14/2020, 4:45 PM

## 2020-05-15 ENCOUNTER — Inpatient Hospital Stay (HOSPITAL_COMMUNITY): Payer: Medicare PPO

## 2020-05-15 DIAGNOSIS — M5431 Sciatica, right side: Secondary | ICD-10-CM | POA: Diagnosis not present

## 2020-05-15 LAB — COMPREHENSIVE METABOLIC PANEL
ALT: 42 U/L (ref 0–44)
AST: 35 U/L (ref 15–41)
Albumin: 3.7 g/dL (ref 3.5–5.0)
Alkaline Phosphatase: 58 U/L (ref 38–126)
Anion gap: 11 (ref 5–15)
BUN: 28 mg/dL — ABNORMAL HIGH (ref 8–23)
CO2: 27 mmol/L (ref 22–32)
Calcium: 9.8 mg/dL (ref 8.9–10.3)
Chloride: 96 mmol/L — ABNORMAL LOW (ref 98–111)
Creatinine, Ser: 0.84 mg/dL (ref 0.44–1.00)
GFR, Estimated: >60 mL/min (ref >60)
Glucose, Bld: 144 mg/dL — ABNORMAL HIGH (ref 70–99)
Potassium: 4.1 mmol/L (ref 3.5–5.1)
Sodium: 134 mmol/L — ABNORMAL LOW (ref 135–145)
Total Bilirubin: 1 mg/dL (ref 0.3–1.2)
Total Protein: 6.9 g/dL (ref 6.5–8.1)

## 2020-05-15 LAB — CBC WITH DIFFERENTIAL/PLATELET
Abs Immature Granulocytes: 0.04 10*3/uL (ref 0.00–0.07)
Basophils Absolute: 0 10*3/uL (ref 0.0–0.1)
Basophils Relative: 0 %
Eosinophils Absolute: 0 10*3/uL (ref 0.0–0.5)
Eosinophils Relative: 0 %
HCT: 47.4 % — ABNORMAL HIGH (ref 36.0–46.0)
Hemoglobin: 15.9 g/dL — ABNORMAL HIGH (ref 12.0–15.0)
Immature Granulocytes: 0 %
Lymphocytes Relative: 11 %
Lymphs Abs: 1.2 10*3/uL (ref 0.7–4.0)
MCH: 32.1 pg (ref 26.0–34.0)
MCHC: 33.5 g/dL (ref 30.0–36.0)
MCV: 95.6 fL (ref 80.0–100.0)
Monocytes Absolute: 0.5 10*3/uL (ref 0.1–1.0)
Monocytes Relative: 5 %
Neutro Abs: 9 10*3/uL — ABNORMAL HIGH (ref 1.7–7.7)
Neutrophils Relative %: 84 %
Platelets: 361 10*3/uL (ref 150–400)
RBC: 4.96 MIL/uL (ref 3.87–5.11)
RDW: 13.6 % (ref 11.5–15.5)
WBC: 10.8 10*3/uL — ABNORMAL HIGH (ref 4.0–10.5)
nRBC: 0 % (ref 0.0–0.2)

## 2020-05-15 LAB — C-REACTIVE PROTEIN: CRP: 0.5 mg/dL (ref <1.0)

## 2020-05-15 LAB — D-DIMER, QUANTITATIVE: D-Dimer, Quant: 1.12 ug/mL-FEU — ABNORMAL HIGH (ref 0.00–0.50)

## 2020-05-15 MED ORDER — METHYLPREDNISOLONE SODIUM SUCC 40 MG IJ SOLR
40.0000 mg | INTRAMUSCULAR | Status: DC
  Administered 2020-05-16 – 2020-05-19 (×4): 40 mg via INTRAVENOUS
  Filled 2020-05-15 (×4): qty 1

## 2020-05-15 MED ORDER — AMLODIPINE BESYLATE 5 MG PO TABS: 5.0000 mg | ORAL_TABLET | Freq: Every day | ORAL | Status: DC

## 2020-05-15 MED ORDER — AMLODIPINE BESYLATE 5 MG PO TABS
5.0000 mg | ORAL_TABLET | Freq: Every day | ORAL | Status: DC
  Administered 2020-05-15 – 2020-05-30 (×16): 5 mg via ORAL
  Filled 2020-05-15 (×16): qty 1

## 2020-05-15 MED ORDER — TRAZODONE HCL 50 MG PO TABS
50.0000 mg | ORAL_TABLET | Freq: Every evening | ORAL | Status: DC | PRN
  Administered 2020-05-15 – 2020-05-24 (×5): 50 mg via ORAL
  Filled 2020-05-15 (×6): qty 1

## 2020-05-15 MED ORDER — HYDRALAZINE HCL 20 MG/ML IJ SOLN
10.0000 mg | Freq: Four times a day (QID) | INTRAMUSCULAR | Status: DC | PRN
  Administered 2020-05-16 – 2020-05-21 (×2): 10 mg via INTRAVENOUS
  Filled 2020-05-15 (×2): qty 1

## 2020-05-15 NOTE — Anesthesia Preprocedure Evaluation (Addendum)
Anesthesia Evaluation  Patient identified by MRN, date of birth, ID band Patient awake    Reviewed: Allergy & Precautions, NPO status , Patient's Chart, lab work & pertinent test results  History of Anesthesia Complications Negative for: history of anesthetic complications  Airway Mallampati: II  TM Distance: >3 FB Neck ROM: Full    Dental   Pulmonary asthma , Recent URI  (recent COVID infection s/p monoclonal antibodies and remdesivir; still requiring nasal cannula O2),    Pulmonary exam normal        Cardiovascular hypertension, + CAD  Normal cardiovascular exam     Neuro/Psych Anxiety Depression negative neurological ROS     GI/Hepatic Neg liver ROS, GERD  ,  Endo/Other  Hypothyroidism   Renal/GU negative Renal ROS  negative genitourinary   Musculoskeletal  (+) Arthritis , Fibromyalgia -  Abdominal   Peds  Hematology negative hematology ROS (+)   Anesthesia Other Findings   Reproductive/Obstetrics                          Anesthesia Physical Anesthesia Plan  ASA: III  Anesthesia Plan: MAC   Post-op Pain Management:    Induction: Intravenous  PONV Risk Score and Plan: 2 and Treatment may vary due to age or medical condition  Airway Management Planned: Natural Airway, Nasal Cannula and Simple Face Mask  Additional Equipment: None  Intra-op Plan:   Post-operative Plan:   Informed Consent: I have reviewed the patients History and Physical, chart, labs and discussed the procedure including the risks, benefits and alternatives for the proposed anesthesia with the patient or authorized representative who has indicated his/her understanding and acceptance.       Plan Discussed with:   Anesthesia Plan Comments: (Patient currently admitted to [facility] for sciatica. Also + COVID-19 05/10/20, s/p monoclonal antibodies. S/p two doses of remdesivir as of 05/15/20. She is being considered for  discharge to SNF in 1-2 days. MRI L-spine initially ordered for [facility], but patient reported need for anesthesia. I spoke with Tori in MRI. [facility] staff to arrange [facility] to [facility] via [facility] with estimated arrival time 05/16/20 between 8:00-9:00 AM. Following recovery from MRI, she is to be transported back to [facility]. I spoke with Kathlee Nations in PACU to alert her of patient's COVID status so appropriate recovery arrangements could be made. )       Anesthesia Quick Evaluation

## 2020-05-15 NOTE — Progress Notes (Signed)
[facility] is scheduled to transport patient to [facility] short stay at 0800, prior to MRI with anesthesia. Patient aware.  Virginia Rochester, RN
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
## 2020-05-15 NOTE — TOC Progression Note (Signed)
Transition of Care [facility]) - Progression Note    Patient Details  Name: Kayla [name] MRN: [id number] Date of Birth: [birth year]  Transition of Care [facility]) CM/SW [place], [place] Phone Number: 05/15/2020, 3:23 PM  Clinical Narrative:   Followed up with patient for clarification as she told weekend social worker she was open to going to SNF, and told MD she plans to go home [according to note.]  Ms Riecke wanted to communicate many things to me, including the fact that her son and his children are dependent upon her for housing, income and care, and that she cannot do anything to jeopardize that balance. I explained to her that if she goes to SNF, it will be paid in full for up to 20 days by her MCR.  She went on to tell me about how much pain she is in, and she is not willing to talk further about her plan post d/c until she can get her MRI, then get the results, and then find out the course of action to address the pain.  Will circle back when patient has returned from MRI tomorrow. TOC will continue to follow during the course of hospitalization.     Expected Discharge Plan:  (SNF v Home) Barriers to Discharge: Other (comment) (Patient unwilling to commit to plan until she has results of MRI)  Expected Discharge Plan and Services Expected Discharge Plan:  (SNF v Home) In-house Referral: NA Discharge Planning Services: NA Post Acute Care Choice: [place] Living arrangements for the past 2 months: Single Family Home                 DME Arranged: N/A DME Agency: NA       HH Arranged: NA HH Agency: NA         Social Determinants of Health (SDOH) Interventions    Readmission Risk Interventions No flowsheet data found.

## 2020-05-15 NOTE — Progress Notes (Signed)
Technologists attempted to pick up patient for MRI L spine wo/w. Patient stated she cannot have an MRI without General Anesthesia. She stated she will freak out and absolutely cannot do it without being fully sedated. RN aware. [place] MRI made aware of possibility also.

## 2020-05-16 ENCOUNTER — Inpatient Hospital Stay (HOSPITAL_COMMUNITY): Payer: Medicare PPO | Admitting: Certified Registered"

## 2020-05-16 ENCOUNTER — Ambulatory Visit (HOSPITAL_COMMUNITY)
Admit: 2020-05-16 | Discharge: 2020-05-16 | Disposition: A | Discharge status: Home / Self Care | Payer: Medicare PPO | Attending: Internal Medicine | Admitting: Internal Medicine

## 2020-05-16 ENCOUNTER — Inpatient Hospital Stay (HOSPITAL_COMMUNITY): Admission: RE | Admit: 2020-05-16 | Payer: Medicare PPO | Source: Home / Self Care

## 2020-05-16 ENCOUNTER — Inpatient Hospital Stay (HOSPITAL_COMMUNITY): Payer: Medicare PPO

## 2020-05-16 ENCOUNTER — Inpatient Hospital Stay: Admit: 2020-05-16 | Payer: Medicare PPO

## 2020-05-16 ENCOUNTER — Encounter (HOSPITAL_COMMUNITY)
Admission: EM | Disposition: A | Discharge status: Home / Self Care | Payer: Self-pay | Source: Home / Self Care | Attending: Internal Medicine

## 2020-05-16 DIAGNOSIS — M5431 Sciatica, right side: Secondary | ICD-10-CM | POA: Diagnosis not present

## 2020-05-16 HISTORY — PX: RADIOLOGY WITH ANESTHESIA: SHX6223

## 2020-05-16 LAB — CBC WITH DIFFERENTIAL/PLATELET
Abs Immature Granulocytes: 0.08 10*3/uL — ABNORMAL HIGH (ref 0.00–0.07)
Basophils Absolute: 0 10*3/uL (ref 0.0–0.1)
Basophils Relative: 0 %
Eosinophils Absolute: 0 10*3/uL (ref 0.0–0.5)
Eosinophils Relative: 0 %
HCT: 50.3 % — ABNORMAL HIGH (ref 36.0–46.0)
Hemoglobin: 16.7 g/dL — ABNORMAL HIGH (ref 12.0–15.0)
Immature Granulocytes: 1 %
Lymphocytes Relative: 20 %
Lymphs Abs: 3.5 10*3/uL (ref 0.7–4.0)
MCH: 32.6 pg (ref 26.0–34.0)
MCHC: 33.2 g/dL (ref 30.0–36.0)
MCV: 98.2 fL (ref 80.0–100.0)
Monocytes Absolute: 2.3 10*3/uL — ABNORMAL HIGH (ref 0.1–1.0)
Monocytes Relative: 13 %
Neutro Abs: 11.6 10*3/uL — ABNORMAL HIGH (ref 1.7–7.7)
Neutrophils Relative %: 66 %
Platelets: 415 10*3/uL — ABNORMAL HIGH (ref 150–400)
RBC: 5.12 MIL/uL — ABNORMAL HIGH (ref 3.87–5.11)
RDW: 13.3 % (ref 11.5–15.5)
WBC: 17.5 10*3/uL — ABNORMAL HIGH (ref 4.0–10.5)
nRBC: 0 % (ref 0.0–0.2)

## 2020-05-16 LAB — C-REACTIVE PROTEIN: CRP: <0.5 mg/dL (ref <1.0)

## 2020-05-16 LAB — COMPREHENSIVE METABOLIC PANEL
ALT: 38 U/L (ref 0–44)
AST: 25 U/L (ref 15–41)
Albumin: 3.7 g/dL (ref 3.5–5.0)
Alkaline Phosphatase: 55 U/L (ref 38–126)
Anion gap: 13 (ref 5–15)
BUN: 25 mg/dL — ABNORMAL HIGH (ref 8–23)
CO2: 23 mmol/L (ref 22–32)
Calcium: 9.5 mg/dL (ref 8.9–10.3)
Chloride: 99 mmol/L (ref 98–111)
Creatinine, Ser: 0.78 mg/dL (ref 0.44–1.00)
GFR, Estimated: >60 mL/min (ref >60)
Glucose, Bld: 110 mg/dL — ABNORMAL HIGH (ref 70–99)
Potassium: 3.6 mmol/L (ref 3.5–5.1)
Sodium: 135 mmol/L (ref 135–145)
Total Bilirubin: 1.2 mg/dL (ref 0.3–1.2)
Total Protein: 6.8 g/dL (ref 6.5–8.1)

## 2020-05-16 LAB — D-DIMER, QUANTITATIVE: D-Dimer, Quant: 1.43 ug/mL-FEU — ABNORMAL HIGH (ref 0.00–0.50)

## 2020-05-16 SURGERY — MRI WITH ANESTHESIA
Anesthesia: Monitor Anesthesia Care

## 2020-05-16 MED ORDER — MIDAZOLAM HCL 5 MG/5ML IJ SOLN
INTRAMUSCULAR | Status: DC | PRN
  Administered 2020-05-16: 2 mg via INTRAVENOUS
  Administered 2020-05-16 (×2): 1 mg via INTRAVENOUS

## 2020-05-16 MED ORDER — LACTATED RINGERS IV SOLN: INTRAVENOUS | Status: DC

## 2020-05-16 MED ORDER — ONDANSETRON HCL 4 MG/2ML IJ SOLN
4.0000 mg | Freq: Once | INTRAMUSCULAR | Status: AC
  Administered 2020-05-16: 4 mg via INTRAVENOUS
  Filled 2020-05-16: qty 2

## 2020-05-16 MED ORDER — FENTANYL CITRATE (PF) 100 MCG/2ML IJ SOLN
INTRAMUSCULAR | Status: AC
  Administered 2020-05-16: 50 ug via INTRAVENOUS
  Filled 2020-05-16: qty 2

## 2020-05-16 MED ORDER — GADOBUTROL 1 MMOL/ML IV SOLN
10.0000 mL | Freq: Once | INTRAVENOUS | Status: AC | PRN
  Administered 2020-05-16: 10 mL via INTRAVENOUS

## 2020-05-16 MED ORDER — LIDOCAINE 5 % EX PTCH
1.0000 | MEDICATED_PATCH | CUTANEOUS | Status: DC
  Administered 2020-05-16 – 2020-05-29 (×13): 1 via TRANSDERMAL
  Filled 2020-05-16 (×12): qty 1

## 2020-05-16 MED ORDER — FENTANYL CITRATE (PF) 100 MCG/2ML IJ SOLN
INTRAMUSCULAR | Status: DC | PRN
  Administered 2020-05-16: 50 ug via INTRAVENOUS

## 2020-05-16 MED ORDER — HYDROCODONE-ACETAMINOPHEN 10-325 MG PO TABS
1.0000 | ORAL_TABLET | Freq: Four times a day (QID) | ORAL | Status: DC | PRN
Start: 2020-05-16 — End: 2020-05-18
  Administered 2020-05-16 – 2020-05-18 (×4): 1 via ORAL
  Filled 2020-05-16 (×5): qty 1

## 2020-05-16 MED ORDER — HYDROMORPHONE HCL 1 MG/ML IJ SOLN
0.2500 mg | INTRAMUSCULAR | Status: DC | PRN
  Administered 2020-05-16: 0.5 mg via INTRAVENOUS

## 2020-05-16 MED ORDER — FENTANYL CITRATE (PF) 100 MCG/2ML IJ SOLN
50.0000 ug | Freq: Once | INTRAMUSCULAR | Status: AC | PRN
  Administered 2020-05-16: 50 ug via INTRAVENOUS

## 2020-05-16 NOTE — Progress Notes (Signed)
Severe right knee pain, warm/tender to touch min/moderae relief from pain meds

## 2020-05-16 NOTE — Progress Notes (Signed)
I will plan to advise the patient after her MRI has been done.

## 2020-05-16 NOTE — Anesthesia Postprocedure Evaluation (Signed)
Anesthesia Post Note  Patient: Kayla [name]  Procedure(s) Performed: MRI LUMBAR SPINE W/CONTRAST (N/A )     Patient location during evaluation: PACU Anesthesia Type: MAC Level of consciousness: awake and alert Pain management: pain level controlled Vital Signs Assessment: post-procedure vital signs reviewed and stable Respiratory status: spontaneous breathing, nonlabored ventilation, respiratory function stable and patient connected to nasal cannula oxygen Cardiovascular status: stable and blood pressure returned to baseline Postop Assessment: no apparent nausea or vomiting Anesthetic complications: no   No complications documented.  Last Vitals:  Vitals:   05/16/20 1303 05/16/20 1348  BP: 131/83 135/83  Pulse: (!) 110 (!) 101  Resp: 16 16  Temp:  36.9 C  SpO2: 97% 96%    Last Pain:  Vitals:   05/16/20 1348  TempSrc: Oral  PainSc: 10-Worst pain ever                 Catalina Gravel
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
## 2020-05-16 NOTE — Progress Notes (Signed)
Patient called out reporting nausea. Dr. Christella Hartigan notified and orders received. Patient also starting to get anxious and reports that pain is coming back.

## 2020-05-16 NOTE — Anesthesia Procedure Notes (Signed)
Procedure Name: MAC Date/Time: 05/16/2020 11:40 AM Performed by: Inda Coke, CRNA Pre-anesthesia Checklist: Patient identified, Emergency Drugs available, Suction available, Timeout performed and Patient being monitored Patient Re-evaluated:Patient Re-evaluated prior to induction Oxygen Delivery Method: Nasal cannula Induction Type: IV induction Dental Injury: Teeth and Oropharynx as per pre-operative assessment

## 2020-05-16 NOTE — Progress Notes (Signed)
I have reviewed the patient's lumbar MRI performed today at [facility].  The patient has a grade 1 spondylolisthesis at L4-5 with bilateral facet arthropathy and lateral recess stenosis.  She has a foraminal bulging disc at L3-4 on the right.  She does not need urgent surgery for this.   I recommend symptomatic treatment, physical therapy and have her follow up with me in the office after she has recovered from [place].  Please call if I can be of further assistance.

## 2020-05-16 NOTE — Progress Notes (Signed)
I was contacted by Dr. Pietro Cassis regarding patient's right knee pain and have reviewed this patient's chart. There are multiple notations of low back pain with radicular right leg and knee pain. 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 This would certainly be expected to cause right leg and knee pain. I was specifically asked about the utility of an intraarticular right knee steoid injection, which I feel may be considered on an outpatient basis, but I would not recommend one at this time, as I feel that the patient's disc herniation is much more likely to be her primary pain generator, and there are risks to injections. I defer management of this patient's disc herniation and right lumbar radiculopathy to Dr. Arnoldo Morale. The patient may follow-up with Dr. Mayer Camel on an outpatient basis with regards to her right knee osteoarthritis.

## 2020-05-16 NOTE — Transfer of Care (Signed)
Immediate Anesthesia Transfer of Care Note  Patient: Kayla [name]  Procedure(s) Performed: MRI LUMBAR SPINE W/CONTRAST (N/A )  Patient Location: PACU  Anesthesia Type:MAC  Level of Consciousness: awake, alert  and oriented  Airway & Oxygen Therapy: Patient Spontanous Breathing and Patient connected to nasal cannula oxygen  Post-op Assessment: Report given to RN and Post -op Vital signs reviewed and stable  Post vital signs: Reviewed and stable  Last Vitals:  Vitals Value Taken Time  BP 116/80 05/16/20 1248  Temp    Pulse 107 05/16/20 1300  Resp 17 05/16/20 1300  SpO2 100 % 05/16/20 1300  Vitals shown include unvalidated device data.  Last Pain:  Vitals:   05/16/20 1248  TempSrc:   PainSc: 10-Worst pain ever      Patients Stated Pain Goal: 3 (40/90/50 2561)  Complications: No complications documented.

## 2020-05-17 ENCOUNTER — Encounter (HOSPITAL_COMMUNITY): Payer: Self-pay | Admitting: Radiology

## 2020-05-17 ENCOUNTER — Other Ambulatory Visit: Payer: Medicare PPO

## 2020-05-17 DIAGNOSIS — M5431 Sciatica, right side: Secondary | ICD-10-CM | POA: Diagnosis not present

## 2020-05-17 LAB — CBC WITH DIFFERENTIAL/PLATELET
Abs Immature Granulocytes: 0.06 10*3/uL (ref 0.00–0.07)
Basophils Absolute: 0 10*3/uL (ref 0.0–0.1)
Basophils Relative: 0 %
Eosinophils Absolute: 0 10*3/uL (ref 0.0–0.5)
Eosinophils Relative: 0 %
HCT: 47 % — ABNORMAL HIGH (ref 36.0–46.0)
Hemoglobin: 15.3 g/dL — ABNORMAL HIGH (ref 12.0–15.0)
Immature Granulocytes: 1 %
Lymphocytes Relative: 20 %
Lymphs Abs: 1.9 10*3/uL (ref 0.7–4.0)
MCH: 32.4 pg (ref 26.0–34.0)
MCHC: 32.6 g/dL (ref 30.0–36.0)
MCV: 99.6 fL (ref 80.0–100.0)
Monocytes Absolute: 1.1 10*3/uL — ABNORMAL HIGH (ref 0.1–1.0)
Monocytes Relative: 11 %
Neutro Abs: 6.8 10*3/uL (ref 1.7–7.7)
Neutrophils Relative %: 68 %
Platelets: 412 10*3/uL — ABNORMAL HIGH (ref 150–400)
RBC: 4.72 MIL/uL (ref 3.87–5.11)
RDW: 13.7 % (ref 11.5–15.5)
WBC: 9.9 10*3/uL (ref 4.0–10.5)
nRBC: 0 % (ref 0.0–0.2)

## 2020-05-17 LAB — COMPREHENSIVE METABOLIC PANEL
ALT: 31 U/L (ref 0–44)
AST: 18 U/L (ref 15–41)
Albumin: 3.6 g/dL (ref 3.5–5.0)
Alkaline Phosphatase: 51 U/L (ref 38–126)
Anion gap: 10 (ref 5–15)
BUN: 26 mg/dL — ABNORMAL HIGH (ref 8–23)
CO2: 30 mmol/L (ref 22–32)
Calcium: 9.7 mg/dL (ref 8.9–10.3)
Chloride: 98 mmol/L (ref 98–111)
Creatinine, Ser: 0.99 mg/dL (ref 0.44–1.00)
GFR, Estimated: 60 mL/min — ABNORMAL LOW (ref >60)
Glucose, Bld: 112 mg/dL — ABNORMAL HIGH (ref 70–99)
Potassium: 4.1 mmol/L (ref 3.5–5.1)
Sodium: 138 mmol/L (ref 135–145)
Total Bilirubin: 1 mg/dL (ref 0.3–1.2)
Total Protein: 6.8 g/dL (ref 6.5–8.1)

## 2020-05-17 LAB — C-REACTIVE PROTEIN: CRP: 4.2 mg/dL — ABNORMAL HIGH (ref <1.0)

## 2020-05-17 LAB — D-DIMER, QUANTITATIVE: D-Dimer, Quant: 0.86 ug/mL-FEU — ABNORMAL HIGH (ref 0.00–0.50)

## 2020-05-17 MED ORDER — HYDROMORPHONE HCL 1 MG/ML IJ SOLN
0.5000 mg | INTRAMUSCULAR | Status: DC | PRN
  Administered 2020-05-18 – 2020-05-19 (×7): 0.5 mg via INTRAVENOUS
  Filled 2020-05-17 (×7): qty 0.5

## 2020-05-17 NOTE — Care Management Important Message (Signed)
Important Message  Patient Details IM Letter placed in Patient's door [place]. Name: Kayla [name] MRN: [id number] Date of Birth: Sep 06, 1945   Medicare Important Message Given:  Yes     Kerin Salen 05/17/2020, 2:22 PM

## 2020-05-17 NOTE — Progress Notes (Signed)
Physical Therapy Treatment Patient Details Name: Kayla [name] MRN: [id number] DOB: 05/03/1945 Today's Date: 05/17/2020    History of Present Illness Pt is a 75 y.o. female with history of hypertension, hypothyroidism nonobstructive CAD presents to the ER with complaints of worsening low back pain radiating to right lower extremity over the last couple of days.  Patient was recently in the ER with symptoms of upper respiratory tract on was diagnosed on February 2 with Covid infection. Pt admitted for R LE pain/sciatica.    PT Comments    Pt initially resistant to mobility due to 10/10 pain at rest, but agreeable with encouragement. Pt requires mod A to bring BLE over to EOB and upright trunk, very vocal regarding where and how therapist's assist with transfer. Therapist attempts to educate pt on log roll and sequencing with fair carryover. Pt completes STS transfers from EOB, maintains trunk slightly flexed forward despite cues, therapist at pt's R side providing R HHA. Pt pivots over to bedside chair with min G, slides R foot across floor during transfer. Pt uncomfortable in chair so assisted back to bed and to supine. Pt requires significantly increased time with transfers, ~10 minutes to complete with constant cues and encouragement. Pt with pain episodes lasting 20-30 seconds at a time, then cease and pt able to drink or begin conversation. Pt c/o pain from R knee down to ankle that wrap circumferentially around the leg, requests nothing touch the area because it is "sensitive".  Pt vocal regarding specific pain medication she wants and the benefits/effects of them, educated pt that RN has given her medication but I would let her wishes be known to the RN. Therapist offered encouragement and soothing cues throughout treatment with little carryover. RN notified of session and pt requesting pain medication. Updated frequency to 2x/week due to decreased tolerance to mobility due to high pain.  Follow Up  Recommendations  SNF     Equipment Recommendations  3in1 (PT)    Recommendations for Other Services       Precautions / Restrictions Precautions Precautions: Fall Precaution Comments: high pain Restrictions Weight Bearing Restrictions: No    Mobility  Bed Mobility Overal bed mobility: Needs Assistance Bed Mobility: Supine to Sit;Sit to Supine  Supine to sit: Mod assist  Sit to sidelying: Mod assist General bed mobility comments: supine<>sit through log rolling, mod A to bring BLE to EOB and upright trunk, mod A to lift BLE back into bed, significantly increased time taking ~10 minutes to complete transfers  Transfers Overall transfer level: Needs assistance Equipment used: 1 person hand held assist Transfers: Sit to/from [facility] Sit to Stand: Min assist Stand pivot transfers: Min guard  General transfer comment: therapist at R side providing HHA, min A to power up to standing, cues for quad/glute activation, min G to pivot over, increased time taking ~10 minutes to complete stand pivot transfers bed to chair and chair back to bed  Ambulation/Gait Ambulation/Gait assistance: Min assist  Assistive device: 1 person hand held assist Gait Pattern/deviations: Step-to pattern;Decreased stride length;Shuffle;Decreased weight shift to right;Trunk flexed Gait velocity: decreased  General Gait Details: slow steps, R foot sliding on floor with step progression, trunk flexed forward, therapist positioned at pt's R side for HHA   Stairs             Wheelchair Mobility    Modified Rankin (Stroke Patients Only)       Balance Overall balance assessment: Needs assistance Sitting-balance support: No upper extremity  supported Sitting balance-Leahy Scale: Fair Sitting balance - Comments: seated EOB   Standing balance support: During functional activity;Single extremity supported Standing balance-Leahy Scale: Poor Standing balance comment: reliant on UE  support       Cognition Arousal/Alertness: Awake/alert Behavior During Therapy: WFL for tasks assessed/performed Overall Cognitive Status: Within Functional Limits for tasks assessed       Exercises      General Comments General comments (skin integrity, edema, etc.): Pt c/o at rest and with mobility rating "10/10" that spans circumferentially from R knee to ankle. Episodes of "stabbing and throbbing" pain lasted 20-30 seconds, unpredictable, resolved suddenly and pt able to take sips of drink or begin conversation. Pt vocal regarding specific pain medications she wants to take to improve her pain and the benefits/effects of each.      Pertinent Vitals/Pain Pain Assessment: 0-10 Pain Score: 9  Pain Location: R knee to ankle, circumferentially Pain Descriptors / Indicators: Grimacing;Guarding;Moaning;Stabbing;Throbbing (symptoms lasted for 20-30seconds at a time) Pain Intervention(s): Limited activity within patient's tolerance;Monitored during session;Premedicated before session;Repositioned;Patient requesting pain meds-RN notified;Relaxation;Heat applied (very slow transfers, verbal cues prior to transfer completion)    Home Living                      Prior Function            PT Goals (current goals can now be found in the care plan section) Acute Rehab PT Goals Patient Stated Goal: decrease pain PT Goal Formulation: With patient Time For Goal Achievement: 05/26/20 Potential to Achieve Goals: Fair Progress towards PT goals: Progressing toward goals    Frequency    Min 2X/week      PT Plan Current plan remains appropriate;Frequency needs to be updated    Co-evaluation              AM-PAC PT "6 Clicks" Mobility   Outcome Measure  Help needed turning from your back to your side while in a flat bed without using bedrails?: A Lot Help needed moving from lying on your back to sitting on the side of a flat bed without using bedrails?: A Lot Help needed  moving to and from a bed to a chair (including a wheelchair)?: A Lot Help needed standing up from a chair using your arms (e.g., wheelchair or bedside chair)?: A Lot Help needed to walk in hospital room?: A Lot Help needed climbing 3-5 steps with a railing? : Total 6 Click Score: 11    End of Session   Activity Tolerance: Patient limited by pain Patient left: in bed;with call bell/phone within reach;with bed alarm set Nurse Communication: Mobility status;Patient requests pain meds PT Visit Diagnosis: Unsteadiness on feet (R26.81);Muscle weakness (generalized) (M62.81);Pain Pain - Right/Left: Right Pain - part of body: Leg     Time: 1130-1220 PT Time Calculation (min) (ACUTE ONLY): 50 min  Charges:  $Therapeutic Activity: 38-52 mins                      Tori Johnmichael Melhorn PT, DPT 05/17/20, 1:27 PM
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
## 2020-05-17 NOTE — TOC Progression Note (Signed)
Transition of Care [facility]) - Progression Note    Patient Details  Name: Kayla [name] MRN: [id number] Date of Birth: 09-25-1945  Transition of Care [facility]) CM/SW [place], [place] Phone Number: 05/17/2020, 3:29 PM  Clinical Narrative:   Patient now willing to consider going to short term rehab.  Says if she goes home she does not want [place] services "because it is a small space, and we have dogs who do not like strangers."  Bed search initiated. TOC will continue to follow during the course of hospitalization.     Expected Discharge Plan:  (SNF v Home) Barriers to Discharge: SNF Covid  Expected Discharge Plan and Services Expected Discharge Plan:  (SNF v Home) In-house Referral: NA Discharge Planning Services: NA Post Acute Care Choice: [place] Living arrangements for the past 2 months: Single Family Home                 DME Arranged: N/A DME Agency: NA       HH Arranged: NA HH Agency: NA         Social Determinants of Health (SDOH) Interventions    Readmission Risk Interventions No flowsheet data found.

## 2020-05-18 DIAGNOSIS — M5431 Sciatica, right side: Secondary | ICD-10-CM | POA: Diagnosis not present

## 2020-05-18 MED ORDER — HYDROCODONE-ACETAMINOPHEN 10-325 MG PO TABS
1.0000 | ORAL_TABLET | Freq: Four times a day (QID) | ORAL | Status: DC
  Administered 2020-05-18 – 2020-05-26 (×31): 1 via ORAL
  Filled 2020-05-18 (×33): qty 1

## 2020-05-18 MED ORDER — METHOCARBAMOL 750 MG PO TABS
750.0000 mg | ORAL_TABLET | Freq: Three times a day (TID) | ORAL | Status: DC
  Administered 2020-05-18 – 2020-05-30 (×35): 750 mg via ORAL
  Filled 2020-05-18 (×7): qty 2
  Filled 2020-05-18 (×3): qty 1
  Filled 2020-05-18: qty 2
  Filled 2020-05-18 (×3): qty 1
  Filled 2020-05-18: qty 2
  Filled 2020-05-18 (×2): qty 1
  Filled 2020-05-18: qty 2
  Filled 2020-05-18: qty 1
  Filled 2020-05-18 (×2): qty 2
  Filled 2020-05-18 (×3): qty 1
  Filled 2020-05-18 (×2): qty 2
  Filled 2020-05-18: qty 1
  Filled 2020-05-18: qty 2
  Filled 2020-05-18: qty 1
  Filled 2020-05-18: qty 2
  Filled 2020-05-18: qty 1
  Filled 2020-05-18 (×3): qty 2
  Filled 2020-05-18: qty 1
  Filled 2020-05-18: qty 2

## 2020-05-18 NOTE — Progress Notes (Signed)
Pt is crying and moaning wuth R knee and hip  pain this morning. Dilaudid prn given. Will cont to monitor.

## 2020-05-18 NOTE — Progress Notes (Signed)
Physical Therapy Treatment Patient Details Name: Kayla [name] MRN: [id number] DOB: 08/25/1945 Today's Date: 05/18/2020    History of Present Illness Pt is a 75 y.o. female with history of hypertension, hypothyroidism nonobstructive CAD presents to the ER with complaints of worsening low back pain radiating to right lower extremity over the last couple of days.  Patient was recently in the ER with symptoms of upper respiratory tract on was diagnosed on February 2 with Covid infection. Pt admitted for R LE pain/sciatica.    PT Comments    Pt not getting OOB with nursing staff due to her pain level and use of PureWick.  General Comments: AxO x 3 emotional, hysterical, over whelmed anxiety reguarding her back pain.  Assisted OOB to amb to bathroom.   General bed mobility comments: increased, increased time with over whelmed emotional hysteria reguarding her back pain.  Nearly 12 min to complete task. General transfer comment: increased, increased time to complete a partial upright stance with over whelming htsterical emotions reguarding her back pain which has also caused intense R knee pain. General Gait Details: increased, increased time to amb 8 feet from bed to bathroom.  Much effort to advance R LE and over whelming moaning R knee pain. Then was only able to amb 2 feet out of bathroom due to fatigue effort.  Pt positioned in recliner to comfort and multiple pillows.   Follow Up Recommendations  SNF     Equipment Recommendations  3in1 (PT)    Recommendations for Other Services       Precautions / Restrictions Precautions Precautions: Fall Precaution Comments: high back pain    Mobility  Bed Mobility Overal bed mobility: Needs Assistance Bed Mobility: Supine to Sit     Supine to sit: Min assist;Mod assist     General bed mobility comments: increased, increased time with over whelmed emotional hysteria reguarding her back pain.  Nearly 12 min to complete task.     Transfers Overall transfer level: Needs assistance Equipment used: Rolling walker (2 wheeled) Transfers: Sit to/from [facility] Sit to Stand: Min guard Stand pivot transfers: Min guard;Min assist       General transfer comment: increased, increased time to complete a partial upright stance with over whelming htsterical emotions reguarding her back pain which has also caused intense R knee pain.  Ambulation/Gait Ambulation/Gait assistance: Min guard;Min assist Gait Distance (Feet): 10 Feet Assistive device: Rolling walker (2 wheeled) Gait Pattern/deviations: Step-to pattern;Decreased stride length;Shuffle;Decreased weight shift to right;Trunk flexed;Decreased stance time - right Gait velocity: decreased x 3   General Gait Details: increased, increased time to amb 8 feet from bed to bathroom.  Much effort to advance R LE and over whelming moaning R knee pain. Then was only able to amb 2 feet out of bathroom due to fatigue effort.  Pt positioned in recliner to comfort and multiple pillows.   Stairs             Wheelchair Mobility    Modified Rankin (Stroke Patients Only)       Balance                                            Cognition Arousal/Alertness: Awake/alert Behavior During Therapy: WFL for tasks assessed/performed Overall Cognitive Status: Within Functional Limits for tasks assessed  General Comments: AxO x 3 emotional, hysterical, over whelmed anxiety reguarding her back pain      Exercises      General Comments        Pertinent Vitals/Pain Pain Assessment: Faces Faces Pain Scale: Hurts worst Pain Location: R knee radiating from back Pain Descriptors / Indicators: Grimacing;Guarding;Moaning;Stabbing;Throbbing Pain Intervention(s): Monitored during session;Repositioned;Premedicated before session    Home Living                      Prior Function             PT Goals (current goals can now be found in the care plan section) Progress towards PT goals: Progressing toward goals    Frequency    Min 2X/week      PT Plan Current plan remains appropriate;Frequency needs to be updated    Co-evaluation              AM-PAC PT "6 Clicks" Mobility   Outcome Measure  Help needed turning from your back to your side while in a flat bed without using bedrails?: A Lot Help needed moving from lying on your back to sitting on the side of a flat bed without using bedrails?: A Lot Help needed moving to and from a bed to a chair (including a wheelchair)?: A Lot Help needed standing up from a chair using your arms (e.g., wheelchair or bedside chair)?: A Lot Help needed to walk in hospital room?: A Lot Help needed climbing 3-5 steps with a railing? : Total 6 Click Score: 11    End of Session Equipment Utilized During Treatment: Gait belt Activity Tolerance: Patient limited by pain Patient left: in chair;with call bell/phone within reach Nurse Communication: Mobility status;Patient requests pain meds PT Visit Diagnosis: Unsteadiness on feet (R26.81);Muscle weakness (generalized) (M62.81);Pain Pain - Right/Left: Right Pain - part of body: Leg     Time: 1000-1030 PT Time Calculation (min) (ACUTE ONLY): 30 min  Charges:  $Gait Training: 8-22 mins $Therapeutic Activity: 8-22 mins                     Rica Koyanagi  PTA Acute  Rehabilitation Services Pager      [phone] Office      [phone]
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
## 2020-05-19 ENCOUNTER — Encounter: Payer: Self-pay | Admitting: Endocrinology

## 2020-05-19 ENCOUNTER — Ambulatory Visit: Payer: Medicare PPO | Admitting: Cardiology

## 2020-05-19 DIAGNOSIS — M5431 Sciatica, right side: Secondary | ICD-10-CM | POA: Diagnosis not present

## 2020-05-19 LAB — CREATININE, SERUM
Creatinine, Ser: 0.92 mg/dL (ref 0.44–1.00)
GFR, Estimated: >60 mL/min (ref >60)

## 2020-05-19 MED ORDER — PREDNISOLONE 5 MG PO TABS
40.0000 mg | ORAL_TABLET | Freq: Every day | ORAL | Status: DC
  Administered 2020-05-19 – 2020-05-23 (×5): 40 mg via ORAL
  Filled 2020-05-19 (×6): qty 8

## 2020-05-19 MED ORDER — MORPHINE SULFATE (PF) 2 MG/ML IV SOLN
1.0000 mg | INTRAVENOUS | Status: DC | PRN
  Administered 2020-05-19 – 2020-05-25 (×23): 1 mg via INTRAVENOUS
  Filled 2020-05-19 (×23): qty 1

## 2020-05-19 NOTE — TOC Progression Note (Signed)
Transition of Care [facility]) - Progression Note    Patient Details  Name: Kayla [name] MRN: [id number] Date of Birth: 10-15-1945  Transition of Care [facility]) CM/SW [place], [place] Phone Number: 05/19/2020, 2:04 PM  Clinical Narrative:   Patient selects bed at [facility].  They will have a bed Monday.  Insurance authorization request submitted. TOC will continue to follow during the course of hospitalization.     Expected Discharge Plan:  (SNF v Home) Barriers to Discharge: Barriers Resolved  Expected Discharge Plan and Services Expected Discharge Plan:  (SNF v Home) In-house Referral: NA Discharge Planning Services: NA Post Acute Care Choice: [place] Living arrangements for the past 2 months: Single Family Home                 DME Arranged: N/A DME Agency: NA       HH Arranged: NA HH Agency: NA         Social Determinants of Health (SDOH) Interventions    Readmission Risk Interventions No flowsheet data found.
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
## 2020-05-19 NOTE — Care Management Important Message (Signed)
Medicare IM printed for Kayla [name] to give to the patient. 

## 2020-05-20 DIAGNOSIS — M5431 Sciatica, right side: Secondary | ICD-10-CM | POA: Diagnosis not present

## 2020-05-20 LAB — URINALYSIS, ROUTINE W REFLEX MICROSCOPIC
Bilirubin Urine: NEGATIVE
Glucose, UA: NEGATIVE mg/dL
Hgb urine dipstick: NEGATIVE
Ketones, ur: NEGATIVE mg/dL
Leukocytes,Ua: NEGATIVE
Nitrite: NEGATIVE
Protein, ur: NEGATIVE mg/dL
Specific Gravity, Urine: 1.008 (ref 1.005–1.030)
pH: 6 (ref 5.0–8.0)
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
## 2020-05-20 NOTE — Plan of Care (Signed)
  Problem: Education: Goal: Knowledge of risk factors and measures for prevention of condition will improve Outcome: Progressing   Problem: Respiratory: Goal: Will maintain a patent airway Outcome: Progressing   Problem: Pain Managment: Goal: General experience of comfort will improve Outcome: Progressing

## 2020-05-21 DIAGNOSIS — M5431 Sciatica, right side: Secondary | ICD-10-CM | POA: Diagnosis not present

## 2020-05-21 LAB — URINE CULTURE

## 2020-05-21 LAB — BASIC METABOLIC PANEL
Anion gap: 14 (ref 5–15)
BUN: 23 mg/dL (ref 8–23)
CO2: 27 mmol/L (ref 22–32)
Calcium: 10 mg/dL (ref 8.9–10.3)
Chloride: 98 mmol/L (ref 98–111)
Creatinine, Ser: 1.18 mg/dL — ABNORMAL HIGH (ref 0.44–1.00)
GFR, Estimated: 48 mL/min — ABNORMAL LOW (ref >60)
Glucose, Bld: 128 mg/dL — ABNORMAL HIGH (ref 70–99)
Potassium: 4 mmol/L (ref 3.5–5.1)
Sodium: 139 mmol/L (ref 135–145)

## 2020-05-21 LAB — CBC WITH DIFFERENTIAL/PLATELET
Abs Immature Granulocytes: 0.2 10*3/uL — ABNORMAL HIGH (ref 0.00–0.07)
Basophils Absolute: 0.1 10*3/uL (ref 0.0–0.1)
Basophils Relative: 0 %
Eosinophils Absolute: 0 10*3/uL (ref 0.0–0.5)
Eosinophils Relative: 0 %
HCT: 49.1 % — ABNORMAL HIGH (ref 36.0–46.0)
Hemoglobin: 16.3 g/dL — ABNORMAL HIGH (ref 12.0–15.0)
Immature Granulocytes: 1 %
Lymphocytes Relative: 25 %
Lymphs Abs: 4.4 10*3/uL — ABNORMAL HIGH (ref 0.7–4.0)
MCH: 32.8 pg (ref 26.0–34.0)
MCHC: 33.2 g/dL (ref 30.0–36.0)
MCV: 98.8 fL (ref 80.0–100.0)
Monocytes Absolute: 1.9 10*3/uL — ABNORMAL HIGH (ref 0.1–1.0)
Monocytes Relative: 11 %
Neutro Abs: 11.2 10*3/uL — ABNORMAL HIGH (ref 1.7–7.7)
Neutrophils Relative %: 63 %
Platelets: 612 10*3/uL — ABNORMAL HIGH (ref 150–400)
RBC: 4.97 MIL/uL (ref 3.87–5.11)
RDW: 13.4 % (ref 11.5–15.5)
WBC: 17.8 10*3/uL — ABNORMAL HIGH (ref 4.0–10.5)
nRBC: 0 % (ref 0.0–0.2)

## 2020-05-21 MED ORDER — POLYETHYLENE GLYCOL 3350 17 G PO PACK: 17.0000 g | PACK | Freq: Every day | ORAL | Status: DC | PRN

## 2020-05-21 MED ORDER — SENNOSIDES-DOCUSATE SODIUM 8.6-50 MG PO TABS
1.0000 | ORAL_TABLET | Freq: Every day | ORAL | Status: DC
  Administered 2020-05-21 – 2020-05-22 (×2): 1 via ORAL
  Filled 2020-05-21 (×5): qty 1

## 2020-05-21 MED ORDER — SODIUM CHLORIDE 0.9 % IV SOLN: INTRAVENOUS | Status: DC
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
## 2020-05-22 ENCOUNTER — Ambulatory Visit: Payer: Medicare PPO | Admitting: Cardiology

## 2020-05-22 ENCOUNTER — Inpatient Hospital Stay (HOSPITAL_COMMUNITY): Payer: Medicare PPO

## 2020-05-22 DIAGNOSIS — M5431 Sciatica, right side: Secondary | ICD-10-CM | POA: Diagnosis not present

## 2020-05-22 LAB — CBC WITH DIFFERENTIAL/PLATELET
Abs Immature Granulocytes: 0.12 10*3/uL — ABNORMAL HIGH (ref 0.00–0.07)
Basophils Absolute: 0 10*3/uL (ref 0.0–0.1)
Basophils Relative: 0 %
Eosinophils Absolute: 0 10*3/uL (ref 0.0–0.5)
Eosinophils Relative: 0 %
HCT: 42.8 % (ref 36.0–46.0)
Hemoglobin: 14 g/dL (ref 12.0–15.0)
Immature Granulocytes: 1 %
Lymphocytes Relative: 22 %
Lymphs Abs: 3.7 10*3/uL (ref 0.7–4.0)
MCH: 32.3 pg (ref 26.0–34.0)
MCHC: 32.7 g/dL (ref 30.0–36.0)
MCV: 98.8 fL (ref 80.0–100.0)
Monocytes Absolute: 1.7 10*3/uL — ABNORMAL HIGH (ref 0.1–1.0)
Monocytes Relative: 10 %
Neutro Abs: 11.2 10*3/uL — ABNORMAL HIGH (ref 1.7–7.7)
Neutrophils Relative %: 67 %
Platelets: 393 10*3/uL (ref 150–400)
RBC: 4.33 MIL/uL (ref 3.87–5.11)
RDW: 13.6 % (ref 11.5–15.5)
WBC: 16.7 10*3/uL — ABNORMAL HIGH (ref 4.0–10.5)
nRBC: 0 % (ref 0.0–0.2)

## 2020-05-22 LAB — BASIC METABOLIC PANEL
Anion gap: 10 (ref 5–15)
BUN: 18 mg/dL (ref 8–23)
CO2: 23 mmol/L (ref 22–32)
Calcium: 9.1 mg/dL (ref 8.9–10.3)
Chloride: 106 mmol/L (ref 98–111)
Creatinine, Ser: 0.93 mg/dL (ref 0.44–1.00)
GFR, Estimated: >60 mL/min (ref >60)
Glucose, Bld: 117 mg/dL — ABNORMAL HIGH (ref 70–99)
Potassium: 4.1 mmol/L (ref 3.5–5.1)
Sodium: 139 mmol/L (ref 135–145)

## 2020-05-22 MED ORDER — SODIUM CHLORIDE (PF) 0.9 % IJ SOLN
INTRAMUSCULAR | Status: AC
  Filled 2020-05-22: qty 10

## 2020-05-22 MED ORDER — METHYLPREDNISOLONE ACETATE 40 MG/ML IJ SUSP
INTRAMUSCULAR | Status: AC
  Filled 2020-05-22: qty 1

## 2020-05-22 MED ORDER — IOPAMIDOL (ISOVUE-M 200) INJECTION 41%
INTRAMUSCULAR | Status: AC
  Filled 2020-05-22: qty 10

## 2020-05-22 MED ORDER — LIDOCAINE HCL (PF) 1 % IJ SOLN
INTRAMUSCULAR | Status: AC
  Filled 2020-05-22: qty 30

## 2020-05-22 MED ORDER — METHYLPREDNISOLONE ACETATE 80 MG/ML IJ SUSP
INTRAMUSCULAR | Status: AC
  Filled 2020-05-22: qty 1
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
## 2020-05-22 NOTE — Clinical Social Work Note (Cosign Needed)
    Durable Medical Equipment  (From admission, onward)         Start     Ordered   05/22/20 1412  For home use only DME 3 n 1  Once        05/22/20 1411   05/22/20 1412  For home use only DME Tub bench  Once        05/22/20 1411   05/22/20 1409  For home use only DME Hospital bed  Once       Question Answer Comment  Length of Need Lifetime   The above medical condition requires: Patient requires the ability to reposition frequently   Head must be elevated greater than: 30 degrees   Bed type Semi-electric   Trapeze Bar Yes   Support Surface: Gel Overlay      05/22/20 1409

## 2020-05-22 NOTE — Progress Notes (Signed)
Physical Therapy Treatment Patient Details Name: Kayla [name] MRN: [id number] DOB: Jun 21, 1945 Today's Date: 05/22/2020    History of Present Illness Pt is a 75 y.o. female with history of hypertension, hypothyroidism nonobstructive CAD presents to the ER with complaints of worsening low back pain radiating to right lower extremity over the last couple of days.  Patient was recently in the ER with symptoms of upper respiratory tract on was diagnosed on February 2 with Covid infection. Pt admitted for R LE pain/sciatica.    PT Comments    Pt progressing well.  She states she is going home tomorrow.   Her pain with mobility appears improved. She adamantly refuses SNF at this time.  Will continue to follow in acute setting.  Recommend HHPT vs OPPT to address back pain if pt d/c's home.  Follow Up Recommendations  Home health PT;vs OPPT Other (comment) (pt admantly declines SNF at this time)     Equipment Recommendations  3in1 (PT)    Recommendations for Other Services       Precautions / Restrictions Precautions Precautions: Fall Restrictions Weight Bearing Restrictions: No    Mobility  Bed Mobility Overal bed mobility: Needs Assistance Bed Mobility: Rolling;Sidelying to Sit Rolling: Supervision Sidelying to sit: Min assist;Min guard       General bed mobility comments: incr time, cues to log roll, use of rail, light assist to fully elevate trunk    Transfers Overall transfer level: Needs assistance Equipment used: Rolling walker (2 wheeled) Transfers: Sit to/from [facility] Sit to Stand: Supervision Stand pivot transfers: Supervision       General transfer comment: incr time, supervision for safety  Ambulation/Gait             General Gait Details: deferred d/t RLE pain   Stairs             Wheelchair Mobility    Modified Rankin (Stroke Patients Only)       Balance   Sitting-balance support: No upper extremity  supported Sitting balance-Leahy Scale: Fair Sitting balance - Comments: wat shifting limited d/t back pain   Standing balance support: During functional activity;Single extremity supported Standing balance-Leahy Scale: Fair Standing balance comment: fair static                            Cognition Arousal/Alertness: Awake/alert Behavior During Therapy: WFL for tasks assessed/performed Overall Cognitive Status: Within Functional Limits for tasks assessed                                 General Comments: pt had tylenol prior to PT, pt requests more meds but cannot have any at this time per RN. does not appear to be in excessive amount of pain, reports RLE hypersenstive to touch, sheis able to WB on RLE      Exercises      General Comments        Pertinent Vitals/Pain Pain Assessment: Faces Faces Pain Scale: Hurts a little bit Pain Location: back and R LE Pain Descriptors / Indicators:  (does not describe) Pain Intervention(s): Limited activity within patient's tolerance;Monitored during session;Premedicated before session;Repositioned    Home Living                      Prior Function            PT Goals (current goals  can now be found in the care plan section) Acute Rehab PT Goals Patient Stated Goal: decrease pain PT Goal Formulation: With patient Time For Goal Achievement: 05/26/20 Potential to Achieve Goals: Fair Progress towards PT goals: Progressing toward goals    Frequency    Min 3X/week      PT Plan Discharge plan needs to be updated    Co-evaluation              AM-PAC PT "6 Clicks" Mobility   Outcome Measure  Help needed turning from your back to your side while in a flat bed without using bedrails?: A Little Help needed moving from lying on your back to sitting on the side of a flat bed without using bedrails?: A Little Help needed moving to and from a bed to a chair (including a wheelchair)?: A  Little Help needed standing up from a chair using your arms (e.g., wheelchair or bedside chair)?: A Little Help needed to walk in hospital room?: A Little Help needed climbing 3-5 steps with a railing? : A Lot 6 Click Score: 17    End of Session   Activity Tolerance: Patient tolerated treatment well;Patient limited by pain Patient left: in chair;with call bell/phone within reach   PT Visit Diagnosis: Unsteadiness on feet (R26.81);Muscle weakness (generalized) (M62.81);Pain Pain - Right/Left: Right Pain - part of body: Leg     Time: 7408-1448 PT Time Calculation (min) (ACUTE ONLY): 23 min  Charges:  $Therapeutic Activity: 23-37 mins                     Baxter Flattery, PT  Acute Rehab Dept ([facility]) [phone] Pager [phone]  05/22/2020    [facility] [place] 05/22/2020, 2:16 PM

## 2020-05-22 NOTE — Care Management Important Message (Signed)
Important Message  Patient Details IM Letter given to the Patient. Name: Kayla [name] MRN: [id number] Date of Birth: 1945/11/30   Medicare Important Message Given:  Yes     Kerin Salen 05/22/2020, 11:20 AM

## 2020-05-22 NOTE — TOC Progression Note (Signed)
Transition of Care [facility]) - Progression Note    Patient Details  Name: KENIDI ELENBAAS MRN: [id number] Date of Birth: [birth year]  Transition of Care [facility]) CM/SW [place], [place] Phone Number: 05/22/2020, 3:18 PM  Clinical Narrative:   According to both MD and PT, patient now declines SNF bed, states she will return home with [facility] services and DME.  Orders seen and appreciated. Spoke with Andee Poles at [facility] who will arrange for delivery of DME.  Spoke with Cindie with [facility] who will provide HH PT. TOC will continue to follow during the course of hospitalization.     Expected Discharge Plan: [place] Barriers to Discharge: Barriers Resolved  Expected Discharge Plan and Services Expected Discharge Plan: [place] In-house Referral: NA Discharge Planning Services: NA Post Acute Care Choice: [place] Living arrangements for the past 2 months: Single Family Home                 DME Arranged: N/A DME Agency: NA       HH Arranged: NA HH Agency: NA         Social Determinants of Health (SDOH) Interventions    Readmission Risk Interventions No flowsheet data found.

## 2020-05-22 NOTE — Progress Notes (Signed)
Patient ID: Kayla [name], female   DOB: 1945-08-06, 75 y.o.   MRN: [id number] IR staff came to pt's room to bring her downstairs for L3-4 epi injection. Pt said she could not lie prone for procedure therefore procedure not performed. Please contact IR at [phone] if/when pt wishes to proceed. Next available date would be on 2/17; need to hold lovenox day of injection.

## 2020-05-23 DIAGNOSIS — M5431 Sciatica, right side: Secondary | ICD-10-CM | POA: Diagnosis not present
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
## 2020-05-24 ENCOUNTER — Inpatient Hospital Stay (HOSPITAL_COMMUNITY): Payer: Medicare PPO

## 2020-05-24 DIAGNOSIS — M5431 Sciatica, right side: Secondary | ICD-10-CM | POA: Diagnosis not present

## 2020-05-24 NOTE — Consult Note (Signed)
Reason for Consult: Lumbar herniated disc, lumbar radiculopathy Referring Physician: Dr. Sanda Linger Kayla [name] [name] is an 75 y.o. female.  HPI: Kayla [name] [name] is a 75 year old white female who was admitted last week secondary to Covid and a lumbar radiculopathy.  She was worked up with a lumbar MRI which demonstrated chronic changes/stenosis at L4-5 with an acute appearing ruptured disc at L3-4 on Kayla [name] right.  Attempts were made at medical management and discharge but Kayla [name] [name] failed.  Neurosurgical consultation was requested.  Presently Kayla [name] [name] complains of severe right anterior leg pain.  He does not complain of any left-sided radicular pain.  She does not feel she is achieving adequate pain management with medications.  She could not lie prone for a epidural injection.  Past Medical History:  Diagnosis Date   Anemia    Anxiety    Asthma    B12 deficiency    Bursitis    Chronic LBP    Chronic UTI    Diastolic dysfunction    Fibromyalgia    GERD (gastroesophageal reflux disease)    Hyperlipemia    Hypertension    Hypothyroid    IBS (irritable bowel syndrome)    Inflammatory osteoarthritis    Kidney infection    Psoriatic arthritis (Kayla [name] [name])    S/P cardiac cath 11/30/2007   normal coronaries - Dr. Burt [name] (Dr. Debara [name] reviewed films on 01/10/2016)    Past Surgical History:  Procedure Laterality Date   CESAREAN SECTION  06/10/1975   CHOLECYSTECTOMY     NECK SURGERY  1998   RADIOLOGY WITH ANESTHESIA N/A 05/16/2020   Procedure: MRI LUMBAR SPINE W/CONTRAST;  Surgeon: Radiologist, Medication, MD;  Location: [facility];  Service: Radiology;  Laterality: N/A;   repair broken C6 & C7      Family History  Problem Relation Age of Onset   Heart attack Father 79   Lung cancer Other        uncle, non smoker   Leukemia Other        uncle   Cancer Mother        oral   Stroke Mother    Alzheimer's disease Mother        at 51   Cancer Maternal Aunt        breast    Alcohol abuse Maternal Aunt    Alcohol abuse Maternal Uncle    Cancer Maternal Uncle        lung, stomach, oral   Cancer Paternal Uncle        lung   Congestive Heart Failure Maternal Grandfather    Heart disease Maternal Grandfather    Heart attack Paternal Grandfather    Cancer Paternal Uncle        bone   Brain cancer Maternal Grandmother    Alzheimer's disease Other        maternal great grandmother    Colon cancer Neg Hx    Breast cancer Neg Hx    Diabetes Neg Hx     Social History:  reports that she has never smoked. She has never used smokeless tobacco. She reports that she does not drink alcohol and does not use drugs.  Allergies:  Allergies  Allergen Reactions   Ace Inhibitors Anaphylaxis    REACTION: glossal edema   Lisinopril Anaphylaxis   Other Nausea Only    Medications:  Kayla [name] have reviewed Kayla [name] [name]'s current medications. Prior to Admission:  Medications Prior to Admission  Medication Sig Dispense Refill Last Dose   acetaminophen (  TYLENOL) 500 MG tablet Take 1,000 mg by mouth in Kayla [name] morning, at noon, in Kayla [name] evening, and at bedtime.   05/10/2020 at Unknown time   Ascorbic Acid (VITAMIN C) 1000 MG tablet Take 500 mg by mouth daily.   05/10/2020   Calcium-Magnesium-Zinc (CAL-MAG-ZINC PO) Take by mouth 2 (two) times daily.   05/10/2020 at Unknown time   chlorthalidone (HYGROTON) 25 MG tablet TAKE 1 TABLET BY MOUTH EVERY DAY ([name] taking differently: Take 25 mg by mouth daily.) 90 tablet 3 05/10/2020 at Unknown time   Cholecalciferol (VITAMIN D3) 25 MCG (1000 UT) CAPS Take 1,000 Units by mouth daily.   05/10/2020   clotrimazole (LOTRIMIN) 1 % cream Apply to affected area 2 times daily 15 g 0 Past Month at Unknown time   cyanocobalamin ([employer] VITAMIN B12) 1000 MCG tablet Take 1 tablet (1,000 mcg total) by mouth daily. 90 tablet 1 05/10/2020 at Unknown time   ferrous sulfate 325 (65 FE) MG tablet Take 325 mg by mouth 2 (two) times daily with a meal.    05/10/2020   levothyroxine (SYNTHROID) 125 MCG tablet Take 1 tablet (125 mcg total) by mouth daily. 30 tablet 1 05/10/2020 at Unknown time   miconazole (MICOTIN) 2 % powder Apply topically as needed for itching. 70 g 0 Past Month at Unknown time   nystatin (MYCOSTATIN/NYSTOP) powder Apply 1 application topically 3 (three) times daily. 60 g 0 Past Month at Unknown time   [EXPIRED] predniSONE (DELTASONE) 20 MG tablet Take 3 tablets (60 mg total) by mouth daily with breakfast for 2 days, THEN 2 tablets (40 mg total) daily with breakfast for 2 days, THEN 1 tablet (20 mg total) daily with breakfast for 2 days, THEN 0.5 tablets (10 mg total) daily with breakfast for 2 days. 13 tablet 0 05/10/2020   pyridOXINE (VITAMIN B-6) 100 MG tablet Take 100 mg by mouth daily.   05/10/2020 at Unknown time   tiZANidine (ZANAFLEX) 2 MG tablet Take 1 tablet (2 mg total) by mouth every 8 (eight) hours as needed for muscle spasms. 15 tablet 0 Past Month at Unknown time   triamcinolone (KENALOG) 0.1 % Apply 1 application topically 2 (two) times daily. 45 each 0 Past Month at Unknown time   zinc gluconate 50 MG tablet Take 50 mg by mouth daily.   05/10/2020   aspirin EC 81 MG tablet Take 1 tablet (81 mg total) by mouth daily. ([name] not taking: Reported on 05/13/2020)   Not Taking at Unknown time   cetirizine (ZYRTEC ALLERGY) 10 MG tablet Take 1 tablet (10 mg total) by mouth daily. ([name] not taking: Reported on 05/13/2020) 30 tablet 0 Not Taking at Unknown time   diltiazem (TIAZAC) 360 MG 24 hr capsule Take 1 capsule (360 mg total) by mouth daily. ([name] not taking: Reported on 05/13/2020) 90 capsule 1 Not Taking at Unknown time   fluticasone (FLONASE) 50 MCG/ACT nasal spray Place 1 spray into both nostrils daily. ([name] not taking: Reported on 05/13/2020) 16 g 0 Not Taking at Unknown time   guaiFENesin (MUCINEX) 600 MG 12 hr tablet Take 1 tablet (600 mg total) by mouth 2 (two) times daily. ([name] not taking: Reported on  05/13/2020) 60 tablet 2 Not Taking at Unknown time   lidocaine (LIDODERM) 5 % Place 1 patch onto Kayla [name] skin daily. Remove & Discard patch within 12 hours or as directed by MD ([name] not taking: Reported on 05/13/2020) 30 patch 0 Not Taking at Unknown time   Scheduled:  amLODipine  5 mg Oral Daily   aspirin EC  81 mg Oral Daily   enoxaparin (LOVENOX) injection  40 mg Subcutaneous Q24H   ferrous sulfate  325 mg Oral Q breakfast   HYDROcodone-acetaminophen  1 tablet Oral Q6H   levothyroxine  125 mcg Oral Q0600   lidocaine  1 patch Transdermal Q24H   magnesium oxide  400 mg Oral BID   methocarbamol  750 mg Oral TID   senna-docusate  1 tablet Oral QHS   cyanocobalamin  1,000 mcg Oral Daily   Continuous:  sodium chloride 10 mL/hr at 05/12/20 2018   LDJ:TTSVXB chloride, acetaminophen **OR** acetaminophen, hydrALAZINE, lip balm, morphine injection, Muscle Rub, ondansetron (ZOFRAN) IV, polyethylene glycol, traZODone Anti-infectives (From admission, onward)   Start     Dose/Rate Route Frequency Ordered Stop   05/13/20 1000  remdesivir 100 mg in sodium chloride 0.9 % 100 mL IVPB       "Followed by" Linked Group Details   100 mg 200 mL/hr over 30 Minutes Intravenous Daily 05/12/20 0629 05/16/20 1447   05/12/20 0700  remdesivir 200 mg in sodium chloride 0.9% 250 mL IVPB       "Followed by" Linked Group Details   200 mg 580 mL/hr over 30 Minutes Intravenous Once 05/12/20 0629 05/12/20 0804       No results found for this or any previous visit (from Kayla [name] past 39 hour(s)).  No results found.  ROS as above Blood pressure (!) 141/95, pulse 81, temperature 98 F (36.7 C), resp. rate 20, height 5\' 4"  (1.626 m), weight 97.5 kg, SpO2 98 %. Estimated body mass index is 36.9 kg/m as calculated from Kayla [name] following:   Height as of this encounter: 5\' 4"  (1.626 m).   Weight as of this encounter: 97.5 kg.  Physical Exam  General: An alert and pleasant obese 75 year old white female at times  tearful  HEENT: Normocephalic, extraocular muscles intact  Neck: Unremarkable  Thorax: Symmetric  Abdomen: Obese and soft  Extremities: Unremarkable  Neurologic exam: Kayla [name] [name] is alert and oriented x3.  Cranial nerves are grossly intact except she has diminished hearing.  Her motor strength is normal except in her right psoas and quadricep is at approximately 4+/5.  She is hyperesthetic in Kayla [name] right L3 distribution.  Back exam: She has a positive right straight leg raise test.  Kayla [name] reviewed Kayla [name] [name]'s lumbar MRI performed at [facility].  It demonstrates Kayla [name] [name] has an L4-5 spondylolisthesis with spinal stenosis which appears to be chronic.  She has a right L3-4 far lateral herniated disc with foraminal stenosis and compression of Kayla [name] right L3 nerve root.  Assessment/Plan: Right L3-4 herniated disc, right lumbar radiculopathy, L4-5 spondylolisthesis, spinal stenosis: Kayla [name] have discussed Kayla [name] situation with Kayla [name] [name].  Kayla [name] have told her Kayla [name] think Kayla [name] changes at L4-5 are likely chronic and not what brought her to Kayla [name] hospital.  Kayla [name] think she is suffering from acute right L3 radiculopathy caused by Kayla [name] herniated disc at L3-4.  We have discussed Kayla [name] various treatment options including doing nothing, continue medical management, lumbar injections, and surgery.  Kayla [name] have described Kayla [name] surgical treatment option of a right L3-4 discectomy.  Kayla [name] described Kayla [name] surgery.  We discussed Kayla [name] risks including risk of anesthesia, hemorrhage, infection, spinal fluid leak, recurrent herniated disc, medical risk, failure to relieve Kayla [name] pain, worsening pain, etc.  We also discussed Kayla [name] expected postoperative course, Kayla [name].e. typically this is a surgery in which we send Kayla [name] [name] home Kayla [name] same  day.  Kayla [name] have answered all her questions.  She has decided proceed with surgery.  We will see if we can get this arranged.  This will require transfer to [facility] hospitalist service today or tomorrow morning for  surgery tomorrow afternoon.  Kayla [name] will discuss this with Kayla [name] hospitalist.  Ophelia Charter 05/24/2020, 9:05 AM

## 2020-05-24 NOTE — Progress Notes (Signed)
Patient transferred to [facility] via carelink.

## 2020-05-24 NOTE — Progress Notes (Signed)
Patient for transfer to [facility] 3W. Bed ready. Report given to receiving RN, Sherlynn Stalls.

## 2020-05-24 NOTE — Progress Notes (Signed)
Physical Therapy Treatment Patient Details Name: Kayla [name] MRN: [id number] DOB: 05-18-1945 Today's Date: 05/24/2020    History of Present Illness Pt is a 75 y.o. female with history of hypertension, hypothyroidism nonobstructive CAD presents to the ER with complaints of worsening low back pain radiating to right lower extremity over the last couple of days.  Patient was recently in the ER with symptoms of upper respiratory tract on was diagnosed on February 2 with Covid infection. Pt admitted for R LE pain/sciatica. Neurosurgery planning surgery at [facility] for 05/25/20.    PT Comments    Pt ambulated 12' with RW, distance limited by RLE pain. Noted plan for surgery tomorrow 05/25/20.   Follow Up Recommendations  Home health PT;Other (comment) (pt admantly declines SNF at this time)     Equipment Recommendations  3in1 (PT)    Recommendations for Other Services       Precautions / Restrictions Precautions Precautions: Fall;Back Precaution Comments: back pain Restrictions Weight Bearing Restrictions: No    Mobility  Bed Mobility   Bed Mobility: Sit to Supine       Sit to supine: Min assist   General bed mobility comments: min A for LEs into bed    Transfers Overall transfer level: Needs assistance Equipment used: Rolling walker (2 wheeled) Transfers: Sit to/from [facility] Sit to Stand: Supervision Stand pivot transfers: Supervision       General transfer comment: incr time, supervision for safety  Ambulation/Gait Ambulation/Gait assistance: Min guard Gait Distance (Feet): 12 Feet Assistive device: Rolling walker (2 wheeled)   Gait velocity: decr   General Gait Details: steady with RW, distance limited by RLE pain   Stairs             Wheelchair Mobility    Modified Rankin (Stroke Patients Only)       Balance Overall balance assessment: Needs assistance Sitting-balance support: No upper extremity supported Sitting balance-Leahy  Scale: Fair Sitting balance - Comments: seated EOB   Standing balance support: During functional activity;Single extremity supported Standing balance-Leahy Scale: Poor Standing balance comment: reliant on UE support                            Cognition Arousal/Alertness: Awake/alert Behavior During Therapy: WFL for tasks assessed/performed Overall Cognitive Status: Within Functional Limits for tasks assessed                                        Exercises      General Comments        Pertinent Vitals/Pain Pain Score: 7  Pain Location: back and R LE Pain Descriptors / Indicators: Grimacing;Spasm Pain Intervention(s): Limited activity within patient's tolerance;Monitored during session;Patient requesting pain meds-RN notified    Home Living                      Prior Function            PT Goals (current goals can now be found in the care plan section) Acute Rehab PT Goals Patient Stated Goal: decrease pain PT Goal Formulation: With patient Time For Goal Achievement: 05/26/20 Potential to Achieve Goals: Fair Additional Goals Additional Goal #1: Will decrease pain to 3/10 to tolerate activity Progress towards PT goals: Progressing toward goals    Frequency    Min 3X/week  PT Plan Current plan remains appropriate    Co-evaluation              AM-PAC PT "6 Clicks" Mobility   Outcome Measure  Help needed turning from your back to your side while in a flat bed without using bedrails?: A Little Help needed moving from lying on your back to sitting on the side of a flat bed without using bedrails?: A Little Help needed moving to and from a bed to a chair (including a wheelchair)?: A Little Help needed standing up from a chair using your arms (e.g., wheelchair or bedside chair)?: A Little Help needed to walk in hospital room?: A Little Help needed climbing 3-5 steps with a railing? : A Lot 6 Click Score: 17     End of Session   Activity Tolerance: Patient tolerated treatment well;Patient limited by pain Patient left: with call bell/phone within reach;in bed Nurse Communication: Mobility status;Patient requests pain meds PT Visit Diagnosis: Unsteadiness on feet (R26.81);Muscle weakness (generalized) (M62.81);Pain Pain - Right/Left: Right Pain - part of body: Leg     Time: 1219-1230 PT Time Calculation (min) (ACUTE ONLY): 11 min  Charges:  $Gait Training: 8-22 mins                     Blondell Reveal Kistler PT 05/24/2020  Acute Rehabilitation Services Pager [phone] Office [phone]
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
## 2020-05-24 NOTE — Progress Notes (Signed)
RN offered night time scheduled medications. Patient refused to take at this time. Verbalized she prefer taking it upon arrival at [facility] hospital.

## 2020-05-24 NOTE — Progress Notes (Signed)
Pt arrived on the unit a transfer from [facility] c/o excruciating pain lower back radiating to rt knee leg and ankle rated 10/10

## 2020-05-24 NOTE — Progress Notes (Signed)
Chaplain engaged in an initial visit with Kayla [name].  When chaplain walked in Kayla [name] was crying because she had just got the news that a friend of hers passed away.  She stated that her friend had been suffering with throat cancer.  As a believer in God, she finds peace in knowing that her friend went to heaven but she is still deeply thinking about her friend's children and family.  This moment also brought up a lot for Kayla [name] in reference to her own immortality and life.  She began to recite the illnesses plaguing her body and what has brought her into the hospital.  She voiced to the chaplain that she finds comfort in knowing she will go to heaven.  She stated that "Jesus is coming back," to acknowledge her time left.  Kayla [name] later discussed her strength to keep living and going in life.  As she talked about her grandchildren, she also developed a great amount of hope for her future.  She desires to be strong and capable for them.   Outside of discussing her health, Kayla [name] recounted her life.  She has experienced some significant losses, which have included her husband and her daughter-in-law.  Their deaths have impacted her and her family tremendously.  After her husband died, Kayla [name] found herself having to rebuild her life and experienced a grief that often left her checked out or "on another planet."  Kayla [name] has also witnessed how hard it has been for her grandchildren through the loss of their mother.  Kayla [name] is the only "mother" they know at this time.  Kayla [name]'s son also has some of his own health challenges.  They all live together and she is the primary caregiver of them all. They are not foreign to experiencing grief and major transitions as a unit.  Kayla [name] talked about her want to get her grandchildren into [facility] counseling services.  Chaplain could assess that Kayla [name] wants to take care of her family.  She wants to continue to show up for them and fight to get better for them.  Chaplain could assess throughout time with Kayla [name] that  she and her family find it hard to discuss death and change because of their traumatic experiences.  Chaplain affirmed what Kayla [name] has learned throughout her life and how that could be utilized to prepare herself and her family for the future.  Kayla [name] holds a major role within her family system and conveyed that if she is not there, there are possibilities of homelessness or going without that exists for her son and his children.    Kayla [name] described her parents as "saints" and credits her father for the ways in which she cares for others.  Kayla [name] has been in the field of Social Work education, and worked as a Engineer, maintenance (IT) for [employer].  Her father often did things for others in their community.  Kayla [name] values her [place] roots and utilizes faith and hope as a way to continue on.   Chaplain offered ministries of presence, support, and listening.  Chaplain is available to follow-up as needed.    05/24/20 1200  Clinical Encounter Type  Visited With Patient  Visit Type Initial;Spiritual support

## 2020-05-25 ENCOUNTER — Inpatient Hospital Stay (HOSPITAL_COMMUNITY): Payer: Medicare PPO | Admitting: Certified Registered"

## 2020-05-25 ENCOUNTER — Encounter (HOSPITAL_COMMUNITY): Payer: Self-pay | Admitting: Internal Medicine

## 2020-05-25 ENCOUNTER — Inpatient Hospital Stay (HOSPITAL_COMMUNITY): Payer: Medicare PPO

## 2020-05-25 ENCOUNTER — Encounter (HOSPITAL_COMMUNITY)
Admission: EM | Disposition: A | Discharge status: Home / Self Care | Payer: Self-pay | Source: Home / Self Care | Attending: Internal Medicine

## 2020-05-25 DIAGNOSIS — N179 Acute kidney failure, unspecified: Secondary | ICD-10-CM | POA: Diagnosis not present

## 2020-05-25 DIAGNOSIS — M5126 Other intervertebral disc displacement, lumbar region: Secondary | ICD-10-CM | POA: Diagnosis present

## 2020-05-25 DIAGNOSIS — M5431 Sciatica, right side: Secondary | ICD-10-CM | POA: Diagnosis not present

## 2020-05-25 HISTORY — PX: LUMBAR LAMINECTOMY/DECOMPRESSION MICRODISCECTOMY: SHX5026

## 2020-05-25 LAB — CBC WITH DIFFERENTIAL/PLATELET
Abs Immature Granulocytes: 0.06 10*3/uL (ref 0.00–0.07)
Basophils Absolute: 0 10*3/uL (ref 0.0–0.1)
Basophils Relative: 0 %
Eosinophils Absolute: 0.3 10*3/uL (ref 0.0–0.5)
Eosinophils Relative: 2 %
HCT: 42.2 % (ref 36.0–46.0)
Hemoglobin: 14.3 g/dL (ref 12.0–15.0)
Immature Granulocytes: 0 %
Lymphocytes Relative: 22 %
Lymphs Abs: 3.2 10*3/uL (ref 0.7–4.0)
MCH: 33.3 pg (ref 26.0–34.0)
MCHC: 33.9 g/dL (ref 30.0–36.0)
MCV: 98.1 fL (ref 80.0–100.0)
Monocytes Absolute: 1.1 10*3/uL — ABNORMAL HIGH (ref 0.1–1.0)
Monocytes Relative: 7 %
Neutro Abs: 9.9 10*3/uL — ABNORMAL HIGH (ref 1.7–7.7)
Neutrophils Relative %: 69 %
Platelets: 294 10*3/uL (ref 150–400)
RBC: 4.3 MIL/uL (ref 3.87–5.11)
RDW: 13.5 % (ref 11.5–15.5)
WBC: 14.4 10*3/uL — ABNORMAL HIGH (ref 4.0–10.5)
nRBC: 0 % (ref 0.0–0.2)

## 2020-05-25 LAB — COMPREHENSIVE METABOLIC PANEL
ALT: 26 U/L (ref 0–44)
AST: 18 U/L (ref 15–41)
Albumin: 3.2 g/dL — ABNORMAL LOW (ref 3.5–5.0)
Alkaline Phosphatase: 46 U/L (ref 38–126)
Anion gap: 10 (ref 5–15)
BUN: 13 mg/dL (ref 8–23)
CO2: 25 mmol/L (ref 22–32)
Calcium: 9.1 mg/dL (ref 8.9–10.3)
Chloride: 105 mmol/L (ref 98–111)
Creatinine, Ser: 1.22 mg/dL — ABNORMAL HIGH (ref 0.44–1.00)
GFR, Estimated: 46 mL/min — ABNORMAL LOW (ref >60)
Glucose, Bld: 104 mg/dL — ABNORMAL HIGH (ref 70–99)
Potassium: 3.7 mmol/L (ref 3.5–5.1)
Sodium: 140 mmol/L (ref 135–145)
Total Bilirubin: 1 mg/dL (ref 0.3–1.2)
Total Protein: 5.8 g/dL — ABNORMAL LOW (ref 6.5–8.1)

## 2020-05-25 LAB — URINALYSIS, ROUTINE W REFLEX MICROSCOPIC
Bilirubin Urine: NEGATIVE
Glucose, UA: NEGATIVE mg/dL
Ketones, ur: NEGATIVE mg/dL
Nitrite: NEGATIVE
Protein, ur: NEGATIVE mg/dL
Specific Gravity, Urine: 1.008 (ref 1.005–1.030)
WBC, UA: >50 WBC/hpf — ABNORMAL HIGH (ref 0–5)
pH: 7 (ref 5.0–8.0)

## 2020-05-25 LAB — SURGICAL PCR SCREEN
MRSA, PCR: NEGATIVE
Staphylococcus aureus: POSITIVE — AB

## 2020-05-25 SURGERY — LUMBAR LAMINECTOMY/DECOMPRESSION MICRODISCECTOMY 1 LEVEL
Anesthesia: General | Site: Back | Laterality: Right

## 2020-05-25 MED ORDER — 0.9 % SODIUM CHLORIDE (POUR BTL) OPTIME
TOPICAL | Status: DC | PRN
  Administered 2020-05-25: 1000 mL

## 2020-05-25 MED ORDER — BACITRACIN ZINC 500 UNIT/GM EX OINT
TOPICAL_OINTMENT | CUTANEOUS | Status: AC
  Filled 2020-05-25: qty 28.35

## 2020-05-25 MED ORDER — MENTHOL 3 MG MT LOZG: 1.0000 | LOZENGE | OROMUCOSAL | Status: DC | PRN

## 2020-05-25 MED ORDER — SODIUM CHLORIDE 0.9 % IV SOLN
250.0000 mL | INTRAVENOUS | Status: DC
  Administered 2020-05-25: 250 mL via INTRAVENOUS

## 2020-05-25 MED ORDER — PROPOFOL 10 MG/ML IV BOLUS
INTRAVENOUS | Status: DC | PRN
Start: 2020-05-25 — End: 2020-05-25
  Administered 2020-05-25: 120 mg via INTRAVENOUS

## 2020-05-25 MED ORDER — BUPIVACAINE HCL 0.5 % IJ SOLN
INTRAMUSCULAR | Status: DC | PRN
  Administered 2020-05-25: 10 mL

## 2020-05-25 MED ORDER — OXYCODONE HCL 5 MG PO TABS
10.0000 mg | ORAL_TABLET | ORAL | Status: DC | PRN
Start: 2020-05-25 — End: 2020-05-30
  Administered 2020-05-26 – 2020-05-30 (×12): 10 mg via ORAL
  Filled 2020-05-25 (×12): qty 2

## 2020-05-25 MED ORDER — CEFAZOLIN SODIUM-DEXTROSE 2-3 GM-%(50ML) IV SOLR
INTRAVENOUS | Status: DC | PRN
  Administered 2020-05-25: 2 g via INTRAVENOUS

## 2020-05-25 MED ORDER — ONDANSETRON HCL 4 MG/2ML IJ SOLN: 4.0000 mg | Freq: Four times a day (QID) | INTRAMUSCULAR | Status: DC | PRN

## 2020-05-25 MED ORDER — SUGAMMADEX SODIUM 200 MG/2ML IV SOLN
INTRAVENOUS | Status: DC | PRN
  Administered 2020-05-25: 200 mg via INTRAVENOUS

## 2020-05-25 MED ORDER — MUPIROCIN 2 % EX OINT
1.0000 "application " | TOPICAL_OINTMENT | Freq: Two times a day (BID) | CUTANEOUS | Status: AC
  Administered 2020-05-25 – 2020-05-29 (×10): 1 via NASAL
  Filled 2020-05-25 (×2): qty 22

## 2020-05-25 MED ORDER — BUPIVACAINE-EPINEPHRINE (PF) 0.25% -1:200000 IJ SOLN
INTRAMUSCULAR | Status: AC
  Filled 2020-05-25: qty 10

## 2020-05-25 MED ORDER — HEMOSTATIC AGENTS (NO CHARGE) OPTIME
TOPICAL | Status: DC | PRN
  Administered 2020-05-25: 1 via TOPICAL

## 2020-05-25 MED ORDER — DOCUSATE SODIUM 100 MG PO CAPS
100.0000 mg | ORAL_CAPSULE | Freq: Two times a day (BID) | ORAL | Status: DC
  Administered 2020-05-27: 100 mg via ORAL
  Filled 2020-05-25 (×7): qty 1

## 2020-05-25 MED ORDER — PHENYLEPHRINE HCL-NACL 10-0.9 MG/250ML-% IV SOLN
INTRAVENOUS | Status: DC | PRN
  Administered 2020-05-25: 25 ug/min via INTRAVENOUS

## 2020-05-25 MED ORDER — MORPHINE SULFATE (PF) 4 MG/ML IV SOLN
4.0000 mg | INTRAVENOUS | Status: DC | PRN
  Administered 2020-05-26 – 2020-05-30 (×11): 4 mg via INTRAVENOUS
  Filled 2020-05-25 (×11): qty 1

## 2020-05-25 MED ORDER — CEFAZOLIN SODIUM-DEXTROSE 2-4 GM/100ML-% IV SOLN
2.0000 g | Freq: Three times a day (TID) | INTRAVENOUS | Status: AC
Start: 2020-05-26 — End: 2020-05-26
  Administered 2020-05-26 (×2): 2 g via INTRAVENOUS
  Filled 2020-05-25 (×2): qty 100

## 2020-05-25 MED ORDER — CEFAZOLIN SODIUM 1 G IJ SOLR
INTRAMUSCULAR | Status: AC
  Filled 2020-05-25: qty 20

## 2020-05-25 MED ORDER — DEXAMETHASONE SODIUM PHOSPHATE 10 MG/ML IJ SOLN
INTRAMUSCULAR | Status: AC
  Filled 2020-05-25: qty 1

## 2020-05-25 MED ORDER — CHLORHEXIDINE GLUCONATE 0.12 % MT SOLN
OROMUCOSAL | Status: AC
  Administered 2020-05-25: 15 mL via OROMUCOSAL
  Filled 2020-05-25: qty 15

## 2020-05-25 MED ORDER — ZOLPIDEM TARTRATE 5 MG PO TABS: 5.0000 mg | ORAL_TABLET | Freq: Every evening | ORAL | Status: DC | PRN

## 2020-05-25 MED ORDER — CYCLOBENZAPRINE HCL 10 MG PO TABS
10.0000 mg | ORAL_TABLET | Freq: Three times a day (TID) | ORAL | Status: DC | PRN
  Administered 2020-05-26 – 2020-05-30 (×6): 10 mg via ORAL
  Filled 2020-05-25 (×6): qty 1

## 2020-05-25 MED ORDER — SODIUM CHLORIDE 0.9% FLUSH: 3.0000 mL | INTRAVENOUS | Status: DC | PRN

## 2020-05-25 MED ORDER — LACTATED RINGERS IV SOLN: INTRAVENOUS | Status: DC

## 2020-05-25 MED ORDER — BUPIVACAINE-EPINEPHRINE (PF) 0.5% -1:200000 IJ SOLN
INTRAMUSCULAR | Status: DC | PRN
  Administered 2020-05-25: 10 mL via PERINEURAL

## 2020-05-25 MED ORDER — SODIUM CHLORIDE 0.9% FLUSH
3.0000 mL | Freq: Two times a day (BID) | INTRAVENOUS | Status: DC
  Administered 2020-05-25 – 2020-05-30 (×9): 3 mL via INTRAVENOUS

## 2020-05-25 MED ORDER — BACITRACIN ZINC 500 UNIT/GM EX OINT
TOPICAL_OINTMENT | CUTANEOUS | Status: DC | PRN
  Administered 2020-05-25: 1 via TOPICAL

## 2020-05-25 MED ORDER — THROMBIN 5000 UNITS EX SOLR
CUTANEOUS | Status: DC | PRN
  Administered 2020-05-25 (×2): 5000 [IU] via TOPICAL

## 2020-05-25 MED ORDER — THROMBIN 5000 UNITS EX SOLR: OROMUCOSAL | Status: DC | PRN

## 2020-05-25 MED ORDER — PHENOL 1.4 % MT LIQD: 1.0000 | OROMUCOSAL | Status: DC | PRN

## 2020-05-25 MED ORDER — ACETAMINOPHEN 325 MG PO TABS
650.0000 mg | ORAL_TABLET | ORAL | Status: DC | PRN
  Administered 2020-05-28: 650 mg via ORAL
  Filled 2020-05-25: qty 2

## 2020-05-25 MED ORDER — FENTANYL CITRATE (PF) 250 MCG/5ML IJ SOLN
INTRAMUSCULAR | Status: AC
  Filled 2020-05-25: qty 5

## 2020-05-25 MED ORDER — ROCURONIUM BROMIDE 10 MG/ML (PF) SYRINGE
PREFILLED_SYRINGE | INTRAVENOUS | Status: DC | PRN
  Administered 2020-05-25: 60 mg via INTRAVENOUS
  Administered 2020-05-25: 10 mg via INTRAVENOUS

## 2020-05-25 MED ORDER — PROPOFOL 10 MG/ML IV BOLUS
INTRAVENOUS | Status: AC
  Filled 2020-05-25: qty 20

## 2020-05-25 MED ORDER — THROMBIN 5000 UNITS EX SOLR
CUTANEOUS | Status: AC
  Filled 2020-05-25: qty 10000

## 2020-05-25 MED ORDER — BUPIVACAINE HCL (PF) 0.5 % IJ SOLN
INTRAMUSCULAR | Status: AC
  Filled 2020-05-25: qty 30

## 2020-05-25 MED ORDER — BISACODYL 10 MG RE SUPP: 10.0000 mg | Freq: Every day | RECTAL | Status: DC | PRN

## 2020-05-25 MED ORDER — ACETAMINOPHEN 500 MG PO TABS
1000.0000 mg | ORAL_TABLET | Freq: Four times a day (QID) | ORAL | Status: AC
  Administered 2020-05-25 – 2020-05-26 (×4): 1000 mg via ORAL
  Filled 2020-05-25 (×4): qty 2

## 2020-05-25 MED ORDER — EPHEDRINE 5 MG/ML INJ
INTRAVENOUS | Status: AC
  Filled 2020-05-25: qty 10

## 2020-05-25 MED ORDER — LACTATED RINGERS IV SOLN: INTRAVENOUS | Status: DC | PRN

## 2020-05-25 MED ORDER — CHLORHEXIDINE GLUCONATE CLOTH 2 % EX PADS
6.0000 | MEDICATED_PAD | Freq: Every day | CUTANEOUS | Status: DC
  Administered 2020-05-25 – 2020-05-27 (×3): 6 via TOPICAL

## 2020-05-25 MED ORDER — FENTANYL CITRATE (PF) 100 MCG/2ML IJ SOLN
INTRAMUSCULAR | Status: DC | PRN
  Administered 2020-05-25: 50 ug via INTRAVENOUS
  Administered 2020-05-25 (×3): 25 ug via INTRAVENOUS

## 2020-05-25 MED ORDER — PHENYLEPHRINE 40 MCG/ML (10ML) SYRINGE FOR IV PUSH (FOR BLOOD PRESSURE SUPPORT)
PREFILLED_SYRINGE | INTRAVENOUS | Status: AC
  Filled 2020-05-25: qty 10

## 2020-05-25 MED ORDER — CHLORHEXIDINE GLUCONATE 0.12 % MT SOLN: 15.0000 mL | Freq: Once | OROMUCOSAL | Status: AC

## 2020-05-25 MED ORDER — OXYCODONE HCL 5 MG PO TABS
5.0000 mg | ORAL_TABLET | ORAL | Status: DC | PRN
Start: 2020-05-25 — End: 2020-05-30
  Administered 2020-05-28 – 2020-05-29 (×3): 5 mg via ORAL
  Filled 2020-05-25 (×3): qty 1

## 2020-05-25 MED ORDER — ONDANSETRON HCL 4 MG/2ML IJ SOLN
INTRAMUSCULAR | Status: AC
  Filled 2020-05-25: qty 2

## 2020-05-25 MED ORDER — LIDOCAINE 2% (20 MG/ML) 5 ML SYRINGE
INTRAMUSCULAR | Status: AC
  Filled 2020-05-25: qty 5

## 2020-05-25 MED ORDER — LIDOCAINE 2% (20 MG/ML) 5 ML SYRINGE
INTRAMUSCULAR | Status: DC | PRN
  Administered 2020-05-25: 100 mg via INTRAVENOUS

## 2020-05-25 MED ORDER — DEXAMETHASONE SODIUM PHOSPHATE 10 MG/ML IJ SOLN
INTRAMUSCULAR | Status: DC | PRN
  Administered 2020-05-25: 5 mg via INTRAVENOUS

## 2020-05-25 MED ORDER — THROMBIN 5000 UNITS EX SOLR
CUTANEOUS | Status: AC
  Filled 2020-05-25: qty 5000

## 2020-05-25 MED ORDER — ROCURONIUM BROMIDE 10 MG/ML (PF) SYRINGE
PREFILLED_SYRINGE | INTRAVENOUS | Status: AC
  Filled 2020-05-25: qty 10

## 2020-05-25 MED ORDER — PHENYLEPHRINE 40 MCG/ML (10ML) SYRINGE FOR IV PUSH (FOR BLOOD PRESSURE SUPPORT)
PREFILLED_SYRINGE | INTRAVENOUS | Status: DC | PRN
  Administered 2020-05-25: 80 ug via INTRAVENOUS
  Administered 2020-05-25: 120 ug via INTRAVENOUS
  Administered 2020-05-25: 80 ug via INTRAVENOUS
  Administered 2020-05-25: 120 ug via INTRAVENOUS

## 2020-05-25 MED ORDER — ONDANSETRON HCL 4 MG/2ML IJ SOLN
INTRAMUSCULAR | Status: DC | PRN
  Administered 2020-05-25: 4 mg via INTRAVENOUS

## 2020-05-25 MED ORDER — ONDANSETRON HCL 4 MG PO TABS: 4.0000 mg | ORAL_TABLET | Freq: Four times a day (QID) | ORAL | Status: DC | PRN

## 2020-05-25 MED ORDER — EPHEDRINE SULFATE-NACL 50-0.9 MG/10ML-% IV SOSY
PREFILLED_SYRINGE | INTRAVENOUS | Status: DC | PRN
  Administered 2020-05-25: 15 mg via INTRAVENOUS

## 2020-05-25 MED ORDER — ACETAMINOPHEN 650 MG RE SUPP: 650.0000 mg | RECTAL | Status: DC | PRN

## 2020-05-25 MED ORDER — POLYETHYLENE GLYCOL 3350 17 G PO PACK: 17.0000 g | PACK | Freq: Every day | ORAL | Status: DC

## 2020-05-25 SURGICAL SUPPLY — 55 items
BAND RUBBER #18 3X1/16 STRL (MISCELLANEOUS) ×4 IMPLANT
BENZOIN TINCTURE PRP APPL 2/3 (GAUZE/BANDAGES/DRESSINGS) ×2 IMPLANT
BLADE CLIPPER SURG (BLADE) IMPLANT
BUR MATCHSTICK NEURO 3.0 LAGG (BURR) ×2 IMPLANT
BUR PRECISION FLUTE 6.0 (BURR) ×2 IMPLANT
CANISTER SUCT 3000ML PPV (MISCELLANEOUS) ×2 IMPLANT
CARTRIDGE OIL MAESTRO DRILL (MISCELLANEOUS) ×1 IMPLANT
CLSR STERI-STRIP ANTIMIC 1/2X4 (GAUZE/BANDAGES/DRESSINGS) ×2 IMPLANT
COVER WAND RF STERILE (DRAPES) IMPLANT
DIFFUSER DRILL AIR PNEUMATIC (MISCELLANEOUS) ×2 IMPLANT
DRAPE LAPAROTOMY 100X72X124 (DRAPES) ×2 IMPLANT
DRAPE MICROSCOPE LEICA (MISCELLANEOUS) ×4 IMPLANT
DRAPE SURG 17X23 STRL (DRAPES) ×8 IMPLANT
DRSG OPSITE POSTOP 4X6 (GAUZE/BANDAGES/DRESSINGS) ×2 IMPLANT
ELECT BLADE 4.0 EZ CLEAN MEGAD (MISCELLANEOUS) ×2
ELECT BLADE 6.5 EXT (BLADE) ×2 IMPLANT
ELECT REM PT RETURN 9FT ADLT (ELECTROSURGICAL) ×2
ELECTRODE BLDE 4.0 EZ CLN MEGD (MISCELLANEOUS) ×1 IMPLANT
ELECTRODE REM PT RTRN 9FT ADLT (ELECTROSURGICAL) ×1 IMPLANT
GAUZE 4X4 16PLY RFD (DISPOSABLE) IMPLANT
GAUZE SPONGE 4X4 12PLY STRL (GAUZE/BANDAGES/DRESSINGS) ×2 IMPLANT
GLOVE BIO SURGEON STRL SZ8 (GLOVE) ×4 IMPLANT
GLOVE BIO SURGEON STRL SZ8.5 (GLOVE) ×4 IMPLANT
GLOVE BIOGEL M 7.0 STRL (GLOVE) ×2 IMPLANT
GLOVE BIOGEL PI ORTHO PRO 7.5 (GLOVE) ×4
GLOVE ECLIPSE 8.0 STRL XLNG CF (GLOVE) ×2 IMPLANT
GLOVE EXAM NITRILE XL STR (GLOVE) IMPLANT
GLOVE PI ORTHO PRO STRL 7.5 (GLOVE) ×4 IMPLANT
GLOVE SRG 8 PF TXTR STRL LF DI (GLOVE) ×1 IMPLANT
GLOVE SURG UNDER POLY LF SZ8 (GLOVE) ×2
GOWN STRL REUS W/ TWL LRG LVL3 (GOWN DISPOSABLE) ×1 IMPLANT
GOWN STRL REUS W/ TWL XL LVL3 (GOWN DISPOSABLE) ×3 IMPLANT
GOWN STRL REUS W/TWL 2XL LVL3 (GOWN DISPOSABLE) IMPLANT
GOWN STRL REUS W/TWL LRG LVL3 (GOWN DISPOSABLE) ×2
GOWN STRL REUS W/TWL XL LVL3 (GOWN DISPOSABLE) ×6
KIT BASIN OR (CUSTOM PROCEDURE TRAY) ×2 IMPLANT
KIT TURNOVER KIT B (KITS) ×2 IMPLANT
NEEDLE HYPO 18GX1.5 BLUNT FILL (NEEDLE) ×2 IMPLANT
NEEDLE HYPO 21X1.5 SAFETY (NEEDLE) IMPLANT
NEEDLE HYPO 22GX1.5 SAFETY (NEEDLE) ×2 IMPLANT
NEEDLE SPNL 18GX3.5 QUINCKE PK (NEEDLE) ×2 IMPLANT
NS IRRIG 1000ML POUR BTL (IV SOLUTION) ×2 IMPLANT
OIL CARTRIDGE MAESTRO DRILL (MISCELLANEOUS) ×2
PACK LAMINECTOMY NEURO (CUSTOM PROCEDURE TRAY) ×2 IMPLANT
PAD ARMBOARD 7.5X6 YLW CONV (MISCELLANEOUS) IMPLANT
PATTIES SURGICAL .5 X1 (DISPOSABLE) IMPLANT
SPONGE SURGIFOAM ABS GEL SZ50 (HEMOSTASIS) ×2 IMPLANT
STRIP CLOSURE SKIN 1/2X4 (GAUZE/BANDAGES/DRESSINGS) ×2 IMPLANT
SUT VIC AB 1 CT1 18XBRD ANBCTR (SUTURE) ×2 IMPLANT
SUT VIC AB 1 CT1 8-18 (SUTURE) ×4
SUT VIC AB 2-0 CP2 18 (SUTURE) ×4 IMPLANT
SYR CONTROL 10ML LL (SYRINGE) ×2 IMPLANT
TOWEL GREEN STERILE (TOWEL DISPOSABLE) ×2 IMPLANT
TOWEL GREEN STERILE FF (TOWEL DISPOSABLE) ×2 IMPLANT
WATER STERILE IRR 1000ML POUR (IV SOLUTION) ×2 IMPLANT

## 2020-05-25 NOTE — Progress Notes (Signed)
Called report to Periop, Pt ready to be taken to surgery

## 2020-05-25 NOTE — Transfer of Care (Signed)
Immediate Anesthesia Transfer of Care Note  Patient: Kayla [name]  Procedure(s) Performed: MICRODISCECTOMY Right Lumbar three - four (Right Back)  Patient Location: PACU  Anesthesia Type:General  Level of Consciousness: sedated and responds to stimulation  Airway & Oxygen Therapy: Patient Spontanous Breathing and Patient connected to nasal cannula oxygen  Post-op Assessment: Report given to RN and Post -op Vital signs reviewed and stable  Post vital signs: Reviewed and stable  Last Vitals:  Vitals Value Taken Time  BP    Temp    Pulse    Resp    SpO2      Last Pain:  Vitals:   05/25/20 1533  TempSrc: Oral  PainSc:       Patients Stated Pain Goal: 1 (53/91/22 5834)  Complications: No complications documented.

## 2020-05-25 NOTE — Op Note (Signed)
Brief history: The patient is a 75 year old white female who was admitted with Covid and severe right leg pain. She failed medical management and was worked up with a lumbar MRI. This demonstrated a far lateral herniated disc at L3-4 and chronic L4-5 spondylolisthesis and spinal stenosis. I discussed the various treatment options with her. She has weighed the risk, benefits and alternatives and decided to proceed with an L3-4 discectomy.  Preoperative diagnosis: L3-4 far lateral herniated disc, lumbar radiculopathy, lumbago  Postoperative diagnosis: The same  Procedure: Right L3-4 far lateral intervertebral discectomy using micro-dissection  Surgeon: Dr. Earle Gell  Asst.: Dr. Pieter Partridge Dawley  Anesthesia: Gen. endotracheal  Estimated blood loss: 125 cc  Drains: None  Complications: None  Description of procedure: The patient was brought to the operating room by the anesthesia team. General endotracheal anesthesia was induced. The patient was turned to the prone position on the Wilson frame. The patient's lumbosacral region was then prepared with Betadine scrub and Betadine solution. Sterile drapes were applied.  I then injected the area to be incised with Marcaine with epinephrine solution. I then used a scalpel to make a linear midline incision over the L3-4 intervertebral disc space. I then used electrocautery to perform a right sided subperiosteal dissection exposing the spinous process and lamina of L3 and L4. We obtained intraoperative radiograph to confirm our location. I then inserted the [facility] retractor for exposure.  We then brought the operative microscope into the field. Under its magnification and illumination we completed the microdissection. I used a high-speed drill to drill off the lateral aspect of the right L3 pars. I then used a Kerrison punches to remove the intertransverse ligament we then used microdissection to dissect through the scar tissue and identified the  exiting L3 nerve root. I then used a Kerrison punch to perform a foraminotomy at about the L3 nerve root. We dissected around the nerve root. This exposed the intervertebral disc. We identified the ruptured disc and remove it in multiple large fragments with the pituitary forceps. I inspected the annulus. I did not see any large holes in the annulus and did not perform an intervertebral discectomy.  I then palpated along the ventral surface of the right L3 nerve root and noted that the neural structures were well decompressed. This completed the decompression.  We then obtained hemostasis using bipolar electrocautery. We irrigated the wound out with bacitracin solution. We then removed the retractor. We then reapproximated the patient's thoracolumbar fascia with interrupted #1 Vicryl suture. We then reapproximated the patient's subcutaneous tissue with interrupted 2-0 Vicryl suture. We then reapproximated patient's skin with Steri-Strips and benzoin. The was then coated with bacitracin ointment. The drapes were removed. The patient was subsequently returned to the supine position where they were extubated by the anesthesia team. The patient was then transported to the postanesthesia care unit in stable condition. All sponge instrument and needle counts were reportedly correct at the end of this case.

## 2020-05-25 NOTE — Progress Notes (Signed)
Hospital bed:  Length of Need Lifetime  The above medical condition requires: Patient requires the ability to reposition frequently  Head must be elevated greater than: 30 degrees  Bed type Semi-electric  Trapeze Bar Yes  Support Surface: Gel Overlay

## 2020-05-25 NOTE — Progress Notes (Signed)
Subjective: The patient is alert and pleasant.  She continues complain of severe right leg pain.  Objective: Vital signs in last 24 hours: Temp:  [97.9 F (36.6 C)-98.7 F (37.1 C)] 98.2 F (36.8 C) (02/17 1533) Pulse Rate:  [81-95] 81 (02/17 1533) Resp:  [18-20] 20 (02/17 1533) BP: (119-149)/(84-96) 128/88 (02/17 1533) SpO2:  [96 %-100 %] 98 % (02/17 1533) Weight:  [93.6 kg] 93.6 kg (02/17 1638) Estimated body mass index is 35.4 kg/m as calculated from the following:   Height as of this encounter: 5' 4.02" (1.626 m).   Weight as of this encounter: 93.6 kg.   Intake/Output from previous day: 02/16 0701 - 02/17 0700 In: 340 [P.O.:250; I.V.:90] Out: 450 [Urine:450] Intake/Output this shift: Total I/O In: -  Out: 400 [Urine:400]  Physical exam the patient is alert and pleasant.  She is moving all 4 extremities.  Lab Results: Recent Labs    05/25/20 0635  WBC 14.4*  HGB 14.3  HCT 42.2  PLT 294   BMET Recent Labs    05/25/20 0635  NA 140  K 3.7  CL 105  CO2 25  GLUCOSE 104*  BUN 13  CREATININE 1.22*  CALCIUM 9.1    Studies/Results: No results found.  Assessment/Plan: Right L3-4 herniated disc, right lumbar radiculopathy: I have discussed the situation with the patient.  I have answered all questions regarding a lumbar discectomy.  She wants to proceed with a right L3-4 discectomy.  LOS: 12 days     Ophelia Charter 05/25/2020, 4:48 PM

## 2020-05-25 NOTE — Anesthesia Procedure Notes (Signed)
Procedure Name: Intubation Date/Time: 05/25/2020 5:48 PM Performed by: Barrington Ellison, CRNA Pre-anesthesia Checklist: Patient identified, Emergency Drugs available, Suction available and Patient being monitored Patient Re-evaluated:Patient Re-evaluated prior to induction Oxygen Delivery Method: Circle System Utilized Preoxygenation: Pre-oxygenation with 100% oxygen Induction Type: IV induction Ventilation: Mask ventilation without difficulty Laryngoscope Size: Glidescope and 3 Tube type: Oral Tube size: 7.0 mm Number of attempts: 2 Airway Equipment and Method: Stylet and Oral airway Placement Confirmation: ETT inserted through vocal cords under direct vision,  positive ETCO2 and breath sounds checked- equal and bilateral Tube secured with: Tape Dental Injury: Teeth and Oropharynx as per pre-operative assessment  Difficulty Due To: Difficulty was unanticipated Comments: DL x 1 with Mac 3 Grade 4 view, glidescope DL X 1 Grade 1 view

## 2020-05-25 NOTE — Plan of Care (Signed)

## 2020-05-25 NOTE — Anesthesia Postprocedure Evaluation (Signed)
Anesthesia Post Note  Patient: Kayla [name]  Procedure(s) Performed: MICRODISCECTOMY Right Lumbar three - four (Right Back)     Patient location during evaluation: PACU Anesthesia Type: General Level of consciousness: awake and alert Pain management: pain level controlled Vital Signs Assessment: post-procedure vital signs reviewed and stable Respiratory status: spontaneous breathing, nonlabored ventilation and respiratory function stable Cardiovascular status: blood pressure returned to baseline and stable Postop Assessment: no apparent nausea or vomiting Anesthetic complications: no   No complications documented.  Last Vitals:  Vitals:   05/25/20 2025 05/25/20 2028  BP:  105/62  Pulse: 97 97  Resp: (!) 22 18  Temp:  36.5 C  SpO2: 100% 98%    Last Pain:  Vitals:   05/25/20 2028  TempSrc:   PainSc: [place]

## 2020-05-25 NOTE — Anesthesia Preprocedure Evaluation (Addendum)
Anesthesia Evaluation  Patient identified by MRN, date of birth, ID band Patient awake    Reviewed: Allergy & Precautions, H&P , NPO status , Patient's Chart, lab work & pertinent test results  Airway Mallampati: II  TM Distance: >3 FB Neck ROM: Full    Dental no notable dental hx.    Pulmonary asthma ,    Pulmonary exam normal breath sounds clear to auscultation       Cardiovascular hypertension, Pt. on medications Normal cardiovascular exam Rhythm:Regular Rate:Normal     Neuro/Psych negative neurological ROS  negative psych ROS   GI/Hepatic Neg liver ROS, GERD  ,  Endo/Other  Hypothyroidism obesity  Renal/GU negative Renal ROS  negative genitourinary   Musculoskeletal negative musculoskeletal ROS (+)   Abdominal   Peds negative pediatric ROS (+)  Hematology negative hematology ROS (+)   Anesthesia Other Findings   Reproductive/Obstetrics negative OB ROS                            Anesthesia Physical Anesthesia Plan  ASA: III  Anesthesia Plan: General   Post-op Pain Management:    Induction: Intravenous  PONV Risk Score and Plan: 3 and Ondansetron, Dexamethasone and Treatment may vary due to age or medical condition  Airway Management Planned: Oral ETT  Additional Equipment:   Intra-op Plan:   Post-operative Plan: Extubation in OR  Informed Consent: I have reviewed the patients History and Physical, chart, labs and discussed the procedure including the risks, benefits and alternatives for the proposed anesthesia with the patient or authorized representative who has indicated his/her understanding and acceptance.     Dental advisory given  Plan Discussed with: CRNA and Surgeon  Anesthesia Plan Comments:        Anesthesia Quick Evaluation
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
## 2020-05-26 ENCOUNTER — Encounter (HOSPITAL_COMMUNITY): Payer: Self-pay | Admitting: Neurosurgery

## 2020-05-26 DIAGNOSIS — M5431 Sciatica, right side: Secondary | ICD-10-CM | POA: Diagnosis not present

## 2020-05-26 DIAGNOSIS — N179 Acute kidney failure, unspecified: Secondary | ICD-10-CM | POA: Diagnosis not present

## 2020-05-26 LAB — CBC WITH DIFFERENTIAL/PLATELET
Abs Immature Granulocytes: 0.19 10*3/uL — ABNORMAL HIGH (ref 0.00–0.07)
Abs Immature Granulocytes: 0.15 10*3/uL — ABNORMAL HIGH (ref 0.00–0.07)
Basophils Absolute: 0 10*3/uL (ref 0.0–0.1)
Basophils Absolute: 0 10*3/uL (ref 0.0–0.1)
Basophils Relative: 0 %
Basophils Relative: 0 %
Eosinophils Absolute: 0 10*3/uL (ref 0.0–0.5)
Eosinophils Absolute: 0.1 10*3/uL (ref 0.0–0.5)
Eosinophils Relative: 0 %
Eosinophils Relative: 1 %
HCT: 43.4 % (ref 36.0–46.0)
HCT: 38.6 % (ref 36.0–46.0)
Hemoglobin: 13.9 g/dL (ref 12.0–15.0)
Hemoglobin: 12.9 g/dL (ref 12.0–15.0)
Immature Granulocytes: 1 %
Immature Granulocytes: 1 %
Lymphocytes Relative: 2 %
Lymphocytes Relative: 13 %
Lymphs Abs: 0.7 10*3/uL (ref 0.7–4.0)
Lymphs Abs: 2.3 10*3/uL (ref 0.7–4.0)
MCH: 32.2 pg (ref 26.0–34.0)
MCH: 33.2 pg (ref 26.0–34.0)
MCHC: 32 g/dL (ref 30.0–36.0)
MCHC: 33.4 g/dL (ref 30.0–36.0)
MCV: 100.5 fL — ABNORMAL HIGH (ref 80.0–100.0)
MCV: 99.2 fL (ref 80.0–100.0)
Monocytes Absolute: 1.6 10*3/uL — ABNORMAL HIGH (ref 0.1–1.0)
Monocytes Absolute: 1.2 10*3/uL — ABNORMAL HIGH (ref 0.1–1.0)
Monocytes Relative: 5 %
Monocytes Relative: 7 %
Neutro Abs: 27 10*3/uL — ABNORMAL HIGH (ref 1.7–7.7)
Neutro Abs: 13.5 10*3/uL — ABNORMAL HIGH (ref 1.7–7.7)
Neutrophils Relative %: 92 %
Neutrophils Relative %: 78 %
Platelets: 268 10*3/uL (ref 150–400)
Platelets: 245 10*3/uL (ref 150–400)
RBC: 4.32 MIL/uL (ref 3.87–5.11)
RBC: 3.89 MIL/uL (ref 3.87–5.11)
RDW: 13.2 % (ref 11.5–15.5)
RDW: 13.6 % (ref 11.5–15.5)
WBC: 29.4 10*3/uL — ABNORMAL HIGH (ref 4.0–10.5)
WBC: 17.2 10*3/uL — ABNORMAL HIGH (ref 4.0–10.5)
nRBC: 0 % (ref 0.0–0.2)
nRBC: 0 % (ref 0.0–0.2)

## 2020-05-26 LAB — BASIC METABOLIC PANEL
Anion gap: 10 (ref 5–15)
BUN: 14 mg/dL (ref 8–23)
CO2: 23 mmol/L (ref 22–32)
Calcium: 8.9 mg/dL (ref 8.9–10.3)
Chloride: 104 mmol/L (ref 98–111)
Creatinine, Ser: 1.28 mg/dL — ABNORMAL HIGH (ref 0.44–1.00)
GFR, Estimated: 44 mL/min — ABNORMAL LOW (ref >60)
Glucose, Bld: 111 mg/dL — ABNORMAL HIGH (ref 70–99)
Potassium: 4.3 mmol/L (ref 3.5–5.1)
Sodium: 137 mmol/L (ref 135–145)

## 2020-05-26 LAB — MAGNESIUM: Magnesium: 2.5 mg/dL — ABNORMAL HIGH (ref 1.7–2.4)

## 2020-05-26 MED ORDER — POLYETHYLENE GLYCOL 3350 17 G PO PACK
17.0000 g | PACK | Freq: Two times a day (BID) | ORAL | Status: DC
  Administered 2020-05-27: 17 g via ORAL
  Filled 2020-05-26 (×5): qty 1

## 2020-05-26 MED ORDER — DEXAMETHASONE SODIUM PHOSPHATE 4 MG/ML IJ SOLN
4.0000 mg | Freq: Four times a day (QID) | INTRAMUSCULAR | Status: AC
  Administered 2020-05-26 – 2020-05-28 (×8): 4 mg via INTRAVENOUS
  Filled 2020-05-26 (×8): qty 1
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
## 2020-05-26 NOTE — Progress Notes (Signed)
   Providing Compassionate, Quality Care - Together   Subjective: Patient reports significant pain overnight. She describes pain primarily in her low back, around her incision. Nurse reports the patient has been getting scheduled hydrocodone instead of the prn oxycodone. Patient is also requesting a diet.  Objective: Vital signs in last 24 hours: Temp:  [97.5 F (36.4 C)-98.2 F (36.8 C)] 97.5 F (36.4 C) (02/18 0753) Pulse Rate:  [81-123] 98 (02/18 0753) Resp:  [17-22] 18 (02/18 0753) BP: (105-140)/(62-88) 131/72 (02/18 0753) SpO2:  [93 %-100 %] 98 % (02/18 0753) Weight:  [93.6 kg] 93.6 kg (02/17 1638)  Intake/Output from previous day: 02/17 0701 - 02/18 0700 In: 1263.1 [P.O.:240; I.V.:923.1; IV Piggyback:100] Out: 1050 [Urine:850; Blood:200] Intake/Output this shift: No intake/output data recorded.  Alert and oriented x 4 PERRLA CN II-XII grossly intact MAE, Strength and sensation appear grossly intact Incision is covered with Honeycomb dressing and Steri Strips; Dressing is clean, dry, and intact   Lab Results: Recent Labs    05/25/20 0635 05/26/20 0304  WBC 14.4* 29.4*  HGB 14.3 13.9  HCT 42.2 43.4  PLT 294 268   BMET Recent Labs    05/25/20 0635 05/26/20 0304  NA 140 137  K 3.7 4.3  CL 105 104  CO2 25 23  GLUCOSE 104* 111*  BUN 13 14  CREATININE 1.22* 1.28*  CALCIUM 9.1 8.9    Studies/Results: DG Lumbar Spine 2-3 Views  Result Date: 05/25/2020 CLINICAL DATA:  Micro discectomy EXAM: LUMBAR SPINE - 2-3 VIEW COMPARISON:  MRI 05/16/2020 FINDINGS: Limited lateral views of the lumbar spine obtained intraoperatively. Image time stamp 18 17 demonstrates linear localizing device overlying posterior elements at L3. Image with time stamp 2951 demonstrates surgical instruments overlying spinous process at L4. Third image with time stamp 18 40 demonstrates surgical instruments overlying the spinous processes of L3 and L4. IMPRESSION: Intraoperative fluoroscopic  assistance provided during lumbar spine surgery. Electronically Signed   By: Donavan Foil M.D.   On: 05/25/2020 20:02    Assessment/Plan: The patient is one day status post right L3-4 far lateral discectomy by Dr. Arnoldo Morale. She is slowly improving.   LOS: 13 days    -Added short course of IV decadron. No Toradol ordered given patients renal function. -Ordered diet -Discontinued hydrocodone/acetaminophen -Hopefully, discharge home tomorrow   Viona Gilmore, [place], AGNP-C Nurse Practitioner  [facility] [place]. [address], [address], [place], [place] [address] P: [phone]    F: [phone]  05/26/2020, 11:03 AM

## 2020-05-26 NOTE — Progress Notes (Signed)
Physical Therapy Re-evaluation Patient Details Name: Kayla [name] MRN: [id number] DOB: October 12, 1945 Today's Date: 05/26/2020    History of Present Illness 75 y.o. female with history of hypertension, hypothyroidism nonobstructive CAD presents to the ER with complaints of worsening low back pain radiating to right lower extremity over the last couple of days.  Patient was recently in the ER with symptoms of upper respiratory tract on was diagnosed on February 2 with Covid infection. Lumbar MRI moderate spinal canal stenosis at L3-5. Pt underwent R L3-4 lateral intervertebral discectomy on 05/25/2020.    PT Comments    Pt remains limited by low back and RLE radiating pain after recent microdiscectomy. Pt is able to mobilize for short distances in the room but continues to require physical assistance and use of RW to do so safely. Pt will benefit from continued aggressive mobilization and acute therapies to improve activity tolerance and to improve independence in household mobility. PT recommends discharge home with assistance form family for all mobility and HHPT services. It appears as the pt had a Rollator delivered to room possibly. This PT would recommend a  3in1 commode as well. PT goals remain appropriate, POC extended.  Follow Up Recommendations  Home health PT     Equipment Recommendations  3in1 (PT)    Recommendations for Other Services       Precautions / Restrictions Precautions Precautions: Fall;Back Precaution Booklet Issued:  (PT verbally reviews back precautions) Precaution Comments: back pain Required Braces or Orthoses:  (no brace needs) Restrictions Weight Bearing Restrictions: No    Mobility  Bed Mobility Overal bed mobility: Needs Assistance Bed Mobility: Rolling;Sidelying to Sit Rolling: Min assist Sidelying to sit: Min guard       General bed mobility comments: pt requires minA for RLE management in rolling    Transfers Overall transfer level: Needs  assistance Equipment used: Rolling walker (2 wheeled) Transfers: Sit to/from [facility] Sit to Stand: Min assist Stand pivot transfers: Min assist       General transfer comment: pt requires minA to stabilize RW and to power up into standing  Ambulation/Gait Ambulation/Gait assistance: Min guard Gait Distance (Feet): 10 Feet Assistive device: Rolling walker (2 wheeled) Gait Pattern/deviations: Step-to pattern Gait velocity: reduced Gait velocity interpretation: <1.31 ft/sec, indicative of household ambulator General Gait Details: pt with short step-to gait, reduced foot clearance bilaterally and increased trunk flexion   Stairs             Wheelchair Mobility    Modified Rankin (Stroke Patients Only)       Balance Overall balance assessment: Needs assistance Sitting-balance support: No upper extremity supported;Feet supported Sitting balance-Leahy Scale: Good     Standing balance support: Bilateral upper extremity supported Standing balance-Leahy Scale: Poor Standing balance comment: reliant on UE support of RW                            Cognition Arousal/Alertness: Awake/alert Behavior During Therapy: WFL for tasks assessed/performed Overall Cognitive Status: Within Functional Limits for tasks assessed                                        Exercises      General Comments General comments (skin integrity, edema, etc.): VSS on RA      Pertinent Vitals/Pain Pain Assessment: 0-10 Pain Score: 10-Worst pain ever Pain  Location: low back and RLE Pain Descriptors / Indicators: Aching Pain Intervention(s): Monitored during session    Home Living                      Prior Function            PT Goals (current goals can now be found in the care plan section) Acute Rehab PT Goals Patient Stated Goal: decrease pain PT Goal Formulation: With patient Time For Goal Achievement: 06/09/20 Potential to  Achieve Goals: Good Additional Goals Additional Goal #1: Pt will mobilize with back and RLE pain <4/10 to improve activity tolerance. Progress towards PT goals: Not progressing toward goals - comment (back surgery, pt at similar level to prior to surgery)    Frequency    Min 5X/week      PT Plan Frequency needs to be updated    Co-evaluation              AM-PAC PT "6 Clicks" Mobility   Outcome Measure  Help needed turning from your back to your side while in a flat bed without using bedrails?: A Little Help needed moving from lying on your back to sitting on the side of a flat bed without using bedrails?: A Little Help needed moving to and from a bed to a chair (including a wheelchair)?: A Little Help needed standing up from a chair using your arms (e.g., wheelchair or bedside chair)?: A Little Help needed to walk in hospital room?: A Little Help needed climbing 3-5 steps with a railing? : A Lot 6 Click Score: 17    End of Session   Activity Tolerance: Patient tolerated treatment well Patient left: in chair;with call bell/phone within reach;with chair alarm set Nurse Communication: Mobility status PT Visit Diagnosis: Unsteadiness on feet (R26.81);Muscle weakness (generalized) (M62.81);Pain Pain - Right/Left: Right Pain - part of body: Leg     Time: 0811-0849 PT Time Calculation (min) (ACUTE ONLY): 38 min  Charges:  $Therapeutic Activity: 8-22 mins                     Zenaida Niece, PT, DPT Acute Rehabilitation Pager: [phone]    Zenaida Niece 05/26/2020, 9:37 AM

## 2020-05-26 NOTE — Evaluation (Signed)
Occupational Therapy Evaluation Patient Details Name: Kayla [name] MRN: [id number] DOB: 02/16/1946 Today's Date: 05/26/2020    History of Present Illness 75 y.o. female presenting with worsening low back pain radiating into RLE x several months. Patient admitted with L3-4 lateral herniated disc s/p discectomy by Dr. Arnoldo Morale on 2/17. PMHx significant for COVID-19, HTN, CAD, anxiety, HLD, and sporiatic arthritis.   Clinical Impression   PTA patient was living with family in a private residence and was independent with ADLs with intermittent use of RW during periods of increased pain. Patient notes increased difficulty with IADLs including cooking/cleaning requiring assistance from others. Patient was driving short distances to the store but reports family completed grocery shopping. Patient currently presents below baseline level of function demonstrating LB ADLs with Min to Mod A and ADL transfers and short-distance functional mobility with Min guard to Min A with use of rollator. OT provided education on spinal precautions, home set-up to maximize safety and independence with self-care tasks, and acquisition/use of AE. Patient expressed verbal understanding but would benefit from continued education. Patient limited by 10/10 pain in low back radiating into RLE. Patient unable to tolerate touching at RLE 2/2 hypersensitivity. Education on use of gait belt as leg lifter to assist with bed mobility. OT will continue to follow acutely.      Follow Up Recommendations  Home health OT;Supervision/Assistance - 24 hour    Equipment Recommendations  Other (comment) (TBD)    Recommendations for Other Services       Precautions / Restrictions Precautions Precautions: Fall;Back Precaution Booklet Issued: Yes (comment) Precaution Comments: HOH!! Reviewed writtent handout. Patient able to recall 3/3 back precautions with cueing. Required Braces or Orthoses:  (No brace required.) Restrictions Weight  Bearing Restrictions: No      Mobility Bed Mobility Overal bed mobility: Needs Assistance Bed Mobility: Sit to Supine Rolling: Min assist Sidelying to sit: Min guard   Sit to supine: Min assist   General bed mobility comments: Patient able to lower trunk to bed surface but unable to lift RLE from EOB to bed surface. Cannot tolerate touching at RLE 2/2 hypersensitivity. Education on use of gait belt as leg lifter.    Transfers Overall transfer level: Needs assistance Equipment used: 4-wheeled walker Transfers: Sit to/from [facility] Sit to Stand: Min guard;Min assist Stand pivot transfers: Min guard;Min assist       General transfer comment: Min to min guard for sit to stand transfers from various surfaces. Cues for safety with rollator and for hand placement.    Balance Overall balance assessment: Needs assistance Sitting-balance support: No upper extremity supported;Feet supported Sitting balance-Leahy Scale: Good     Standing balance support: Bilateral upper extremity supported Standing balance-Leahy Scale: Poor Standing balance comment: reliant on UE support of RW                           ADL either performed or assessed with clinical judgement   ADL Overall ADL's : Needs assistance/impaired     Grooming: Set up;Sitting               Lower Body Dressing: Moderate assistance Lower Body Dressing Details (indicate cue type and reason): Unable to attain/maintain figure-4 position to don/doff footwear. Education on AE. Toilet Transfer: Minimal Print production planner Details (indicate cue type and reason): Simulated with transfer to EOB. Cues for safety with rollator.         Functional mobility during ADLs:  Min guard General ADL Comments: Patient limited by severe pain in low back radiating into RLE. Cannot tolerate touching at RLE. Hypersensitivity.     Vision Baseline Vision/History: Wears glasses Wears Glasses: At all  times Patient Visual Report: No change from baseline       Perception     Praxis      Pertinent Vitals/Pain Pain Assessment: 0-10 Pain Score: 10-Worst pain ever Pain Location: low back and RLE Pain Descriptors / Indicators: Aching Pain Intervention(s): Limited activity within patient's tolerance;Monitored during session;Repositioned;Patient requesting pain meds-RN notified     Hand Dominance Right   Extremity/Trunk Assessment Upper Extremity Assessment Upper Extremity Assessment: Overall WFL for tasks assessed   Lower Extremity Assessment Lower Extremity Assessment: Defer to PT evaluation   Cervical / Trunk Assessment Cervical / Trunk Assessment: Other exceptions Cervical / Trunk Exceptions: s/p lumbar surgery   Communication Communication Communication: HOH (Very HOH. Hears best in L ear. Cannot hear anything in R ear.)   Cognition Arousal/Alertness: Awake/alert Behavior During Therapy: WFL for tasks assessed/performed Overall Cognitive Status: Within Functional Limits for tasks assessed                                     General Comments  VSS on RA    Exercises     Shoulder Instructions      Home Living Family/patient expects to be discharged to:: Private residence Living Arrangements: Children Available Help at Discharge: Family;Other (Comment) (Reports available most of the time) Type of Home: House Home Access: Level entry     Home Layout: One level     Bathroom Shower/Tub: Occupational psychologist: Standard     Home Equipment: Environmental consultant - 2 wheels;Other (comment);Adaptive equipment (Patient has an adjustable bed in storage. Reports insurance to provide shower chair and rollator.) Adaptive Equipment: Reacher Additional Comments: Some limitations in history due to pt very HOH.      Prior Functioning/Environment Level of Independence: Independent        Comments: Completes ADLs with I. Assist with cooking/cleaning tasks  from family. Patient reports intermittent use of RW with increase in pain PTA.        OT Problem List: Decreased strength;Decreased activity tolerance;Impaired balance (sitting and/or standing);Decreased knowledge of use of DME or AE;Pain      OT Treatment/Interventions: Self-care/ADL training;Therapeutic exercise;Energy conservation;DME and/or AE instruction;Therapeutic activities;Patient/family education;Balance training    OT Goals(Current goals can be found in the care plan section) Acute Rehab OT Goals Patient Stated Goal: Decrease pain OT Goal Formulation: With patient Time For Goal Achievement: 06/09/20 Potential to Achieve Goals: Good ADL Goals Pt Will Perform Grooming: with supervision;standing Pt Will Perform Lower Body Dressing: with supervision;sit to/from stand Pt Will Transfer to Toilet: with supervision;ambulating Pt Will Perform Toileting - Clothing Manipulation and hygiene: with supervision;sit to/from stand Additional ADL Goal #1: Patient will recall 3/3 back precautions without cueing with good return demo during ADLs.  OT Frequency: Min 3X/week   Barriers to D/C:            Co-evaluation              AM-PAC OT "6 Clicks" Daily Activity     Outcome Measure Help from another person eating meals?: None Help from another person taking care of personal grooming?: A Little Help from another person toileting, which includes using toliet, bedpan, or urinal?: A Little Help from another person  bathing (including washing, rinsing, drying)?: A Lot Help from another person to put on and taking off regular upper body clothing?: A Little Help from another person to put on and taking off regular lower body clothing?: A Lot 6 Click Score: 17   End of Session Equipment Utilized During Treatment: Other (comment) (Rollator)  Activity Tolerance: Patient tolerated treatment well Patient left: in bed;with call bell/phone within reach;with bed alarm set  OT Visit  Diagnosis: Unsteadiness on feet (R26.81);Other abnormalities of gait and mobility (R26.89);Muscle weakness (generalized) (M62.81);Pain Pain - part of body:  (Back)                Time: 7169-6789 OT Time Calculation (min): 28 min Charges:  OT General Charges $OT Visit: 1 Visit OT Evaluation $OT Eval Moderate Complexity: 1 Mod OT Treatments $Therapeutic Activity: 8-22 mins  Destanae H. OTR/L Supplemental OT, Department of rehab services [phone]  Destanae R H. 05/26/2020, 10:12 AM

## 2020-05-27 DIAGNOSIS — M5431 Sciatica, right side: Secondary | ICD-10-CM | POA: Diagnosis not present

## 2020-05-27 DIAGNOSIS — N179 Acute kidney failure, unspecified: Secondary | ICD-10-CM | POA: Diagnosis not present

## 2020-05-27 LAB — BASIC METABOLIC PANEL
Anion gap: 10 (ref 5–15)
BUN: 15 mg/dL (ref 8–23)
CO2: 24 mmol/L (ref 22–32)
Calcium: 9.2 mg/dL (ref 8.9–10.3)
Chloride: 102 mmol/L (ref 98–111)
Creatinine, Ser: 1.1 mg/dL — ABNORMAL HIGH (ref 0.44–1.00)
GFR, Estimated: 52 mL/min — ABNORMAL LOW (ref >60)
Glucose, Bld: 146 mg/dL — ABNORMAL HIGH (ref 70–99)
Potassium: 4.8 mmol/L (ref 3.5–5.1)
Sodium: 136 mmol/L (ref 135–145)

## 2020-05-27 LAB — URINE CULTURE: Culture: NO GROWTH

## 2020-05-27 LAB — LACTIC ACID, PLASMA: Lactic Acid, Venous: 0.8 mmol/L (ref 0.5–1.9)
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
## 2020-05-27 NOTE — Progress Notes (Signed)
Subjective: The patient is alert and pleasant.  She looks and feels better.  Now she wants to go home "on Tuesday".  Objective: Vital signs in last 24 hours: Temp:  [97.5 F (36.4 C)-98.3 F (36.8 C)] 97.7 F (36.5 C) (02/19 0802) Pulse Rate:  [77-100] 93 (02/19 0802) Resp:  [17-20] 18 (02/19 0802) BP: (104-137)/(65-82) 125/79 (02/19 0802) SpO2:  [96 %-100 %] 100 % (02/19 0802) Estimated body mass index is 35.4 kg/m as calculated from the following:   Height as of this encounter: 5' 4.02" (1.626 m).   Weight as of this encounter: 93.6 kg.   Intake/Output from previous day: 02/18 0701 - 02/19 0700 In: 240 [P.O.:240] Out: 975 [Urine:975] Intake/Output this shift: No intake/output data recorded.  Physical exam patient is alert and pleasant.  She is moving her lower extremities well.  Lab Results: Recent Labs    05/26/20 0304 05/26/20 1518  WBC 29.4* 17.2*  HGB 13.9 12.9  HCT 43.4 38.6  PLT 268 245   BMET Recent Labs    05/26/20 0304 05/27/20 0458  NA 137 136  K 4.3 4.8  CL 104 102  CO2 23 24  GLUCOSE 111* 146*  BUN 14 15  CREATININE 1.28* 1.10*  CALCIUM 8.9 9.2    Studies/Results: DG Lumbar Spine 2-3 Views  Result Date: 05/25/2020 CLINICAL DATA:  Micro discectomy EXAM: LUMBAR SPINE - 2-3 VIEW COMPARISON:  MRI 05/16/2020 FINDINGS: Limited lateral views of the lumbar spine obtained intraoperatively. Image time stamp 18 17 demonstrates linear localizing device overlying posterior elements at L3. Image with time stamp 4239 demonstrates surgical instruments overlying spinous process at L4. Third image with time stamp 18 40 demonstrates surgical instruments overlying the spinous processes of L3 and L4. IMPRESSION: Intraoperative fluoroscopic assistance provided during lumbar spine surgery. Electronically Signed   By: Donavan Foil M.D.   On: 05/25/2020 20:02    Assessment/Plan: Postop day #2: The patient is improving.  We discussed skilled nursing facility/rehab  placement but she refused.  She can be discharged from my point of view.  Please have her follow-up with me in the office in about 3 weeks.  LOS: 14 days     Ophelia Charter 05/27/2020, 10:57 AM

## 2020-05-27 NOTE — Progress Notes (Signed)
Pt refused Miralax and Colace at bedtime stating she hasn't "had an appetite" and hasn't been eating very much.

## 2020-05-27 NOTE — Progress Notes (Signed)
Physical Therapy Treatment Patient Details Name: Kayla [name] MRN: [id number] DOB: 02-12-1946 Today's Date: 05/27/2020    History of Present Illness 75 y.o. female with history of hypertension, hypothyroidism nonobstructive CAD presents to the ER with complaints of worsening low back pain radiating to right lower extremity over the last couple of days.  Patient was recently in the ER with symptoms of upper respiratory tract on was diagnosed on February 2 with Covid infection. Lumbar MRI moderate spinal canal stenosis at L3-5. Pt underwent R L3-4 lateral intervertebral discectomy on 05/25/2020.    PT Comments    Pt making significant progress with mobility this date, ambulating up to ~250 ft in one bout with a rollator and min guard assist for safety but no LOB. She does ambulate at a very slow pace and with an antalgic gait pattern due to R leg pain. However, pt reports R leg pain has improved as the blanket no longer hurts to touch it as much. Educated pt on necessity to change positions (up for each meal) often to decrease stiffness and pain and to improve mobility further, pt in agreement to plan. Pt motivated to get home to grandchildren. Able to recall 3/3 spinal precautions but has difficulty utilizing R leg to assist with roll to L still. Will continue to follow acutely. Current recommendations remain appropriate.     Follow Up Recommendations  Home health PT     Equipment Recommendations  3in1 (PT)    Recommendations for Other Services       Precautions / Restrictions Precautions Precautions: Fall;Back Precaution Booklet Issued:  (PT verbally reviews back precautions, pt recalls 3/3) Precaution Comments: back pain; HOH Required Braces or Orthoses:  (no brace needs) Restrictions Weight Bearing Restrictions: No RLE Weight Bearing: Weight bearing as tolerated    Mobility  Bed Mobility Overal bed mobility: Needs Assistance Bed Mobility: Rolling;Sidelying to Sit Rolling: Min  assist Sidelying to sit: Min guard       General bed mobility comments: pt requires minA for RLE management in rolling to L. Min guard with cues to bring legs off EOB while trunk ascends, with success.    Transfers Overall transfer level: Needs assistance Equipment used: 4-wheeled walker Transfers: Sit to/from Stand Sit to Stand: Min guard         General transfer comment: Extra time and cues for hand placement, min guard for safety. No LOB. 1x from EOB and 1x from rollator.  Ambulation/Gait Ambulation/Gait assistance: Min guard Gait Distance (Feet): 250 Feet Assistive device: 4-wheeled walker Gait Pattern/deviations: Step-through pattern;Antalgic;Decreased step length - right;Decreased step length - left;Decreased stride length;Decreased stance time - right Gait velocity: reduced Gait velocity interpretation: <1.31 ft/sec, indicative of household ambulator General Gait Details: Pt with slow gait and short bil steps, poor feet clearance, and decreased stance time on R due to pain, demonstrating antalgic gait pattern. When pt fatigued rollator would get a little more distal and needed cues to get proximal for safety. Cued pt to increase bil step length and height, min success.   Stairs             Wheelchair Mobility    Modified Rankin (Stroke Patients Only)       Balance Overall balance assessment: Needs assistance Sitting-balance support: No upper extremity supported;Feet supported Sitting balance-Leahy Scale: Good Sitting balance - Comments: Static sitting EOB no LOB, supervision for safety.   Standing balance support: Bilateral upper extremity supported;Single extremity supported Standing balance-Leahy Scale: Poor Standing balance comment: reliant on  1-2 UE support of rollator                            Cognition Arousal/Alertness: Awake/alert Behavior During Therapy: WFL for tasks assessed/performed Overall Cognitive Status: Within Functional  Limits for tasks assessed                                        Exercises      General Comments        Pertinent Vitals/Pain Pain Assessment: Faces Faces Pain Scale: Hurts little more Pain Location: low back and RLE Pain Descriptors / Indicators: Aching Pain Intervention(s): Limited activity within patient's tolerance;Monitored during session;Repositioned;Premedicated before session (RN provided pain meds at start of session)    Home Living                      Prior Function            PT Goals (current goals can now be found in the care plan section) Acute Rehab PT Goals Patient Stated Goal: get home to grandbabies PT Goal Formulation: With patient Time For Goal Achievement: 06/09/20 Potential to Achieve Goals: Good Progress towards PT goals: Progressing toward goals    Frequency    Min 5X/week      PT Plan Current plan remains appropriate    Co-evaluation              AM-PAC PT "6 Clicks" Mobility   Outcome Measure  Help needed turning from your back to your side while in a flat bed without using bedrails?: A Little Help needed moving from lying on your back to sitting on the side of a flat bed without using bedrails?: A Little Help needed moving to and from a bed to a chair (including a wheelchair)?: A Little Help needed standing up from a chair using your arms (e.g., wheelchair or bedside chair)?: A Little Help needed to walk in hospital room?: A Little Help needed climbing 3-5 steps with a railing? : A Lot 6 Click Score: 17    End of Session Equipment Utilized During Treatment: Gait belt Activity Tolerance: Patient tolerated treatment well Patient left: in chair;with call bell/phone within reach;with chair alarm set Nurse Communication: Mobility status PT Visit Diagnosis: Unsteadiness on feet (R26.81);Muscle weakness (generalized) (M62.81);Pain;Other abnormalities of gait and mobility (R26.89);Difficulty in walking, not  elsewhere classified (R26.2) Pain - Right/Left: Right Pain - part of body: Leg     Time: 1720-1800 PT Time Calculation (min) (ACUTE ONLY): 40 min  Charges:  $Gait Training: 23-37 mins $Therapeutic Activity: 8-22 mins                     Moishe Spice, PT, DPT Acute Rehabilitation Services  Pager: [phone] Office: [place] 05/27/2020, 6:16 PM

## 2020-05-28 DIAGNOSIS — M5431 Sciatica, right side: Secondary | ICD-10-CM | POA: Diagnosis not present

## 2020-05-28 DIAGNOSIS — N179 Acute kidney failure, unspecified: Secondary | ICD-10-CM | POA: Diagnosis not present

## 2020-05-28 LAB — CBC
HCT: 38.5 % (ref 36.0–46.0)
Hemoglobin: 12.9 g/dL (ref 12.0–15.0)
MCH: 33.1 pg (ref 26.0–34.0)
MCHC: 33.5 g/dL (ref 30.0–36.0)
MCV: 98.7 fL (ref 80.0–100.0)
Platelets: 267 10*3/uL (ref 150–400)
RBC: 3.9 MIL/uL (ref 3.87–5.11)
RDW: 13.3 % (ref 11.5–15.5)
WBC: 15.4 10*3/uL — ABNORMAL HIGH (ref 4.0–10.5)
nRBC: 0 % (ref 0.0–0.2)

## 2020-05-28 LAB — BASIC METABOLIC PANEL
Anion gap: 10 (ref 5–15)
BUN: 16 mg/dL (ref 8–23)
CO2: 25 mmol/L (ref 22–32)
Calcium: 9.4 mg/dL (ref 8.9–10.3)
Chloride: 102 mmol/L (ref 98–111)
Creatinine, Ser: 1.02 mg/dL — ABNORMAL HIGH (ref 0.44–1.00)
GFR, Estimated: 57 mL/min — ABNORMAL LOW (ref >60)
Glucose, Bld: 153 mg/dL — ABNORMAL HIGH (ref 70–99)
Potassium: 3.8 mmol/L (ref 3.5–5.1)
Sodium: 137 mmol/L (ref 135–145)

## 2020-05-28 MED ORDER — GABAPENTIN 100 MG PO CAPS
100.0000 mg | ORAL_CAPSULE | Freq: Every day | ORAL | Status: DC
  Administered 2020-05-28 – 2020-05-29 (×2): 100 mg via ORAL
  Filled 2020-05-28 (×2): qty 1

## 2020-05-28 NOTE — Progress Notes (Signed)
Physical Therapy Treatment Patient Details Name: Kayla [name] MRN: [id number] DOB: 07-19-1945 Today's Date: 05/28/2020    History of Present Illness 75 y.o. female with history of hypertension, hypothyroidism nonobstructive CAD presents to the ER with complaints of worsening low back pain radiating to right lower extremity over the last couple of days.  Patient was recently in the ER with symptoms of upper respiratory tract on was diagnosed on February 2 with Covid infection. Lumbar MRI moderate spinal canal stenosis at L3-5. Pt underwent R L3-4 lateral intervertebral discectomy on 05/25/2020.    PT Comments    Pt continues to make significant progress with mobility, ambulating with increased symmetrical step length and feet clearance this date and at a slightly increased speed as pt became more warmed-up with distance progression. She ambulated ~375 ft with a rollator and supervision this date safely without LOB, needing cues to increase speed,bil step length, bil feet clearance, posture, and heel-to-toe sequence with mod success. She still ambulates at a very slow pace though and needs extra time and effort with cues to maintain spinal precautions when performing all bed mobility. She has particular difficulty with rolling to L and remembering to push up from current sitting surface for transfers. Will continue to follow acutely. Current recommendations remain appropriate.    Follow Up Recommendations  Home health PT     Equipment Recommendations  3in1 (PT)    Recommendations for Other Services       Precautions / Restrictions Precautions Precautions: Fall;Back Precaution Booklet Issued:  (PT verbally reviews back precautions, pt recalls 3/3) Precaution Comments: back pain; HOH Required Braces or Orthoses:  (no brace needs) Restrictions Weight Bearing Restrictions: No RLE Weight Bearing: Weight bearing as tolerated    Mobility  Bed Mobility Overal bed mobility: Needs Assistance Bed  Mobility: Rolling;Sidelying to Sit Rolling: Min guard Sidelying to sit: Min guard       General bed mobility comments: Cues to bend R leg to push on bed to assist with roll while R hand reaches for side of bed to pull, bed flat with min guard and extra time to complete. Min guard with cues to bring legs off EOB while trunk ascends, with success.    Transfers Overall transfer level: Needs assistance Equipment used: 4-wheeled walker Transfers: Sit to/from Stand Sit to Stand: Min guard         General transfer comment: Extra time and cues for hand placement, min guard for safety. No LOB. 1x from EOB and 1x from commode. Fair carryover with at least one hand on current sitting surface to push up to stand for safety.  Ambulation/Gait Ambulation/Gait assistance: Supervision Gait Distance (Feet): 375 Feet (x2 bouts of ~15 ft > ~375 ft) Assistive device: 4-wheeled walker Gait Pattern/deviations: Step-through pattern;Decreased step length - right;Decreased step length - left;Decreased stride length Gait velocity: reduced Gait velocity interpretation: <1.31 ft/sec, indicative of household ambulator General Gait Details: Pt with improved symmetrical steps this date, cuing to inc bil step length and feet clearance for proper heel-to-toe sequence, mod success. Cues for upright posture and increased speed, mod success as distance progressed. No LOB, supervision for safety.   Stairs             Wheelchair Mobility    Modified Rankin (Stroke Patients Only)       Balance Overall balance assessment: Needs assistance Sitting-balance support: No upper extremity supported;Feet supported Sitting balance-Leahy Scale: Good Sitting balance - Comments: Static sitting EOB no LOB, supervision for safety.  Standing balance support: Bilateral upper extremity supported;Single extremity supported Standing balance-Leahy Scale: Poor Standing balance comment: reliant on 1-2 UE support of  rollator                            Cognition Arousal/Alertness: Awake/alert Behavior During Therapy: WFL for tasks assessed/performed Overall Cognitive Status: Within Functional Limits for tasks assessed                                        Exercises      General Comments        Pertinent Vitals/Pain Pain Assessment: Faces Faces Pain Scale: Hurts a little bit Pain Location: low back and RLE Pain Descriptors / Indicators: Aching Pain Intervention(s): Limited activity within patient's tolerance;Monitored during session;Repositioned;Patient requesting pain meds-RN notified    Home Living                      Prior Function            PT Goals (current goals can now be found in the care plan section) Acute Rehab PT Goals Patient Stated Goal: get home to grandbabies PT Goal Formulation: With patient Time For Goal Achievement: 06/09/20 Potential to Achieve Goals: Good Progress towards PT goals: Progressing toward goals    Frequency    Min 5X/week      PT Plan Current plan remains appropriate    Co-evaluation              AM-PAC PT "6 Clicks" Mobility   Outcome Measure  Help needed turning from your back to your side while in a flat bed without using bedrails?: A Little Help needed moving from lying on your back to sitting on the side of a flat bed without using bedrails?: A Little Help needed moving to and from a bed to a chair (including a wheelchair)?: A Little Help needed standing up from a chair using your arms (e.g., wheelchair or bedside chair)?: A Little Help needed to walk in hospital room?: A Little Help needed climbing 3-5 steps with a railing? : A Lot 6 Click Score: 17    End of Session Equipment Utilized During Treatment: Gait belt Activity Tolerance: Patient tolerated treatment well Patient left: in chair;with call bell/phone within reach;with chair alarm set Nurse Communication: Mobility status PT  Visit Diagnosis: Unsteadiness on feet (R26.81);Muscle weakness (generalized) (M62.81);Pain;Other abnormalities of gait and mobility (R26.89);Difficulty in walking, not elsewhere classified (R26.2) Pain - Right/Left: Right Pain - part of body: Leg     Time: 3557-3220 PT Time Calculation (min) (ACUTE ONLY): 32 min  Charges:  $Gait Training: 23-37 mins                     Moishe Spice, PT, DPT Acute Rehabilitation Services  Pager: [phone] Office: [place] 05/28/2020, 8:47 AM

## 2020-05-28 NOTE — Plan of Care (Signed)

## 2020-05-28 NOTE — Discharge Instructions (Signed)
Wound Care Keep incision covered and dry for two days.    Do not put any creams, lotions, or ointments on incision. Leave steri-strips on back.  They will fall off by themselves.  Activity Walk each and every day, increasing distance each day. No lifting greater than 5 lbs. No driving for 2 weeks; may ride as a passenger locally.  Diet Resume your normal diet.   Return to Work Will be discussed at your follow up appointment.  Call Your Doctor If Any of These Occur Redness, drainage, or swelling at the wound.  Temperature greater than 101 degrees. Severe pain not relieved by pain medication. Incision starts to come apart.  Follow Up Appt Call today for appointment in 3 weeks ([phone]) or for problems. 

## 2020-05-28 NOTE — Progress Notes (Signed)
Occupational Therapy Treatment Patient Details Name: Kayla [name] MRN: [id number] DOB: [birth year] Today's Date: 05/28/2020    History of present illness 75 y.o. female with history of hypertension, hypothyroidism nonobstructive CAD presents to the ER with complaints of worsening low back pain radiating to right lower extremity over the last couple of days.  Patient was recently in the ER with symptoms of upper respiratory tract on was diagnosed on February 2 with Covid infection. Lumbar MRI moderate spinal canal stenosis at L3-5. Pt underwent R L3-4 lateral intervertebral discectomy on 05/25/2020.   OT comments  Pt. Seen for skilled OT treatment session.  Continued education on A/E. Pt. Interested in [facility] sponge and reacher.  Reports family will assist with LB dressing.  Reviewed how to complete pericare while maintaining back precautions.  Pt. With good return demo.    Follow Up Recommendations  Home health OT;Supervision/Assistance - 24 hour -pt. Wants to know if A/E is covered under her medicare plan or if she will have to purchase?   Equipment Recommendations  Other (comment)    Recommendations for Other Services      Precautions / Restrictions Precautions Precautions: Fall;Back Precaution Booklet Issued:  (PT verbally reviews back precautions, pt recalls 3/3) Precaution Comments: back pain; HOH Required Braces or Orthoses:  (no brace needs) Restrictions Weight Bearing Restrictions: No RLE Weight Bearing: Weight bearing as tolerated       Mobility Bed Mobility Overal bed mobility: Needs Assistance Bed Mobility: Rolling;Sidelying to Sit Rolling: Min guard Sidelying to sit: Min guard       General bed mobility comments: seated in recliner  Transfers Overall transfer level: Needs assistance Equipment used: 4-wheeled walker Transfers: Sit to/from Stand Sit to Stand: Min guard         General transfer comment: declined mobility as she had just returned from PT     Balance Overall balance assessment: Needs assistance Sitting-balance support: No upper extremity supported;Feet supported Sitting balance-Leahy Scale: Good Sitting balance - Comments: Static sitting EOB no LOB, supervision for safety.   Standing balance support: Bilateral upper extremity supported;Single extremity supported Standing balance-Leahy Scale: Poor Standing balance comment: reliant on 1-2 UE support of rollator                           ADL either performed or assessed with clinical judgement   ADL Overall ADL's : Needs assistance/impaired             Lower Body Bathing: With adaptive equipment Lower Body Bathing Details (indicate cue type and reason): pt. expresses interest in [facility] sponge for use during bathing, already has a shower seat     Lower Body Dressing: With adaptive equipment Lower Body Dressing Details (indicate cue type and reason): educated use of A/E, pt. declines need for sockaide stating her g.dtr. will be assisting with LB adls   Toilet Transfer Details (indicate cue type and reason): pt. declined as she had just completed PT. Toileting- Clothing Manipulation and Hygiene: Sitting/lateral lean Toileting - Clothing Manipulation Details (indicate cue type and reason): simulated in recliner to ensure she was not breaking precautions, able to demonstrate safe lean L/R while maintaining precautions       General ADL Comments: cont. education on A/E, pt. interested in [place] sponge and reacher possibly. reviewed cost and purchase in giftshop.  reviewed tech. for pericare to maintain precautions     Vision       Perception  Praxis      Cognition Arousal/Alertness: Awake/alert Behavior During Therapy: WFL for tasks assessed/performed Overall Cognitive Status: Within Functional Limits for tasks assessed                                          Exercises     Shoulder Instructions       General Comments  pt. 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  Eager to return home and get back to the kids.      Pertinent Vitals/ Pain       Pain Assessment: No/denies pain Faces Pain Scale: Hurts a little bit Pain Location: low back and RLE Pain Descriptors / Indicators: Aching Pain Intervention(s): Limited activity within patient's tolerance;Monitored during session;Repositioned;Patient requesting pain meds-RN notified  Home Living                                          Prior Functioning/Environment              Frequency  Min 3X/week        Progress Toward Goals  OT Goals(current goals can now be found in the care plan section)  Progress towards OT goals: Progressing toward goals  Acute Rehab OT Goals Patient Stated Goal: get home to grandbabies  Plan      Co-evaluation                 AM-PAC OT "6 Clicks" Daily Activity     Outcome Measure   Help from another person eating meals?: None Help from another person taking care of personal grooming?: A Little Help from another person toileting, which includes using toliet, bedpan, or urinal?: A Little Help from another person bathing (including washing, rinsing, drying)?: A Lot Help from another person to put on and taking off regular upper body clothing?: A Little Help from another person to put on and taking off regular lower body clothing?: A Lot 6 Click Score: 17    End of Session Equipment Utilized During Treatment: Other (comment) (A/E)  OT Visit Diagnosis: Unsteadiness on feet (R26.81);Other abnormalities of gait and mobility (R26.89);Muscle weakness (generalized) (M62.81);Pain   Activity Tolerance Patient tolerated treatment well   Patient Left in chair;with call bell/phone within reach   Nurse Communication          Time: 0842-0900 OT Time Calculation (min): 18 min  Charges: OT General Charges $OT Visit: 1 Visit OT Treatments $Self Care/Home Management : 8-22  mins  Sonia Baller, COTA/L Acute Rehabilitation [phone]   Janice Coffin 05/28/2020, 11:34 AM

## 2020-05-28 NOTE — Progress Notes (Addendum)
   Providing Compassionate, Quality Care - Together   Subjective: Patient reports that she ambulated 350 ft with physical therapy. When asked about discharging home today, she's insistent that she wait until Monday afternoon or Tuesday morning because she doesn't want her grandchildren to be home before she gets settled.  Objective: Vital signs in last 24 hours: Temp:  [97.4 F (36.3 C)-98.4 F (36.9 C)] 97.9 F (36.6 C) (02/20 0845) Pulse Rate:  [86-113] 113 (02/20 0845) Resp:  [18-20] 18 (02/20 0845) BP: (117-130)/(76-91) 130/91 (02/20 0845) SpO2:  [99 %-100 %] 100 % (02/20 0845)  Intake/Output from previous day: 02/19 0701 - 02/20 0700 In: 240 [P.O.:240] Out: 475 [Urine:475] Intake/Output this shift: No intake/output data recorded.  Alert and oriented x 4 PERRLA CN II-XII grossly intact MAE, Strength and sensation appear grossly intact Incision is covered with Honeycomb dressing and Steri Strips; Dressing is clean, dry, and intact   Lab Results: Recent Labs    05/26/20 1518 05/28/20 0422  WBC 17.2* 15.4*  HGB 12.9 12.9  HCT 38.6 38.5  PLT 245 267   BMET Recent Labs    05/27/20 0458 05/28/20 0422  NA 136 137  K 4.8 3.8  CL 102 102  CO2 24 25  GLUCOSE 146* 153*  BUN 15 16  CREATININE 1.10* 1.02*  CALCIUM 9.2 9.4    Studies/Results: No results found.  Assessment/Plan: The patient is three days status post right L3-4 far lateral discectomy by Dr. Arnoldo Morale. She is doing well, but doesn't want to be discharged yet.   LOS: 15 days    -Patient is fine to discharge home from a Neurosurgical perspective -Follow up in the office with Dr. Arnoldo Morale in three weeks   Viona Gilmore, [place], AGNP-C Nurse Practitioner  [facility] [place]. [address], [address], [place], [place] [address] P: [phone]    F: [phone]  05/28/2020, 9:23 AM
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
## 2020-05-29 DIAGNOSIS — I10 Essential (primary) hypertension: Secondary | ICD-10-CM | POA: Diagnosis not present

## 2020-05-29 LAB — BASIC METABOLIC PANEL
Anion gap: 8 (ref 5–15)
BUN: 17 mg/dL (ref 8–23)
CO2: 26 mmol/L (ref 22–32)
Calcium: 9.3 mg/dL (ref 8.9–10.3)
Chloride: 105 mmol/L (ref 98–111)
Creatinine, Ser: 1.08 mg/dL — ABNORMAL HIGH (ref 0.44–1.00)
GFR, Estimated: 54 mL/min — ABNORMAL LOW (ref >60)
Glucose, Bld: 100 mg/dL — ABNORMAL HIGH (ref 70–99)
Potassium: 4.3 mmol/L (ref 3.5–5.1)
Sodium: 139 mmol/L (ref 135–145)

## 2020-05-29 LAB — CBC
HCT: 37.7 % (ref 36.0–46.0)
Hemoglobin: 12.5 g/dL (ref 12.0–15.0)
MCH: 32.9 pg (ref 26.0–34.0)
MCHC: 33.2 g/dL (ref 30.0–36.0)
MCV: 99.2 fL (ref 80.0–100.0)
Platelets: 255 10*3/uL (ref 150–400)
RBC: 3.8 MIL/uL — ABNORMAL LOW (ref 3.87–5.11)
RDW: 13.5 % (ref 11.5–15.5)
WBC: 13.1 10*3/uL — ABNORMAL HIGH (ref 4.0–10.5)
nRBC: 0 % (ref 0.0–0.2)

## 2020-05-29 MED ORDER — HEPARIN SODIUM (PORCINE) 5000 UNIT/ML IJ SOLN
5000.0000 [IU] | Freq: Three times a day (TID) | INTRAMUSCULAR | Status: DC
  Administered 2020-05-29 – 2020-05-30 (×4): 5000 [IU] via SUBCUTANEOUS
  Filled 2020-05-29 (×4): qty 1
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
## 2020-05-29 NOTE — Progress Notes (Signed)
Physical Therapy Treatment Patient Details Name: Kayla [name] MRN: [id number] DOB: 16-Sep-1945 Today's Date: 05/29/2020    History of Present Illness 75 y.o. female with history of hypertension, hypothyroidism nonobstructive CAD presents to the ER with complaints of worsening low back pain radiating to right lower extremity over the last couple of days.  Patient was recently in the ER with symptoms of upper respiratory tract on was diagnosed on February 2 with Covid infection. Lumbar MRI moderate spinal canal stenosis at L3-5. Pt underwent R L3-4 lateral intervertebral discectomy on 05/25/2020.    PT Comments    Pt making continual progress daily with functional mobility as she is capable of performing all transfers and household and short community distance mobility safely without LOB when using a rollator. Pt displayed improved upright posture and gait speed today, even though she still walks fairly slow. She is able to negotiate tight spaces and adapt to changing environments while walking also as PT would spontaneously walk and stop in front of pt to simulate a child running in front of her at home (she often has her grandchildren with her). Pt would respond by quickly and safely stopping and then redirecting the rollator. Pt benefits from having a sturdy surface to pull on for bed mobility and reports she will place a sturdy table for that purpose next to her bed at home. Will continue to follow acutely. Current recommendations remain appropriate.   Follow Up Recommendations  Home health PT     Equipment Recommendations  3in1 (PT)    Recommendations for Other Services       Precautions / Restrictions Precautions Precautions: Fall;Back Precaution Booklet Issued:  (PT verbally reviews back precautions, pt recalls 3/3) Precaution Comments: back pain; HOH Required Braces or Orthoses:  (no brace needs) Restrictions Weight Bearing Restrictions: No RLE Weight Bearing: Weight bearing as  tolerated    Mobility  Bed Mobility Overal bed mobility: Needs Assistance Bed Mobility: Rolling;Sidelying to Sit Rolling: Min guard Sidelying to sit: Min guard       General bed mobility comments: Cues to bend R leg to push on bed to assist with roll while R hand reaches across body to pull on PT's vertically placed forearm (simulate pt's reported available sturdy bedside table), bed flat with min guard and extra time to complete. Min guard with pt pulling on PT's statically placed forearm to sit up also. extra time and effort but safe.    Transfers Overall transfer level: Needs assistance Equipment used: 4-wheeled walker Transfers: Sit to/from Stand Sit to Stand: Supervision         General transfer comment: Extra time and cues for hand placement, supervision for safety. No LOB. 2x from EOB and 1x from commode.  Ambulation/Gait Ambulation/Gait assistance: Supervision Gait Distance (Feet): 400 Feet Assistive device: 4-wheeled walker Gait Pattern/deviations: Step-through pattern;Decreased step length - right;Decreased step length - left;Decreased stride length;Decreased dorsiflexion - right Gait velocity: reduced Gait velocity interpretation: <1.8 ft/sec, indicate of risk for recurrent falls General Gait Details: Pt with improved symmetrical steps this date, cuing to inc bil step length and feet clearance for proper heel-to-toe sequence, mod success. Cues for upright posture and increased speed. Increased speed today and pt able to stop rollator suddenly as needed safely to then redirect it to avoid obstacles spontaneously getting in pt's path (PT would step in front of pt to simulate children playing around her home). No LOB, supervision for safety.   Stairs  Wheelchair Mobility    Modified Rankin (Stroke Patients Only)       Balance Overall balance assessment: Needs assistance Sitting-balance support: No upper extremity supported;Feet supported Sitting  balance-Leahy Scale: Good Sitting balance - Comments: Static sitting EOB no LOB, supervision for safety.   Standing balance support: Bilateral upper extremity supported;Single extremity supported Standing balance-Leahy Scale: Poor Standing balance comment: reliant on 1-2 UE support of rollator                            Cognition Arousal/Alertness: Awake/alert Behavior During Therapy: WFL for tasks assessed/performed Overall Cognitive Status: Within Functional Limits for tasks assessed                                        Exercises      General Comments        Pertinent Vitals/Pain Pain Assessment: Faces Faces Pain Scale: Hurts a little bit Pain Location: low back and RLE Pain Descriptors / Indicators: Aching Pain Intervention(s): Limited activity within patient's tolerance;Monitored during session;Repositioned    Home Living                      Prior Function            PT Goals (current goals can now be found in the care plan section) Acute Rehab PT Goals Patient Stated Goal: get home to grandbabies PT Goal Formulation: With patient Time For Goal Achievement: 06/09/20 Potential to Achieve Goals: Good Progress towards PT goals: Progressing toward goals    Frequency    Min 5X/week      PT Plan Current plan remains appropriate    Co-evaluation              AM-PAC PT "6 Clicks" Mobility   Outcome Measure  Help needed turning from your back to your side while in a flat bed without using bedrails?: A Little Help needed moving from lying on your back to sitting on the side of a flat bed without using bedrails?: A Little Help needed moving to and from a bed to a chair (including a wheelchair)?: A Little Help needed standing up from a chair using your arms (e.g., wheelchair or bedside chair)?: A Little Help needed to walk in hospital room?: A Little Help needed climbing 3-5 steps with a railing? : A Lot 6 Click  Score: 17    End of Session Equipment Utilized During Treatment: Gait belt Activity Tolerance: Patient tolerated treatment well Patient left: in chair;with call bell/phone within reach;with chair alarm set Nurse Communication: Mobility status PT Visit Diagnosis: Unsteadiness on feet (R26.81);Muscle weakness (generalized) (M62.81);Pain;Other abnormalities of gait and mobility (R26.89);Difficulty in walking, not elsewhere classified (R26.2) Pain - Right/Left: Right Pain - part of body: Leg     Time: 6599-3570 PT Time Calculation (min) (ACUTE ONLY): 31 min  Charges:  $Gait Training: 8-22 mins $Therapeutic Activity: 8-22 mins                     Moishe Spice, PT, DPT Acute Rehabilitation Services  Pager: [phone] Office: [place] 05/29/2020, 12:24 PM

## 2020-05-29 NOTE — Progress Notes (Signed)
   Providing Compassionate, Quality Care - Together   Subjective: Patient reports her pain continues to improve. When asked about discharge, she now would like to be considered for CIR or wait until Wednesday to go home. I explained that she was progressing to a point where Home Health PT/OT would better serve her recovery.  Objective: Vital signs in last 24 hours: Temp:  [97.5 F (36.4 C)-98.4 F (36.9 C)] 97.6 F (36.4 C) (02/21 0345) Pulse Rate:  [77-105] 77 (02/21 0345) Resp:  [16-18] 18 (02/21 0345) BP: (113-127)/(77-84) 125/84 (02/21 0345) SpO2:  [93 %-100 %] 99 % (02/21 0345)  Intake/Output from previous day: 02/20 0701 - 02/21 0700 In: 240 [P.O.:240] Out: -  Intake/Output this shift: No intake/output data recorded.  Alert and oriented x 4 PERRLA CN II-XII grossly intact, aside from very HOH MAE, Strength and sensationappear grossly intact Incision is covered with Honeycomb dressing and Steri Strips; Dressing is clean, dry, and intact  Lab Results: Recent Labs    05/28/20 0422 05/29/20 0218  WBC 15.4* 13.1*  HGB 12.9 12.5  HCT 38.5 37.7  PLT 267 255   BMET Recent Labs    05/28/20 0422 05/29/20 0218  NA 137 139  K 3.8 4.3  CL 102 105  CO2 25 26  GLUCOSE 153* 100*  BUN 16 17  CREATININE 1.02* 1.08*  CALCIUM 9.4 9.3    Studies/Results: No results found.  Assessment/Plan: The patient is four days status post right L3-4 far lateral discectomy by Dr. Arnoldo Morale. She is doing well, but doesn't want to be discharged yet.    LOS: 16 days    -Patient is fine to discharge home from a Neurosurgical perspective -Follow up in the office with Dr. Arnoldo Morale in three weeks   Viona Gilmore, [place], AGNP-C Nurse Practitioner  [facility] [place]. [address], [place], [place] [address] P: [phone]    F: [phone]  05/29/2020, 11:01 AM

## 2020-05-30 DIAGNOSIS — M5431 Sciatica, right side: Secondary | ICD-10-CM | POA: Diagnosis not present

## 2020-05-30 MED ORDER — LIDOCAINE 5 % EX PTCH: 1.0000 | MEDICATED_PATCH | CUTANEOUS | 0 refills | Status: DC

## 2020-05-30 MED ORDER — GABAPENTIN 100 MG PO CAPS: 100.0000 mg | ORAL_CAPSULE | Freq: Every day | ORAL | 0 refills | Status: DC

## 2020-05-30 MED ORDER — AMLODIPINE BESYLATE 5 MG PO TABS: 5.0000 mg | ORAL_TABLET | Freq: Every day | ORAL | 0 refills | Status: DC

## 2020-05-30 MED ORDER — OXYCODONE HCL 5 MG PO TABS: 5.0000 mg | ORAL_TABLET | Freq: Four times a day (QID) | ORAL | 0 refills | Status: DC | PRN

## 2020-05-30 MED ORDER — POLYETHYLENE GLYCOL 3350 17 G PO PACK: 17.0000 g | PACK | Freq: Two times a day (BID) | ORAL | 0 refills | Status: DC

## 2020-05-30 MED ORDER — DOCUSATE SODIUM 100 MG PO CAPS: 100.0000 mg | ORAL_CAPSULE | Freq: Two times a day (BID) | ORAL | 0 refills | Status: DC

## 2020-05-30 MED ORDER — CYCLOBENZAPRINE HCL 10 MG PO TABS: 10.0000 mg | ORAL_TABLET | Freq: Three times a day (TID) | ORAL | 0 refills | Status: DC | PRN

## 2020-05-30 NOTE — Progress Notes (Signed)
Discharge instructions (including medications) discussed with and copy provided to patient/caregiver 
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
## 2020-05-30 NOTE — TOC Transition Note (Signed)
Transition of Care [facility]) - CM/SW Discharge Note   Patient Details  Name: MEKAELA AZIZI MRN: [id number] Date of Birth: 07-19-1945  Transition of Care [facility]) CM/SW Contact:  Pollie Friar, RN Phone Number: 05/30/2020, 10:35 AM   Clinical Narrative:    Pt is discharging home with [facility] services through [place]. Cory with [facility] aware of d/c home.  [facility] for home is at the bedside.  Pt has supervision at home and transportation home.  Final next level of care: [place] Barriers to Discharge: Barriers Resolved   Patient Goals and CMS Choice Patient states their goals for this hospitalization and ongoing recovery are:: Go home. CMS Medicare.gov Compare Post Acute Care list provided to:: Patient Choice offered to / list presented to : Patient  Discharge Placement                       Discharge Plan and Services In-house Referral: NA Discharge Planning Services: NA Post Acute Care Choice: [place]          DME Arranged: Walker rolling with seat DME Agency: [employer]       HH Arranged: PT,OT [place] Agency: [place] Date [place]: 05/30/20   Representative spoke with at [place]: [place] ([place]) Interventions     Readmission Risk Interventions No flowsheet data found.

## 2020-05-30 NOTE — Plan of Care (Signed)
  Problem: Education: Goal: Knowledge of risk factors and measures for prevention of condition will improve Outcome: Adequate for Discharge   

## 2020-05-31 ENCOUNTER — Telehealth: Payer: Self-pay

## 2020-05-31 NOTE — Telephone Encounter (Signed)
Transition Care Management Unsuccessful Follow-up Telephone Call  Date of discharge and from where:  05/30/2020, [facility]  Attempts:  1st Attempt  Reason for unsuccessful TCM follow-up call:  Unable to leave message, call placed to # [phone], voicemail full.  Need to discuss scheduling a follow up appointment with Dr Juleen China at [facility]

## 2020-05-31 NOTE — Telephone Encounter (Addendum)
Transition Care Management Unsuccessful Follow-up Telephone Call  Date of discharge and from where:  05/30/2020 from [facility]  Attempts:  1st Attempt  Reason for unsuccessful TCM follow-up call:  Unable to leave message

## 2020-06-01 ENCOUNTER — Telehealth: Payer: Self-pay

## 2020-06-01 NOTE — Telephone Encounter (Signed)
Transition Care Management Unsuccessful Follow-up Telephone Call  Date of discharge and from where:  05/30/2020, [facility]  Attempts:  2 st Attempt  Reason for unsuccessful TCM follow-up call:  Unable to leave message, call placed to # [phone], voicemail full.  Need to discuss scheduling a follow up appointment with Dr Juleen China at [facility]

## 2020-06-01 NOTE — Telephone Encounter (Signed)
Transition Care Management Unsuccessful Follow-up Telephone Call  Date of discharge and from where:  05/30/2020 from [facility]  Attempts:  2nd Attempt  Reason for unsuccessful TCM follow-up call:  Unable to leave message

## 2020-06-02 ENCOUNTER — Telehealth: Payer: Self-pay

## 2020-06-02 NOTE — Telephone Encounter (Signed)
Transition Care Management Unsuccessful Follow-up Telephone Call  Date of discharge and from where:  05/30/2020 from [facility]  Attempts:  3rd Attempt  Reason for unsuccessful TCM follow-up call:  Unable to reach patient

## 2020-06-02 NOTE — Telephone Encounter (Signed)
Transition Care Management Unsuccessful Follow-up Telephone Call  Date of discharge and from where:05/30/2020, [facility] Attempts:3 rd  Attempt  Reason for unsuccessful TCM follow-up call:Unable to leave message, call placed to # [phone], voicemail full.  Need to discuss scheduling a follow up appointment with Dr Juleen China at [facility]

## 2020-06-05 ENCOUNTER — Telehealth: Payer: Self-pay

## 2020-06-05 ENCOUNTER — Telehealth: Payer: Self-pay | Admitting: Internal Medicine

## 2020-06-05 NOTE — Telephone Encounter (Signed)
Letter sent to patient instructing her to call Primary Care at [facility] to schedule a hospital follow up appointment as we have not been able to reach her,

## 2020-06-05 NOTE — Telephone Encounter (Signed)
Pt had surgery and is out of the OXYCODONE 5 mg- Pain having back back and is requesting a refill if possible, or something stronger than that. Pt states she's been having trouble sleeping as well. Please advise and thank you  1) Medication(s) Requested (by name): oxyCODONE (OXY IR/ROXICODONE) 5 MG immediate release tablet  2) Pharmacy of Choice: Pharmacy  [id number] - [place], [address]   3) Special Requests: Approved medications will be sent to the pharmacy, we will reach out if there is an issue.  Requests made after 3pm may not be addressed until the following business day!  If a patient is unsure of the name of the medication(s) please note and ask patient to call back when they are able to provide all info, do not send to responsible party until all information is available!

## 2020-06-09 ENCOUNTER — Emergency Department (HOSPITAL_BASED_OUTPATIENT_CLINIC_OR_DEPARTMENT_OTHER)
Admit: 2020-06-09 | Discharge: 2020-06-09 | Disposition: A | Discharge status: Home / Self Care | Payer: Medicare PPO | Attending: Emergency Medicine | Admitting: Emergency Medicine

## 2020-06-09 ENCOUNTER — Encounter (HOSPITAL_COMMUNITY): Payer: Self-pay

## 2020-06-09 ENCOUNTER — Other Ambulatory Visit: Payer: Self-pay

## 2020-06-09 ENCOUNTER — Emergency Department (HOSPITAL_COMMUNITY)
Admission: EM | Admit: 2020-06-09 | Discharge: 2020-06-09 | Disposition: A | Discharge status: Home / Self Care | Payer: Medicare PPO | Attending: Emergency Medicine | Admitting: Emergency Medicine

## 2020-06-09 DIAGNOSIS — Z79899 Other long term (current) drug therapy: Secondary | ICD-10-CM | POA: Insufficient documentation

## 2020-06-09 DIAGNOSIS — Z7982 Long term (current) use of aspirin: Secondary | ICD-10-CM | POA: Diagnosis not present

## 2020-06-09 DIAGNOSIS — Z8616 Personal history of COVID-19: Secondary | ICD-10-CM | POA: Diagnosis not present

## 2020-06-09 DIAGNOSIS — B369 Superficial mycosis, unspecified: Secondary | ICD-10-CM | POA: Insufficient documentation

## 2020-06-09 DIAGNOSIS — R609 Edema, unspecified: Secondary | ICD-10-CM

## 2020-06-09 DIAGNOSIS — E039 Hypothyroidism, unspecified: Secondary | ICD-10-CM | POA: Insufficient documentation

## 2020-06-09 DIAGNOSIS — R34 Anuria and oliguria: Secondary | ICD-10-CM | POA: Insufficient documentation

## 2020-06-09 DIAGNOSIS — I10 Essential (primary) hypertension: Secondary | ICD-10-CM | POA: Diagnosis not present

## 2020-06-09 DIAGNOSIS — J45909 Unspecified asthma, uncomplicated: Secondary | ICD-10-CM | POA: Diagnosis not present

## 2020-06-09 DIAGNOSIS — E876 Hypokalemia: Secondary | ICD-10-CM

## 2020-06-09 DIAGNOSIS — I2510 Atherosclerotic heart disease of native coronary artery without angina pectoris: Secondary | ICD-10-CM | POA: Diagnosis not present

## 2020-06-09 DIAGNOSIS — R600 Localized edema: Secondary | ICD-10-CM

## 2020-06-09 LAB — CBC WITH DIFFERENTIAL/PLATELET
Abs Immature Granulocytes: 0.02 10*3/uL (ref 0.00–0.07)
Basophils Absolute: 0 10*3/uL (ref 0.0–0.1)
Basophils Relative: 0 %
Eosinophils Absolute: 0.3 10*3/uL (ref 0.0–0.5)
Eosinophils Relative: 4 %
HCT: 35.7 % — ABNORMAL LOW (ref 36.0–46.0)
Hemoglobin: 11.6 g/dL — ABNORMAL LOW (ref 12.0–15.0)
Immature Granulocytes: 0 %
Lymphocytes Relative: 26 %
Lymphs Abs: 2 10*3/uL (ref 0.7–4.0)
MCH: 33 pg (ref 26.0–34.0)
MCHC: 32.5 g/dL (ref 30.0–36.0)
MCV: 101.4 fL — ABNORMAL HIGH (ref 80.0–100.0)
Monocytes Absolute: 0.8 10*3/uL (ref 0.1–1.0)
Monocytes Relative: 10 %
Neutro Abs: 4.6 10*3/uL (ref 1.7–7.7)
Neutrophils Relative %: 60 %
Platelets: 364 10*3/uL (ref 150–400)
RBC: 3.52 MIL/uL — ABNORMAL LOW (ref 3.87–5.11)
RDW: 14.7 % (ref 11.5–15.5)
WBC: 7.7 10*3/uL (ref 4.0–10.5)
nRBC: 0 % (ref 0.0–0.2)

## 2020-06-09 LAB — COMPREHENSIVE METABOLIC PANEL
ALT: 17 U/L (ref 0–44)
AST: 15 U/L (ref 15–41)
Albumin: 2.9 g/dL — ABNORMAL LOW (ref 3.5–5.0)
Alkaline Phosphatase: 85 U/L (ref 38–126)
Anion gap: 9 (ref 5–15)
BUN: 13 mg/dL (ref 8–23)
CO2: 24 mmol/L (ref 22–32)
Calcium: 9 mg/dL (ref 8.9–10.3)
Chloride: 107 mmol/L (ref 98–111)
Creatinine, Ser: 0.93 mg/dL (ref 0.44–1.00)
GFR, Estimated: >60 mL/min (ref >60)
Glucose, Bld: 116 mg/dL — ABNORMAL HIGH (ref 70–99)
Potassium: 3.1 mmol/L — ABNORMAL LOW (ref 3.5–5.1)
Sodium: 140 mmol/L (ref 135–145)
Total Bilirubin: 0.8 mg/dL (ref 0.3–1.2)
Total Protein: 6.1 g/dL — ABNORMAL LOW (ref 6.5–8.1)

## 2020-06-09 LAB — LACTIC ACID, PLASMA: Lactic Acid, Venous: 0.8 mmol/L (ref 0.5–1.9)

## 2020-06-09 MED ORDER — FUROSEMIDE 20 MG PO TABS: 20.0000 mg | ORAL_TABLET | Freq: Every day | ORAL | 0 refills | Status: DC

## 2020-06-09 MED ORDER — DEXAMETHASONE SODIUM PHOSPHATE 10 MG/ML IJ SOLN
10.0000 mg | Freq: Once | INTRAMUSCULAR | Status: AC
  Administered 2020-06-09: 10 mg via INTRAVENOUS
  Filled 2020-06-09: qty 1

## 2020-06-09 MED ORDER — CEPHALEXIN 500 MG PO CAPS
500.0000 mg | ORAL_CAPSULE | Freq: Once | ORAL | Status: AC
  Administered 2020-06-09: 500 mg via ORAL
  Filled 2020-06-09: qty 1

## 2020-06-09 MED ORDER — POTASSIUM CHLORIDE CRYS ER 20 MEQ PO TBCR
40.0000 meq | EXTENDED_RELEASE_TABLET | Freq: Once | ORAL | Status: AC
  Administered 2020-06-09: 40 meq via ORAL
  Filled 2020-06-09: qty 2

## 2020-06-09 MED ORDER — MICONAZOLE NITRATE 2 % EX POWD: CUTANEOUS | 0 refills | Status: DC | PRN

## 2020-06-09 MED ORDER — OXYCODONE HCL 5 MG PO TABS: 5.0000 mg | ORAL_TABLET | Freq: Four times a day (QID) | ORAL | 0 refills | Status: AC | PRN

## 2020-06-09 MED ORDER — CEPHALEXIN 500 MG PO CAPS: 500.0000 mg | ORAL_CAPSULE | Freq: Four times a day (QID) | ORAL | 0 refills | Status: DC

## 2020-06-09 MED ORDER — OXYCODONE HCL 5 MG PO TABS
5.0000 mg | ORAL_TABLET | Freq: Once | ORAL | Status: AC
  Administered 2020-06-09: 5 mg via ORAL
  Filled 2020-06-09: qty 1

## 2020-06-09 NOTE — Discharge Instructions (Addendum)
You have been seen and discharged from the emergency department.  You have swelling of the bilateral lower extremities, this could be because you are more immobile.  Keep the left foot dressed, the blister will probably drain on its own.    Take antibiotic as directed. You were given a dose of steroids in the department. Take diuretic to help with lower extremity swelling as directed.  Use fungal powder as directed.  You have been given 3 more days of pain medicine to make it to Monday to be able to call your surgeon to extend if necessary.  Do not mix this pain medicine with alcohol, do not do heavy physical activity or drive until you know how it affects you, it may cause drowsiness.    Your potassium was slightly low today, you were given supplements in the department.   Follow-up with your primary provider for repeat labs and your surgeon for reevaluation. Take home medications as prescribed. If you have any worsening symptoms, worsening swelling of the legs, worsening redness of the legs, high fevers, chest pain, difficulty breathing or further concerns for health please return to an emergency department for further evaluation.

## 2020-06-09 NOTE — ED Notes (Signed)
Patients foot wrapped.

## 2020-06-09 NOTE — Progress Notes (Signed)
Bilateral lower extremity venous duplex has been completed. Preliminary results can be found in CV Proc through chart review.  Results were given to Dr. Dina Rich.  06/09/20 7:22 PM Carlos Levering RVT

## 2020-06-09 NOTE — ED Provider Notes (Signed)
[facility] DEPT Provider Note   CSN: [id number] Arrival date & time: 06/09/20  1624     History Chief Complaint  Patient presents with  . Leg Swelling    Kayla [name] is a 75 y.o. female.  HPI   75 year old female past medical history of hypothyroidism, HTN, HLD, CAD who is status post L3-L4 discectomy with Dr. Arnoldo Morale on 05/25/2020 presents the emergency department with concern for decreased urine production and acute on chronic swelling/redness of her bilateral lower extremities.  Patient states over the last 2 days she has noticed that her urine production has decreased.  She denies any difficulty passing urine, she does not have the sensation that she has to go, no dysuria, no hematuria.  She also denies any incontinence or saddle anesthesia.  Over the last day the patient has noticed swelling of her bilateral lower extremities worsening specifically the shins with scattered areas of redness that is worse on the left.  When she woke up this morning she had a large blister on the dorsal aspect of the left foot.  Patient states that her back pain has been chronic and has not acutely changed.  Denies any fever.  Does endorse fatigue.  No nausea/vomiting/diarrhea.  Past Medical History:  Diagnosis Date  . Anemia   . Anxiety   . Asthma   . B12 deficiency   . Bursitis   . Chronic LBP   . Chronic UTI   . Diastolic dysfunction   . Fibromyalgia   . GERD (gastroesophageal reflux disease)   . Hyperlipemia   . Hypertension   . Hypothyroid   . IBS (irritable bowel syndrome)   . Inflammatory osteoarthritis   . Kidney infection   . Psoriatic arthritis ([place])   . S/P cardiac cath 11/30/2007   normal coronaries - Dr. Burt Knack (Dr. Debara Pickett reviewed films on 01/10/2016)    Patient Active Problem List   Diagnosis Date Noted  . Lumbar herniated disc 05/25/2020  . Hyponatremia 05/12/2020  . Sciatica 05/12/2020  . COVID-19 05/09/2020  . Hypokalemia 05/04/2020  .  Dysphagia 04/17/2020  . Renal insufficiency 12/29/2018  . Cyst of left ovary 12/29/2018  . Esophageal dysmotility 12/29/2018  . 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 Acquired hypothyroidism 12/29/2018  . Chronic maxillary sinusitis 07/07/2018  . Snoring 06/01/2018  . Costochondritis 06/01/2018  . Family history of heart disease 06/01/2018  . Class 2 severe obesity due to excess calories with serious comorbidity and body mass index (BMI) of 38.0 to 38.9 in adult ([place]) 06/01/2018  . Multiple atypical skin moles 04/28/2017  . Depression, recurrent ([place]) 07/16/2016  . Adjustment disorder with mixed anxiety and depressed mood 01/15/2016  . Fe Def Anemia 12/22/2015  . Scoliosis (and kyphoscoliosis), idiopathic 12/20/2015  . Osteopenia 12/20/2015  . Fibromyalgia 08/02/2014  . GAD (generalized anxiety disorder) 09/17/2012  . Vitamin D deficiency 01/24/2012  . Fatigue 10/14/2011  . B12 deficiency   . Hyperlipidemia 11/30/2007  . Coronary artery disease involving native heart without angina pectoris 11/30/2007  . Essential hypertension 01/27/2007  . Reactive airway disease that is not asthma 01/27/2007  . GERD 01/27/2007  . IBS 01/27/2007    Past Surgical History:  Procedure Laterality Date  . CESAREAN SECTION  06/10/1975  . CHOLECYSTECTOMY    . LUMBAR LAMINECTOMY/DECOMPRESSION MICRODISCECTOMY Right 05/25/2020   Procedure: MICRODISCECTOMY Right Lumbar three - four;  Surgeon: Newman Pies, MD;  Location: [facility];  Service: Neurosurgery;  Laterality: Right;  . NECK SURGERY  [place] WITH ANESTHESIA N/A 05/16/2020   Procedure: MRI LUMBAR SPINE W/CONTRAST;  Surgeon: Radiologist, Medication, MD;  Location: [facility];  Service: Radiology;  Laterality: N/A;  . repair broken C6 & C7       OB History   No obstetric history on file.     Family History  Problem Relation Age of Onset  . Heart attack Father 98  . Lung cancer Other        uncle, non smoker  .  Leukemia Other        uncle  . Cancer Mother        oral  . Stroke Mother   . Alzheimer's disease Mother        at 6  . Cancer Maternal Aunt        breast  . Alcohol abuse Maternal Aunt   . Alcohol abuse Maternal Uncle   . Cancer Maternal Uncle        lung, stomach, oral  . Cancer Paternal Uncle        lung  . Congestive Heart Failure Maternal Grandfather   . Heart disease Maternal Grandfather   . Heart attack Paternal Grandfather   . Cancer Paternal Uncle        bone  . Brain cancer Maternal Grandmother   . Alzheimer's disease Other        maternal great grandmother   . Colon cancer Neg Hx   . Breast cancer Neg Hx   . Diabetes Neg Hx     Social History   Tobacco Use  . Smoking status: Never Smoker  . Smokeless tobacco: Never Used  Vaping Use  . Vaping Use: Never used  Substance Use Topics  . Alcohol use: No  . Drug use: No    Home Medications Prior to Admission medications   Medication Sig Start Date End Date Taking? Authorizing Provider  acetaminophen (TYLENOL) 500 MG tablet Take 1,000 mg by mouth in the morning, at noon, in the evening, and at bedtime.    [provider]  amLODipine (NORVASC) 5 MG tablet Take 1 tablet (5 mg total) by mouth daily. 05/30/20 06/29/20  Antonieta Pert, MD  Ascorbic Acid (VITAMIN C) 1000 MG tablet Take 500 mg by mouth daily.    [provider]  aspirin EC 81 MG tablet Take 1 tablet (81 mg total) by mouth daily. Patient not taking: Reported on 05/13/2020 12/15/18   Buford Dresser, MD  Calcium-Magnesium-Zinc (CAL-MAG-ZINC PO) Take by mouth 2 (two) times daily.    [provider]  cetirizine (ZYRTEC ALLERGY) 10 MG tablet Take 1 tablet (10 mg total) by mouth daily. Patient not taking: Reported on 05/13/2020 04/22/20   Hall-Potvin, [place], PA-C  Cholecalciferol (VITAMIN D3) 25 MCG (1000 UT) CAPS Take 1,000 Units by mouth daily.    [provider]  clotrimazole (LOTRIMIN) 1 % cream Apply to affected area 2  times daily 03/16/20   Hall-Potvin, [place], PA-C  cyanocobalamin ([facility] VITAMIN B12) 1000 MCG tablet Take 1 tablet (1,000 mcg total) by mouth daily. 03/02/19   Ladell Pier, MD  cyclobenzaprine (FLEXERIL) 10 MG tablet Take 1 tablet (10 mg total) by mouth 3 (three) times daily as needed for muscle spasms. 05/30/20   Antonieta Pert, MD  docusate sodium (COLACE) 100 MG capsule Take 1 capsule (100 mg total) by mouth 2 (two) times daily. 05/30/20   Antonieta Pert, MD  ferrous sulfate 325 (65 FE) MG tablet Take 325 mg by mouth 2 (  two) times daily with a meal.    [provider]  fluticasone (FLONASE) 50 MCG/ACT nasal spray Place 1 spray into both nostrils daily. Patient not taking: Reported on 05/13/2020 04/22/20   Hall-Potvin, [place], PA-C  gabapentin (NEURONTIN) 100 MG capsule Take 1 capsule (100 mg total) by mouth at bedtime. 05/30/20 06/29/20  Antonieta Pert, MD  guaiFENesin (MUCINEX) 600 MG 12 hr tablet Take 1 tablet (600 mg total) by mouth 2 (two) times daily. Patient not taking: Reported on 05/13/2020 05/09/20 08/07/20  Mayers, Loraine Grip, PA-C  levothyroxine (SYNTHROID) 125 MCG tablet Take 1 tablet (125 mcg total) by mouth daily. 05/04/20 07/03/20  Mayers, Cari S, PA-C  lidocaine (LIDODERM) 5 % Place 1 patch onto the skin daily. Remove & Discard patch within 12 hours or as directed by MD Patient not taking: Reported on 05/13/2020 03/29/20   Crescent Gotham, Alvin Critchley, DO  lidocaine (LIDODERM) 5 % Place 1 patch onto the skin daily. Remove & Discard patch within 12 hours or as directed by MD 05/30/20   Antonieta Pert, MD  miconazole (MICOTIN) 2 % powder Apply topically as needed for itching. 03/16/20   Hall-Potvin, [place], PA-C  nystatin (MYCOSTATIN/NYSTOP) powder Apply 1 application topically 3 (three) times daily. 02/17/20   Nicolette Bang, DO  oxyCODONE (OXY IR/ROXICODONE) 5 MG immediate release tablet Take 1 tablet (5 mg total) by mouth every 6 (six) hours as needed for up to 15 doses for severe pain ((score 4 to  6)). 05/30/20   Kc, Maren Beach, MD  polyethylene glycol (MIRALAX / GLYCOLAX) 17 g packet Take 17 g by mouth 2 (two) times daily. 05/30/20   Antonieta Pert, MD  pyridOXINE (VITAMIN B-6) 100 MG tablet Take 100 mg by mouth daily.    [provider]  triamcinolone (KENALOG) 0.1 % Apply 1 application topically 2 (two) times daily. 03/16/20   Hall-Potvin, [place], PA-C  zinc gluconate 50 MG tablet Take 50 mg by mouth daily.    [provider]    Allergies    Ace inhibitors, Lisinopril, and Other  Review of Systems   Review of Systems  Constitutional: Negative for chills and fever.  HENT: Negative for congestion.   Eyes: Negative for visual disturbance.  Respiratory: Negative for shortness of breath.   Cardiovascular: Negative for chest pain.  Gastrointestinal: Negative for abdominal pain, diarrhea and vomiting.  Genitourinary: Positive for frequency. Negative for difficulty urinating, dysuria, flank pain and hematuria.  Musculoskeletal:       + Bilateral lower extremity edema, redness, blister formation, chronic ongoing lower back pain without any acute change or worsening  Skin:       + Redness of the lower extremities  Neurological: Negative for numbness and headaches.    Physical Exam Updated Vital Signs BP 124/78   Pulse (!) 107   Temp 98.3 F (36.8 C) (Oral)   Resp 18   SpO2 97%   Physical Exam Vitals and nursing note reviewed.  Constitutional:      Appearance: Normal appearance.  HENT:     Head: Normocephalic.     Mouth/Throat:     Mouth: Mucous membranes are moist.  Cardiovascular:     Rate and Rhythm: Normal rate.  Pulmonary:     Effort: Pulmonary effort is normal. No respiratory distress.  Abdominal:     Palpations: Abdomen is soft.     Tenderness: There is no abdominal tenderness.  Musculoskeletal:     Comments: Midline lumbar spine vertical incision that is currently dressed, no  bleeding or significant surrounding swelling/redness.  The bilateral lower  extremities have about 3+ pitting edema.  The left second toe has dried blood at the distal part of the nail.  There is scattered erythema and warmth of the bilateral shins, most significant on the left extending to the dorsal aspect of the left foot where there is a large blister filled with clear fluid.  Skin:    General: Skin is warm.  Neurological:     Mental Status: She is alert and oriented to person, place, and time.     Comments: Sensory is intact and equal bilaterally, no saddle anesthesia  Psychiatric:        Mood and Affect: Mood normal.     ED Results / Procedures / Treatments   Labs (all labs ordered are listed, but only abnormal results are displayed) Labs Reviewed  CBC WITH DIFFERENTIAL/PLATELET  COMPREHENSIVE METABOLIC PANEL  URINALYSIS, ROUTINE W REFLEX MICROSCOPIC  LACTIC ACID, PLASMA  LACTIC ACID, PLASMA    EKG None  Radiology No results found.  Procedures Procedures   Medications Ordered in ED Medications - No data to display  ED Course  I have reviewed the triage vital signs and the nursing notes.  Pertinent labs & imaging results that were available during my care of the patient were reviewed by me and considered in my medical decision making (see chart for details).    MDM Rules/Calculators/A&P                          75 year old female presents the emergency department with a acute on chronic bilateral lower extremity edema and redness.  She did develop a blister on the left foot.  She denies any fever.  She is had ongoing lower back pain that has been improved postoperatively, has no complaints of complications.  She feels as if her urine has been decreased but denies any urinary retention, incontinence, dysuria.  Bladder scan shows no urinary retention.  She is neurovascularly intact in the lower extremities, no saddle anesthesia.  Blood work shows no signs of sepsis.  She has mild hypokalemia, this was replaced.  I suspect the lower extremity  edema has been slightly worse than baseline due to her immobility from her recent surgery.  Ultrasound shows no DVT.    Patient will be treated preemptively with an antibiotic due to the worsening redness, we will wrap the left lower extremity.  She is currently ambulatory.  Will prescribe her a couple days of Lasix to help diurese. Advise for LE elevation. She has no chest pain, shortness of breath, orthopnea.  No symptoms of CHF, lungs are clear.  Vitals are stable, she is well-appearing.  Is motivated to go home.  Her pain medicine runs out tomorrow, I will prescribe her 2 more days to get her through to Monday so she can call her neurosurgeon for extension if needed.  Also has a side note she does have a fungal rash underneath her bilateral breasts, miconazole powder prescription has been sent.  Patient will be discharged and treated as an outpatient.  Discharge plan and strict return to ED precautions discussed, patient verbalizes understanding and agreement.  Final Clinical Impression(s) / ED Diagnoses Final diagnoses:  None    Rx / DC Orders ED Discharge Orders    None       Lorelle Gibbs, DO 06/09/20 2249

## 2020-06-09 NOTE — ED Notes (Signed)
Vascular at bedside

## 2020-06-09 NOTE — ED Notes (Signed)
Asked patient to provide urine sample. Patient will use call bell when she can give a sample.

## 2020-06-09 NOTE — ED Triage Notes (Signed)
Pt reports having recent back surgery. Pt states that she noticed bilateral leg swelling 3-4 days ago. Pt reports developing a blister in the top of her left foot last night, which is now leaking. Pt also states that she has not been peeing as often as normal.

## 2020-06-12 ENCOUNTER — Telehealth: Payer: Self-pay | Admitting: *Deleted

## 2020-06-12 NOTE — Telephone Encounter (Signed)
Transition Care Management Follow-up Telephone Call  Date of discharge and from where: [name]  How have you been since you were released from the hospital?  Still has blister and swelling  Any questions or concerns? No   Patient just started medication on Sunday only had 1 dose, is going to give it a couple days for medication to work  Items Reviewed:  Did the pt receive and understand the discharge instructions provided? Yes   Medications obtained and verified? Yes   Other? No   Any new allergies since your discharge? No   Dietary orders reviewed? na  Do you have support at home? Yes   Patient lives with son and grandchildren    Functional Questionnaire: (I = Independent and D = Dependent) ADLs: I  Bathing/Dressing- I  Meal Prep- I  Eating- I  Maintaining continence- I  Transferring/Ambulation- I  Managing Meds- I  Follow up appointments reviewed:   PCP Hospital f/u appt confirmed? No   Specialist Hospital f/u appt confirmed? No  .  Are transportation arrangements needed? No   If their condition worsens, is the pt aware to call PCP or go to the Emergency Dept.? yes  Was the patient provided with contact information for the PCP's office or ED? yes  Was to pt encouraged to call back with questions or concerns? yes

## 2020-06-13 ENCOUNTER — Telehealth: Payer: Self-pay

## 2020-06-13 NOTE — Telephone Encounter (Signed)
Since she has had surgery, she will need to contact her surgeon for Oxycodone refill or whatever they deem appropriate. Please schedule her for virtual visit to discuss sleep problems.   Phill Myron, D.O. Primary Care at [facility]  06/13/2020, 12:54 PM

## 2020-06-13 NOTE — Telephone Encounter (Signed)
Reached out to the patient by phone 2x's to provide information on the [facility] locations. [facility].

## 2020-06-13 NOTE — Telephone Encounter (Signed)
Pls contact pt and schedule virtual visit w/ Dr. Juleen China to discuss sleep issues.

## 2020-06-14 ENCOUNTER — Ambulatory Visit: Payer: Medicare PPO | Admitting: Physician Assistant

## 2020-06-14 ENCOUNTER — Other Ambulatory Visit: Payer: Self-pay

## 2020-06-14 VITALS — BP 142/80 | HR 70 | Temp 98.0°F | Resp 18

## 2020-06-14 DIAGNOSIS — E876 Hypokalemia: Secondary | ICD-10-CM

## 2020-06-14 DIAGNOSIS — L03115 Cellulitis of right lower limb: Secondary | ICD-10-CM | POA: Diagnosis not present

## 2020-06-14 DIAGNOSIS — E871 Hypo-osmolality and hyponatremia: Secondary | ICD-10-CM

## 2020-06-14 DIAGNOSIS — R600 Localized edema: Secondary | ICD-10-CM

## 2020-06-14 DIAGNOSIS — L03116 Cellulitis of left lower limb: Secondary | ICD-10-CM

## 2020-06-14 NOTE — Patient Instructions (Addendum)
I encourage you to continue the antibiotics.  Continue to hold amlodipine at this time.  It is okay to take the dose of Lasix today.  We will call you with the lab results tomorrow to let you know how the kidney function is doing with the use of Lasix.  Make sure you are keeping your feet elevated much as possible.  We will do a virtual visit with you on Monday to follow up.  Kennieth Rad, PA-C Physician Assistant [facility] Medicine [email or web address]    Cellulitis, Adult  Cellulitis is a skin infection. The infected area is usually warm, red, swollen, and tender. This condition occurs most often in the arms and lower legs. The infection can travel to the muscles, blood, and underlying tissue and become serious. It is very important to get treated for this condition. What are the causes? Cellulitis is caused by bacteria. The bacteria enter through a break in the skin, such as a cut, burn, insect bite, open sore, or crack. What increases the risk? This condition is more likely to occur in people who:  Have a weak body defense system (immune system).  Have open wounds on the skin, such as cuts, burns, bites, and scrapes. Bacteria can enter the body through these open wounds.  Are older than 75 years of age.  Have diabetes.  Have a type of long-lasting (chronic) liver disease (cirrhosis) or kidney disease.  Are obese.  Have a skin condition such as: ? Itchy rash (eczema). ? Slow movement of blood in the veins (venous stasis). ? Fluid buildup below the skin (edema).  Have had radiation therapy.  Use IV drugs. What are the signs or symptoms? Symptoms of this condition include:  Redness, streaking, or spotting on the skin.  Swollen area of the skin.  Tenderness or pain when an area of the skin is touched.  Warm skin.  A fever.  Chills.  Blisters. How is this diagnosed? This condition is diagnosed based on a medical  history and physical exam. You may also have tests, including:  Blood tests.  Imaging tests. How is this treated? Treatment for this condition may include:  Medicines, such as antibiotic medicines or medicines to treat allergies (antihistamines).  Supportive care, such as rest and application of cold or warm cloths (compresses) to the skin.  Hospital care, if the condition is severe. The infection usually starts to get better within 1-2 days of treatment. Follow these instructions at home: Medicines  Take over-the-counter and prescription medicines only as told by your health care provider.  If you were prescribed an antibiotic medicine, take it as told by your health care provider. Do not stop taking the antibiotic even if you start to feel better. General instructions  Drink enough fluid to keep your urine pale yellow.  Do not touch or rub the infected area.  Raise (elevate) the infected area above the level of your heart while you are sitting or lying down.  Apply warm or cold compresses to the affected area as told by your health care provider.  Keep all follow-up visits as told by your health care provider. This is important. These visits let your health care provider make sure a more serious infection is not developing.   Contact a health care provider if:  You have a fever.  Your symptoms do not begin to improve within 1-2 days of starting treatment.  Your bone or joint underneath the infected area becomes painful after the skin has healed.  Your infection returns in the same area or another area.  You notice a swollen bump in the infected area.  You develop new symptoms.  You have a general ill feeling (malaise) with muscle aches and pains. Get help right away if:  Your symptoms get worse.  You feel very sleepy.  You develop vomiting or diarrhea that persists.  You notice red streaks coming from the infected area.  Your red area gets larger or turns  dark in color. These symptoms may represent a serious problem that is an emergency. Do not wait to see if the symptoms will go away. Get medical help right away. Call your local emergency services (911 in the [place] Do not drive yourself to the hospital. Summary  Cellulitis is a skin infection. This condition occurs most often in the arms and lower legs.  Treatment for this condition may include medicines, such as antibiotic medicines or antihistamines.  Take over-the-counter and prescription medicines only as told by your health care provider. If you were prescribed an antibiotic medicine, do not stop taking the antibiotic even if you start to feel better.  Contact a health care provider if your symptoms do not begin to improve within 1-2 days of starting treatment or your symptoms get worse.  Keep all follow-up visits as told by your health care provider. This is important. These visits let your health care provider make sure that a more serious infection is not developing. This information is not intended to replace advice given to you by your health care provider. Make sure you discuss any questions you have with your health care provider. Document Revised: 04/05/2019 Document Reviewed: 08/14/2017 Elsevier Patient Education  [place].

## 2020-06-14 NOTE — Progress Notes (Signed)
Established Patient Office Visit  Subjective:  Patient ID: Kayla [name], female    DOB: [birth year]  Age: 75 y.o. MRN: [id number]  CC:  Chief Complaint  Patient presents with  . Leg Swelling    bilateral    HPI [name] reports that she was seen at the emergency department on June 09, 2020.  Hospital course                     75 year old female presents the emergency department with a acute on chronic bilateral lower extremity edema and redness.  She did develop a blister on the left foot.  She denies any fever.  She is had ongoing lower back pain that has been improved postoperatively, has no complaints of complications.  She feels as if her urine has been decreased but denies any urinary retention, incontinence, dysuria.  Bladder scan shows no urinary retention.  She is neurovascularly intact in the lower extremities, no saddle anesthesia.  Blood work shows no signs of sepsis.  She has mild hypokalemia, this was replaced.  I suspect the lower extremity edema has been slightly worse than baseline due to her immobility from her recent surgery.  Ultrasound shows no DVT.    Patient will be treated preemptively with an antibiotic due to the worsening redness, we will wrap the left lower extremity.  She is currently ambulatory.  Will prescribe her a couple days of Lasix to help diurese. Advise for LE elevation. She has no chest pain, shortness of breath, orthopnea.  No symptoms of CHF, lungs are clear.  Vitals are stable, she is well-appearing.  Is motivated to go home.  Her pain medicine runs out tomorrow, I will prescribe her 2 more days to get her through to Monday so she can call her neurosurgeon for extension if needed.  Also has a side note she does have a fungal rash underneath her bilateral breasts, miconazole powder prescription has been sent.  Patient will be discharged and treated as an outpatient.  Discharge plan and strict return to ED precautions discussed, patient verbalizes  understanding and agreement  Since she has been home states that she stopped the amlodipine on her own.  Reports that she was started on amlodipine while in the hospital for her back surgery.  Reports prior to starting amlodipine she has not had any episodes of  lower leg edema.  Reports that she last took amlodipine on Saturday.  Reports that she took a dose of Lasix on Sunday and then again on Tuesday.  Reports that she has noticed some improvement in the swelling, however she has had another blister on her left lower leg appear.  Reports she continues to have pain.    Past Medical History:  Diagnosis Date  . Anemia   . Anxiety   . Asthma   . B12 deficiency   . Bursitis   . Chronic LBP   . Chronic UTI   . Diastolic dysfunction   . Fibromyalgia   . GERD (gastroesophageal reflux disease)   . Hyperlipemia   . Hypertension   . Hypothyroid   . IBS (irritable bowel syndrome)   . Inflammatory osteoarthritis   . Kidney infection   . Psoriatic arthritis ([place])   . S/P cardiac cath 11/30/2007   normal coronaries - Dr. Burt Knack (Dr. Debara Pickett reviewed films on 01/10/2016)    Past Surgical History:  Procedure Laterality Date  . CESAREAN SECTION  06/10/1975  . CHOLECYSTECTOMY    .  LUMBAR LAMINECTOMY/DECOMPRESSION MICRODISCECTOMY Right 05/25/2020   Procedure: MICRODISCECTOMY Right Lumbar three - four;  Surgeon: Newman Pies, MD;  Location: [facility];  Service: Neurosurgery;  Laterality: Right;  . NECK SURGERY  1998  . RADIOLOGY WITH ANESTHESIA N/A 05/16/2020   Procedure: MRI LUMBAR SPINE W/CONTRAST;  Surgeon: Radiologist, Medication, MD;  Location: [facility];  Service: Radiology;  Laterality: N/A;  . repair broken C6 & C7      Family History  Problem Relation Age of Onset  . Heart attack Father 48  . Lung cancer Other        uncle, non smoker  . Leukemia Other        uncle  . Cancer Mother        oral  . Stroke Mother   . Alzheimer's disease Mother        at 13  . Cancer Maternal Aunt         breast  . Alcohol abuse Maternal Aunt   . Alcohol abuse Maternal Uncle   . Cancer Maternal Uncle        lung, stomach, oral  . Cancer Paternal Uncle        lung  . Congestive Heart Failure Maternal Grandfather   . Heart disease Maternal Grandfather   . Heart attack Paternal Grandfather   . Cancer Paternal Uncle        bone  . Brain cancer Maternal Grandmother   . Alzheimer's disease Other        maternal great grandmother   . Colon cancer Neg Hx   . Breast cancer Neg Hx   . Diabetes Neg Hx     Social History   Socioeconomic History  . Marital status: Widowed    Spouse name: Not on file  . Number of children: 2  . Years of education: Not on file  . Highest education level: Bachelor's degree (e.g., BA, AB, BS)  Occupational History  . Not on file  Tobacco Use  . Smoking status: Never Smoker  . Smokeless tobacco: Never Used  Vaping Use  . Vaping Use: Never used  Substance and Sexual Activity  . Alcohol use: No  . Drug use: No  . Sexual activity: Not on file  Other Topics Concern  . Not on file  Social History Narrative   Lost husband 2009, lost mother 2016      Lives at home (son & 3 grandchildren live with her)   Caffeine: 10-12 oz daily   Social Determinants of Health   Financial Resource Strain: Not on file  Food Insecurity: Not on file  Transportation Needs: Not on file  Physical Activity: Not on file  Stress: Not on file  Social Connections: Not on file  Intimate Partner Violence: Not on file    Outpatient Medications Prior to Visit  Medication Sig Dispense Refill  . acetaminophen (TYLENOL) 500 MG tablet Take 1,000 mg by mouth in the morning, at noon, in the evening, and at bedtime.    Marland Kitchen amLODipine (NORVASC) 5 MG tablet Take 1 tablet (5 mg total) by mouth daily. 30 tablet 0  . Ascorbic Acid (VITAMIN C) 1000 MG tablet Take 500 mg by mouth daily.    . Calcium-Magnesium-Zinc (CAL-MAG-ZINC PO) Take 1 tablet by mouth 2 (two) times daily.    . cephALEXin  (KEFLEX) 500 MG capsule Take 1 capsule (500 mg total) by mouth 4 (four) times daily. 20 capsule 0  . Cholecalciferol (VITAMIN D3) 25 MCG (1000 UT) CAPS Take 1,000  Units by mouth daily.    . cyanocobalamin ([employer] VITAMIN B12) 1000 MCG tablet Take 1 tablet (1,000 mcg total) by mouth daily. 90 tablet 1  . cyclobenzaprine (FLEXERIL) 10 MG tablet Take 1 tablet (10 mg total) by mouth 3 (three) times daily as needed for muscle spasms. 30 tablet 0  . ferrous sulfate 325 (65 FE) MG tablet Take 325 mg by mouth 2 (two) times daily with a meal.    . furosemide (LASIX) 20 MG tablet Take 1 tablet (20 mg total) by mouth daily for 5 days. 5 tablet 0  . levothyroxine (SYNTHROID) 125 MCG tablet Take 1 tablet (125 mcg total) by mouth daily. 30 tablet 1  . lidocaine (LIDODERM) 5 % Place 1 patch onto the skin daily. Remove & Discard patch within 12 hours or as directed by MD 30 patch 0  . aspirin EC 81 MG tablet Take 1 tablet (81 mg total) by mouth daily. (Patient not taking: Reported on 06/14/2020)    . cetirizine (ZYRTEC ALLERGY) 10 MG tablet Take 1 tablet (10 mg total) by mouth daily. (Patient not taking: No sig reported) 30 tablet 0  . clotrimazole (LOTRIMIN) 1 % cream Apply to affected area 2 times daily (Patient not taking: No sig reported) 15 g 0  . docusate sodium (COLACE) 100 MG capsule Take 1 capsule (100 mg total) by mouth 2 (two) times daily. (Patient not taking: No sig reported) 10 capsule 0  . fluticasone (FLONASE) 50 MCG/ACT nasal spray Place 1 spray into both nostrils daily. (Patient not taking: No sig reported) 16 g 0  . gabapentin (NEURONTIN) 100 MG capsule Take 1 capsule (100 mg total) by mouth at bedtime. (Patient not taking: No sig reported) 30 capsule 0  . guaiFENesin (MUCINEX) 600 MG 12 hr tablet Take 1 tablet (600 mg total) by mouth 2 (two) times daily. (Patient not taking: No sig reported) 60 tablet 2  . lidocaine (LIDODERM) 5 % Place 1 patch onto the skin daily. Remove & Discard patch within 12 hours  or as directed by MD (Patient not taking: No sig reported) 30 patch 0  . miconazole (MICOTIN) 2 % powder Apply topically as needed for itching. 70 g 0  . nystatin (MYCOSTATIN/NYSTOP) powder Apply 1 application topically 3 (three) times daily. (Patient not taking: Reported on 06/09/2020) 60 g 0  . oxyCODONE-acetaminophen (PERCOCET/ROXICET) 5-325 MG tablet Take 1 tablet by mouth 4 (four) times daily as needed.    . polyethylene glycol (MIRALAX / GLYCOLAX) 17 g packet Take 17 g by mouth 2 (two) times daily. (Patient not taking: No sig reported) 14 each 0  . pyridOXINE (VITAMIN B-6) 100 MG tablet Take 100 mg by mouth daily.    Marland Kitchen triamcinolone (KENALOG) 0.1 % Apply 1 application topically 2 (two) times daily. (Patient not taking: Reported on 06/09/2020) 45 each 0  . zinc gluconate 50 MG tablet Take 50 mg by mouth daily.     No facility-administered medications prior to visit.    Allergies  Allergen Reactions  . Ace Inhibitors Anaphylaxis    REACTION: glossal edema  . Lisinopril Anaphylaxis    ROS Review of Systems  Constitutional: Negative for chills and fever.  HENT: Negative.   Respiratory: Negative for shortness of breath and wheezing.   Cardiovascular: Negative for chest pain and palpitations.  Gastrointestinal: Negative.   Endocrine: Negative.   Genitourinary: Negative.   Musculoskeletal: Positive for gait problem and joint swelling.  Skin: Positive for color change.  Allergic/Immunologic: Negative.  Hematological: Negative.   Psychiatric/Behavioral: Negative.       Objective:    Physical Exam Vitals and nursing note reviewed.  Constitutional:      General: She is not in acute distress.    Appearance: Normal appearance.  HENT:     Head: Normocephalic and atraumatic.     Right Ear: External ear normal.     Left Ear: External ear normal.     Nose: Nose normal.     Mouth/Throat:     Mouth: Mucous membranes are moist.     Pharynx: Oropharynx is clear.  Eyes:      Extraocular Movements: Extraocular movements intact.     Conjunctiva/sclera: Conjunctivae normal.     Pupils: Pupils are equal, round, and reactive to light.  Cardiovascular:     Rate and Rhythm: Normal rate and regular rhythm.     Pulses: Normal pulses.     Heart sounds: Normal heart sounds.  Pulmonary:     Effort: Pulmonary effort is normal.     Breath sounds: Normal breath sounds.  Musculoskeletal:        General: Swelling present.     Cervical back: Normal range of motion and neck supple.     Right lower leg: 2+ Edema present.     Left lower leg: 2+ Edema present.  Skin:    Findings: Erythema present. No abscess.       Neurological:     General: No focal deficit present.     Mental Status: She is alert and oriented to person, place, and time.  Psychiatric:        Mood and Affect: Mood normal.        Behavior: Behavior normal.        Thought Content: Thought content normal.        Judgment: Judgment normal.     BP (!) 142/80 (BP Location: Left Arm, Patient Position: Sitting, Cuff Size: Large)   Pulse 70   Temp 98 F (36.7 C) (Temporal)   Resp 18   SpO2 97%  Wt Readings from Last 3 Encounters:  05/25/20 206 lb 5.6 oz (93.6 kg)  04/17/20 212 lb 6.4 oz (96.3 kg)  02/17/20 213 lb (96.6 kg)     Health Maintenance Due  Topic Date Due  . DEXA SCAN  Never done  . COLONOSCOPY (Pts 45-80yrs Insurance coverage will need to be confirmed)  05/19/2012    There are no preventive care reminders to display for this patient.  Lab Results  Component Value Date   TSH 2.570 05/12/2020   Lab Results  Component Value Date   WBC 7.7 06/09/2020   HGB 11.6 (L) 06/09/2020   HCT 35.7 (L) 06/09/2020   MCV 101.4 (H) 06/09/2020   PLT 364 06/09/2020   Lab Results  Component Value Date   NA 143 06/14/2020   K 3.7 06/14/2020   CO2 24 06/09/2020   GLUCOSE 96 06/14/2020   BUN 12 06/14/2020   CREATININE 1.07 (H) 06/14/2020   BILITOT 0.5 06/14/2020   ALKPHOS 113 06/14/2020   AST  20 06/14/2020   ALT 17 06/09/2020   PROT 6.1 06/14/2020   ALBUMIN 3.4 (L) 06/14/2020   CALCIUM 9.6 06/14/2020   ANIONGAP 9 06/09/2020   GFR 53.94 (L) 01/16/2015   Lab Results  Component Value Date   CHOL 166 11/02/2018   Lab Results  Component Value Date   HDL 56 11/02/2018   Lab Results  Component Value Date   LDLCALC 84  11/02/2018   Lab Results  Component Value Date   TRIG 132 11/02/2018   Lab Results  Component Value Date   CHOLHDL 3.0 11/02/2018   Lab Results  Component Value Date   HGBA1C 5.6 11/02/2018      Assessment & Plan:   Problem List Items Addressed This Visit      Other   Hypokalemia - Primary   Relevant Orders   Comp. Metabolic Panel (12) (Completed)     1. Bilateral lower extremity edema Continue antibiotic regimen, continue to hold amlodipine at this time.  Continue dose of Lasix today, on review of hospital note from surgery, chlorthalidone was held due to hyponatremia, patient was started on amlodipine and blood pressure was stable.  Patient did discontinue amlodipine on her own thinking the  swelling was a reaction from the amlodipine.  Patient scheduled for virtual visit with mobile medicine unit on Monday, March 14 for follow-up.  Red flags given for prompt reevaluation in the emergency department  2. Cellulitis of leg, left   3. Cellulitis of leg, right   4. Hyponatremia  - Comp. Metabolic Panel (12)   I have reviewed the patient's medical history (PMH, PSH, Social History, Family History, Medications, and allergies) , and have been updated if relevant. I spent 30 minutes reviewing chart and  face to face time with patient.     No orders of the defined types were placed in this encounter.   Follow-up: Return in about 5 days (around 06/19/2020).    Loraine Grip Mayers, PA-C

## 2020-06-15 LAB — COMP. METABOLIC PANEL (12)
AST: 20 IU/L (ref 0–40)
Albumin/Globulin Ratio: 1.3 (ref 1.2–2.2)
Albumin: 3.4 g/dL — ABNORMAL LOW (ref 3.7–4.7)
Alkaline Phosphatase: 113 IU/L (ref 44–121)
BUN/Creatinine Ratio: 11 — ABNORMAL LOW (ref 12–28)
BUN: 12 mg/dL (ref 8–27)
Bilirubin Total: 0.5 mg/dL (ref 0.0–1.2)
Calcium: 9.6 mg/dL (ref 8.7–10.3)
Chloride: 105 mmol/L (ref 96–106)
Creatinine, Ser: 1.07 mg/dL — ABNORMAL HIGH (ref 0.57–1.00)
Globulin, Total: 2.7 g/dL (ref 1.5–4.5)
Glucose: 96 mg/dL (ref 65–99)
Potassium: 3.7 mmol/L (ref 3.5–5.2)
Sodium: 143 mmol/L (ref 134–144)
Total Protein: 6.1 g/dL (ref 6.0–8.5)
eGFR: 54 mL/min/{1.73_m2} — ABNORMAL LOW (ref >59)

## 2020-06-16 ENCOUNTER — Telehealth: Payer: Self-pay | Admitting: Internal Medicine

## 2020-06-16 NOTE — Telephone Encounter (Signed)
Pt called stating medications need refilled. Pt states had dressing change at mobile unit this week but legs and feet still hurt. Pt req lasix and pain medication. Pt states finished keflex. Please advise.

## 2020-06-17 DIAGNOSIS — L03116 Cellulitis of left lower limb: Secondary | ICD-10-CM | POA: Insufficient documentation

## 2020-06-17 DIAGNOSIS — L03115 Cellulitis of right lower limb: Secondary | ICD-10-CM | POA: Insufficient documentation

## 2020-06-17 DIAGNOSIS — R600 Localized edema: Secondary | ICD-10-CM | POA: Insufficient documentation

## 2020-06-19 ENCOUNTER — Ambulatory Visit: Payer: Medicare PPO | Admitting: *Deleted

## 2020-06-19 DIAGNOSIS — L03115 Cellulitis of right lower limb: Secondary | ICD-10-CM

## 2020-06-19 NOTE — Telephone Encounter (Signed)
Patients medical questions were addressed by [facility] RMA and provider during visit with 06/19/20.

## 2020-06-19 NOTE — Progress Notes (Signed)
MA spoke with patient regarding appointment today. Patient request refills on medications that she was advised to finish from 06/09/20. Patient has appointment with surgeon on tomorrow and wants to request pain medication from their office. MA did advise patient of slight decrease in kidney function and to take 1/2 tablet of lasix daily or whole tablet EOD. Patient will contact [facility] tomorrow after appointment with surgeon.

## 2020-06-19 NOTE — Telephone Encounter (Signed)
If patient is still having pain and swelling despite antibiotics, this is concerning for antibiotic failure of cellulitis. Would recommend she be seen urgently with mobile unit/Urgent Care/ER for further evaluation and to determine next steps.   Phill Myron, D.O. Primary Care at [facility]  06/19/2020, 2:28 PM

## 2020-06-19 NOTE — Progress Notes (Signed)
Patient received results while speaking with the nurse during virtual advice visit.

## 2020-06-20 DIAGNOSIS — I10 Essential (primary) hypertension: Secondary | ICD-10-CM | POA: Insufficient documentation

## 2020-06-20 DIAGNOSIS — Z6833 Body mass index (BMI) 33.0-33.9, adult: Secondary | ICD-10-CM | POA: Insufficient documentation

## 2020-06-20 DIAGNOSIS — Z9889 Other specified postprocedural states: Secondary | ICD-10-CM | POA: Insufficient documentation

## 2020-06-20 NOTE — Telephone Encounter (Signed)
Called patient (twice) to f/u  Unable to reach, mailbox is full unable to leave voicemail.

## 2020-06-21 ENCOUNTER — Other Ambulatory Visit: Payer: Self-pay

## 2020-06-21 ENCOUNTER — Encounter: Payer: Self-pay | Admitting: Internal Medicine

## 2020-06-21 ENCOUNTER — Ambulatory Visit (INDEPENDENT_AMBULATORY_CARE_PROVIDER_SITE_OTHER): Payer: Medicare PPO | Admitting: Internal Medicine

## 2020-06-21 VITALS — BP 108/80 | HR 97 | Temp 97.3°F | Wt 198.0 lb

## 2020-06-21 DIAGNOSIS — L03119 Cellulitis of unspecified part of limb: Secondary | ICD-10-CM | POA: Diagnosis not present

## 2020-06-21 DIAGNOSIS — R7989 Other specified abnormal findings of blood chemistry: Secondary | ICD-10-CM

## 2020-06-21 DIAGNOSIS — R34 Anuria and oliguria: Secondary | ICD-10-CM

## 2020-06-21 DIAGNOSIS — R319 Hematuria, unspecified: Secondary | ICD-10-CM | POA: Diagnosis not present

## 2020-06-21 LAB — POCT URINALYSIS DIP (CLINITEK)
Glucose, UA: NEGATIVE mg/dL
Nitrite, UA: NEGATIVE
POC PROTEIN,UA: 100 — AB
Spec Grav, UA: >=1.03 — AB (ref 1.010–1.025)
Urobilinogen, UA: 0.2 E.U./dL
pH, UA: 6 (ref 5.0–8.0)

## 2020-06-21 MED ORDER — DOXYCYCLINE HYCLATE 100 MG PO TABS: 100.0000 mg | ORAL_TABLET | Freq: Two times a day (BID) | ORAL | 0 refills | Status: DC

## 2020-06-21 NOTE — Progress Notes (Signed)
F/u cellulitis Told that the ATB could be affecting urine output and kidneys. Requested urinalysis.  Patient is doing a lot of scratching during rooming  Not taking BP medications- 108/80

## 2020-06-21 NOTE — Patient Instructions (Signed)
Start Doxycycline tonight. If your redness has not gone down after 24 hours (3 total doses of antibiotics) go to the ER. If at any time the redness extends beyond the marker line, you have increased swelling/pain, you have fevers/chills, or vomiting go to the ER.

## 2020-06-21 NOTE — Progress Notes (Signed)
Subjective:    Kayla [name] - 75 y.o. female MRN [id number]  Date of birth: 1945/09/15  HPI  Kayla [name] is here for follow up of cellulitis. She was seen in ER on 3/4 for worsening of LE edema and acute on chronic erythema. She had bilateral LE dopplers that were negative for DVT. She was prescribed short term course of Lasix. Was also given antibiotic to treat for presumed cellulitis. She completed course of Keflex yesterday. She had increasing erythema today. Thought her neurosurgeon who saw her recently prescribed another antibiotic but has not been yet to pick up from pharmacy. She is needing to use rolling walker to ambulate due to pain and tightness in her LE. She denies fevers, chills, vomiting. Has had blisters develop in several places on her legs, appears worse than when initially started.   At the time, she also endorsed scant urine. Bladder scan was done that showed no urinary retention. Reports she was urinating quite a bit while taking Lasix. She has sensation of pressure in her bladder at times. Denies dysuria, frequency, urgency. No gross hematuria.    Health Maintenance:  Health Maintenance Due  Topic Date Due  . DEXA SCAN  Never done  . COLONOSCOPY (Pts 45-63yrs Insurance coverage will need to be confirmed)  05/19/2012    -  reports that she has never smoked. She has never used smokeless tobacco. - Review of Systems: Per HPI. - Past Medical History: Patient Active Problem List   Diagnosis Date Noted  . Bilateral lower extremity edema 06/17/2020  . Cellulitis of leg, left 06/17/2020  . Cellulitis of leg, right 06/17/2020  . Lumbar herniated disc 05/25/2020  . Hyponatremia 05/12/2020  . Sciatica 05/12/2020  . COVID-19 05/09/2020  . Hypokalemia 05/04/2020  . Dysphagia 04/17/2020  . Renal insufficiency 12/29/2018  . Cyst of left ovary 12/29/2018  . Esophageal dysmotility 12/29/2018  . 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 Acquired  hypothyroidism 12/29/2018  . Chronic maxillary sinusitis 07/07/2018  . Snoring 06/01/2018  . Costochondritis 06/01/2018  . Family history of heart disease 06/01/2018  . Class 2 severe obesity due to excess calories with serious comorbidity and body mass index (BMI) of 38.0 to 38.9 in adult ([place]) 06/01/2018  . Multiple atypical skin moles 04/28/2017  . Depression, recurrent ([place]) 07/16/2016  . Adjustment disorder with mixed anxiety and depressed mood 01/15/2016  . Fe Def Anemia 12/22/2015  . Scoliosis (and kyphoscoliosis), idiopathic 12/20/2015  . Osteopenia 12/20/2015  . Fibromyalgia 08/02/2014  . GAD (generalized anxiety disorder) 09/17/2012  . Vitamin D deficiency 01/24/2012  . Fatigue 10/14/2011  . B12 deficiency   . Hyperlipidemia 11/30/2007  . Coronary artery disease involving native heart without angina pectoris 11/30/2007  . Essential hypertension 01/27/2007  . Reactive airway disease that is not asthma 01/27/2007  . GERD 01/27/2007  . IBS 01/27/2007   - Medications: reviewed and updated   Objective:   Physical Exam BP 108/80   Pulse 97   Temp (!) 97.3 F (36.3 C)   Wt 198 lb (89.8 kg)   SpO2 98%   BMI 33.97 kg/m  Physical Exam Constitutional:      General: She is not in acute distress.    Appearance: She is not diaphoretic.  Cardiovascular:     Rate and Rhythm: Normal rate.  Pulmonary:     Effort: Pulmonary effort is normal. No respiratory distress.  Musculoskeletal:        General: Normal range of motion.  Skin:    Comments: Bilateral erythema with increased warmth to just below knee bilaterally with several areas of weeping blisters present.   Neurological:     Mental Status: She is alert and oriented to person, place, and time.  Psychiatric:        Mood and Affect: Affect normal.        Judgment: Judgment normal.            Assessment & Plan:   1. Cellulitis of lower extremity, unspecified laterality Patient with persistent cellulitis that has  not responded to full course of Keflex. I am concerned about antibiotic therapy failure; although, give presence of pustular drainage may have a component of MRSA that was not adequately covered by prior abx therapy. Therefore, will prescribe Doxycyline to include MRSA coverage. Have marked the borders of the erythema. Discussed with patient if extends beyond borders or does not improve within 24-48 hours or develops signs of systemic illness, needs to present to ED immediately. Reassuringly is afebrile currently with stable vitals signs. Had recent LE dopplers performed negative for DVT bilaterally.  - doxycycline (VIBRA-TABS) 100 MG tablet; Take 1 tablet (100 mg total) by mouth 2 (two) times daily.  Dispense: 20 tablet; Refill: 0  2. Scanty urine - POCT URINALYSIS DIP (CLINITEK)  3. Hematuria, unspecified type Reviewed with patient that during hospitalization in Feb she had 11-20 RBCs present on urine. Will monitor.  - Urinalysis, Routine w reflex microscopic - Urine Culture  4. Elevated serum creatinine Cr was slightly elevated at visit on 3/9. Will monitor. Encouraged adequate hydration.  - Basic Metabolic Panel   Phill Myron, D.O. 06/21/2020, 4:33 PM Primary Care at [facility]

## 2020-06-24 LAB — MICROSCOPIC EXAMINATION
Bacteria, UA: NONE SEEN
RBC, Urine: NONE SEEN /hpf (ref 0–2)

## 2020-06-24 LAB — URINALYSIS, ROUTINE W REFLEX MICROSCOPIC
Bilirubin, UA: NEGATIVE
Glucose, UA: NEGATIVE
Ketones, UA: NEGATIVE
Nitrite, UA: NEGATIVE
RBC, UA: NEGATIVE
Specific Gravity, UA: 1.017 (ref 1.005–1.030)
Urobilinogen, Ur: 0.2 mg/dL (ref 0.2–1.0)
pH, UA: 6.5 (ref 5.0–7.5)

## 2020-06-26 ENCOUNTER — Telehealth: Payer: Self-pay | Admitting: Internal Medicine

## 2020-06-26 NOTE — Telephone Encounter (Addendum)
Pt called wanting appt today for sensitive legs and pink on right leg. Pt says concerned she may have to go to hospital and wants eyes on it to make sure if she needs to. Also pt said she will go to urgent care today if PCP is unable to see her. Please advise.

## 2020-06-28 ENCOUNTER — Other Ambulatory Visit: Payer: Self-pay

## 2020-06-28 ENCOUNTER — Ambulatory Visit (INDEPENDENT_AMBULATORY_CARE_PROVIDER_SITE_OTHER): Payer: Medicare PPO | Admitting: Family

## 2020-06-28 ENCOUNTER — Encounter: Payer: Self-pay | Admitting: Family

## 2020-06-28 VITALS — BP 138/91 | HR 100 | Wt 192.1 lb

## 2020-06-28 DIAGNOSIS — L03119 Cellulitis of unspecified part of limb: Secondary | ICD-10-CM | POA: Diagnosis not present

## 2020-06-28 DIAGNOSIS — L03116 Cellulitis of left lower limb: Secondary | ICD-10-CM | POA: Diagnosis not present

## 2020-06-28 DIAGNOSIS — L03115 Cellulitis of right lower limb: Secondary | ICD-10-CM | POA: Diagnosis not present

## 2020-06-28 MED ORDER — DOXYCYCLINE HYCLATE 100 MG PO TABS
100.0000 mg | ORAL_TABLET | Freq: Two times a day (BID) | ORAL | 0 refills | Status: DC
Start: 2020-06-28 — End: 2020-07-27

## 2020-06-28 NOTE — Telephone Encounter (Signed)
Scheduled with provider at [facility] 06/28/2020

## 2020-06-28 NOTE — Patient Instructions (Addendum)
Referral to Wound Care.  Continue Doxycycline.  Follow-up with primary provider in 1 week. Seek emergency medical assistance if symptoms worsen or become severe. Cellulitis, Adult  Cellulitis is a skin infection. The infected area is often warm, red, swollen, and sore. It occurs most often in the arms and lower legs. It is very important to get treated for this condition. What are the causes? This condition is caused by bacteria. The bacteria enter through a break in the skin, such as a cut, burn, insect bite, open sore, or crack. What increases the risk? This condition is more likely to occur in people who:  Have a weak body defense system (immune system).  Have open cuts, burns, bites, or scrapes on the skin.  Are older than 75 years of age.  Have a blood sugar problem (diabetes).  Have a long-lasting (chronic) liver disease (cirrhosis) or kidney disease.  Are very overweight (obese).  Have a skin problem, such as: ? Itchy rash (eczema). ? Slow movement of blood in the veins (venous stasis). ? Fluid buildup below the skin (edema).  Have been treated with high-energy rays (radiation).  Use IV drugs. What are the signs or symptoms? Symptoms of this condition include:  Skin that is: ? Red. ? Streaking. ? Spotting. ? Swollen. ? Sore or painful when you touch it. ? Warm.  A fever.  Chills.  Blisters. How is this diagnosed? This condition is diagnosed based on:  Medical history.  Physical exam.  Blood tests.  Imaging tests. How is this treated? Treatment for this condition may include:  Medicines to treat infections or allergies.  Home care, such as: ? Rest. ? Placing cold or warm cloths (compresses) on the skin.  Hospital care, if the condition is very bad. Follow these instructions at home: Medicines  Take over-the-counter and prescription medicines only as told by your doctor.  If you were prescribed an antibiotic medicine, take it as told by  your doctor. Do not stop taking it even if you start to feel better. General instructions  Drink enough fluid to keep your pee (urine) pale yellow.  Do not touch or rub the infected area.  Raise (elevate) the infected area above the level of your heart while you are sitting or lying down.  Place cold or warm cloths on the area as told by your doctor.  Keep all follow-up visits as told by your doctor. This is important.   Contact a doctor if:  You have a fever.  You do not start to get better after 1-2 days of treatment.  Your bone or joint under the infected area starts to hurt after the skin has healed.  Your infection comes back. This can happen in the same area or another area.  You have a swollen bump in the area.  You have new symptoms.  You feel ill and have muscle aches and pains. Get help right away if:  Your symptoms get worse.  You feel very sleepy.  You throw up (vomit) or have watery poop (diarrhea) for a long time.  You see red streaks coming from the area.  Your red area gets larger.  Your red area turns dark in color. These symptoms may represent a serious problem that is an emergency. Do not wait to see if the symptoms will go away. Get medical help right away. Call your local emergency services (911 in the [place] Do not drive yourself to the hospital. Summary  Cellulitis is a skin infection.  The area is often warm, red, swollen, and sore.  This condition is treated with medicines, rest, and cold and warm cloths.  Take all medicines only as told by your doctor.  Tell your doctor if symptoms do not start to get better after 1-2 days of treatment. This information is not intended to replace advice given to you by your health care provider. Make sure you discuss any questions you have with your health care provider. Document Revised: 08/14/2017 Document Reviewed: 08/14/2017 Elsevier Patient Education  [place].

## 2020-06-28 NOTE — Progress Notes (Signed)
Cellulitis -legs just not healing fast enough

## 2020-06-28 NOTE — Progress Notes (Addendum)
Patient ID: Kayla [name], female    DOB: October 25, 1945  MRN: [id number]  CC: Cellulitis Follow-Up Subjective: Kayla [name] is a 75 y.o. female who presents for cellulitis follow-up.  Her concerns today include:   1. CELLULITIS FOLLOW-UP: 06/21/2020 per DO note: Patient with persistent cellulitis that has not responded to full course of Keflex. I am concerned about antibiotic therapy failure; although, give presence of pustular drainage may have a component of MRSA that was not adequately covered by prior abx therapy. Therefore, will prescribe Doxycyline to include MRSA coverage. Have marked the borders of the erythema. Discussed with patient if extends beyond borders or does not improve within 24-48 hours or develops signs of systemic illness, needs to present to ED immediately. Reassuringly is afebrile currently with stable vitals signs. Had recent LE dopplers performed negative for DVT bilaterally.   06/28/2020: Reports cellulitis has improved since last visit and has decreased in size from marked borders. Concern that there is not complete resolution. Expecting a family member to return home for a visit on next week and fearful that she may be hospitalized if cellulitis worsens. Reports she was told that if cellulitis worsens that she will be admitted to the hospital for an antibiotic drip. Tearful. Requesting dressings be changed. Denies pain, drainage, and swelling of bilateral lower legs. No shortness of breath. No chest pain. She is requesting Percocet for possible episodes of pain.   Patient Active Problem List   Diagnosis Date Noted  . Body mass index (BMI) 33.0-33.9, adult 06/20/2020  . Status post lumbar microdiscectomy 06/20/2020  . Essential (primary) hypertension 06/20/2020  . Bilateral lower extremity edema 06/17/2020  . Cellulitis of leg, left 06/17/2020  . Cellulitis of leg, right 06/17/2020  . Lumbar herniated disc 05/25/2020  . Hyponatremia 05/12/2020  . Sciatica 05/12/2020  .  COVID-19 05/09/2020  . Hypokalemia 05/04/2020  . Dysphagia 04/17/2020  . Renal insufficiency 12/29/2018  . Cyst of left ovary 12/29/2018  . Esophageal dysmotility 12/29/2018  . 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 Acquired hypothyroidism 12/29/2018  . Chronic maxillary sinusitis 07/07/2018  . Snoring 06/01/2018  . Costochondritis 06/01/2018  . Family history of heart disease 06/01/2018  . Class 2 severe obesity due to excess calories with serious comorbidity and body mass index (BMI) of 38.0 to 38.9 in adult ([place]) 06/01/2018  . Multiple atypical skin moles 04/28/2017  . Depression, recurrent ([place]) 07/16/2016  . Adjustment disorder with mixed anxiety and depressed mood 01/15/2016  . Fe Def Anemia 12/22/2015  . Scoliosis (and kyphoscoliosis), idiopathic 12/20/2015  . Osteopenia 12/20/2015  . Fibromyalgia 08/02/2014  . GAD (generalized anxiety disorder) 09/17/2012  . Vitamin D deficiency 01/24/2012  . Fatigue 10/14/2011  . B12 deficiency   . Hyperlipidemia 11/30/2007  . Coronary artery disease involving native heart without angina pectoris 11/30/2007  . Essential hypertension 01/27/2007  . Reactive airway disease that is not asthma 01/27/2007  . GERD 01/27/2007  . IBS 01/27/2007     Current Outpatient Medications on File Prior to Visit  Medication Sig Dispense Refill  . acetaminophen (TYLENOL) 500 MG tablet Take 1,000 mg by mouth in the morning, at noon, in the evening, and at bedtime.    Marland Kitchen amLODipine (NORVASC) 5 MG tablet Take 1 tablet (5 mg total) by mouth daily. (Patient not taking: Reported on 06/21/2020) 30 tablet 0  . Ascorbic Acid (VITAMIN C) 1000 MG tablet Take 500 mg by mouth daily.    Marland Kitchen aspirin EC 81 MG tablet  Take 1 tablet (81 mg total) by mouth daily. (Patient not taking: No sig reported)    . Calcium-Magnesium-Zinc (CAL-MAG-ZINC PO) Take 1 tablet by mouth 2 (two) times daily.    . cetirizine (ZYRTEC ALLERGY) 10 MG tablet Take 1 tablet (10 mg  total) by mouth daily. (Patient not taking: No sig reported) 30 tablet 0  . Cholecalciferol (VITAMIN D3) 25 MCG (1000 UT) CAPS Take 1,000 Units by mouth daily.    . clotrimazole (LOTRIMIN) 1 % cream Apply to affected area 2 times daily 15 g 0  . cyanocobalamin ([employer] VITAMIN B12) 1000 MCG tablet Take 1 tablet (1,000 mcg total) by mouth daily. (Patient not taking: Reported on 06/21/2020) 90 tablet 1  . cyclobenzaprine (FLEXERIL) 10 MG tablet Take 1 tablet (10 mg total) by mouth 3 (three) times daily as needed for muscle spasms. 30 tablet 0  . ferrous sulfate 325 (65 FE) MG tablet Take 325 mg by mouth 2 (two) times daily with a meal.    . fluticasone (FLONASE) 50 MCG/ACT nasal spray Place 1 spray into both nostrils daily. 16 g 0  . furosemide (LASIX) 20 MG tablet Take 1 tablet (20 mg total) by mouth daily for 5 days. 5 tablet 0  . levothyroxine (SYNTHROID) 125 MCG tablet Take 1 tablet (125 mcg total) by mouth daily. 30 tablet 1  . lidocaine (LIDODERM) 5 % Place 1 patch onto the skin daily. Remove & Discard patch within 12 hours or as directed by MD (Patient not taking: No sig reported) 30 patch 0  . miconazole (MICOTIN) 2 % powder Apply topically as needed for itching. 70 g 0  . nystatin (MYCOSTATIN/NYSTOP) powder Apply 1 application topically 3 (three) times daily. 60 g 0  . oxyCODONE-acetaminophen (PERCOCET/ROXICET) 5-325 MG tablet Take 1 tablet by mouth 4 (four) times daily as needed.    . polyethylene glycol (MIRALAX / GLYCOLAX) 17 g packet Take 17 g by mouth 2 (two) times daily. (Patient not taking: No sig reported) 14 each 0  . pyridOXINE (VITAMIN B-6) 100 MG tablet Take 100 mg by mouth daily.    Marland Kitchen triamcinolone (KENALOG) 0.1 % Apply 1 application topically 2 (two) times daily. 45 each 0  . zinc gluconate 50 MG tablet Take 50 mg by mouth daily.     No current facility-administered medications on file prior to visit.    Allergies  Allergen Reactions  . Ace Inhibitors Anaphylaxis    REACTION:  glossal edema  . Lisinopril Anaphylaxis    Social History   Socioeconomic History  . Marital status: Widowed    Spouse name: Not on file  . Number of children: 2  . Years of education: Not on file  . Highest education level: Bachelor's degree (e.g., BA, AB, BS)  Occupational History  . Not on file  Tobacco Use  . Smoking status: Never Smoker  . Smokeless tobacco: Never Used  Vaping Use  . Vaping Use: Never used  Substance and Sexual Activity  . Alcohol use: No  . Drug use: No  . Sexual activity: Not on file  Other Topics Concern  . Not on file  Social History Narrative   Lost husband 2009, lost mother 2016      Lives at home (son & 3 grandchildren live with her)   Caffeine: 10-12 oz daily   Social Determinants of Health   Financial Resource Strain: Not on file  Food Insecurity: Not on file  Transportation Needs: Not on file  Physical Activity: Not  on file  Stress: Not on file  Social Connections: Not on file  Intimate Partner Violence: Not on file    Family History  Problem Relation Age of Onset  . Heart attack Father 48  . Lung cancer Other        uncle, non smoker  . Leukemia Other        uncle  . Cancer Mother        oral  . Stroke Mother   . Alzheimer's disease Mother        at 49  . Cancer Maternal Aunt        breast  . Alcohol abuse Maternal Aunt   . Alcohol abuse Maternal Uncle   . Cancer Maternal Uncle        lung, stomach, oral  . Cancer Paternal Uncle        lung  . Congestive Heart Failure Maternal Grandfather   . Heart disease Maternal Grandfather   . Heart attack Paternal Grandfather   . Cancer Paternal Uncle        bone  . Brain cancer Maternal Grandmother   . Alzheimer's disease Other        maternal great grandmother   . Colon cancer Neg Hx   . Breast cancer Neg Hx   . Diabetes Neg Hx     Past Surgical History:  Procedure Laterality Date  . CESAREAN SECTION  06/10/1975  . CHOLECYSTECTOMY    . LUMBAR  LAMINECTOMY/DECOMPRESSION MICRODISCECTOMY Right 05/25/2020   Procedure: MICRODISCECTOMY Right Lumbar three - four;  Surgeon: Newman Pies, MD;  Location: [facility];  Service: Neurosurgery;  Laterality: Right;  . NECK SURGERY  1998  . RADIOLOGY WITH ANESTHESIA N/A 05/16/2020   Procedure: MRI LUMBAR SPINE W/CONTRAST;  Surgeon: Radiologist, Medication, MD;  Location: [facility];  Service: Radiology;  Laterality: N/A;  . repair broken C6 & C7      ROS: Review of Systems Negative except as stated above  PHYSICAL EXAM: BP (!) 138/91 (BP Location: Left Arm, Patient Position: Sitting)   Pulse 100   Wt 192 lb 1.6 oz (87.1 kg)   SpO2 98%   BMI 32.96 kg/m   Physical Exam Constitutional:      Appearance: She is obese.  HENT:     Head: Normocephalic and atraumatic.  Eyes:     Extraocular Movements: Extraocular movements intact.     Conjunctiva/sclera: Conjunctivae normal.     Pupils: Pupils are equal, round, and reactive to light.  Cardiovascular:     Rate and Rhythm: Normal rate and regular rhythm.     Pulses: Normal pulses.     Heart sounds: Normal heart sounds.  Pulmonary:     Effort: Pulmonary effort is normal.     Breath sounds: Normal breath sounds.  Musculoskeletal:     Cervical back: Normal range of motion and neck supple.  Skin:    Capillary Refill: Capillary refill takes less than 2 seconds.     Findings: Erythema present.     Comments: Bilateral lower extremity erythema and warmth below marked borders. No evidence of edema, drainage, or open skin. Appears scaly. Bilateral lower extremity dressings changed and reinforced with mepilex, honeycomb dressing, and ace bandages.  Neurological:     General: No focal deficit present.     Mental Status: She is alert and oriented to person, place, and time.  Psychiatric:        Mood and Affect: Affect is tearful.    ASSESSMENT AND PLAN: 1.  Cellulitis of lower extremity, unspecified laterality - Improved since last visit 06/21/2020  however not completely resolved. - Ultrasound showed no DVT on 06/09/2020. - Doxycycline as prescribed. - Today I have declined refill of Percocet. Offered patient to reach out to the prescribing provider for refills. Patient agreeable.  - Referral to Dermatology  for further evaluation and management.  - Follow-up with primary provider in 1 week pending Dermatology appointment.  - doxycycline (VIBRA-TABS) 100 MG tablet; Take 1 tablet (100 mg total) by mouth 2 (two) times daily.  Dispense: 20 tablet; Refill: 0 - Ambulatory referral to Dermatology   Patient was given the opportunity to ask questions.  Patient verbalized understanding of the plan and was able to repeat key elements of the plan. Patient was given clear instructions to go to Emergency Department or return to medical center if symptoms don't improve, worsen, or new problems develop.The patient verbalized understanding.   Orders Placed This Encounter  Procedures  . Ambulatory referral to Dermatology     Requested Prescriptions   Signed Prescriptions Disp Refills  . doxycycline (VIBRA-TABS) 100 MG tablet 20 tablet 0    Sig: Take 1 tablet (100 mg total) by mouth 2 (two) times daily.    Follow-up with primary provider in 1 week or sooner if needed.   Camillia Herter, NP

## 2020-06-29 LAB — URINE CULTURE

## 2020-07-07 ENCOUNTER — Ambulatory Visit: Payer: Medicare PPO | Admitting: Cardiology

## 2020-07-12 ENCOUNTER — Other Ambulatory Visit: Payer: Self-pay | Admitting: Internal Medicine

## 2020-07-12 ENCOUNTER — Encounter: Payer: Self-pay | Admitting: Emergency Medicine

## 2020-07-12 ENCOUNTER — Ambulatory Visit
Admission: EM | Admit: 2020-07-12 | Discharge: 2020-07-12 | Disposition: A | Discharge status: Home / Self Care | Payer: Medicare PPO | Attending: Internal Medicine | Admitting: Internal Medicine

## 2020-07-12 ENCOUNTER — Other Ambulatory Visit: Payer: Self-pay

## 2020-07-12 DIAGNOSIS — R399 Unspecified symptoms and signs involving the genitourinary system: Secondary | ICD-10-CM | POA: Diagnosis not present

## 2020-07-12 DIAGNOSIS — R600 Localized edema: Secondary | ICD-10-CM | POA: Diagnosis not present

## 2020-07-12 DIAGNOSIS — Z888 Allergy status to other drugs, medicaments and biological substances status: Secondary | ICD-10-CM | POA: Diagnosis not present

## 2020-07-12 DIAGNOSIS — M25561 Pain in right knee: Secondary | ICD-10-CM | POA: Insufficient documentation

## 2020-07-12 DIAGNOSIS — M25562 Pain in left knee: Secondary | ICD-10-CM

## 2020-07-12 DIAGNOSIS — Z79899 Other long term (current) drug therapy: Secondary | ICD-10-CM | POA: Diagnosis not present

## 2020-07-12 LAB — POCT URINALYSIS DIP (MANUAL ENTRY)
Glucose, UA: NEGATIVE mg/dL
Nitrite, UA: NEGATIVE
Protein Ur, POC: 100 mg/dL — AB
Spec Grav, UA: >=1.03 — AB (ref 1.010–1.025)
Urobilinogen, UA: 0.2 E.U./dL
pH, UA: 5.5 (ref 5.0–8.0)

## 2020-07-12 MED ORDER — DOUBLE ANTIBIOTIC 500-10000 UNIT/GM EX OINT: 1.0000 "application " | TOPICAL_OINTMENT | Freq: Two times a day (BID) | CUTANEOUS | 0 refills | Status: DC

## 2020-07-12 MED ORDER — FUROSEMIDE 20 MG PO TABS: 20.0000 mg | ORAL_TABLET | Freq: Every day | ORAL | 0 refills | Status: DC

## 2020-07-12 MED ORDER — FLUCONAZOLE 200 MG PO TABS: 200.0000 mg | ORAL_TABLET | Freq: Every day | ORAL | 0 refills | Status: DC

## 2020-07-12 MED ORDER — DICLOFENAC SODIUM 1 % EX GEL: 2.0000 g | Freq: Four times a day (QID) | CUTANEOUS | 0 refills | Status: DC

## 2020-07-12 MED ORDER — TIZANIDINE HCL 2 MG PO TABS: 4.0000 mg | ORAL_TABLET | Freq: Every evening | ORAL | 0 refills | Status: DC | PRN

## 2020-07-12 NOTE — ED Triage Notes (Signed)
Pt here for wound check to bilateral cellulitis on lower legs with leg pain; pt sts some dysuria also recently; sts currently taking doxycycline

## 2020-07-12 NOTE — Discharge Instructions (Signed)
Please apply Voltaren gel to areas involved Take other medications as prescribed] Emollients to the areas where the skin is desquamating Topical antibiotic to the left foot Follow-up with primary care physician and cardiologist

## 2020-07-13 NOTE — ED Provider Notes (Signed)
[name]    CSN: [id number] Arrival date & time: 07/12/20  1537      History   Chief Complaint Chief Complaint  Patient presents with  . Wound Check  . Dysuria    HPI Kayla [name] is a 75 y.o. female comes to urgent care to have leg wound reevaluated.  Patient currently on doxycycline for cellulitis in the lower extremities.  Patient is concerned that she may have some residual cellulitic changes.  No fever or chills.  No open wounds.  Demarcation at the onset of cellulitis is still present.  No erythematous lesions noted.  Patient has lower extremity swelling.  No shortness of breath, orthopnea or paroxysmal nocturnal dyspnea or chest pain.  No fever or chills.  She denies any dysuria, urgency or frequency but is requesting testing for UTI  Patient recently had back surgery and is complaining of pain in her knees and thighs as well.  No calf pain. HPI  Past Medical History:  Diagnosis Date  . Anemia   . Anxiety   . Asthma   . B12 deficiency   . Bursitis   . Chronic LBP   . Chronic UTI   . Diastolic dysfunction   . Fibromyalgia   . GERD (gastroesophageal reflux disease)   . Hyperlipemia   . Hypertension   . Hypothyroid   . IBS (irritable bowel syndrome)   . Inflammatory osteoarthritis   . Kidney infection   . Psoriatic arthritis ([place])   . S/P cardiac cath 11/30/2007   normal coronaries - Dr. Burt Knack (Dr. Debara Pickett reviewed films on 01/10/2016)    Patient Active Problem List   Diagnosis Date Noted  . Body mass index (BMI) 33.0-33.9, adult 06/20/2020  . Status post lumbar microdiscectomy 06/20/2020  . Essential (primary) hypertension 06/20/2020  . Bilateral lower extremity edema 06/17/2020  . Cellulitis of leg, left 06/17/2020  . Cellulitis of leg, right 06/17/2020  . Lumbar herniated disc 05/25/2020  . Hyponatremia 05/12/2020  . Sciatica 05/12/2020  . COVID-19 05/09/2020  . Hypokalemia 05/04/2020  . Dysphagia 04/17/2020  . Renal insufficiency  12/29/2018  . Cyst of left ovary 12/29/2018  . Esophageal dysmotility 12/29/2018  . 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 Acquired hypothyroidism 12/29/2018  . Chronic maxillary sinusitis 07/07/2018  . Snoring 06/01/2018  . Costochondritis 06/01/2018  . Family history of heart disease 06/01/2018  . Class 2 severe obesity due to excess calories with serious comorbidity and body mass index (BMI) of 38.0 to 38.9 in adult ([place]) 06/01/2018  . Multiple atypical skin moles 04/28/2017  . Depression, recurrent ([place]) 07/16/2016  . Adjustment disorder with mixed anxiety and depressed mood 01/15/2016  . Fe Def Anemia 12/22/2015  . Scoliosis (and kyphoscoliosis), idiopathic 12/20/2015  . Osteopenia 12/20/2015  . Fibromyalgia 08/02/2014  . GAD (generalized anxiety disorder) 09/17/2012  . Vitamin D deficiency 01/24/2012  . Fatigue 10/14/2011  . B12 deficiency   . Hyperlipidemia 11/30/2007  . Coronary artery disease involving native heart without angina pectoris 11/30/2007  . Essential hypertension 01/27/2007  . Reactive airway disease that is not asthma 01/27/2007  . GERD 01/27/2007  . IBS 01/27/2007    Past Surgical History:  Procedure Laterality Date  . CESAREAN SECTION  06/10/1975  . CHOLECYSTECTOMY    . LUMBAR LAMINECTOMY/DECOMPRESSION MICRODISCECTOMY Right 05/25/2020   Procedure: MICRODISCECTOMY Right Lumbar three - four;  Surgeon: Newman Pies, MD;  Location: [facility];  Service: Neurosurgery;  Laterality: Right;  . NECK SURGERY  1998  .  RADIOLOGY WITH ANESTHESIA N/A 05/16/2020   Procedure: MRI LUMBAR SPINE W/CONTRAST;  Surgeon: Radiologist, Medication, MD;  Location: [facility];  Service: Radiology;  Laterality: N/A;  . repair broken C6 & C7      OB History   No obstetric history on file.      Home Medications    Prior to Admission medications   Medication Sig Start Date End Date Taking? Authorizing Provider  diclofenac Sodium (VOLTAREN) 1 % GEL Apply 2 g  topically 4 (four) times daily. 07/12/20  Yes Trudie Cervantes, Myrene Galas, MD  furosemide (LASIX) 20 MG tablet Take 1 tablet (20 mg total) by mouth daily for 5 days. 07/12/20 07/17/20 Yes Dalen Hennessee, Myrene Galas, MD  polymixin-bacitracin (POLYSPORIN) 500-10000 UNIT/GM OINT ointment Apply 1 application topically 2 (two) times daily. 07/12/20  Yes Meaghann Choo, Myrene Galas, MD  acetaminophen (TYLENOL) 500 MG tablet Take 1,000 mg by mouth in the morning, at noon, in the evening, and at bedtime.    [provider]  Ascorbic Acid (VITAMIN C) 1000 MG tablet Take 500 mg by mouth daily.    [provider]  Calcium-Magnesium-Zinc (CAL-MAG-ZINC PO) Take 1 tablet by mouth 2 (two) times daily.    [provider]  Cholecalciferol (VITAMIN D3) 25 MCG (1000 UT) CAPS Take 1,000 Units by mouth daily.    [provider]  clotrimazole (LOTRIMIN) 1 % cream Apply to affected area 2 times daily 03/16/20   Hall-Potvin, [place], PA-C  cyclobenzaprine (FLEXERIL) 10 MG tablet Take 1 tablet (10 mg total) by mouth 3 (three) times daily as needed for muscle spasms. 05/30/20   Antonieta Pert, MD  doxycycline (VIBRA-TABS) 100 MG tablet Take 1 tablet (100 mg total) by mouth 2 (two) times daily. 06/28/20   Camillia Herter, NP  ferrous sulfate 325 (65 FE) MG tablet Take 325 mg by mouth 2 (two) times daily with a meal.    [provider]  fluconazole (DIFLUCAN) 200 MG tablet Take 1 tablet (200 mg total) by mouth daily. 07/12/20   Nicolette Bang, DO  fluticasone (FLONASE) 50 MCG/ACT nasal spray Place 1 spray into both nostrils daily. 04/22/20   Hall-Potvin, [place], PA-C  levothyroxine (SYNTHROID) 125 MCG tablet Take 1 tablet (125 mcg total) by mouth daily. 05/04/20 07/03/20  Mayers, Cari S, PA-C  miconazole (MICOTIN) 2 % powder Apply topically as needed for itching. 06/09/20   Horton, Alvin Critchley, DO  nystatin (MYCOSTATIN/NYSTOP) powder Apply 1 application topically 3 (three) times daily. 02/17/20   Nicolette Bang, DO  oxyCODONE-acetaminophen (PERCOCET/ROXICET) 5-325 MG tablet Take 1 tablet by mouth 4 (four) times daily as needed. 06/07/20   [provider]  pyridOXINE (VITAMIN B-6) 100 MG tablet Take 100 mg by mouth daily.    [provider]  triamcinolone (KENALOG) 0.1 % Apply 1 application topically 2 (two) times daily. 03/16/20   Hall-Potvin, [place], PA-C  zinc gluconate 50 MG tablet Take 50 mg by mouth daily.    [provider]  amLODipine (NORVASC) 5 MG tablet Take 1 tablet (5 mg total) by mouth daily. Patient not taking: Reported on 06/21/2020 05/30/20 07/12/20  Antonieta Pert, MD  cetirizine (ZYRTEC ALLERGY) 10 MG tablet Take 1 tablet (10 mg total) by mouth daily. Patient not taking: No sig reported 04/22/20 07/12/20  Hall-Potvin, [place], PA-C    Family History Family History  Problem Relation Age of Onset  . Heart attack Father 17  . Lung cancer Other        uncle, non  smoker  . Leukemia Other        uncle  . Cancer Mother        oral  . Stroke Mother   . Alzheimer's disease Mother        at 50  . Cancer Maternal Aunt        breast  . Alcohol abuse Maternal Aunt   . Alcohol abuse Maternal Uncle   . Cancer Maternal Uncle        lung, stomach, oral  . Cancer Paternal Uncle        lung  . Congestive Heart Failure Maternal Grandfather   . Heart disease Maternal Grandfather   . Heart attack Paternal Grandfather   . Cancer Paternal Uncle        bone  . Brain cancer Maternal Grandmother   . Alzheimer's disease Other        maternal great grandmother   . Colon cancer Neg Hx   . Breast cancer Neg Hx   . Diabetes Neg Hx     Social History Social History   Tobacco Use  . Smoking status: Never Smoker  . Smokeless tobacco: Never Used  Vaping Use  . Vaping Use: Never used  Substance Use Topics  . Alcohol use: No  . Drug use: No     Allergies   Ace inhibitors and Lisinopril   Review of Systems Review of Systems  Gastrointestinal: Negative.    Genitourinary: Negative.  Negative for dysuria, flank pain, frequency, urgency and vaginal discharge.  Musculoskeletal: Negative.   Skin: Positive for color change and wound. Negative for rash.  Neurological: Negative.      Physical Exam Triage Vital Signs ED Triage Vitals  Enc Vitals Group     BP 07/12/20 1623 (!) 151/99     Pulse Rate 07/12/20 1623 (!) 105     Resp 07/12/20 1623 18     Temp 07/12/20 1623 98.2 F (36.8 C)     Temp Source 07/12/20 1623 Oral     SpO2 07/12/20 1623 98 %     Weight --      Height --      Head Circumference --      Peak Flow --      Pain Score 07/12/20 1624 5     Pain Loc --      Pain Edu? --      Excl. in [place]? --    No data found.  Updated Vital Signs BP (!) 151/99 (BP Location: Left Arm)   Pulse (!) 105   Temp 98.2 F (36.8 C) (Oral)   Resp 18   SpO2 98%   Visual Acuity Right Eye Distance:   Left Eye Distance:   Bilateral Distance:    Right Eye Near:   Left Eye Near:    Bilateral Near:     Physical Exam Vitals and nursing note reviewed.  Constitutional:      General: She is not in acute distress.    Appearance: She is not ill-appearing.  Cardiovascular:     Rate and Rhythm: Normal rate and regular rhythm.     Pulses: Normal pulses.     Heart sounds: Normal heart sounds.  Pulmonary:     Effort: Pulmonary effort is normal.     Breath sounds: Normal breath sounds.  Abdominal:     General: Bowel sounds are normal.     Palpations: Abdomen is soft.  Musculoskeletal:        General: Swelling present. No  tenderness. Normal range of motion.     Right lower leg: Edema present.     Left lower leg: Edema present.  Skin:    General: Skin is warm.     Findings: No erythema, lesion or rash.     Comments: Desquamation of the skin on both legs and feet.  No cellulitic changes.  No open wounds.  There is a circular scab on the dorsum of the left foot.  Measures about 2 inches in the longest diameter.  Neurological:     Mental  Status: She is alert.      UC Treatments / Results  Labs (all labs ordered are listed, but only abnormal results are displayed) Labs Reviewed  POCT URINALYSIS DIP (MANUAL ENTRY) - Abnormal; Notable for the following components:      Result Value   Clarity, UA hazy (*)    Bilirubin, UA small (*)    Ketones, POC UA trace (5) (*)    Spec Grav, UA >=1.030 (*)    Blood, UA trace-intact (*)    Protein Ur, POC =100 (*)    Leukocytes, UA Trace (*)    All other components within normal limits  URINE CULTURE    EKG   Radiology No results found.  Procedures Procedures (including critical care time)  Medications Ordered in UC Medications - No data to display  Initial Impression / Assessment and Plan / UC Course  I have reviewed the triage vital signs and the nursing notes.  Pertinent labs & imaging results that were available during my care of the patient were reviewed by me and considered in my medical decision making (see chart for details).     1.  Bilateral pedal edema: Patient is scheduled to follow-up with cardiology for further evaluation Lasix 20 mg orally daily for 5 days There is no evidence of cellulitis.  Patient is advised to complete the doxycycline course. Point-of-care urinalysis is significant for trace blood and leukocyte Estrace Urine culture sent  2.  Cellulitis involving both lower extremities currently resolved Complete doxycycline course  3.  Bilateral thigh/knee pain: Voltaren use recommended Return precautions given. Final Clinical Impressions(s) / UC Diagnoses   Final diagnoses:  Pedal edema  Arthralgia of both lower legs     Discharge Instructions     Please apply Voltaren gel to areas involved Take other medications as prescribed] Emollients to the areas where the skin is desquamating Topical antibiotic to the left foot Follow-up with primary care physician and cardiologist      ED Prescriptions    Medication Sig Dispense  Auth. Provider   furosemide (LASIX) 20 MG tablet Take 1 tablet (20 mg total) by mouth daily for 5 days. 5 tablet Dawnisha Marquina, Myrene Galas, MD   tiZANidine (ZANAFLEX) 2 MG tablet  (Status: Discontinued) Take 2 tablets (4 mg total) by mouth at bedtime as needed for muscle spasms. 5 tablet Milano Rosevear, Myrene Galas, MD   polymixin-bacitracin (POLYSPORIN) 500-10000 UNIT/GM OINT ointment Apply 1 application topically 2 (two) times daily. 14 g Shirah Roseman, Myrene Galas, MD   diclofenac Sodium (VOLTAREN) 1 % GEL Apply 2 g topically 4 (four) times daily. 50 g Tye Juarez, Myrene Galas, MD     PDMP not reviewed this encounter.   Chase Picket, MD 07/13/20 [phone]

## 2020-07-17 LAB — URINE CULTURE: Culture: >=100000 — AB

## 2020-07-19 ENCOUNTER — Telehealth (HOSPITAL_COMMUNITY): Payer: Self-pay | Admitting: Emergency Medicine

## 2020-07-19 MED ORDER — CEPHALEXIN 500 MG PO CAPS: 500.0000 mg | ORAL_CAPSULE | Freq: Two times a day (BID) | ORAL | 0 refills | Status: AC

## 2020-07-24 ENCOUNTER — Encounter: Payer: Self-pay | Admitting: *Deleted

## 2020-07-24 ENCOUNTER — Other Ambulatory Visit: Payer: Self-pay

## 2020-07-24 ENCOUNTER — Encounter (HOSPITAL_BASED_OUTPATIENT_CLINIC_OR_DEPARTMENT_OTHER): Payer: Self-pay | Admitting: *Deleted

## 2020-07-24 ENCOUNTER — Emergency Department (HOSPITAL_BASED_OUTPATIENT_CLINIC_OR_DEPARTMENT_OTHER): Payer: Medicare PPO

## 2020-07-24 ENCOUNTER — Emergency Department (HOSPITAL_BASED_OUTPATIENT_CLINIC_OR_DEPARTMENT_OTHER)
Admission: EM | Admit: 2020-07-24 | Discharge: 2020-07-24 | Disposition: A | Discharge status: Home / Self Care | Payer: Medicare PPO | Attending: Emergency Medicine | Admitting: Emergency Medicine

## 2020-07-24 DIAGNOSIS — J45909 Unspecified asthma, uncomplicated: Secondary | ICD-10-CM | POA: Diagnosis not present

## 2020-07-24 DIAGNOSIS — E876 Hypokalemia: Secondary | ICD-10-CM | POA: Insufficient documentation

## 2020-07-24 DIAGNOSIS — Z8616 Personal history of COVID-19: Secondary | ICD-10-CM | POA: Insufficient documentation

## 2020-07-24 DIAGNOSIS — R202 Paresthesia of skin: Secondary | ICD-10-CM | POA: Insufficient documentation

## 2020-07-24 DIAGNOSIS — I10 Essential (primary) hypertension: Secondary | ICD-10-CM | POA: Diagnosis not present

## 2020-07-24 DIAGNOSIS — L539 Erythematous condition, unspecified: Secondary | ICD-10-CM | POA: Insufficient documentation

## 2020-07-24 DIAGNOSIS — Z79899 Other long term (current) drug therapy: Secondary | ICD-10-CM | POA: Insufficient documentation

## 2020-07-24 DIAGNOSIS — I2510 Atherosclerotic heart disease of native coronary artery without angina pectoris: Secondary | ICD-10-CM | POA: Diagnosis not present

## 2020-07-24 DIAGNOSIS — E039 Hypothyroidism, unspecified: Secondary | ICD-10-CM | POA: Insufficient documentation

## 2020-07-24 DIAGNOSIS — R21 Rash and other nonspecific skin eruption: Secondary | ICD-10-CM | POA: Diagnosis present

## 2020-07-24 LAB — CBC WITH DIFFERENTIAL/PLATELET
Abs Immature Granulocytes: 0.01 10*3/uL (ref 0.00–0.07)
Basophils Absolute: 0 10*3/uL (ref 0.0–0.1)
Basophils Relative: 1 %
Eosinophils Absolute: 0.1 10*3/uL (ref 0.0–0.5)
Eosinophils Relative: 2 %
HCT: 42.1 % (ref 36.0–46.0)
Hemoglobin: 13.8 g/dL (ref 12.0–15.0)
Immature Granulocytes: 0 %
Lymphocytes Relative: 34 %
Lymphs Abs: 2.3 10*3/uL (ref 0.7–4.0)
MCH: 33.4 pg (ref 26.0–34.0)
MCHC: 32.8 g/dL (ref 30.0–36.0)
MCV: 101.9 fL — ABNORMAL HIGH (ref 80.0–100.0)
Monocytes Absolute: 0.6 10*3/uL (ref 0.1–1.0)
Monocytes Relative: 8 %
Neutro Abs: 3.7 10*3/uL (ref 1.7–7.7)
Neutrophils Relative %: 55 %
Platelets: 322 10*3/uL (ref 150–400)
RBC: 4.13 MIL/uL (ref 3.87–5.11)
RDW: 13.8 % (ref 11.5–15.5)
WBC: 6.7 10*3/uL (ref 4.0–10.5)
nRBC: 0 % (ref 0.0–0.2)

## 2020-07-24 LAB — COMPREHENSIVE METABOLIC PANEL
ALT: 10 U/L (ref 0–44)
AST: 16 U/L (ref 15–41)
Albumin: 3 g/dL — ABNORMAL LOW (ref 3.5–5.0)
Alkaline Phosphatase: 64 U/L (ref 38–126)
Anion gap: 9 (ref 5–15)
BUN: 10 mg/dL (ref 8–23)
CO2: 23 mmol/L (ref 22–32)
Calcium: 8.6 mg/dL — ABNORMAL LOW (ref 8.9–10.3)
Chloride: 108 mmol/L (ref 98–111)
Creatinine, Ser: 0.7 mg/dL (ref 0.44–1.00)
GFR, Estimated: >60 mL/min (ref >60)
Glucose, Bld: 100 mg/dL — ABNORMAL HIGH (ref 70–99)
Potassium: 2.7 mmol/L — CL (ref 3.5–5.1)
Sodium: 140 mmol/L (ref 135–145)
Total Bilirubin: 0.3 mg/dL (ref 0.3–1.2)
Total Protein: 6.1 g/dL — ABNORMAL LOW (ref 6.5–8.1)

## 2020-07-24 LAB — VITAMIN B12: Vitamin B-12: 790 pg/mL (ref 180–914)

## 2020-07-24 LAB — MAGNESIUM: Magnesium: 2.1 mg/dL (ref 1.7–2.4)

## 2020-07-24 LAB — POTASSIUM: Potassium: 3.1 mmol/L — ABNORMAL LOW (ref 3.5–5.1)

## 2020-07-24 MED ORDER — POTASSIUM CHLORIDE CRYS ER 20 MEQ PO TBCR
40.0000 meq | EXTENDED_RELEASE_TABLET | Freq: Once | ORAL | Status: AC
  Administered 2020-07-24: 40 meq via ORAL
  Filled 2020-07-24: qty 2

## 2020-07-24 MED ORDER — POTASSIUM CHLORIDE 10 MEQ/100ML IV SOLN
10.0000 meq | INTRAVENOUS | Status: AC
Start: 2020-07-24 — End: 2020-07-24
  Administered 2020-07-24 (×3): 10 meq via INTRAVENOUS
  Filled 2020-07-24 (×3): qty 100

## 2020-07-24 MED ORDER — POTASSIUM CHLORIDE 10 MEQ/100ML IV SOLN
10.0000 meq | Freq: Once | INTRAVENOUS | Status: AC
  Administered 2020-07-24: 10 meq via INTRAVENOUS
  Filled 2020-07-24: qty 100

## 2020-07-24 NOTE — ED Provider Notes (Signed)
Patient checked out by Dr. Joya Gaskins at 4 PM.  Initial plan was to admit patient for hypokalemia however after speaking with Dr. Lorin Mercy she recommended doing 40 mEq orally and then doing 40 mg IV and rechecking the potassium.  If the potassium was greater than 3 she recommend doing a final 40 mg orally and allowing the patient to go home and following up with PCP for repeat check.  Patient has now completed all runs of potassium.  Repeat check shows a potassium of 3.1.  Will give an oral dose of 40 mg and then have patient follow closely with her PCP later this week for recheck.  She also will need outpatient dermatology follow-up.   Blanchie Dessert, MD 07/24/20 2032

## 2020-07-24 NOTE — Discharge Instructions (Addendum)
Your potassium levels are now rising.  Do not take the Lasix at this time.

## 2020-07-24 NOTE — ED Provider Notes (Signed)
[facility] EMERGENCY DEPARTMENT Provider Note   CSN: [id number] Arrival date & time: 07/24/20  1240     History Chief Complaint  Patient presents with  . Rash    Kayla [name] is a 75 y.o. female.  About 6 weeks ago, the patient developed a rash of her bilateral lower legs.  Her legs were bright red, and she had a blister on the dorsum of her left foot.  She has been seen several times for this complaint and has been on cephalexin followed by doxycycline.  Her skin is peeling, and she is having intermittent swelling of her ankles.  However, she has also developed painful legs.  She states the pain is burning, and she cannot stand for anything to touch her legs.  The history is provided by the patient.  Neurologic Problem This is a new problem. The current episode started more than 1 week ago. The problem occurs constantly. The problem has been gradually worsening. Pertinent negatives include no chest pain, no abdominal pain, no headaches and no shortness of breath. Exacerbated by: things touching her legs. Nothing relieves the symptoms. Treatments tried: cephalexin, doxycycline. The treatment provided mild relief.       Past Medical History:  Diagnosis Date  . Anemia   . Anxiety   . Asthma   . B12 deficiency   . Bursitis   . Chronic LBP   . Chronic UTI   . Diastolic dysfunction   . Fibromyalgia   . GERD (gastroesophageal reflux disease)   . Hyperlipemia   . Hypertension   . Hypothyroid   . IBS (irritable bowel syndrome)   . Inflammatory osteoarthritis   . Kidney infection   . Psoriatic arthritis ([place])   . S/P cardiac cath 11/30/2007   normal coronaries - Dr. Burt Knack (Dr. Debara Pickett reviewed films on 01/10/2016)    Patient Active Problem List   Diagnosis Date Noted  . Body mass index (BMI) 33.0-33.9, adult 06/20/2020  . Status post lumbar microdiscectomy 06/20/2020  . Essential (primary) hypertension 06/20/2020  . Bilateral lower extremity edema 06/17/2020  .  Cellulitis of leg, left 06/17/2020  . Cellulitis of leg, right 06/17/2020  . Lumbar herniated disc 05/25/2020  . Hyponatremia 05/12/2020  . Sciatica 05/12/2020  . COVID-19 05/09/2020  . Hypokalemia 05/04/2020  . Dysphagia 04/17/2020  . Renal insufficiency 12/29/2018  . Cyst of left ovary 12/29/2018  . Esophageal dysmotility 12/29/2018  . 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 Acquired hypothyroidism 12/29/2018  . Chronic maxillary sinusitis 07/07/2018  . Snoring 06/01/2018  . Costochondritis 06/01/2018  . Family history of heart disease 06/01/2018  . Class 2 severe obesity due to excess calories with serious comorbidity and body mass index (BMI) of 38.0 to 38.9 in adult ([place]) 06/01/2018  . Multiple atypical skin moles 04/28/2017  . Depression, recurrent ([place]) 07/16/2016  . Adjustment disorder with mixed anxiety and depressed mood 01/15/2016  . Fe Def Anemia 12/22/2015  . Scoliosis (and kyphoscoliosis), idiopathic 12/20/2015  . Osteopenia 12/20/2015  . Fibromyalgia 08/02/2014  . GAD (generalized anxiety disorder) 09/17/2012  . Vitamin D deficiency 01/24/2012  . Fatigue 10/14/2011  . B12 deficiency   . Hyperlipidemia 11/30/2007  . Coronary artery disease involving native heart without angina pectoris 11/30/2007  . Essential hypertension 01/27/2007  . Reactive airway disease that is not asthma 01/27/2007  . GERD 01/27/2007  . IBS 01/27/2007    Past Surgical History:  Procedure Laterality Date  . CESAREAN SECTION  06/10/1975  .  CHOLECYSTECTOMY    . LUMBAR LAMINECTOMY/DECOMPRESSION MICRODISCECTOMY Right 05/25/2020   Procedure: MICRODISCECTOMY Right Lumbar three - four;  Surgeon: Newman Pies, MD;  Location: [facility];  Service: Neurosurgery;  Laterality: Right;  . NECK SURGERY  1998  . RADIOLOGY WITH ANESTHESIA N/A 05/16/2020   Procedure: MRI LUMBAR SPINE W/CONTRAST;  Surgeon: Radiologist, Medication, MD;  Location: [facility];  Service: Radiology;  Laterality:  N/A;  . repair broken C6 & C7       OB History   No obstetric history on file.     Family History  Problem Relation Age of Onset  . Heart attack Father 48  . Lung cancer Other        uncle, non smoker  . Leukemia Other        uncle  . Cancer Mother        oral  . Stroke Mother   . Alzheimer's disease Mother        at 67  . Cancer Maternal Aunt        breast  . Alcohol abuse Maternal Aunt   . Alcohol abuse Maternal Uncle   . Cancer Maternal Uncle        lung, stomach, oral  . Cancer Paternal Uncle        lung  . Congestive Heart Failure Maternal Grandfather   . Heart disease Maternal Grandfather   . Heart attack Paternal Grandfather   . Cancer Paternal Uncle        bone  . Brain cancer Maternal Grandmother   . Alzheimer's disease Other        maternal great grandmother   . Colon cancer Neg Hx   . Breast cancer Neg Hx   . Diabetes Neg Hx     Social History   Tobacco Use  . Smoking status: Never Smoker  . Smokeless tobacco: Never Used  Vaping Use  . Vaping Use: Never used  Substance Use Topics  . Alcohol use: No  . Drug use: No    Home Medications Prior to Admission medications   Medication Sig Start Date End Date Taking? Authorizing Provider  acetaminophen (TYLENOL) 500 MG tablet Take 1,000 mg by mouth in the morning, at noon, in the evening, and at bedtime.    [provider]  Ascorbic Acid (VITAMIN C) 1000 MG tablet Take 500 mg by mouth daily.    [provider]  Calcium-Magnesium-Zinc (CAL-MAG-ZINC PO) Take 1 tablet by mouth 2 (two) times daily.    [provider]  cephALEXin (KEFLEX) 500 MG capsule Take 1 capsule (500 mg total) by mouth 2 (two) times daily for 7 days. 07/19/20 07/26/20  Chase Picket, MD  Cholecalciferol (VITAMIN D3) 25 MCG (1000 UT) CAPS Take 1,000 Units by mouth daily.    [provider]  clotrimazole (LOTRIMIN) 1 % cream Apply to affected area 2 times daily 03/16/20   Hall-Potvin, [place], PA-C   cyclobenzaprine (FLEXERIL) 10 MG tablet Take 1 tablet (10 mg total) by mouth 3 (three) times daily as needed for muscle spasms. 05/30/20   Antonieta Pert, MD  diclofenac Sodium (VOLTAREN) 1 % GEL Apply 2 g topically 4 (four) times daily. 07/12/20   Lamptey, Myrene Galas, MD  doxycycline (VIBRA-TABS) 100 MG tablet Take 1 tablet (100 mg total) by mouth 2 (two) times daily. 06/28/20   Camillia Herter, NP  ferrous sulfate 325 (65 FE) MG tablet Take 325 mg by mouth 2 (two) times daily with a meal.  [provider]  fluconazole (DIFLUCAN) 200 MG tablet Take 1 tablet (200 mg total) by mouth daily. 07/12/20   Nicolette Bang, DO  fluticasone (FLONASE) 50 MCG/ACT nasal spray Place 1 spray into both nostrils daily. 04/22/20   Hall-Potvin, [place], PA-C  furosemide (LASIX) 20 MG tablet Take 1 tablet (20 mg total) by mouth daily for 5 days. 07/12/20 07/17/20  Chase Picket, MD  levothyroxine (SYNTHROID) 125 MCG tablet Take 1 tablet (125 mcg total) by mouth daily. 05/04/20 07/03/20  Mayers, Cari S, PA-C  miconazole (MICOTIN) 2 % powder Apply topically as needed for itching. 06/09/20   Horton, Alvin Critchley, DO  nystatin (MYCOSTATIN/NYSTOP) powder Apply 1 application topically 3 (three) times daily. 02/17/20   Nicolette Bang, DO  oxyCODONE-acetaminophen (PERCOCET/ROXICET) 5-325 MG tablet Take 1 tablet by mouth 4 (four) times daily as needed. 06/07/20   [provider]  polymixin-bacitracin (POLYSPORIN) 500-10000 UNIT/GM OINT ointment Apply 1 application topically 2 (two) times daily. 07/12/20   LampteyMyrene Galas, MD  pyridOXINE (VITAMIN B-6) 100 MG tablet Take 100 mg by mouth daily.    [provider]  triamcinolone (KENALOG) 0.1 % Apply 1 application topically 2 (two) times daily. 03/16/20   Hall-Potvin, [place], PA-C  zinc gluconate 50 MG tablet Take 50 mg by mouth daily.    [provider]  amLODipine (NORVASC) 5 MG tablet Take 1 tablet (5 mg total) by mouth daily. Patient  not taking: Reported on 06/21/2020 05/30/20 07/12/20  Antonieta Pert, MD  cetirizine (ZYRTEC ALLERGY) 10 MG tablet Take 1 tablet (10 mg total) by mouth daily. Patient not taking: No sig reported 04/22/20 07/12/20  Hall-Potvin, [place], PA-C    Allergies    Ace inhibitors and Lisinopril  Review of Systems   Review of Systems  Constitutional: Negative for chills and fever.  HENT: Negative for ear pain and sore throat.   Eyes: Negative for pain and visual disturbance.  Respiratory: Negative for cough and shortness of breath.   Cardiovascular: Negative for chest pain and palpitations.  Gastrointestinal: Negative for abdominal pain and vomiting.  Genitourinary: Negative for dysuria and hematuria.  Musculoskeletal: Negative for arthralgias and back pain.  Skin: Positive for rash. Negative for color change.       She has also had some rashes pop up sporadically on the other skin of her body aside from the affected limbs  Neurological: Negative for seizures, syncope and headaches.  All other systems reviewed and are negative.   Physical Exam Updated Vital Signs BP (!) 156/109 (BP Location: Left Arm)   Pulse (!) 104   Temp 98.4 F (36.9 C) (Oral)   Resp 18   Ht 5\' 4"  (1.626 m)   Wt 87.1 kg   SpO2 99%   BMI 32.96 kg/m   Physical Exam Vitals and nursing note reviewed.  Constitutional:      General: She is not in acute distress.    Appearance: She is well-developed.  HENT:     Head: Normocephalic and atraumatic.  Eyes:     Conjunctiva/sclera: Conjunctivae normal.  Pulmonary:     Effort: Pulmonary effort is normal. No respiratory distress.  Musculoskeletal:        General: No deformity or signs of injury.     Cervical back: Normal range of motion.     Right lower leg: No edema.     Left lower leg: No edema.     Comments: Mild midline lumbar spine tenderness. The patient has a surgical pen  marking on her proximal lower legs bilaterally that reveals the extent of the former erythema.   Skin color is relatively normal, and her redness has obviously receded quite a bit.  She does have scaling and peeling of the plantar aspect of both feet, and on the dorsum of the left foot, there is evidence of a circular lesion, and the patient states that this was a former blister.  It is currently flat and slightly hyperpigmented.  There is another similar area on the anterior aspect of the left lower leg at the distal third of the tibial surface.  Dorsalis pedis and posterior tibialis pulses are within normal limits.  Skin:    General: Skin is warm and dry.     Capillary Refill: Capillary refill takes less than 2 seconds.     Findings: Rash present.     Comments: The patient has an erythematous maculopapular rash at the right flank.  She also has minimal erythema to her skin folds without frank rash.  Neurological:     General: No focal deficit present.     Mental Status: She is alert.     Sensory: No sensory deficit.     Motor: No weakness.  Psychiatric:        Mood and Affect: Mood normal.         ED Results / Procedures / Treatments   Labs (all labs ordered are listed, but only abnormal results are displayed) Labs Reviewed  COMPREHENSIVE METABOLIC PANEL - Abnormal; Notable for the following components:      Result Value   Potassium 2.7 (*)    Glucose, Bld 100 (*)    Calcium 8.6 (*)    Total Protein 6.1 (*)    Albumin 3.0 (*)    All other components within normal limits  CBC WITH DIFFERENTIAL/PLATELET - Abnormal; Notable for the following components:   MCV 101.9 (*)    All other components within normal limits  POTASSIUM - Abnormal; Notable for the following components:   Potassium 3.1 (*)    All other components within normal limits  VITAMIN B12  MAGNESIUM    EKG EKG Interpretation  Date/Time:  Monday July 24 2020 15:26:52 EDT Ventricular Rate:  76 PR Interval:  169 QRS Duration: 98 QT Interval:  392 QTC Calculation: 441 R Axis:   28 Text  Interpretation: Age not entered, assumed to be  75 years old for purpose of ECG interpretation Sinus rhythm normal axis QTc normal Confirmed by Lorre Munroe ([id number]) on 07/24/2020 3:34:49 PM   Radiology DG Lumbar Spine Complete  Result Date: 07/24/2020 CLINICAL DATA:  Low back pain radiating to the right buttock and groin. Bilateral leg tingling. EXAM: LUMBAR SPINE - COMPLETE 4+ VIEW COMPARISON:  Lumbar spine x-rays dated May 25, 2020. MRI lumbar spine dated May 16, 2020. FINDINGS: Five lumbar type vertebral bodies. No acute fracture or subluxation. Vertebral body heights are preserved. Unchanged mild anterolisthesis at L4-L5 and L5-S1. Unchanged moderate disc height loss from L2-L3 through L5-S1. Unchanged advanced facet arthropathy at L4-L5 and L5-S1. Prior cholecystectomy. IMPRESSION: 1. No acute osseous abnormality. 2. Unchanged moderate multilevel lumbar spondylosis as described above, with facet mediated grade 1 anterolisthesis at L4-L5 and L5-S1. Electronically Signed   By: Titus Dubin M.D.   On: 07/24/2020 14:34    Procedures Procedures   Medications Ordered in ED Medications  potassium chloride 10 mEq in 100 mL IVPB (has no administration in time range)  potassium chloride SA (KLOR-CON) CR tablet 40 mEq (  has no administration in time range)    ED Course  I have reviewed the triage vital signs and the nursing notes.  Pertinent labs & imaging results that were available during my care of the patient were reviewed by me and considered in my medical decision making (see chart for details).  Clinical Course as of 07/25/20 0911  Mon Jul 24, 2020  1522 I spoke with Dr. Lorin Mercy with the hospitalist service.  She recommends ongoing potassium infusions and a recheck of her potassium.  If the patient's potassium is above 3, she can go home with another dose of 40 mill equivalents of oral potassium and close PCP follow-up. [AW]    Clinical Course User Index [AW] Arnaldo Natal, MD    MDM Rules/Calculators/A&P                          [name] presents with pain and paresthesias of both legs.  She has been dealing with some skin issues for approximately 6 weeks and has been treated for cellulitis.  I think her symptoms are almost consistent with some sort of peripheral neuropathy.  Less likely would be a primary spinal cord issue such as transverse myelitis.  Alternatively, the same etiology of her skin redness could be responsible for her burning pain.  In the process of evaluating her for metabolic causes of paresthesias, I found that she was quite hypokalemic.  She was placed on several days of Lasix recently for her lower extremity swelling.  This is likely the cause of her hypokalemia, and she is not taking it anymore.  No ongoing fluid losses.  However, the potassium is so low that she will likely require further monitoring to ensure it is increasing.  After speaking with the hospitalist, the patient will be kept here for observation.  I did speak to the patient about dermatology and possibly even neurology follow-up. Final Clinical Impression(s) / ED Diagnoses Final diagnoses:  Hypokalemia  Paresthesia    Rx / DC Orders ED Discharge Orders    None       Arnaldo Natal, MD 07/25/20 [phone]

## 2020-07-24 NOTE — ED Notes (Signed)
Patient educated on discharge paperwork. Pt verbalizes understanding. Pt ambulatory with steady gait with family member. NAD. Vitals taken. A&Ox4. Respirations regular/unlabored. PIV removed.

## 2020-07-24 NOTE — ED Triage Notes (Signed)
She was treated for cellulitis of her lower legs since March 4th. Here today with skin issues on both of her legs.

## 2020-07-24 NOTE — ED Notes (Signed)
Assumed care of this patient. Vitals taken. A&Ox4. Respirations regular/unlabored. Connected to cardiac monitor, BP and pulse ox. Stretcher low, wheels locked, call bell within reach.

## 2020-07-26 ENCOUNTER — Telehealth: Payer: Self-pay | Admitting: Internal Medicine

## 2020-07-26 NOTE — Telephone Encounter (Signed)
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 Pt states she has to have it checked by Friday. I informed pt that her provider would have to the request lab order. So I scheduled her an appt for May 4th @1 :30. Please follow up with pt.

## 2020-07-27 ENCOUNTER — Other Ambulatory Visit: Payer: Self-pay

## 2020-07-27 ENCOUNTER — Other Ambulatory Visit: Payer: Self-pay | Admitting: Internal Medicine

## 2020-07-27 ENCOUNTER — Ambulatory Visit
Admission: EM | Admit: 2020-07-27 | Discharge: 2020-07-27 | Disposition: A | Discharge status: Home / Self Care | Payer: Medicare PPO

## 2020-07-27 DIAGNOSIS — M79671 Pain in right foot: Secondary | ICD-10-CM

## 2020-07-27 DIAGNOSIS — E876 Hypokalemia: Secondary | ICD-10-CM

## 2020-07-27 DIAGNOSIS — B372 Candidiasis of skin and nail: Secondary | ICD-10-CM | POA: Diagnosis not present

## 2020-07-27 DIAGNOSIS — M79605 Pain in left leg: Secondary | ICD-10-CM | POA: Diagnosis not present

## 2020-07-27 DIAGNOSIS — M79672 Pain in left foot: Secondary | ICD-10-CM

## 2020-07-27 DIAGNOSIS — M79604 Pain in right leg: Secondary | ICD-10-CM

## 2020-07-27 HISTORY — DX: Personal history of other endocrine, nutritional and metabolic disease: Z86.39

## 2020-07-27 LAB — BASIC METABOLIC PANEL
BUN/Creatinine Ratio: 11 — ABNORMAL LOW (ref 12–28)
BUN: 9 mg/dL (ref 8–27)
CO2: 23 mmol/L (ref 20–29)
Calcium: 9.2 mg/dL (ref 8.7–10.3)
Chloride: 110 mmol/L — ABNORMAL HIGH (ref 96–106)
Creatinine, Ser: 0.8 mg/dL (ref 0.57–1.00)
Glucose: 102 mg/dL — ABNORMAL HIGH (ref 65–99)
Potassium: 3.6 mmol/L (ref 3.5–5.2)
Sodium: 140 mmol/L (ref 134–144)
eGFR: 77 mL/min/{1.73_m2} (ref >59)

## 2020-07-27 MED ORDER — NYSTATIN-TRIAMCINOLONE 100000-0.1 UNIT/GM-% EX CREA: TOPICAL_CREAM | CUTANEOUS | 0 refills | Status: DC

## 2020-07-27 MED ORDER — NYSTATIN 100000 UNIT/GM EX POWD: 1.0000 "application " | Freq: Three times a day (TID) | CUTANEOUS | 0 refills | Status: DC

## 2020-07-27 MED ORDER — TIZANIDINE HCL 2 MG PO TABS: 2.0000 mg | ORAL_TABLET | Freq: Four times a day (QID) | ORAL | 0 refills | Status: DC | PRN

## 2020-07-27 NOTE — ED Provider Notes (Signed)
[name]    CSN: [id number] Arrival date & time: 07/27/20  1415      History   Chief Complaint Chief Complaint  Patient presents with  . Bilateral Lower Leg Redness/Pain  . Blood work needed    HPI Kayla [name] is a 75 y.o. female history of GERD, renal insufficiency, hypertension, lower leg cellulitis presenting today for follow-up of potassium, reevaluation of lower leg pain and yeast infection.  Patient recently was seen in the emergency room on 4/18 and had critically low potassium of 2.7, was given potassium drips and improved to 3.1.  Recommended to have potassium recheck.  Unable to get into PCP for this.  Patient has been dealing with bilateral leg cellulitis over the past 1 to 2 months, completed a course of doxycycline and cellulitis has improved.  She expresses concern today as she noticed some increased redness especially to her left foot as well as has had significant sensitivity and discomfort in her lower legs since.  She is concerned on if this is related to prior cellulitis or if there is another problem.  She denies history of any circulation problems.  Has plans to see wound clinic on 5/2.  Also reporting yeast infection underneath breast, abdominal folds and on the back.  Significant itching.  History of similar, but lost her medicines. HPI  Past Medical History:  Diagnosis Date  . Anemia   . Anxiety   . Asthma   . B12 deficiency   . Bursitis   . Chronic LBP   . Chronic UTI   . Diastolic dysfunction   . Fibromyalgia   . GERD (gastroesophageal reflux disease)   . History of low potassium   . Hyperlipemia   . Hypertension   . Hypothyroid   . IBS (irritable bowel syndrome)   . Inflammatory osteoarthritis   . Kidney infection   . Psoriatic arthritis ([place])   . S/P cardiac cath 11/30/2007   normal coronaries - Dr. Burt Knack (Dr. Debara Pickett reviewed films on 01/10/2016)    Patient Active Problem List   Diagnosis Date Noted  . Body mass index (BMI)  33.0-33.9, adult 06/20/2020  . Status post lumbar microdiscectomy 06/20/2020  . Essential (primary) hypertension 06/20/2020  . Bilateral lower extremity edema 06/17/2020  . Cellulitis of leg, left 06/17/2020  . Cellulitis of leg, right 06/17/2020  . Lumbar herniated disc 05/25/2020  . Hyponatremia 05/12/2020  . Sciatica 05/12/2020  . COVID-19 05/09/2020  . Hypokalemia 05/04/2020  . Dysphagia 04/17/2020  . Renal insufficiency 12/29/2018  . Cyst of left ovary 12/29/2018  . Esophageal dysmotility 12/29/2018  . 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 Acquired hypothyroidism 12/29/2018  . Chronic maxillary sinusitis 07/07/2018  . Snoring 06/01/2018  . Costochondritis 06/01/2018  . Family history of heart disease 06/01/2018  . Class 2 severe obesity due to excess calories with serious comorbidity and body mass index (BMI) of 38.0 to 38.9 in adult ([place]) 06/01/2018  . Multiple atypical skin moles 04/28/2017  . Depression, recurrent ([place]) 07/16/2016  . Adjustment disorder with mixed anxiety and depressed mood 01/15/2016  . Fe Def Anemia 12/22/2015  . Scoliosis (and kyphoscoliosis), idiopathic 12/20/2015  . Osteopenia 12/20/2015  . Fibromyalgia 08/02/2014  . GAD (generalized anxiety disorder) 09/17/2012  . Vitamin D deficiency 01/24/2012  . Fatigue 10/14/2011  . B12 deficiency   . Hyperlipidemia 11/30/2007  . Coronary artery disease involving native heart without angina pectoris 11/30/2007  . Essential hypertension 01/27/2007  . Reactive airway disease  that is not asthma 01/27/2007  . GERD 01/27/2007  . IBS 01/27/2007    Past Surgical History:  Procedure Laterality Date  . CESAREAN SECTION  06/10/1975  . CHOLECYSTECTOMY    . LUMBAR LAMINECTOMY/DECOMPRESSION MICRODISCECTOMY Right 05/25/2020   Procedure: MICRODISCECTOMY Right Lumbar three - four;  Surgeon: Newman Pies, MD;  Location: [place] [facility];  Service: Neurosurgery;  Laterality: Right;  . NECK SURGERY  1998   . RADIOLOGY WITH ANESTHESIA N/A 05/16/2020   Procedure: MRI LUMBAR SPINE W/CONTRAST;  Surgeon: Radiologist, Medication, MD;  Location: [facility];  Service: Radiology;  Laterality: N/A;  . repair broken C6 & C7      OB History   No obstetric history on file.      Home Medications    Prior to Admission medications   Medication Sig Start Date End Date Taking? Authorizing Provider  acetaminophen (TYLENOL) 500 MG tablet Take 1,000 mg by mouth in the morning, at noon, in the evening, and at bedtime.   Yes [provider]  ferrous sulfate 325 (65 FE) MG tablet Take 325 mg by mouth 2 (two) times daily with a meal.   Yes [provider]  levothyroxine (SYNTHROID) 125 MCG tablet Take 1 tablet (125 mcg total) by mouth daily. 05/04/20 07/03/20 Yes Mayers, Cari S, PA-C  nystatin (MYCOSTATIN/NYSTOP) powder Apply 1 application topically 3 (three) times daily. 07/27/20  Yes Nera Haworth, [place] C, PA-C  nystatin-triamcinolone (MYCOLOG II) cream Apply to affected area twice daily 07/27/20  Yes Shakina Choy C, PA-C  tiZANidine (ZANAFLEX) 2 MG tablet Take 1-2 tablets (2-4 mg total) by mouth every 6 (six) hours as needed for muscle spasms. 07/27/20  Yes Braedon Sjogren C, PA-C  vitamin B-12 (CYANOCOBALAMIN) 500 MCG tablet Take 500 mcg by mouth daily.   Yes [provider]  Ascorbic Acid (VITAMIN C) 1000 MG tablet Take 500 mg by mouth daily.    [provider]  Calcium-Magnesium-Zinc (CAL-MAG-ZINC PO) Take 1 tablet by mouth 2 (two) times daily.    [provider]  chlorthalidone (HYGROTON) 25 MG tablet Take 25 mg by mouth daily. 06/07/20   [provider]  Cholecalciferol (VITAMIN D3) 25 MCG (1000 UT) CAPS Take 1,000 Units by mouth daily.    [provider]  fluticasone (FLONASE) 50 MCG/ACT nasal spray Place 1 spray into both nostrils daily. 04/22/20   Hall-Potvin, [place], PA-C  furosemide (LASIX) 20 MG tablet Take 1 tablet (20 mg total) by mouth daily for 5  days. 07/12/20 07/17/20  LampteyMyrene Galas, MD  polymixin-bacitracin (POLYSPORIN) 500-10000 UNIT/GM OINT ointment Apply 1 application topically 2 (two) times daily. 07/12/20   LampteyMyrene Galas, MD  pyridOXINE (VITAMIN B-6) 100 MG tablet Take 100 mg by mouth daily.    [provider]  triamcinolone (KENALOG) 0.1 % Apply 1 application topically 2 (two) times daily. 03/16/20   Hall-Potvin, [place], PA-C  zinc gluconate 50 MG tablet Take 50 mg by mouth daily.    [provider]  amLODipine (NORVASC) 5 MG tablet Take 1 tablet (5 mg total) by mouth daily. Patient not taking: Reported on 06/21/2020 05/30/20 07/12/20  Antonieta Pert, MD  cetirizine (ZYRTEC ALLERGY) 10 MG tablet Take 1 tablet (10 mg total) by mouth daily. Patient not taking: No sig reported 04/22/20 07/12/20  Hall-Potvin, [place], PA-C    Family History Family History  Problem Relation Age of Onset  . Heart attack Father 76  . Lung cancer Other        uncle, non smoker  .  Leukemia Other        uncle  . Cancer Mother        oral  . Stroke Mother   . Alzheimer's disease Mother        at 54  . Cancer Maternal Aunt        breast  . Alcohol abuse Maternal Aunt   . Alcohol abuse Maternal Uncle   . Cancer Maternal Uncle        lung, stomach, oral  . Cancer Paternal Uncle        lung  . Congestive Heart Failure Maternal Grandfather   . Heart disease Maternal Grandfather   . Heart attack Paternal Grandfather   . Cancer Paternal Uncle        bone  . Brain cancer Maternal Grandmother   . Alzheimer's disease Other        maternal great grandmother   . Colon cancer Neg Hx   . Breast cancer Neg Hx   . Diabetes Neg Hx     Social History Social History   Tobacco Use  . Smoking status: Never Smoker  . Smokeless tobacco: Never Used  Vaping Use  . Vaping Use: Never used  Substance Use Topics  . Alcohol use: No  . Drug use: No     Allergies   Ace inhibitors and Lisinopril   Review of Systems Review of Systems   Constitutional: Negative for fatigue and fever.  HENT: Negative for congestion, mouth sores, sinus pressure and sore throat.   Eyes: Negative for photophobia, pain and visual disturbance.  Respiratory: Negative for cough and shortness of breath.   Cardiovascular: Negative for chest pain.  Gastrointestinal: Negative for abdominal pain, nausea and vomiting.  Genitourinary: Negative for decreased urine volume, genital sores and hematuria.  Musculoskeletal: Positive for myalgias. Negative for arthralgias, joint swelling, neck pain and neck stiffness.  Skin: Positive for color change and rash. Negative for wound.  Neurological: Negative for dizziness, syncope, facial asymmetry, speech difficulty, weakness, light-headedness, numbness and headaches.     Physical Exam Triage Vital Signs ED Triage Vitals  Enc Vitals Group     BP 07/27/20 1504 (!) 154/90     Pulse Rate 07/27/20 1504 79     Resp 07/27/20 1504 18     Temp 07/27/20 1504 98.9 F (37.2 C)     Temp src --      SpO2 07/27/20 1504 97 %     Weight --      Height --      Head Circumference --      Peak Flow --      Pain Score 07/27/20 1447 8     Pain Loc --      Pain Edu? --      Excl. in [place]? --    No data found.  Updated Vital Signs BP (!) 154/90   Pulse 79   Temp 98.9 F (37.2 C)   Resp 18   SpO2 97%   Visual Acuity Right Eye Distance:   Left Eye Distance:   Bilateral Distance:    Right Eye Near:   Left Eye Near:    Bilateral Near:     Physical Exam Vitals and nursing note reviewed.  Constitutional:      Appearance: She is well-developed.     Comments: No acute distress  HENT:     Head: Normocephalic and atraumatic.     Nose: Nose normal.  Eyes:     Conjunctiva/sclera: Conjunctivae normal.  Cardiovascular:     Rate and Rhythm: Normal rate.  Pulmonary:     Effort: Pulmonary effort is normal. No respiratory distress.  Abdominal:     General: There is no distension.  Musculoskeletal:        General:  Normal range of motion.     Cervical back: Neck supple.     Comments: Bilateral lower legs with chronic skin changes noted, left foot with increased purple discoloration compared to right, cap refill brisk, dorsalis pedis 1+ on left, 2+ right, left foot slightly cooler to touch compared to right, sensitivity and mild tenderness to palpation throughout bilateral anterior lower legs and dorsum of feet, no calf tenderness  Skin:    General: Skin is warm and dry.     Comments: Right mid/lower back with area of erythema, slightly maculopapular  Neurological:     Mental Status: She is alert and oriented to person, place, and time.      UC Treatments / Results  Labs (all labs ordered are listed, but only abnormal results are displayed) Labs Reviewed  BASIC METABOLIC PANEL    EKG   Radiology No results found.  Procedures Procedures (including critical care time)  Medications Ordered in UC Medications - No data to display  Initial Impression / Assessment and Plan / UC Course  I have reviewed the triage vital signs and the nursing notes.  Pertinent labs & imaging results that were available during my care of the patient were reviewed by me and considered in my medical decision making (see chart for details).     1.  Hypokalemia follow-up-rechecking potassium today, BNP sent off stat, will call with results of abnormal  2.  Lower leg pain/discoloration-left leg slightly more purple discoloration and coolness compared to right, does not appear concerning for continued cellulitis/infection at this time, concerning for possible circulation/vascular problem instead.  Placing referral to vascular.  Providing tizanidine to help with lower leg pain/cramping in the meantime.  3.  Yeast infection- providing nystatin powder to use in folds for yeast dermatitis, nystatin/triamcinolone for area noted on back.  Follow-up if any symptoms not improving or worsening Final Clinical Impressions(s) /  UC Diagnoses   Final diagnoses:  Hypokalemia  Bilateral pain of leg and foot  Yeast dermatitis     Discharge Instructions     Referral to vascular placed-they should contact you Use nystatin/triamcinolone cream to area on back Nystatin powder to area under folds for yeast Tizanidine to help with leg pain/cramping Continue to elevate legs Follow-up with wound clinic as planned If pain progressing or worsening, developing increased discoloration to foot please go to the emergency room    ED Prescriptions    Medication Sig Dispense Auth. Provider   nystatin-triamcinolone (MYCOLOG II) cream Apply to affected area twice daily 30 g Gwendelyn Lanting C, PA-C   nystatin (MYCOSTATIN/NYSTOP) powder Apply 1 application topically 3 (three) times daily. 30 g Garcia Dalzell C, PA-C   tiZANidine (ZANAFLEX) 2 MG tablet Take 1-2 tablets (2-4 mg total) by mouth every 6 (six) hours as needed for muscle spasms. 30 tablet Mertie Haslem, [place] C, PA-C     PDMP not reviewed this encounter.   Janith Lima, [place] 07/27/20 1636

## 2020-07-27 NOTE — Telephone Encounter (Signed)
Patient was seen in the ED on  Was given several runs of K+ Does not look like she was prescribed any po to take home.   Please advise and place orders for lab test if patient should return to office on Friday. Has scheduled an apt for May 4th.

## 2020-07-27 NOTE — Telephone Encounter (Signed)
Have placed lab orders for patient. She can return tomorrow for lab work and will determine if she needs PO medications. If labs are normal, we may be able to cancel appointment 5/4 unless she has other concerns.   Phill Myron, D.O. Primary Care at [facility]  07/27/2020, 6:42 PM

## 2020-07-27 NOTE — Discharge Instructions (Signed)
Referral to vascular placed-they should contact you Use nystatin/triamcinolone cream to area on back Nystatin powder to area under folds for yeast Tizanidine to help with leg pain/cramping Continue to elevate legs Follow-up with wound clinic as planned If pain progressing or worsening, developing increased discoloration to foot please go to the emergency room

## 2020-07-27 NOTE — ED Triage Notes (Addendum)
Pt previously diagnosed with cellulitis of bilateral lower legs. Was since told the cellulitis was healed but pt concerned with continuing redness and pain, with bilateral knee pain. Some swelling noted to feet/ankles/right calf.  Pt also reports needing bloodwork. States her potassium had been very low and was placed on a potassium drip. Pt states she needs her potassium rechecked.   Pt states she was told not to take potassium supplements in the past due to kidney sepsis history. She states she had been put on furosemide in the past due to swelling.  Pt also reports issues with yeast in her folds.

## 2020-07-28 NOTE — Telephone Encounter (Signed)
Patient name and DOB verified  States she went to UC yesterday and BMP was done. Potassium resulted at 3.6.  The UC also referred her to vascular for further evaluation of her feet. Given phone number to [facility]- [phone]  She states she would like to keep the apt in May with Dr. Juleen China to discuss the GYN issue, biopsy or ablation procedure she discussed with her at a previous OV.  ______________________________________  Results given for labs that we were were not able to reach patient about. From 06/21/20:   Please call [name] about urine results. Culture showed that she has a UTI with yeast. Needs to take Fluconazole for 14 days to treat this. There were no RBCs/blood in urine this time which is good.

## 2020-08-05 ENCOUNTER — Other Ambulatory Visit: Payer: Self-pay | Admitting: Physician Assistant

## 2020-08-05 DIAGNOSIS — E039 Hypothyroidism, unspecified: Secondary | ICD-10-CM

## 2020-08-07 ENCOUNTER — Encounter (HOSPITAL_BASED_OUTPATIENT_CLINIC_OR_DEPARTMENT_OTHER): Payer: Medicare PPO | Attending: Internal Medicine | Admitting: Internal Medicine

## 2020-08-07 ENCOUNTER — Other Ambulatory Visit: Payer: Self-pay

## 2020-08-07 DIAGNOSIS — I878 Other specified disorders of veins: Secondary | ICD-10-CM | POA: Insufficient documentation

## 2020-08-07 DIAGNOSIS — I10 Essential (primary) hypertension: Secondary | ICD-10-CM | POA: Insufficient documentation

## 2020-08-07 DIAGNOSIS — I872 Venous insufficiency (chronic) (peripheral): Secondary | ICD-10-CM | POA: Diagnosis not present

## 2020-08-07 DIAGNOSIS — J45909 Unspecified asthma, uncomplicated: Secondary | ICD-10-CM | POA: Diagnosis not present

## 2020-08-07 DIAGNOSIS — M797 Fibromyalgia: Secondary | ICD-10-CM | POA: Insufficient documentation

## 2020-08-07 DIAGNOSIS — Z872 Personal history of diseases of the skin and subcutaneous tissue: Secondary | ICD-10-CM | POA: Diagnosis not present

## 2020-08-08 NOTE — Progress Notes (Signed)
CARVER, NAPOLEONE ([id number]) Visit Report for 08/07/2020 Chief Complaint Document Details Patient Name: Date of Service: MAKYLA, OLALDE 08/07/2020 3:00 PM Medical Record Number: [id number] Patient Account Number: [id number] Date of Birth/Sex: Treating RN: 16-Jun-1945 (75 y.o. Elam Dutch Primary Care Provider: Phill Myron Other Clinician: Referring Provider: Treating Provider/Extender: Einar Pheasant in Treatment: 0 Information Obtained from: Patient Chief Complaint Bilateral lower extremity skin changes with a history of cellulitis and swelling. Electronic Signature(s) Signed: 08/07/2020 4:47:55 PM By: Kalman Shan DO Entered By: Kalman Shan on 08/07/2020 16:36:38 -------------------------------------------------------------------------------- HPI Details Patient Name: Date of Service: Tonye Royalty I. 08/07/2020 3:00 PM Medical Record Number: [id number] Patient Account Number: [id number] Date of Birth/Sex: Treating RN: [birth year] (75 y.o. Elam Dutch Primary Care Provider: Phill Myron Other Clinician: Referring Provider: Treating Provider/Extender: Einar Pheasant in Treatment: 0 History of Present Illness HPI Description: Ms. Sarajane Jews is a 75 year old female with a past medical history of essential hypertension that presents today for bilateral lower extremity Skin changes with a recent history of cellulitis and increased edema. For the past 2 months the patient has been seeking care for her bilateral lower extremity swelling and erythema. She had DVT studies that were negative. She was diagnosed with cellulitis and was treated with Keflex and doxycycline. She was also treated with Lasix to help with her swelling. She states that the antibiotics helped treat the cellulitis and she no longer had increased erythema or pain to her legs bilaterally. She has stopped taking Lasix. She reports that her symptoms occurred  spontaneously about 2 months ago. She has some skin changes to her legs that she is concerned about. Overall she feels well and she denies any symptoms of infection. Electronic Signature(s) Signed: 08/07/2020 4:47:55 PM By: Kalman Shan DO Entered By: Kalman Shan on 08/07/2020 16:39:09 -------------------------------------------------------------------------------- Physical Exam Details Patient Name: Date of Service: LUZ, REITMEIER 08/07/2020 3:00 PM Medical Record Number: [id number] Patient Account Number: [id number] Date of Birth/Sex: Treating RN: [birth year] (75 y.o. Elam Dutch Primary Care Provider: Other Clinician: Phill Myron Referring Provider: Treating Provider/Extender: Einar Pheasant in Treatment: 0 Constitutional respirations regular, non-labored and within target range for patient.Marland Kitchen Psychiatric pleasant and cooperative. Notes 2+ pedal pulse on the left. Faint pedal pulse on the right. Venous stasis changes bilaterally. 1+ pitting edema to the knees bilaterally. There are no signs of infection. No increased warmth or erythema to the legs bilaterally. There are no open wounds. There are different stages of healing present due to previous blistering when she had swelling and cellulitis. Everything is epithelialized however. Electronic Signature(s) Signed: 08/07/2020 4:47:55 PM By: Kalman Shan DO Entered By: Kalman Shan on 08/07/2020 16:40:54 -------------------------------------------------------------------------------- Physician Orders Details Patient Name: Date of Service: Tonye Royalty I. 08/07/2020 3:00 PM Medical Record Number: [id number] Patient Account Number: [id number] Date of Birth/Sex: Treating RN: 1946-04-07 (75 y.o. Martyn Malay, Linda Primary Care Provider: Phill Myron Other Clinician: Referring Provider: Treating Provider/Extender: Einar Pheasant in Treatment: 0 Verbal / Phone  Orders: No Diagnosis Coding ICD-10 Coding Code Description I87.2 Venous insufficiency (chronic) (peripheral) Z87.2 Personal history of diseases of the skin and subcutaneous tissue I10 Essential (primary) hypertension Discharge From [facility] Services Discharge from [place] Edema Control - Lymphedema / SCD / Other Bilateral Lower Extremities Elevate legs to the level of the heart or above for 30 minutes daily and/or when sitting, a frequency of: - throughout the day Avoid standing for long periods of time.  Exercise regularly Moisturize legs daily. Non Wound Condition pply the following to affected area as directed: - may use triamcinolone cream to discolored area on left foot daily A Electronic Signature(s) Signed: 08/07/2020 4:47:55 PM By: Kalman Shan DO Entered By: Kalman Shan on 08/07/2020 16:41:14 -------------------------------------------------------------------------------- Problem List Details Patient Name: Date of Service: Tonye Royalty I. 08/07/2020 3:00 PM Medical Record Number: [id number] Patient Account Number: [id number] Date of Birth/Sex: Treating RN: Jan 28, 1946 (75 y.o. Elam Dutch Primary Care Provider: Phill Myron Other Clinician: Referring Provider: Treating Provider/Extender: Einar Pheasant in Treatment: 0 Active Problems ICD-10 Encounter Code Description Active Date MDM Diagnosis I87.2 Venous insufficiency (chronic) (peripheral) 08/07/2020 No Yes Z87.2 Personal history of diseases of the skin and subcutaneous tissue 08/07/2020 No Yes I10 Essential (primary) hypertension 08/07/2020 No Yes Inactive Problems Resolved Problems Electronic Signature(s) Signed: 08/07/2020 4:47:55 PM By: Kalman Shan DO Entered By: Kalman Shan on 08/07/2020 16:36:05 -------------------------------------------------------------------------------- Progress Note Details Patient Name: Date of Service: Tonye Royalty I. 08/07/2020 3:00  PM Medical Record Number: [id number] Patient Account Number: [id number] Date of Birth/Sex: Treating RN: 1945/10/23 (75 y.o. Elam Dutch Primary Care Provider: Phill Myron Other Clinician: Referring Provider: Treating Provider/Extender: Einar Pheasant in Treatment: 0 Subjective Chief Complaint Information obtained from Patient Bilateral lower extremity skin changes with a history of cellulitis and swelling. History of Present Illness (HPI) Ms. Sarajane Jews is a 75 year old female with a past medical history of essential hypertension that presents today for bilateral lower extremity Skin changes with a recent history of cellulitis and increased edema. For the past 2 months the patient has been seeking care for her bilateral lower extremity swelling and erythema. She had DVT studies that were negative. She was diagnosed with cellulitis and was treated with Keflex and doxycycline. She was also treated with Lasix to help with her swelling. She states that the antibiotics helped treat the cellulitis and she no longer had increased erythema or pain to her legs bilaterally. She has stopped taking Lasix. She reports that her symptoms occurred spontaneously about 2 months ago. She has some skin changes to her legs that she is concerned about. Overall she feels well and she denies any symptoms of infection. Patient History Information obtained from Patient. Allergies ACE Inhibitors Family History Cancer - Mother,Maternal Grandparents, Heart Disease - Father,Maternal Grandparents,Paternal Grandparents, Stroke - Mother, No family history of Diabetes, Hereditary Spherocytosis, Hypertension, Kidney Disease, Lung Disease, Seizures, Thyroid Problems, Tuberculosis. Social History Never smoker, Marital Status - Widowed, Alcohol Use - Never, Drug Use - No History, Caffeine Use - Rarely. Medical History Eyes Denies history of Cataracts, Glaucoma, Optic  Neuritis Ear/Nose/Mouth/Throat Denies history of Chronic sinus problems/congestion, Middle ear problems Hematologic/Lymphatic Patient has history of Anemia Respiratory Patient has history of Asthma Cardiovascular Patient has history of Hypertension, Peripheral Venous Disease Endocrine Denies history of Type I Diabetes, Type II Diabetes Genitourinary Denies history of End Stage Renal Disease Integumentary (Skin) Denies history of History of Burn Musculoskeletal Patient has history of Osteoarthritis Oncologic Denies history of Received Chemotherapy, Received Radiation Psychiatric Patient has history of Confinement Anxiety Denies history of Anorexia/bulimia Hospitalization/Surgery History - c-section. - cholecystectomy. - lumbar laminectomy/decompression. - neck surgery. - repair broken C6andC7. Medical A Surgical History Notes nd Constitutional Symptoms (General Health) obesity, vitamin D deficiency Ear/Nose/Mouth/Throat hard of hearing Cardiovascular hyperlipidemia Gastrointestinal GERD, dysphagia, esophageal dysmobility, IBS Endocrine hypothyroid Genitourinary renal insufficiency Musculoskeletal psoriatic arthritis, chronic back pain, sciliosis, lumbar herniated disc Neurologic fibromyalgia Review of Systems (ROS) Eyes Complains or  has symptoms of Glasses / Contacts - reading. Denies complaints or symptoms of Dry Eyes, Vision Changes. Ear/Nose/Mouth/Throat Denies complaints or symptoms of Chronic sinus problems or rhinitis. Respiratory Denies complaints or symptoms of Chronic or frequent coughs, Shortness of Breath. Cardiovascular Denies complaints or symptoms of Chest pain. Endocrine Denies complaints or symptoms of Heat/cold intolerance. Genitourinary Denies complaints or symptoms of Frequent urination. Integumentary (Skin) Denies complaints or symptoms of Wounds. Musculoskeletal Denies complaints or symptoms of Muscle Pain, Muscle  Weakness. Psychiatric Complains or has symptoms of Claustrophobia. Denies complaints or symptoms of Suicidal. Objective Constitutional respirations regular, non-labored and within target range for patient.. Vitals Time Taken: 3:40 PM, Height: 64 in, Source: Stated, Weight: 190 lbs, Source: Stated, BMI: 32.6, Temperature: 98.7 F, Pulse: 77 bpm, Respiratory Rate: 18 breaths/min, Blood Pressure: 162/98 mmHg. Psychiatric pleasant and cooperative. General Notes: 2+ pedal pulse on the left. Faint pedal pulse on the right. Venous stasis changes bilaterally. 1+ pitting edema to the knees bilaterally. There are no signs of infection. No increased warmth or erythema to the legs bilaterally. There are no open wounds. There are different stages of healing present due to previous blistering when she had swelling and cellulitis. Everything is epithelialized however. Assessment Active Problems ICD-10 Venous insufficiency (chronic) (peripheral) Personal history of diseases of the skin and subcutaneous tissue Essential (primary) hypertension Patient had cellulitis about 2 months ago and has been successfully treated. There are no open wounds today. I can see where she had previous blistering due to skin changes. I recommended she lotion her legs. She states she has an appointment with vascular specialist at the end of the month due to her faint pedal pulse that was referred from the urgent care. If Blood flow is adequate I told her she would benefit from compression stockings. She can follow-up as needed. Plan Discharge From [facility] Services: Discharge from [place] Edema Control - Lymphedema / SCD / Other: Elevate legs to the level of the heart or above for 30 minutes daily and/or when sitting, a frequency of: - throughout the day Avoid standing for long periods of time. Exercise regularly Moisturize legs daily. Non Wound Condition: Apply the following to affected area as directed: - may use  triamcinolone cream to discolored area on left foot daily 1. Follow-up as needed Electronic Signature(s) Signed: 08/07/2020 4:47:55 PM By: Kalman Shan DO Entered By: Kalman Shan on 08/07/2020 16:46:12 -------------------------------------------------------------------------------- HxROS Details Patient Name: Date of Service: Tonye Royalty I. 08/07/2020 3:00 PM Medical Record Number: [id number] Patient Account Number: [id number] Date of Birth/Sex: Treating RN: June 06, 1945 (75 y.o. Elam Dutch Primary Care Provider: Phill Myron Other Clinician: Referring Provider: Treating Provider/Extender: Einar Pheasant in Treatment: 0 Information Obtained From Patient Eyes Complaints and Symptoms: Positive for: Glasses / Contacts - reading Negative for: Dry Eyes; Vision Changes Medical History: Negative for: Cataracts; Glaucoma; Optic Neuritis Ear/Nose/Mouth/Throat Complaints and Symptoms: Negative for: Chronic sinus problems or rhinitis Medical History: Negative for: Chronic sinus problems/congestion; Middle ear problems Past Medical History Notes: hard of hearing Respiratory Complaints and Symptoms: Negative for: Chronic or frequent coughs; Shortness of Breath Medical History: Positive for: Asthma Cardiovascular Complaints and Symptoms: Negative for: Chest pain Medical History: Positive for: Hypertension; Peripheral Venous Disease Past Medical History Notes: hyperlipidemia Endocrine Complaints and Symptoms: Negative for: Heat/cold intolerance Medical History: Negative for: Type I Diabetes; Type II Diabetes Past Medical History Notes: hypothyroid Genitourinary Complaints and Symptoms: Negative for: Frequent urination Medical History: Negative for: End Stage Renal Disease Past  Medical History Notes: renal insufficiency Integumentary (Skin) Complaints and Symptoms: Negative for: Wounds Medical History: Negative for: History of  Burn Musculoskeletal Complaints and Symptoms: Negative for: Muscle Pain; Muscle Weakness Medical History: Positive for: Osteoarthritis Past Medical History Notes: psoriatic arthritis, chronic back pain, sciliosis, lumbar herniated disc Psychiatric Complaints and Symptoms: Positive for: Claustrophobia Negative for: Suicidal Medical History: Positive for: Confinement Anxiety Negative for: Anorexia/bulimia Constitutional Symptoms (General Health) Medical History: Past Medical History Notes: obesity, vitamin D deficiency Hematologic/Lymphatic Medical History: Positive for: Anemia Gastrointestinal Medical History: Past Medical History Notes: GERD, dysphagia, esophageal dysmobility, IBS Immunological Neurologic Medical History: Past Medical History Notes: fibromyalgia Oncologic Medical History: Negative for: Received Chemotherapy; Received Radiation Immunizations Pneumococcal Vaccine: Received Pneumococcal Vaccination: Yes Implantable Devices None Hospitalization / Surgery History Type of Hospitalization/Surgery c-section cholecystectomy lumbar laminectomy/decompression neck surgery repair broken C6andC7 Family and Social History Cancer: Yes - Mother,Maternal Grandparents; Diabetes: No; Heart Disease: Yes - Father,Maternal Grandparents,Paternal Grandparents; Hereditary Spherocytosis: No; Hypertension: No; Kidney Disease: No; Lung Disease: No; Seizures: No; Stroke: Yes - Mother; Thyroid Problems: No; Tuberculosis: No; Never smoker; Marital Status - Widowed; Alcohol Use: Never; Drug Use: No History; Caffeine Use: Rarely; Financial Concerns: No; Food, Clothing or Shelter Needs: No; Support System Lacking: No; Transportation Concerns: No Electronic Signature(s) Signed: 08/07/2020 4:47:55 PM By: Kalman Shan DO Signed: 08/08/2020 5:08:22 PM By: Baruch Gouty RN, BSN Entered By: Baruch Gouty on 08/07/2020  15:59:33 -------------------------------------------------------------------------------- [place] Details Patient Name: Date of Service: [name] 08/07/2020 Medical Record Number: [id number] Patient Account Number: [id number] Date of Birth/Sex: Treating RN: 1946/04/01 (75 y.o. Elam Dutch Primary Care Provider: Phill Myron Other Clinician: Referring Provider: Treating Provider/Extender: Einar Pheasant in Treatment: 0 Diagnosis Coding ICD-10 Codes Code Description I87.2 Venous insufficiency (chronic) (peripheral) Z87.2 Personal history of diseases of the skin and subcutaneous tissue I10 Essential (primary) hypertension Facility Procedures Physician Procedures : CPT4 Code Description Modifier [id number] [place] PHYS LEVEL 3 NEW PT ICD-10 Diagnosis Description I87.2 Venous insufficiency (chronic) (peripheral) Z87.2 Personal history of diseases of the skin and subcutaneous tissue I10 Essential (primary) hypertension Quantity: 1 Electronic Signature(s) Signed: 08/07/2020 4:47:55 PM By: Kalman Shan DO Entered By: Kalman Shan on 08/07/2020 16:46:50

## 2020-08-08 NOTE — Progress Notes (Signed)
Kayla [name], Kayla [name] ([id number]) Visit Report for 08/07/2020 Abuse/Suicide Risk Screen Details Patient Name: Date of Service: Kayla [name], Kayla [name] 08/07/2020 3:00 PM Medical Record Number: [id number] Patient Account Number: [id number] Date of Birth/Sex: Treating RN: 1945-10-20 (75 y.o. Elam Dutch Primary Care Samarra Ridgely: Phill Myron Other Clinician: Referring Baruc Tugwell: Treating Thais Silberstein/Extender: Einar Pheasant in Treatment: 0 Abuse/Suicide Risk Screen Items Answer ABUSE RISK SCREEN: Has anyone close to you tried to hurt or harm you recentlyo No Do you feel uncomfortable with anyone in your familyo No Has anyone forced you do things that you didnt want to doo No Electronic Signature(s) Signed: 08/08/2020 5:08:22 PM By: Baruch Gouty RN, BSN Entered By: Baruch Gouty on 08/07/2020 15:59:53 -------------------------------------------------------------------------------- Activities of Daily Living Details Patient Name: Date of Service: Kayla [name], Kayla [name] 08/07/2020 3:00 PM Medical Record Number: [id number] Patient Account Number: [id number] Date of Birth/Sex: Treating RN: 1945-09-02 (75 y.o. Elam Dutch Primary Care Om Liz[place]: Phill Myron Other Clinician: Referring Shaniece Bussa: Treating Hardy Harcum/Extender: Einar Pheasant in Treatment: 0 Activities of Daily Living Items Answer Activities of Daily Living (Please select one for each item) Drive Automobile Completely Able T Medications ake Completely Able Use T elephone Completely Able Care for Appearance Completely Able Use T oilet Completely Able Bath / Shower Completely Able Dress Self Completely Able Feed Self Completely Able Walk Completely Able Get In / Out Bed Completely Able Housework Completely Able Prepare Meals Completely Able Handle Money Completely Able Shop for Self Completely Able Electronic Signature(s) Signed: 08/08/2020 5:08:22 PM By: Baruch Gouty RN,  BSN Entered By: Baruch Gouty on 08/07/2020 16:00:31 -------------------------------------------------------------------------------- Education Screening Details Patient Name: Date of Service: Kayla Royalty I. 08/07/2020 3:00 PM Medical Record Number: [id number] Patient Account Number: [id number] Date of Birth/Sex: Treating RN: 1945/07/20 (75 y.o. Elam Dutch Primary Care Vonceil Upshur: Phill Myron Other Clinician: Referring Shanedra Lave: Treating Nilani Hugill/Extender: Einar Pheasant in Treatment: 0 Primary Learner Assessed: Patient Learning Preferences/Education Level/Primary Language Learning Preference: Explanation, Demonstration, Printed Material Highest Education Level: College or Above Preferred Language: English Cognitive Barrier Language Barrier: No Translator Needed: No Memory Deficit: No Emotional Barrier: No Cultural/Religious Beliefs Affecting Medical Care: No Physical Barrier Impaired Vision: No Impaired Hearing: Yes hard of hearing Decreased Hand dexterity: No Knowledge/Comprehension Knowledge Level: High Comprehension Level: High Ability to understand written instructions: High Ability to understand verbal instructions: High Motivation Anxiety Level: Calm Cooperation: Cooperative Education Importance: Acknowledges Need Interest in Health Problems: Asks Questions Perception: Coherent Willingness to Engage in Self-Management High Activities: Readiness to Engage in Self-Management High Activities: Electronic Signature(s) Signed: 08/08/2020 5:08:22 PM By: Baruch Gouty RN, BSN Entered By: Baruch Gouty on 08/07/2020 16:01:36 -------------------------------------------------------------------------------- Fall Risk Assessment Details Patient Name: Date of Service: Kayla Royalty I. 08/07/2020 3:00 PM Medical Record Number: [id number] Patient Account Number: [id number] Date of Birth/Sex: Treating RN: 02/22/1946 (75 y.o. Elam Dutch Primary Care Yanci Bachtell: Phill Myron Other Clinician: Referring Hiroki Wint: Treating Aimy Sweeting/Extender: Einar Pheasant in Treatment: 0 Fall Risk Assessment Items Have you had 2 or more falls in the last 12 monthso 0 No Have you had any fall that resulted in injury in the last 12 monthso 0 No FALLS RISK SCREEN History of falling - immediate or within 3 months 25 Yes Secondary diagnosis (Do you have 2 or more medical diagnoseso) 0 No Ambulatory aid None/bed rest/wheelchair/nurse 0 Yes Crutches/cane/walker 0 No Furniture 0 No Intravenous therapy Access/Saline/Heparin Lock 0 No Gait/Transferring Normal/ bed rest/ wheelchair 0 Yes Weak (short steps with  or without shuffle, stooped but able to lift head while walking, may seek 0 No support from furniture) Impaired (short steps with shuffle, may have difficulty arising from chair, head down, impaired 0 No balance) Mental Status Oriented to own ability 0 Yes Electronic Signature(s) Signed: 08/08/2020 5:08:22 PM By: Baruch Gouty RN, BSN Entered By: Baruch Gouty on 08/07/2020 16:01:59 -------------------------------------------------------------------------------- Foot Assessment Details Patient Name: Date of Service: Kayla Royalty I. 08/07/2020 3:00 PM Medical Record Number: [id number] Patient Account Number: [id number] Date of Birth/Sex: Treating RN: 14-Mar-1946 (75 y.o. Elam Dutch Primary Care Joice Nazario: Phill Myron Other Clinician: Referring Mayo Faulk: Treating Sueellen Kayes/Extender: Einar Pheasant in Treatment: 0 Foot Assessment Items Site Locations + = Sensation present, - = Sensation absent, C = Callus, U = Ulcer R = Redness, W = Warmth, M = Maceration, PU = Pre-ulcerative lesion F = Fissure, S = Swelling, D = Dryness Assessment Right: Left: Other Deformity: No No Prior Foot Ulcer: No No Prior Amputation: No No Charcot Joint: No No Ambulatory  Status: Ambulatory Without Help Gait: Steady Electronic Signature(s) Signed: 08/08/2020 5:08:22 PM By: Baruch Gouty RN, BSN Entered By: Baruch Gouty on 08/07/2020 16:02:48 -------------------------------------------------------------------------------- Nutrition Risk Screening Details Patient Name: Date of Service: Kayla Royalty I. 08/07/2020 3:00 PM Medical Record Number: [id number] Patient Account Number: [id number] Date of Birth/Sex: Treating RN: 1945-08-10 (76 y.o. Elam Dutch Primary Care Kelyn Ponciano: Phill Myron Other Clinician: Referring Javaun Dimperio: Treating Ashyr Hedgepath/Extender: Einar Pheasant in Treatment: 0 Height (in): 64 Weight (lbs): 190 Body Mass Index (BMI): 32.6 Nutrition Risk Screening Items Score Screening NUTRITION RISK SCREEN: I have an illness or condition that made me change the kind and/or amount of food I eat 0 No I eat fewer than two meals per day 0 No I eat few fruits and vegetables, or milk products 0 No I have three or more drinks of beer, liquor or wine almost every day 0 No I have tooth or mouth problems that make it hard for me to eat 2 Yes I don't always have enough money to buy the food I need 0 No I eat alone most of the time 0 No I take three or more different prescribed or over-the-counter drugs a day 1 Yes Without wanting to, I have lost or gained 10 pounds in the last six months 2 Yes I am not always physically able to shop, cook and/or feed myself 0 No Nutrition Protocols Good Risk Protocol Moderate Risk Protocol 0 Provide education on nutrition High Risk Proctocol Risk Level: Moderate Risk Score: 5 Electronic Signature(s) Signed: 08/08/2020 5:08:22 PM By: Baruch Gouty RN, BSN Entered By: Baruch Gouty on 08/07/2020 16:02:34

## 2020-08-08 NOTE — Progress Notes (Signed)
Kayla [name], Kayla [name] ([id number]) Visit Report for 08/07/2020 Allergy List Details Patient Name: Date of Service: Kayla [name], Kayla [name] 08/07/2020 3:00 PM Medical Record Number: [id number] Patient Account Number: [id number] Date of Birth/Sex: Treating RN: 1945/04/26 (75 y.o. Elam Dutch Primary Care Jaicob Dia: Phill Myron Other Clinician: Referring Savalas Monje: Treating Osker Ayoub/Extender: Einar Pheasant in Treatment: 0 Allergies Active Allergies ACE Inhibitors Allergy Notes Electronic Signature(s) Signed: 08/08/2020 5:08:22 PM By: Baruch Gouty RN, BSN Entered By: Baruch Gouty on 08/07/2020 15:42:50 -------------------------------------------------------------------------------- Arrival Information Details Patient Name: Date of Service: Kayla Royalty I. 08/07/2020 3:00 PM Medical Record Number: [id number] Patient Account Number: [id number] Date of Birth/Sex: Treating RN: [birth year] (75 y.o. Elam Dutch Primary Care Annye Forrey: Phill Myron Other Clinician: Referring Tennille Montelongo: Treating Shatha Hooser/Extender: Einar Pheasant in Treatment: 0 Visit Information Patient Arrived: Ambulatory Arrival Time: 15:28 Accompanied By: self Transfer Assistance: None Patient Identification Verified: Yes Secondary Verification Process Completed: Yes Patient Requires Transmission-Based Precautions: No Patient Has Alerts: No Electronic Signature(s) Signed: 08/08/2020 5:08:22 PM By: Baruch Gouty RN, BSN Entered By: Baruch Gouty on 08/07/2020 15:40:13 -------------------------------------------------------------------------------- Clinic Level of Care Assessment Details Patient Name: Date of Service: Kayla [name], Kayla [name] 08/07/2020 3:00 PM Medical Record Number: [id number] Patient Account Number: [id number] Date of Birth/Sex: Treating RN: [birth year] (75 y.o. Elam Dutch Primary Care Patt Steinhardt: Phill Myron Other Clinician: Referring  Indy Prestwood: Treating Kymir Coles/Extender: Einar Pheasant in Treatment: 0 Clinic Level of Care Assessment Items TOOL 2 Quantity Score []  - 0 Use when only an EandM is performed on the INITIAL visit ASSESSMENTS - Nursing Assessment / Reassessment X- 1 20 General Physical Exam (combine w/ comprehensive assessment (listed just below) when performed on new pt. evals) X- 1 25 Comprehensive Assessment (HX, ROS, Risk Assessments, Wounds Hx, etc.) ASSESSMENTS - Wound and Skin A ssessment / Reassessment []  - 0 Simple Wound Assessment / Reassessment - one wound []  - 0 Complex Wound Assessment / Reassessment - multiple wounds X- 1 10 Dermatologic / Skin Assessment (not related to wound area) ASSESSMENTS - Ostomy and/or Continence Assessment and Care []  - 0 Incontinence Assessment and Management []  - 0 Ostomy Care Assessment and Management (repouching, etc.) PROCESS - Coordination of Care X - Simple Patient / Family Education for ongoing care 1 15 []  - 0 Complex (extensive) Patient / Family Education for ongoing care X- 1 10 Staff obtains Programmer, systems, Records, T Results / Process Orders est []  - 0 Staff telephones HHA, Nursing Homes / Clarify orders / etc []  - 0 Routine Transfer to another Facility (non-emergent condition) []  - 0 Routine Hospital Admission (non-emergent condition) X- 1 15 New Admissions / Biomedical engineer / Ordering NPWT Apligraf, etc. , []  - 0 Emergency Hospital Admission (emergent condition) X- 1 10 Simple Discharge Coordination []  - 0 Complex (extensive) Discharge Coordination PROCESS - Special Needs []  - 0 Pediatric / Minor Patient Management []  - 0 Isolation Patient Management []  - 0 Hearing / Language / Visual special needs []  - 0 Assessment of Community assistance (transportation, D/C planning, etc.) []  - 0 Additional assistance / Altered mentation []  - 0 Support Surface(s) Assessment (bed, cushion, seat,  etc.) INTERVENTIONS - Wound Cleansing / Measurement []  - 0 Wound Imaging (photographs - any number of wounds) []  - 0 Wound Tracing (instead of photographs) []  - 0 Simple Wound Measurement - one wound []  - 0 Complex Wound Measurement - multiple wounds []  - 0 Simple Wound Cleansing - one wound []  - 0 Complex Wound Cleansing - multiple  wounds INTERVENTIONS - Wound Dressings []  - 0 Small Wound Dressing one or multiple wounds []  - 0 Medium Wound Dressing one or multiple wounds []  - 0 Large Wound Dressing one or multiple wounds []  - 0 Application of Medications - injection INTERVENTIONS - Miscellaneous []  - 0 External ear exam []  - 0 Specimen Collection (cultures, biopsies, blood, body fluids, etc.) []  - 0 Specimen(s) / Culture(s) sent or taken to Lab for analysis []  - 0 Patient Transfer (multiple staff / Harrel Lemon Lift / Similar devices) []  - 0 Simple Staple / Suture removal (25 or less) []  - 0 Complex Staple / Suture removal (26 or more) []  - 0 Hypo / Hyperglycemic Management (close monitor of Blood Glucose) []  - 0 Ankle / Brachial Index (ABI) - do not check if billed separately Has the patient been seen at the hospital within the last three years: Yes Total Score: 105 Level Of Care: New/Established - Level 3 Electronic Signature(s) Signed: 08/08/2020 5:08:22 PM By: Baruch Gouty RN, BSN Entered By: Baruch Gouty on 08/07/2020 16:20:25 -------------------------------------------------------------------------------- Encounter Discharge Information Details Patient Name: Date of Service: Kayla Royalty I. 08/07/2020 3:00 PM Medical Record Number: [id number] Patient Account Number: [id number] Date of Birth/Sex: Treating RN: 07-23-1945 (75 y.o. Elam Dutch Primary Care Terrin Meddaugh: Phill Myron Other Clinician: Referring Jailene Cupit: Treating Lakeyta Vandenheuvel/Extender: Einar Pheasant in Treatment: 0 Encounter Discharge Information Items Discharge  Condition: Stable Ambulatory Status: Ambulatory Discharge Destination: Home Transportation: Private Auto Accompanied By: self Schedule Follow-up Appointment: Yes Clinical Summary of Care: Patient Declined Electronic Signature(s) Signed: 08/08/2020 5:08:22 PM By: Baruch Gouty RN, BSN Entered By: Baruch Gouty on 08/07/2020 16:32:23 -------------------------------------------------------------------------------- Lower Extremity Assessment Details Patient Name: Date of Service: Kayla Royalty I. 08/07/2020 3:00 PM Medical Record Number: [id number] Patient Account Number: [id number] Date of Birth/Sex: Treating RN: [birth year] (75 y.o. Elam Dutch Primary Care Cyprian Gongaware: Phill Myron Other Clinician: Referring Ahjanae Cassel: Treating Sutton Plake/Extender: Einar Pheasant in Treatment: 0 Edema Assessment Assessed: Shirlyn Goltz: No] [Right: No] Edema: [Left: Yes] [Right: Yes] Calf Left: Right: Point of Measurement: From Medial Instep 41.8 cm 46.5 cm Ankle Left: Right: Point of Measurement: From Medial Instep 26 cm 26.2 cm Vascular Assessment Pulses: Dorsalis Pedis Palpable: [Left:Yes] [Right:Yes] Electronic Signature(s) Signed: 08/08/2020 5:08:22 PM By: Baruch Gouty RN, BSN Entered By: Baruch Gouty on 08/07/2020 16:04:18 -------------------------------------------------------------------------------- Multi Wound Chart Details Patient Name: Date of Service: Kayla Royalty I. 08/07/2020 3:00 PM Medical Record Number: [id number] Patient Account Number: [id number] Date of Birth/Sex: Treating RN: Jan 28, 1946 (75 y.o. Elam Dutch Primary Care Ladasha Schnackenberg: Phill Myron Other Clinician: Referring Zeola Brys: Treating Marnita Poirier/Extender: Einar Pheasant in Treatment: 0 Vital Signs Height(in): 64 Pulse(bpm): 77 Weight(lbs): 190 Blood Pressure(mmHg): 162/98 Body Mass Index(BMI): 33 Temperature(F): 98.7 Respiratory  Rate(breaths/min): 18 Wound Assessments Treatment Notes Electronic Signature(s) Signed: 08/07/2020 4:47:55 PM By: Kalman Shan DO Signed: 08/08/2020 5:08:22 PM By: Baruch Gouty RN, BSN Entered By: Kalman Shan on 08/07/2020 16:36:09 -------------------------------------------------------------------------------- Pain Assessment Details Patient Name: Date of Service: Kayla Royalty I. 08/07/2020 3:00 PM Medical Record Number: [id number] Patient Account Number: [id number] Date of Birth/Sex: Treating RN: 09-11-1945 (76 y.o. Elam Dutch Primary Care Lyndsee Casa: Phill Myron Other Clinician: Referring Trevonne Nyland: Treating Mina Babula/Extender: Einar Pheasant in Treatment: 0 Active Problems Location of Pain Severity and Description of Pain Patient Has Paino Yes Site Locations Pain Location: Generalized Pain Duration of the Pain. Constant / Intermittento Intermittent Rate the pain. Current Pain Level: 3 Worst Pain Level: 10 Least Pain Level:  0 Character of Pain Describe the Pain: Sharp, Shooting Pain Management and Medication Current Pain Management: Medication: Yes Is the Current Pain Management Adequate: Adequate How does your wound impact your activities of daily livingo Sleep: Yes Bathing: No Appetite: No Relationship With Others: No Bladder Continence: No Emotions: Yes Bowel Continence: No Work: No Toileting: No Drive: No Dressing: No Hobbies: No Electronic Signature(s) Signed: 08/08/2020 5:08:22 PM By: Baruch Gouty RN, BSN Entered By: Baruch Gouty on 08/07/2020 16:05:40 -------------------------------------------------------------------------------- Patient/Caregiver Education Details Patient Name: Date of Service: Kayla [name], Kayla I. 5/2/2022andnbsp3:00 PM Medical Record Number: [id number] Patient Account Number: [id number] Date of Birth/Gender: Treating RN: [birth year] (75 y.o. Elam Dutch Primary Care Physician:  Phill Myron Other Clinician: Referring Physician: Treating Physician/Extender: Einar Pheasant in Treatment: 0 Education Assessment Education Provided To: Patient Education Topics Provided Venous: Handouts: Controlling Swelling with Compression Stockings Methods: Explain/Verbal, Printed Responses: Reinforcements needed, State content correctly Wound/Skin Impairment: Handouts: Caring for Your Ulcer, Skin Care Do's and Dont's Methods: Explain/Verbal, Printed Responses: Reinforcements needed, State content correctly Electronic Signature(s) Signed: 08/08/2020 5:08:22 PM By: Baruch Gouty RN, BSN Entered By: Baruch Gouty on 08/07/2020 16:19:02 -------------------------------------------------------------------------------- [place] Details Patient Name: Date of Service: Kayla Royalty I. 08/07/2020 3:00 PM Medical Record Number: [id number] Patient Account Number: [id number] Date of Birth/Sex: Treating RN: 1945/04/11 (75 y.o. Elam Dutch Primary Care Manika Hast: Phill Myron Other Clinician: Referring Doriana Mazurkiewicz: Treating Akaiya Touchette/Extender: Einar Pheasant in Treatment: 0 Vital Signs Time Taken: 15:40 Temperature (F): 98.7 Height (in): 64 Pulse (bpm): 77 Source: Stated Respiratory Rate (breaths/min): 18 Weight (lbs): 190 Blood Pressure (mmHg): 162/98 Source: Stated Reference Range: 80 - 120 mg / dl Body Mass Index (BMI): 32.6 Electronic Signature(s) Signed: 08/08/2020 5:08:22 PM By: Baruch Gouty RN, BSN Entered By: Baruch Gouty on 08/07/2020 15:41:10

## 2020-08-09 ENCOUNTER — Other Ambulatory Visit: Payer: Self-pay

## 2020-08-09 ENCOUNTER — Encounter: Payer: Self-pay | Admitting: Internal Medicine

## 2020-08-09 ENCOUNTER — Ambulatory Visit (INDEPENDENT_AMBULATORY_CARE_PROVIDER_SITE_OTHER): Payer: Medicare PPO | Admitting: Internal Medicine

## 2020-08-09 VITALS — BP 138/95 | HR 98 | Resp 16 | Wt 192.0 lb

## 2020-08-09 DIAGNOSIS — E039 Hypothyroidism, unspecified: Secondary | ICD-10-CM

## 2020-08-09 DIAGNOSIS — I872 Venous insufficiency (chronic) (peripheral): Secondary | ICD-10-CM

## 2020-08-09 DIAGNOSIS — R319 Hematuria, unspecified: Secondary | ICD-10-CM | POA: Diagnosis not present

## 2020-08-09 DIAGNOSIS — E876 Hypokalemia: Secondary | ICD-10-CM | POA: Diagnosis not present

## 2020-08-09 MED ORDER — LEVOTHYROXINE SODIUM 125 MCG PO TABS
125.0000 ug | ORAL_TABLET | Freq: Every day | ORAL | 1 refills | Status: DC
Start: 2020-08-09 — End: 2020-09-12

## 2020-08-09 NOTE — Progress Notes (Signed)
Subjective:    ROSALINDA [name] - 75 y.o. female MRN [id number]  Date of birth: 1945-09-21  HPI  Kayla [name] is here for follow up. Has had cellulitis of lower extremity, now followed by wound care. Concern for chronic venous insuffiencey. Will be establishing with vascular and having ABIs performed.      Health Maintenance:  Health Maintenance Due  Topic Date Due  . COVID-19 Vaccine (1) Never done  . DEXA SCAN  Never done  . COLONOSCOPY (Pts 45-74yrs Insurance coverage will need to be confirmed)  05/19/2012    -  reports that she has never smoked. She has never used smokeless tobacco. - Review of Systems: Per HPI. - Past Medical History: Patient Active Problem List   Diagnosis Date Noted  . Body mass index (BMI) 33.0-33.9, adult 06/20/2020  . Status post lumbar microdiscectomy 06/20/2020  . Essential (primary) hypertension 06/20/2020  . Bilateral lower extremity edema 06/17/2020  . Cellulitis of leg, left 06/17/2020  . Cellulitis of leg, right 06/17/2020  . Lumbar herniated disc 05/25/2020  . Hyponatremia 05/12/2020  . Sciatica 05/12/2020  . COVID-19 05/09/2020  . Hypokalemia 05/04/2020  . Dysphagia 04/17/2020  . Renal insufficiency 12/29/2018  . Cyst of left ovary 12/29/2018  . Esophageal dysmotility 12/29/2018  . 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 Acquired hypothyroidism 12/29/2018  . Chronic maxillary sinusitis 07/07/2018  . Snoring 06/01/2018  . Costochondritis 06/01/2018  . Family history of heart disease 06/01/2018  . Class 2 severe obesity due to excess calories with serious comorbidity and body mass index (BMI) of 38.0 to 38.9 in adult ([place]) 06/01/2018  . Multiple atypical skin moles 04/28/2017  . Depression, recurrent ([place]) 07/16/2016  . Adjustment disorder with mixed anxiety and depressed mood 01/15/2016  . Fe Def Anemia 12/22/2015  . Scoliosis (and kyphoscoliosis), idiopathic 12/20/2015  . Osteopenia 12/20/2015  . Fibromyalgia  08/02/2014  . GAD (generalized anxiety disorder) 09/17/2012  . Vitamin D deficiency 01/24/2012  . Fatigue 10/14/2011  . B12 deficiency   . Hyperlipidemia 11/30/2007  . Coronary artery disease involving native heart without angina pectoris 11/30/2007  . Essential hypertension 01/27/2007  . Reactive airway disease that is not asthma 01/27/2007  . GERD 01/27/2007  . IBS 01/27/2007   - Medications: reviewed and updated   Objective:   Physical Exam BP (!) 138/95   Pulse 98   Resp 16   Wt 192 lb (87.1 kg)   SpO2 96%   BMI 32.96 kg/m  Physical Exam Constitutional:      General: She is not in acute distress.    Appearance: She is not diaphoretic.  Cardiovascular:     Rate and Rhythm: Normal rate.  Pulmonary:     Effort: Pulmonary effort is normal. No respiratory distress.  Musculoskeletal:        General: Normal range of motion.  Skin:    General: Skin is warm and dry.     Comments: Venous stasis skin color changes with edema of LE present.   Neurological:     Mental Status: She is alert and oriented to person, place, and time.  Psychiatric:        Mood and Affect: Affect normal.        Judgment: Judgment normal.            Assessment & Plan:   1. Chronic venous insufficiency Establish with vascular.   2. Hypokalemia K 3.1 on 4/18 and 3/6 on 4/21. Monitor.  - Basic Metabolic  Panel  3. Hematuria, unspecified type Patient with prior urine culture with candida. She reports she never took prescribed antifungal. She is having urinary symptoms and would like to have urine culture repeated.  - Urinalysis, Routine w reflex microscopic - Urine Culture  4. Acquired hypothyroidism - levothyroxine (SYNTHROID) 125 MCG tablet; Take 1 tablet (125 mcg total) by mouth daily.  Dispense: 30 tablet; Refill: [place], D.O. 08/09/2020, 2:08 PM Primary Care at [facility]

## 2020-08-10 ENCOUNTER — Telehealth: Payer: Self-pay | Admitting: Internal Medicine

## 2020-08-10 LAB — BASIC METABOLIC PANEL
BUN/Creatinine Ratio: 9 — ABNORMAL LOW (ref 12–28)
BUN: 8 mg/dL (ref 8–27)
CO2: 22 mmol/L (ref 20–29)
Calcium: 9.1 mg/dL (ref 8.7–10.3)
Chloride: 108 mmol/L — ABNORMAL HIGH (ref 96–106)
Creatinine, Ser: 0.87 mg/dL (ref 0.57–1.00)
Glucose: 97 mg/dL (ref 65–99)
Potassium: 3.5 mmol/L (ref 3.5–5.2)
Sodium: 147 mmol/L — ABNORMAL HIGH (ref 134–144)
eGFR: 69 mL/min/{1.73_m2} (ref >59)

## 2020-08-10 LAB — URINALYSIS, ROUTINE W REFLEX MICROSCOPIC

## 2020-08-10 NOTE — Telephone Encounter (Signed)
Pt came in asking if tiZANidine (ZANAFLEX) 2 MG tablet  could be called in. Pt stated she has received this medication at Urgent Care.  Pharmacy  [id number] - [place], [place], [place] [address]  Phone:  [phone] Fax:  [phone]  DEA #:  [id number]

## 2020-08-11 ENCOUNTER — Other Ambulatory Visit: Payer: Self-pay | Admitting: *Deleted

## 2020-08-11 ENCOUNTER — Other Ambulatory Visit: Payer: Self-pay | Admitting: Internal Medicine

## 2020-08-11 DIAGNOSIS — R600 Localized edema: Secondary | ICD-10-CM

## 2020-08-11 MED ORDER — TIZANIDINE HCL 2 MG PO TABS: 2.0000 mg | ORAL_TABLET | Freq: Four times a day (QID) | ORAL | 0 refills | Status: DC | PRN

## 2020-08-11 NOTE — Telephone Encounter (Signed)
Refill sent.   Phill Myron, D.O. Primary Care at [facility]  08/11/2020, 9:50 AM

## 2020-08-13 LAB — URINE CULTURE

## 2020-08-15 ENCOUNTER — Other Ambulatory Visit: Payer: Self-pay | Admitting: Internal Medicine

## 2020-08-15 MED ORDER — CEPHALEXIN 500 MG PO CAPS: 500.0000 mg | ORAL_CAPSULE | Freq: Two times a day (BID) | ORAL | 0 refills | Status: DC

## 2020-08-24 ENCOUNTER — Ambulatory Visit: Payer: Medicare PPO | Admitting: Cardiology

## 2020-08-24 ENCOUNTER — Other Ambulatory Visit: Payer: Self-pay

## 2020-08-24 ENCOUNTER — Encounter: Payer: Self-pay | Admitting: Cardiology

## 2020-08-24 VITALS — BP 132/82 | HR 84 | Ht 64.0 in | Wt 190.0 lb

## 2020-08-24 DIAGNOSIS — R600 Localized edema: Secondary | ICD-10-CM | POA: Diagnosis not present

## 2020-08-24 DIAGNOSIS — I10 Essential (primary) hypertension: Secondary | ICD-10-CM | POA: Diagnosis not present

## 2020-08-24 DIAGNOSIS — Z8616 Personal history of COVID-19: Secondary | ICD-10-CM

## 2020-08-24 DIAGNOSIS — Z7189 Other specified counseling: Secondary | ICD-10-CM

## 2020-08-24 DIAGNOSIS — I2510 Atherosclerotic heart disease of native coronary artery without angina pectoris: Secondary | ICD-10-CM

## 2020-08-24 NOTE — Progress Notes (Signed)
Cardiology Office Note:    Date:  08/24/2020   ID:  Kayla [name], DOB 1946/01/17, MRN [id number]  PCP:  Nicolette Bang, DO  Cardiologist:  Buford Dresser, MD PhD  Referring MD: Caryl Never*   CC: follow up  History of Present Illness:    Kayla [name] is a 75 y.o. female with a hx of mild CAD, HTN, HLD, IBS, hypothyroid, GERD, fibromyalgia, OA who is seen for follow up today. I first met her on 06/01/18. Seen initially by Rosaria Ferries 03/26/18 for hypertension and chest discomfort.  Today: Notes a very difficult few months. Had covid infection, then had bilateral LE edema and cellulitis. Has residual discoloration and swelling. She is scheduled to see the vascular clinic next week. She feels like she has electric currents running through her bilateral LE. She has not been driving as this has caused limited flexibility. Typically she tries to sleep with her legs elevated.  Of note, she had a COVID infection since last visit. She lost 20 pounds from her hospital stay. Per chart review, she received monoclonal antibodies as an outpatient and was admitted for sciatic pain and subjective weakness from 05/11/20- 05/30/20 and underwent microdiscetomy for a herniated disc during that admission.  She denies any chest pain, shortness of breath, palpitations, or exertional symptoms. No headaches, lightheadedness, or syncope to report. Also has no orthopnea or PND.  She notes her food sometimes feels stuck in her throat, and believes she has an esophageal motility issue.  She currently lives with her son and grandchildren. She is a retired Education officer, museum.  Her father died suddenly of unknown causes at 75 yo, and her paternal grandfather also died suddenly of unknown causes.   She overall feels very overwhelmed given recent events and is tearful during our encounter.  Past Medical History:  Diagnosis Date  . Anemia   . Anxiety   . Asthma   . B12 deficiency   . Bursitis    . Chronic LBP   . Chronic UTI   . Diastolic dysfunction   . Fibromyalgia   . GERD (gastroesophageal reflux disease)   . History of low potassium   . Hyperlipemia   . Hypertension   . Hypothyroid   . IBS (irritable bowel syndrome)   . Inflammatory osteoarthritis   . Kidney infection   . Psoriatic arthritis ([place])   . S/P cardiac cath 11/30/2007   normal coronaries - Dr. Burt Knack (Dr. Debara Pickett reviewed films on 01/10/2016)    Past Surgical History:  Procedure Laterality Date  . CESAREAN SECTION  06/10/1975  . CHOLECYSTECTOMY    . LUMBAR LAMINECTOMY/DECOMPRESSION MICRODISCECTOMY Right 05/25/2020   Procedure: MICRODISCECTOMY Right Lumbar three - four;  Surgeon: Newman Pies, MD;  Location: [facility];  Service: Neurosurgery;  Laterality: Right;  . NECK SURGERY  1998  . RADIOLOGY WITH ANESTHESIA N/A 05/16/2020   Procedure: MRI LUMBAR SPINE W/CONTRAST;  Surgeon: Radiologist, Medication, MD;  Location: [facility];  Service: Radiology;  Laterality: N/A;  . repair broken C6 & C7      Current Medications: Current Outpatient Medications on File Prior to Visit  Medication Sig  . acetaminophen (TYLENOL) 500 MG tablet Take 1,000 mg by mouth in the morning, at noon, in the evening, and at bedtime.  . Ascorbic Acid (VITAMIN C) 1000 MG tablet Take 500 mg by mouth daily.  . Calcium-Magnesium-Zinc (CAL-MAG-ZINC PO) Take 1 tablet by mouth 2 (two) times daily.  . chlorthalidone (HYGROTON) 25 MG tablet Take 25  mg by mouth daily.  . Cholecalciferol (VITAMIN D3) 25 MCG (1000 UT) CAPS Take 1,000 Units by mouth daily.  . ferrous sulfate 325 (65 FE) MG tablet Take 325 mg by mouth 2 (two) times daily with a meal.  . levothyroxine (SYNTHROID) 125 MCG tablet Take 1 tablet (125 mcg total) by mouth daily.  Marland Kitchen nystatin (MYCOSTATIN/NYSTOP) powder Apply 1 application topically 3 (three) times daily.  Marland Kitchen nystatin-triamcinolone (MYCOLOG II) cream Apply to affected area twice daily  . polymixin-bacitracin (POLYSPORIN) 500-10000  UNIT/GM OINT ointment Apply 1 application topically 2 (two) times daily.  Marland Kitchen pyridOXINE (VITAMIN B-6) 100 MG tablet Take 100 mg by mouth daily.  Marland Kitchen tiZANidine (ZANAFLEX) 2 MG tablet Take 1-2 tablets (2-4 mg total) by mouth every 6 (six) hours as needed for muscle spasms.  Marland Kitchen triamcinolone (KENALOG) 0.1 % Apply 1 application topically 2 (two) times daily.  . vitamin B-12 (CYANOCOBALAMIN) 500 MCG tablet Take 500 mcg by mouth daily.  . [DISCONTINUED] amLODipine (NORVASC) 5 MG tablet Take 1 tablet (5 mg total) by mouth daily. (Patient not taking: Reported on 06/21/2020)  . [DISCONTINUED] cetirizine (ZYRTEC ALLERGY) 10 MG tablet Take 1 tablet (10 mg total) by mouth daily. (Patient not taking: No sig reported)   No current facility-administered medications on file prior to visit.     Allergies:   Ace inhibitors and Lisinopril   Social History   Tobacco Use  . Smoking status: Never Smoker  . Smokeless tobacco: Never Used  Vaping Use  . Vaping Use: Never used  Substance Use Topics  . Alcohol use: No  . Drug use: No    Family History: The patient's family history includes Alcohol abuse in her maternal aunt and maternal uncle; Alzheimer's disease in her mother and another family member; Brain cancer in her maternal grandmother; Cancer in her maternal aunt, maternal uncle, mother, paternal uncle, and paternal uncle; Congestive Heart Failure in her maternal grandfather; Heart attack in her paternal grandfather; Heart attack (age of onset: 7) in her father; Heart disease in her maternal grandfather; Leukemia in an other family member; Lung cancer in an other family member; Stroke in her mother. There is no history of Colon cancer, Breast cancer, or Diabetes.  ROS:   Please see the history of present illness.   (+) Skin discoloration, bilateral LE (+) LE edema (+) Limited LE ambulation/flexibility Additional pertinent ROS negative except as noted.   EKGs/Labs/Other Studies Reviewed:    The  following studies were reviewed today:  LE Venous Bilateral DVT 06/09/2020: RIGHT:  - There is no evidence of deep vein thrombosis in the lower extremity.  However, portions of this examination were limited- see technologist  comments above.    - No cystic structure found in the popliteal fossa.    LEFT:  - There is no evidence of deep vein thrombosis in the lower extremity.  However, portions of this examination were limited- see technologist  comments above.    - No cystic structure found in the popliteal fossa.   CTA 05/22/18 1. 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 Technically, current recommendations call for annual follow-up imaging for thoracic aneurysms of 4 cm or larger. However, 10 year stability of the current aneurysm is certainly reassuring. Aortic aneurysm NOS (ICD10-I71.9), Aortic Atherosclerosis (ICD10-170.0) 2. Small hiatal hernia.   Echo 04/29/18 Left ventricle: The cavity size was normal. There  was mild   concentric hypertrophy. Systolic function was vigorous. The   estimated ejection fraction was in the range of 65% to 70%. Wall   motion was normal; there were no regional wall motion   abnormalities. Doppler parameters are consistent with abnormal   left ventricular relaxation (grade 1 diastolic dysfunction). - Aortic valve: Valve area (VTI): 4.83 cm^2. Valve area (Vmax):   4.21 cm^2. Valve area (Vmean): 4.76 cm^2. - Left atrium: The atrium was mildly dilated. - Right ventricle: The cavity size was normal. Wall thickness was   normal. Systolic function was normal. - Right atrium: The atrium was normal in size. - Pulmonary arteries: Systolic pressure was within the normal   range. - Inferior vena cava: The vessel was normal in size. - Pericardium, extracardiac: There was no pericardial  effusion.  Impressions: - Since the last study on 07/14/2014 there is new ascending aortic   aneurysm measuring 43 mm. A dedicated chest CTA or MRA is   recommended.  Monitor read 06/01/18 13 days of data recorded on Zio monitor. Patient had a min HR of 44 bpm, max HR of 162 bpm, and avg HR of 70 bpm. Predominant underlying rhythm was Sinus Rhythm. 6 episodes of SVT noted, longest lasted 18.4 seconds at 110 bpm. Fastest lasted only 6 beats at a max rate of 162 bpm. No VT, atrial fibrillation, high degree block, or pauses noted. Isolated atrial and ventricular ectopy was rare (<1%). There were 19 patient triggered events. These were all sinus rhythm, rarely with PAC or PVC.   ECHO: 07/14/2014 - Left ventricle: The cavity size was normal. There was mild concentric hypertrophy. Systolic function was vigorous. The estimated ejection fraction was in the range of 65% to 70%. Wall motion was normal; there were no regional wall motion abnormalities. Doppler parameters are consistent with abnormal left ventricular relaxation (grade 1 diastolic dysfunction). There was no evidence of elevated ventricular filling pressure by Doppler parameters. - Aortic valve: Mildly thickened, mildly calcified leaflets. There was no regurgitation. - Ascending aorta: The ascending aorta was normal in size. - Mitral valve: Structurally normal valve. There was trivial regurgitation. - Left atrium: The atrium was normal in size. - Right ventricle: The cavity size was normal. Wall thickness was normal. Systolic pressure couldn&'t be assessed as there was no tricuspid regurgitation. - Atrial septum: No defect or patent foramen ovale was identified. - Tricuspid valve: There was no significant regurgitation. - Pulmonic valve: There was no regurgitation. - Inferior vena cava: The vessel was normal in size. The respirophasic diameter changes were in the normal range (= 50%), consistent with normal  central venous pressure.  MYOVIEW: 07/21/2014 Impression Exercise Capacity: [place] with no exercise. BP Response: Normal blood pressure response. Clinical Symptoms: Mild shortness of breath ECG Impression: No significant ST segment change suggestive of ischemia. Comparison with Prior Nuclear Study: No images to compare  Overall Impression: Normal stress nuclear study.  LV Wall Motion: NL LV Function, EF 72%; NL Wall Motion  EKG:  EKG is personally reviewed.   08/24/2020: no ECG performed today 11/05/18: normal sinus rhythm  Recent Labs: 05/12/2020: TSH 2.570 07/24/2020: ALT 10; Hemoglobin 13.8; Magnesium 2.1; Platelets 322 08/09/2020: BUN 8; Creatinine, Ser 0.87; Potassium 3.5; Sodium 147  Recent Lipid Panel    Component Value Date/Time   CHOL 166 11/02/2018 0827   TRIG 132 11/02/2018 0827   HDL 56 11/02/2018 0827   CHOLHDL 3.0 11/02/2018 0827   CHOLHDL 6.4 (H) 01/01/2016 1012   VLDL 58 (  H) 01/01/2016 1012   LDLCALC 84 11/02/2018 0827   LDLDIRECT 169.8 04/15/2007 1017    Physical Exam:    VS:  BP 132/82 (BP Location: Left Arm, Patient Position: Sitting, Cuff Size: Normal)   Pulse 84   Ht _0  (1.626 m)   Wt 190 lb (86.2 kg)   BMI 32.61 kg/m     Wt Readings from Last 3 Encounters:  08/24/20 190 lb (86.2 kg)  08/09/20 192 lb (87.1 kg)  07/24/20 192 lb 0.3 oz (87.1 kg)    GEN: Well nourished, well developed in no acute distress HEENT: Normal, moist mucous membranes. Hard of hearing. NECK: No JVD CARDIAC: regular rhythm, normal S1 and S2, no rubs or gallops. No murmur. VASCULAR: Radial and DP pulses difficult to assess with edema, but normal capillary refill. No carotid bruits RESPIRATORY:  Clear to auscultation without rales, wheezing or rhonchi  ABDOMEN: Soft, non-tender, non-distended MUSCULOSKELETAL:  Ambulates independently SKIN: Warm and dry. Bilateral brawny edema with skin discoloration to mid calf bilaterally NEUROLOGIC:  Alert and oriented x 3. No  focal neuro deficits noted. PSYCHIATRIC:  Normal affect   ASSESSMENT:    1. Bilateral leg edema   2. Personal history of COVID-19   3. Essential hypertension   4. Nonocclusive coronary atherosclerosis of native coronary artery   5. Cardiac risk counseling   6. Counseling on health promotion and disease prevention    PLAN:    Bilateral LE edema: -with recent cellulitis -skin discoloration and brawny nature consistent with venous insufficiency, but cannot exclude other causes -ultrasound negative for DVT -pending ABI/vascular eval next week. Feet are warm with good cap refill, but cannot palpate distal pulses well due to edema. -with recent covid, will get echocardiogram to assess LV/RV function -counseled on elevation -of note, she has had LE edema (less severe) in the past, and this resolved on chlorthalidone. She has had hyponatremia and hypokalemia in the last few months, so this has been stopped. May require diuretics with close monitoring and possible supplementation of potassium. Will get vascular eval and echo, then reassess -if ABIs unremarkable, would consider Unna boots/compression  Hypertension: near goal today -amlodipine, chlorthalidone recently discontinued given LE edema and electrolyte abnormalities above -continue to monitor -Goal is <130/80.  -she is very concerned re: history with prior kidneys. Will not take ACEi, ARB, MRA.  -would be cautious about beta blockers given fatigue  Nonobstructive CAD:  -aspirin: takes intermittently -statin: has been on in the past, self-discontinued, declined restarting multiple times in the past.  -discussed red flag warning signs that need immediate medical attention  History of TAA: stable on imaging (10 years between)  CV risk factors: -has family history of CAD, increases her risk -class 2 obesity, with serious side effect of CAD: would benefit from weight loss.   CV risk prevention counseling -recommend heart  healthy/Mediterranean diet, with whole grains, fruits, vegetable, fish, lean meats, nuts, and olive oil. Limit salt. -recommend moderate walking, 3-5 times/week for 30-50 minutes each session. Aim for at least 150 minutes.week. Goal should be pace of 3 miles/hours, or walking 1.5 miles in 30 minutes -recommend avoidance of tobacco products. Avoid excess alcohol.  Plan for follow up: 6-8 weeks or sooner as needed  Total time of encounter: 47 minutes total time of encounter, including 31 minutes spent in face-to-face patient care. This time includes coordination of care and counseling regarding recent symptoms and workup to date. Remainder of non-face-to-face time involved reviewing chart documents/testing relevant to the  patient encounter and documentation in the medical record.  Buford Dresser, MD, PhD, [facility]   Medication Adjustments/Labs and Tests Ordered: Current medicines are reviewed at length with the patient today.  Concerns regarding medicines are outlined above.  Orders Placed This Encounter  Procedures  . ECHOCARDIOGRAM COMPLETE   No orders of the defined types were placed in this encounter.   Patient Instructions  Medication Instructions:  Your Physician recommend you continue on your current medication as directed.    *If you need a refill on your cardiac medications before your next appointment, please call your pharmacy*   Lab Work: None   Testing/Procedures: Your physician has requested that you have an echocardiogram. Echocardiography is a painless test that uses sound waves to create images of your heart. It provides your doctor with information about the size and shape of your heart and how well your heart's chambers and valves are working. This procedure takes approximately one hour. There are no restrictions for this procedure. [place] [address]    Follow-Up: At [facility], you and your health needs are our  priority.  As part of our continuing mission to provide you with exceptional heart care, we have created designated Provider Care Teams.  These Care Teams include your primary Cardiologist (physician) and Advanced Practice Providers (APPs -  Physician Assistants and Nurse Practitioners) who all work together to provide you with the care you need, when you need it.  We recommend signing up for the patient portal called "MyChart".  Sign up information is provided on this After Visit Summary.  MyChart is used to connect with patients for Virtual Visits (Telemedicine).  Patients are able to view lab/test results, encounter notes, upcoming appointments, etc.  Non-urgent messages can be sent to your provider as well.   To learn more about what you can do with MyChart, go to [email or web address].    Your next appointment:   6-8 week(s)  The format for your next appointment:   In Person  Provider:   Buford Dresser, MD       [facility] Stump[facility],acting as a scribe for Buford Dresser, MD.,have documented all relevant documentation on the behalf of Buford Dresser, MD,as directed by  Buford Dresser, MD while in the presence of Buford Dresser, MD.  I, Buford Dresser, MD, have reviewed all documentation for this visit. The documentation on 08/24/20 for the exam, diagnosis, procedures, and orders are all accurate and complete.  Signed, Buford Dresser, MD PhD 08/24/2020   [place]

## 2020-08-24 NOTE — Patient Instructions (Signed)
Medication Instructions:  Your Physician recommend you continue on your current medication as directed.    *If you need a refill on your cardiac medications before your next appointment, please call your pharmacy*   Lab Work: None   Testing/Procedures: Your physician has requested that you have an echocardiogram. Echocardiography is a painless test that uses sound waves to create images of your heart. It provides your doctor with information about the size and shape of your heart and how well your heart's chambers and valves are working. This procedure takes approximately one hour. There are no restrictions for this procedure. [place] [address]    Follow-Up: At [facility], you and your health needs are our priority.  As part of our continuing mission to provide you with exceptional heart care, we have created designated Provider Care Teams.  These Care Teams include your primary Cardiologist (physician) and Advanced Practice Providers (APPs -  Physician Assistants and Nurse Practitioners) who all work together to provide you with the care you need, when you need it.  We recommend signing up for the patient portal called "MyChart".  Sign up information is provided on this After Visit Summary.  MyChart is used to connect with patients for Virtual Visits (Telemedicine).  Patients are able to view lab/test results, encounter notes, upcoming appointments, etc.  Non-urgent messages can be sent to your provider as well.   To learn more about what you can do with MyChart, go to [email or web address].    Your next appointment:   6-8 week(s)  The format for your next appointment:   In Person  Provider:   Buford Dresser, MD

## 2020-09-01 ENCOUNTER — Other Ambulatory Visit: Payer: Self-pay

## 2020-09-01 ENCOUNTER — Ambulatory Visit (HOSPITAL_COMMUNITY)
Admission: RE | Admit: 2020-09-01 | Discharge: 2020-09-01 | Disposition: A | Discharge status: Home / Self Care | Payer: Medicare PPO | Source: Ambulatory Visit | Attending: Vascular Surgery | Admitting: Vascular Surgery

## 2020-09-01 ENCOUNTER — Encounter: Payer: Self-pay | Admitting: Vascular Surgery

## 2020-09-01 ENCOUNTER — Ambulatory Visit: Payer: Medicare PPO | Admitting: Vascular Surgery

## 2020-09-01 VITALS — BP 146/92 | HR 69 | Temp 98.2°F | Resp 20 | Ht 64.0 in | Wt 192.7 lb

## 2020-09-01 DIAGNOSIS — R600 Localized edema: Secondary | ICD-10-CM | POA: Diagnosis present

## 2020-09-01 NOTE — Progress Notes (Signed)
Patient ID: Kayla [name], female   DOB: 03/06/1946, 75 y.o.   MRN: [id number]  Reason for Consult: New Patient (Initial Visit)   Referred by Caryl Never*  Subjective:     HPI:  Kayla [name] is a 75 y.o. female history of recent admission to the hospital with swelling and cellulitis of her bilateral lower extremities.  She does not have any previous peripheral arterial disease but does have coronary artery disease with risk factors hyperlipidemia, hypertension and strong family history.  She states that she does have burning in her bilateral lower extremities that prevent her from even wearing pants.  She does not wear compression stockings for this reason.  Swelling is worse throughout the day.  States that she has always had larger legs but this causes her significant pain.  She is able to walk limiting only by pain.  Past Medical History:  Diagnosis Date  . Anemia   . Anxiety   . Asthma   . B12 deficiency   . Bursitis   . Chronic LBP   . Chronic UTI   . Diastolic dysfunction   . Fibromyalgia   . GERD (gastroesophageal reflux disease)   . History of low potassium   . Hyperlipemia   . Hypertension   . Hypothyroid   . IBS (irritable bowel syndrome)   . Inflammatory osteoarthritis   . Kidney infection   . Psoriatic arthritis ([place])   . S/P cardiac cath 11/30/2007   normal coronaries - Dr. Burt Knack (Dr. Debara Pickett reviewed films on 01/10/2016)   Family History  Problem Relation Age of Onset  . Heart attack Father 30  . Lung cancer Other        uncle, non smoker  . Leukemia Other        uncle  . Cancer Mother        oral  . Stroke Mother   . Alzheimer's disease Mother        at 27  . Cancer Maternal Aunt        breast  . Alcohol abuse Maternal Aunt   . Alcohol abuse Maternal Uncle   . Cancer Maternal Uncle        lung, stomach, oral  . Cancer Paternal Uncle        lung  . Congestive Heart Failure Maternal Grandfather   . Heart disease Maternal Grandfather   .  Heart attack Paternal Grandfather   . Cancer Paternal Uncle        bone  . Brain cancer Maternal Grandmother   . Alzheimer's disease Other        maternal great grandmother   . Colon cancer Neg Hx   . Breast cancer Neg Hx   . Diabetes Neg Hx    Past Surgical History:  Procedure Laterality Date  . CESAREAN SECTION  06/10/1975  . CHOLECYSTECTOMY    . LUMBAR LAMINECTOMY/DECOMPRESSION MICRODISCECTOMY Right 05/25/2020   Procedure: MICRODISCECTOMY Right Lumbar three - four;  Surgeon: Newman Pies, MD;  Location: [place];  Service: Neurosurgery;  Laterality: Right;  . NECK SURGERY  1998  . RADIOLOGY WITH ANESTHESIA N/A 05/16/2020   Procedure: MRI LUMBAR SPINE W/CONTRAST;  Surgeon: Radiologist, Medication, MD;  Location: [facility];  Service: Radiology;  Laterality: N/A;  . repair broken C6 & C7      Short Social History:  Social History   Tobacco Use  . Smoking status: Never Smoker  . Smokeless tobacco: Never Used  Substance Use Topics  .  Alcohol use: No    Allergies  Allergen Reactions  . Ace Inhibitors Anaphylaxis    REACTION: glossal edema  . Lisinopril Anaphylaxis    Current Outpatient Medications  Medication Sig Dispense Refill  . acetaminophen (TYLENOL) 500 MG tablet Take 1,000 mg by mouth in the morning, at noon, in the evening, and at bedtime.    . Ascorbic Acid (VITAMIN C) 1000 MG tablet Take 500 mg by mouth daily.    . Calcium-Magnesium-Zinc (CAL-MAG-ZINC PO) Take 1 tablet by mouth 2 (two) times daily.    . chlorthalidone (HYGROTON) 25 MG tablet Take 25 mg by mouth daily.    . Cholecalciferol (VITAMIN D3) 25 MCG (1000 UT) CAPS Take 1,000 Units by mouth daily.    . ferrous sulfate 325 (65 FE) MG tablet Take 325 mg by mouth 2 (two) times daily with a meal.    . levothyroxine (SYNTHROID) 125 MCG tablet TAKE 1 TABLET BY MOUTH EVERY DAY 30 tablet 1  . levothyroxine (SYNTHROID) 125 MCG tablet Take 1 tablet (125 mcg total) by mouth daily. 30 tablet 1  . nystatin  (MYCOSTATIN/NYSTOP) powder Apply 1 application topically 3 (three) times daily. 30 g 0  . nystatin-triamcinolone (MYCOLOG II) cream Apply to affected area twice daily 30 g 0  . polymixin-bacitracin (POLYSPORIN) 500-10000 UNIT/GM OINT ointment Apply 1 application topically 2 (two) times daily. 14 g 0  . pyridOXINE (VITAMIN B-6) 100 MG tablet Take 100 mg by mouth daily.    Marland Kitchen tiZANidine (ZANAFLEX) 2 MG tablet Take 1-2 tablets (2-4 mg total) by mouth every 6 (six) hours as needed for muscle spasms. 30 tablet 0  . triamcinolone (KENALOG) 0.1 % Apply 1 application topically 2 (two) times daily. 45 each 0  . vitamin B-12 (CYANOCOBALAMIN) 500 MCG tablet Take 500 mcg by mouth daily.     No current facility-administered medications for this visit.    Review of Systems  Constitutional:  Constitutional negative. HENT: HENT negative.  Eyes: Eyes negative.  Respiratory: Respiratory negative.  Cardiovascular: Positive for leg swelling.  GI: Gastrointestinal negative.  Musculoskeletal: Positive for gait problem and leg pain.  Hematologic: Hematologic/lymphatic negative.  Psychiatric: Psychiatric negative.        Objective:  Objective   Vitals:   09/01/20 1533  BP: (!) 146/92  Pulse: 69  Resp: 20  Temp: 98.2 F (36.8 C)  SpO2: 96%  Weight: 192 lb 11.2 oz (87.4 kg)  Height: 5\' 4"  (1.626 m)   Body mass index is 33.08 kg/m.  Physical Exam HENT:     Head: Normocephalic.     Nose:     Comments: Wearing a mask Eyes:     Pupils: Pupils are equal, round, and reactive to light.  Cardiovascular:     Pulses:          Radial pulses are 2+ on the right side and 2+ on the left side.       Posterior tibial pulses are 2+ on the right side and 2+ on the left side.  Pulmonary:     Effort: Pulmonary effort is normal.  Abdominal:     General: Abdomen is flat.     Palpations: Abdomen is soft.  Musculoskeletal:     Cervical back: Neck supple.     Right lower leg: Edema present.     Left lower  leg: Edema present.  Skin:    General: Skin is warm and dry.     Capillary Refill: Capillary refill takes less than 2 seconds.  Neurological:     General: No focal deficit present.     Mental Status: She is alert.  Psychiatric:        Mood and Affect: Mood normal.        Behavior: Behavior normal.        Thought Content: Thought content normal.        Judgment: Judgment normal.     Data: ABI Findings:  +---------+------------------+-----+----------+--------+  Right  Rt Pressure (mmHg)IndexWaveform Comment   +---------+------------------+-----+----------+--------+  Brachial 152                      +---------+------------------+-----+----------+--------+  PTA   176        1.16 triphasic       +---------+------------------+-----+----------+--------+  DP    144        0.95 monophasic      +---------+------------------+-----+----------+--------+  Great Toe140        0.92 Normal        +---------+------------------+-----+----------+--------+   +---------+------------------+-----+---------+-------+  Left   Lt Pressure (mmHg)IndexWaveform Comment  +---------+------------------+-----+---------+-------+  Brachial 137                      +---------+------------------+-----+---------+-------+  PTA   168        1.11 triphasic      +---------+------------------+-----+---------+-------+  DP    149        0.98 triphasic      +---------+------------------+-----+---------+-------+  Great Toe110        0.72 Normal        +---------+------------------+-----+---------+-------+   +-------+-----------+-----------+------------+------------+  ABI/TBIToday's ABIToday's TBIPrevious ABIPrevious TBI  +-------+-----------+-----------+------------+------------+  Right 1.16    0.92                   +-------+-----------+-----------+------------+------------+  Left  1.11    0.72                  +-------+-----------+-----------+------------+------------+         Assessment/Plan:     75 year old female here for evaluation bilateral lower extremity pain with normal ABIs and palpable posterior tibial pulses bilaterally.  She does have swelling that is nonpitting.  I am unsure that this is related to her pain she does have significant superficial pain which prevents her from wearing compression.  I recommended elevation and compression as tolerated.  We will check her venous reflux studies however I am unsure if she will able to wear compression to undergo any procedures.  Ultimately she may need symptom control only.  She will follow-up in 4 to 6 weeks with venous reflux studies.     Waynetta Sandy MD [facility]

## 2020-09-03 ENCOUNTER — Other Ambulatory Visit: Payer: Self-pay

## 2020-09-03 DIAGNOSIS — R600 Localized edema: Secondary | ICD-10-CM

## 2020-09-12 ENCOUNTER — Other Ambulatory Visit: Payer: Self-pay

## 2020-09-12 ENCOUNTER — Encounter: Payer: Self-pay | Admitting: Internal Medicine

## 2020-09-12 ENCOUNTER — Ambulatory Visit (INDEPENDENT_AMBULATORY_CARE_PROVIDER_SITE_OTHER): Payer: Medicare PPO | Admitting: Internal Medicine

## 2020-09-12 VITALS — BP 133/75 | HR 85 | Temp 98.6°F | Resp 20 | Wt 196.0 lb

## 2020-09-12 DIAGNOSIS — R21 Rash and other nonspecific skin eruption: Secondary | ICD-10-CM | POA: Diagnosis not present

## 2020-09-12 DIAGNOSIS — L659 Nonscarring hair loss, unspecified: Secondary | ICD-10-CM

## 2020-09-12 DIAGNOSIS — M79605 Pain in left leg: Secondary | ICD-10-CM

## 2020-09-12 DIAGNOSIS — E039 Hypothyroidism, unspecified: Secondary | ICD-10-CM | POA: Diagnosis not present

## 2020-09-12 DIAGNOSIS — M79604 Pain in right leg: Secondary | ICD-10-CM

## 2020-09-12 MED ORDER — TRIAMCINOLONE ACETONIDE 0.1 % EX CREA: 1.0000 "application " | TOPICAL_CREAM | Freq: Two times a day (BID) | CUTANEOUS | 0 refills | Status: DC

## 2020-09-12 NOTE — Progress Notes (Signed)
Subjective:    Kayla [name] - 75 y.o. female MRN [id number]  Date of birth: 03/30/1946  HPI  Kayla [name] is here for follow up. Has been seen several times for LE edema. Was treated for cellulitis. Subsequently saw cardiology and vascular surgery. See below for further details.   From Vascular Surgery Note 24/34:  75 year old female here for evaluation bilateral lower extremity pain with normal ABIs and palpable posterior tibial pulses bilaterally.  She does have swelling that is nonpitting.  I am unsure that this is related to her pain she does have significant superficial pain which prevents her from wearing compression.  I recommended elevation and compression as tolerated.  We will check her venous reflux studies however I am unsure if she will able to wear compression to undergo any procedures.  Ultimately she may need symptom control only.  She will follow-up in 4 to 6 weeks with venous reflux studies.  From Cardiology Note 5/19:  Bilateral LE edema: -with recent cellulitis -skin discoloration and brawny nature consistent with venous insufficiency, but cannot exclude other causes -ultrasound negative for DVT -pending ABI/vascular eval next week. Feet are warm with good cap refill, but cannot palpate distal pulses well due to edema. -with recent covid, will get echocardiogram to assess LV/RV function -counseled on elevation -of note, she has had LE edema (less severe) in the past, and this resolved on chlorthalidone. She has had hyponatremia and hypokalemia in the last few months, so this has been stopped. May require diuretics with close monitoring and possible supplementation of potassium. Will get vascular eval and echo, then reassess -if ABIs unremarkable, would consider Unna boots/compression  Health Maintenance:  Health Maintenance Due  Topic Date Due  . Pneumococcal Vaccine 17-59 Years old (1 of 4 - PCV13) Never done  . COVID-19 Vaccine (1) Never done  . Zoster Vaccines- Shingrix  (1 of 2) Never done  . DEXA SCAN  Never done  . COLONOSCOPY (Pts 45-64yrs Insurance coverage will need to be confirmed)  05/19/2012    -  reports that she has never smoked. She has never used smokeless tobacco. - Review of Systems: Per HPI. - Past Medical History: Patient Active Problem List   Diagnosis Date Noted  . Personal history of COVID-19 08/24/2020  . Body mass index (BMI) 33.0-33.9, adult 06/20/2020  . Status post lumbar microdiscectomy 06/20/2020  . Essential (primary) hypertension 06/20/2020  . Bilateral leg edema 06/17/2020  . Cellulitis of leg, left 06/17/2020  . Cellulitis of leg, right 06/17/2020  . Lumbar herniated disc 05/25/2020  . Hyponatremia 05/12/2020  . Sciatica 05/12/2020  . COVID-19 05/09/2020  . Hypokalemia 05/04/2020  . Dysphagia 04/17/2020  . Renal insufficiency 12/29/2018  . Cyst of left ovary 12/29/2018  . Esophageal dysmotility 12/29/2018  . 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 Acquired hypothyroidism 12/29/2018  . Chronic maxillary sinusitis 07/07/2018  . Snoring 06/01/2018  . Costochondritis 06/01/2018  . Family history of heart disease 06/01/2018  . Class 2 severe obesity due to excess calories with serious comorbidity and body mass index (BMI) of 38.0 to 38.9 in adult ([place]) 06/01/2018  . Multiple atypical skin moles 04/28/2017  . Depression, recurrent ([place]) 07/16/2016  . Adjustment disorder with mixed anxiety and depressed mood 01/15/2016  . Fe Def Anemia 12/22/2015  . Scoliosis (and kyphoscoliosis), idiopathic 12/20/2015  . Osteopenia 12/20/2015  . Fibromyalgia 08/02/2014  . GAD (generalized anxiety disorder) 09/17/2012  . Vitamin D deficiency 01/24/2012  . Fatigue 10/14/2011  .  B12 deficiency   . Hyperlipidemia 11/30/2007  . Coronary artery disease involving native heart without angina pectoris 11/30/2007  . Essential hypertension 01/27/2007  . GERD 01/27/2007  . IBS 01/27/2007   - Medications: reviewed  and updated   Objective:   Physical Exam BP 133/75   Pulse 85   Temp 98.6 F (37 C)   Resp 20   Wt 196 lb (88.9 kg)   SpO2 96%   BMI 33.64 kg/m  Physical Exam Constitutional:      General: She is not in acute distress.    Appearance: She is not diaphoretic.  Cardiovascular:     Rate and Rhythm: Normal rate.     Comments: 1+ pitting edema to shins bilaterally. Skin color changes to LE present, chronic.  Pulmonary:     Effort: Pulmonary effort is normal. No respiratory distress.  Musculoskeletal:        General: Normal range of motion.  Skin:    General: Skin is warm and dry.     Comments: Small patch of erythema with ?vesicles or pustules present on underside of left upper arm.   Neurological:     Mental Status: She is alert and oriented to person, place, and time.  Psychiatric:        Mood and Affect: Affect normal.        Judgment: Judgment normal.            Assessment & Plan:  1. Bilateral leg pain Has been followed by cardiology and vascular surgery. She has LE venous reflux studies scheduled for 7/1. She also has a history of lumbar laminectomy/decompression microdiscectomy in Feb 2022. She has a lot of possible neurological type sensations in her lower legs---pins and needles, extreme sensitivity to touch, temperature changes. She has now cleared cellulitis and the pain has remained and so far vascular work up has been unremarkable. Should follow up with neurology/neurosurgeon to discuss if this could be related to her surgery.   2. Hair loss Sounds most consistent with Telogen Effluvium given report of acute diffuse hair loss and recent illness/stressors. She has no signs of skin disorder. Will check some labs to screen for other etiologies.  - CBC - Comprehensive metabolic panel - TSH - Iron, TIBC and Ferritin Panel - Vitamin B12 - VITAMIN D 25 Hydroxy (Vit-D Deficiency, Fractures) - ANA  3. Acquired hypothyroidism - TSH  4. Rash and nonspecific skin  eruption Suspect related to insect bite or contact dermatitis. Apply sterid ointment. Appears to have small questionable vesicles. Reports itching but no burning pain. Distribution would be very small for shingles but did instruct to call back if rash spreads and would then prescribe antiviral. - triamcinolone cream (KENALOG) 0.1 %; Apply 1 application topically 2 (two) times daily.  Dispense: 30 g; Refill: 0   Phill Myron, D.O. 09/12/2020, 2:01 PM Primary Care at [facility]

## 2020-09-12 NOTE — Progress Notes (Signed)
Follow up- Legs Complain leg pain

## 2020-09-13 LAB — COMPREHENSIVE METABOLIC PANEL
ALT: 11 IU/L (ref 0–32)
AST: 18 IU/L (ref 0–40)
Albumin/Globulin Ratio: 1.7 (ref 1.2–2.2)
Albumin: 3.9 g/dL (ref 3.7–4.7)
Alkaline Phosphatase: 81 IU/L (ref 44–121)
BUN/Creatinine Ratio: 13 (ref 12–28)
BUN: 11 mg/dL (ref 8–27)
Bilirubin Total: 0.2 mg/dL (ref 0.0–1.2)
CO2: 20 mmol/L (ref 20–29)
Calcium: 9.7 mg/dL (ref 8.7–10.3)
Chloride: 108 mmol/L — ABNORMAL HIGH (ref 96–106)
Creatinine, Ser: 0.83 mg/dL (ref 0.57–1.00)
Globulin, Total: 2.3 g/dL (ref 1.5–4.5)
Glucose: 103 mg/dL — ABNORMAL HIGH (ref 65–99)
Potassium: 4.4 mmol/L (ref 3.5–5.2)
Sodium: 147 mmol/L — ABNORMAL HIGH (ref 134–144)
Total Protein: 6.2 g/dL (ref 6.0–8.5)
eGFR: 73 mL/min/{1.73_m2} (ref >59)

## 2020-09-13 LAB — IRON,TIBC AND FERRITIN PANEL
Ferritin: 171 ng/mL — ABNORMAL HIGH (ref 15–150)
Iron Saturation: 21 % (ref 15–55)
Iron: 54 ug/dL (ref 27–139)
Total Iron Binding Capacity: 257 ug/dL (ref 250–450)
UIBC: 203 ug/dL (ref 118–369)

## 2020-09-13 LAB — VITAMIN D 25 HYDROXY (VIT D DEFICIENCY, FRACTURES): Vit D, 25-Hydroxy: 30.2 ng/mL (ref 30.0–100.0)

## 2020-09-13 LAB — TSH: TSH: 6.42 u[IU]/mL — ABNORMAL HIGH (ref 0.450–4.500)

## 2020-09-13 LAB — CBC
Hematocrit: 41.9 % (ref 34.0–46.6)
Hemoglobin: 13.8 g/dL (ref 11.1–15.9)
MCH: 32.4 pg (ref 26.6–33.0)
MCHC: 32.9 g/dL (ref 31.5–35.7)
MCV: 98 fL — ABNORMAL HIGH (ref 79–97)
Platelets: 328 10*3/uL (ref 150–450)
RBC: 4.26 x10E6/uL (ref 3.77–5.28)
RDW: 11.7 % (ref 11.7–15.4)
WBC: 7.5 10*3/uL (ref 3.4–10.8)

## 2020-09-13 LAB — VITAMIN B12: Vitamin B-12: 439 pg/mL (ref 232–1245)

## 2020-09-13 LAB — ANA: Anti Nuclear Antibody (ANA): NEGATIVE

## 2020-09-14 ENCOUNTER — Other Ambulatory Visit: Payer: Self-pay | Admitting: Internal Medicine

## 2020-09-14 DIAGNOSIS — E039 Hypothyroidism, unspecified: Secondary | ICD-10-CM

## 2020-09-14 MED ORDER — LEVOTHYROXINE SODIUM 137 MCG PO TABS: 125.0000 ug | ORAL_TABLET | Freq: Every day | ORAL | 1 refills | Status: DC

## 2020-09-22 ENCOUNTER — Ambulatory Visit (HOSPITAL_COMMUNITY): Payer: Medicare PPO | Attending: Cardiology

## 2020-09-22 DIAGNOSIS — Z8616 Personal history of COVID-19: Secondary | ICD-10-CM | POA: Diagnosis not present

## 2020-09-22 DIAGNOSIS — R600 Localized edema: Secondary | ICD-10-CM | POA: Diagnosis not present

## 2020-09-22 LAB — ECHOCARDIOGRAM COMPLETE
Area-P 1/2: 2.08 cm2
S' Lateral: 2 cm

## 2020-09-27 ENCOUNTER — Telehealth: Payer: Self-pay | Admitting: Internal Medicine

## 2020-09-27 NOTE — Telephone Encounter (Signed)
1) Medication(s) Requested (by name):  tiZANidine (ZANAFLEX) 2 MG tablet    2) Pharmacy of Choice: [id number] [address], [place], [place] [address]  Phone:  [phone]  Fax:  [phone]  DEA #:  [id number]

## 2020-09-28 ENCOUNTER — Other Ambulatory Visit: Payer: Self-pay

## 2020-09-28 DIAGNOSIS — M5431 Sciatica, right side: Secondary | ICD-10-CM

## 2020-09-28 MED ORDER — TIZANIDINE HCL 2 MG PO TABS: 2.0000 mg | ORAL_TABLET | Freq: Four times a day (QID) | ORAL | 0 refills | Status: DC | PRN

## 2020-09-28 NOTE — Telephone Encounter (Signed)
Tizanidine refilled at pt request

## 2020-09-28 NOTE — Progress Notes (Signed)
Tizanidine refilled at pt request

## 2020-10-03 DIAGNOSIS — M79604 Pain in right leg: Secondary | ICD-10-CM | POA: Diagnosis not present

## 2020-10-03 DIAGNOSIS — M79605 Pain in left leg: Secondary | ICD-10-CM | POA: Diagnosis not present

## 2020-10-06 ENCOUNTER — Encounter (HOSPITAL_COMMUNITY): Payer: Medicare PPO

## 2020-10-06 ENCOUNTER — Other Ambulatory Visit: Payer: Self-pay | Admitting: Neurosurgery

## 2020-10-06 ENCOUNTER — Ambulatory Visit: Payer: Medicare PPO

## 2020-10-06 DIAGNOSIS — M79605 Pain in left leg: Secondary | ICD-10-CM

## 2020-10-13 ENCOUNTER — Telehealth: Payer: Self-pay | Admitting: Internal Medicine

## 2020-10-13 ENCOUNTER — Other Ambulatory Visit: Payer: Self-pay | Admitting: Family

## 2020-10-13 ENCOUNTER — Other Ambulatory Visit: Payer: Self-pay

## 2020-10-13 DIAGNOSIS — E538 Deficiency of other specified B group vitamins: Secondary | ICD-10-CM

## 2020-10-13 DIAGNOSIS — M5431 Sciatica, right side: Secondary | ICD-10-CM

## 2020-10-13 MED ORDER — CYANOCOBALAMIN 500 MCG PO TABS: 500.0000 ug | ORAL_TABLET | Freq: Every day | ORAL | 0 refills | Status: DC

## 2020-10-13 NOTE — Telephone Encounter (Signed)
Medication refill request for vitamin B-12 (CYANOCOBALAMIN) 500 MCG tablet [[id number]]  to Pharmacy  [id number] - [place], [address]  [place], [place] [address]  Phone:  [phone]  Fax:  [phone]     Thank you

## 2020-10-13 NOTE — Progress Notes (Signed)
Vitamin B12 refilled.

## 2020-10-20 ENCOUNTER — Other Ambulatory Visit: Payer: Self-pay

## 2020-10-20 ENCOUNTER — Ambulatory Visit
Admission: EM | Admit: 2020-10-20 | Discharge: 2020-10-20 | Disposition: A | Discharge status: Home / Self Care | Payer: Medicare PPO | Attending: Urgent Care | Admitting: Urgent Care

## 2020-10-20 DIAGNOSIS — H9202 Otalgia, left ear: Secondary | ICD-10-CM | POA: Insufficient documentation

## 2020-10-20 DIAGNOSIS — M542 Cervicalgia: Secondary | ICD-10-CM

## 2020-10-20 DIAGNOSIS — J069 Acute upper respiratory infection, unspecified: Secondary | ICD-10-CM | POA: Insufficient documentation

## 2020-10-20 DIAGNOSIS — R519 Headache, unspecified: Secondary | ICD-10-CM | POA: Diagnosis present

## 2020-10-20 DIAGNOSIS — J329 Chronic sinusitis, unspecified: Secondary | ICD-10-CM

## 2020-10-20 DIAGNOSIS — N390 Urinary tract infection, site not specified: Secondary | ICD-10-CM

## 2020-10-20 LAB — POCT URINALYSIS DIP (MANUAL ENTRY)
Glucose, UA: NEGATIVE mg/dL
Leukocytes, UA: NEGATIVE
Nitrite, UA: NEGATIVE
Protein Ur, POC: 100 mg/dL — AB
Spec Grav, UA: >=1.03 — AB (ref 1.010–1.025)
Urobilinogen, UA: 0.2 E.U./dL
pH, UA: 5.5 (ref 5.0–8.0)

## 2020-10-20 MED ORDER — CETIRIZINE HCL 10 MG PO TABS: 10.0000 mg | ORAL_TABLET | Freq: Every day | ORAL | 0 refills | Status: DC

## 2020-10-20 MED ORDER — FLUTICASONE PROPIONATE 50 MCG/ACT NA SUSP: 2.0000 | Freq: Every day | NASAL | 12 refills | Status: DC

## 2020-10-20 NOTE — ED Triage Notes (Signed)
Two day h/o left sided HA, left ear pain and left sided neck pain. Has been taking tylenol with temporary relief.

## 2020-10-20 NOTE — ED Provider Notes (Signed)
[place]   MRN: [id number] DOB: 1945/09/21  Subjective:   Kayla [name] is a 75 y.o. female presenting for 2 day history of acute onset left-sided headache, left-sided sinus pain, left ear pain, left-sided neck pain and slight stiffness.  Patient has a history of chronic maxillary sinusitis.  She has been using Tylenol for her symptoms but no other medications.  Denies confusion, weakness, numbness or tingling.  She would also like to have a urinalysis done as she has chronic UTIs.  Reports that she noted some discoloration in the pads that she wears.  Denies overt dysuria, urinary frequency or hematuria.  No current facility-administered medications for this encounter.  Current Outpatient Medications:    acetaminophen (TYLENOL) 500 MG tablet, Take 1,000 mg by mouth in the morning, at noon, in the evening, and at bedtime., Disp: , Rfl:    Ascorbic Acid (VITAMIN C) 1000 MG tablet, Take 500 mg by mouth daily., Disp: , Rfl:    Calcium-Magnesium-Zinc (CAL-MAG-ZINC PO), Take 1 tablet by mouth 2 (two) times daily., Disp: , Rfl:    chlorthalidone (HYGROTON) 25 MG tablet, Take 25 mg by mouth daily. (Patient not taking: Reported on 09/12/2020), Disp: , Rfl:    Cholecalciferol (VITAMIN D3) 25 MCG (1000 UT) CAPS, Take 1,000 Units by mouth daily., Disp: , Rfl:    ferrous sulfate 325 (65 FE) MG tablet, Take 325 mg by mouth 2 (two) times daily with a meal., Disp: , Rfl:    levothyroxine (SYNTHROID) 137 MCG tablet, Take 1 tablet (137 mcg total) by mouth daily before breakfast., Disp: 30 tablet, Rfl: 1   nystatin (MYCOSTATIN/NYSTOP) powder, Apply 1 application topically 3 (three) times daily., Disp: 30 g, Rfl: 0   nystatin-triamcinolone (MYCOLOG II) cream, Apply to affected area twice daily, Disp: 30 g, Rfl: 0   polymixin-bacitracin (POLYSPORIN) 500-10000 UNIT/GM OINT ointment, Apply 1 application topically 2 (two) times daily., Disp: 14 g, Rfl: 0   pyridOXINE (VITAMIN B-6) 100 MG tablet, Take 100  mg by mouth daily., Disp: , Rfl:    tiZANidine (ZANAFLEX) 2 MG tablet, Take 1-2 tablets (2-4 mg total) by mouth every 6 (six) hours as needed for muscle spasms., Disp: 30 tablet, Rfl: 0   triamcinolone cream (KENALOG) 0.1 %, Apply 1 application topically 2 (two) times daily., Disp: 30 g, Rfl: 0   vitamin B-12 (CYANOCOBALAMIN) 500 MCG tablet, Take 1 tablet (500 mcg total) by mouth daily., Disp: 90 tablet, Rfl: 0   Allergies  Allergen Reactions   Ace Inhibitors Anaphylaxis    REACTION: glossal edema   Lisinopril Anaphylaxis    Past Medical History:  Diagnosis Date   Anemia    Anxiety    Asthma    B12 deficiency    Bursitis    Chronic LBP    Chronic UTI    Diastolic dysfunction    Fibromyalgia    GERD (gastroesophageal reflux disease)    History of low potassium    Hyperlipemia    Hypertension    Hypothyroid    IBS (irritable bowel syndrome)    Inflammatory osteoarthritis    Kidney infection    Psoriatic arthritis ([place])    S/P cardiac cath 11/30/2007   normal coronaries - Dr. Burt Knack (Dr. Debara Pickett reviewed films on 01/10/2016)     Past Surgical History:  Procedure Laterality Date   CESAREAN SECTION  06/10/1975   CHOLECYSTECTOMY     LUMBAR LAMINECTOMY/DECOMPRESSION MICRODISCECTOMY Right 05/25/2020   Procedure: MICRODISCECTOMY Right Lumbar three - four;  Surgeon:  Newman Pies, MD;  Location: [place];  Service: Neurosurgery;  Laterality: Right;   NECK SURGERY  1998   RADIOLOGY WITH ANESTHESIA N/A 05/16/2020   Procedure: MRI LUMBAR SPINE W/CONTRAST;  Surgeon: Radiologist, Medication, MD;  Location: [place];  Service: Radiology;  Laterality: N/A;   repair broken C6 & C7      Family History  Problem Relation Age of Onset   Heart attack Father 32   Lung cancer Other        uncle, non smoker   Leukemia Other        uncle   Cancer Mother        oral   Stroke Mother    Alzheimer's disease Mother        at 18   Cancer Maternal Aunt        breast   Alcohol abuse Maternal Aunt     Alcohol abuse Maternal Uncle    Cancer Maternal Uncle        lung, stomach, oral   Cancer Paternal Uncle        lung   Congestive Heart Failure Maternal Grandfather    Heart disease Maternal Grandfather    Heart attack Paternal Grandfather    Cancer Paternal Uncle        bone   Brain cancer Maternal Grandmother    Alzheimer's disease Other        maternal great grandmother    Colon cancer Neg Hx    Breast cancer Neg Hx    Diabetes Neg Hx     Social History   Tobacco Use   Smoking status: Never   Smokeless tobacco: Never  Vaping Use   Vaping Use: Never used  Substance Use Topics   Alcohol use: No   Drug use: No    ROS   Objective:   Vitals: BP (!) 152/92 (BP Location: Left Arm)   Pulse 95   Temp 98.4 F (36.9 C) (Oral)   Resp 16   SpO2 96%   Physical Exam Constitutional:      General: She is not in acute distress.    Appearance: Normal appearance. She is well-developed. She is not ill-appearing, toxic-appearing or diaphoretic.  HENT:     Head: Normocephalic and atraumatic.     Right Ear: Tympanic membrane, ear canal and external ear normal. No drainage or tenderness. No middle ear effusion. There is no impacted cerumen. Tympanic membrane is not erythematous.     Left Ear: Tympanic membrane, ear canal and external ear normal. No drainage or tenderness.  No middle ear effusion. There is no impacted cerumen. Tympanic membrane is not erythematous.     Nose: Nose normal. No congestion or rhinorrhea.     Mouth/Throat:     Mouth: Mucous membranes are moist. No oral lesions.     Pharynx: Oropharynx is clear. No pharyngeal swelling, oropharyngeal exudate, posterior oropharyngeal erythema or uvula swelling.     Tonsils: No tonsillar exudate or tonsillar abscesses.  Eyes:     General: No scleral icterus.       Right eye: No discharge.        Left eye: No discharge.     Extraocular Movements: Extraocular movements intact.     Right eye: Normal extraocular motion.      Left eye: Normal extraocular motion.     Conjunctiva/sclera: Conjunctivae normal.     Pupils: Pupils are equal, round, and reactive to light.  Cardiovascular:     Rate and Rhythm: Normal  rate.  Pulmonary:     Effort: Pulmonary effort is normal.  Musculoskeletal:     Cervical back: Normal range of motion and neck supple. No rigidity or tenderness.  Lymphadenopathy:     Cervical: No cervical adenopathy.  Skin:    General: Skin is warm and dry.  Neurological:     General: No focal deficit present.     Mental Status: She is alert and oriented to person, place, and time.     Cranial Nerves: No cranial nerve deficit.     Motor: No weakness.     Coordination: Coordination normal.     Gait: Gait normal.     Deep Tendon Reflexes: Reflexes normal.  Psychiatric:        Mood and Affect: Mood is anxious. Mood is not depressed or elated. Affect is labile and tearful. Affect is not blunt, flat, angry or inappropriate.        Thought Content: Thought content does not include homicidal or suicidal ideation.   Results for orders placed or performed during the hospital encounter of 10/20/20 (from the past 24 hour(s))  POCT urinalysis dipstick     Status: Abnormal   Collection Time: 10/20/20  7:56 PM  Result Value Ref Range   Color, UA yellow yellow   Clarity, UA clear clear   Glucose, UA negative negative mg/dL   Bilirubin, UA moderate (A) negative   Ketones, POC UA trace (5) (A) negative mg/dL   Spec Grav, UA >=1.030 (A) 1.010 - 1.025   Blood, UA trace-intact (A) negative   pH, UA 5.5 5.0 - 8.0   Protein Ur, POC =100 (A) negative mg/dL   Urobilinogen, UA 0.2 0.2 or 1.0 E.U./dL   Nitrite, UA Negative Negative   Leukocytes, UA Negative Negative    Assessment and Plan :   PDMP not reviewed this encounter.  1. Viral URI   2. Left-sided headache   3. Left ear pain   4. Neck pain   5. Chronic sinusitis, unspecified location   6. Chronic UTI     Recommended managing for viral  respiratory illness, viral upper respiratory infection.  Patient is very nervous about having a severe infection as she previously had sepsis.  I discussed antibiotic stewardship with patient.  Recommended conservative management for now, supportive care.  At discharge, she requested a urinalysis just to make sure that she does not have a urinary tract infection.  Urine culture pending.  Recommended better hydration for now. Counseled patient on potential for adverse effects with medications prescribed/recommended today, ER and return-to-clinic precautions discussed, patient verbalized understanding.    Jaynee Eagles, PA-C 10/20/20 2007

## 2020-10-22 LAB — URINE CULTURE

## 2020-10-24 ENCOUNTER — Other Ambulatory Visit: Payer: Self-pay | Admitting: Internal Medicine

## 2020-10-24 DIAGNOSIS — R2231 Localized swelling, mass and lump, right upper limb: Secondary | ICD-10-CM

## 2020-10-26 ENCOUNTER — Other Ambulatory Visit: Payer: Self-pay

## 2020-10-26 ENCOUNTER — Telehealth: Payer: Self-pay | Admitting: Family

## 2020-10-26 DIAGNOSIS — M5431 Sciatica, right side: Secondary | ICD-10-CM

## 2020-10-26 MED ORDER — TIZANIDINE HCL 2 MG PO TABS: 2.0000 mg | ORAL_TABLET | Freq: Four times a day (QID) | ORAL | 0 refills | Status: DC | PRN

## 2020-10-26 NOTE — Telephone Encounter (Signed)
1) Medication(s) Requested (by name): tiZANidine (ZANAFLEX) 2 MG tablet   2) Pharmacy of Choice: [id number] - [place], [address]   3) Special Requests:   Approved medications will be sent to the pharmacy, we will reach out if there is an issue.  Requests made after 3pm may not be addressed until the following business day!  If a patient is unsure of the name of the medication(s) please note and ask patient to call back when they are able to provide all info, do not send to responsible party until all information is available!

## 2020-11-16 ENCOUNTER — Ambulatory Visit (HOSPITAL_BASED_OUTPATIENT_CLINIC_OR_DEPARTMENT_OTHER): Payer: Medicare PPO | Admitting: Cardiology

## 2020-11-16 ENCOUNTER — Other Ambulatory Visit: Payer: Self-pay

## 2020-11-16 ENCOUNTER — Encounter (HOSPITAL_BASED_OUTPATIENT_CLINIC_OR_DEPARTMENT_OTHER): Payer: Self-pay | Admitting: Cardiology

## 2020-11-16 VITALS — BP 140/82 | HR 80 | Ht 64.0 in | Wt 194.0 lb

## 2020-11-16 DIAGNOSIS — I2510 Atherosclerotic heart disease of native coronary artery without angina pectoris: Secondary | ICD-10-CM | POA: Diagnosis not present

## 2020-11-16 DIAGNOSIS — Z8249 Family history of ischemic heart disease and other diseases of the circulatory system: Secondary | ICD-10-CM

## 2020-11-16 DIAGNOSIS — R600 Localized edema: Secondary | ICD-10-CM

## 2020-11-16 DIAGNOSIS — I10 Essential (primary) hypertension: Secondary | ICD-10-CM

## 2020-11-16 NOTE — Patient Instructions (Signed)

## 2020-11-16 NOTE — Progress Notes (Signed)
Cardiology Office Note:    Date:  11/16/2020   ID:  Kayla [name], DOB 04/11/1945, MRN [id number]  PCP:  Camillia Herter, NP  Cardiologist:  Buford Dresser, MD PhD  Referring MD: Caryl Never*   CC: follow up  History of Present Illness:    Kayla [name] is a 75 y.o. female with a hx of mild CAD, HTN, HLD, IBS, hypothyroid, GERD, fibromyalgia, OA who is seen for follow up today. I first met her on 06/01/18. Seen initially by Rosaria Ferries 03/26/18 for hypertension and chest discomfort.  Today: Overall, she is feeling miserable and is tearful at times. She is in chronic LE pain that is worsening. The skin around her ankles is tight, and this affects her balance. The pain and sensitivity is also causing significant insomnia and headaches. She has difficulty bending over due to pain. She is frustrated by the process that has been ongoing to try to find the reason for her pain.   Since her last visit, she has not noticed any significant swelling like she used to have. However, she continues to have tenderness at her spinal surgical site from 05/2020, and pruritis over her body.  Her blood pressure is 140/80 in clinic today, and remains above goal. At one time she had a sinus infection and noted her blood pressure was in the 160's.   She had requested an MRI because she believes she has inflammation or nerve damage in her spine. This has not been completed as she is having difficulty getting an appointment with a facility that can sedate her.  She denies any palpitations, shortness of breath, lightheadedness, syncope, orthopnea, or PND.   Past Medical History:  Diagnosis Date   Anemia    Anxiety    Asthma    B12 deficiency    Bursitis    Chronic LBP    Chronic UTI    Diastolic dysfunction    Fibromyalgia    GERD (gastroesophageal reflux disease)    History of low potassium    Hyperlipemia    Hypertension    Hypothyroid    IBS (irritable bowel syndrome)     Inflammatory osteoarthritis    Kidney infection    Psoriatic arthritis ([place])    S/P cardiac cath 11/30/2007   normal coronaries - Dr. Burt Knack (Dr. Debara Pickett reviewed films on 01/10/2016)    Past Surgical History:  Procedure Laterality Date   CESAREAN SECTION  06/10/1975   CHOLECYSTECTOMY     LUMBAR LAMINECTOMY/DECOMPRESSION MICRODISCECTOMY Right 05/25/2020   Procedure: MICRODISCECTOMY Right Lumbar three - four;  Surgeon: Newman Pies, MD;  Location: [facility];  Service: Neurosurgery;  Laterality: Right;   NECK SURGERY  1998   RADIOLOGY WITH ANESTHESIA N/A 05/16/2020   Procedure: MRI LUMBAR SPINE W/CONTRAST;  Surgeon: Radiologist, Medication, MD;  Location: [facility];  Service: Radiology;  Laterality: N/A;   repair broken C6 & C7      Current Medications: Current Outpatient Medications on File Prior to Visit  Medication Sig   acetaminophen (TYLENOL) 500 MG tablet Take 1,000 mg by mouth in the morning, at noon, in the evening, and at bedtime.   Ascorbic Acid (VITAMIN C) 1000 MG tablet Take 500 mg by mouth daily.   Calcium-Magnesium-Zinc (CAL-MAG-ZINC PO) Take 1 tablet by mouth 2 (two) times daily.   Cholecalciferol (VITAMIN D3) 25 MCG (1000 UT) CAPS Take 1,000 Units by mouth daily.   ferrous sulfate 325 (65 FE) MG tablet Take 325 mg by mouth 2 (two)  times daily with a meal.   levothyroxine (SYNTHROID) 137 MCG tablet Take 1 tablet (137 mcg total) by mouth daily before breakfast.   nystatin (MYCOSTATIN/NYSTOP) powder Apply 1 application topically 3 (three) times daily.   nystatin-triamcinolone (MYCOLOG II) cream Apply to affected area twice daily   polymixin-bacitracin (POLYSPORIN) 500-10000 UNIT/GM OINT ointment Apply 1 application topically 2 (two) times daily.   pyridOXINE (VITAMIN B-6) 100 MG tablet Take 100 mg by mouth daily.   tiZANidine (ZANAFLEX) 2 MG tablet Take 1-2 tablets (2-4 mg total) by mouth every 6 (six) hours as needed for muscle spasms.   triamcinolone cream (KENALOG) 0.1 % Apply 1  application topically 2 (two) times daily.   vitamin B-12 (CYANOCOBALAMIN) 500 MCG tablet Take 1 tablet (500 mcg total) by mouth daily.   cetirizine (ZYRTEC ALLERGY) 10 MG tablet Take 1 tablet (10 mg total) by mouth daily. (Patient not taking: Reported on 11/16/2020)   chlorthalidone (HYGROTON) 25 MG tablet Take 25 mg by mouth daily. (Patient not taking: Reported on 11/16/2020)   fluticasone (FLONASE) 50 MCG/ACT nasal spray Place 2 sprays into both nostrils daily. (Patient not taking: Reported on 11/16/2020)   [DISCONTINUED] amLODipine (NORVASC) 5 MG tablet Take 1 tablet (5 mg total) by mouth daily. (Patient not taking: Reported on 06/21/2020)   No current facility-administered medications on file prior to visit.     Allergies:   Ace inhibitors and Lisinopril   Social History   Tobacco Use   Smoking status: Never   Smokeless tobacco: Never  Vaping Use   Vaping Use: Never used  Substance Use Topics   Alcohol use: No   Drug use: No    Family History: The patient's family history includes Alcohol abuse in her maternal aunt and maternal uncle; Alzheimer's disease in her mother and another family member; Brain cancer in her maternal grandmother; Cancer in her maternal aunt, maternal uncle, mother, paternal uncle, and paternal uncle; Congestive Heart Failure in her maternal grandfather; Heart attack in her paternal grandfather; Heart attack (age of onset: 73) in her father; Heart disease in her maternal grandfather; Leukemia in an other family member; Lung cancer in an other family member; Stroke in her mother. There is no history of Colon cancer, Breast cancer, or Diabetes.  ROS:   Please see the history of present illness.   (+) Chronic bilateral LE pain (+) Imbalance (+) Insomnia (+) Headaches (+) Stress (+) Pruritis (+) Tearful Additional pertinent ROS negative except as noted.   EKGs/Labs/Other Studies Reviewed:    The following studies were reviewed today:  Echo 09/22/2020: 1.  Left ventricular ejection fraction, by estimation, is 60 to 65%. The  left ventricle has normal function. The left ventricle has no regional  wall motion abnormalities. Left ventricular diastolic parameters are  consistent with Grade I diastolic  dysfunction (impaired relaxation). Elevated left atrial pressure.   2. Right ventricular systolic function is normal. The right ventricular  size is normal. There is normal pulmonary artery systolic pressure.   3. Left atrial size was mildly dilated.   4. The mitral valve is normal in structure. Trivial mitral valve  regurgitation. No evidence of mitral stenosis. Moderate mitral annular  calcification.   5. The aortic valve has an indeterminant number of cusps. Aortic valve  regurgitation is not visualized. No aortic stenosis is present.   6. There is mild dilatation of the ascending aorta, measuring 43 mm.   7. The inferior vena cava is normal in size with greater than 50%  respiratory variability, suggesting right atrial pressure of 3 mmHg.  LE Venous Bilateral DVT 06/09/2020: RIGHT:  - There is no evidence of deep vein thrombosis in the lower extremity.  However, portions of this examination were limited- see technologist  comments above.     - No cystic structure found in the popliteal fossa.     LEFT:  - There is no evidence of deep vein thrombosis in the lower extremity.  However, portions of this examination were limited- see technologist  comments above.     - No cystic structure found in the popliteal fossa.   CTA 05/22/18 1. 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 Technically, current recommendations call for annual follow-up imaging for thoracic aneurysms of 4 cm or larger. However, 10 year stability of the current aneurysm is certainly reassuring. Aortic aneurysm NOS  (ICD10-I71.9), Aortic Atherosclerosis (ICD10-170.0) 2. Small hiatal hernia.   Echo 04/29/18 Left ventricle: The cavity size was normal. There was mild   concentric hypertrophy. Systolic function was vigorous. The   estimated ejection fraction was in the range of 65% to 70%. Wall   motion was normal; there were no regional wall motion   abnormalities. Doppler parameters are consistent with abnormal   left ventricular relaxation (grade 1 diastolic dysfunction). - Aortic valve: Valve area (VTI): 4.83 cm^2. Valve area (Vmax):   4.21 cm^2. Valve area (Vmean): 4.76 cm^2. - Left atrium: The atrium was mildly dilated. - Right ventricle: The cavity size was normal. Wall thickness was   normal. Systolic function was normal. - Right atrium: The atrium was normal in size. - Pulmonary arteries: Systolic pressure was within the normal   range. - Inferior vena cava: The vessel was normal in size. - Pericardium, extracardiac: There was no pericardial effusion.   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 A dedicated chest CTA or MRA is   recommended.  Monitor read 06/01/18 13 days of data recorded on Zio monitor. Patient had a min HR of 44 bpm, max HR of 162 bpm, and avg HR of 70 bpm. Predominant underlying rhythm was Sinus Rhythm. 6 episodes of SVT noted, longest lasted 18.4 seconds at 110 bpm. Fastest lasted only 6 beats at a max rate of 162 bpm. No VT, atrial fibrillation, high degree block, or pauses noted. Isolated atrial and ventricular ectopy was rare (<1%). There were 19 patient triggered events. These were all sinus rhythm, rarely with PAC or PVC.   ECHO: 07/14/2014 - Left ventricle: The cavity size was normal. There was mild   concentric hypertrophy. Systolic function was vigorous. The   estimated ejection fraction was in the range of 65% to 70%. Wall   motion was normal; there were no regional wall motion   abnormalities.  Doppler parameters are consistent with abnormal   left ventricular relaxation (grade 1 diastolic dysfunction).   There was no evidence of elevated ventricular filling pressure by   Doppler parameters. - Aortic valve: Mildly thickened, mildly calcified leaflets. There   was no regurgitation. - Ascending aorta: The ascending aorta was normal in size. - Mitral valve: Structurally normal valve. There was trivial   regurgitation. - Left atrium: The atrium was normal in size. - Right ventricle: The cavity size was normal. Wall thickness was   normal. Systolic pressure couldn&'t be assessed  as there was no   tricuspid regurgitation. - Atrial septum: No defect or patent foramen ovale was identified. - Tricuspid valve: There was no significant regurgitation. - Pulmonic valve: There was no regurgitation. - Inferior vena cava: The vessel was normal in size. The   respirophasic diameter changes were in the normal range (= 50%),   consistent with normal central venous pressure.   MYOVIEW: 07/21/2014 Impression Exercise Capacity:  [place] with no exercise. BP Response:  Normal blood pressure response. Clinical Symptoms:  Mild shortness of breath ECG Impression:  No significant ST segment change suggestive of ischemia. Comparison with Prior Nuclear Study: No images to compare   Overall Impression:  Normal stress nuclear study.   LV Wall Motion:  NL LV Function, EF 72%; NL Wall Motion  EKG:  EKG is personally reviewed. 11/16/2020: EKG is not ordered today.   08/24/2020: EKG was not ordered. 11/05/18: normal sinus rhythm  Recent Labs: 07/24/2020: Magnesium 2.1 09/12/2020: ALT 11; BUN 11; Creatinine, Ser 0.83; Hemoglobin 13.8; Platelets 328; Potassium 4.4; Sodium 147; TSH 6.420  Recent Lipid Panel    Component Value Date/Time   CHOL 166 11/02/2018 0827   TRIG 132 11/02/2018 0827   HDL 56 11/02/2018 0827   CHOLHDL 3.0 11/02/2018 0827   CHOLHDL 6.4 (H) 01/01/2016 1012   VLDL 58 (H) 01/01/2016  1012   LDLCALC 84 11/02/2018 0827   LDLDIRECT 169.8 04/15/2007 1017    Physical Exam:    VS:  BP 140/82   Pulse 80   Ht 5' 4"  (1.626 m)   Wt 194 lb (88 kg)   SpO2 98%   BMI 33.30 kg/m     Wt Readings from Last 3 Encounters:  11/16/20 194 lb (88 kg)  09/12/20 196 lb (88.9 kg)  09/01/20 192 lb 11.2 oz (87.4 kg)    GEN: Well nourished, well developed in no acute distress HEENT: Normal, moist mucous membranes. Hard of hearing. NECK: No JVD CARDIAC: regular rhythm, normal S1 and S2, no rubs or gallops. No murmur. VASCULAR: Radial and DP pulses 2+ bilaterally, no carotid bruits RESPIRATORY:  Clear to auscultation without rales, wheezing or rhonchi  ABDOMEN: Soft, non-tender, non-distended MUSCULOSKELETAL:  Ambulates independently SKIN: Warm and dry. Trivial bilateral LE edema with mild chronic skin discoloration NEUROLOGIC:  Alert and oriented x 3. No focal neuro deficits noted. PSYCHIATRIC:  Normal affect   ASSESSMENT:    1. Bilateral lower extremity edema   2. Essential hypertension   3. Nonocclusive coronary atherosclerosis of native coronary artery   4. Family history of heart disease     PLAN:    Bilateral LE edema: -much improved compared to prior -ultrasound negative for DVT -ABI/arterial evaluation normal -echo reassuring, normal filling pressures, normal EF, only minimal DD -very tender, does not tolerate anything touching her feet, unlikely to tolerate compression stockings.  -counseled on elevation -as this has improved, will avoid diuretics given prior profound effect on electrolytes  Hypertension: above goal today, but in pain and stressed/tearful -amlodipine, chlorthalidone previously discontinued given LE edema and electrolyte abnormalities above -continue to monitor -Goal is <130/80.  -she is very concerned re: history with prior kidneys. Will not take ACEi, ARB, MRA.  -would be cautious about beta blockers given fatigue  Nonobstructive CAD:   -aspirin: takes intermittently -statin: has been on in the past, self-discontinued, declined restarting multiple times in the past.  -discussed red flag warning signs that need immediate medical attention  History of TAA: stable on imaging (10 years between)  CV risk factors: -has family history of CAD, increases her risk -class 2 obesity, with serious side effect of CAD: would benefit from weight loss.   CV risk prevention counseling -recommend heart healthy/Mediterranean diet, with whole grains, fruits, vegetable, fish, lean meats, nuts, and olive oil. Limit salt. -recommend moderate walking, 3-5 times/week for 30-50 minutes each session. Aim for at least 150 minutes.week. Goal should be pace of 3 miles/hours, or walking 1.5 miles in 30 minutes -recommend avoidance of tobacco products. Avoid excess alcohol.  Plan for follow up: 1 year or sooner as needed  Buford Dresser, MD, PhD, [facility]   Medication Adjustments/Labs and Tests Ordered: Current medicines are reviewed at length with the patient today.  Concerns regarding medicines are outlined above.   No orders of the defined types were placed in this encounter.  No orders of the defined types were placed in this encounter.  Patient Instructions  Medication Instructions:  Your Physician recommend you continue on your current medication as directed.    *If you need a refill on your cardiac medications before your next appointment, please call your pharmacy*   Lab Work: None ordered today   Testing/Procedures: None ordered today   Follow-Up: At [facility], you and your health needs are our priority.  As part of our continuing mission to provide you with exceptional heart care, we have created designated Provider Care Teams.  These Care Teams include your primary Cardiologist (physician) and Advanced Practice Providers (APPs -  Physician Assistants and Nurse Practitioners) who all work  together to provide you with the care you need, when you need it.  We recommend signing up for the patient portal called "MyChart".  Sign up information is provided on this After Visit Summary.  MyChart is used to connect with patients for Virtual Visits (Telemedicine).  Patients are able to view lab/test results, encounter notes, upcoming appointments, etc.  Non-urgent messages can be sent to your provider as well.   To learn more about what you can do with MyChart, go to [email or web address].    Your next appointment:   1 year(s)  The format for your next appointment:   In Person  Provider:   Buford Dresser, MD      [facility] Stump[facility],acting as a scribe for Buford Dresser, MD.,have documented all relevant documentation on the behalf of Buford Dresser, MD,as directed by  Buford Dresser, MD while in the presence of Buford Dresser, MD.  I, Buford Dresser, MD, have reviewed all documentation for this visit. The documentation on 11/16/20 for the exam, diagnosis, procedures, and orders are all accurate and complete.  Signed, Buford Dresser, MD PhD 11/16/2020   [place]

## 2020-11-27 ENCOUNTER — Other Ambulatory Visit: Payer: Self-pay | Admitting: Family

## 2020-11-27 DIAGNOSIS — R2231 Localized swelling, mass and lump, right upper limb: Secondary | ICD-10-CM

## 2020-11-28 ENCOUNTER — Other Ambulatory Visit: Payer: Medicare PPO

## 2020-12-05 ENCOUNTER — Telehealth: Payer: Self-pay | Admitting: Family

## 2020-12-05 NOTE — Telephone Encounter (Signed)
levothyroxine (SYNTHROID) 137 MCG tablet [id number]   tiZANidine (ZANAFLEX) 2 MG tablet [id number]   Pt state the two meds listed above are out and need refills last seen 09/12/20

## 2020-12-06 ENCOUNTER — Telehealth: Payer: Self-pay | Admitting: Family Medicine

## 2020-12-06 ENCOUNTER — Other Ambulatory Visit: Payer: Self-pay

## 2020-12-06 DIAGNOSIS — E039 Hypothyroidism, unspecified: Secondary | ICD-10-CM

## 2020-12-06 DIAGNOSIS — M5431 Sciatica, right side: Secondary | ICD-10-CM

## 2020-12-06 MED ORDER — TIZANIDINE HCL 2 MG PO TABS: 2.0000 mg | ORAL_TABLET | Freq: Four times a day (QID) | ORAL | 0 refills | Status: DC | PRN

## 2020-12-06 MED ORDER — LEVOTHYROXINE SODIUM 137 MCG PO TABS: 125.0000 ug | ORAL_TABLET | Freq: Every day | ORAL | 1 refills | Status: DC

## 2020-12-06 NOTE — Telephone Encounter (Signed)
Per patient request Levothyroxine and Tizanidine refilled for courtesy 30 day supply. Please schedule appointment for additional refills

## 2020-12-06 NOTE — Progress Notes (Signed)
Per patient request Levothyroxine and Tizanidine refilled for courtesy 30 day supply. Please schedule appointment for additional refills

## 2020-12-06 NOTE — Telephone Encounter (Signed)
Called pt x3 and no answer I tired to schedule and appt so they can get med refills please advise to call and make an appt

## 2020-12-09 ENCOUNTER — Telehealth: Payer: Self-pay

## 2020-12-09 NOTE — Telephone Encounter (Signed)
Attempted to contact pt twice to schedule AWV and could not LVM.   Pt can be scheduled on [facility] weekend schedule.  60 min appt

## 2021-01-02 NOTE — Telephone Encounter (Signed)
Called left vm for pt to make an at to see the doctor for med refills

## 2021-02-05 ENCOUNTER — Telehealth: Payer: Self-pay | Admitting: Family Medicine

## 2021-02-05 NOTE — Telephone Encounter (Signed)
Pt requesting   tiZANidine (ZANAFLEX) 2 MG tablet [[id number]]  Pharmacy  [id number] [address], [address] [place], [place] [address]  Phone:  [phone]  Fax:  [phone]     Please advise and thank you

## 2021-02-06 NOTE — Telephone Encounter (Signed)
Patient has appt on 02/12/2021

## 2021-02-12 ENCOUNTER — Encounter: Payer: Self-pay | Admitting: Family Medicine

## 2021-02-12 ENCOUNTER — Ambulatory Visit (INDEPENDENT_AMBULATORY_CARE_PROVIDER_SITE_OTHER): Payer: Medicare PPO | Admitting: Family Medicine

## 2021-02-12 ENCOUNTER — Other Ambulatory Visit: Payer: Self-pay

## 2021-02-12 VITALS — BP 160/91 | HR 84 | Temp 98.5°F | Resp 16 | Wt 195.2 lb

## 2021-02-12 DIAGNOSIS — I10 Essential (primary) hypertension: Secondary | ICD-10-CM | POA: Diagnosis not present

## 2021-02-12 DIAGNOSIS — R300 Dysuria: Secondary | ICD-10-CM | POA: Diagnosis not present

## 2021-02-12 LAB — POCT URINALYSIS DIP (CLINITEK)
Bilirubin, UA: NEGATIVE
Glucose, UA: NEGATIVE mg/dL
Ketones, POC UA: NEGATIVE mg/dL
Nitrite, UA: NEGATIVE
POC PROTEIN,UA: NEGATIVE
Spec Grav, UA: 1.025 (ref 1.010–1.025)
Urobilinogen, UA: 0.2 E.U./dL
pH, UA: 5.5 (ref 5.0–8.0)

## 2021-02-12 MED ORDER — SULFAMETHOXAZOLE-TRIMETHOPRIM 800-160 MG PO TABS
1.0000 | ORAL_TABLET | Freq: Two times a day (BID) | ORAL | 0 refills | Status: DC
Start: 2021-02-12 — End: 2021-05-02

## 2021-02-12 NOTE — Progress Notes (Signed)
Patient is here for follow-up visit. Patient c/o possible UTI which she feels has been present x 2 days

## 2021-02-12 NOTE — Progress Notes (Signed)
Established  Patient Office Visit  Subjective:  Patient ID: Kayla [name], female    DOB: [birth year]  Age: 75 y.o. MRN: [id number]  CC:  Chief Complaint  Patient presents with   Follow-up   Urinary Tract Infection    HPI CINCERE DEPREY presents for complaint of dysuria for about 2 days. She denies fever/chills. She did OTC testing which indicated UTI.   Past Medical History:  Diagnosis Date   Anemia    Anxiety    Asthma    B12 deficiency    Bursitis    Chronic LBP    Chronic UTI    Diastolic dysfunction    Fibromyalgia    GERD (gastroesophageal reflux disease)    History of low potassium    Hyperlipemia    Hypertension    Hypothyroid    IBS (irritable bowel syndrome)    Inflammatory osteoarthritis    Kidney infection    Psoriatic arthritis ([place])    S/P cardiac cath 11/30/2007   normal coronaries - Dr. Burt Knack (Dr. Debara Pickett reviewed films on 01/10/2016)    Past Surgical History:  Procedure Laterality Date   CESAREAN SECTION  06/10/1975   CHOLECYSTECTOMY     LUMBAR LAMINECTOMY/DECOMPRESSION MICRODISCECTOMY Right 05/25/2020   Procedure: MICRODISCECTOMY Right Lumbar three - four;  Surgeon: Newman Pies, MD;  Location: [place];  Service: Neurosurgery;  Laterality: Right;   NECK SURGERY  1998   RADIOLOGY WITH ANESTHESIA N/A 05/16/2020   Procedure: MRI LUMBAR SPINE W/CONTRAST;  Surgeon: Radiologist, Medication, MD;  Location: [place];  Service: Radiology;  Laterality: N/A;   repair broken C6 & C7      Family History  Problem Relation Age of Onset   Heart attack Father 45   Lung cancer Other        uncle, non smoker   Leukemia Other        uncle   Cancer Mother        oral   Stroke Mother    Alzheimer's disease Mother        at 57   Cancer Maternal Aunt        breast   Alcohol abuse Maternal Aunt    Alcohol abuse Maternal Uncle    Cancer Maternal Uncle        lung, stomach, oral   Cancer Paternal Uncle        lung   Congestive Heart Failure Maternal Grandfather     Heart disease Maternal Grandfather    Heart attack Paternal Grandfather    Cancer Paternal Uncle        bone   Brain cancer Maternal Grandmother    Alzheimer's disease Other        maternal great grandmother    Colon cancer Neg Hx    Breast cancer Neg Hx    Diabetes Neg Hx     Social History   Socioeconomic History   Marital status: Widowed    Spouse name: Not on file   Number of children: 2   Years of education: Not on file   Highest education level: Bachelor's degree (e.g., BA, AB, BS)  Occupational History   Not on file  Tobacco Use   Smoking status: Never   Smokeless tobacco: Never  Vaping Use   Vaping Use: Never used  Substance and Sexual Activity   Alcohol use: No   Drug use: No   Sexual activity: Not on file  Other Topics Concern   Not on file  Social  History Narrative   Lost husband 2009, lost mother 2016      Lives at home (son & 3 grandchildren live with her)   Caffeine: 10-12 oz daily   Social Determinants of Health   Financial Resource Strain: Not on file  Food Insecurity: Not on file  Transportation Needs: Not on file  Physical Activity: Not on file  Stress: Not on file  Social Connections: Not on file  Intimate Partner Violence: Not on file    ROS Review of Systems  Constitutional:  Negative for fever.  Genitourinary:  Positive for dysuria.  All other systems reviewed and are negative.  Objective:   Today's Vitals: BP (!) 160/91 (BP Location: Right Arm, Patient Position: Sitting, Cuff Size: Large)   Pulse 84   Temp 98.5 F (36.9 C) (Oral)   Resp 16   Wt 195 lb 3.2 oz (88.5 kg)   BMI 33.51 kg/m   Physical Exam Vitals and nursing note reviewed.  Constitutional:      General: She is not in acute distress. Cardiovascular:     Rate and Rhythm: Normal rate and regular rhythm.  Pulmonary:     Effort: Pulmonary effort is normal.     Breath sounds: Normal breath sounds.  Abdominal:     Palpations: Abdomen is soft.     Tenderness:  There is no abdominal tenderness.  Neurological:     General: No focal deficit present.     Mental Status: She is alert and oriented to person, place, and time.    Assessment & Plan:   1. Dysuria Urine for culture. Bactrim DS prescribed bid for 3 days.  - Urine Culture - sulfamethoxazole-trimethoprim (BACTRIM DS) 800-160 MG tablet; Take 1 tablet by mouth 2 (two) times daily.  Dispense: 6 tablet; Refill: 0 - POCT URINALYSIS DIP (CLINITEK)  2. Essential hypertension Elevated reading. Patient indicates it is related to elevation of her chronic pain and defers further eval/mgt at this time.     Outpatient Encounter Medications as of 02/12/2021  Medication Sig   acetaminophen (TYLENOL) 500 MG tablet Take 1,000 mg by mouth in the morning, at noon, in the evening, and at bedtime.   Ascorbic Acid (VITAMIN C) 1000 MG tablet Take 500 mg by mouth daily.   Calcium-Magnesium-Zinc (CAL-MAG-ZINC PO) Take 1 tablet by mouth 2 (two) times daily.   cetirizine (ZYRTEC ALLERGY) 10 MG tablet Take 1 tablet (10 mg total) by mouth daily.   chlorthalidone (HYGROTON) 25 MG tablet Take 25 mg by mouth daily.   Cholecalciferol (VITAMIN D3) 25 MCG (1000 UT) CAPS Take 1,000 Units by mouth daily.   ferrous sulfate 325 (65 FE) MG tablet Take 325 mg by mouth 2 (two) times daily with a meal.   fluticasone (FLONASE) 50 MCG/ACT nasal spray Place 2 sprays into both nostrils daily.   levothyroxine (SYNTHROID) 137 MCG tablet Take 1 tablet (137 mcg total) by mouth daily before breakfast.   nystatin (MYCOSTATIN/NYSTOP) powder Apply 1 application topically 3 (three) times daily.   nystatin-triamcinolone (MYCOLOG II) cream Apply to affected area twice daily   polymixin-bacitracin (POLYSPORIN) 500-10000 UNIT/GM OINT ointment Apply 1 application topically 2 (two) times daily.   pyridOXINE (VITAMIN B-6) 100 MG tablet Take 100 mg by mouth daily.   sulfamethoxazole-trimethoprim (BACTRIM DS) 800-160 MG tablet Take 1 tablet by mouth 2  (two) times daily.   tiZANidine (ZANAFLEX) 2 MG tablet Take 1-2 tablets (2-4 mg total) by mouth every 6 (six) hours as needed for muscle spasms.  triamcinolone cream (KENALOG) 0.1 % Apply 1 application topically 2 (two) times daily.   vitamin B-12 (CYANOCOBALAMIN) 500 MCG tablet Take 1 tablet (500 mcg total) by mouth daily.   [DISCONTINUED] amLODipine (NORVASC) 5 MG tablet Take 1 tablet (5 mg total) by mouth daily. (Patient not taking: Reported on 06/21/2020)   No facility-administered encounter medications on file as of 02/12/2021.    Follow-up: No follow-ups on file.   Becky Sax, MD

## 2021-02-13 ENCOUNTER — Other Ambulatory Visit: Payer: Self-pay | Admitting: Family

## 2021-02-13 DIAGNOSIS — E039 Hypothyroidism, unspecified: Secondary | ICD-10-CM

## 2021-02-16 LAB — URINE CULTURE

## 2021-02-28 ENCOUNTER — Other Ambulatory Visit: Payer: Self-pay | Admitting: *Deleted

## 2021-02-28 ENCOUNTER — Telehealth: Payer: Self-pay | Admitting: Family Medicine

## 2021-02-28 DIAGNOSIS — E039 Hypothyroidism, unspecified: Secondary | ICD-10-CM

## 2021-02-28 MED ORDER — LEVOTHYROXINE SODIUM 137 MCG PO TABS: 137.0000 ug | ORAL_TABLET | Freq: Every day | ORAL | 0 refills | Status: DC

## 2021-02-28 NOTE — Telephone Encounter (Signed)
URGENT REFILL   levothyroxine (SYNTHROID) 137 MCG tablet [[id number]]     Pharmacy  [id number] [address], [address]  [place], [place] [address]  Phone:  [phone]  Fax:  [phone]

## 2021-03-27 ENCOUNTER — Encounter: Payer: Self-pay | Admitting: Family Medicine

## 2021-03-27 ENCOUNTER — Ambulatory Visit (INDEPENDENT_AMBULATORY_CARE_PROVIDER_SITE_OTHER): Payer: Medicare PPO | Admitting: Family Medicine

## 2021-03-27 VITALS — BP 150/101 | HR 78 | Temp 98.0°F | Resp 16 | Wt 197.2 lb

## 2021-03-27 DIAGNOSIS — Z130 Encounter for screening for diseases of the blood and blood-forming organs and certain disorders involving the immune mechanism: Secondary | ICD-10-CM

## 2021-03-27 DIAGNOSIS — Z780 Asymptomatic menopausal state: Secondary | ICD-10-CM

## 2021-03-27 DIAGNOSIS — Z1211 Encounter for screening for malignant neoplasm of colon: Secondary | ICD-10-CM

## 2021-03-27 DIAGNOSIS — Z0001 Encounter for general adult medical examination with abnormal findings: Secondary | ICD-10-CM

## 2021-03-27 DIAGNOSIS — Z0000 Encounter for general adult medical examination without abnormal findings: Secondary | ICD-10-CM

## 2021-03-27 NOTE — Progress Notes (Signed)
Patient is here for CPE. Patient is asking about getting a test to see if you have cancer.

## 2021-03-27 NOTE — Progress Notes (Signed)
Established Patient Office Visit  Subjective:  Patient ID: Kayla [name], female    DOB: 07-06-1945  Age: 75 y.o. MRN: [id number]  CC:  Chief Complaint  Patient presents with   Annual Exam    HPI Kayla [name] presents for routine annual exam. Patient reports she is anxious that she may have cancer or some disease and that she needs to find out everything for her health because she has grandchildren that she is taking care of.   Past Medical History:  Diagnosis Date   Anemia    Anxiety    Asthma    B12 deficiency    Bursitis    Chronic LBP    Chronic UTI    Diastolic dysfunction    Fibromyalgia    GERD (gastroesophageal reflux disease)    History of low potassium    Hyperlipemia    Hypertension    Hypothyroid    IBS (irritable bowel syndrome)    Inflammatory osteoarthritis    Kidney infection    Psoriatic arthritis ([place])    S/P cardiac cath 11/30/2007   normal coronaries - Dr. Burt Knack (Dr. Debara Pickett reviewed films on 01/10/2016)    Past Surgical History:  Procedure Laterality Date   CESAREAN SECTION  06/10/1975   CHOLECYSTECTOMY     LUMBAR LAMINECTOMY/DECOMPRESSION MICRODISCECTOMY Right 05/25/2020   Procedure: MICRODISCECTOMY Right Lumbar three - four;  Surgeon: Newman Pies, MD;  Location: [place];  Service: Neurosurgery;  Laterality: Right;   NECK SURGERY  1998   RADIOLOGY WITH ANESTHESIA N/A 05/16/2020   Procedure: MRI LUMBAR SPINE W/CONTRAST;  Surgeon: Radiologist, Medication, MD;  Location: [place];  Service: Radiology;  Laterality: N/A;   repair broken C6 & C7      Family History  Problem Relation Age of Onset   Heart attack Father 28   Lung cancer Other        uncle, non smoker   Leukemia Other        uncle   Cancer Mother        oral   Stroke Mother    Alzheimer's disease Mother        at 28   Cancer Maternal Aunt        breast   Alcohol abuse Maternal Aunt    Alcohol abuse Maternal Uncle    Cancer Maternal Uncle        lung, stomach, oral   Cancer  Paternal Uncle        lung   Congestive Heart Failure Maternal Grandfather    Heart disease Maternal Grandfather    Heart attack Paternal Grandfather    Cancer Paternal Uncle        bone   Brain cancer Maternal Grandmother    Alzheimer's disease Other        maternal great grandmother    Colon cancer Neg Hx    Breast cancer Neg Hx    Diabetes Neg Hx     Social History   Socioeconomic History   Marital status: Widowed    Spouse name: Not on file   Number of children: 2   Years of education: Not on file   Highest education level: Bachelor's degree (e.g., BA, AB, BS)  Occupational History   Not on file  Tobacco Use   Smoking status: Never   Smokeless tobacco: Never  Vaping Use   Vaping Use: Never used  Substance and Sexual Activity   Alcohol use: No   Drug use: No   Sexual  activity: Not on file  Other Topics Concern   Not on file  Social History Narrative   Lost husband 2009, lost mother 2016      Lives at home (son & 3 grandchildren live with her)   Caffeine: 10-12 oz daily   Social Determinants of Health   Financial Resource Strain: Not on file  Food Insecurity: Not on file  Transportation Needs: Not on file  Physical Activity: Not on file  Stress: Not on file  Social Connections: Not on file  Intimate Partner Violence: Not on file    ROS Review of Systems  Psychiatric/Behavioral:  The patient is nervous/anxious.   All other systems reviewed and are negative.  Objective:   Today's Vitals: BP (!) 150/101    Pulse 78    Temp 98 F (36.7 C) (Oral)    Resp 16    Wt 197 lb 3.2 oz (89.4 kg)    SpO2 98%    BMI 33.85 kg/m   Physical Exam Vitals and nursing note reviewed.  Constitutional:      General: She is not in acute distress. HENT:     Head: Normocephalic and atraumatic.     Right Ear: Tympanic membrane, ear canal and external ear normal.     Left Ear: Tympanic membrane, ear canal and external ear normal.     Nose: Nose normal.     Mouth/Throat:      Mouth: Mucous membranes are moist.     Pharynx: Oropharynx is clear.  Eyes:     Conjunctiva/sclera: Conjunctivae normal.     Pupils: Pupils are equal, round, and reactive to light.  Neck:     Thyroid: No thyromegaly.  Cardiovascular:     Rate and Rhythm: Normal rate and regular rhythm.     Heart sounds: Normal heart sounds. No murmur heard. Pulmonary:     Effort: Pulmonary effort is normal. No respiratory distress.     Breath sounds: Normal breath sounds.  Abdominal:     General: There is no distension.     Palpations: Abdomen is soft. There is no mass.     Tenderness: There is no abdominal tenderness.  Musculoskeletal:        General: Normal range of motion.     Cervical back: Normal range of motion and neck supple.  Skin:    General: Skin is warm and dry.  Neurological:     General: No focal deficit present.     Mental Status: She is alert and oriented to person, place, and time.  Psychiatric:        Mood and Affect: Mood is anxious.        Behavior: Behavior normal.    Assessment & Plan:    1. Annual physical exam  - CMP14+EGFR - Lipid Panel  2. Screening for colon cancer  - Ambulatory referral to Gastroenterology  3. Screening for deficiency anemia  - CBC with Differential  4. Screening for endocrine/metabolic/immunity disorders  - TSH - T4, Free  5. Encounter for osteoporosis screening in asymptomatic postmenopausal patient  - DG Bone Density; Future    Outpatient Encounter Medications as of 03/27/2021  Medication Sig   acetaminophen (TYLENOL) 500 MG tablet Take 1,000 mg by mouth in the morning, at noon, in the evening, and at bedtime.   Ascorbic Acid (VITAMIN C) 1000 MG tablet Take 500 mg by mouth daily.   Calcium-Magnesium-Zinc (CAL-MAG-ZINC PO) Take 1 tablet by mouth 2 (two) times daily.   cetirizine (ZYRTEC ALLERGY) 10  MG tablet Take 1 tablet (10 mg total) by mouth daily.   chlorthalidone (HYGROTON) 25 MG tablet Take 25 mg by mouth daily.    Cholecalciferol (VITAMIN D3) 25 MCG (1000 UT) CAPS Take 1,000 Units by mouth daily.   ferrous sulfate 325 (65 FE) MG tablet Take 325 mg by mouth 2 (two) times daily with a meal.   fluticasone (FLONASE) 50 MCG/ACT nasal spray Place 2 sprays into both nostrils daily.   levothyroxine (SYNTHROID) 137 MCG tablet Take 1 tablet (137 mcg total) by mouth daily before breakfast.   nystatin (MYCOSTATIN/NYSTOP) powder Apply 1 application topically 3 (three) times daily.   nystatin-triamcinolone (MYCOLOG II) cream Apply to affected area twice daily   polymixin-bacitracin (POLYSPORIN) 500-10000 UNIT/GM OINT ointment Apply 1 application topically 2 (two) times daily.   pyridOXINE (VITAMIN B-6) 100 MG tablet Take 100 mg by mouth daily.   sulfamethoxazole-trimethoprim (BACTRIM DS) 800-160 MG tablet Take 1 tablet by mouth 2 (two) times daily.   tiZANidine (ZANAFLEX) 2 MG tablet Take 1-2 tablets (2-4 mg total) by mouth every 6 (six) hours as needed for muscle spasms.   triamcinolone cream (KENALOG) 0.1 % Apply 1 application topically 2 (two) times daily.   vitamin B-12 (CYANOCOBALAMIN) 500 MCG tablet Take 1 tablet (500 mcg total) by mouth daily.   [DISCONTINUED] amLODipine (NORVASC) 5 MG tablet Take 1 tablet (5 mg total) by mouth daily. (Patient not taking: Reported on 06/21/2020)   No facility-administered encounter medications on file as of 03/27/2021.    Follow-up: Return for follow up.   Becky Sax, MD

## 2021-03-28 ENCOUNTER — Encounter: Payer: Self-pay | Admitting: Family Medicine

## 2021-03-28 ENCOUNTER — Other Ambulatory Visit: Payer: Self-pay | Admitting: Family Medicine

## 2021-03-28 DIAGNOSIS — Z1329 Encounter for screening for other suspected endocrine disorder: Secondary | ICD-10-CM

## 2021-03-28 LAB — CMP14+EGFR
ALT: 10 IU/L (ref 0–32)
AST: 16 IU/L (ref 0–40)
Albumin/Globulin Ratio: 1.7 (ref 1.2–2.2)
Albumin: 4.3 g/dL (ref 3.7–4.7)
Alkaline Phosphatase: 134 IU/L — ABNORMAL HIGH (ref 44–121)
BUN/Creatinine Ratio: 11 — ABNORMAL LOW (ref 12–28)
BUN: 10 mg/dL (ref 8–27)
Bilirubin Total: 0.3 mg/dL (ref 0.0–1.2)
CO2: 21 mmol/L (ref 20–29)
Calcium: 9.7 mg/dL (ref 8.7–10.3)
Chloride: 108 mmol/L — ABNORMAL HIGH (ref 96–106)
Creatinine, Ser: 0.89 mg/dL (ref 0.57–1.00)
Globulin, Total: 2.5 g/dL (ref 1.5–4.5)
Glucose: 99 mg/dL (ref 70–99)
Potassium: 4.1 mmol/L (ref 3.5–5.2)
Sodium: 143 mmol/L (ref 134–144)
Total Protein: 6.8 g/dL (ref 6.0–8.5)
eGFR: 68 mL/min/{1.73_m2} (ref >59)

## 2021-03-28 LAB — CBC WITH DIFFERENTIAL/PLATELET
Basophils Absolute: 0.1 10*3/uL (ref 0.0–0.2)
Basos: 1 %
EOS (ABSOLUTE): 0.2 10*3/uL (ref 0.0–0.4)
Eos: 2 %
Hematocrit: 44.5 % (ref 34.0–46.6)
Hemoglobin: 15 g/dL (ref 11.1–15.9)
Immature Grans (Abs): 0 10*3/uL (ref 0.0–0.1)
Immature Granulocytes: 0 %
Lymphocytes Absolute: 3.4 10*3/uL — ABNORMAL HIGH (ref 0.7–3.1)
Lymphs: 41 %
MCH: 31.8 pg (ref 26.6–33.0)
MCHC: 33.7 g/dL (ref 31.5–35.7)
MCV: 94 fL (ref 79–97)
Monocytes Absolute: 0.7 10*3/uL (ref 0.1–0.9)
Monocytes: 8 %
Neutrophils Absolute: 4 10*3/uL (ref 1.4–7.0)
Neutrophils: 48 %
Platelets: 303 10*3/uL (ref 150–450)
RBC: 4.72 x10E6/uL (ref 3.77–5.28)
RDW: 12 % (ref 11.7–15.4)
WBC: 8.3 10*3/uL (ref 3.4–10.8)

## 2021-03-28 LAB — LIPID PANEL
Chol/HDL Ratio: 3.9 ratio (ref 0.0–4.4)
Cholesterol, Total: 201 mg/dL — ABNORMAL HIGH (ref 100–199)
HDL: 52 mg/dL (ref >39)
LDL Chol Calc (NIH): 111 mg/dL — ABNORMAL HIGH (ref 0–99)
Triglycerides: 223 mg/dL — ABNORMAL HIGH (ref 0–149)
VLDL Cholesterol Cal: 38 mg/dL (ref 5–40)

## 2021-03-28 LAB — TSH: TSH: 7.95 u[IU]/mL — ABNORMAL HIGH (ref 0.450–4.500)

## 2021-03-28 LAB — T4, FREE: Free T4: 1.36 ng/dL (ref 0.82–1.77)

## 2021-03-29 NOTE — Progress Notes (Signed)
Patient MB was full  Not able to leave msg

## 2021-04-02 NOTE — Progress Notes (Signed)
Erroneous encounter

## 2021-04-04 ENCOUNTER — Emergency Department (HOSPITAL_COMMUNITY): Payer: Medicare PPO

## 2021-04-04 ENCOUNTER — Other Ambulatory Visit: Payer: Self-pay | Admitting: *Deleted

## 2021-04-04 ENCOUNTER — Encounter (HOSPITAL_COMMUNITY): Payer: Self-pay

## 2021-04-04 ENCOUNTER — Telehealth: Payer: Self-pay | Admitting: Family Medicine

## 2021-04-04 ENCOUNTER — Encounter: Payer: Medicare PPO | Admitting: Family

## 2021-04-04 ENCOUNTER — Emergency Department (HOSPITAL_COMMUNITY)
Admission: EM | Admit: 2021-04-04 | Discharge: 2021-04-04 | Disposition: A | Discharge status: Home / Self Care | Payer: Medicare PPO | Attending: Emergency Medicine | Admitting: Emergency Medicine

## 2021-04-04 ENCOUNTER — Other Ambulatory Visit: Payer: Self-pay

## 2021-04-04 DIAGNOSIS — J45909 Unspecified asthma, uncomplicated: Secondary | ICD-10-CM | POA: Diagnosis not present

## 2021-04-04 DIAGNOSIS — Z8616 Personal history of COVID-19: Secondary | ICD-10-CM | POA: Insufficient documentation

## 2021-04-04 DIAGNOSIS — Z79899 Other long term (current) drug therapy: Secondary | ICD-10-CM | POA: Insufficient documentation

## 2021-04-04 DIAGNOSIS — R079 Chest pain, unspecified: Secondary | ICD-10-CM | POA: Diagnosis present

## 2021-04-04 DIAGNOSIS — E039 Hypothyroidism, unspecified: Secondary | ICD-10-CM | POA: Insufficient documentation

## 2021-04-04 DIAGNOSIS — I10 Essential (primary) hypertension: Secondary | ICD-10-CM | POA: Insufficient documentation

## 2021-04-04 DIAGNOSIS — R519 Headache, unspecified: Secondary | ICD-10-CM | POA: Diagnosis not present

## 2021-04-04 DIAGNOSIS — I2510 Atherosclerotic heart disease of native coronary artery without angina pectoris: Secondary | ICD-10-CM | POA: Diagnosis not present

## 2021-04-04 DIAGNOSIS — E538 Deficiency of other specified B group vitamins: Secondary | ICD-10-CM

## 2021-04-04 LAB — COMPREHENSIVE METABOLIC PANEL
ALT: 13 U/L (ref 0–44)
AST: 17 U/L (ref 15–41)
Albumin: 3.4 g/dL — ABNORMAL LOW (ref 3.5–5.0)
Alkaline Phosphatase: 90 U/L (ref 38–126)
Anion gap: 5 (ref 5–15)
BUN: 11 mg/dL (ref 8–23)
CO2: 24 mmol/L (ref 22–32)
Calcium: 9 mg/dL (ref 8.9–10.3)
Chloride: 111 mmol/L (ref 98–111)
Creatinine, Ser: 1.01 mg/dL — ABNORMAL HIGH (ref 0.44–1.00)
GFR, Estimated: 58 mL/min — ABNORMAL LOW (ref >60)
Glucose, Bld: 109 mg/dL — ABNORMAL HIGH (ref 70–99)
Potassium: 3.8 mmol/L (ref 3.5–5.1)
Sodium: 140 mmol/L (ref 135–145)
Total Bilirubin: 0.5 mg/dL (ref 0.3–1.2)
Total Protein: 6 g/dL — ABNORMAL LOW (ref 6.5–8.1)

## 2021-04-04 LAB — CBC WITH DIFFERENTIAL/PLATELET
Abs Immature Granulocytes: 0.02 10*3/uL (ref 0.00–0.07)
Basophils Absolute: 0.1 10*3/uL (ref 0.0–0.1)
Basophils Relative: 1 %
Eosinophils Absolute: 0.2 10*3/uL (ref 0.0–0.5)
Eosinophils Relative: 2 %
HCT: 41.5 % (ref 36.0–46.0)
Hemoglobin: 13.7 g/dL (ref 12.0–15.0)
Immature Granulocytes: 0 %
Lymphocytes Relative: 34 %
Lymphs Abs: 2.7 10*3/uL (ref 0.7–4.0)
MCH: 32.5 pg (ref 26.0–34.0)
MCHC: 33 g/dL (ref 30.0–36.0)
MCV: 98.3 fL (ref 80.0–100.0)
Monocytes Absolute: 0.7 10*3/uL (ref 0.1–1.0)
Monocytes Relative: 8 %
Neutro Abs: 4.3 10*3/uL (ref 1.7–7.7)
Neutrophils Relative %: 55 %
Platelets: 265 10*3/uL (ref 150–400)
RBC: 4.22 MIL/uL (ref 3.87–5.11)
RDW: 12.6 % (ref 11.5–15.5)
WBC: 7.8 10*3/uL (ref 4.0–10.5)
nRBC: 0 % (ref 0.0–0.2)

## 2021-04-04 LAB — TROPONIN I (HIGH SENSITIVITY)
Troponin I (High Sensitivity): 7 ng/L (ref <18)
Troponin I (High Sensitivity): 6 ng/L (ref <18)

## 2021-04-04 MED ORDER — AMLODIPINE BESYLATE 5 MG PO TABS
5.0000 mg | ORAL_TABLET | Freq: Once | ORAL | Status: AC
  Administered 2021-04-04: 23:00:00 5 mg via ORAL
  Filled 2021-04-04: qty 1

## 2021-04-04 MED ORDER — CYANOCOBALAMIN 500 MCG PO TABS: 500.0000 ug | ORAL_TABLET | Freq: Every day | ORAL | 0 refills | Status: DC

## 2021-04-04 MED ORDER — METOCLOPRAMIDE HCL 5 MG/ML IJ SOLN
10.0000 mg | Freq: Once | INTRAMUSCULAR | Status: AC
  Administered 2021-04-04: 21:00:00 10 mg via INTRAVENOUS
  Filled 2021-04-04: qty 2

## 2021-04-04 MED ORDER — DIPHENHYDRAMINE HCL 50 MG/ML IJ SOLN
25.0000 mg | Freq: Once | INTRAMUSCULAR | Status: AC
  Administered 2021-04-04: 21:00:00 25 mg via INTRAVENOUS
  Filled 2021-04-04: qty 1

## 2021-04-04 MED ORDER — AMLODIPINE BESYLATE 5 MG PO TABS: 5.0000 mg | ORAL_TABLET | Freq: Every day | ORAL | 0 refills | Status: DC

## 2021-04-04 NOTE — ED Notes (Signed)
Patient transported to CT 

## 2021-04-04 NOTE — ED Notes (Addendum)
Patient is requesting follow-up with cardiologist in regards to her elevated blood pressure she took initially. Pressure has come down since she arrived and is being monitored.

## 2021-04-04 NOTE — Progress Notes (Signed)
Patient came in office today and her lab results were given to her

## 2021-04-04 NOTE — Telephone Encounter (Signed)
vitamin B-12 (CYANOCOBALAMIN) 500 MCG tablet [[id number]]   Pharmacy  [id number] [address], [address]  [place], [place] [address]  Phone:  [phone]  Fax:  [phone]

## 2021-04-04 NOTE — Discharge Instructions (Addendum)
Your blood pressure has come down after you were given some headache medicine.  Your blood pressure may be elevated because you are under a lot of stress  Take norvasc daily to keep your blood pressure under control.   Please see cardiology for follow-up.  You likely will need a stress test  Please see your doctor next week for repeat blood pressure check  Return to ER if you have worse chest pain, blood pressure over 200, worse headache, dizziness.

## 2021-04-04 NOTE — ED Triage Notes (Signed)
Pt BIB [facility] after son [place] pt [place] fire [facility]. Pt c/o headache, L sided CP radiating to L jaw/shoulder, and son noted the pt having a few moments of confusion. EMS gave 4 aspirin 1 nitro and relieved CP. Pt A&Ox4.

## 2021-04-04 NOTE — ED Provider Notes (Signed)
Blood pressure (!) 144/90, pulse 79, temperature 98.3 F (36.8 C), temperature source Oral, resp. rate 19, SpO2 98 %.  Assuming care from Dr. Darl Householder.  In short, Kayla [name] is a 75 y.o. female with a chief complaint of No chief complaint on file. Marland Kitchen  Refer to the original H&P for additional details.  The current plan of care is to follow up on repeat troponin. Consider Cardiology referral and d/c if troponin remains flat.  Patient ultimately discharged by Dr. Darl Householder.     Margette Fast, MD 04/04/21 [phone]

## 2021-04-04 NOTE — ED Notes (Signed)
Patient verbalizes understanding of d/c instructions. Opportunities for questions and answers were provided. Pt d/c from ED and wheeled to lobby where son is picking her up

## 2021-04-04 NOTE — ED Provider Notes (Signed)
[facility] EMERGENCY DEPARTMENT Provider Note   CSN: [id number] Arrival date & time: 04/04/21  2012     History No chief complaint on file.   Kayla [name] is a 75 y.o. female hx of fibromyalgia, HTN, here presenting with hypertension.  Patient states that she has been under a lot of stress recently.  She states that her Joslyn Hy is not doing well and might die anytime and her dog is in the process dying as well.  She states that she is also under a lot of financial stress especially with her car.  Patient states that she went to her primary care doctor today and she was upset with the doctor.  She then went to [facility] to check her blood pressure and it was elevated at 200/100.  She then called 911 and went to the fire station and recheck her blood pressure and it was elevated as well.  She also has been having headache for the last week or so.  She told him she had some chest pressure.  She was given 4 aspirin and 1 nitro and chest pressure relieved.  She states that she has no personal history of CAD but multiple family members died of MI.   The history is provided by the patient.      Past Medical History:  Diagnosis Date   Anemia    Anxiety    Asthma    B12 deficiency    Bursitis    Chronic LBP    Chronic UTI    Diastolic dysfunction    Fibromyalgia    GERD (gastroesophageal reflux disease)    History of low potassium    Hyperlipemia    Hypertension    Hypothyroid    IBS (irritable bowel syndrome)    Inflammatory osteoarthritis    Kidney infection    Psoriatic arthritis (HCC)    S/P cardiac cath 11/30/2007   normal coronaries - Dr. Burt Knack (Dr. Debara Pickett reviewed films on 01/10/2016)    Patient Active Problem List   Diagnosis Date Noted   Personal history of COVID-19 08/24/2020   Body mass index (BMI) 33.0-33.9, adult 06/20/2020   Status post lumbar microdiscectomy 06/20/2020   Essential (primary) hypertension 06/20/2020   Bilateral leg edema 06/17/2020    Cellulitis of leg, left 06/17/2020   Cellulitis of leg, right 06/17/2020   Lumbar herniated disc 05/25/2020   Hyponatremia 05/12/2020   Sciatica 05/12/2020   COVID-19 05/09/2020   Hypokalemia 05/04/2020   Dysphagia 04/17/2020   Renal insufficiency 12/29/2018   Cyst of left ovary 12/29/2018   Esophageal dysmotility 12/29/2018   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/29/2018   Acquired hypothyroidism 12/29/2018   Chronic maxillary sinusitis 07/07/2018   Snoring 06/01/2018   Costochondritis 06/01/2018   Family history of heart disease 06/01/2018   Class 2 severe obesity due to excess calories with serious comorbidity and body mass index (BMI) of 38.0 to 38.9 in adult ([place]) 06/01/2018   Multiple atypical skin moles 04/28/2017   Depression, recurrent ([place]) 07/16/2016   Adjustment disorder with mixed anxiety and depressed mood 01/15/2016   Fe Def Anemia 12/22/2015   Scoliosis (and kyphoscoliosis), idiopathic 12/20/2015   Osteopenia 12/20/2015   Fibromyalgia 08/02/2014   GAD (generalized anxiety disorder) 09/17/2012   Vitamin D deficiency 01/24/2012   Fatigue 10/14/2011   B12 deficiency    Hyperlipidemia 11/30/2007   Coronary artery disease involving native heart without angina pectoris 11/30/2007   Essential hypertension 01/27/2007   GERD 01/27/2007  IBS 01/27/2007    Past Surgical History:  Procedure Laterality Date   CESAREAN SECTION  06/10/1975   CHOLECYSTECTOMY     LUMBAR LAMINECTOMY/DECOMPRESSION MICRODISCECTOMY Right 05/25/2020   Procedure: MICRODISCECTOMY Right Lumbar three - four;  Surgeon: Newman Pies, MD;  Location: [place];  Service: Neurosurgery;  Laterality: Right;   NECK SURGERY  1998   RADIOLOGY WITH ANESTHESIA N/A 05/16/2020   Procedure: MRI LUMBAR SPINE W/CONTRAST;  Surgeon: Radiologist, Medication, MD;  Location: [place];  Service: Radiology;  Laterality: N/A;   repair broken C6 & C7       OB History   No obstetric history on file.      Family History  Problem Relation Age of Onset   Heart attack Father 22   Lung cancer Other        uncle, non smoker   Leukemia Other        uncle   Cancer Mother        oral   Stroke Mother    Alzheimer's disease Mother        at 6   Cancer Maternal Aunt        breast   Alcohol abuse Maternal Aunt    Alcohol abuse Maternal Uncle    Cancer Maternal Uncle        lung, stomach, oral   Cancer Paternal Uncle        lung   Congestive Heart Failure Maternal Grandfather    Heart disease Maternal Grandfather    Heart attack Paternal Grandfather    Cancer Paternal Uncle        bone   Brain cancer Maternal Grandmother    Alzheimer's disease Other        maternal great grandmother    Colon cancer Neg Hx    Breast cancer Neg Hx    Diabetes Neg Hx     Social History   Tobacco Use   Smoking status: Never   Smokeless tobacco: Never  Vaping Use   Vaping Use: Never used  Substance Use Topics   Alcohol use: No   Drug use: No    Home Medications Prior to Admission medications   Medication Sig Start Date End Date Taking? Authorizing Provider  acetaminophen (TYLENOL) 500 MG tablet Take 1,000 mg by mouth in the morning, at noon, in the evening, and at bedtime.    [provider]  Ascorbic Acid (VITAMIN C) 1000 MG tablet Take 500 mg by mouth daily.    [provider]  Calcium-Magnesium-Zinc (CAL-MAG-ZINC PO) Take 1 tablet by mouth 2 (two) times daily.    [provider]  cetirizine (ZYRTEC ALLERGY) 10 MG tablet Take 1 tablet (10 mg total) by mouth daily. 10/20/20   Jaynee Eagles, PA-C  chlorthalidone (HYGROTON) 25 MG tablet Take 25 mg by mouth daily. 06/07/20   [provider]  Cholecalciferol (VITAMIN D3) 25 MCG (1000 UT) CAPS Take 1,000 Units by mouth daily.    [provider]  ferrous sulfate 325 (65 FE) MG tablet Take 325 mg by mouth 2 (two) times daily with a meal.    [provider]  fluticasone (FLONASE) 50 MCG/ACT nasal  spray Place 2 sprays into both nostrils daily. 10/20/20   Jaynee Eagles, PA-C  levothyroxine (SYNTHROID) 137 MCG tablet Take 1 tablet (137 mcg total) by mouth daily before breakfast. 02/28/21   Dorna Mai, MD  nystatin (MYCOSTATIN/NYSTOP) powder Apply 1 application topically 3 (three) times daily. 07/27/20   Wieters, Elesa Hacker, PA-C  nystatin-triamcinolone (MYCOLOG II) cream Apply to affected area twice daily 07/27/20   Wieters, [employer] C, PA-C  polymixin-bacitracin (POLYSPORIN) 500-10000 UNIT/GM OINT ointment Apply 1 application topically 2 (two) times daily. 07/12/20   LampteyMyrene Galas, MD  pyridOXINE (VITAMIN B-6) 100 MG tablet Take 100 mg by mouth daily.    [provider]  sulfamethoxazole-trimethoprim (BACTRIM DS) 800-160 MG tablet Take 1 tablet by mouth 2 (two) times daily. 02/12/21   Dorna Mai, MD  tiZANidine (ZANAFLEX) 2 MG tablet Take 1-2 tablets (2-4 mg total) by mouth every 6 (six) hours as needed for muscle spasms. 12/06/20   Camillia Herter, NP  triamcinolone cream (KENALOG) 0.1 % Apply 1 application topically 2 (two) times daily. 09/12/20   Nicolette Bang, MD  vitamin B-12 (CYANOCOBALAMIN) 500 MCG tablet Take 1 tablet (500 mcg total) by mouth daily. 04/04/21   Dorna Mai, MD  amLODipine (NORVASC) 5 MG tablet Take 1 tablet (5 mg total) by mouth daily. Patient not taking: Reported on 06/21/2020 05/30/20 07/12/20  Antonieta Pert, MD    Allergies    Ace inhibitors and Lisinopril  Review of Systems   Review of Systems  Cardiovascular:  Positive for chest pain.  Neurological:  Positive for headaches.  All other systems reviewed and are negative.  Physical Exam Updated Vital Signs BP (!) 144/90    Pulse 79    Temp 98.3 F (36.8 C) (Oral)    Resp 19    SpO2 98%   Physical Exam Vitals and nursing note reviewed.  Constitutional:      Comments: Upset   HENT:     Head: Normocephalic.     Nose: Nose normal.     Mouth/Throat:     Mouth: Mucous membranes are moist.   Eyes:     Extraocular Movements: Extraocular movements intact.     Pupils: Pupils are equal, round, and reactive to light.  Cardiovascular:     Rate and Rhythm: Normal rate and regular rhythm.     Pulses: Normal pulses.     Heart sounds: Normal heart sounds.  Pulmonary:     Effort: Pulmonary effort is normal.     Breath sounds: Normal breath sounds.  Abdominal:     General: Abdomen is flat.     Palpations: Abdomen is soft.  Musculoskeletal:        General: Normal range of motion.     Cervical back: Normal range of motion and neck supple.  Skin:    General: Skin is warm.     Capillary Refill: Capillary refill takes less than 2 seconds.  Neurological:     General: No focal deficit present.     Mental Status: She is oriented to person, place, and time.  Psychiatric:        Mood and Affect: Mood normal.        Behavior: Behavior normal.    ED Results / Procedures / Treatments   Labs (all labs ordered are listed, but only abnormal results are displayed) Labs Reviewed  COMPREHENSIVE METABOLIC PANEL - Abnormal; Notable for the following components:      Result Value   Glucose, Bld 109 (*)    Creatinine, Ser 1.01 (*)    Total Protein 6.0 (*)    Albumin 3.4 (*)    GFR, Estimated 58 (*)    All other components within normal limits  CBC WITH DIFFERENTIAL/PLATELET  TROPONIN I (HIGH SENSITIVITY)  TROPONIN I (HIGH SENSITIVITY)    EKG EKG Interpretation  Date/Time:  Wednesday April 04 2021 20:23:03 EST Ventricular Rate:  83 PR Interval:  174 QRS Duration: 92 QT Interval:  386 QTC Calculation: 056 R Axis:   41 Text Interpretation: Sinus rhythm Abnormal R-wave progression, early transition No significant change since last tracing Confirmed by Wandra Arthurs [phone]) on 04/04/2021 8:36:39 PM  Radiology CT HEAD WO CONTRAST (5MM)  Result Date: 04/04/2021 CLINICAL DATA:  Headache EXAM: CT HEAD WITHOUT CONTRAST TECHNIQUE: Contiguous axial images were obtained from the base of  the skull through the vertex without intravenous contrast. COMPARISON:  None. FINDINGS: Brain: No acute territorial infarction, hemorrhage or intracranial mass. Patchy white matter hypodensity consistent with chronic small vessel ischemic change. Nonenlarged ventricles. Vascular: No hyperdense vessels.  Carotid vascular calcification Skull: Normal. Negative for fracture or focal lesion. Sinuses/Orbits: No acute finding. Other: None IMPRESSION: 1. No CT evidence for acute intracranial abnormality. 2. Mild chronic small vessel ischemic changes of the white matter Electronically Signed   By: Donavan Foil M.D.   On: 04/04/2021 21:17   DG Chest Port 1 View  Result Date: 04/04/2021 CLINICAL DATA:  Chest pain EXAM: PORTABLE CHEST 1 VIEW COMPARISON:  05/12/2020 FINDINGS: Mild cardiomegaly. No focal opacity or pleural effusion. No pneumothorax. Hardware in the spine. IMPRESSION: No active disease.  Mild cardiomegaly. Electronically Signed   By: Donavan Foil M.D.   On: 04/04/2021 20:42    Procedures Procedures   Medications Ordered in ED Medications  amLODipine (NORVASC) tablet 5 mg (has no administration in time range)  metoCLOPramide (REGLAN) injection 10 mg (10 mg Intravenous Given 04/04/21 2038)  diphenhydrAMINE (BENADRYL) injection 25 mg (25 mg Intravenous Given 04/04/21 2039)    ED Course  I have reviewed the triage vital signs and the nursing notes.  Pertinent labs & imaging results that were available during my care of the patient were reviewed by me and considered in my medical decision making (see chart for details).    MDM Rules/Calculators/A&P                         Kayla [name] is a 75 y.o. female here presenting with chest pain and headache.  Headache for about a week and chest pain today.  Patient was upset today and blood pressure was elevated.  Patient states that she has a history of hypertension but her blood pressure was normal several months ago at PCP office so she was taken off  of blood pressure medicines.  Patient also had severe hypokalemia so was taken off of her diuretics.  I think she has a lot of stress and likely that is getting her blood pressure elevated.  We will get CT head to rule out a bleed.  We will also get CBC and CMP and troponin x2 and chest x-ray  11:16 PM Patient's labs are unremarkable.  Blood pressure down to 140s now.  Second troponin negative.  Patient was on Norvasc previously and we will put patient back on Norvasc.  Encouraged her to follow-up with PCP. I also told her that she will need to follow-up with cardiology for stress test given her age and risk factors      Final Clinical Impression(s) / ED Diagnoses Final diagnoses:  Chest pain, unspecified type  Hypertension, unspecified type    Rx / DC Orders ED Discharge Orders          Ordered    Ambulatory referral to Cardiology        04/04/21 2313  Drenda Freeze, MD 04/04/21 [phone]

## 2021-04-05 ENCOUNTER — Telehealth: Payer: Self-pay | Admitting: Cardiology

## 2021-04-05 NOTE — Telephone Encounter (Signed)
Consulted with DOD Dr. Oval Linsey, recommends patient continue on prescribed amlodipine and keep a log of her blood pressures.   RN will call patient and tell her to continue amlodipine as prescribed and have her son pick up a blood pressure cuff with her medication. Will encourage patient to keep a log of blood pressures 1-2 hours after taking medication and send [place] those numbers in 1 week.    Will also forward to Dr. Harrell Gave patients primary Cardiologist for further review.     Spoke with the patient and she will continue taking the medication. Patient endorses financial struggles with getting a blood pressure cuff. Will also route to social work to try to get some support for the patient. Lots of social stressors in her life on top of financial burden

## 2021-04-05 NOTE — Telephone Encounter (Addendum)
Called patient to speak about blood pressure readings.    198/103 and 180/129 last night- was seen in the ED yesterday  for high blood pressure after going to the fire station and having blood pressure checked. Pt. was given a prescription for Amlodipine 5mg . Took one dose last night. Currently waiting on son to bring home prescription. Patient states her plan is take another dose when her son gets home with the medicine. Patient has not been able to recheck her blood pressure today because her son has not been home. Patient had a headache last night and notes it is  "not a normal headache" . PT. 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 She states he leg pain is Unrelated. patient has a prior surgery. Patient concerned she has dysphagia. Discussed this with another provider at some point. Patient unsure of when this was.  Patient was speaking about an MRI that was ordered by a provider that she was unsure of. This does not seem to be related to the current blood pressure issues at hand, seems that patient is just unhappy with care from that doctor.

## 2021-04-05 NOTE — Telephone Encounter (Signed)
Pt c/o BP issue: STAT if pt c/o blurred vision, one-sided weakness or slurred speech  1. What are your last 5 BP readings? 198/103; 180/129  2. Are you having any other symptoms (ex. Dizziness, headache, blurred vision, passed out)? Headache; shooting pain up and down leg   3. What is your BP issue? Experiencing really high blood pressure

## 2021-04-06 ENCOUNTER — Telehealth: Payer: Self-pay | Admitting: Licensed Clinical Social Worker

## 2021-04-06 NOTE — Telephone Encounter (Signed)
LCSW attempted to reach pt at [phone]. No answer, voicemail full. Per DPR nobody else given permission for messages.  I will reattempt pt x2, if I continue to not be able to reach her I will attempt to reach her son listed on chart.   Westley Hummer, MSW, [place]  [phone]- work cell phone (preferred) [phone]- desk phone

## 2021-04-10 ENCOUNTER — Emergency Department (HOSPITAL_COMMUNITY)
Admission: EM | Admit: 2021-04-10 | Discharge: 2021-04-11 | Disposition: A | Discharge status: Home / Self Care | Payer: Medicare PPO | Attending: Emergency Medicine | Admitting: Emergency Medicine

## 2021-04-10 ENCOUNTER — Telehealth: Payer: Self-pay | Admitting: Licensed Clinical Social Worker

## 2021-04-10 DIAGNOSIS — F419 Anxiety disorder, unspecified: Secondary | ICD-10-CM | POA: Insufficient documentation

## 2021-04-10 DIAGNOSIS — R002 Palpitations: Secondary | ICD-10-CM | POA: Diagnosis not present

## 2021-04-10 DIAGNOSIS — R2243 Localized swelling, mass and lump, lower limb, bilateral: Secondary | ICD-10-CM | POA: Insufficient documentation

## 2021-04-10 DIAGNOSIS — Z79899 Other long term (current) drug therapy: Secondary | ICD-10-CM | POA: Insufficient documentation

## 2021-04-10 NOTE — Telephone Encounter (Signed)
Attempted to reach pt again this morning at listed preferred number [phone]. No answer, voicemail full at this time. I will re-attempt 1x more and then reach out to a family member to try and alert pt that I am attempting to reach her.  Westley Hummer, MSW, [place]  [phone]- work cell phone (preferred) [phone]- desk phone

## 2021-04-11 ENCOUNTER — Other Ambulatory Visit: Payer: Self-pay

## 2021-04-11 ENCOUNTER — Emergency Department (HOSPITAL_COMMUNITY): Payer: Medicare PPO

## 2021-04-11 ENCOUNTER — Encounter (HOSPITAL_COMMUNITY): Payer: Self-pay | Admitting: Emergency Medicine

## 2021-04-11 LAB — CBC WITH DIFFERENTIAL/PLATELET
Abs Immature Granulocytes: 0.02 10*3/uL (ref 0.00–0.07)
Basophils Absolute: 0.1 10*3/uL (ref 0.0–0.1)
Basophils Relative: 1 %
Eosinophils Absolute: 0.2 10*3/uL (ref 0.0–0.5)
Eosinophils Relative: 2 %
HCT: 44.2 % (ref 36.0–46.0)
Hemoglobin: 14.2 g/dL (ref 12.0–15.0)
Immature Granulocytes: 0 %
Lymphocytes Relative: 34 %
Lymphs Abs: 3.1 10*3/uL (ref 0.7–4.0)
MCH: 32 pg (ref 26.0–34.0)
MCHC: 32.1 g/dL (ref 30.0–36.0)
MCV: 99.5 fL (ref 80.0–100.0)
Monocytes Absolute: 0.8 10*3/uL (ref 0.1–1.0)
Monocytes Relative: 9 %
Neutro Abs: 5 10*3/uL (ref 1.7–7.7)
Neutrophils Relative %: 54 %
Platelets: 311 10*3/uL (ref 150–400)
RBC: 4.44 MIL/uL (ref 3.87–5.11)
RDW: 12.6 % (ref 11.5–15.5)
WBC: 9.1 10*3/uL (ref 4.0–10.5)
nRBC: 0 % (ref 0.0–0.2)

## 2021-04-11 LAB — BASIC METABOLIC PANEL
Anion gap: 7 (ref 5–15)
BUN: 10 mg/dL (ref 8–23)
CO2: 24 mmol/L (ref 22–32)
Calcium: 9.4 mg/dL (ref 8.9–10.3)
Chloride: 108 mmol/L (ref 98–111)
Creatinine, Ser: 0.89 mg/dL (ref 0.44–1.00)
GFR, Estimated: >60 mL/min (ref >60)
Glucose, Bld: 97 mg/dL (ref 70–99)
Potassium: 4 mmol/L (ref 3.5–5.1)
Sodium: 139 mmol/L (ref 135–145)

## 2021-04-11 LAB — TROPONIN I (HIGH SENSITIVITY)
Troponin I (High Sensitivity): 6 ng/L (ref <18)
Troponin I (High Sensitivity): 6 ng/L (ref <18)

## 2021-04-11 NOTE — Discharge Instructions (Addendum)
Avoid increased sodium intake.  Follow up with your Physician for recheck

## 2021-04-11 NOTE — ED Triage Notes (Signed)
Patient arrived with EMS from home reports palpitations and anxiety attack this evening , denies chest pain /respirations unlabored.

## 2021-04-11 NOTE — ED Provider Notes (Signed)
Emergency Medicine Provider Triage Evaluation Note  Kayla [name] , a 76 y.o. female  was evaluated in triage.  Pt complains of palpitations.  She had palpitations which caused her to come here.     Review of Systems  Positive: Palpitations, anxiety. Negative: Syncope  Physical Exam  BP (!) 148/94 (BP Location: Right Arm)    Pulse 79    Temp 98.4 F (36.9 C) (Oral)    Resp 18    Ht 5\' 4"  (1.626 m)    Wt 92 kg    SpO2 100%    BMI 34.81 kg/m  Gen:   Awake, no distress   Resp:  Normal effort  MSK:   Moves extremities without difficulty  Other:  Normal speech  Medical Decision Making  Medically screening exam initiated at 12:08 AM.  Appropriate orders placed.  [name] was informed that the remainder of the evaluation will be completed by another provider, this initial triage assessment does not replace that evaluation, and the importance of remaining in the ED until their evaluation is complete.     Lorin Glass, PA-C 04/12/21 0017    Fatima Blank, MD 04/13/21 [phone]

## 2021-04-12 ENCOUNTER — Telehealth: Payer: Self-pay | Admitting: Licensed Clinical Social Worker

## 2021-04-12 NOTE — Telephone Encounter (Signed)
Attempted to reach pt again this morning at listed preferred number [phone]. No answer, voicemail full at this time. I will reach out to a family member tomorrow to try and alert pt that I am attempting to reach her.   Westley Hummer, MSW, [place]  [phone]- work cell phone (preferred) [phone]- desk phone

## 2021-04-13 ENCOUNTER — Telehealth: Payer: Self-pay | Admitting: Licensed Clinical Social Worker

## 2021-04-13 NOTE — Telephone Encounter (Signed)
Patient calling back to follow up on her BP and getting a blood pressure cuff. She states she cannot find her BP readings that she had noted, because her grandchildren were over so she will have to look for it. She states she was seen in the ED again Tuesday. She states she had to get her BP checked at the fire station and they called an ambulance.

## 2021-04-13 NOTE — Telephone Encounter (Signed)
LCSW left HIPAA compliant message on [phone] which is listed as her home number and pt's sons number. Remain available.   Westley Hummer, MSW, [place]  [phone]- work cell phone (preferred) [phone]- desk phone

## 2021-04-16 NOTE — Telephone Encounter (Signed)
Social work has attempted to reach out to the patient. [facility] currently out of blood pressure cuffs.    "Hi Alisha,  I called her x3 and her son x1. Have not heard back from either of them.  I dont know if [facility] could order some additional cuffs for [facility]?  That way if she calls y'all back then she would at least be able to get that?   Westley Hummer, MSW, [place]  [phone]- work cell phone (preferred)  [phone]- desk phone "

## 2021-04-16 NOTE — ED Provider Notes (Signed)
[facility] EMERGENCY DEPARTMENT Provider Note   CSN: [id number] Arrival date & time: 04/10/21  2354     History  Chief Complaint  Patient presents with   Palpitations / Anxiety    Kayla [name] is a 76 y.o. female.  Pt complains of swelling in both lower legs.    The history is provided by the patient. No language interpreter was used.      Home Medications Prior to Admission medications   Medication Sig Start Date End Date Taking? Authorizing Provider  acetaminophen (TYLENOL) 500 MG tablet Take 1,000 mg by mouth in the morning, at noon, in the evening, and at bedtime.    [provider]  amLODipine (NORVASC) 5 MG tablet Take 1 tablet (5 mg total) by mouth daily. 04/04/21   Drenda Freeze, MD  Ascorbic Acid (VITAMIN C) 1000 MG tablet Take 500 mg by mouth daily.    [provider]  Calcium-Magnesium-Zinc (CAL-MAG-ZINC PO) Take 1 tablet by mouth 2 (two) times daily.    [provider]  cetirizine (ZYRTEC ALLERGY) 10 MG tablet Take 1 tablet (10 mg total) by mouth daily. 10/20/20   Jaynee Eagles, PA-C  chlorthalidone (HYGROTON) 25 MG tablet Take 25 mg by mouth daily. 06/07/20   [provider]  Cholecalciferol (VITAMIN D3) 25 MCG (1000 UT) CAPS Take 1,000 Units by mouth daily.    [provider]  ferrous sulfate 325 (65 FE) MG tablet Take 325 mg by mouth 2 (two) times daily with a meal.    [provider]  fluticasone (FLONASE) 50 MCG/ACT nasal spray Place 2 sprays into both nostrils daily. 10/20/20   Jaynee Eagles, PA-C  levothyroxine (SYNTHROID) 137 MCG tablet Take 1 tablet (137 mcg total) by mouth daily before breakfast. 02/28/21   Dorna Mai, MD  nystatin (MYCOSTATIN/NYSTOP) powder Apply 1 application topically 3 (three) times daily. 07/27/20   Wieters, Hallie C, PA-C  nystatin-triamcinolone (MYCOLOG II) cream Apply to affected area twice daily 07/27/20   Wieters, [employer] C, PA-C  polymixin-bacitracin (POLYSPORIN)  500-10000 UNIT/GM OINT ointment Apply 1 application topically 2 (two) times daily. 07/12/20   LampteyMyrene Galas, MD  pyridOXINE (VITAMIN B-6) 100 MG tablet Take 100 mg by mouth daily.    [provider]  sulfamethoxazole-trimethoprim (BACTRIM DS) 800-160 MG tablet Take 1 tablet by mouth 2 (two) times daily. 02/12/21   Dorna Mai, MD  tiZANidine (ZANAFLEX) 2 MG tablet Take 1-2 tablets (2-4 mg total) by mouth every 6 (six) hours as needed for muscle spasms. 12/06/20   Camillia Herter, NP  triamcinolone cream (KENALOG) 0.1 % Apply 1 application topically 2 (two) times daily. 09/12/20   Nicolette Bang, MD  vitamin B-12 (CYANOCOBALAMIN) 500 MCG tablet Take 1 tablet (500 mcg total) by mouth daily. 04/04/21   Dorna Mai, MD      Allergies    Ace inhibitors and Lisinopril    Review of Systems   Review of Systems  All other systems reviewed and are negative.  Physical Exam Updated Vital Signs BP (!) 148/94 (BP Location: Right Arm)    Pulse 79    Temp 98.4 F (36.9 C) (Oral)    Resp 18    Ht 5\' 4"  (1.626 m)    Wt 92 kg    SpO2 100%    BMI 34.81 kg/m  Physical Exam Vitals and nursing note reviewed.  Constitutional:      Appearance: She is well-developed.  HENT:  Head: Normocephalic.     Nose: Nose normal.     Mouth/Throat:     Mouth: Mucous membranes are moist.  Cardiovascular:     Rate and Rhythm: Normal rate and regular rhythm.  Pulmonary:     Effort: Pulmonary effort is normal.  Abdominal:     General: There is no distension.  Musculoskeletal:        General: Swelling present. Normal range of motion.     Cervical back: Normal range of motion.  Skin:    General: Skin is warm.  Neurological:     Mental Status: She is alert and oriented to person, place, and time.    ED Results / Procedures / Treatments   Labs (all labs ordered are listed, but only abnormal results are displayed) Labs Reviewed  CBC WITH DIFFERENTIAL/PLATELET  BASIC METABOLIC PANEL   TROPONIN I (HIGH SENSITIVITY)  TROPONIN I (HIGH SENSITIVITY)    EKG None  Radiology No results found.  Procedures Procedures    Medications Ordered in ED Medications - No data to display  ED Course/ Medical Decision Making/ A&P                           Medical Decision Making Amount and/or Complexity of Data Reviewed External Data Reviewed: labs and notes.    Details: Cardiology notes Labs: ordered. Decision-making details documented in ED Course.    Details: troponin is normal Radiology: ordered and independent interpretation performed. Decision-making details documented in ED Course.    Details: no acute cardiopulmonary abnormality ECG/medicine tests: ordered. Discussion of management or test interpretation with external provider(s): Pt advised to continue current medications.  Pt advised low sodium diet   Risk OTC drugs.           Final Clinical Impression(s) / ED Diagnoses Final diagnoses:  Localized swelling, mass, or lump of lower extremity, bilateral    Rx / DC Orders ED Discharge Orders     None      An After Visit Summary was printed and given to the patient.    Fransico Meadow, [place] 04/16/21 1446    Lucrezia Starch, MD 04/19/21 1224

## 2021-04-17 ENCOUNTER — Telehealth: Payer: Self-pay | Admitting: Licensed Clinical Social Worker

## 2021-04-17 NOTE — Telephone Encounter (Signed)
Attempted one more time to reach pt at preferred number [phone]. Pt did not answer, voicemail still full This will be my fourth time attempting to call pt with no response/call back. I have also attempted her son and left voicemail on his phone. I remain available should pt reach out.   Westley Hummer, MSW, [place]  [phone]- work cell phone (preferred) [phone]- desk phone

## 2021-04-21 NOTE — Progress Notes (Signed)
Patient ID: Kayla [name], female    DOB: April 22, 1945  MRN: [id number]  CC: Nerve Pain  Subjective: Kayla [name] is a 76 y.o. female who presents for nerve pain.  Reports bilateral nerve damage worsened since diagnosed with cellulitis in 2022. Followed by Dr. Arnoldo Morale at [facility]rology for the same but considering a second opinion. Would like to be tested for UTI.    Patient Active Problem List   Diagnosis Date Noted   Personal history of COVID-19 08/24/2020   Body mass index (BMI) 33.0-33.9, adult 06/20/2020   Status post lumbar microdiscectomy 06/20/2020   Essential (primary) hypertension 06/20/2020   Bilateral leg edema 06/17/2020   Cellulitis of leg, left 06/17/2020   Cellulitis of leg, right 06/17/2020   Lumbar herniated disc 05/25/2020   Hyponatremia 05/12/2020   Sciatica 05/12/2020   COVID-19 05/09/2020   Hypokalemia 05/04/2020   Dysphagia 04/17/2020   Renal insufficiency 12/29/2018   Cyst of left ovary 12/29/2018   Esophageal dysmotility 12/29/2018   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/29/2018   Acquired hypothyroidism 12/29/2018   Chronic maxillary sinusitis 07/07/2018   Snoring 06/01/2018   Costochondritis 06/01/2018   Family history of heart disease 06/01/2018   Class 2 severe obesity due to excess calories with serious comorbidity and body mass index (BMI) of 38.0 to 38.9 in adult [facility]) 06/01/2018   Multiple atypical skin moles 04/28/2017   Depression, recurrent ([place]) 07/16/2016   Adjustment disorder with mixed anxiety and depressed mood 01/15/2016   Fe Def Anemia 12/22/2015   Scoliosis (and kyphoscoliosis), idiopathic 12/20/2015   Osteopenia 12/20/2015   Fibromyalgia 08/02/2014   GAD (generalized anxiety disorder) 09/17/2012   Vitamin D deficiency 01/24/2012   Fatigue 10/14/2011   B12 deficiency    Hyperlipidemia 11/30/2007   Coronary artery disease involving native heart without angina pectoris 11/30/2007   Essential hypertension 01/27/2007   GERD  01/27/2007   IBS 01/27/2007     Current Outpatient Medications on File Prior to Visit  Medication Sig Dispense Refill   acetaminophen (TYLENOL) 500 MG tablet Take 1,000 mg by mouth in the morning, at noon, in the evening, and at bedtime.     amLODipine (NORVASC) 5 MG tablet Take 1 tablet (5 mg total) by mouth daily. 30 tablet 0   Ascorbic Acid (VITAMIN C) 1000 MG tablet Take 500 mg by mouth daily.     Calcium-Magnesium-Zinc (CAL-MAG-ZINC PO) Take 1 tablet by mouth 2 (two) times daily.     cetirizine (ZYRTEC ALLERGY) 10 MG tablet Take 1 tablet (10 mg total) by mouth daily. 30 tablet 0   chlorthalidone (HYGROTON) 25 MG tablet Take 25 mg by mouth daily.     Cholecalciferol (VITAMIN D3) 25 MCG (1000 UT) CAPS Take 1,000 Units by mouth daily.     ferrous sulfate 325 (65 FE) MG tablet Take 325 mg by mouth 2 (two) times daily with a meal.     fluticasone (FLONASE) 50 MCG/ACT nasal spray Place 2 sprays into both nostrils daily. 16 g 12   levothyroxine (SYNTHROID) 137 MCG tablet Take 1 tablet (137 mcg total) by mouth daily before breakfast. 90 tablet 0   nystatin (MYCOSTATIN/NYSTOP) powder Apply 1 application topically 3 (three) times daily. 30 g 0   nystatin-triamcinolone (MYCOLOG II) cream Apply to affected area twice daily 30 g 0   polymixin-bacitracin (POLYSPORIN) 500-10000 UNIT/GM OINT ointment Apply 1 application topically 2 (two) times daily. 14 g 0   pyridOXINE (VITAMIN B-6) 100 MG tablet Take 100 mg by  mouth daily.     sulfamethoxazole-trimethoprim (BACTRIM DS) 800-160 MG tablet Take 1 tablet by mouth 2 (two) times daily. 6 tablet 0   tiZANidine (ZANAFLEX) 2 MG tablet Take 1-2 tablets (2-4 mg total) by mouth every 6 (six) hours as needed for muscle spasms. 30 tablet 0   triamcinolone cream (KENALOG) 0.1 % Apply 1 application topically 2 (two) times daily. 30 g 0   vitamin B-12 (CYANOCOBALAMIN) 500 MCG tablet Take 1 tablet (500 mcg total) by mouth daily. 90 tablet 0   No current  facility-administered medications on file prior to visit.    Allergies  Allergen Reactions   Ace Inhibitors Anaphylaxis    REACTION: glossal edema   Lisinopril Anaphylaxis    Social History   Socioeconomic History   Marital status: Widowed    Spouse name: Not on file   Number of children: 2   Years of education: Not on file   Highest education level: Bachelor's degree (e.g., BA, AB, BS)  Occupational History   Not on file  Tobacco Use   Smoking status: Never   Smokeless tobacco: Never  Vaping Use   Vaping Use: Never used  Substance and Sexual Activity   Alcohol use: No   Drug use: No   Sexual activity: Not on file  Other Topics Concern   Not on file  Social History Narrative   Lost husband 2009, lost mother 2016      Lives at home (son & 3 grandchildren live with her)   Caffeine: 10-12 oz daily   Social Determinants of Health   Financial Resource Strain: Not on file  Food Insecurity: Not on file  Transportation Needs: Not on file  Physical Activity: Not on file  Stress: Not on file  Social Connections: Not on file  Intimate Partner Violence: Not on file    Family History  Problem Relation Age of Onset   Heart attack Father 60   Lung cancer Other        uncle, non smoker   Leukemia Other        uncle   Cancer Mother        oral   Stroke Mother    Alzheimer's disease Mother        at 70   Cancer Maternal Aunt        breast   Alcohol abuse Maternal Aunt    Alcohol abuse Maternal Uncle    Cancer Maternal Uncle        lung, stomach, oral   Cancer Paternal Uncle        lung   Congestive Heart Failure Maternal Grandfather    Heart disease Maternal Grandfather    Heart attack Paternal Grandfather    Cancer Paternal Uncle        bone   Brain cancer Maternal Grandmother    Alzheimer's disease Other        maternal great grandmother    Colon cancer Neg Hx    Breast cancer Neg Hx    Diabetes Neg Hx     Past Surgical History:  Procedure Laterality  Date   CESAREAN SECTION  06/10/1975   CHOLECYSTECTOMY     LUMBAR LAMINECTOMY/DECOMPRESSION MICRODISCECTOMY Right 05/25/2020   Procedure: MICRODISCECTOMY Right Lumbar three - four;  Surgeon: Newman Pies, MD;  Location: [facility];  Service: Neurosurgery;  Laterality: Right;   NECK SURGERY  1998   RADIOLOGY WITH ANESTHESIA N/A 05/16/2020   Procedure: MRI LUMBAR SPINE W/CONTRAST;  Surgeon: Radiologist, Medication, MD;  Location: [facility];  Service: Radiology;  Laterality: N/A;   repair broken C6 & C7      ROS: Review of Systems Negative except as stated above  PHYSICAL EXAM: BP (!) 143/87 (BP Location: Left Arm, Patient Position: Sitting, Cuff Size: Large)    Pulse 76    Temp 98.5 F (36.9 C)    Resp 18    Ht 5' 4.02" (1.626 m)    Wt 199 lb (90.3 kg)    SpO2 96%    BMI 34.14 kg/m   Physical Exam HENT:     Head: Normocephalic and atraumatic.  Eyes:     Extraocular Movements: Extraocular movements intact.     Conjunctiva/sclera: Conjunctivae normal.  Cardiovascular:     Rate and Rhythm: Normal rate and regular rhythm.     Pulses: Normal pulses.     Heart sounds: Normal heart sounds.  Pulmonary:     Effort: Pulmonary effort is normal.     Breath sounds: Normal breath sounds.  Musculoskeletal:        General: Normal range of motion.     Cervical back: Normal range of motion and neck supple.  Skin:    General: Skin is warm and dry.  Neurological:     General: No focal deficit present.     Mental Status: She is alert and oriented to person, place, and time.  Psychiatric:        Mood and Affect: Mood normal.        Behavior: Behavior normal.   Results for orders placed or performed in visit on 04/24/21  POCT URINALYSIS DIP ([facility])  Result Value Ref Range   Color, UA yellow yellow   Clarity, UA clear clear   Glucose, UA negative negative mg/dL   Bilirubin, UA negative negative   Ketones, POC UA negative negative mg/dL   Spec Grav, UA >=1.030 (A) 1.010 - 1.025   Blood, UA  trace-intact (A) negative   pH, UA 5.5 5.0 - 8.0   POC PROTEIN,UA trace negative, trace   Urobilinogen, UA 0.2 0.2 or 1.0 E.U./dL   Nitrite, UA Negative Negative   Leukocytes, UA Trace (A) Negative    ASSESSMENT AND PLAN: 1. Neuropathic pain of both legs: - Referral to Neurology for further evaluation and management.  - Ambulatory referral to Neurology  2. Urinary frequency: - No urinary tract infection.  - POCT URINALYSIS DIP ([facility]INITEK)    Patient was given the opportunity to ask questions.  Patient verbalized understanding of the plan and was able to repeat key elements of the plan. Patient was given clear instructions to go to Emergency Department or return to medical center if symptoms don't improve, worsen, or new problems develop.The patient verbalized understanding.   Orders Placed This Encounter  Procedures   Ambulatory referral to Neurology   POCT URINALYSIS DIP (CLIN[facility]K)    Follow-up with primary provider as scheduled.    Camillia Herter, NP

## 2021-04-24 ENCOUNTER — Ambulatory Visit: Payer: Medicare PPO | Admitting: Family

## 2021-04-24 ENCOUNTER — Other Ambulatory Visit: Payer: Self-pay

## 2021-04-24 VITALS — BP 143/87 | HR 76 | Temp 98.5°F | Resp 18 | Ht 64.02 in | Wt 199.0 lb

## 2021-04-24 DIAGNOSIS — Z712 Person consulting for explanation of examination or test findings: Secondary | ICD-10-CM

## 2021-04-24 DIAGNOSIS — R350 Frequency of micturition: Secondary | ICD-10-CM | POA: Diagnosis not present

## 2021-04-24 DIAGNOSIS — G5793 Unspecified mononeuropathy of bilateral lower limbs: Secondary | ICD-10-CM | POA: Diagnosis not present

## 2021-04-24 LAB — POCT URINALYSIS DIP (CLINITEK)
Bilirubin, UA: NEGATIVE
Glucose, UA: NEGATIVE mg/dL
Ketones, POC UA: NEGATIVE mg/dL
Nitrite, UA: NEGATIVE
Spec Grav, UA: >=1.03 — AB (ref 1.010–1.025)
Urobilinogen, UA: 0.2 E.U./dL
pH, UA: 5.5 (ref 5.0–8.0)

## 2021-04-24 NOTE — Progress Notes (Signed)
No urinary tract infection.

## 2021-04-24 NOTE — Progress Notes (Signed)
OTHER Pt presents for referral for new specialist for nerve damage. Pt states that she wants to have a urinalysis states she may have UTI

## 2021-04-27 ENCOUNTER — Other Ambulatory Visit: Payer: Self-pay | Admitting: Family

## 2021-04-27 DIAGNOSIS — M5431 Sciatica, right side: Secondary | ICD-10-CM

## 2021-05-01 IMAGING — CR DG LUMBAR SPINE COMPLETE 4+V
5 series · 5 of 5 positions shown · non-contrast
Comparison: Lumbar spine x-rays dated May 25, 2020. MRI lumbar
spine dated May 16, 2020.

CLINICAL DATA: Low back pain radiating to the right buttock and
groin. Bilateral leg tingling.

EXAM:
LUMBAR SPINE - COMPLETE 4+ VIEW

[t l-spine a.p.]
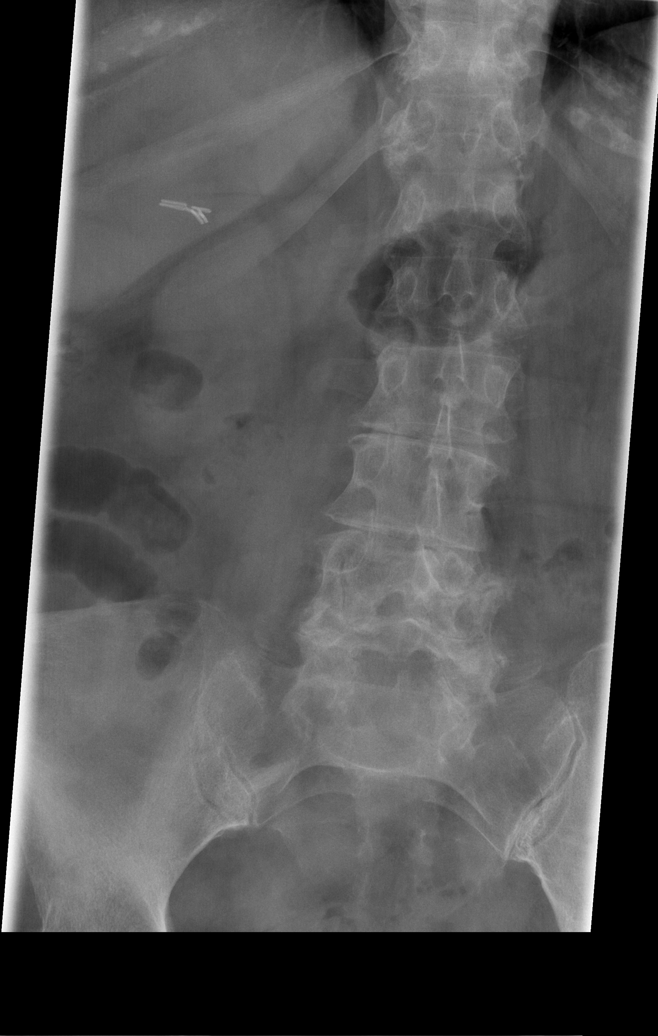

[t l-spine oblique exposure (1 of 2)]
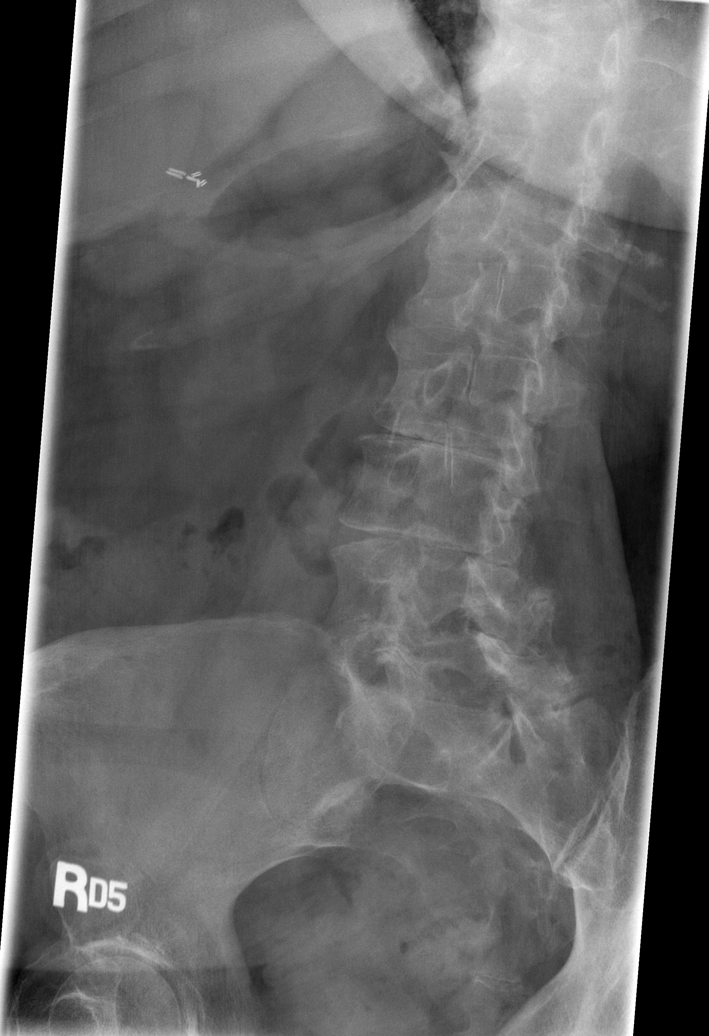

[t l-spine oblique exposure (2 of 2)]
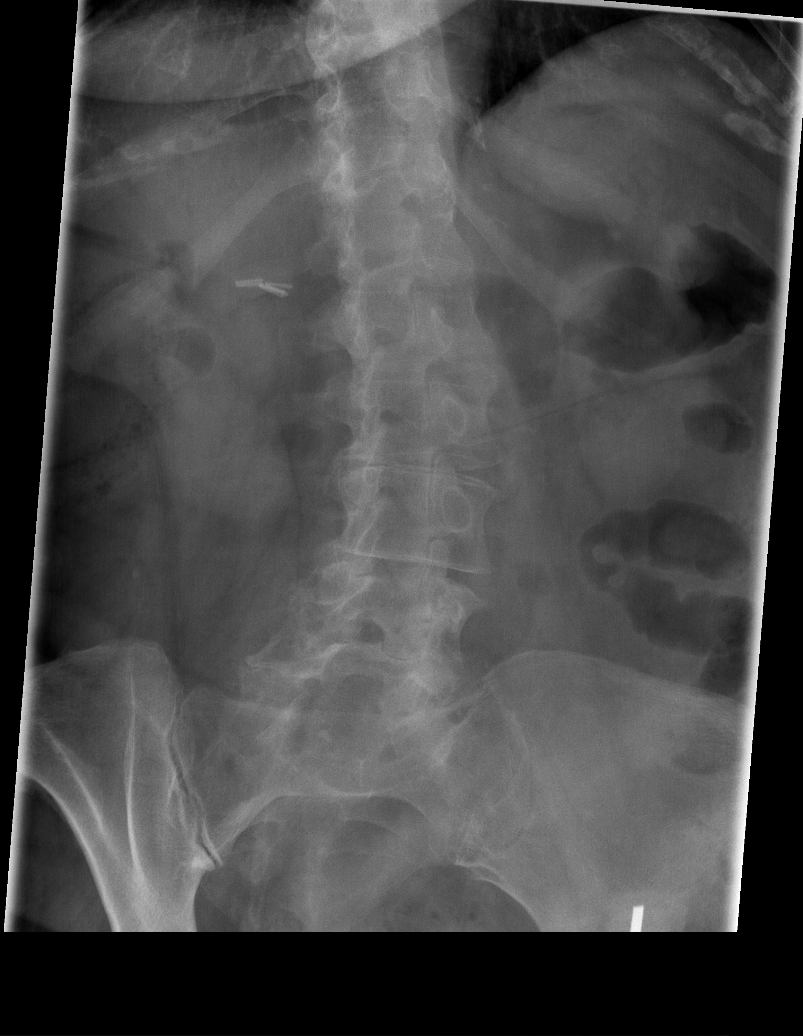

[t l-spine lat]
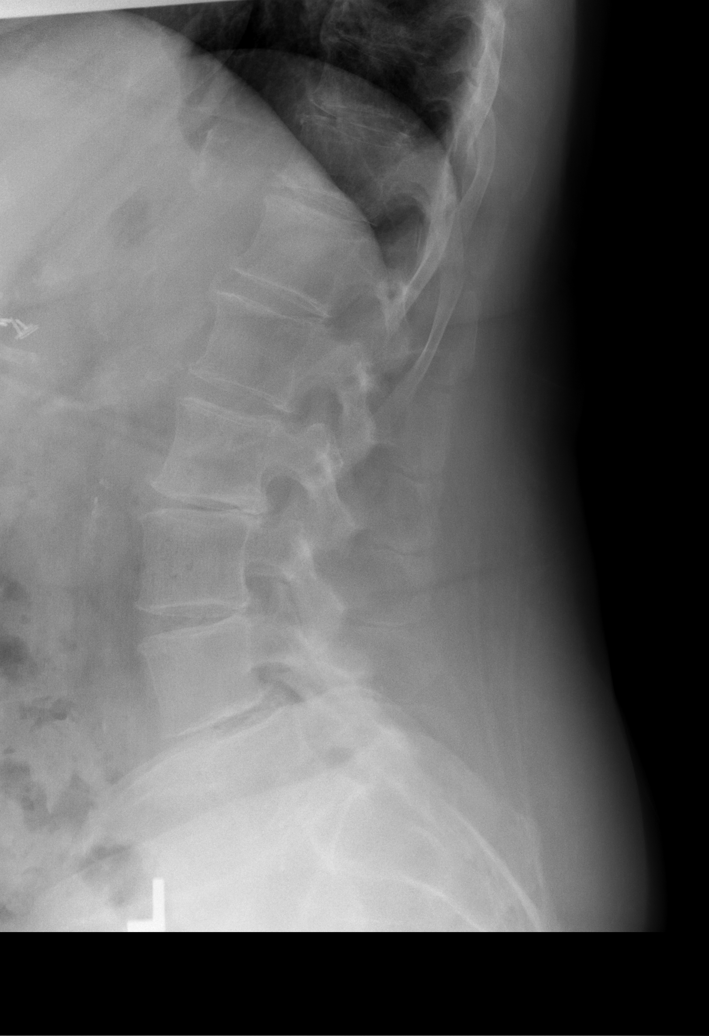

[t l-spine l5-s1 spot]
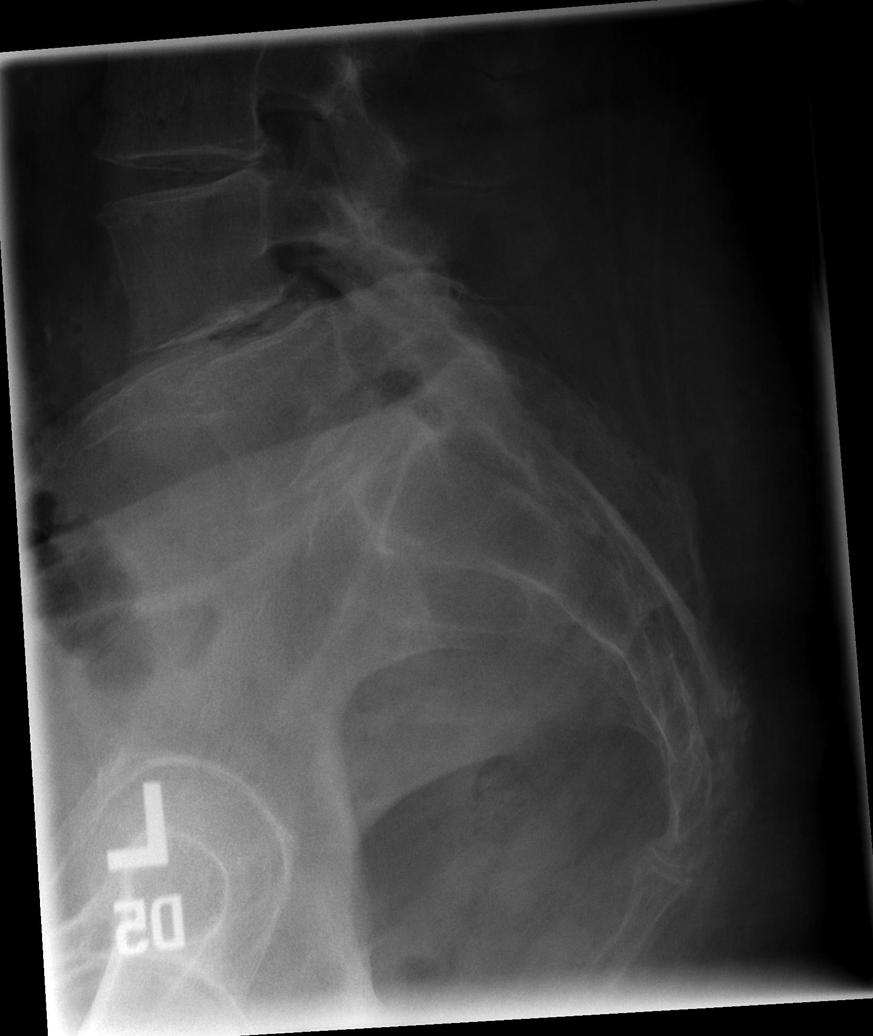

[5 of 5 positions shown; findings below may reference images not displayed]

FINDINGS: Five lumbar type vertebral bodies.

No acute fracture or subluxation. Vertebral body heights are
preserved.

Unchanged mild anterolisthesis at L4-L5 and L5-S1.

Unchanged moderate disc height loss from L2-L3 through L5-S1.
Unchanged advanced facet arthropathy at L4-L5 and L5-S1.

Prior cholecystectomy.
IMPRESSION: 1. No acute osseous abnormality.
2. Unchanged moderate multilevel lumbar spondylosis as described
above, with facet mediated grade 1 anterolisthesis at L4-L5 and
L5-S1.

## 2021-05-02 ENCOUNTER — Ambulatory Visit (HOSPITAL_BASED_OUTPATIENT_CLINIC_OR_DEPARTMENT_OTHER): Payer: Medicare PPO | Admitting: Cardiology

## 2021-05-02 ENCOUNTER — Other Ambulatory Visit: Payer: Self-pay

## 2021-05-02 VITALS — BP 132/90 | HR 81 | Ht 64.0 in | Wt 203.2 lb

## 2021-05-02 DIAGNOSIS — I2510 Atherosclerotic heart disease of native coronary artery without angina pectoris: Secondary | ICD-10-CM | POA: Diagnosis not present

## 2021-05-02 DIAGNOSIS — R600 Localized edema: Secondary | ICD-10-CM | POA: Diagnosis not present

## 2021-05-02 DIAGNOSIS — Z8249 Family history of ischemic heart disease and other diseases of the circulatory system: Secondary | ICD-10-CM | POA: Diagnosis not present

## 2021-05-02 DIAGNOSIS — I10 Essential (primary) hypertension: Secondary | ICD-10-CM

## 2021-05-02 MED ORDER — AMLODIPINE BESYLATE 5 MG PO TABS: 5.0000 mg | ORAL_TABLET | Freq: Every day | ORAL | 3 refills | Status: DC

## 2021-05-02 NOTE — Progress Notes (Signed)
°Cardiology Office Note:   ° °Date:  05/02/2021  ° °ID:  Kayla [name], DOB 12/14/1945, MRN [id number] ° °PCP:  Wilson, Amelia, MD  °Cardiologist:  Bridgette Christopher, MD PhD ° °Referring MD: Wilson, Amelia, MD  ° °CC: follow up ° °History of Present Illness:   ° °Kayla [name] is a 75 y.o. female with a hx of mild CAD, HTN, HLD, IBS, hypothyroid, GERD, fibromyalgia, OA who is seen for follow up today. I first met her on 06/01/18. Seen initially by Rhonda Barrett 03/26/18 for hypertension and chest discomfort. ° °Today: °Overall, she is not feeling well. She has been under extreme anxiety and stress. Also, she has been suffering from constant, severe arthralgias. She endorses edema in her ankles, and she is unable to tolerate palpation on her right LE. She is having difficulty with walking, and is unable to sleep well. ° °She presented to the ED in 03/2021 and again in 04/10/2021. Initially, she developed headaches, which she describes as feeling like her head was swelling. Prior to her ED visit in December, her blood pressure at [employer] was 198/129. She rechecked this at the fire station and it was 130/110. She was sent to the ED by ambulance. A few nights later (1/3-1/4) she felt palpitations similar to what she has described at previous appointments. Her blood pressure was hypertensive at [employer], and she was sent to the ED by ambulance. ° °Occasionally she feels a sensation in her throat like she is choking, and she feels nauseous after she eats. ° °She denies any chest pain, or shortness of breath. No lightheadedness, syncope, orthopnea, or PND. ° ° °Past Medical History:  °Diagnosis Date  ° Anemia   ° Anxiety   ° Asthma   ° B12 deficiency   ° Bursitis   ° Chronic LBP   ° Chronic UTI   ° Diastolic dysfunction   ° Fibromyalgia   ° GERD (gastroesophageal reflux disease)   ° History of low potassium   ° Hyperlipemia   ° Hypertension   ° Hypothyroid   ° IBS (irritable bowel syndrome)   ° Inflammatory osteoarthritis   °  Kidney infection   ° Psoriatic arthritis (HCC)   ° S/P cardiac cath 11/30/2007  ° normal coronaries - Dr. Cooper (Dr. Hilty reviewed films on 01/10/2016)  ° ° °Past Surgical History:  °Procedure Laterality Date  ° CESAREAN SECTION  06/10/1975  ° CHOLECYSTECTOMY    ° LUMBAR LAMINECTOMY/DECOMPRESSION MICRODISCECTOMY Right 05/25/2020  ° Procedure: MICRODISCECTOMY Right Lumbar three - four;  Surgeon: Jenkins, Jeffrey, MD;  Location: [facility] OR;  Service: Neurosurgery;  Laterality: Right;  ° NECK SURGERY  1998  ° RADIOLOGY WITH ANESTHESIA N/A 05/16/2020  ° Procedure: MRI LUMBAR SPINE W/CONTRAST;  Surgeon: Radiologist, Medication, MD;  Location: [facility] OR;  Service: Radiology;  Laterality: N/A;  ° repair broken C6 & C7    ° ° °Current Medications: °Current Outpatient Medications on File Prior to Visit  °Medication Sig  ° acetaminophen (TYLENOL) 500 MG tablet Take 1,000 mg by mouth in the morning, at noon, in the evening, and at bedtime.  ° amLODipine (NORVASC) 5 MG tablet Take 1 tablet (5 mg total) by mouth daily.  ° Ascorbic Acid (VITAMIN C) 1000 MG tablet Take 500 mg by mouth daily.  ° Calcium-Magnesium-Zinc (CAL-MAG-ZINC PO) Take 1 tablet by mouth 2 (two) times daily.  ° Cholecalciferol (VITAMIN D3) 25 MCG (1000 UT) CAPS Take 1,000 Units by mouth daily.  ° ferrous sulfate 325 (65 FE) MG   MG tablet Take 325 mg by mouth 2 (two) times daily with a meal.   fluticasone (FLONASE) 50 MCG/ACT nasal spray Place 2 sprays into both nostrils daily.   levothyroxine (SYNTHROID) 137 MCG tablet Take 1 tablet (137 mcg total) by mouth daily before breakfast.   nystatin (MYCOSTATIN/NYSTOP) powder Apply 1 application topically 3 (three) times daily.   nystatin-triamcinolone (MYCOLOG II) cream Apply to affected area twice daily   polymixin-bacitracin (POLYSPORIN) 500-10000 UNIT/GM OINT ointment Apply 1 application topically 2 (two) times daily.   pyridOXINE (VITAMIN B-6) 100 MG tablet Take 100 mg by mouth daily.   triamcinolone cream (KENALOG) 0.1 %  Apply 1 application topically 2 (two) times daily.   vitamin B-12 (CYANOCOBALAMIN) 500 MCG tablet Take 1 tablet (500 mcg total) by mouth daily.   No current facility-administered medications on file prior to visit.     Allergies:   Ace inhibitors and Lisinopril   Social History   Tobacco Use   Smoking status: Never   Smokeless tobacco: Never  Vaping Use   Vaping Use: Never used  Substance Use Topics   Alcohol use: No   Drug use: No    Family History: The patient's family history includes Alcohol abuse in her maternal aunt and maternal uncle; Alzheimer's disease in her mother and another family member; Brain cancer in her maternal grandmother; Cancer in her maternal aunt, maternal uncle, mother, paternal uncle, and paternal uncle; Congestive Heart Failure in her maternal grandfather; Heart attack in her paternal grandfather; Heart attack (age of onset: 53) in her father; Heart disease in her maternal grandfather; Leukemia in an other family member; Lung cancer in an other family member; Stroke in her mother. There is no history of Colon cancer, Breast cancer, or Diabetes.  ROS:   Please see the history of present illness.   (+) Severe arthralgias (+) Stress/anxiety (+) Insomnia (+) Nausea Additional pertinent ROS negative except as noted.   EKGs/Labs/Other Studies Reviewed:    The following studies were reviewed today:  Echo 09/22/2020: 1. Left ventricular ejection fraction, by estimation, is 60 to 65%. The  left ventricle has normal function. The left ventricle has no regional  wall motion abnormalities. Left ventricular diastolic parameters are  consistent with Grade I diastolic  dysfunction (impaired relaxation). Elevated left atrial pressure.   2. Right ventricular systolic function is normal. The right ventricular  size is normal. There is normal pulmonary artery systolic pressure.   3. Left atrial size was mildly dilated.   4. The mitral valve is normal in structure.  Trivial mitral valve  regurgitation. No evidence of mitral stenosis. Moderate mitral annular  calcification.   5. The aortic valve has an indeterminant number of cusps. Aortic valve  regurgitation is not visualized. No aortic stenosis is present.   6. There is mild dilatation of the ascending aorta, measuring 43 mm.   7. The inferior vena cava is normal in size with greater than 50%  respiratory variability, suggesting right atrial pressure of 3 mmHg.  LE Venous Bilateral DVT 06/09/2020: RIGHT:  - There is no evidence of deep vein thrombosis in the lower extremity.  However, portions of this examination were limited- see technologist  comments above.     - No cystic structure found in the popliteal fossa.     LEFT:  - There is no evidence of deep vein thrombosis in the lower extremity.  However, portions of this examination were limited- see technologist  comments above.     - No  cystic structure found in the popliteal fossa.   CTA 05/22/18 1. 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 Technically, current recommendations call for annual follow-up imaging for thoracic aneurysms of 4 cm or larger. However, 10 year stability of the current aneurysm is certainly reassuring. Aortic aneurysm NOS (ICD10-I71.9), Aortic Atherosclerosis (ICD10-170.0) 2. Small hiatal hernia.   Echo 04/29/18 Left ventricle: The cavity size was normal. There was mild   concentric hypertrophy. Systolic function was vigorous. The   estimated ejection fraction was in the range of 65% to 70%. Wall   motion was normal; there were no regional wall motion   abnormalities. Doppler parameters are consistent with abnormal   left ventricular relaxation (grade 1 diastolic dysfunction). - Aortic valve: Valve area (VTI): 4.83 cm^2. Valve area (Vmax):   4.21 cm^2.  Valve area (Vmean): 4.76 cm^2. - Left atrium: The atrium was mildly dilated. - Right ventricle: The cavity size was normal. Wall thickness was   normal. Systolic function was normal. - Right atrium: The atrium was normal in size. - Pulmonary arteries: Systolic pressure was within the normal   range. - Inferior vena cava: The vessel was normal in size. - Pericardium, extracardiac: There was no pericardial effusion.   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 A dedicated chest CTA or MRA is   recommended.  Monitor read 06/01/18 13 days of data recorded on Zio monitor. Patient had a min HR of 44 bpm, max HR of 162 bpm, and avg HR of 70 bpm. Predominant underlying rhythm was Sinus Rhythm. 6 episodes of SVT noted, longest lasted 18.4 seconds at 110 bpm. Fastest lasted only 6 beats at a max rate of 162 bpm. No VT, atrial fibrillation, high degree block, or pauses noted. Isolated atrial and ventricular ectopy was rare (<1%). There were 19 patient triggered events. These were all sinus rhythm, rarely with PAC or PVC.   ECHO: 07/14/2014 - Left ventricle: The cavity size was normal. There was mild   concentric hypertrophy. Systolic function was vigorous. The   estimated ejection fraction was in the range of 65% to 70%. Wall   motion was normal; there were no regional wall motion   abnormalities. Doppler parameters are consistent with abnormal   left ventricular relaxation (grade 1 diastolic dysfunction).   There was no evidence of elevated ventricular filling pressure by   Doppler parameters. - Aortic valve: Mildly thickened, mildly calcified leaflets. There   was no regurgitation. - Ascending aorta: The ascending aorta was normal in size. - Mitral valve: Structurally normal valve. There was trivial   regurgitation. - Left atrium: The atrium was normal in size. - Right ventricle: The cavity size was normal. Wall thickness was    normal. Systolic pressure couldn&'t be assessed as there was no   tricuspid regurgitation. - Atrial septum: No defect or patent foramen ovale was identified. - Tricuspid valve: There was no significant regurgitation. - Pulmonic valve: There was no regurgitation. - Inferior vena cava: The vessel was normal in size. The   respirophasic diameter changes were in the normal range (= 50%),   consistent with normal central venous pressure.   MYOVIEW: 07/21/2014 Impression Exercise Capacity:  [place] with no exercise. BP Response:  Normal blood pressure response. Clinical Symptoms:  Mild shortness of breath ECG Impression:  significant ST segment change suggestive of ischemia. °Comparison with Prior Nuclear Study: No images to compare °  °Overall Impression:  Normal stress nuclear study. °  °LV Wall Motion:  NL LV Function, EF 72%; NL Wall Motion ° °EKG:  EKG is personally reviewed. °05/02/2021: EKG was not ordered. °11/16/2020: EKG was not ordered.   °08/24/2020: EKG was not ordered. °11/05/18: normal sinus rhythm ° °Recent Labs: °07/24/2020: Magnesium 2.1 °03/27/2021: TSH 7.950 °04/04/2021: ALT 13 °04/11/2021: BUN 10; Creatinine, Ser 0.89; Hemoglobin 14.2; Platelets 311; Potassium 4.0; Sodium 139  ° °Recent Lipid Panel °   °Component Value Date/Time  ° CHOL 201 (H) 03/27/2021 1504  ° TRIG 223 (H) 03/27/2021 1504  ° HDL 52 03/27/2021 1504  ° CHOLHDL 3.9 03/27/2021 1504  ° CHOLHDL 6.4 (H) 01/01/2016 1012  ° VLDL 58 (H) 01/01/2016 1012  ° LDLCALC 111 (H) 03/27/2021 1504  ° LDLDIRECT 169.8 04/15/2007 1017  ° ° °Physical Exam:   ° °VS:  BP 132/90 (BP Location: Right Arm, Patient Position: Sitting, Cuff Size: Normal)    Pulse 81    Ht 5' 4" (1.626 m)    Wt 203 lb 3.2 oz (92.2 kg)    SpO2 98%    BMI 34.88 kg/m²    ° °Wt Readings from Last 3 Encounters:  °05/02/21 203 lb 3.2 oz (92.2 kg)  °04/24/21 199 lb (90.3 kg)  °04/11/21 202 lb 13.2 oz (92 kg)  °  °GEN: Well nourished, well developed in no acute distress °HEENT:  Normal, moist mucous membranes. Hard of hearing. °NECK: No JVD °CARDIAC: regular rhythm, normal S1 and S2, no rubs or gallops. No murmur. °VASCULAR: Radial and DP pulses 2+ bilaterally, no carotid bruits °RESPIRATORY:  Clear to auscultation without rales, wheezing or rhonchi  °ABDOMEN: Soft, non-tender, non-distended °MUSCULOSKELETAL:  Ambulates independently °SKIN: Warm and dry. Trivial bilateral LE edema with mild chronic skin discoloration. Tenderness to palpation on her right LE. Benign cyst-like lesion in right forearm. °NEUROLOGIC:  Alert and oriented x 3. No focal neuro deficits noted. °PSYCHIATRIC:  Normal affect  ° °ASSESSMENT:   ° °1. Bilateral lower extremity edema   °2. Essential hypertension   °3. Nonocclusive coronary atherosclerosis of native coronary artery   °4. Family history of heart disease   ° °PLAN:   ° °Bilateral LE edema: °-much improved compared to prior °-ultrasound negative for DVT °-ABI/arterial evaluation normal °-echo reassuring, normal filling pressures, normal EF, only minimal DD °-very tender, does not tolerate anything touching her feet, unlikely to tolerate compression stockings.  °-counseled on elevation °-as this has improved, will avoid diuretics given prior profound effect on electrolytes ° °Hypertension: above goal today, but close. Under significant stress °-she is tolerating amlodipine 5 mg daily with only trivial edema, would continue °-chlorthalidone previously discontinued given electrolyte abnormalities °-continue to monitor °-Goal is <130/80.  °-she is very concerned re: history with prior kidneys. Will not take ACEi, ARB, MRA.  °-would be cautious about beta blockers given fatigue ° °Nonobstructive CAD:  °-aspirin: takes intermittently °-statin: has been on in the past, self-discontinued, declined restarting multiple times in the past.  °-discussed red flag warning signs that need immediate medical attention ° °History of TAA: stable on imaging (10 years between) ° °CV  risk factors: °-has family history of CAD, increases her risk °-class 2 obesity, with serious side effect of CAD: would benefit from weight loss.  ° °CV risk prevention counseling °-recommend heart healthy/Mediterranean diet, with whole grains, fruits, vegetable, fish, lean meats, nuts, and   and olive oil. Limit salt. -recommend moderate walking, 3-5 times/week for 30-50 minutes each session. Aim for at least 150 minutes.week. Goal should be pace of 3 miles/hours, or walking 1.5 miles in 30 minutes -recommend avoidance of tobacco products. Avoid excess alcohol.  Plan for follow up: 6 months or sooner as needed  Buford Dresser, MD, PhD, [facility]   Medication Adjustments/Labs and Tests Ordered: Current medicines are reviewed at length with the patient today.  Concerns regarding medicines are outlined above.   No orders of the defined types were placed in this encounter.  Meds ordered this encounter  Medications   DISCONTD: amLODipine (NORVASC) 5 MG tablet    Sig: Take 1 tablet (5 mg total) by mouth daily.    Dispense:  90 tablet    Refill:  3   Patient Instructions  Medication Instructions:  Your Physician recommend you continue on your current medication as directed.    *If you need a refill on your cardiac medications before your next appointment, please call your pharmacy*   Lab Work: None ordered today   Testing/Procedures: None ordered today   Follow-Up: At [facility], you and your health needs are our priority.  As part of our continuing mission to provide you with exceptional heart care, we have created designated Provider Care Teams.  These Care Teams include your primary Cardiologist (physician) and Advanced Practice Providers (APPs -  Physician Assistants and Nurse Practitioners) who all work together to provide you with the care you need, when you need it.  We recommend signing up for the patient portal called "MyChart".  Sign up  information is provided on this After Visit Summary.  MyChart is used to connect with patients for Virtual Visits (Telemedicine).  Patients are able to view lab/test results, encounter notes, upcoming appointments, etc.  Non-urgent messages can be sent to your provider as well.   To learn more about what you can do with MyChart, go to [email or web address].    Your next appointment:   6 month(s)  The format for your next appointment:   In Person  Provider:   Buford Dresser, MD        [facility],acting as a scribe for Buford Dresser, MD.,have documented all relevant documentation on the behalf of Buford Dresser, MD,as directed by  Buford Dresser, MD while in the presence of Buford Dresser, MD.  I, Buford Dresser, MD, have reviewed all documentation for this visit. The documentation on 05/02/21 for the exam, diagnosis, procedures, and orders are all accurate and complete.  Signed, Buford Dresser, MD PhD 05/02/2021   Friend

## 2021-05-02 NOTE — Patient Instructions (Signed)

## 2021-05-07 ENCOUNTER — Telehealth: Payer: Self-pay | Admitting: Cardiology

## 2021-05-07 MED ORDER — AMLODIPINE BESYLATE 5 MG PO TABS: 5.0000 mg | ORAL_TABLET | Freq: Every day | ORAL | 3 refills | Status: DC

## 2021-05-07 NOTE — Telephone Encounter (Signed)
°*  STAT* If patient is at the pharmacy, call can be transferred to refill team.   1. Which medications need to be refilled? (please list name of each medication and dose if known)  amLODipine (NORVASC) 5 MG tablet  2. Which pharmacy/location (including street and city if local pharmacy) is medication to be sent to?[id number] [address], [address]  Phone:  [phone] Fax:  [phone]   3. Do they need a 30 day or 90 day supply? 30 ds

## 2021-05-07 NOTE — Telephone Encounter (Signed)
Refills sent to the pharmacy.

## 2021-05-25 ENCOUNTER — Encounter (HOSPITAL_BASED_OUTPATIENT_CLINIC_OR_DEPARTMENT_OTHER): Payer: Self-pay | Admitting: Cardiology

## 2021-06-06 ENCOUNTER — Encounter (HOSPITAL_BASED_OUTPATIENT_CLINIC_OR_DEPARTMENT_OTHER): Payer: Self-pay

## 2021-06-06 NOTE — Telephone Encounter (Signed)
Dr. Harrell Gave is this a you thing or a medical records thing, let me know!  ?

## 2021-06-07 NOTE — Telephone Encounter (Signed)
I sent her a note--I can't access payment or other issues, but medical records may be able to send her note and provide assistance. Thanks.

## 2021-06-15 ENCOUNTER — Encounter (HOSPITAL_COMMUNITY): Payer: Self-pay

## 2021-06-15 ENCOUNTER — Emergency Department (HOSPITAL_COMMUNITY)
Admission: EM | Admit: 2021-06-15 | Discharge: 2021-06-16 | Disposition: A | Discharge status: Home / Self Care | Payer: Medicare PPO | Attending: Emergency Medicine | Admitting: Emergency Medicine

## 2021-06-15 ENCOUNTER — Other Ambulatory Visit: Payer: Self-pay

## 2021-06-15 DIAGNOSIS — M25519 Pain in unspecified shoulder: Secondary | ICD-10-CM | POA: Diagnosis not present

## 2021-06-15 DIAGNOSIS — I10 Essential (primary) hypertension: Secondary | ICD-10-CM | POA: Diagnosis not present

## 2021-06-15 DIAGNOSIS — R079 Chest pain, unspecified: Secondary | ICD-10-CM | POA: Diagnosis not present

## 2021-06-15 DIAGNOSIS — G4489 Other headache syndrome: Secondary | ICD-10-CM | POA: Diagnosis not present

## 2021-06-15 DIAGNOSIS — F419 Anxiety disorder, unspecified: Secondary | ICD-10-CM | POA: Insufficient documentation

## 2021-06-15 DIAGNOSIS — G44209 Tension-type headache, unspecified, not intractable: Secondary | ICD-10-CM | POA: Insufficient documentation

## 2021-06-15 DIAGNOSIS — M549 Dorsalgia, unspecified: Secondary | ICD-10-CM | POA: Diagnosis not present

## 2021-06-15 DIAGNOSIS — M545 Low back pain, unspecified: Secondary | ICD-10-CM | POA: Diagnosis not present

## 2021-06-15 DIAGNOSIS — R519 Headache, unspecified: Secondary | ICD-10-CM | POA: Diagnosis not present

## 2021-06-15 NOTE — ED Triage Notes (Signed)
Patient BIB [facility] from home. Patient [place] pressure [place] [facility] and said it was 180/120. Patient is having bilateral shoulder pain. Off and on since 4pm this afternoon. She said it has made her neck stiff. Complaining of headache for 2 days.  ?

## 2021-06-16 ENCOUNTER — Emergency Department (HOSPITAL_COMMUNITY): Payer: Medicare PPO

## 2021-06-16 DIAGNOSIS — R519 Headache, unspecified: Secondary | ICD-10-CM | POA: Diagnosis not present

## 2021-06-16 DIAGNOSIS — R079 Chest pain, unspecified: Secondary | ICD-10-CM | POA: Diagnosis not present

## 2021-06-16 LAB — CBC
HCT: 46.2 % — ABNORMAL HIGH (ref 36.0–46.0)
Hemoglobin: 15.1 g/dL — ABNORMAL HIGH (ref 12.0–15.0)
MCH: 32.5 pg (ref 26.0–34.0)
MCHC: 32.7 g/dL (ref 30.0–36.0)
MCV: 99.4 fL (ref 80.0–100.0)
Platelets: 288 10*3/uL (ref 150–400)
RBC: 4.65 MIL/uL (ref 3.87–5.11)
RDW: 12 % (ref 11.5–15.5)
WBC: 8.5 10*3/uL (ref 4.0–10.5)
nRBC: 0 % (ref 0.0–0.2)

## 2021-06-16 LAB — BASIC METABOLIC PANEL
Anion gap: 7 (ref 5–15)
BUN: 12 mg/dL (ref 8–23)
CO2: 22 mmol/L (ref 22–32)
Calcium: 9.2 mg/dL (ref 8.9–10.3)
Chloride: 110 mmol/L (ref 98–111)
Creatinine, Ser: 0.78 mg/dL (ref 0.44–1.00)
GFR, Estimated: >60 mL/min (ref >60)
Glucose, Bld: 92 mg/dL (ref 70–99)
Potassium: 3.9 mmol/L (ref 3.5–5.1)
Sodium: 139 mmol/L (ref 135–145)

## 2021-06-16 LAB — TROPONIN I (HIGH SENSITIVITY)
Troponin I (High Sensitivity): 4 ng/L (ref <18)
Troponin I (High Sensitivity): 5 ng/L (ref <18)

## 2021-06-16 MED ORDER — LIDOCAINE 5 % EX PTCH
1.0000 | MEDICATED_PATCH | CUTANEOUS | Status: DC
  Administered 2021-06-16: 1 via TRANSDERMAL
  Filled 2021-06-16: qty 1

## 2021-06-16 NOTE — ED Notes (Signed)
Provided heat packs.  ?

## 2021-06-16 NOTE — ED Provider Notes (Signed)
?[place] DEPT ?[facility] Emergency Department ?Provider Note ?MRN:  [id number]  ?Arrival date & time: 06/16/21    ? ?Chief Complaint   ?Hypertension ?  ?History of Present Illness   ?Kayla [name] is a 76 y.o. year-old female presents to the ED with chief complaint of headache and elevated blood pressure.  She states that she has tightness in her upper back and neck.  She states that she has an associated headache.  Reports that her blood pressure today was high, and she came to the emergency department for evaluation.  She denies numbness, weakness, tingling, slurred speech, or vision changes.  She reports that she is paranoid that she is going to have a heart attack. ? ?History provided by patient. ? ? ?Review of Systems  ?Pertinent review of systems noted in HPI.  ? ? ?Physical Exam  ? ?Vitals:  ? 06/16/21 0130 06/16/21 0240  ?BP: (!) 148/86 (!) 142/92  ?Pulse: 70 73  ?Resp: 17 16  ?Temp:    ?SpO2: 98% 93%  ?  ?CONSTITUTIONAL:  well-appearing, NAD ?NEURO:  Alert and oriented x 3, CN 3-12 grossly intact ?EYES:  eyes equal and reactive ?ENT/NECK:  Supple, no stridor, cervical paraspinal muscles are tight and tender to palpation, no meningismus  ?CARDIO:  normal rate, regular rhythm, appears well-perfused  ?PULM:  No respiratory distress, CTAB ?GI/GU:  non-distended,  ?MSK/SPINE:  No gross deformities, no edema, moves all extremities ?SKIN:  no rash, atraumatic ? ? ?*Additional and/or pertinent findings included in MDM below ? ?Diagnostic and Interventional Summary  ? ? EKG Interpretation ? ?Date/Time:  Saturday June 16 2021 00:01:43 EST ?Ventricular Rate:  69 ?PR Interval:  162 ?QRS Duration: 93 ?QT Interval:  424 ?QTC Calculation: 455 ?R Axis:   11 ?Text Interpretation: Sinus rhythm Atrial premature complex Probable left atrial enlargement PACs not seen previously Confirmed by Molpus, John [phone]) on 06/16/2021 12:15:45 AM ?  ? ?  ? ?Labs Reviewed  ?CBC - Abnormal; Notable for the following components:   ?    Result Value  ? Hemoglobin 15.1 (*)   ? HCT 46.2 (*)   ? All other components within normal limits  ?BASIC METABOLIC PANEL  ?TROPONIN I (HIGH SENSITIVITY)  ?TROPONIN I (HIGH SENSITIVITY)  ?  ?CT HEAD WO CONTRAST (5MM)  ?Final Result  ?  ?DG Chest 2 View  ?Final Result  ?  ?  ?Medications  ?lidocaine (LIDODERM) 5 % 1 patch (1 patch Transdermal Patch Applied 06/16/21 0049)  ?  ? ?Procedures  /  Critical Care ?Procedures ? ?ED Course and Medical Decision Making  ?I have reviewed the triage vital signs, the nursing notes, and pertinent available records from the EMR. ? ?Complexity of Problems Addressed: ?High Complexity: Acute illness/injury posing a threat to life or bodily function, requiring emergent diagnostic workup, evaluation, and treatment as below. ?Comorbidities affecting this illness/injury include: ?HTN, depression, anxiety ?Social Determinants Affecting Care: ? ? ? ?ED Course: ?After considering the following differential, ACS, stroke, hypertensive emergency, I ordered labs and imaging. ?I personally interpreted the labs which are notable for negative trops, doubt ACS, normal creatinine, no leukocytosis ?I visualized the chest x-ray which is notable for no infiltrates and agree with the radiologist interpretation. ?I visualized the CT, which is notable for negative for bleed or obvious stroke and agree with radiologist interpretation.. ? ?  ? ?Consultants: ?No consultations were needed in caring for this patient. ? ?Treatment and Plan: ?Patient symptoms seem mostly consistent with tension headache  along with anxiety.  Her laboratory work-up is reassuring.  I do not think she needs any further emergent work-up.  Feel that she is stable for discharge and outpatient follow-up. ? ?Emergency department workup does not suggest an emergent condition requiring admission or immediate intervention beyond  what has been performed at this time. The patient is safe for discharge and has  been instructed to return  immediately for worsening symptoms, change in  symptoms or any other concerns ? ? ? ?Final Clinical Impressions(s) / ED Diagnoses  ? ?  ICD-10-CM   ?1. Tension headache  G44.209   ?  ?2. Anxiety  F41.9   ?  ?  ?ED Discharge Orders   ? ? None  ? ?  ?  ? ? ?Discharge Instructions Discussed with and Provided to Patient:  ? ?Discharge Instructions   ?None ?  ? ?  ?Montine Circle, PA-C ?06/16/21 0257 ? ?  ?Shanon Rosser, MD ?06/16/21 [phone] ? ?

## 2021-07-21 NOTE — Progress Notes (Signed)
? ? ?Patient ID: Kayla [name], female    DOB: [birth year]  MRN: [id number] ? ?CC: Pain ? ?Subjective: ?Kayla [name] is a 76 y.o. female who presents for pain.  ? ?Her concerns today include:  ?Reports has generalized pain, neuropathy, and headaches. Has not been seen by Neurology. However, Neurology has attempted to call patient  for scheduling without success due to unable to leave a voicemail. Has been unable to schedule appointment with Endocrinology reporting the provider she was referred to is retiring soon. Having gray tan vaginal discharge without additional symptoms. Has an appointment scheduled in June 2023 with Gynecology. Reports everything she eats makes her nauseated. Reports at recent physical exam thinks previous primary care doctor found something wrong with left lower stomach during annual physical exam but not exactly sure. Having blurred vision. Reports hair falling out. Reports tinnitus and was followed by ENT in the past. Reports concerns of skin itching and doesn't currently have a rash.  ?  ?Patient Active Problem List  ? Diagnosis Date Noted  ? Personal history of COVID-19 08/24/2020  ? Body mass index (BMI) 33.0-33.9, adult 06/20/2020  ? Status post lumbar microdiscectomy 06/20/2020  ? Essential (primary) hypertension 06/20/2020  ? Bilateral leg edema 06/17/2020  ? Cellulitis of leg, left 06/17/2020  ? Cellulitis of leg, right 06/17/2020  ? Lumbar herniated disc 05/25/2020  ? Hyponatremia 05/12/2020  ? Sciatica 05/12/2020  ? COVID-19 05/09/2020  ? Hypokalemia 05/04/2020  ? Dysphagia 04/17/2020  ? Renal insufficiency 12/29/2018  ? Cyst of left ovary 12/29/2018  ? Esophageal dysmotility 12/29/2018  ? 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/29/2018  ? Acquired hypothyroidism 12/29/2018  ? Chronic maxillary sinusitis 07/07/2018  ? Snoring 06/01/2018  ? Costochondritis 06/01/2018  ? Family history of heart disease 06/01/2018  ? Class 2 severe obesity due to excess calories with serious  comorbidity and body mass index (BMI) of 38.0 to 38.9 in adult [facility]) 06/01/2018  ? Multiple atypical skin moles 04/28/2017  ? Depression, recurrent ([place]) 07/16/2016  ? Adjustment disorder with mixed anxiety and depressed mood 01/15/2016  ? Fe Def Anemia 12/22/2015  ? Scoliosis (and kyphoscoliosis), idiopathic 12/20/2015  ? Osteopenia 12/20/2015  ? Fibromyalgia 08/02/2014  ? GAD (generalized anxiety disorder) 09/17/2012  ? Vitamin D deficiency 01/24/2012  ? Fatigue 10/14/2011  ? B12 deficiency   ? Hyperlipidemia 11/30/2007  ? Coronary artery disease involving native heart without angina pectoris 11/30/2007  ? Essential hypertension 01/27/2007  ? GERD 01/27/2007  ? IBS 01/27/2007  ?  ? ?Current Outpatient Medications on File Prior to Visit  ?Medication Sig Dispense Refill  ? acetaminophen (TYLENOL) 500 MG tablet Take 1,000 mg by mouth in the morning, at noon, in the evening, and at bedtime.    ? amLODipine (NORVASC) 5 MG tablet Take 1 tablet (5 mg total) by mouth daily. 90 tablet 3  ? Ascorbic Acid (VITAMIN C) 1000 MG tablet Take 500 mg by mouth daily.    ? Calcium-Magnesium-Zinc (CAL-MAG-ZINC PO) Take 1 tablet by mouth 2 (two) times daily.    ? Cholecalciferol (VITAMIN D3) 25 MCG (1000 UT) CAPS Take 1,000 Units by mouth daily.    ? ferrous sulfate 325 (65 FE) MG tablet Take 325 mg by mouth 2 (two) times daily with a meal.    ? fluticasone (FLONASE) 50 MCG/ACT nasal spray Place 2 sprays into both nostrils daily. 16 g 12  ? levothyroxine (SYNTHROID) 137 MCG tablet Take 1 tablet (137 mcg total) by mouth daily before breakfast. 90  tablet 0  ? nystatin (MYCOSTATIN/NYSTOP) powder Apply 1 application topically 3 (three) times daily. 30 g 0  ? nystatin-triamcinolone (MYCOLOG II) cream Apply to affected area twice daily 30 g 0  ? polymixin-bacitracin (POLYSPORIN) 500-10000 UNIT/GM OINT ointment Apply 1 application topically 2 (two) times daily. 14 g 0  ? pyridOXINE (VITAMIN B-6) 100 MG tablet Take 100 mg by mouth daily.    ?  triamcinolone cream (KENALOG) 0.1 % Apply 1 application topically 2 (two) times daily. 30 g 0  ? vitamin B-12 (CYANOCOBALAMIN) 500 MCG tablet Take 1 tablet (500 mcg total) by mouth daily. 90 tablet 0  ? ?No current facility-administered medications on file prior to visit.  ? ? ?Allergies  ?Allergen Reactions  ? Ace Inhibitors Anaphylaxis  ?  REACTION: glossal edema  ? Lisinopril Anaphylaxis  ? ? ?Social History  ? ?Socioeconomic History  ? Marital status: Widowed  ?  Spouse name: Not on file  ? Number of children: 2  ? Years of education: Not on file  ? Highest education level: Bachelor's degree (e.g., BA, AB, BS)  ?Occupational History  ? Not on file  ?Tobacco Use  ? Smoking status: Never  ?  Passive exposure: Never  ? Smokeless tobacco: Never  ?Vaping Use  ? Vaping Use: Never used  ?Substance and Sexual Activity  ? Alcohol use: No  ? Drug use: No  ? Sexual activity: Not Currently  ?  Birth control/protection: Post-menopausal  ?Other Topics Concern  ? Not on file  ?Social History Narrative  ? Lost husband 2009, lost mother 2016  ?   ? Lives at home (son & 3 grandchildren live with her)  ? Caffeine: 10-12 oz daily  ? ?Social Determinants of Health  ? ?Financial Resource Strain: Not on file  ?Food Insecurity: Not on file  ?Transportation Needs: Not on file  ?Physical Activity: Not on file  ?Stress: Not on file  ?Social Connections: Not on file  ?Intimate Partner Violence: Not on file  ? ? ?Family History  ?Problem Relation Age of Onset  ? Heart attack Father 36  ? Lung cancer Other   ?     uncle, non smoker  ? Leukemia Other   ?     uncle  ? Cancer Mother   ?     oral  ? Stroke Mother   ? Alzheimer's disease Mother   ?     at 68  ? Cancer Maternal Aunt   ?     breast  ? Alcohol abuse Maternal Aunt   ? Alcohol abuse Maternal Uncle   ? Cancer Maternal Uncle   ?     lung, stomach, oral  ? Cancer Paternal Uncle   ?     lung  ? Congestive Heart Failure Maternal Grandfather   ? Heart disease Maternal Grandfather   ? Heart  attack Paternal Grandfather   ? Cancer Paternal Uncle   ?     bone  ? Brain cancer Maternal Grandmother   ? Alzheimer's disease Other   ?     maternal great grandmother   ? Colon cancer Neg Hx   ? Breast cancer Neg Hx   ? Diabetes Neg Hx   ? ? ?Past Surgical History:  ?Procedure Laterality Date  ? CESAREAN SECTION  06/10/1975  ? CHOLECYSTECTOMY    ? LUMBAR LAMINECTOMY/DECOMPRESSION MICRODISCECTOMY Right 05/25/2020  ? Procedure: MICRODISCECTOMY Right Lumbar three - four;  Surgeon: Newman Pies, MD;  Location: [place];  Service: Neurosurgery;  Laterality: Right;  ? NECK SURGERY  1998  ? RADIOLOGY WITH ANESTHESIA N/A 05/16/2020  ? Procedure: MRI LUMBAR SPINE W/CONTRAST;  Surgeon: Radiologist, Medication, MD;  Location: [place];  Service: Radiology;  Laterality: N/A;  ? repair broken C6 & C7    ? ? ?ROS: ?Review of Systems ?Negative except as stated above ? ?PHYSICAL EXAM: ?BP 126/81 (BP Location: Left Arm, Patient Position: Sitting, Cuff Size: Large)   Pulse 80   Temp 98.3 ?F (36.8 ?C)   Resp 18   Ht 5' 4.02" (1.626 m)   Wt 190 lb (86.2 kg)   SpO2 96%   BMI 32.60 kg/m?  ? ?Physical Exam ?HENT:  ?   Head: Normocephalic and atraumatic.  ?Eyes:  ?   Extraocular Movements: Extraocular movements intact.  ?   Conjunctiva/sclera: Conjunctivae normal.  ?   Pupils: Pupils are equal, round, and reactive to light.  ?Cardiovascular:  ?   Rate and Rhythm: Normal rate and regular rhythm.  ?   Pulses: Normal pulses.  ?   Heart sounds: Normal heart sounds.  ?Pulmonary:  ?   Effort: Pulmonary effort is normal.  ?   Breath sounds: Normal breath sounds.  ?Musculoskeletal:  ?   Cervical back: Normal range of motion and neck supple.  ?Neurological:  ?   General: No focal deficit present.  ?   Mental Status: She is alert.  ?Psychiatric:     ?   Mood and Affect: Mood normal.     ?   Behavior: Behavior normal.  ? ?Results for orders placed or performed in visit on 07/27/21  ?POCT URINALYSIS DIP (CLINITEK)  ?Result Value Ref Range  ?  Color, UA yellow yellow  ? Clarity, UA clear clear  ? Glucose, UA negative negative mg/dL  ? Bilirubin, UA small (A) negative  ? Ketones, POC UA trace (5) (A) negative mg/dL  ? Spec Grav, UA 1.025 1.010 - 1.025  ? [place]

## 2021-07-27 ENCOUNTER — Ambulatory Visit: Payer: Medicare PPO | Admitting: Family

## 2021-07-27 ENCOUNTER — Other Ambulatory Visit (HOSPITAL_COMMUNITY)
Admission: RE | Admit: 2021-07-27 | Discharge: 2021-07-27 | Disposition: A | Discharge status: Home / Self Care | Payer: Medicare PPO | Source: Ambulatory Visit | Attending: Family | Admitting: Family

## 2021-07-27 ENCOUNTER — Encounter: Payer: Self-pay | Admitting: Family

## 2021-07-27 VITALS — BP 126/81 | HR 80 | Temp 98.3°F | Resp 18 | Ht 64.02 in | Wt 190.0 lb

## 2021-07-27 DIAGNOSIS — E039 Hypothyroidism, unspecified: Secondary | ICD-10-CM

## 2021-07-27 DIAGNOSIS — G5793 Unspecified mononeuropathy of bilateral lower limbs: Secondary | ICD-10-CM

## 2021-07-27 DIAGNOSIS — N898 Other specified noninflammatory disorders of vagina: Secondary | ICD-10-CM | POA: Diagnosis not present

## 2021-07-27 DIAGNOSIS — L299 Pruritus, unspecified: Secondary | ICD-10-CM

## 2021-07-27 DIAGNOSIS — Z0100 Encounter for examination of eyes and vision without abnormal findings: Secondary | ICD-10-CM | POA: Diagnosis not present

## 2021-07-27 DIAGNOSIS — Z113 Encounter for screening for infections with a predominantly sexual mode of transmission: Secondary | ICD-10-CM | POA: Insufficient documentation

## 2021-07-27 DIAGNOSIS — G894 Chronic pain syndrome: Secondary | ICD-10-CM | POA: Diagnosis not present

## 2021-07-27 DIAGNOSIS — L659 Nonscarring hair loss, unspecified: Secondary | ICD-10-CM | POA: Diagnosis not present

## 2021-07-27 DIAGNOSIS — R110 Nausea: Secondary | ICD-10-CM | POA: Diagnosis not present

## 2021-07-27 DIAGNOSIS — R109 Unspecified abdominal pain: Secondary | ICD-10-CM

## 2021-07-27 DIAGNOSIS — H9319 Tinnitus, unspecified ear: Secondary | ICD-10-CM

## 2021-07-27 DIAGNOSIS — R519 Headache, unspecified: Secondary | ICD-10-CM

## 2021-07-27 LAB — POCT URINALYSIS DIP (CLINITEK)
Blood, UA: NEGATIVE
Glucose, UA: NEGATIVE mg/dL
Nitrite, UA: NEGATIVE
POC PROTEIN,UA: 30 — AB
Spec Grav, UA: 1.025 (ref 1.010–1.025)
Urobilinogen, UA: 1 E.U./dL
pH, UA: 5.5 (ref 5.0–8.0)

## 2021-07-27 NOTE — Progress Notes (Signed)
Sending urine culture for further evaluation.

## 2021-07-27 NOTE — Progress Notes (Signed)
Pt presents for follow-up on variety of issues  ?

## 2021-07-29 LAB — URINE CULTURE

## 2021-07-30 LAB — CERVICOVAGINAL ANCILLARY ONLY
Bacterial Vaginitis (gardnerella): NEGATIVE
Candida Glabrata: NEGATIVE
Candida Vaginitis: NEGATIVE
Chlamydia: NEGATIVE
Comment: NEGATIVE
Comment: NEGATIVE
Comment: NEGATIVE
Comment: NEGATIVE
Comment: NEGATIVE
Comment: NORMAL
Neisseria Gonorrhea: NEGATIVE
Trichomonas: NEGATIVE

## 2021-07-30 NOTE — Progress Notes (Signed)
Urine culture normal.

## 2021-07-30 NOTE — Progress Notes (Signed)
Gonorrhea, Chlamydia, Trichomonas, Bacterial Vaginitis, Candida Vaginitis (yeast infection) negative.

## 2021-08-01 ENCOUNTER — Telehealth: Payer: Self-pay | Admitting: Family

## 2021-08-01 ENCOUNTER — Other Ambulatory Visit: Payer: Self-pay | Admitting: Family

## 2021-08-01 DIAGNOSIS — M1990 Unspecified osteoarthritis, unspecified site: Secondary | ICD-10-CM

## 2021-08-01 NOTE — Telephone Encounter (Signed)
Rheumatology referral ordered per patient request.

## 2021-08-01 NOTE — Telephone Encounter (Signed)
Copied from [place] [phone]. Topic: Referral - Request for Referral ?>> Aug 01, 2021  3:49 PM Erick Blinks wrote: ?Has patient seen PCP for this complaint? Yes ?*If NO, is insurance requiring patient see PCP for this issue before PCP can refer them? ?Referral for which specialty: Rheumatology  ?Preferred provider/office: Dr. Kathlene [facility])  ?Reason for referral: Inflammatory osteoarthritis, pain increasing ?

## 2021-08-03 NOTE — Telephone Encounter (Signed)
Pt stated she has not heard back regarding her referral request, please advise.  ?

## 2021-08-14 ENCOUNTER — Other Ambulatory Visit: Payer: Self-pay | Admitting: Family Medicine

## 2021-08-14 ENCOUNTER — Telehealth: Payer: Self-pay | Admitting: Family Medicine

## 2021-08-14 ENCOUNTER — Other Ambulatory Visit: Payer: Self-pay | Admitting: Family

## 2021-08-14 DIAGNOSIS — E039 Hypothyroidism, unspecified: Secondary | ICD-10-CM

## 2021-08-14 DIAGNOSIS — R109 Unspecified abdominal pain: Secondary | ICD-10-CM

## 2021-08-14 DIAGNOSIS — R110 Nausea: Secondary | ICD-10-CM

## 2021-08-14 NOTE — Telephone Encounter (Signed)
Copied from [place] [id number]. Topic: Referral - Status ?>> Aug 13, 2021  2:38 PM Yvette Rack wrote: ?Reason for CRM: Pt stated she was referred to [facility] but she would prefer to go to [facility] because they already have records and she will not have to start over. ? ?Please send a referral to [facility] instead of [place] per patients request ?

## 2021-08-14 NOTE — Telephone Encounter (Signed)
[facility] referral placed per patient request.

## 2021-08-14 NOTE — Telephone Encounter (Signed)
Medication Refill - Medication: levothyroxine (SYNTHROID) 137 MCG tablet ? ?Has the patient contacted their pharmacy? Yes.   ?(Agent: If no, request that the patient contact the pharmacy for the refill. If patient does not wish to contact the pharmacy document the reason why and proceed with request.) ?(Agent: If yes, when and what did the pharmacy advise?) ? ?Preferred Pharmacy (with phone number or street name):  ?[id number] [id number] ?Phone: [phone]: [phone] ? ?Has the patient been seen for an appointment in the last year OR does the patient have an upcoming appointment? Yes.   ? ?Agent: Please be advised that RX refills may take up to 3 business days. We ask that you follow-up with your pharmacy. ? ?

## 2021-08-15 MED ORDER — LEVOTHYROXINE SODIUM 137 MCG PO TABS: 137.0000 ug | ORAL_TABLET | Freq: Every day | ORAL | 0 refills | Status: DC

## 2021-08-15 NOTE — Telephone Encounter (Signed)
Requested medications are due for refill today.  yes ? ?Requested medications are on the active medications list.  yes ? ?Last refill. 02/28/2021 #90 0 refills ? ?Future visit scheduled.   no ? ?Notes to clinic.  Medication refill failed protocol d/t abnormal labs. ? ? ? ?Requested Prescriptions  ?Pending Prescriptions Disp Refills  ? levothyroxine (SYNTHROID) 137 MCG tablet 90 tablet 0  ?  Sig: Take 1 tablet (137 mcg total) by mouth daily before breakfast.  ?  ? Endocrinology:  Hypothyroid Agents Failed - 08/15/2021  1:52 PM  ?  ?  Failed - TSH in normal range and within 360 days  ?  TSH  ?Date Value Ref Range Status  ?03/27/2021 7.950 (H) 0.450 - 4.500 uIU/mL Final  ?  ?  ?  ?  Passed - Valid encounter within last 12 months  ?  Recent Outpatient Visits   ? ?      ? 2 weeks ago Chronic pain syndrome  ? Primary Care at [facility], Amy J, NP  ? 3 months ago Neuropathic pain of both legs  ? Primary Care at [facility] [place], NP  ? 4 months ago Annual physical exam  ? Primary Care at [facility], MD  ? 6 months ago Dysuria  ? Primary Care at [facility], MD  ? 11 months ago Bilateral leg pain  ? Primary Care at [facility], Bayard Beaver, MD  ? ?  ?  ? ? ?  ?  ?  ?  ?

## 2021-08-15 NOTE — Telephone Encounter (Signed)
Requested Prescriptions  ?Pending Prescriptions Disp Refills  ?? levothyroxine (SYNTHROID) 137 MCG tablet 90 tablet 0  ?  Sig: Take 1 tablet (137 mcg total) by mouth daily before breakfast.  ?  ? Endocrinology:  Hypothyroid Agents Failed - 08/15/2021  3:09 PM  ?  ?  Failed - TSH in normal range and within 360 days  ?  TSH  ?Date Value Ref Range Status  ?03/27/2021 7.950 (H) 0.450 - 4.500 uIU/mL Final  ?   ?  ?  Passed - Valid encounter within last 12 months  ?  Recent Outpatient Visits   ?      ? 2 weeks ago Chronic pain syndrome  ? Primary Care at [facility], Amy J, NP  ? 3 months ago Neuropathic pain of both legs  ? Primary Care at [facility] [place], NP  ? 4 months ago Annual physical exam  ? Primary Care at [facility], MD  ? 6 months ago Dysuria  ? Primary Care at [facility], MD  ? 11 months ago Bilateral leg pain  ? Primary Care at [facility], Bayard Beaver, MD  ?  ?  ? ?  ?  ?  ? ?

## 2021-08-15 NOTE — Telephone Encounter (Signed)
Patient called in to inform Amy Minette Brine that she need Rx for Thyroid medication sent to her pharmacy today since she is completely out. Please advise and call patient  ?

## 2021-08-27 DIAGNOSIS — R5383 Other fatigue: Secondary | ICD-10-CM | POA: Diagnosis not present

## 2021-08-27 DIAGNOSIS — E039 Hypothyroidism, unspecified: Secondary | ICD-10-CM | POA: Diagnosis not present

## 2021-09-06 ENCOUNTER — Ambulatory Visit: Payer: Medicare PPO | Admitting: Neurology

## 2021-09-06 ENCOUNTER — Encounter: Payer: Self-pay | Admitting: Neurology

## 2021-09-06 VITALS — BP 166/88 | HR 91 | Ht 64.0 in | Wt 211.6 lb

## 2021-09-06 DIAGNOSIS — M79605 Pain in left leg: Secondary | ICD-10-CM

## 2021-09-06 DIAGNOSIS — M79604 Pain in right leg: Secondary | ICD-10-CM | POA: Diagnosis not present

## 2021-09-06 MED ORDER — GABAPENTIN 100 MG PO CAPS: 100.0000 mg | ORAL_CAPSULE | Freq: Three times a day (TID) | ORAL | 3 refills | Status: DC

## 2021-09-06 NOTE — Progress Notes (Unsigned)
[facility]    Provider:  Dr Jaynee Eagles Requesting Provider: Camillia Herter, NP Primary Care Provider:  Camillia Herter, NP  CC:  lower extremity swelling  Follow up 09/09/2021 for new complaint of LE pain and swelling: She had surgery on her back with Dr. Arnoldo Morale, she had extreme sciatic nerve pain, she was in the hospital for 2 weeks, had cellulistis and started since then, she says she has dyesthesias, started after cellulitis after surgery in 2022 for low back pain, her hair fell out, she has burning and stinging from knees to the toes, feels like she is on fire, like she has been scalded, her legs are swollen, she has pain, she saw cardiology, she cant stand anything touching her legs, started aftr cellulitis. Her legs are red, redness is worsening, from the knees down. She has electrical shocks up the legs. In the calf. Swelling is getting worse. In the morning without swelling she has no stabbing/electrical pain. But she is always is in pain 24x7 in her body she says.   Patient complains of symptoms per HPI as well as the following symptoms: leg pain . Pertinent negatives and positives per HPI. All others negative   HPI 12/06/2018:   HPI:  Kayla [name] is a 76 y.o. female here as requested by Camillia Herter, NP for headaches. PMHx fibromyalgia, gerd, hld, htn, hypothyroid, anxiety and depression, ascending aortic aneurysm, she says she is not here for headache but for concerns of memory. Also started losing hearing in the left ear. Also felt a know in her neck. She does not want to age, she does not want to have normal cognitive aging. She sees a big difference, she does sudoku, she loves politics, her mother had dementia. She loves to learn, she is a "bibliophile", she reads a lot, sometimes some word finding difficulty. She may repeat things because her son will say he already told her but she thinks it is her son he is disabled. She feels she loses her words but she is a good  historian, she cares for her son, she performs all her ADLs and IADLs.  Mother lived to be 39 an dthen diagnosed with Alzheimers. No other focal neurologic deficits, associated symptoms, inciting events or modifiable factors.  Reviewed notes, labs and imaging from outside physicians, which showed:  Reviewed  MRI brain 2000 report:  THERE IS NO EVIDENCE OF A CEREBELLOPONTINE ANGLE MASS.   THE CAROTID ARTERIES HAVE A SYMMETRICAL APPEARANCE.   THERE IS NO EVIDENCE OF AN INFARCT.   NO EVIDENCE OF ABNORMAL INTRACRANIAL ENHANCING LESION.   NO EVIDENCE OF ABNORMAL SIGNAL INTENSITY TO SUGGEST A DEMYELINATING PROCESS.   THERE IS A PARTIALLY EMPTY SELLA.   THE PINEAL REGION AND CERVICOMEDULLARY JUNCTION ARE UNREMARKABLE.   THE MAJOR VASCULAR STRUCTURES ARE PATENT. THE ORBITAL STRUCTURES ARE WITHIN NORMAL LIMITS.  NO EVIDENCE OF SKULL BASE ABNORMALITY.   VISUALIZED SINUSES AND MASTOID AIR CELLS ARE CLEAR. IMPRESSION MR OF THE BRAIN REVEALS A PARTIAL EMPTY SELLA BUT IS OTHERWISE UNREMARKABLE.  Review of Systems: Patient complains of symptoms per HPI as well as the following symptoms: memory . Pertinent negatives and positives per HPI. All others negative.   Social History   Socioeconomic History   Marital status: Widowed    Spouse name: Not on file   Number of children: 2   Years of education: Not on file   Highest education level: Bachelor's degree (e.g., BA, AB, BS)  Occupational History  Not on file  Tobacco Use   Smoking status: Never    Passive exposure: Never   Smokeless tobacco: Never  Vaping Use   Vaping Use: Never used  Substance and Sexual Activity   Alcohol use: No   Drug use: No   Sexual activity: Not Currently    Birth control/protection: Post-menopausal  Other Topics Concern   Not on file  Social History Narrative   Lost husband 2009, lost mother 2016      Lives at home (son & 3 grandchildren live with her)   Caffeine: 10-12 oz daily   Social Determinants of Health    Financial Resource Strain: Not on file  Food Insecurity: Not on file  Transportation Needs: Not on file  Physical Activity: Not on file  Stress: Not on file  Social Connections: Not on file  Intimate Partner Violence: Not on file    Family History  Problem Relation Age of Onset   Heart attack Father 48   Lung cancer Other        uncle, non smoker   Leukemia Other        uncle   Cancer Mother        oral   Stroke Mother    Alzheimer's disease Mother        at 80   Cancer Maternal Aunt        breast   Alcohol abuse Maternal Aunt    Alcohol abuse Maternal Uncle    Cancer Maternal Uncle        lung, stomach, oral   Cancer Paternal Uncle        lung   Congestive Heart Failure Maternal Grandfather    Heart disease Maternal Grandfather    Heart attack Paternal Grandfather    Cancer Paternal Uncle        bone   Brain cancer Maternal Grandmother    Alzheimer's disease Other        maternal great grandmother    Colon cancer Neg Hx    Breast cancer Neg Hx    Diabetes Neg Hx     Past Medical History:  Diagnosis Date   Anemia    Anxiety    Asthma    B12 deficiency    Bursitis    Chronic LBP    Chronic UTI    Diastolic dysfunction    Fibromyalgia    GERD (gastroesophageal reflux disease)    History of low potassium    Hyperlipemia    Hypertension    Hypothyroid    IBS (irritable bowel syndrome)    Inflammatory osteoarthritis    Kidney infection    Psoriatic arthritis ([place])    S/P cardiac cath 11/30/2007   normal coronaries - Dr. Burt Knack (Dr. Debara Pickett reviewed films on 01/10/2016)    Patient Active Problem List   Diagnosis Date Noted   Personal history of COVID-19 08/24/2020   Body mass index (BMI) 33.0-33.9, adult 06/20/2020   Status post lumbar microdiscectomy 06/20/2020   Essential (primary) hypertension 06/20/2020   Bilateral leg edema 06/17/2020   Cellulitis of leg, left 06/17/2020   Cellulitis of leg, right 06/17/2020   Lumbar herniated disc 05/25/2020    Hyponatremia 05/12/2020   Sciatica 05/12/2020   COVID-19 05/09/2020   Hypokalemia 05/04/2020   Dysphagia 04/17/2020   Renal insufficiency 12/29/2018   Cyst of left ovary 12/29/2018   Esophageal dysmotility 12/29/2018   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/29/2018   Acquired hypothyroidism 12/29/2018   Chronic maxillary sinusitis 07/07/2018  Snoring 06/01/2018   Costochondritis 06/01/2018   Family history of heart disease 06/01/2018   Class 2 severe obesity due to excess calories with serious comorbidity and body mass index (BMI) of 38.0 to 38.9 in adult ([place]) 06/01/2018   Multiple atypical skin moles 04/28/2017   Depression, recurrent ([place]) 07/16/2016   Adjustment disorder with mixed anxiety and depressed mood 01/15/2016   Fe Def Anemia 12/22/2015   Scoliosis (and kyphoscoliosis), idiopathic 12/20/2015   Osteopenia 12/20/2015   Fibromyalgia 08/02/2014   GAD (generalized anxiety disorder) 09/17/2012   Vitamin D deficiency 01/24/2012   Fatigue 10/14/2011   B12 deficiency    Hyperlipidemia 11/30/2007   Coronary artery disease involving native heart without angina pectoris 11/30/2007   Essential hypertension 01/27/2007   GERD 01/27/2007   IBS 01/27/2007    Past Surgical History:  Procedure Laterality Date   CESAREAN SECTION  06/10/1975   CHOLECYSTECTOMY     LUMBAR LAMINECTOMY/DECOMPRESSION MICRODISCECTOMY Right 05/25/2020   Procedure: MICRODISCECTOMY Right Lumbar three - four;  Surgeon: [place] Pies, MD;  Location: [facility];  Service: Neurosurgery;  Laterality: Right;   NECK SURGERY  1998   RADIOLOGY WITH ANESTHESIA N/A 05/16/2020   Procedure: MRI LUMBAR SPINE W/CONTRAST;  Surgeon: Radiologist, Medication, MD;  Location: [facility];  Service: Radiology;  Laterality: N/A;   repair broken C6 & C7      Current Outpatient Medications  Medication Sig Dispense Refill   acetaminophen (TYLENOL) 500 MG tablet Take 1,000 mg by mouth in the morning, at noon, in the  evening, and at bedtime.     amLODipine (NORVASC) 5 MG tablet Take 1 tablet (5 mg total) by mouth daily. 90 tablet 3   Calcium-Magnesium-Zinc (CAL-MAG-ZINC PO) Take 1 tablet by mouth 2 (two) times daily.     Cholecalciferol (VITAMIN D3) 25 MCG (1000 UT) CAPS Take 1,000 Units by mouth daily.     ferrous sulfate 325 (65 FE) MG tablet Take 325 mg by mouth 2 (two) times daily with a meal.     gabapentin (NEURONTIN) 100 MG capsule Take 1 capsule (100 mg total) by mouth 3 (three) times daily. 90 capsule 3   levothyroxine (SYNTHROID) 137 MCG tablet Take 1 tablet (137 mcg total) by mouth daily before breakfast. 90 tablet 0   pyridOXINE (VITAMIN B-6) 100 MG tablet Take 100 mg by mouth daily.     vitamin B-12 (CYANOCOBALAMIN) 500 MCG tablet Take 1 tablet (500 mcg total) by mouth daily. 90 tablet 0   Ascorbic Acid (VITAMIN C) 1000 MG tablet Take 500 mg by mouth daily. (Patient not taking: Reported on 09/06/2021)     fluticasone (FLONASE) 50 MCG/ACT nasal spray Place 2 sprays into both nostrils daily. (Patient not taking: Reported on 09/06/2021) 16 g 12   nystatin (MYCOSTATIN/NYSTOP) powder Apply 1 application topically 3 (three) times daily. (Patient not taking: Reported on 09/06/2021) 30 g 0   nystatin-triamcinolone (MYCOLOG II) cream Apply to affected area twice daily (Patient not taking: Reported on 09/06/2021) 30 g 0   polymixin-bacitracin (POLYSPORIN) 500-10000 UNIT/GM OINT ointment Apply 1 application topically 2 (two) times daily. (Patient not taking: Reported on 09/06/2021) 14 g 0   triamcinolone cream (KENALOG) 0.1 % Apply 1 application topically 2 (two) times daily. (Patient not taking: Reported on 09/06/2021) 30 g 0   No current facility-administered medications for this visit.    Allergies as of 09/06/2021 - Review Complete 09/06/2021  Allergen Reaction Noted   Ace inhibitors Anaphylaxis 05/12/2006   Lisinopril Anaphylaxis 07/05/2016  Vitals: BP (!) 166/88 (BP Location: Right Arm, Patient Position:  Sitting, Cuff Size: Large)   Pulse 91   Ht '5\' 4"'$  (1.626 m)   Wt 211 lb 9.6 oz (96 kg)   BMI 36.32 kg/m  Last Weight:  Wt Readings from Last 1 Encounters:  09/06/21 211 lb 9.6 oz (96 kg)   Last Height:   Ht Readings from Last 1 Encounters:  09/06/21 '5\' 4"'$  (1.626 m)    Physical exam: Exam: Gen: NAD, conversant, obese, well groomed                     CV: RRR, no MRG. No Carotid Bruits. Distal swelling with  Eyes: Conjunctivae clear without exudates or hemorrhage Skin: The bilateral lower legs are edematous, pitting edema, warm, chronic skin changes noted with reddish discoloration however I could feel the dorsalis pedis bilaterally, no calf tenderness.  Neuro: Detailed Neurologic Exam  Speech:    Speech is normal; fluent and spontaneous with normal comprehension.  Cognition:    The patient is oriented to person, place, and time;     recent and remote memory intact;     language fluent;     normal attention, concentration,     fund of knowledge Cranial Nerves:    The pupils are equal, round, and reactive to light. Pupils too small to visualize fundi, attempted Visual fields are full to finger confrontation. Extraocular movements are intact. Trigeminal sensation is intact and the muscles of mastication are normal. The face is symmetric. The palate elevates in the midline. Hearing impaired. Voice is normal. Shoulder shrug is normal. The tongue has normal motion without fasciculations.   Coordination:    Normal   Gait:    normal.   Motor Observation:    No asymmetry, no atrophy, and no involuntary movements noted. Tone:    Normal muscle tone.    Posture:    Posture is normal. normal erect    Strength:    Difficulty with strength testing LE due to pain but no focal deficits, uppers intact.     Sensation: sensation intact to pin prick, cold temp,  few seconds vibration at great toe, proprioception intact,  Hyperalgesia and allodynia lower extremities     Reflex  Exam:  DTR's:    Could not perform LE reflexes due to pain, 2+ biceps Toes:    The toes are downgoing bilaterally.   Clonus:    Clonus is absent.     Assessment/Plan:   76 y.o. female here as requested for a new complaint of lower extremity pain and swelling after cellulitis. Consider Complex Regional Pain Syndrome (CRPS), patient has already been evaluated by vascular without evidence of DVTs.  Distal Lower extremity Intact sensation but with hyperalgesia, allodynia, swelling, pain appears out of proportion to symptoms, skin color changes, edema - consider complex regional pain syndrome  In April was sent to [facility] in pain management, treatment for CRPS is in pain management, she states she did not know she had a referral there, I printed out Dr. Doren Custard name and recommended she call for appointment; pain centers can provide sympathetic blocks to help with pain and try to confirm diagnosis. Start Gabapentin and can increase as tolerated/needed by pain management.    Meds ordered this encounter  Medications   gabapentin (NEURONTIN) 100 MG capsule    Sig: Take 1 capsule (100 mg total) by mouth 3 (three) times daily.    Dispense:  90 capsule  Refill:  3     Cc: Camillia Herter, NP,  Dr. Marciano Sequin (I forwarded my note to Dr. Mee Hives)  Sarina Ill, MD  [facility] [address] [place], [place] [address]  Phone [phone] Fax [phone]  I spent over 30 minutes of face-to-face and non-face-to-face time with patient on the  1. Pain in both lower extremities    diagnosis.  This included previsit chart review, lab review, study review, order entry, electronic health record documentation, patient education on the different diagnostic and therapeutic options, counseling and coordination of care, risks and benefits of management, compliance, or risk factor reduction

## 2021-09-06 NOTE — Patient Instructions (Addendum)
Call Marciano Sequin, was already referred to pain management Followup with Dr. Kathlene November in Rheumatology Follow up with Dermatology Can try Gabapentin in the meantime but needs to follow up with Pain Management  Complex Regional Pain Syndrome Complex regional pain syndrome (CRPS) is a nerve disorder that is characterized by long-term (chronic) pain. The pain is usually in a hand, arm, foot, or leg. CRPS usually occurs after an injury or trauma, such as a fracture or sprain. There are two types of CRPS: Type 1. This type occurs after an injury with no known damage to a nerve. Type 2. This type occurs after an injury that damages a nerve. There are three stages of the condition: Stage 1. This stage, called the acute stage, may last for up to 3 months. Stage 2. This stage, called the dystrophic stage, may last for 3-12 months. Stage 3. This stage, called the atrophic stage, may start after one year. CRPS ranges from mild to severe. For most people, CRPS is mild and recovery happens over time. For others, CRPS lasts for a very long time and makes it hard to do everyday tasks. What are the causes? The exact cause of this condition is not known. It is usually triggered by an injury. What increases the risk? You are more likely to develop this condition if: You are female. You have any of the following: A wrist fracture that involves a lower arm bone (distal radius fracture). Ankle dislocation or fracture. A long surgery time. Possible nerve injury during surgery. What are the signs or symptoms? Signs and symptoms in the affected hand, arm, foot, or leg are different for each stage. Signs and symptoms of stage 1 include: Spontaneous pain that feels like a burning or prickling, tingling feeling (pins and needles sensation). Extremely sensitive skin. Swelling. Joint stiffness. Warmth and redness. Excessive sweating. Hair and nail growth that is faster than normal. Signs and symptoms of stage 2  include: Spreading of pain to the whole arm or leg. Increased skin sensitivity. Increased swelling and stiffness. Coolness of the skin. Blue discoloration of skin. Loss of skin wrinkles. Brittle fingernails. Signs and symptoms of stage 3 include: Pain that spreads to other areas of the body but becomes less severe. More stiffness, leading to loss of motion. Skin that is pale, dry, shiny, or tightly stretched. How is this diagnosed? This condition may be diagnosed based on: Your signs and symptoms. A physical exam. There is no test to diagnose CRPS, but you may have tests: To check for bone changes that might indicate CRPS. These tests may include an MRI or bone scan. To rule out other possible causes of your symptoms. How is this treated? Early treatment may prevent CRPS from advancing past stage 1. There is not one treatment that works for everyone. Treatment options may include: Medicines, which may include: NSAIDs, such as ibuprofen. Steroids. Blood pressure drugs. Antidepressants. Anti-seizure drugs. Pain relievers. Exercise. Occupational therapy and physical therapy. Biofeedback. Mental health counseling. Numbing injections. Spinal surgery to implant a spinal cord stimulator or a pain pump. Follow these instructions at home: Medicines Take over-the-counter and prescription medicines only as told by your health care provider. Ask your health care provider if the medicine prescribed to you: Requires you to avoid driving or using machinery. Can cause constipation. You may need to take these actions to prevent or treat constipation: Drink enough fluid to keep your urine pale yellow. Take over-the-counter or prescription medicines. Eat foods that are high in fiber, such as  beans, whole grains, and fresh fruits and vegetables. Limit foods that are high in fat and processed sugars, such as fried or sweet foods. General instructions Do not use any products that contain  nicotine or tobacco. These products include cigarettes, chewing tobacco, and vaping devices, such as e-cigarettes. If you need help quitting, ask your health care provider. Maintain a healthy weight. Return to your normal activities as told by your health care provider. Ask your health care provider what activities are safe for you. Do exercises as told by your health care provider. Keep all follow-up visits. This is important. Where to find more information [employer] of Neurological Disorders and Stroke: [employer] Contact a health care provider if: Your symptoms change. Your symptoms get worse. You develop anxiety or depression. Get help right away if: Your pain is making you want to harm yourself. Get help right away if you feel like you may hurt yourself or others, or have thoughts about taking your own life. Go to your nearest emergency room or: Call 911. Call the [facility] at [phone] or 988. This is open 24 hours a day. Text the Crisis Text Line at 858-811-1819. Summary Complex regional pain syndrome (CRPS) is a nerve disorder that causes long-term (chronic) pain, usually in a hand, arm, leg, or foot. CRPS usually occurs after an injury or trauma, such as a fracture or sprain. CRPS ranges from mild to severe. Early treatment may prevent CRPS from advancing to more severe stages. This information is not intended to replace advice given to you by your health care provider. Make sure you discuss any questions you have with your health care provider. Document Revised: 11/22/2020 Document Reviewed: 11/22/2020 Elsevier Patient Education  [employer] [place].  Gabapentin Capsules or Tablets What is this medication? GABAPENTIN (GA ba pen tin) treats nerve pain. It may also be used to prevent and control seizures in people with epilepsy. It works by calming overactive nerves in your body. This medicine may be used for other purposes; ask your health  care provider or pharmacist if you have questions. COMMON BRAND NAME(S): Active-PAC with Gabapentin, Orpha Bur, Gralise, Neurontin What should I tell my care team before I take this medication? They need to know if you have any of these conditions: Alcohol or substance use disorder Kidney disease Lung or breathing disease Suicidal thoughts, plans, or attempt; a previous suicide attempt by you or a family member An unusual or allergic reaction to gabapentin, other medications, foods, dyes, or preservatives Pregnant or trying to get pregnant Breast-feeding How should I use this medication? Take this medication by mouth with a glass of water. Follow the directions on the prescription label. You can take it with or without food. If it upsets your stomach, take it with food. Take your medication at regular intervals. Do not take it more often than directed. Do not stop taking except on your care team's advice. If you are directed to break the 600 or 800 mg tablets in half as part of your dose, the extra half tablet should be used for the next dose. If you have not used the extra half tablet within 28 days, it should be thrown away. A special MedGuide will be given to you by the pharmacist with each prescription and refill. Be sure to read this information carefully each time. Talk to your care team about the use of this medication in children. While this medication may be prescribed for children as young as 3 years for selected  conditions, precautions do apply. Overdosage: If you think you have taken too much of this medicine contact a poison control center or emergency room at once. NOTE: This medicine is only for you. Do not share this medicine with others. What if I miss a dose? If you miss a dose, take it as soon as you can. If it is almost time for your next dose, take only that dose. Do not take double or extra doses. What may interact with this medication? Alcohol Antihistamines for allergy,  cough, and cold Certain medications for anxiety or sleep Certain medications for depression like amitriptyline, fluoxetine, sertraline Certain medications for seizures like phenobarbital, primidone Certain medications for stomach problems General anesthetics like halothane, isoflurane, methoxyflurane, propofol Local anesthetics like lidocaine, pramoxine, tetracaine Medications that relax muscles for surgery Opioid medications for pain Phenothiazines like chlorpromazine, mesoridazine, prochlorperazine, thioridazine This list may not describe all possible interactions. Give your health care provider a list of all the medicines, herbs, non-prescription drugs, or dietary supplements you use. Also tell them if you smoke, drink alcohol, or use illegal drugs. Some items may interact with your medicine. What should I watch for while using this medication? Visit your care team for regular checks on your progress. You may want to keep a record at home of how you feel your condition is responding to treatment. You may want to share this information with your care team at each visit. You should contact your care team if your seizures get worse or if you have any new types of seizures. Do not stop taking this medication or any of your seizure medications unless instructed by your care team. Stopping your medication suddenly can increase your seizures or their severity. This medication may cause serious skin reactions. They can happen weeks to months after starting the medication. Contact your care team right away if you notice fevers or flu-like symptoms with a rash. The rash may be red or purple and then turn into blisters or peeling of the skin. Or, you might notice a red rash with swelling of the face, lips or lymph nodes in your neck or under your arms. Wear a medical identification bracelet or chain if you are taking this medication for seizures. Carry a card that lists all your medications. This medication  may affect your coordination, reaction time, or judgment. Do not drive or operate machinery until you know how this medication affects you. Sit up or stand slowly to reduce the risk of dizzy or fainting spells. Drinking alcohol with this medication can increase the risk of these side effects. Your mouth may get dry. Chewing sugarless gum or sucking hard candy, and drinking plenty of water may help. Watch for new or worsening thoughts of suicide or depression. This includes sudden changes in mood, behaviors, or thoughts. These changes can happen at any time but are more common in the beginning of treatment or after a change in dose. Call your care team right away if you experience these thoughts or worsening depression. If you become pregnant while using this medication, you may enroll in the [place] Pregnancy Registry by calling [phone]. This registry collects information about the safety of antiepileptic medication use during pregnancy. What side effects may I notice from receiving this medication? Side effects that you should report to your care team as soon as possible: Allergic reactions or angioedema--skin rash, itching, hives, swelling of the face, eyes, lips, tongue, arms, or legs, trouble swallowing or breathing Rash, fever, and swollen  lymph nodes Thoughts of suicide or self harm, worsening mood, feelings of depression Trouble breathing Unusual changes in mood or behavior in children after use such as difficulty concentrating, hostility, or restlessness Side effects that usually do not require medical attention (report to your care team if they continue or are bothersome): Dizziness Drowsiness Nausea Swelling of ankles, feet, or hands Vomiting This list may not describe all possible side effects. Call your doctor for medical advice about side effects. You may report side effects to FDA at 1-800-FDA-1088. Where should I keep my medication? Keep out of reach  of children and pets. Store at room temperature between 15 and 30 degrees C (59 and 86 degrees F). Get rid of any unused medication after the expiration date. This medication may cause accidental overdose and death if taken by other adults, children, or pets. To get rid of medications that are no longer needed or have expired: Take the medication to a medication take-back program. Check with your pharmacy or law enforcement to find a location. If you cannot return the medication, check the label or package insert to see if the medication should be thrown out in the garbage or flushed down the toilet. If you are not sure, ask your care team. If it is safe to put it in the trash, empty the medication out of the container. Mix the medication with cat litter, dirt, coffee grounds, or other unwanted substance. Seal the mixture in a bag or container. Put it in the trash. NOTE: This sheet is a summary. It may not cover all possible information. If you have questions about this medicine, talk to your doctor, pharmacist, or health care provider.  2023 Elsevier/Gold Standard (2020-09-25 00:00:00)

## 2021-09-07 DIAGNOSIS — E039 Hypothyroidism, unspecified: Secondary | ICD-10-CM | POA: Diagnosis not present

## 2021-09-07 DIAGNOSIS — R5383 Other fatigue: Secondary | ICD-10-CM | POA: Diagnosis not present

## 2021-10-04 DIAGNOSIS — E039 Hypothyroidism, unspecified: Secondary | ICD-10-CM | POA: Diagnosis not present

## 2021-10-04 DIAGNOSIS — M1990 Unspecified osteoarthritis, unspecified site: Secondary | ICD-10-CM | POA: Diagnosis not present

## 2021-10-04 DIAGNOSIS — I10 Essential (primary) hypertension: Secondary | ICD-10-CM | POA: Diagnosis not present

## 2021-10-04 DIAGNOSIS — Z7689 Persons encountering health services in other specified circumstances: Secondary | ICD-10-CM | POA: Diagnosis not present

## 2021-10-04 DIAGNOSIS — M797 Fibromyalgia: Secondary | ICD-10-CM | POA: Diagnosis not present

## 2021-10-12 DIAGNOSIS — D649 Anemia, unspecified: Secondary | ICD-10-CM | POA: Diagnosis not present

## 2021-10-12 DIAGNOSIS — G629 Polyneuropathy, unspecified: Secondary | ICD-10-CM | POA: Diagnosis not present

## 2021-10-12 DIAGNOSIS — I10 Essential (primary) hypertension: Secondary | ICD-10-CM | POA: Diagnosis not present

## 2021-10-12 DIAGNOSIS — M1990 Unspecified osteoarthritis, unspecified site: Secondary | ICD-10-CM | POA: Diagnosis not present

## 2021-10-18 DIAGNOSIS — R739 Hyperglycemia, unspecified: Secondary | ICD-10-CM | POA: Diagnosis not present

## 2021-10-18 DIAGNOSIS — E559 Vitamin D deficiency, unspecified: Secondary | ICD-10-CM | POA: Diagnosis not present

## 2021-10-18 DIAGNOSIS — Z2821 Immunization not carried out because of patient refusal: Secondary | ICD-10-CM | POA: Diagnosis not present

## 2021-10-18 DIAGNOSIS — G629 Polyneuropathy, unspecified: Secondary | ICD-10-CM | POA: Diagnosis not present

## 2021-10-18 DIAGNOSIS — D649 Anemia, unspecified: Secondary | ICD-10-CM | POA: Diagnosis not present

## 2021-10-18 DIAGNOSIS — I10 Essential (primary) hypertension: Secondary | ICD-10-CM | POA: Diagnosis not present

## 2021-10-18 DIAGNOSIS — E782 Mixed hyperlipidemia: Secondary | ICD-10-CM | POA: Diagnosis not present

## 2021-11-12 DIAGNOSIS — I10 Essential (primary) hypertension: Secondary | ICD-10-CM | POA: Diagnosis not present

## 2021-11-12 DIAGNOSIS — G629 Polyneuropathy, unspecified: Secondary | ICD-10-CM | POA: Diagnosis not present

## 2021-11-12 DIAGNOSIS — R7303 Prediabetes: Secondary | ICD-10-CM | POA: Diagnosis not present

## 2021-11-12 DIAGNOSIS — K229 Disease of esophagus, unspecified: Secondary | ICD-10-CM | POA: Diagnosis not present

## 2021-11-12 DIAGNOSIS — D539 Nutritional anemia, unspecified: Secondary | ICD-10-CM | POA: Diagnosis not present

## 2021-11-19 DIAGNOSIS — R1310 Dysphagia, unspecified: Secondary | ICD-10-CM | POA: Diagnosis not present

## 2021-11-19 DIAGNOSIS — K219 Gastro-esophageal reflux disease without esophagitis: Secondary | ICD-10-CM | POA: Diagnosis not present

## 2021-11-19 DIAGNOSIS — Z1211 Encounter for screening for malignant neoplasm of colon: Secondary | ICD-10-CM | POA: Diagnosis not present

## 2021-11-22 ENCOUNTER — Telehealth: Payer: Self-pay | Admitting: Cardiology

## 2021-11-22 NOTE — Telephone Encounter (Signed)
Calling to f/u on Medical Records request that was sent via fax on 11/14/21. She states that they will refax again today. Please advise

## 2021-11-22 NOTE — Telephone Encounter (Signed)
Did you get a medical records request on this patient?

## 2021-11-22 NOTE — Telephone Encounter (Signed)
Kayla [name] from Dr. Ival Bible office called to request records for the last 2 years. Fax number: [phone].  Will forward to medical records

## 2021-11-23 NOTE — Telephone Encounter (Signed)
Error

## 2021-12-03 DIAGNOSIS — I10 Essential (primary) hypertension: Secondary | ICD-10-CM | POA: Diagnosis not present

## 2021-12-03 DIAGNOSIS — M79605 Pain in left leg: Secondary | ICD-10-CM | POA: Diagnosis not present

## 2021-12-03 DIAGNOSIS — M79604 Pain in right leg: Secondary | ICD-10-CM | POA: Diagnosis not present

## 2021-12-03 DIAGNOSIS — E538 Deficiency of other specified B group vitamins: Secondary | ICD-10-CM | POA: Diagnosis not present

## 2021-12-03 DIAGNOSIS — E559 Vitamin D deficiency, unspecified: Secondary | ICD-10-CM | POA: Diagnosis not present

## 2021-12-03 DIAGNOSIS — L304 Erythema intertrigo: Secondary | ICD-10-CM | POA: Diagnosis not present

## 2022-03-20 ENCOUNTER — Telehealth: Payer: Self-pay | Admitting: Family

## 2022-03-20 NOTE — Telephone Encounter (Signed)
Left message for patient to call back and schedule Medicare Annual Wellness Visit (AWV) either virtually or phone . Left  my Herbie Drape number [phone]   Last AWV 03/27/22 please schedule with Nurse Health Adviser   45 min for awv-i and in office appointments 30 min for awv-s  phone/virtual appointments

## 2022-05-14 ENCOUNTER — Other Ambulatory Visit: Payer: Self-pay

## 2022-05-14 ENCOUNTER — Emergency Department (HOSPITAL_COMMUNITY): Payer: Medicare PPO

## 2022-05-14 ENCOUNTER — Encounter (HOSPITAL_COMMUNITY): Payer: Self-pay | Admitting: Emergency Medicine

## 2022-05-14 ENCOUNTER — Emergency Department (HOSPITAL_COMMUNITY)
Admission: EM | Admit: 2022-05-14 | Discharge: 2022-05-14 | Disposition: A | Discharge status: Home / Self Care | Payer: Medicare PPO | Attending: Emergency Medicine | Admitting: Emergency Medicine

## 2022-05-14 DIAGNOSIS — Z79899 Other long term (current) drug therapy: Secondary | ICD-10-CM | POA: Diagnosis not present

## 2022-05-14 DIAGNOSIS — K5792 Diverticulitis of intestine, part unspecified, without perforation or abscess without bleeding: Secondary | ICD-10-CM | POA: Diagnosis not present

## 2022-05-14 DIAGNOSIS — N189 Chronic kidney disease, unspecified: Secondary | ICD-10-CM | POA: Diagnosis not present

## 2022-05-14 DIAGNOSIS — I129 Hypertensive chronic kidney disease with stage 1 through stage 4 chronic kidney disease, or unspecified chronic kidney disease: Secondary | ICD-10-CM | POA: Diagnosis not present

## 2022-05-14 DIAGNOSIS — R1031 Right lower quadrant pain: Secondary | ICD-10-CM | POA: Diagnosis present

## 2022-05-14 DIAGNOSIS — K5730 Diverticulosis of large intestine without perforation or abscess without bleeding: Secondary | ICD-10-CM | POA: Diagnosis not present

## 2022-05-14 DIAGNOSIS — E039 Hypothyroidism, unspecified: Secondary | ICD-10-CM | POA: Insufficient documentation

## 2022-05-14 DIAGNOSIS — E876 Hypokalemia: Secondary | ICD-10-CM | POA: Insufficient documentation

## 2022-05-14 DIAGNOSIS — N3000 Acute cystitis without hematuria: Secondary | ICD-10-CM

## 2022-05-14 DIAGNOSIS — K5732 Diverticulitis of large intestine without perforation or abscess without bleeding: Secondary | ICD-10-CM | POA: Diagnosis not present

## 2022-05-14 DIAGNOSIS — K429 Umbilical hernia without obstruction or gangrene: Secondary | ICD-10-CM | POA: Diagnosis not present

## 2022-05-14 DIAGNOSIS — K449 Diaphragmatic hernia without obstruction or gangrene: Secondary | ICD-10-CM | POA: Diagnosis not present

## 2022-05-14 LAB — COMPREHENSIVE METABOLIC PANEL
ALT: 12 U/L (ref 0–44)
AST: 21 U/L (ref 15–41)
Albumin: 3.1 g/dL — ABNORMAL LOW (ref 3.5–5.0)
Alkaline Phosphatase: 107 U/L (ref 38–126)
Anion gap: 13 (ref 5–15)
BUN: 8 mg/dL (ref 8–23)
CO2: 21 mmol/L — ABNORMAL LOW (ref 22–32)
Calcium: 9.3 mg/dL (ref 8.9–10.3)
Chloride: 107 mmol/L (ref 98–111)
Creatinine, Ser: 0.89 mg/dL (ref 0.44–1.00)
GFR, Estimated: >60 mL/min (ref >60)
Glucose, Bld: 126 mg/dL — ABNORMAL HIGH (ref 70–99)
Potassium: 3.4 mmol/L — ABNORMAL LOW (ref 3.5–5.1)
Sodium: 141 mmol/L (ref 135–145)
Total Bilirubin: 0.3 mg/dL (ref 0.3–1.2)
Total Protein: 6.9 g/dL (ref 6.5–8.1)

## 2022-05-14 LAB — URINALYSIS, ROUTINE W REFLEX MICROSCOPIC
Bilirubin Urine: NEGATIVE
Glucose, UA: NEGATIVE mg/dL
Ketones, ur: NEGATIVE mg/dL
Nitrite: NEGATIVE
Protein, ur: NEGATIVE mg/dL
Specific Gravity, Urine: >1.046 — ABNORMAL HIGH (ref 1.005–1.030)
WBC, UA: >50 WBC/hpf (ref 0–5)
pH: 6 (ref 5.0–8.0)

## 2022-05-14 LAB — CBC WITH DIFFERENTIAL/PLATELET
Abs Immature Granulocytes: 0.02 10*3/uL (ref 0.00–0.07)
Basophils Absolute: 0.1 10*3/uL (ref 0.0–0.1)
Basophils Relative: 1 %
Eosinophils Absolute: 0.3 10*3/uL (ref 0.0–0.5)
Eosinophils Relative: 3 %
HCT: 44.2 % (ref 36.0–46.0)
Hemoglobin: 14.5 g/dL (ref 12.0–15.0)
Immature Granulocytes: 0 %
Lymphocytes Relative: 42 %
Lymphs Abs: 3.7 10*3/uL (ref 0.7–4.0)
MCH: 30.6 pg (ref 26.0–34.0)
MCHC: 32.8 g/dL (ref 30.0–36.0)
MCV: 93.2 fL (ref 80.0–100.0)
Monocytes Absolute: 0.9 10*3/uL (ref 0.1–1.0)
Monocytes Relative: 11 %
Neutro Abs: 3.8 10*3/uL (ref 1.7–7.7)
Neutrophils Relative %: 43 %
Platelets: 592 10*3/uL — ABNORMAL HIGH (ref 150–400)
RBC: 4.74 MIL/uL (ref 3.87–5.11)
RDW: 12.6 % (ref 11.5–15.5)
WBC: 8.8 10*3/uL (ref 4.0–10.5)
nRBC: 0 % (ref 0.0–0.2)

## 2022-05-14 LAB — LIPASE, BLOOD: Lipase: 42 U/L (ref 11–51)

## 2022-05-14 MED ORDER — IOHEXOL 350 MG/ML SOLN
75.0000 mL | Freq: Once | INTRAVENOUS | Status: AC | PRN
  Administered 2022-05-14: 75 mL via INTRAVENOUS

## 2022-05-14 MED ORDER — METRONIDAZOLE 500 MG PO TABS: 500.0000 mg | ORAL_TABLET | Freq: Two times a day (BID) | ORAL | 0 refills | Status: AC

## 2022-05-14 MED ORDER — SODIUM CHLORIDE 0.9 % IV BOLUS
500.0000 mL | Freq: Once | INTRAVENOUS | Status: AC
  Administered 2022-05-14: 500 mL via INTRAVENOUS

## 2022-05-14 MED ORDER — CIPROFLOXACIN HCL 500 MG PO TABS: 500.0000 mg | ORAL_TABLET | Freq: Two times a day (BID) | ORAL | 0 refills | Status: AC

## 2022-05-14 MED ORDER — METRONIDAZOLE 500 MG PO TABS
500.0000 mg | ORAL_TABLET | Freq: Once | ORAL | Status: AC
Start: 2022-05-14 — End: 2022-05-14
  Administered 2022-05-14: 500 mg via ORAL
  Filled 2022-05-14: qty 1

## 2022-05-14 MED ORDER — PANTOPRAZOLE SODIUM 20 MG PO TBEC: 20.0000 mg | DELAYED_RELEASE_TABLET | Freq: Every day | ORAL | 0 refills | Status: DC

## 2022-05-14 MED ORDER — CIPROFLOXACIN HCL 500 MG PO TABS
500.0000 mg | ORAL_TABLET | Freq: Once | ORAL | Status: AC
  Administered 2022-05-14: 500 mg via ORAL
  Filled 2022-05-14: qty 1

## 2022-05-14 NOTE — Discharge Instructions (Addendum)
Take the antibiotics as prescribed.  Follow-up with a primary care doctor to make sure your symptoms resolved.  Return to the ED for fevers, vomiting, worsening symptoms  The antacid medication is to help with symptoms possibly related to your hiatal hernia

## 2022-05-14 NOTE — ED Notes (Signed)
Explained to pt that provider would like a urine sample, pt states that she doesn't have the urge now, but will try soon

## 2022-05-14 NOTE — ED Triage Notes (Signed)
PT complains of RLQ pain that has been on and off for over a year worse in past 2 weeks. Pt states decreased appetite and 15 lb weigh loss over past 2 weeks. Decreased Bms but when she does have BM comes out like water. PT also complains that urinary urgency with minimal stream of urine for 2 days.

## 2022-05-14 NOTE — ED Provider Notes (Signed)
Kayla [name] Provider Note   CSN: [id number] Arrival date & time: 05/14/22  1217     History  Chief Complaint  Patient presents with   Abdominal Pain    Kayla [name] is a 77 y.o. female.   Abdominal Pain    Patient has a history of asthma, GERD, hypertension, hypothyroidism, irritable bowel syndrome, fibromyalgia, chronic kidney disease and urosepsis.  Patient states she has been having trouble with abdominal pain over the last couple of weeks.  Patient states it has been moving somewhat and location.  It has been sharp and intense in the right lower quadrant but it also has been in her left upper quadrant and epigastric region.  Patient has had some nausea but no vomiting.  She also noted some difficulty swallowing.  She has had weight loss and also has noted decreased urine output as well as some urinary frequency and urgency.  Patient is concerned about appendicitis still as well as recurrent urinary tract infection and kidney infection/sepsis.  No recent fevers.  Patient has not seen anyone prior to today because she has been dealing with the health of other family members.  Home Medications Prior to Admission medications   Medication Sig Start Date End Date Taking? Authorizing Provider  ciprofloxacin (CIPRO) 500 MG tablet Take 1 tablet (500 mg total) by mouth 2 (two) times daily for 10 days. 05/14/22 05/24/22 Yes Dorie Rank, MD  metroNIDAZOLE (FLAGYL) 500 MG tablet Take 1 tablet (500 mg total) by mouth 2 (two) times daily for 10 days. 05/14/22 05/24/22 Yes Dorie Rank, MD  pantoprazole (PROTONIX) 20 MG tablet Take 1 tablet (20 mg total) by mouth daily. 05/14/22  Yes Dorie Rank, MD  acetaminophen (TYLENOL) 500 MG tablet Take 1,000 mg by mouth in the morning, at noon, in the evening, and at bedtime.    [provider]  amLODipine (NORVASC) 5 MG tablet Take 1 tablet (5 mg total) by mouth daily. 05/07/21   Buford Dresser, MD  Ascorbic  Acid (VITAMIN C) 1000 MG tablet Take 500 mg by mouth daily. Patient not taking: Reported on 09/06/2021    [provider]  Calcium-Magnesium-Zinc (CAL-MAG-ZINC PO) Take 1 tablet by mouth 2 (two) times daily.    [provider]  Cholecalciferol (VITAMIN D3) 25 MCG (1000 UT) CAPS Take 1,000 Units by mouth daily.    [provider]  ferrous sulfate 325 (65 FE) MG tablet Take 325 mg by mouth 2 (two) times daily with a meal.    [provider]  fluticasone (FLONASE) 50 MCG/ACT nasal spray Place 2 sprays into both nostrils daily. Patient not taking: Reported on 09/06/2021 10/20/20   Jaynee Eagles, PA-C  gabapentin (NEURONTIN) 100 MG capsule Take 1 capsule (100 mg total) by mouth 3 (three) times daily. 09/06/21   Melvenia Beam, MD  levothyroxine (SYNTHROID) 137 MCG tablet Take 1 tablet (137 mcg total) by mouth daily before breakfast. 08/15/21   Camillia Herter, NP  nystatin (MYCOSTATIN/NYSTOP) powder Apply 1 application topically 3 (three) times daily. Patient not taking: Reported on 09/06/2021 07/27/20   Wieters, Madelynn Done C, PA-C  nystatin-triamcinolone (MYCOLOG II) cream Apply to affected area twice daily Patient not taking: Reported on 09/06/2021 07/27/20   Wieters, Hallie C, PA-C  polymixin-bacitracin (POLYSPORIN) 500-10000 UNIT/GM OINT ointment Apply 1 application topically 2 (two) times daily. Patient not taking: Reported on 09/06/2021 07/12/20   Chase Picket, MD  pyridOXINE (VITAMIN B-6) 100 MG tablet Take 100  mg by mouth daily.    [provider]  triamcinolone cream (KENALOG) 0.1 % Apply 1 application topically 2 (two) times daily. Patient not taking: Reported on 09/06/2021 09/12/20   Nicolette Bang, MD  vitamin B-12 (CYANOCOBALAMIN) 500 MCG tablet Take 1 tablet (500 mcg total) by mouth daily. 04/04/21   Dorna Mai, MD      Allergies    Ace inhibitors and Lisinopril    Review of Systems   Review of Systems  Gastrointestinal:  Positive for  abdominal pain.    Physical Exam Updated Vital Signs BP (!) 140/92   Pulse 92   Temp 98.3 F (36.8 C) (Oral)   Resp 16   Ht 1.626 m ('5\' 4"'$ )   Wt 90.7 kg   SpO2 99%   BMI 34.33 kg/m  Physical Exam Vitals and nursing note reviewed.  Constitutional:      General: She is not in acute distress.    Appearance: She is well-developed.  HENT:     Head: Normocephalic and atraumatic.     Right Ear: External ear normal.     Left Ear: External ear normal.  Eyes:     General: No scleral icterus.       Right eye: No discharge.        Left eye: No discharge.     Conjunctiva/sclera: Conjunctivae normal.  Neck:     Trachea: No tracheal deviation.  Cardiovascular:     Rate and Rhythm: Normal rate and regular rhythm.  Pulmonary:     Effort: Pulmonary effort is normal. No respiratory distress.     Breath sounds: Normal breath sounds. No stridor. No wheezing or rales.  Abdominal:     General: Bowel sounds are normal. There is no distension.     Palpations: Abdomen is soft.     Tenderness: There is abdominal tenderness in the right lower quadrant. There is no guarding or rebound.  Musculoskeletal:        General: No tenderness or deformity.     Cervical back: Neck supple.  Skin:    General: Skin is warm and dry.     Findings: No rash.  Neurological:     General: No focal deficit present.     Mental Status: She is alert.     Cranial Nerves: No cranial nerve deficit, dysarthria or facial asymmetry.     Sensory: No sensory deficit.     Motor: No abnormal muscle tone or seizure activity.     Coordination: Coordination normal.  Psychiatric:        Mood and Affect: Mood normal.     ED Results / Procedures / Treatments   Labs (all labs ordered are listed, but only abnormal results are displayed) Labs Reviewed  CBC WITH DIFFERENTIAL/PLATELET - Abnormal; Notable for the following components:      Result Value   Platelets 592 (*)    All other components within normal limits   COMPREHENSIVE METABOLIC PANEL - Abnormal; Notable for the following components:   Potassium 3.4 (*)    CO2 21 (*)    Glucose, Bld 126 (*)    Albumin 3.1 (*)    All other components within normal limits  URINALYSIS, ROUTINE W REFLEX MICROSCOPIC - Abnormal; Notable for the following components:   Specific Gravity, Urine >1.046 (*)    Hgb urine dipstick SMALL (*)    Leukocytes,Ua MODERATE (*)    Bacteria, UA RARE (*)    All other components within normal limits  URINE  CULTURE  LIPASE, BLOOD    EKG None  Radiology CT ABDOMEN PELVIS W CONTRAST  Result Date: 05/14/2022 CLINICAL DATA:  Several week history of diffuse migratory abdominal pain associated with nausea EXAM: CT ABDOMEN AND PELVIS WITH CONTRAST TECHNIQUE: Multidetector CT imaging of the abdomen and pelvis was performed using the standard protocol following bolus administration of intravenous contrast. RADIATION DOSE REDUCTION: This exam was performed according to the departmental dose-optimization program which includes automated exposure control, adjustment of the mA and/or kV according to patient size and/or use of iterative reconstruction technique. CONTRAST:  50m OMNIPAQUE IOHEXOL 350 MG/ML SOLN COMPARISON:  CT abdomen and pelvis dated 10/11/2018, ultrasound pelvis dated 01/13/2019 FINDINGS: Lower chest: No focal consolidation or pulmonary nodule in the lung bases. No pleural effusion or pneumothorax demonstrated. Partially imaged heart size is normal. Coronary artery calcifications. Hepatobiliary: No focal hepatic lesions. No intra or extrahepatic biliary ductal dilation. Cholecystectomy. Pancreas: No focal lesions or main ductal dilation. Spleen: Normal in size without focal abnormality. Adrenals/Urinary Tract: No adrenal nodules. No suspicious renal mass, calculi or hydronephrosis. Underdistended urinary bladder with mild surrounding stranding. Stomach/Bowel: Small hiatal hernia. Intraluminal hyperattenuation throughout the colon  likely corresponds to previously ingested material, particularly if there has been prior ingestion of enteric contrast material. No abnormal bowel dilation. Within the right lower quadrant, there is a diverticular outpouching arising from a loop of small bowel with surrounding inflammatory stranding (3:47). A small bowel diverticulum is seen in this location on prior CT abdomen and pelvis. Colonic diverticulosis without acute diverticulitis. Normal appendix. Vascular/Lymphatic: Aortic atherosclerosis. No enlarged abdominal or pelvic lymph nodes. Reproductive: Left adnexal simple appearing cyst measures 4.8 x 4.3 cm, not substantially changed from 10/11/2018, and previously benign-appearing on pelvic ultrasound examination. No new adnexal masses. Other: No free fluid, fluid collection, or free air. Musculoskeletal: Unchanged grade 1 anterolisthesis at L4-5. Small fat-containing paraumbilical hernia. IMPRESSION: 1. Findings suggestive of small bowel diverticulitis in the right lower quadrant. No evidence of perforation. 2. Underdistended urinary bladder with mild surrounding stranding. Correlate with urinalysis. 3. Colonic diverticulosis without acute diverticulitis. 4. Small hiatal hernia. 5. Left adnexal simple appearing cyst measures 4.8 x 4.3 cm, not substantially changed from 10/11/2018, and previously benign-appearing on pelvic ultrasound examination. No specific follow-up imaging recommended. 6. Aortic Atherosclerosis (ICD10-I70.0). Coronary artery calcifications. Assessment for potential risk factor modification, dietary therapy or pharmacologic therapy may be warranted, if clinically indicated. Electronically Signed   By: LDarrin NipperM.D.   On: 05/14/2022 16:17    Procedures Procedures    Medications Ordered in ED Medications  sodium chloride 0.9 % bolus 500 mL (0 mLs Intravenous Stopped 05/14/22 1706)  iohexol (OMNIPAQUE) 350 MG/ML injection 75 mL (75 mLs Intravenous Contrast Given 05/14/22 1558)   ciprofloxacin (CIPRO) tablet 500 mg (500 mg Oral Given 05/14/22 1802)  metroNIDAZOLE (FLAGYL) tablet 500 mg (500 mg Oral Given 05/14/22 1802)    ED Course/ Medical Decision Making/ A&P Clinical Course as of 05/14/22 1831  Tue May 14, 2022  1540 Comprehensive metabolic panel(!) Metabolic panel without significant abnormalities.  Potassium slightly decreased.  CBC normal.  Lipase normal [JK]  1717 CT scan shows evidence of small bowel diverticulitis without perforation.  Mild stranding noted around the bladder.  Small hiatal hernia and stable adnexal cyst noted [JK]  1818 Urinalysis does show moderate leukocytes, rare bacteria greater than 50 white blood cells.  Will start on Cipro Flagyl to cover UTI as well as diverticulitis.  Will send off urine culture [JK]  Clinical Course User Index [JK] Dorie Rank, MD                             Medical Decision Making Problems Addressed: Diverticulitis: acute illness or injury that poses a threat to life or bodily functions Hiatal hernia: undiagnosed new problem with uncertain prognosis  Amount and/or Complexity of Data Reviewed Labs: ordered. Decision-making details documented in ED Course. Radiology: ordered and independent interpretation performed.  Risk Prescription drug management.   Patient presented to the ED for evaluation of abdominal pain.  Patient noted to have tenderness in the right lower abdomen.  Concerned about the possibility of colitis appendicitis diverticulitis.  Laboratory tests were reassuring.  No signs of anemia significant leukocytosis.  No signs of acute renal failure.  CT scan shows findings consistent with diverticulitis.  No evidence of abscess or perforation.  Patient has a stable adnexal mass.  She also has hiatal hernia which could be causing some of her symptoms as well.  Urinalysis also suggestive of UTI and patient has had some urinary symptoms.  Will discharge home with prescription for antacids antibiotic.   Outpatient follow-up with GI        Final Clinical Impression(s) / ED Diagnoses Final diagnoses:  Diverticulitis  Hiatal hernia  Acute cystitis without hematuria    Rx / DC Orders ED Discharge Orders          Ordered    ciprofloxacin (CIPRO) 500 MG tablet  2 times daily        05/14/22 1725    metroNIDAZOLE (FLAGYL) 500 MG tablet  2 times daily        05/14/22 1725    pantoprazole (PROTONIX) 20 MG tablet  Daily        05/14/22 1727              Dorie Rank, MD 05/14/22 [phone]

## 2022-05-14 NOTE — ED Provider Triage Note (Cosign Needed Addendum)
Emergency Medicine Provider Triage Evaluation Note  Kayla [name] , a 77 y.o. female  was evaluated in triage.  Pt complains of diffuse migratory abdominal pain for the past few weeks with some nausea, no vomiting. Reports some diarrhea as well. Questions some dysuria. No melena or hematochezia. She reports she is concerned she has sepsis. Thinks she has had a 15 pound weight loss, but doesn't know how much seh weighed to begin with.   Review of Systems  Positive:  Negative:   Physical Exam  BP (!) 143/95 (BP Location: Left Arm)   Pulse (!) 102   Temp 98.2 F (36.8 C) (Oral)   Resp 17   Ht '5\' 4"'$  (1.626 m)   Wt 90.7 kg   SpO2 98%   BMI 34.33 kg/m  Gen:   Awake, no distress   Resp:  Normal effort  MSK:   Moves extremities without difficulty  Other:  Diffuse abdominal tenderness without guarding or rebound. Soft.   Medical Decision Making  Medically screening exam initiated at 1:07 PM.  Appropriate orders placed.  [name] was informed that the remainder of the evaluation will be completed by another provider, this initial triage assessment does not replace that evaluation, and the importance of remaining in the ED until their evaluation is complete.  Labs and imaging ordered.    Sherrell Puller, PA-C 05/14/22 1309    Sherrell Puller, [place] 05/14/22 1310

## 2022-05-14 NOTE — ED Notes (Signed)
Patient transported to CT 

## 2022-05-16 LAB — URINE CULTURE: Culture: >=100000 — AB

## 2022-05-17 ENCOUNTER — Telehealth (HOSPITAL_BASED_OUTPATIENT_CLINIC_OR_DEPARTMENT_OTHER): Payer: Self-pay | Admitting: *Deleted

## 2022-05-17 NOTE — Telephone Encounter (Signed)
Post ED Visit - Positive Culture Follow-up  Culture report reviewed by antimicrobial stewardship pharmacist: [place] Team []$  Elenor Quinones, Pharm.D. []$  Heide Guile, Pharm.D., BCPS AQ-ID []$  Parks Neptune, Pharm.D., BCPS []$  Alycia Rossetti, Pharm.D., BCPS []$  Justice, [place].D., BCPS, AAHIVP []$  Legrand Como, Pharm.D., BCPS, AAHIVP []$  Salome Arnt, PharmD, BCPS []$  Johnnette Gourd, PharmD, BCPS []$  Hughes Better, PharmD, BCPS []$  Leeroy Cha, PharmD []$  Laqueta Linden, PharmD, BCPS []$  Albertina Parr, PharmD  [place] Team []$  Leodis Sias, PharmD []$  Lindell Spar, PharmD []$  Royetta Asal, PharmD []$  Graylin Shiver, Rph []$  Rema Fendt) Glennon Mac, PharmD []$  Arlyn Dunning, PharmD []$  Netta Cedars, PharmD []$  Dia Sitter, PharmD []$  Leone Haven, PharmD []$  Gretta Arab, PharmD []$  Theodis Shove, PharmD []$  Peggyann Juba, PharmD []$  Reuel Boom, PharmD   Positive urine culture Treated with Ciprofloxacin, organism sensitive to the same and no further patient follow-up is required at this time.  Arturo Morton, PharmD  Harlon Flor Talley 05/17/2022, 7:43 AM

## 2022-05-27 ENCOUNTER — Telehealth: Payer: Self-pay

## 2022-05-27 NOTE — Telephone Encounter (Signed)
        Patient  visited [facility] on 2/6   Telephone encounter attempt : 1st   A HIPAA compliant voice message was left requesting a return call.  Instructed patient to call back     [facility] [phone] [address], [place], [place] [address] Phone: [phone] Email: [email or web address]

## 2022-05-28 ENCOUNTER — Telehealth: Payer: Self-pay

## 2022-05-28 NOTE — Telephone Encounter (Signed)
        Patient  visited [facility] on 2/6   Telephone encounter attempt :  2nd  A HIPAA compliant voice message was left requesting a return call.  Instructed patient to call back.    [facility] [phone] [address], [place], [place] [address] Phone: [phone] Email: [email or web address]

## 2022-06-12 ENCOUNTER — Telehealth: Payer: Self-pay

## 2022-06-12 NOTE — Telephone Encounter (Signed)
Att to contact pt to schedule AWV by telephone for 06/13/22-06/14/22 no ans lvm to call office. If pt returns call, please schedule appt for pt at their preference for AWV visit on one of the two days above.

## 2022-06-17 DIAGNOSIS — N3000 Acute cystitis without hematuria: Secondary | ICD-10-CM | POA: Diagnosis not present

## 2022-06-17 DIAGNOSIS — E039 Hypothyroidism, unspecified: Secondary | ICD-10-CM | POA: Diagnosis not present

## 2022-06-17 DIAGNOSIS — B372 Candidiasis of skin and nail: Secondary | ICD-10-CM | POA: Diagnosis not present

## 2022-06-18 ENCOUNTER — Encounter (HOSPITAL_BASED_OUTPATIENT_CLINIC_OR_DEPARTMENT_OTHER): Payer: Self-pay | Admitting: Cardiology

## 2022-06-21 DIAGNOSIS — N3000 Acute cystitis without hematuria: Secondary | ICD-10-CM | POA: Diagnosis not present

## 2022-06-21 DIAGNOSIS — M1990 Unspecified osteoarthritis, unspecified site: Secondary | ICD-10-CM | POA: Diagnosis not present

## 2022-06-21 DIAGNOSIS — I10 Essential (primary) hypertension: Secondary | ICD-10-CM | POA: Diagnosis not present

## 2022-06-21 DIAGNOSIS — B372 Candidiasis of skin and nail: Secondary | ICD-10-CM | POA: Diagnosis not present

## 2022-06-21 DIAGNOSIS — E039 Hypothyroidism, unspecified: Secondary | ICD-10-CM | POA: Diagnosis not present

## 2022-07-03 DIAGNOSIS — N3000 Acute cystitis without hematuria: Secondary | ICD-10-CM | POA: Diagnosis not present

## 2022-07-03 DIAGNOSIS — N390 Urinary tract infection, site not specified: Secondary | ICD-10-CM | POA: Diagnosis not present

## 2022-07-03 DIAGNOSIS — E559 Vitamin D deficiency, unspecified: Secondary | ICD-10-CM | POA: Diagnosis not present

## 2022-07-22 DIAGNOSIS — E559 Vitamin D deficiency, unspecified: Secondary | ICD-10-CM | POA: Diagnosis not present

## 2022-07-22 DIAGNOSIS — N390 Urinary tract infection, site not specified: Secondary | ICD-10-CM | POA: Diagnosis not present

## 2022-07-22 DIAGNOSIS — D519 Vitamin B12 deficiency anemia, unspecified: Secondary | ICD-10-CM | POA: Diagnosis not present

## 2022-07-22 DIAGNOSIS — R300 Dysuria: Secondary | ICD-10-CM | POA: Diagnosis not present

## 2022-07-25 DIAGNOSIS — M79642 Pain in left hand: Secondary | ICD-10-CM | POA: Diagnosis not present

## 2022-07-25 DIAGNOSIS — M79641 Pain in right hand: Secondary | ICD-10-CM | POA: Diagnosis not present

## 2022-07-25 DIAGNOSIS — M79672 Pain in left foot: Secondary | ICD-10-CM | POA: Diagnosis not present

## 2022-07-25 DIAGNOSIS — M25561 Pain in right knee: Secondary | ICD-10-CM | POA: Diagnosis not present

## 2022-07-25 DIAGNOSIS — L4050 Arthropathic psoriasis, unspecified: Secondary | ICD-10-CM | POA: Diagnosis not present

## 2022-07-25 DIAGNOSIS — M549 Dorsalgia, unspecified: Secondary | ICD-10-CM | POA: Diagnosis not present

## 2022-07-25 DIAGNOSIS — R21 Rash and other nonspecific skin eruption: Secondary | ICD-10-CM | POA: Diagnosis not present

## 2022-07-25 DIAGNOSIS — R768 Other specified abnormal immunological findings in serum: Secondary | ICD-10-CM | POA: Diagnosis not present

## 2022-07-25 DIAGNOSIS — M797 Fibromyalgia: Secondary | ICD-10-CM | POA: Diagnosis not present

## 2022-07-25 DIAGNOSIS — M79671 Pain in right foot: Secondary | ICD-10-CM | POA: Diagnosis not present

## 2022-07-25 DIAGNOSIS — M2550 Pain in unspecified joint: Secondary | ICD-10-CM | POA: Diagnosis not present

## 2022-07-25 DIAGNOSIS — M1990 Unspecified osteoarthritis, unspecified site: Secondary | ICD-10-CM | POA: Diagnosis not present

## 2022-08-06 DIAGNOSIS — Z13220 Encounter for screening for lipoid disorders: Secondary | ICD-10-CM | POA: Diagnosis not present

## 2022-08-06 DIAGNOSIS — Z0000 Encounter for general adult medical examination without abnormal findings: Secondary | ICD-10-CM | POA: Diagnosis not present

## 2022-08-06 DIAGNOSIS — Z114 Encounter for screening for human immunodeficiency virus [HIV]: Secondary | ICD-10-CM | POA: Diagnosis not present

## 2022-08-09 DIAGNOSIS — E785 Hyperlipidemia, unspecified: Secondary | ICD-10-CM | POA: Diagnosis not present

## 2022-08-09 DIAGNOSIS — Z0000 Encounter for general adult medical examination without abnormal findings: Secondary | ICD-10-CM | POA: Diagnosis not present

## 2022-08-09 DIAGNOSIS — E039 Hypothyroidism, unspecified: Secondary | ICD-10-CM | POA: Diagnosis not present

## 2022-08-30 DIAGNOSIS — E039 Hypothyroidism, unspecified: Secondary | ICD-10-CM | POA: Diagnosis not present

## 2022-08-30 DIAGNOSIS — R7303 Prediabetes: Secondary | ICD-10-CM | POA: Diagnosis not present

## 2022-08-30 DIAGNOSIS — I10 Essential (primary) hypertension: Secondary | ICD-10-CM | POA: Diagnosis not present

## 2022-09-04 ENCOUNTER — Encounter (HOSPITAL_BASED_OUTPATIENT_CLINIC_OR_DEPARTMENT_OTHER): Payer: Self-pay

## 2022-09-04 ENCOUNTER — Ambulatory Visit (HOSPITAL_BASED_OUTPATIENT_CLINIC_OR_DEPARTMENT_OTHER): Payer: Medicare PPO | Admitting: Cardiology

## 2022-09-04 DIAGNOSIS — E559 Vitamin D deficiency, unspecified: Secondary | ICD-10-CM | POA: Diagnosis not present

## 2022-09-04 DIAGNOSIS — R7989 Other specified abnormal findings of blood chemistry: Secondary | ICD-10-CM | POA: Diagnosis not present

## 2022-09-04 DIAGNOSIS — R7301 Impaired fasting glucose: Secondary | ICD-10-CM | POA: Diagnosis not present

## 2022-09-09 DIAGNOSIS — M797 Fibromyalgia: Secondary | ICD-10-CM | POA: Diagnosis not present

## 2022-09-09 DIAGNOSIS — M1990 Unspecified osteoarthritis, unspecified site: Secondary | ICD-10-CM | POA: Diagnosis not present

## 2022-09-09 DIAGNOSIS — R21 Rash and other nonspecific skin eruption: Secondary | ICD-10-CM | POA: Diagnosis not present

## 2022-09-09 DIAGNOSIS — L4050 Arthropathic psoriasis, unspecified: Secondary | ICD-10-CM | POA: Diagnosis not present

## 2022-09-09 DIAGNOSIS — R768 Other specified abnormal immunological findings in serum: Secondary | ICD-10-CM | POA: Diagnosis not present

## 2022-10-04 DIAGNOSIS — R7989 Other specified abnormal findings of blood chemistry: Secondary | ICD-10-CM | POA: Diagnosis not present

## 2022-10-04 DIAGNOSIS — I10 Essential (primary) hypertension: Secondary | ICD-10-CM | POA: Diagnosis not present

## 2022-10-04 DIAGNOSIS — E559 Vitamin D deficiency, unspecified: Secondary | ICD-10-CM | POA: Diagnosis not present

## 2022-10-04 DIAGNOSIS — R7303 Prediabetes: Secondary | ICD-10-CM | POA: Diagnosis not present

## 2022-10-22 ENCOUNTER — Encounter (HOSPITAL_BASED_OUTPATIENT_CLINIC_OR_DEPARTMENT_OTHER): Payer: Self-pay | Admitting: Family

## 2022-10-22 ENCOUNTER — Ambulatory Visit (HOSPITAL_BASED_OUTPATIENT_CLINIC_OR_DEPARTMENT_OTHER): Payer: Medicare PPO | Admitting: Family

## 2022-10-22 VITALS — BP 134/74 | HR 80 | Ht 64.0 in | Wt 201.1 lb

## 2022-10-22 DIAGNOSIS — E785 Hyperlipidemia, unspecified: Secondary | ICD-10-CM

## 2022-10-22 DIAGNOSIS — I10 Essential (primary) hypertension: Secondary | ICD-10-CM | POA: Diagnosis not present

## 2022-10-22 DIAGNOSIS — I2510 Atherosclerotic heart disease of native coronary artery without angina pectoris: Secondary | ICD-10-CM

## 2022-10-22 DIAGNOSIS — R600 Localized edema: Secondary | ICD-10-CM | POA: Diagnosis not present

## 2022-10-22 DIAGNOSIS — Z8249 Family history of ischemic heart disease and other diseases of the circulatory system: Secondary | ICD-10-CM | POA: Diagnosis not present
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
## 2022-10-22 NOTE — Patient Instructions (Addendum)
Medication Instructions:   If your cholesterol numbers are high when we get your numbers back we may consider a medication called Ezetimibe (Zetia). This is a non-statin medication that helps to lower cholesterol.   *If you need a refill on your cardiac medications before your next appointment, please call your pharmacy*  Labs: Your physician recommends that you return for lab work today: CMP, magnesium, direct LDL  Testing/Procedures: Your EKG today shows normal sinus rhythm which was a great result!  Your prior CT showed coronary calcification. If you begin experiencing chest pain when you are walking, please let us know and we will consider additional testing.   For coronary artery disease often called "heart disease" we aim for optimal guideline directed medical therapy. We use the "A, B, C"s to help keep [place] on track!  A = Aspirin 81mg  daily B = Blood pressure control. C = Cholesterol control. We want your LDL (Bad cholesterol) to be less than 70.  D = Diet - recommend following a low sodium diet E = Exercise - recommend gradually increasing to 150 minutes of exercise per week. This may be limited by your arthritis which is okay.   Follow-Up: At [facility], you and your health needs are our priority.  As part of our continuing mission to provide you with exceptional heart care, we have created designated Provider Care Teams.  These Care Teams include your primary Cardiologist (physician) and Advanced Practice Providers (APPs -  Physician Assistants and Nurse Practitioners) who all work together to provide you with the care you need, when you need it.  We recommend signing up for the patient portal called "MyChart".  Sign up information is provided on this After Visit Summary.  MyChart is used to connect with patients for Virtual Visits (Telemedicine).  Patients are able to view lab/test results, encounter notes, upcoming appointments, etc.  Non-urgent messages can be sent to  your provider as well.   To learn more about what you can do with MyChart, go to [email or web address].    Your next appointment:   3-4 month(s)  Provider:   Jodelle Red, MD    Other Instructions  Heart Healthy Diet Recommendations: A low-salt diet is recommended. Meats should be grilled, baked, or boiled. Avoid fried foods. Focus on lean protein sources like fish or chicken with vegetables and fruits. The American Heart Association is a Chief Technology Officer!  American Heart Association Diet and Lifeystyle Recommendations   Exercise recommendations: The American Heart Association recommends 150 minutes of moderate intensity exercise weekly. Try 30 minutes of moderate intensity exercise 4-5 times per week. This could include walking, jogging, or swimming.  To prevent or reduce lower extremity swelling: Eat a low salt diet. Salt makes the body hold onto extra fluid which causes swelling. Sit with legs elevated. For example, in the recliner or on an ottoman.  Wear knee-high compression stockings during the daytime. Ones labeled 15-20 mmHg provide good compression.

## 2022-10-23 LAB — COMPREHENSIVE METABOLIC PANEL
ALT: 6 IU/L (ref 0–32)
AST: 11 IU/L (ref 0–40)
Albumin: 4.2 g/dL (ref 3.8–4.8)
Alkaline Phosphatase: 127 IU/L — ABNORMAL HIGH (ref 44–121)
BUN/Creatinine Ratio: 15 (ref 12–28)
BUN: 11 mg/dL (ref 8–27)
Bilirubin Total: 0.3 mg/dL (ref 0.0–1.2)
CO2: 22 mmol/L (ref 20–29)
Calcium: 9.9 mg/dL (ref 8.7–10.3)
Chloride: 107 mmol/L — ABNORMAL HIGH (ref 96–106)
Creatinine, Ser: 0.71 mg/dL (ref 0.57–1.00)
Globulin, Total: 2.5 g/dL (ref 1.5–4.5)
Glucose: 108 mg/dL — ABNORMAL HIGH (ref 70–99)
Potassium: 3.9 mmol/L (ref 3.5–5.2)
Sodium: 144 mmol/L (ref 134–144)
Total Protein: 6.7 g/dL (ref 6.0–8.5)
eGFR: 88 mL/min/{1.73_m2} (ref >59)

## 2022-10-23 LAB — MAGNESIUM: Magnesium: 2.2 mg/dL (ref 1.6–2.3)

## 2022-10-23 LAB — LDL CHOLESTEROL, DIRECT: LDL Direct: 144 mg/dL — ABNORMAL HIGH (ref 0–99)

## 2022-10-24 ENCOUNTER — Telehealth (HOSPITAL_BASED_OUTPATIENT_CLINIC_OR_DEPARTMENT_OTHER): Payer: Self-pay

## 2022-10-24 MED ORDER — BLOOD PRESSURE KIT: 1.0000 | PACK | Freq: Every day | 0 refills | Status: DC

## 2022-10-24 NOTE — Telephone Encounter (Signed)
-----   Message from Kayla [name] sent at 10/23/2022  5:08 PM EDT ----- Hi! I forgot - can we lok at the BP flow chart to see how to get her one through her insurance?

## 2022-10-24 NOTE — Telephone Encounter (Signed)
Blood pressure kit ordered and faxed to [facility]. Will set reminder to call in one week to check on status.

## 2022-10-31 ENCOUNTER — Telehealth (HOSPITAL_BASED_OUTPATIENT_CLINIC_OR_DEPARTMENT_OTHER): Payer: Self-pay

## 2022-10-31 MED ORDER — BLOOD PRESSURE KIT: 1.0000 | PACK | Freq: Every day | 0 refills | Status: DC

## 2022-10-31 NOTE — Telephone Encounter (Signed)
Called patient, no answer, left detailed message (ok per DPR).

## 2022-10-31 NOTE — Telephone Encounter (Signed)
She can call the number on the back of her insurance card to see if her plan has "over the counter benefit" to purchase BP cuff. Otherwise, she will have to pick up a BP cuff. They are around $35 for an upper arm cuff at [facility] or other retailers.   Alver Sorrow, NP

## 2022-10-31 NOTE — Telephone Encounter (Addendum)
Called pharmacy to verify that patient had been shipped blood pressure kit. Order is pending, kit cannot be shipped to patient because per the pharmacy at this time only medicaid is paying for blood pressure cuffs. Even patients with medicare/medicaid do not qualify. They can only have medicaid.   ----- Message from Nurse Lendon Collar sent at 10/24/2022  7:46 AM EDT ----- Call [facility] to check on BP cuff status

## 2022-10-31 NOTE — Telephone Encounter (Signed)
Left message for patient to call back  

## 2022-10-31 NOTE — Telephone Encounter (Signed)
Patient returned RN's call and stated she has been having some issues with her phone.  The best number to reach her is [phone].

## 2022-11-01 NOTE — Telephone Encounter (Signed)
2nd call attempt, no answer, left message to call back!  

## 2022-11-01 NOTE — Addendum Note (Signed)
Addended by: Marlene Lard on: 11/01/2022 01:38 PM   Modules accepted: Orders

## 2022-11-01 NOTE — Telephone Encounter (Signed)
Patient returned call to office, transferred call from call center. Provided recommendations to the patient. She states she has been having weird headaches, wonders if they could be related to blood pressure. Explained no way to know unless she able to get a blood pressure kit and monitors it. Advised patient on parameters for calling about blood pressure.

## 2022-11-15 ENCOUNTER — Telehealth: Payer: Self-pay | Admitting: Cardiology

## 2022-11-15 MED ORDER — AMLODIPINE BESYLATE 5 MG PO TABS: 5.0000 mg | ORAL_TABLET | Freq: Every day | ORAL | 3 refills | Status: DC

## 2022-11-15 NOTE — Telephone Encounter (Signed)
*  STAT* If patient is at the pharmacy, call can be transferred to refill team.   1. Which medications need to be refilled? (please list name of each medication and dose if known)   amLODipine (NORVASC) 5 MG tablet     2. Would you like to learn more about the convenience, safety, & potential cost savings by using the [facility]? No   3. Are you open to using the [facility] (Type [facility].)No   4. Which pharmacy/location (including street and city if local pharmacy) is medication to be sent to? [id number] - [place], [place] - [address]     5. Do they need a 30 day or 90 day supply? 30 day

## 2022-11-15 NOTE — Telephone Encounter (Signed)
Amlodipine 5mg  sent to the patient's requested pharmacy.

## 2022-11-18 ENCOUNTER — Ambulatory Visit (HOSPITAL_BASED_OUTPATIENT_CLINIC_OR_DEPARTMENT_OTHER): Payer: Medicare PPO | Admitting: Cardiology

## 2022-11-27 ENCOUNTER — Emergency Department (HOSPITAL_COMMUNITY)
Admission: EM | Admit: 2022-11-27 | Discharge: 2022-11-28 | Disposition: A | Discharge status: Home / Self Care | Payer: Medicare PPO | Attending: Emergency Medicine | Admitting: Emergency Medicine

## 2022-11-27 ENCOUNTER — Emergency Department (HOSPITAL_COMMUNITY): Payer: Medicare PPO

## 2022-11-27 ENCOUNTER — Other Ambulatory Visit: Payer: Self-pay

## 2022-11-27 ENCOUNTER — Encounter (HOSPITAL_COMMUNITY): Payer: Self-pay

## 2022-11-27 DIAGNOSIS — R590 Localized enlarged lymph nodes: Secondary | ICD-10-CM | POA: Insufficient documentation

## 2022-11-27 DIAGNOSIS — R1084 Generalized abdominal pain: Secondary | ICD-10-CM | POA: Insufficient documentation

## 2022-11-27 DIAGNOSIS — R1032 Left lower quadrant pain: Secondary | ICD-10-CM | POA: Diagnosis not present

## 2022-11-27 DIAGNOSIS — N83202 Unspecified ovarian cyst, left side: Secondary | ICD-10-CM | POA: Diagnosis not present

## 2022-11-27 DIAGNOSIS — M7989 Other specified soft tissue disorders: Secondary | ICD-10-CM | POA: Diagnosis not present

## 2022-11-27 DIAGNOSIS — M79604 Pain in right leg: Secondary | ICD-10-CM | POA: Insufficient documentation

## 2022-11-27 DIAGNOSIS — K5730 Diverticulosis of large intestine without perforation or abscess without bleeding: Secondary | ICD-10-CM | POA: Diagnosis not present

## 2022-11-27 DIAGNOSIS — L304 Erythema intertrigo: Secondary | ICD-10-CM | POA: Diagnosis not present

## 2022-11-27 DIAGNOSIS — B353 Tinea pedis: Secondary | ICD-10-CM | POA: Diagnosis not present

## 2022-11-27 LAB — URINALYSIS, ROUTINE W REFLEX MICROSCOPIC
Glucose, UA: NEGATIVE mg/dL
Hgb urine dipstick: NEGATIVE
Ketones, ur: 5 mg/dL — AB
Nitrite: NEGATIVE
Protein, ur: 30 mg/dL — AB
Specific Gravity, Urine: 1.036 — ABNORMAL HIGH (ref 1.005–1.030)
pH: 5 (ref 5.0–8.0)

## 2022-11-27 LAB — CBC
HCT: 43.9 % (ref 36.0–46.0)
Hemoglobin: 14.4 g/dL (ref 12.0–15.0)
MCH: 31.3 pg (ref 26.0–34.0)
MCHC: 32.8 g/dL (ref 30.0–36.0)
MCV: 95.4 fL (ref 80.0–100.0)
Platelets: 341 10*3/uL (ref 150–400)
RBC: 4.6 MIL/uL (ref 3.87–5.11)
RDW: 12 % (ref 11.5–15.5)
WBC: 9.8 10*3/uL (ref 4.0–10.5)
nRBC: 0 % (ref 0.0–0.2)

## 2022-11-27 LAB — COMPREHENSIVE METABOLIC PANEL
ALT: 10 U/L (ref 0–44)
AST: 14 U/L — ABNORMAL LOW (ref 15–41)
Albumin: 3.6 g/dL (ref 3.5–5.0)
Alkaline Phosphatase: 104 U/L (ref 38–126)
Anion gap: 10 (ref 5–15)
BUN: 12 mg/dL (ref 8–23)
CO2: 22 mmol/L (ref 22–32)
Calcium: 9.3 mg/dL (ref 8.9–10.3)
Chloride: 109 mmol/L (ref 98–111)
Creatinine, Ser: 0.84 mg/dL (ref 0.44–1.00)
GFR, Estimated: >60 mL/min (ref >60)
Glucose, Bld: 107 mg/dL — ABNORMAL HIGH (ref 70–99)
Potassium: 3.3 mmol/L — ABNORMAL LOW (ref 3.5–5.1)
Sodium: 141 mmol/L (ref 135–145)
Total Bilirubin: 0.5 mg/dL (ref 0.3–1.2)
Total Protein: 7 g/dL (ref 6.5–8.1)

## 2022-11-27 LAB — LIPASE, BLOOD: Lipase: 34 U/L (ref 11–51)

## 2022-11-27 MED ORDER — IOHEXOL 350 MG/ML SOLN
75.0000 mL | Freq: Once | INTRAVENOUS | Status: AC | PRN
  Administered 2022-11-27: 75 mL via INTRAVENOUS

## 2022-11-27 NOTE — ED Triage Notes (Signed)
Pt c/o LLQ, RLQ abdominal painx2-3d. Pt c/o rash on anterior right lower leg and rash on anterior of left lower leg started yesterday. Pt c/o right leg swellingx3d. Pt has 3+ swelling of right lower leg. Pt has 2+ swelling of left lower leg

## 2022-11-27 NOTE — ED Notes (Signed)
Patient transported to CT 

## 2022-11-27 NOTE — ED Provider Notes (Signed)
[place] EMERGENCY DEPARTMENT AT [facility] Provider Note   CSN: [id number] Arrival date & time: 11/27/22  1641     History  Chief Complaint  Patient presents with   Abdominal Pain    Kayla [name] is a 77 y.o. female.  76 yo F with a chief complaints of right leg pain and swelling.  This has been going on for a few days.  She went to a dermatology appointment today and they told her she needed to come to be evaluated for a DVT.  She also has been complaining of diffuse lower abdominal discomfort and pain when she urinates.  This been going on for about a week.  She has had diverticulitis and thinks this feels somewhat similar.  No fevers no nausea no vomiting.   Abdominal Pain      Home Medications Prior to Admission medications   Medication Sig Start Date End Date Taking? Authorizing Provider  acetaminophen (TYLENOL) 500 MG tablet Take 1,000 mg by mouth in the morning, at noon, in the evening, and at bedtime.   Yes [provider]  amLODipine (NORVASC) 5 MG tablet Take 1 tablet (5 mg total) by mouth daily. 11/15/22  Yes Jodelle Red, MD  Cholecalciferol (VITAMIN D3) 1.25 MG (50000 UT) CAPS Take 1 capsule by mouth once a week. sunday 07/22/22  Yes [provider]  clotrimazole-betamethasone (LOTRISONE) cream Apply 1 Application topically 2 (two) times daily. 08/15/22  Yes [provider]  [facility] VITAMIN B12 1000 MCG tablet Take 2,000 mcg by mouth daily.   Yes [provider]  levothyroxine (SYNTHROID) 175 MCG tablet Take 175 mcg by mouth daily before breakfast.   Yes [provider]  hydrocortisone 2.5 % cream Apply 1 Application topically. 11/27/22   [provider]  ketoconazole (NIZORAL) 2 % cream Apply 1 Application topically 2 (two) times daily. 11/27/22   [provider]      Allergies    Ace inhibitors and Lisinopril    Review of Systems   Review of Systems  Gastrointestinal:  Positive for  abdominal pain.    Physical Exam Updated Vital Signs BP (!) 164/89 (BP Location: Right Arm)   Pulse 71   Temp 97.6 F (36.4 C) (Oral)   Resp 14   Ht 5\' 4"  (1.626 m)   Wt 91.2 kg   SpO2 99%   BMI 34.51 kg/m  Physical Exam Vitals and nursing note reviewed.  Constitutional:      General: She is not in acute distress.    Appearance: She is well-developed. She is not diaphoretic.  HENT:     Head: Normocephalic and atraumatic.  Eyes:     Pupils: Pupils are equal, round, and reactive to light.  Cardiovascular:     Rate and Rhythm: Normal rate and regular rhythm.     Heart sounds: No murmur heard.    No friction rub. No gallop.  Pulmonary:     Effort: Pulmonary effort is normal.     Breath sounds: No wheezing or rales.  Abdominal:     General: There is no distension.     Palpations: Abdomen is soft.     Tenderness: There is no abdominal tenderness.  Musculoskeletal:        General: No tenderness.     Cervical back: Normal range of motion and neck supple.  Skin:    General: Skin is warm and dry.     Comments: The patient has a erythematous rash to bilateral  lower extremity in patches.  She has some disclamation about the left toes.  She has intact pulses bilaterally.  Her right lower extremity is much more swollen than the left lower extremity.  Neurological:     Mental Status: She is alert and oriented to person, place, and time.  Psychiatric:        Behavior: Behavior normal.     ED Results / Procedures / Treatments   Labs (all labs ordered are listed, but only abnormal results are displayed) Labs Reviewed  COMPREHENSIVE METABOLIC PANEL - Abnormal; Notable for the following components:      Result Value   Potassium 3.3 (*)    Glucose, Bld 107 (*)    AST 14 (*)    All other components within normal limits  URINALYSIS, ROUTINE W REFLEX MICROSCOPIC - Abnormal; Notable for the following components:   APPearance CLOUDY (*)    Specific Gravity, Urine 1.036 (*)     Bilirubin Urine SMALL (*)    Ketones, ur 5 (*)    Protein, ur 30 (*)    Leukocytes,Ua SMALL (*)    Bacteria, UA RARE (*)    All other components within normal limits  LIPASE, BLOOD  CBC    EKG None  Radiology CT ABDOMEN PELVIS W CONTRAST  Result Date: 11/27/2022 CLINICAL DATA:  Left lower quadrant pain EXAM: CT ABDOMEN AND PELVIS WITH CONTRAST TECHNIQUE: Multidetector CT imaging of the abdomen and pelvis was performed using the standard protocol following bolus administration of intravenous contrast. RADIATION DOSE REDUCTION: This exam was performed according to the departmental dose-optimization program which includes automated exposure control, adjustment of the mA and/or kV according to patient size and/or use of iterative reconstruction technique. CONTRAST:  75mL OMNIPAQUE IOHEXOL 350 MG/ML SOLN COMPARISON:  05/14/2022 FINDINGS: Lower chest: No acute abnormality. Coronary artery and aortic atherosclerosis. Hepatobiliary: No focal liver abnormality is seen. Status post cholecystectomy. No biliary dilatation. Pancreas: No focal abnormality or ductal dilatation. Spleen: No focal abnormality.  Normal size. Adrenals/Urinary Tract: No adrenal abnormality. No focal renal abnormality. No stones or hydronephrosis. Urinary bladder is unremarkable. Stomach/Bowel: Sigmoid diverticulosis. No active diverticulitis. Stomach and small bowel decompressed, unremarkable. Vascular/Lymphatic: Aortic atherosclerosis. No evidence of aneurysm or adenopathy. Reproductive: 4.5 cm left ovarian cyst is unchanged since prior study. Uterus and right adnexa unremarkable. Other: No free fluid or free air. Musculoskeletal: No acute bony abnormality. IMPRESSION: 1. No acute findings in the abdomen or pelvis. 2. Sigmoid diverticulosis.  No active diverticulitis. 3. Stable left ovarian cyst. This is not substantially changed since 06/26/2018 compatible with benign cyst. 4. Aortic atherosclerosis. Electronically Signed   By: Charlett Nose M.D.   On: 11/27/2022 22:20    Procedures Procedures    Medications Ordered in ED Medications  iohexol (OMNIPAQUE) 350 MG/ML injection 75 mL (75 mLs Intravenous Contrast Given 11/27/22 2140)    ED Course/ Medical Decision Making/ A&P                                 Medical Decision Making Amount and/or Complexity of Data Reviewed Labs: ordered.   77 yo F with a chief complaints of lower abdominal discomfort.  This is been going on for a few days.  She is also had some right lower extremity pain and swelling.  She had seen a dermatologist today who encouraged her to come to the ED to be evaluated for a DVT.  Unfortunately the patient  arrived asked her vascular ultrasound had left for the day.  I discussed this with her.  Will obtain a outpatient DVT study.  With patient also having abdominal discomfort we will obtain a CT of the abdomen pelvis.  Patient metabolic panel without significant electrolyte abnormality.  LFTs and lipase are unremarkable.  No leukocytosis no anemia.  CT scan of the abdomen pelvis is without acute intra-abdominal pathology.  Will discharge home.  DVT study ordered for the morning.  11:17 PM:  I have discussed the diagnosis/risks/treatment options with the patient.  Evaluation and diagnostic testing in the emergency department does not suggest an emergent condition requiring admission or immediate intervention beyond what has been performed at this time.  They will follow up with PCP. We also discussed returning to the ED immediately if new or worsening sx occur. We discussed the sx which are most concerning (e.g., sudden worsening pain, fever, inability to tolerate by mouth) that necessitate immediate return. Medications administered to the patient during their visit and any new prescriptions provided to the patient are listed below.  Medications given during this visit Medications  iohexol (OMNIPAQUE) 350 MG/ML injection 75 mL (75 mLs Intravenous Contrast  Given 11/27/22 2140)     The patient appears reasonably screen and/or stabilized for discharge and I doubt any other medical condition or other [facility] requiring further screening, evaluation, or treatment in the ED at this time prior to discharge.          Final Clinical Impression(s) / ED Diagnoses Final diagnoses:  Generalized abdominal pain  Leg swelling    Rx / DC Orders ED Discharge Orders          Ordered    LE [facility]ENOUS        11/27/22 2314              Melene Plan, DO 11/27/22 2317

## 2022-11-27 NOTE — ED Provider Triage Note (Signed)
Emergency Medicine Provider Triage Evaluation Note  Kayla [name] , a 77 y.o. female  was evaluated in triage.  Pt complains of LLQ and RLQ abdominal pain x 2-3 days.  Notes previous history of diverticulitis and her symptoms feel similar.  Denies nausea or vomiting.  She also notes concerns for rash to the right lower extremity along with swelling.  Was seen by her dermatologist who told her, to the emergency department for further evaluation of her symptoms to rule out a DVT. Review of Systems  Positive:  Negative:   Physical Exam  BP (!) 145/94 (BP Location: Left Arm)   Pulse 91   Temp 97.8 F (36.6 C) (Oral)   Resp 18   Ht 5\' 4"  (1.626 m)   Wt 91.2 kg   SpO2 97%   BMI 34.51 kg/m  Gen:   Awake, no distress   Resp:  Normal effort  MSK:   Moves extremities without difficulty  Other:  Able to ambulate without assistance or difficulty,.  Tenderness to palpation noted to left upper quadrant, left lower quadrant, suprapubic region.  Edema/swelling noted to right lower extremity.  Petechial type rash noted to shin.  Medical Decision Making  Medically screening exam initiated at 6:39 PM.  Appropriate orders placed.  Geraldo Pitter was informed that the remainder of the evaluation will be completed by another provider, this initial triage assessment does not replace that evaluation, and the importance of remaining in the ED until their evaluation is complete.  Workup initiated   Dolce Sylvia A, PA-C 11/27/22 2031

## 2022-11-27 NOTE — Discharge Instructions (Signed)
Your CT scan of your abdomen pelvis did not show any obvious cause of your abdominal discomfort.  I have placed an order for you to get your DVT study in the morning.

## 2022-11-28 ENCOUNTER — Ambulatory Visit (HOSPITAL_COMMUNITY)
Admission: RE | Admit: 2022-11-28 | Discharge: 2022-11-28 | Disposition: A | Discharge status: Home / Self Care | Payer: Medicare PPO | Source: Ambulatory Visit | Attending: Emergency Medicine | Admitting: Emergency Medicine

## 2022-11-28 DIAGNOSIS — M7989 Other specified soft tissue disorders: Secondary | ICD-10-CM

## 2022-11-28 DIAGNOSIS — M79604 Pain in right leg: Secondary | ICD-10-CM | POA: Insufficient documentation

## 2022-11-28 DIAGNOSIS — R590 Localized enlarged lymph nodes: Secondary | ICD-10-CM | POA: Insufficient documentation

## 2022-12-03 DIAGNOSIS — F411 Generalized anxiety disorder: Secondary | ICD-10-CM | POA: Diagnosis not present

## 2022-12-03 DIAGNOSIS — N189 Chronic kidney disease, unspecified: Secondary | ICD-10-CM | POA: Diagnosis not present

## 2022-12-03 DIAGNOSIS — R300 Dysuria: Secondary | ICD-10-CM | POA: Diagnosis not present

## 2022-12-03 DIAGNOSIS — I890 Lymphedema, not elsewhere classified: Secondary | ICD-10-CM | POA: Diagnosis not present

## 2022-12-03 DIAGNOSIS — I10 Essential (primary) hypertension: Secondary | ICD-10-CM | POA: Diagnosis not present

## 2022-12-03 DIAGNOSIS — N390 Urinary tract infection, site not specified: Secondary | ICD-10-CM | POA: Diagnosis not present

## 2022-12-10 ENCOUNTER — Telehealth: Payer: Self-pay

## 2022-12-10 NOTE — Telephone Encounter (Signed)
Transition Care Management Unsuccessful Follow-up Telephone Call  Date of discharge and from where:  11/28/2022 [facility]  Attempts:  1st Attempt  Reason for unsuccessful TCM follow-up call:  Left voice message  Briyan Kleven [facility]  [facility] Population Health Community Resource Care Guide   [email or web address]  ?? [phone]   Website: [email or web address]  [email or web address]

## 2022-12-11 ENCOUNTER — Telehealth: Payer: Self-pay

## 2022-12-11 NOTE — Telephone Encounter (Signed)
Transition Care Management Follow-up Telephone Call Date of discharge and from where: 11/28/2022 [facility] How have you been since you were released from the hospital? Patient stated she is not feeling much better. Any questions or concerns? No  Items Reviewed: Did the pt receive and understand the discharge instructions provided? Yes  Medications obtained and verified?  No medication prescribed. Other? No  Any new allergies since your discharge? No  Dietary orders reviewed? Yes Do you have support at home? Yes   Follow up appointments reviewed:  PCP Hospital f/u appt confirmed? No  Scheduled to see  on  @ . Specialist Hospital f/u appt confirmed? No  Scheduled to see  on  @ . Are transportation arrangements needed? No  If their condition worsens, is the pt aware to call PCP or go to the Emergency Dept.? Yes Was the patient provided with contact information for the PCP's office or ED? Yes Was to pt encouraged to call back with questions or concerns? Yes  Demaree Liberto [facility]  [facility] Population Health Community Resource Care Guide   [email or web address]  ?? [phone]   Website: [email or web address]  [email or web address]

## 2022-12-12 ENCOUNTER — Telehealth: Payer: Self-pay | Admitting: Family Medicine

## 2022-12-12 NOTE — Telephone Encounter (Signed)
The patient called inquiring about the possibility of being re-established as a new patient. It appears she was previously under your care in 2016. She explained that she switched primary care providers due to the convenience of a shorter commute. However, she mentioned that her health has declined since making the change. She is hopeful that her request will be approved, expressing that she greatly values Dr. Leta Jungling care and trusts him as her provider. Please advise if it is possible to re-establish her as a patient under your care.

## 2022-12-12 NOTE — Telephone Encounter (Signed)
Called pt  LVM.

## 2022-12-12 NOTE — Telephone Encounter (Signed)
Unable to see pt

## 2023-01-20 ENCOUNTER — Encounter: Payer: Self-pay | Admitting: *Deleted

## 2023-01-20 ENCOUNTER — Ambulatory Visit
Admission: EM | Admit: 2023-01-20 | Discharge: 2023-01-20 | Disposition: A | Discharge status: Home / Self Care | Payer: Medicare PPO | Attending: Internal Medicine | Admitting: Internal Medicine

## 2023-01-20 DIAGNOSIS — M79604 Pain in right leg: Secondary | ICD-10-CM | POA: Diagnosis not present

## 2023-01-20 DIAGNOSIS — B372 Candidiasis of skin and nail: Secondary | ICD-10-CM | POA: Diagnosis not present

## 2023-01-20 DIAGNOSIS — M79605 Pain in left leg: Secondary | ICD-10-CM

## 2023-01-20 HISTORY — DX: Unspecified hearing loss, bilateral: H91.93

## 2023-01-20 MED ORDER — CLOTRIMAZOLE 1 % EX CREA: TOPICAL_CREAM | CUTANEOUS | 0 refills | Status: DC

## 2023-01-20 MED ORDER — FLUCONAZOLE 150 MG PO TABS: 150.0000 mg | ORAL_TABLET | ORAL | 0 refills | Status: DC

## 2023-01-20 NOTE — ED Triage Notes (Signed)
C/O significant "burning" rash to left torso and under left breast - states her PCP gave her clortrimazole & betamethasone cream that she took off/on x months, which helped. When she saw dermatologist recently for rash on her leg, she was told that Rx was too strong and was given ketoconazole for torso rash instead, which hasn't been working. States she had a family emergency and missed her f/u dermatology appt. Was told by PCP office that she can no longer go there. Also c/o waking this AM with "electric shock pains" in BLE and started with pains in her neck. States took some Tyl and sensation has calmed down.

## 2023-01-20 NOTE — ED Provider Notes (Addendum)
[name]    CSN: [id number] Arrival date & time: 01/20/23  0955      History   Chief Complaint Chief Complaint  Patient presents with   Rash    HPI Kayla [name] is a 77 y.o. female.   Patient presents with multiple different chief complaints today.  Patient reports that she has a "burning" rash underneath her left breast that has been present for almost a year.  She has seen PCP for this who prescribed her clotrimazole/betamethasone cream.  Reports that this was helpful but she saw dermatology after this and they prescribed her ketoconazole cream with no improvement.  Denies any changes to lotions, soaps, detergents, foods that could have caused the rash.  Denies any known sick contacts or associated fever.  States that she had a follow-up appointment with her dermatologist but missed this due to a family emergency.  She has rescheduled a follow-up appointment for sometime in November but patient is not sure the exact date.  Patient also complaining of "electric shocks" in bilateral lower extremities that have been present intermittently since 2022.  Patient states that she somehow injured her back and was hospitalized for this in 2022.  States that after she was discharged, she developed cellulitis in the lower extremities and has been having pain in that area ever since.  She has seen PCP and several neurologists for this.  She was prescribed gabapentin by neurology in 2023 but reports it made her too drowsy so she stopped taking it.  She was also referred to a pain clinic but reports that she was not aware of this.  Patient states that she recently attempted to follow-up with her PCP but they told her that "she was too complicated and they would no longer see her".  She did schedule an appointment with a new primary care sometime in November. Triage notes states that pain was also present in neck but patient does not mention this to me.     Rash   Past Medical History:   Diagnosis Date   Anemia    Anxiety    Asthma    B12 deficiency    Bursitis    Chronic LBP    Chronic UTI    Diastolic dysfunction    Fibromyalgia    GERD (gastroesophageal reflux disease)    Hearing impaired person, bilateral    per pt   History of low potassium    Hyperlipemia    Hypertension    Hypothyroid    IBS (irritable bowel syndrome)    Inflammatory osteoarthritis    Kidney infection    Psoriatic arthritis (HCC)    S/P cardiac cath 11/30/2007   normal coronaries - Dr. Excell Seltzer (Dr. Rennis Golden reviewed films on 01/10/2016)    Patient Active Problem List   Diagnosis Date Noted   Personal history of COVID-19 08/24/2020   Body mass index (BMI) 33.0-33.9, adult 06/20/2020   Status post lumbar microdiscectomy 06/20/2020   Essential (primary) hypertension 06/20/2020   Bilateral leg edema 06/17/2020   Cellulitis of leg, left 06/17/2020   Cellulitis of leg, right 06/17/2020   Lumbar herniated disc 05/25/2020   Hyponatremia 05/12/2020   Sciatica 05/12/2020   COVID-19 05/09/2020   Hypokalemia 05/04/2020   Dysphagia 04/17/2020   Renal insufficiency 12/29/2018   Cyst of left ovary 12/29/2018   Esophageal dysmotility 12/29/2018   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/29/2018   Acquired hypothyroidism 12/29/2018   Chronic maxillary sinusitis 07/07/2018   Snoring 06/01/2018  Costochondritis 06/01/2018   Family history of heart disease 06/01/2018   Class 2 severe obesity due to excess calories with serious comorbidity and body mass index (BMI) of 38.0 to 38.9 in adult [facility]) 06/01/2018   Multiple atypical skin moles 04/28/2017   Depression, recurrent (HCC) 07/16/2016   Adjustment disorder with mixed anxiety and depressed mood 01/15/2016   Fe Def Anemia 12/22/2015   Idiopathic scoliosis and kyphoscoliosis 12/20/2015   Osteopenia 12/20/2015   Fibromyalgia 08/02/2014   GAD (generalized anxiety disorder) 09/17/2012   Vitamin D deficiency 01/24/2012   Fatigue  10/14/2011   B12 deficiency    Hyperlipidemia 11/30/2007   Coronary artery disease involving native heart without angina pectoris 11/30/2007   Essential hypertension 01/27/2007   GERD 01/27/2007   IBS 01/27/2007    Past Surgical History:  Procedure Laterality Date   CESAREAN SECTION  06/10/1975   CHOLECYSTECTOMY     LUMBAR LAMINECTOMY/DECOMPRESSION MICRODISCECTOMY Right 05/25/2020   Procedure: MICRODISCECTOMY Right Lumbar three - four;  Surgeon: Tressie Stalker, MD;  Location: [facility] OR;  Service: Neurosurgery;  Laterality: Right;   NECK SURGERY  1998   RADIOLOGY WITH ANESTHESIA N/A 05/16/2020   Procedure: MRI LUMBAR SPINE W/CONTRAST;  Surgeon: Radiologist, Medication, MD;  Location: [facility] OR;  Service: Radiology;  Laterality: N/A;   repair broken C6 & C7      OB History   No obstetric history on file.      Home Medications    Prior to Admission medications   Medication Sig Start Date End Date Taking? Authorizing Provider  amLODipine (NORVASC) 5 MG tablet Take 1 tablet (5 mg total) by mouth daily. 11/15/22  Yes Jodelle Red, MD  Cholecalciferol (VITAMIN D3) 1.25 MG (50000 UT) CAPS Take 1 capsule by mouth once a week. sunday 07/22/22  Yes [provider]  clotrimazole (LOTRIMIN) 1 % cream Apply to affected area 2 times daily 01/20/23  Yes Breeonna Mone, [place] E, FNP  [employer] VITAMIN B12 1000 MCG tablet Take 2,000 mcg by mouth daily.   Yes [provider]  fluconazole (DIFLUCAN) 150 MG tablet Take 1 tablet (150 mg total) by mouth once a week. 01/20/23  Yes Majd Tissue, Acie Fredrickson, FNP  levothyroxine (SYNTHROID) 175 MCG tablet Take 175 mcg by mouth daily before breakfast.   Yes [provider]  acetaminophen (TYLENOL) 500 MG tablet Take 1,000 mg by mouth in the morning, at noon, in the evening, and at bedtime.    [provider]  hydrocortisone 2.5 % cream Apply 1 Application topically. 11/27/22   [provider]    Family History Family History  Problem  Relation Age of Onset   Heart attack Father 71   Lung cancer Other        uncle, non smoker   Leukemia Other        uncle   Cancer Mother        oral   Stroke Mother    Alzheimer's disease Mother        at 39   Cancer Maternal Aunt        breast   Alcohol abuse Maternal Aunt    Alcohol abuse Maternal Uncle    Cancer Maternal Uncle        lung, stomach, oral   Cancer Paternal Uncle        lung   Congestive Heart Failure Maternal Grandfather    Heart disease Maternal Grandfather    Heart attack Paternal Grandfather    Cancer Paternal Uncle  bone   Brain cancer Maternal Grandmother    Alzheimer's disease Other        maternal great grandmother    Colon cancer Neg Hx    Breast cancer Neg Hx    Diabetes Neg Hx     Social History Social History   Tobacco Use   Smoking status: Never    Passive exposure: Never   Smokeless tobacco: Never  Vaping Use   Vaping status: Never Used  Substance Use Topics   Alcohol use: Never   Drug use: No     Allergies   Ace inhibitors and Lisinopril   Review of Systems Review of Systems Per HPI  Physical Exam Triage Vital Signs ED Triage Vitals [01/20/23 1206]  Encounter Vitals Group     BP (!) 169/101     Systolic BP Percentile      Diastolic BP Percentile      Pulse Rate 77     Resp 18     Temp (!) 97.5 F (36.4 C)     Temp Source Oral     SpO2 98 %     Weight      Height      Head Circumference      Peak Flow      Pain Score      Pain Loc      Pain Education      Exclude from Growth Chart    No data found.  Updated Vital Signs BP (!) 168/89   Pulse 77   Temp (!) 97.5 F (36.4 C) (Oral)   Resp 18   SpO2 98%   Visual Acuity Right Eye Distance:   Left Eye Distance:   Bilateral Distance:    Right Eye Near:   Left Eye Near:    Bilateral Near:     Physical Exam Exam conducted with a chaperone present.  Constitutional:      General: She is not in acute distress.    Appearance: Normal appearance.  She is not toxic-appearing or diaphoretic.  HENT:     Head: Normocephalic and atraumatic.  Eyes:     Extraocular Movements: Extraocular movements intact.     Conjunctiva/sclera: Conjunctivae normal.  Pulmonary:     Effort: Pulmonary effort is normal.  Skin:    Comments: Patient has diffuse erythematous rash underneath left breast that extends into torso.  No tenderness to palpation throughout lower extremities.  Neurovascularly intact.  Mild discoloration scattered throughout including the toes which patient reports this is baseline.  Full range of motion of lower extremities present.  Neurological:     General: No focal deficit present.     Mental Status: She is alert and oriented to person, place, and time. Mental status is at baseline.  Psychiatric:        Mood and Affect: Mood normal.        Behavior: Behavior normal.        Thought Content: Thought content normal.        Judgment: Judgment normal.      UC Treatments / Results  Labs (all labs ordered are listed, but only abnormal results are displayed) Labs Reviewed - No data to display  EKG   Radiology No results found.  Procedures Procedures (including critical care time)  Medications Ordered in UC Medications - No data to display  Initial Impression / Assessment and Plan / UC Course  I have reviewed the triage vital signs and the nursing notes.  Pertinent labs &  imaging results that were available during my care of the patient were reviewed by me and considered in my medical decision making (see chart for details).     1.  Rash  Rash appears to be consistent with candidiasis of the skin underneath the breast.  Will prescribe clotrimazole given patient reports this is more helpful. She denies that she is using any creams currently. I also think patient would benefit from oral medication given how long rash has been present.  Will prescribe Diflucan weekly for a few weeks.  Kidney function and liver enzymes  appear unremarkable so this should be safe.  Encouraged her to follow-up as soon as possible with her dermatologist as well.  2.  Bilateral lower extremity pain  This has been present for multiple years so educated patient she needs to follow-up with PCP and specialist for further evaluation and management.  Gabapentin has already been trialed with unsuccessful resolution of symptoms as patient was not able to tolerate this.  Will place ambulatory referral to neurology given patient reports that she called [place] [facility] but they were requiring referral.  Patient already has established new primary care appointment in November.  No significant signs of DVT or cellulitis on exam especially given the symptoms are chronic since hospitalization a few years ago.  Discussed with patient neuropathy versus vascular compromise could be causing chronic symptoms.  Patient is neurovascularly intact on exam which is reassuring that no emergent evaluation is necessary.  Discussed strict return and ER precautions.  Patient verbalized understanding and was agreeable with plan. BP also elevated today so encouraged her to monitor this at home.  Final Clinical Impressions(s) / UC Diagnoses   Final diagnoses:  Candidal skin infection  Bilateral lower extremity pain     Discharge Instructions      I have prescribed you pills and a cream to treat fungal infection of your skin.  Please call your dermatologist to discuss additional treatment.  I placed a referral to neurology for you.  If they do not call you, this will have to go through primary care appointment.  You may have to call them yourself to schedule an appointment as well.  Please monitor your blood pressure closely at home as well given it was mildly elevated today.    ED Prescriptions     Medication Sig Dispense Auth. Provider   clotrimazole (LOTRIMIN) 1 % cream Apply to affected area 2 times daily 40 g [place], Rolly [place]ter E, FNP   fluconazole (DIFLUCAN)  150 MG tablet Take 1 tablet (150 mg total) by mouth once a week. 3 tablet [place], [place] E, [place]      I have reviewed the PDMP during this encounter.   Gustavus Bryant, [place] 01/20/23 1501    Gustavus Bryant, [place] 01/20/23 1501    Gustavus Bryant, [place] 01/20/23 [phone]

## 2023-01-20 NOTE — Discharge Instructions (Signed)
I have prescribed you pills and a cream to treat fungal infection of your skin.  Please call your dermatologist to discuss additional treatment.  I placed a referral to neurology for you.  If they do not call you, this will have to go through primary care appointment.  You may have to call them yourself to schedule an appointment as well.  Please monitor your blood pressure closely at home as well given it was mildly elevated today.

## 2023-02-10 ENCOUNTER — Emergency Department (HOSPITAL_COMMUNITY)
Admission: EM | Admit: 2023-02-10 | Discharge: 2023-02-10 | Disposition: A | Discharge status: Home / Self Care | Payer: Medicare PPO

## 2023-02-10 ENCOUNTER — Emergency Department (HOSPITAL_COMMUNITY): Payer: Medicare PPO

## 2023-02-10 ENCOUNTER — Other Ambulatory Visit: Payer: Self-pay

## 2023-02-10 ENCOUNTER — Encounter (HOSPITAL_COMMUNITY): Payer: Self-pay

## 2023-02-10 DIAGNOSIS — K429 Umbilical hernia without obstruction or gangrene: Secondary | ICD-10-CM | POA: Diagnosis not present

## 2023-02-10 DIAGNOSIS — R300 Dysuria: Secondary | ICD-10-CM

## 2023-02-10 DIAGNOSIS — R1030 Lower abdominal pain, unspecified: Secondary | ICD-10-CM | POA: Diagnosis not present

## 2023-02-10 DIAGNOSIS — R109 Unspecified abdominal pain: Secondary | ICD-10-CM | POA: Diagnosis not present

## 2023-02-10 DIAGNOSIS — I10 Essential (primary) hypertension: Secondary | ICD-10-CM | POA: Diagnosis not present

## 2023-02-10 DIAGNOSIS — K5730 Diverticulosis of large intestine without perforation or abscess without bleeding: Secondary | ICD-10-CM | POA: Diagnosis not present

## 2023-02-10 LAB — URINALYSIS, ROUTINE W REFLEX MICROSCOPIC

## 2023-02-10 LAB — CBC
HCT: 48 % — ABNORMAL HIGH (ref 36.0–46.0)
Hemoglobin: 15.6 g/dL — ABNORMAL HIGH (ref 12.0–15.0)
MCH: 30.9 pg (ref 26.0–34.0)
MCHC: 32.5 g/dL (ref 30.0–36.0)
MCV: 95 fL (ref 80.0–100.0)
Platelets: 384 10*3/uL (ref 150–400)
RBC: 5.05 MIL/uL (ref 3.87–5.11)
RDW: 12.7 % (ref 11.5–15.5)
WBC: 11.7 10*3/uL — ABNORMAL HIGH (ref 4.0–10.5)
nRBC: 0 % (ref 0.0–0.2)

## 2023-02-10 LAB — COMPREHENSIVE METABOLIC PANEL
ALT: 9 U/L (ref 0–44)
AST: 16 U/L (ref 15–41)
Albumin: 3.7 g/dL (ref 3.5–5.0)
Alkaline Phosphatase: 94 U/L (ref 38–126)
Anion gap: 13 (ref 5–15)
BUN: 8 mg/dL (ref 8–23)
CO2: 19 mmol/L — ABNORMAL LOW (ref 22–32)
Calcium: 9.5 mg/dL (ref 8.9–10.3)
Chloride: 108 mmol/L (ref 98–111)
Creatinine, Ser: 1.03 mg/dL — ABNORMAL HIGH (ref 0.44–1.00)
GFR, Estimated: 56 mL/min — ABNORMAL LOW (ref >60)
Glucose, Bld: 100 mg/dL — ABNORMAL HIGH (ref 70–99)
Potassium: 3.2 mmol/L — ABNORMAL LOW (ref 3.5–5.1)
Sodium: 140 mmol/L (ref 135–145)
Total Bilirubin: 0.7 mg/dL (ref <1.2)
Total Protein: 6.8 g/dL (ref 6.5–8.1)

## 2023-02-10 LAB — URINALYSIS, MICROSCOPIC (REFLEX): WBC, UA: >50 WBC/hpf (ref 0–5)

## 2023-02-10 LAB — LIPASE, BLOOD: Lipase: 37 U/L (ref 11–51)

## 2023-02-10 LAB — CBG MONITORING, ED: Glucose-Capillary: 92 mg/dL (ref 70–99)

## 2023-02-10 MED ORDER — CEFDINIR 300 MG PO CAPS
300.0000 mg | ORAL_CAPSULE | Freq: Once | ORAL | Status: AC
  Administered 2023-02-10: 300 mg via ORAL
  Filled 2023-02-10: qty 1

## 2023-02-10 MED ORDER — MORPHINE SULFATE (PF) 4 MG/ML IV SOLN
4.0000 mg | Freq: Once | INTRAVENOUS | Status: AC
  Administered 2023-02-10: 4 mg via INTRAVENOUS
  Filled 2023-02-10: qty 1

## 2023-02-10 MED ORDER — SODIUM CHLORIDE 0.9 % IV BOLUS
1000.0000 mL | Freq: Once | INTRAVENOUS | Status: AC
  Administered 2023-02-10: 1000 mL via INTRAVENOUS

## 2023-02-10 MED ORDER — CEFDINIR 300 MG PO CAPS
300.0000 mg | ORAL_CAPSULE | Freq: Two times a day (BID) | ORAL | 0 refills | Status: AC
Start: 2023-02-10 — End: 2023-02-24

## 2023-02-10 MED ORDER — ONDANSETRON 4 MG PO TBDP
8.0000 mg | ORAL_TABLET | Freq: Once | ORAL | Status: AC
  Administered 2023-02-10: 8 mg via ORAL
  Filled 2023-02-10: qty 2

## 2023-02-10 NOTE — ED Triage Notes (Signed)
Pt reports also having a rash on her belly that is itchy and painful

## 2023-02-10 NOTE — ED Notes (Signed)
Patient refused CT scans, said she still feels weird. Vitals are stable asked the provider to come see her.

## 2023-02-10 NOTE — ED Notes (Signed)
Provider at bedside

## 2023-02-10 NOTE — ED Notes (Signed)
Patient said she felt weird, the symptoms described are side effects of morphine. I alerted the provider, got a fresh set of vitals and a blood glucose. All were normal. The provider said he would come to bedside if needed, patient said she thinks she is okay now. She is now on her phone watching videos.

## 2023-02-10 NOTE — Discharge Instructions (Addendum)
Overall workup is reassuring.  I am going to go ahead and treat you for a kidney infection associated with UTI.  Please follow-up with your PCP for reevaluation.  Starting on Omnicef which is an antibiotic.  Please take it for the next 14 days.  Please finish the course even if you are feeling better.  If you develop fever, worsening urinary symptoms, worsening abdominal pain, persistent nausea vomiting diarrhea or any other concerning symptom please return emergency department further evaluation.

## 2023-02-10 NOTE — ED Triage Notes (Signed)
Pt coming in from home. Pt reports waking up this morning and having lower abdominal and pelvic pain. Pt reports that she attempted to urinate and was only able to go a little bit. Pt reports taking azo at home for the pain. Pt having urgency and frequency.

## 2023-02-10 NOTE — ED Provider Notes (Signed)
[place] EMERGENCY DEPARTMENT AT [facility] Provider Note   CSN: [id number] Arrival date & time: 02/10/23  1219     History  Chief Complaint  Patient presents with   Abdominal Pain   Pelvic Pain   HPI Kayla [name] is a 77 y.o. female with history of chronic UTIs, kidney infection, GERD presenting for abdominal pain. States she woke up this morning and had intense lower abdominal pain.  Denies any nausea vomiting diarrhea.  Does endorse urinary urgency and frequency.  States she took an Azo this morning for the pain and today improved.  Denies fever and chills. States she had a normal bowel movement yesterday.  States she has had many UTIs in her life and symptoms are very similar.     Abdominal Pain Pelvic Pain Associated symptoms include abdominal pain.       Home Medications Prior to Admission medications   Medication Sig Start Date End Date Taking? Authorizing Provider  acetaminophen (TYLENOL) 500 MG tablet Take 1,000 mg by mouth in the morning, at noon, in the evening, and at bedtime.   Yes [provider]  amLODipine (NORVASC) 5 MG tablet Take 1 tablet (5 mg total) by mouth daily. 11/15/22  Yes Jodelle Red, MD  bismuth subsalicylate (PEPTO BISMOL) 262 MG/15ML suspension Take 30 mLs by mouth every 6 (six) hours as needed for indigestion or diarrhea or loose stools.   Yes [provider]  cefdinir (OMNICEF) 300 MG capsule Take 1 capsule (300 mg total) by mouth 2 (two) times daily for 14 days. 02/10/23 02/24/23 Yes Gareth Eagle, PA-C  Cholecalciferol (VITAMIN D3) 1.25 MG (50000 UT) CAPS Take 1 capsule by mouth once a week. Sunday 07/22/22  Yes [provider]  clotrimazole (LOTRIMIN) 1 % cream Apply to affected area 2 times daily 01/20/23  Yes [place], [place] E, FNP  [employer] VITAMIN B12 1000 MCG tablet Take 2,000 mcg by mouth daily.   Yes [provider]  levothyroxine (SYNTHROID) 175 MCG tablet Take 175 mcg by mouth daily  before breakfast.   Yes [provider]      Allergies    Ace inhibitors and Lisinopril    Review of Systems   Review of Systems  Gastrointestinal:  Positive for abdominal pain.  Genitourinary:  Positive for pelvic pain.    Physical Exam   Vitals:   02/10/23 2030 02/10/23 2204  BP: 119/86 (!) 163/89  Pulse: 77 90  Resp: 19 16  Temp:  98 F (36.7 C)  SpO2: 98% 100%    CONSTITUTIONAL:  well-appearing, NAD NEURO:  Alert and oriented x 3, CN 3-12 grossly intact EYES:  eyes equal and reactive ENT/NECK:  Supple, no stridor  CARDIO:  regular rate and rhythm, appears well-perfused  PULM:  No respiratory distress, CTAB GI/GU:  non-distended, soft, left CVA tenderness MSK/SPINE:  No gross deformities, no edema, moves all extremities  SKIN:  no rash, atraumatic  *Additional and/or pertinent findings included in MDM below  ED Results / Procedures / Treatments   Labs (all labs ordered are listed, but only abnormal results are displayed) Labs Reviewed  COMPREHENSIVE METABOLIC PANEL - Abnormal; Notable for the following components:      Result Value   Potassium 3.2 (*)    CO2 19 (*)    Glucose, Bld 100 (*)    Creatinine, Ser 1.03 (*)    GFR, Estimated 56 (*)    All other components within normal limits  CBC - Abnormal; Notable  for the following components:   WBC 11.7 (*)    Hemoglobin 15.6 (*)    HCT 48.0 (*)    All other components within normal limits  URINALYSIS, ROUTINE W REFLEX MICROSCOPIC - Abnormal; Notable for the following components:   Color, Urine ORANGE (*)    APPearance TURBID (*)    Glucose, UA   (*)    Value: TEST NOT REPORTED DUE TO COLOR INTERFERENCE OF URINE PIGMENT   Hgb urine dipstick   (*)    Value: TEST NOT REPORTED DUE TO COLOR INTERFERENCE OF URINE PIGMENT   Bilirubin Urine   (*)    Value: TEST NOT REPORTED DUE TO COLOR INTERFERENCE OF URINE PIGMENT   Ketones, ur   (*)    Value: TEST NOT REPORTED DUE TO COLOR INTERFERENCE OF URINE  PIGMENT   Protein, ur   (*)    Value: TEST NOT REPORTED DUE TO COLOR INTERFERENCE OF URINE PIGMENT   Nitrite   (*)    Value: TEST NOT REPORTED DUE TO COLOR INTERFERENCE OF URINE PIGMENT   Leukocytes,Ua   (*)    Value: TEST NOT REPORTED DUE TO COLOR INTERFERENCE OF URINE PIGMENT   All other components within normal limits  URINALYSIS, MICROSCOPIC (REFLEX) - Abnormal; Notable for the following components:   Bacteria, UA MANY (*)    All other components within normal limits  LIPASE, BLOOD  CBG MONITORING, ED    EKG None  Radiology CT ABDOMEN PELVIS WO CONTRAST  Result Date: 02/10/2023 CLINICAL DATA:  Abdominal pain. EXAM: CT ABDOMEN AND PELVIS WITHOUT CONTRAST TECHNIQUE: Multidetector CT imaging of the abdomen and pelvis was performed following the standard protocol without IV contrast. RADIATION DOSE REDUCTION: This exam was performed according to the departmental dose-optimization program which includes automated exposure control, adjustment of the mA and/or kV according to patient size and/or use of iterative reconstruction technique. COMPARISON:  11/27/2022, additional priors. FINDINGS: Lower chest: Subsegmental atelectasis in the right lower lobe. Coronary artery and mitral annulus calcifications. Hepatobiliary: No focal liver abnormality is seen. Status post cholecystectomy. No biliary dilatation. Pancreas: No ductal dilatation or inflammation. Spleen: Normal in size without focal abnormality. Adrenals/Urinary Tract: Normal adrenal glands. Lobulated bilateral renal contours. No hydronephrosis. No renal calculi. No evidence of focal renal abnormality on this unenhanced exam. Unremarkable urinary bladder. Stomach/Bowel: Submucosal fatty infiltration in the stomach. Faint edema in the right small bowel mesentery, best appreciated on coronal series 6, image 75. This is been intermittently present on prior exam and appears to be related to a small bowel diverticulum. Scattered colonic diverticula  without diverticulitis. High-riding cecum in the right mid abdomen. Appendix not confidently visualized. Vascular/Lymphatic: Aortic atherosclerosis without aneurysm. No adenopathy. Reproductive: Simple appearing 4.5 cm left adnexal cyst, series 3, image 58. This is unchanged from prior exam. This is been present since at least pelvic ultrasound of 2020 and not significantly changed. Other: No ascites or free air. Small fat containing umbilical hernia. Musculoskeletal: Chronic lumbar degenerative change. There are no acute or suspicious osseous abnormalities. IMPRESSION: 1. Faint edema in the right small bowel mesentery, appears to be related to a small bowel diverticulum. This has been intermittently present on prior exam and likely represents mild small bowel diverticulitis. 2. Colonic diverticulosis without diverticulitis. 3. Simple appearing 4.5 cm left adnexal cyst, unchanged from prior exam. This has been present since at least pelvic ultrasound of 2020 and not significantly changed. Given stability over greater than 4 years this is likely benign. No specific imaging  follow-up is needed. Aortic Atherosclerosis (ICD10-I70.0). Electronically Signed   By: Narda Rutherford M.D.   On: 02/10/2023 21:35    Procedures Procedures    Medications Ordered in ED Medications  cefdinir (OMNICEF) capsule 300 mg (has no administration in time range)  sodium chloride 0.9 % bolus 1,000 mL (1,000 mLs Intravenous New Bag/Given 02/10/23 1908)  ondansetron (ZOFRAN-ODT) disintegrating tablet 8 mg (8 mg Oral Given 02/10/23 1905)  morphine (PF) 4 MG/ML injection 4 mg (4 mg Intravenous Given 02/10/23 1905)    ED Course/ Medical Decision Making/ A&P                                 Medical Decision Making Amount and/or Complexity of Data Reviewed Labs: ordered. Radiology: ordered.  Risk Prescription drug management.   Initial Impression and Ddx 77 year old well-appearing female presenting for dysuria and lower  abdominal pain.  Exam notable for left-sided CVA tenderness.  DDx includes pyelonephritis, nephrolithiasis, renal obstruction, UTI, other. Patient PMH that increases complexity of ED encounter:    Interpretation of Diagnostics - I independent reviewed and interpreted the labs as followed:  history of chronic UTIs, kidney infection, GERD   - I independently visualized the following imaging with scope of interpretation limited to determining acute life threatening conditions related to emergency care: CT abdomen/pelvis non con, which revealed an area of diverticulitis which has been read presented on prior scans but otherwise non acute  -I personally reviewed interpret EKG which revealed normal sinus rhythm  Patient Reassessment and Ultimate Disposition/Management On reassessment, patient stated that symptoms had improved but that she felt a bit "weird" after receiving the morphine. After another hour or so of observation, patient remained well, no acute distress and hemodynamically stable.  CT did show some evidence of mild area of diverticulitis but has been seen on prior scans.  However, given her urinary symptoms with flank tenderness and history of chronic UTIs with kidney infections, felt it was appropriate to go ahead and treat her clinically for pyelonephritis.  Started her on Omnicef.  Discussed return precautions.  Advised to follow-up with her PCP.  Vital stable discharge home with condition.  Patient management required discussion with the following services or consulting groups:  None  Complexity of Problems Addressed Acute complicated illness or Injury  Additional Data Reviewed and Analyzed Further history obtained from: Past medical history and medications listed in the EMR and Prior ED visit notes  Patient Encounter Risk Assessment Prescriptions         Final Clinical Impression(s) / ED Diagnoses Final diagnoses:  Dysuria  Lower abdominal pain    Rx / DC Orders ED  Discharge Orders          Ordered    cefdinir (OMNICEF) 300 MG capsule  2 times daily        02/10/23 2158              Gareth Eagle, PA-C 02/10/23 2210    Durwin Glaze, MD 02/11/23 [phone]

## 2023-02-14 ENCOUNTER — Encounter (HOSPITAL_BASED_OUTPATIENT_CLINIC_OR_DEPARTMENT_OTHER): Payer: Self-pay | Admitting: Cardiology

## 2023-02-14 ENCOUNTER — Ambulatory Visit (HOSPITAL_BASED_OUTPATIENT_CLINIC_OR_DEPARTMENT_OTHER): Payer: Medicare PPO | Admitting: Cardiology

## 2023-02-14 VITALS — BP 142/86 | HR 88 | Ht 64.0 in | Wt 188.0 lb

## 2023-02-14 DIAGNOSIS — I10 Essential (primary) hypertension: Secondary | ICD-10-CM

## 2023-02-14 DIAGNOSIS — R600 Localized edema: Secondary | ICD-10-CM | POA: Diagnosis not present

## 2023-02-14 DIAGNOSIS — Z8249 Family history of ischemic heart disease and other diseases of the circulatory system: Secondary | ICD-10-CM | POA: Diagnosis not present

## 2023-02-14 DIAGNOSIS — I872 Venous insufficiency (chronic) (peripheral): Secondary | ICD-10-CM | POA: Diagnosis not present

## 2023-02-14 DIAGNOSIS — I2510 Atherosclerotic heart disease of native coronary artery without angina pectoris: Secondary | ICD-10-CM

## 2023-02-14 DIAGNOSIS — E785 Hyperlipidemia, unspecified: Secondary | ICD-10-CM

## 2023-02-14 MED ORDER — HYDRALAZINE HCL 10 MG PO TABS: 10.0000 mg | ORAL_TABLET | Freq: Three times a day (TID) | ORAL | 3 refills | Status: DC | PRN

## 2023-02-14 NOTE — Patient Instructions (Signed)
Medication Instructions:  Your physician has recommended you make the following change in your medication:   START HYDRALAZINE 10MG :  Take 1 tablet (10 mg total) by mouth 3 (three) times daily as needed (for elevated blood pressure (top number more than 140, bottom number more than 90))  *If you need a refill on your cardiac medications before your next appointment, please call your pharmacy*   Follow-Up: At [facility], you and your health needs are our priority.  As part of our continuing mission to provide you with exceptional heart care, we have created designated Provider Care Teams.  These Care Teams include your primary Cardiologist (physician) and Advanced Practice Providers (APPs -  Physician Assistants and Nurse Practitioners) who all work together to provide you with the care you need, when you need it.  We recommend signing up for the patient portal called "MyChart".  Sign up information is provided on this After Visit Summary.  MyChart is used to connect with patients for Virtual Visits (Telemedicine).  Patients are able to view lab/test results, encounter notes, upcoming appointments, etc.  Non-urgent messages can be sent to your provider as well.   To learn more about what you can do with MyChart, go to [email or web address].    Your next appointment:   6 month(s)  Provider:   Jodelle Red, MD

## 2023-02-14 NOTE — Progress Notes (Signed)
Cardiology Office Note:  .   Date:  02/14/2023  ID:  Kayla [name], DOB [birth year], MRN [id number] PCP: Zola Button, Grayling Congress, DO   HeartCare Providers Cardiologist:  Jodelle Red, MD {  History of Present Illness: .   Kayla [name] is a 77 y.o. female with a hx of mild CAD, HTN, HLD, IBS, hypothyroid, GERD, fibromyalgia, OA who is seen for follow up today. I first met her on 06/01/18. Seen initially by Theodore Demark 03/26/18 for hypertension and chest discomfort.   Pertinent CV history: Echo 09/2020 EF 06-65%, G1DD, RV normal, no significant valve disease, Asc Ao 43 mm. 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 Monitor 05/2018 with rare brief SVT, symptomatic events were sinus. Myoview 2016 normal.  Today: Left axillary hematoma/knot after a blood draw. This has been a tough year for her with diverticulitis. Recent ER visit for UTI 11/4, started on omnicef. Has been having issues with intertrigo under her breasts and femoral folds. Went to ER for this 10/14, now on antifungal. Had leg swelling, pain, erythema for several days in 11/2022, DVT scan without evidence of clot. Has a lot of chronic leg pain, trying to establish with new neurologist and orthopedic surgeon.  She notes that she has been in significant pain, and with this her blood pressure has been significantly elevated.  ROS: Denies chest pain, shortness of breath at rest or with normal exertion. No PND, orthopnea, or unexpected weight gain. No syncope or palpitations. ROS otherwise negative except as noted.   Studies Reviewed: Marland Kitchen    EKG:       Physical Exam:   VS:  BP (!) 142/86 (BP Location: Left Arm, Patient Position: Sitting, Cuff Size: Normal)   Pulse 88   Ht 5\' 4"  (1.626 m)   Wt 188 lb (85.3 kg)   BMI 32.27 kg/m    Wt Readings from Last 3 Encounters:  02/14/23 188 lb (85.3 kg)  02/10/23 190 lb (86.2 kg)  11/27/22 201 lb 1 oz (91.2 kg)    GEN: Well nourished, well developed in no acute  distress HEENT: Normal, moist mucous membranes NECK: No JVD CARDIAC: regular rhythm, normal S1 and S2, no rubs or gallops. No murmur. VASCULAR: Radial and DP pulses 2+ bilaterally. No carotid bruits RESPIRATORY:  Clear to auscultation without rales, wheezing or rhonchi  ABDOMEN: Soft, non-tender, non-distended MUSCULOSKELETAL:  Ambulates independently SKIN: Warm and dry, mild chronic venous stasis changes with trivial LE edema NEUROLOGIC:  Alert and oriented x 3. Hard of hearing. PSYCHIATRIC:  Normal affect    ASSESSMENT AND PLAN: .    Bilateral LE edema: -much improved compared to prior -ultrasound negative for DVT -ABI/arterial evaluation normal -echo reassuring, normal filling pressures, normal EF, only minimal DD -very tender, does not tolerate anything touching her feet, unlikely to tolerate compression stockings.  -counseled on elevation -as this has improved, will avoid diuretics given prior profound effect on electrolytes   Hypertension: above goal today, but in pain. Under significant stress -she is tolerating amlodipine 5 mg daily with only trivial edema, would continue. Discussed moving up to 10 mg dose, she is concerned about side effects on a higher dose and would like to avoid this. -chlorthalidone previously discontinued given electrolyte abnormalities -continue to monitor -Goal is <130/80.  -she is very concerned re: history with prior kidneys. Will not take ACEi, ARB, MRA.  -would be cautious about beta blockers given fatigue -we discussed options today, including hydralazine. Discussed difference  between using as a PRN vs as a standing BID/TID dose. She is hesitant to take it as a standing dose. We will start with 10 mg TID PRN hydralazine.   Nonobstructive CAD Hypercholesterolemia -aspirin: takes intermittently -statin: has been on in the past, self-discontinued, declined restarting multiple times in the past. Most recent LDL 144. 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   CV risk counseling and prevention -recommend heart healthy/Mediterranean diet, with whole grains, fruits, vegetable, fish, lean meats, nuts, and olive oil. Limit salt. -recommend moderate walking, 3-5 times/week for 30-50 minutes each session. Aim for at least 150 minutes.week. Goal should be pace of 3 miles/hours, or walking 1.5 miles in 30 minutes -recommend avoidance of tobacco products. Avoid excess alcohol.  Dispo: 6 mos or sooner as needed  Signed, Jodelle Red, MD   Jodelle Red, MD, PhD, [facility]  [facility]    Heart & Vascular at [facility] at [facility] [address] [place], [place] [address] [phone]

## 2023-02-25 ENCOUNTER — Other Ambulatory Visit: Payer: Self-pay

## 2023-02-25 ENCOUNTER — Emergency Department (HOSPITAL_COMMUNITY)
Admission: EM | Admit: 2023-02-25 | Discharge: 2023-02-25 | Disposition: A | Discharge status: Home / Self Care | Payer: Medicare PPO | Attending: Emergency Medicine | Admitting: Emergency Medicine

## 2023-02-25 ENCOUNTER — Emergency Department (HOSPITAL_COMMUNITY): Payer: Medicare PPO

## 2023-02-25 ENCOUNTER — Ambulatory Visit: Payer: Medicare PPO | Admitting: Family Medicine

## 2023-02-25 ENCOUNTER — Encounter (HOSPITAL_COMMUNITY): Payer: Self-pay | Admitting: *Deleted

## 2023-02-25 DIAGNOSIS — S0083XA Contusion of other part of head, initial encounter: Secondary | ICD-10-CM | POA: Diagnosis not present

## 2023-02-25 DIAGNOSIS — W010XXA Fall on same level from slipping, tripping and stumbling without subsequent striking against object, initial encounter: Secondary | ICD-10-CM | POA: Insufficient documentation

## 2023-02-25 DIAGNOSIS — M542 Cervicalgia: Secondary | ICD-10-CM | POA: Diagnosis not present

## 2023-02-25 DIAGNOSIS — S0990XA Unspecified injury of head, initial encounter: Secondary | ICD-10-CM | POA: Diagnosis not present

## 2023-02-25 DIAGNOSIS — S199XXA Unspecified injury of neck, initial encounter: Secondary | ICD-10-CM | POA: Diagnosis not present

## 2023-02-25 DIAGNOSIS — R519 Headache, unspecified: Secondary | ICD-10-CM | POA: Diagnosis present

## 2023-02-25 DIAGNOSIS — M1711 Unilateral primary osteoarthritis, right knee: Secondary | ICD-10-CM | POA: Diagnosis not present

## 2023-02-25 DIAGNOSIS — W19XXXA Unspecified fall, initial encounter: Secondary | ICD-10-CM

## 2023-02-25 DIAGNOSIS — S0003XA Contusion of scalp, initial encounter: Secondary | ICD-10-CM | POA: Diagnosis not present

## 2023-02-25 DIAGNOSIS — Z043 Encounter for examination and observation following other accident: Secondary | ICD-10-CM | POA: Diagnosis not present

## 2023-02-25 DIAGNOSIS — R58 Hemorrhage, not elsewhere classified: Secondary | ICD-10-CM | POA: Diagnosis not present

## 2023-02-25 MED ORDER — ACETAMINOPHEN 325 MG PO TABS
650.0000 mg | ORAL_TABLET | Freq: Once | ORAL | Status: AC
  Administered 2023-02-25: 650 mg via ORAL
  Filled 2023-02-25: qty 2

## 2023-02-25 MED ORDER — KETOROLAC TROMETHAMINE 15 MG/ML IJ SOLN
15.0000 mg | Freq: Once | INTRAMUSCULAR | Status: AC
  Administered 2023-02-25: 15 mg via INTRAMUSCULAR
  Filled 2023-02-25: qty 1

## 2023-02-25 MED ORDER — ACETAMINOPHEN 325 MG PO TABS: 325.0000 mg | ORAL_TABLET | Freq: Once | ORAL | Status: DC

## 2023-02-25 NOTE — ED Notes (Signed)
AVS provided by edp was discussed with pt. Pt verbalized understanding with no additional questions at this time. Pt ambulatory to bedside wheelchair independently. Pt going home with grandson/granddaughter.

## 2023-02-25 NOTE — ED Provider Triage Note (Signed)
Emergency Medicine Provider Triage Evaluation Note  Kayla [name] , a 77 y.o. female  was evaluated in triage.  Pt complains of a fall.  She was trying to clean up some dog urine, and slipped on a puppy pad landing on her right knee and her face.  She she is not on thinners, no loss of consciousness.  Review of Systems  Positive:  Negative:   Physical Exam  BP (!) 147/84 (BP Location: Right Arm)   Pulse 75   Temp 97.9 F (36.6 C) (Oral)   Resp 18   SpO2 100%  Gen:   Awake, no distress   Resp:  Normal effort  MSK:   Moves extremities without difficulty  Other:  Alert, oriented.  No upper extremity numbness, weakness, lower lower extremity numbness or weakness.  Medical Decision Making  Medically screening exam initiated at 2:19 PM.  Appropriate orders placed.  Kayla [name] was informed that the remainder of the evaluation will be completed by another provider, this initial triage assessment does not replace that evaluation, and the importance of remaining in the ED until their evaluation is complete.  CT imaging of the head, neck, face ordered.  Right knee x-ray ordered.   Anders Simmonds T, DO 02/25/23 1421

## 2023-02-25 NOTE — ED Notes (Signed)
Pt remain in hallway 16. Updated in waiting on pending XR results. EDP updated on request for pain medication. Pt reports head pain 8/10. No nausea. No dizziness.

## 2023-02-25 NOTE — Discharge Instructions (Addendum)
You had a stable image of the CT brain and cervical spine demonstrating no fractures.  X-rays were stable.  Take Motrin or Tylenol at home for pain.  Please follow-up with your new primary care physician or the health and wellness team with body aches or pain recur after the fall.

## 2023-02-25 NOTE — ED Provider Notes (Signed)
[facility] EMERGENCY DEPARTMENT AT [facility] Provider Note   CSN: [id number] Arrival date & time: 02/25/23  1257     History  Chief Complaint  Patient presents with   Kayla [name] is a 77 y.o. female.  Patient is a 77 year old female with past medical history as listed below presenting for complaints of fall.  Patient states she slipped on her pants urine falling forward injuring her face and her knees.  Admits to pain in bilateral knees but worse on the right side.  Admits to facial trauma including abrasions, bony tenderness, and pain in her jaw.  She also admits to midline cervical spine tenderness and a headache.  She denies any sensation or motor dysfunction.  No blood thinner use.  No LOC.  The history is provided by the patient. No language interpreter was used.  Fall Associated symptoms include headaches. Pertinent negatives include no chest pain, no abdominal pain and no shortness of breath.       Home Medications Prior to Admission medications   Medication Sig Start Date End Date Taking? Authorizing Provider  acetaminophen (TYLENOL) 500 MG tablet Take 1,000 mg by mouth in the morning, at noon, in the evening, and at bedtime.    [provider]  amLODipine (NORVASC) 5 MG tablet Take 1 tablet (5 mg total) by mouth daily. 11/15/22   Jodelle Red, MD  bismuth subsalicylate (PEPTO BISMOL) 262 MG/15ML suspension Take 30 mLs by mouth every 6 (six) hours as needed for indigestion or diarrhea or loose stools.    [provider]  Cholecalciferol (VITAMIN D3) 1.25 MG (50000 UT) CAPS Take 1 capsule by mouth once a week. Sunday 07/22/22   [provider]  clotrimazole (LOTRIMIN) 1 % cream Apply to affected area 2 times daily 01/20/23   Gustavus Bryant, FNP  [employer] VITAMIN B12 1000 MCG tablet Take 2,000 mcg by mouth daily.    [provider]  hydrALAZINE (APRESOLINE) 10 MG tablet Take 1 tablet (10 mg total) by mouth 3 (three)  times daily as needed (for elevated blood pressure (top number more than 140, bottom number more than 90)). 02/14/23   Jodelle Red, MD  levothyroxine (SYNTHROID) 175 MCG tablet Take 175 mcg by mouth daily before breakfast.    [provider]      Allergies    Ace inhibitors and Lisinopril    Review of Systems   Review of Systems  Constitutional:  Negative for chills and fever.  HENT:  Negative for ear pain and sore throat.   Eyes:  Negative for pain and visual disturbance.  Respiratory:  Negative for cough and shortness of breath.   Cardiovascular:  Negative for chest pain and palpitations.  Gastrointestinal:  Negative for abdominal pain and vomiting.  Genitourinary:  Negative for dysuria and hematuria.  Musculoskeletal:  Negative for arthralgias and back pain.  Skin:  Positive for wound. Negative for color change and rash.  Neurological:  Positive for headaches. Negative for seizures and syncope.  All other systems reviewed and are negative.   Physical Exam Updated Vital Signs BP (!) 156/92 (BP Location: Left Arm)   Pulse 80   Temp 98.4 F (36.9 C) (Oral)   Resp 12   SpO2 99%  Physical Exam Vitals and nursing note reviewed.  Constitutional:      General: She is not in acute distress.    Appearance: She is well-developed.  HENT:     Head: Normocephalic.  Eyes:     General: Lids are normal. Vision grossly intact.     Conjunctiva/sclera: Conjunctivae normal.     Pupils: Pupils are equal, round, and reactive to light.  Cardiovascular:     Rate and Rhythm: Normal rate and regular rhythm.     Heart sounds: No murmur heard. Pulmonary:     Effort: Pulmonary effort is normal. No respiratory distress.     Breath sounds: Normal breath sounds.  Abdominal:     Palpations: Abdomen is soft.     Tenderness: There is no abdominal tenderness.  Musculoskeletal:        General: No swelling.     Cervical back: Neck supple. Bony tenderness present.     Thoracic  back: No bony tenderness.     Lumbar back: No bony tenderness.  Skin:    General: Skin is warm and dry.     Capillary Refill: Capillary refill takes less than 2 seconds.  Neurological:     Mental Status: She is alert.     GCS: GCS eye subscore is 4. GCS verbal subscore is 5. GCS motor subscore is 6.     Cranial Nerves: Cranial nerves 2-12 are intact.     Sensory: Sensation is intact.     Motor: Motor function is intact.     Coordination: Coordination is intact.  Psychiatric:        Mood and Affect: Mood normal.     ED Results / Procedures / Treatments   Labs (all labs ordered are listed, but only abnormal results are displayed) Labs Reviewed - No data to display  EKG None  Radiology CT Head Wo Contrast  Result Date: 02/25/2023 CLINICAL DATA:  Ataxia, head trauma; Neck trauma, dangerous injury mechanism. Fall in garage. Forehead hematoma and nasal abrasion. Neck and left shoulder pain. Jaw pain. EXAM: CT HEAD WITHOUT CONTRAST CT MAXILLOFACIAL WITHOUT CONTRAST CT CERVICAL SPINE WITHOUT CONTRAST TECHNIQUE: Multidetector CT imaging of the head, cervical spine, and maxillofacial structures were performed using the standard protocol without intravenous contrast. Multiplanar CT image reconstructions of the cervical spine and maxillofacial structures were also generated. RADIATION DOSE REDUCTION: This exam was performed according to the departmental dose-optimization program which includes automated exposure control, adjustment of the mA and/or kV according to patient size and/or use of iterative reconstruction technique. COMPARISON:  Head CT 06/16/2021 FINDINGS: CT HEAD FINDINGS Brain: There is no evidence of an acute infarct, intracranial hemorrhage, mass, midline shift, or extra-axial fluid collection. Mild cerebral atrophy is within normal limits for age. Cerebral white matter hypodensities are similar to the prior study and are nonspecific but compatible with mild chronic small vessel  ischemic disease. Vascular: Calcified atherosclerosis at the skull base. No hyperdense vessel. Skull: No acute fracture or suspicious osseous lesion. Other: Small frontal scalp hematoma. CT MAXILLOFACIAL FINDINGS Osseous: No acute fracture or mandibular dislocation. Multiple dental caries and periapical dental lucencies. Orbits: Unremarkable. Sinuses: Clear paranasal sinuses.  Trace bilateral mastoid fluid. Soft tissues: Mild nasal soft tissue swelling. CT CERVICAL SPINE FINDINGS Alignment: Cervical spine straightening.  No significant listhesis. Skull base and vertebrae: No acute fracture or destructive bone lesion. Soft tissues and spinal canal: No prevertebral fluid or swelling. No visible canal hematoma. Disc levels: C6-7 ACDF with solid arthrodesis. Interbody and facet ankylosis at C3-4. Advanced right facet arthrosis at C2-3, C4-5, and C5-6. Moderate right neural foraminal stenosis at C4-5 due to uncovertebral and facet spurring. No evidence of high-grade spinal canal stenosis. Upper chest: Clear lung apices. Other: None.  IMPRESSION: 1. No evidence of acute intracranial abnormality. 2. No acute maxillofacial or cervical spine fracture. 3. Frontal scalp hematoma. Electronically Signed   By: Sebastian Ache M.D.   On: 02/25/2023 17:33   CT Cervical Spine Wo Contrast  Result Date: 02/25/2023 CLINICAL DATA:  Ataxia, head trauma; Neck trauma, dangerous injury mechanism. Fall in garage. Forehead hematoma and nasal abrasion. Neck and left shoulder pain. Jaw pain. EXAM: CT HEAD WITHOUT CONTRAST CT MAXILLOFACIAL WITHOUT CONTRAST CT CERVICAL SPINE WITHOUT CONTRAST TECHNIQUE: Multidetector CT imaging of the head, cervical spine, and maxillofacial structures were performed using the standard protocol without intravenous contrast. Multiplanar CT image reconstructions of the cervical spine and maxillofacial structures were also generated. RADIATION DOSE REDUCTION: This exam was performed according to the departmental  dose-optimization program which includes automated exposure control, adjustment of the mA and/or kV according to patient size and/or use of iterative reconstruction technique. COMPARISON:  Head CT 06/16/2021 FINDINGS: CT HEAD FINDINGS Brain: There is no evidence of an acute infarct, intracranial hemorrhage, mass, midline shift, or extra-axial fluid collection. Mild cerebral atrophy is within normal limits for age. Cerebral white matter hypodensities are similar to the prior study and are nonspecific but compatible with mild chronic small vessel ischemic disease. Vascular: Calcified atherosclerosis at the skull base. No hyperdense vessel. Skull: No acute fracture or suspicious osseous lesion. Other: Small frontal scalp hematoma. CT MAXILLOFACIAL FINDINGS Osseous: No acute fracture or mandibular dislocation. Multiple dental caries and periapical dental lucencies. Orbits: Unremarkable. Sinuses: Clear paranasal sinuses.  Trace bilateral mastoid fluid. Soft tissues: Mild nasal soft tissue swelling. CT CERVICAL SPINE FINDINGS Alignment: Cervical spine straightening.  No significant listhesis. Skull base and vertebrae: No acute fracture or destructive bone lesion. Soft tissues and spinal canal: No prevertebral fluid or swelling. No visible canal hematoma. Disc levels: C6-7 ACDF with solid arthrodesis. Interbody and facet ankylosis at C3-4. Advanced right facet arthrosis at C2-3, C4-5, and C5-6. Moderate right neural foraminal stenosis at C4-5 due to uncovertebral and facet spurring. No evidence of high-grade spinal canal stenosis. Upper chest: Clear lung apices. Other: None. IMPRESSION: 1. No evidence of acute intracranial abnormality. 2. No acute maxillofacial or cervical spine fracture. 3. Frontal scalp hematoma. Electronically Signed   By: Sebastian Ache M.D.   On: 02/25/2023 17:33   CT Maxillofacial Wo Contrast  Result Date: 02/25/2023 CLINICAL DATA:  Ataxia, head trauma; Neck trauma, dangerous injury mechanism.  Fall in garage. Forehead hematoma and nasal abrasion. Neck and left shoulder pain. Jaw pain. EXAM: CT HEAD WITHOUT CONTRAST CT MAXILLOFACIAL WITHOUT CONTRAST CT CERVICAL SPINE WITHOUT CONTRAST TECHNIQUE: Multidetector CT imaging of the head, cervical spine, and maxillofacial structures were performed using the standard protocol without intravenous contrast. Multiplanar CT image reconstructions of the cervical spine and maxillofacial structures were also generated. RADIATION DOSE REDUCTION: This exam was performed according to the departmental dose-optimization program which includes automated exposure control, adjustment of the mA and/or kV according to patient size and/or use of iterative reconstruction technique. COMPARISON:  Head CT 06/16/2021 FINDINGS: CT HEAD FINDINGS Brain: There is no evidence of an acute infarct, intracranial hemorrhage, mass, midline shift, or extra-axial fluid collection. Mild cerebral atrophy is within normal limits for age. Cerebral white matter hypodensities are similar to the prior study and are nonspecific but compatible with mild chronic small vessel ischemic disease. Vascular: Calcified atherosclerosis at the skull base. No hyperdense vessel. Skull: No acute fracture or suspicious osseous lesion. Other: Small frontal scalp hematoma. CT MAXILLOFACIAL FINDINGS Osseous: No acute fracture or  mandibular dislocation. Multiple dental caries and periapical dental lucencies. Orbits: Unremarkable. Sinuses: Clear paranasal sinuses.  Trace bilateral mastoid fluid. Soft tissues: Mild nasal soft tissue swelling. CT CERVICAL SPINE FINDINGS Alignment: Cervical spine straightening.  No significant listhesis. Skull base and vertebrae: No acute fracture or destructive bone lesion. Soft tissues and spinal canal: No prevertebral fluid or swelling. No visible canal hematoma. Disc levels: C6-7 ACDF with solid arthrodesis. Interbody and facet ankylosis at C3-4. Advanced right facet arthrosis at C2-3, C4-5,  and C5-6. Moderate right neural foraminal stenosis at C4-5 due to uncovertebral and facet spurring. No evidence of high-grade spinal canal stenosis. Upper chest: Clear lung apices. Other: None. IMPRESSION: 1. No evidence of acute intracranial abnormality. 2. No acute maxillofacial or cervical spine fracture. 3. Frontal scalp hematoma. Electronically Signed   By: Sebastian Ache M.D.   On: 02/25/2023 17:33    Procedures Procedures    Medications Ordered in ED Medications  acetaminophen (TYLENOL) tablet 650 mg (650 mg Oral Given 02/25/23 1723)  ketorolac (TORADOL) 15 MG/ML injection 15 mg (15 mg Intramuscular Given 02/25/23 1925)    ED Course/ Medical Decision Making/ A&P                                 Medical Decision Making Risk OTC drugs. Prescription drug management.   36:33 PM 77 year old female with past medical history as listed below presenting for complaints of fall.  Is alert and oriented x 3, no acute distress, afebrile, stable vital signs.  Physical exam demonstrates no neurovascular deficits.  Some superficial abrasions to the face.  No deep lacerations or need for tetanus seen at this time.  Some cervical spinal tenderness but no gross deformities on exam.  Knee pain without any gross deformities.  No redness, swelling, abrasions, sensation or motor deficits.  Normal range of motion.    CT head and neck demonstrates no acute process.  X-ray stable.  Patient remains neurovascularly intact and able to ambulate without difficulty.  Patient in no distress and overall condition improved here in the ED. Detailed discussions were had with the patient regarding current findings, and need for close f/u with PCP or on call doctor. The patient has been instructed to return immediately if the symptoms worsen in any way for re-evaluation. Patient verbalized understanding and is in agreement with current care plan. All questions answered prior to discharge.         Final Clinical  Impression(s) / ED Diagnoses Final diagnoses:  Contusion of face, initial encounter  Fall, initial encounter    Rx / DC Orders ED Discharge Orders     None         Franne Forts, DO 02/25/23 1945

## 2023-02-25 NOTE — ED Triage Notes (Addendum)
Pt brought in by [facility] after falling in garage and states her feet fell out from under her. Pt states her left ankle twisted, then landed on right knee and face. No LOC, but felt like she was going to pass out after falling.  NO thinners. PT has hematoma to middle forehead with a small abrasion at bridge of nose.  PT is in c-collar and reports neck pain and left shoulder pain and states left jaw may have been hurting. 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 Pt alert and oriented, speech.  PT was recently in hospital for kidney infection about 2 weeks ago.

## 2023-03-17 ENCOUNTER — Ambulatory Visit
Admission: EM | Admit: 2023-03-17 | Discharge: 2023-03-17 | Disposition: A | Discharge status: Home / Self Care | Payer: Medicare PPO | Attending: Family Medicine | Admitting: Family Medicine

## 2023-03-17 ENCOUNTER — Other Ambulatory Visit: Payer: Self-pay

## 2023-03-17 ENCOUNTER — Encounter: Payer: Self-pay | Admitting: *Deleted

## 2023-03-17 DIAGNOSIS — N390 Urinary tract infection, site not specified: Secondary | ICD-10-CM | POA: Diagnosis not present

## 2023-03-17 LAB — POCT URINALYSIS DIP (MANUAL ENTRY)
Glucose, UA: NEGATIVE mg/dL
Nitrite, UA: NEGATIVE
Protein Ur, POC: >=300 mg/dL — AB
Spec Grav, UA: >=1.03 — AB (ref 1.010–1.025)
Urobilinogen, UA: 1 U/dL
pH, UA: 6 (ref 5.0–8.0)

## 2023-03-17 MED ORDER — CEPHALEXIN 500 MG PO CAPS: 500.0000 mg | ORAL_CAPSULE | Freq: Two times a day (BID) | ORAL | 0 refills | Status: DC

## 2023-03-17 NOTE — ED Triage Notes (Signed)
Pt reports dysuria since yesterday. She took a home UTI test which was positive. States she is in extreme pain. Pt access stated pt told she was going to pass out. In triage pt states she requested a wheelchair upon arrival b/c she "was going to fall". She had recent ED visit for a fall

## 2023-03-17 NOTE — Discharge Instructions (Signed)
We are treating you for urinary tract infection.  Start cephalexin twice daily for 7 days.  We will contact you if we need to change your treatment based on your labs and urine culture.  Make sure you drink plenty of fluid.  You should have your urine repeated the next time you go to your primary care to ensure that the small amount of blood noted today goes away.  If anything worsens you have severe abdominal pain, blood in your urine, fever, nausea, vomiting you need to be seen immediately.

## 2023-03-17 NOTE — ED Provider Notes (Signed)
[name]    CSN: [id number] Arrival date & time: 03/17/23  1022      History   Chief Complaint Chief Complaint  Patient presents with   Dysuria    HPI Kayla [name] is a 77 y.o. female.   Patient presents today with a 24-hour history of UTI symptoms.  She reports lower abdominal pain dysuria, urinary frequency, urinary urgency.  She does report ongoing back pain but states that this is chronic related to chronic lower back pain.  She denies any fever, nausea, vomiting, hematuria.  She has taken at home UTI test that was positive.  She does have a history of recurrent UTI but has never seen urology.  She was treated for UTI about a month ago with Omnicef.  Denies any antibiotics in the past 90 days.  Denies any recent urogenital procedure, catheterization, history of single kidney.  She denies history of diabetes and does not take SGLT2 inhibitor.  She reports lower abdominal pain is rated 9 on a certain pain scale, described as sharp, worse with micturition, no alleviating factors identified.    Past Medical History:  Diagnosis Date   Anemia    Anxiety    Asthma    B12 deficiency    Bursitis    Chronic LBP    Chronic UTI    Diastolic dysfunction    Fibromyalgia    GERD (gastroesophageal reflux disease)    Hearing impaired person, bilateral    per pt   History of low potassium    Hyperlipemia    Hypertension    Hypothyroid    IBS (irritable bowel syndrome)    Inflammatory osteoarthritis    Kidney infection    Psoriatic arthritis (HCC)    S/P cardiac cath 11/30/2007   normal coronaries - Dr. Excell Seltzer (Dr. Rennis Golden reviewed films on 01/10/2016)    Patient Active Problem List   Diagnosis Date Noted   Personal history of COVID-19 08/24/2020   Body mass index (BMI) 33.0-33.9, adult 06/20/2020   Status post lumbar microdiscectomy 06/20/2020   Essential (primary) hypertension 06/20/2020   Bilateral leg edema 06/17/2020   Cellulitis of leg, left 06/17/2020    Cellulitis of leg, right 06/17/2020   Lumbar herniated disc 05/25/2020   Hyponatremia 05/12/2020   Sciatica 05/12/2020   COVID-19 05/09/2020   Hypokalemia 05/04/2020   Dysphagia 04/17/2020   Renal insufficiency 12/29/2018   Cyst of left ovary 12/29/2018   Esophageal dysmotility 12/29/2018   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/29/2018   Acquired hypothyroidism 12/29/2018   Chronic maxillary sinusitis 07/07/2018   Snoring 06/01/2018   Costochondritis 06/01/2018   Family history of heart disease 06/01/2018   Class 2 severe obesity due to excess calories with serious comorbidity and body mass index (BMI) of 38.0 to 38.9 in adult [facility]) 06/01/2018   Multiple atypical skin moles 04/28/2017   Depression, recurrent (HCC) 07/16/2016   Adjustment disorder with mixed anxiety and depressed mood 01/15/2016   Fe Def Anemia 12/22/2015   Idiopathic scoliosis and kyphoscoliosis 12/20/2015   Osteopenia 12/20/2015   Fibromyalgia 08/02/2014   GAD (generalized anxiety disorder) 09/17/2012   Vitamin D deficiency 01/24/2012   Fatigue 10/14/2011   B12 deficiency    Hyperlipidemia 11/30/2007   Coronary artery disease involving native heart without angina pectoris 11/30/2007   Essential hypertension 01/27/2007   GERD 01/27/2007   IBS 01/27/2007    Past Surgical History:  Procedure Laterality Date   CESAREAN SECTION  06/10/1975   CHOLECYSTECTOMY  LUMBAR LAMINECTOMY/DECOMPRESSION MICRODISCECTOMY Right 05/25/2020   Procedure: MICRODISCECTOMY Right Lumbar three - four;  Surgeon: Tressie Stalker, MD;  Location: [facility] OR;  Service: Neurosurgery;  Laterality: Right;   NECK SURGERY  1998   RADIOLOGY WITH ANESTHESIA N/A 05/16/2020   Procedure: MRI LUMBAR SPINE W/CONTRAST;  Surgeon: Radiologist, Medication, MD;  Location: [facility] OR;  Service: Radiology;  Laterality: N/A;   repair broken C6 & C7      OB History   No obstetric history on file.      Home Medications    Prior to Admission  medications   Medication Sig Start Date End Date Taking? Authorizing Provider  acetaminophen (TYLENOL) 500 MG tablet Take 1,000 mg by mouth in the morning, at noon, in the evening, and at bedtime.   Yes [provider]  amLODipine (NORVASC) 5 MG tablet Take 1 tablet (5 mg total) by mouth daily. 11/15/22  Yes Jodelle Red, MD  cephALEXin (KEFLEX) 500 MG capsule Take 1 capsule (500 mg total) by mouth 2 (two) times daily. 03/17/23  Yes Shunte Senseney K, PA-C  [employer] VITAMIN B12 1000 MCG tablet Take 2,000 mcg by mouth daily.   Yes [provider]  hydrALAZINE (APRESOLINE) 10 MG tablet Take 1 tablet (10 mg total) by mouth 3 (three) times daily as needed (for elevated blood pressure (top number more than 140, bottom number more than 90)). 02/14/23  Yes Jodelle Red, MD  levothyroxine (SYNTHROID) 175 MCG tablet Take 175 mcg by mouth daily before breakfast.   Yes [provider]  bismuth subsalicylate (PEPTO BISMOL) 262 MG/15ML suspension Take 30 mLs by mouth every 6 (six) hours as needed for indigestion or diarrhea or loose stools.    [provider]  Cholecalciferol (VITAMIN D3) 1.25 MG (50000 UT) CAPS Take 1 capsule by mouth once a week. Sunday 07/22/22   [provider]  clotrimazole (LOTRIMIN) 1 % cream Apply to affected area 2 times daily 01/20/23   Gustavus Bryant, FNP    Family History Family History  Problem Relation Age of Onset   Heart attack Father 27   Lung cancer Other        uncle, non smoker   Leukemia Other        uncle   Cancer Mother        oral   Stroke Mother    Alzheimer's disease Mother        at 36   Cancer Maternal Aunt        breast   Alcohol abuse Maternal Aunt    Alcohol abuse Maternal Uncle    Cancer Maternal Uncle        lung, stomach, oral   Cancer Paternal Uncle        lung   Congestive Heart Failure Maternal Grandfather    Heart disease Maternal Grandfather    Heart attack Paternal Grandfather     Cancer Paternal Uncle        bone   Brain cancer Maternal Grandmother    Alzheimer's disease Other        maternal great grandmother    Colon cancer Neg Hx    Breast cancer Neg Hx    Diabetes Neg Hx     Social History Social History   Tobacco Use   Smoking status: Never    Passive exposure: Never   Smokeless tobacco: Never  Vaping Use   Vaping status: Never Used  Substance Use Topics   Alcohol use: Never   Drug use:  No     Allergies   Ace inhibitors and Lisinopril   Review of Systems Review of Systems  Constitutional:  Positive for activity change. Negative for appetite change, fatigue and fever.  Gastrointestinal:  Positive for abdominal pain. Negative for diarrhea, nausea and vomiting.  Genitourinary:  Positive for dysuria, frequency and urgency. Negative for vaginal bleeding, vaginal discharge and vaginal pain.  Musculoskeletal:  Positive for back pain. Negative for arthralgias and myalgias.     Physical Exam Triage Vital Signs ED Triage Vitals  Encounter Vitals Group     BP 03/17/23 1035 (!) 157/93     Systolic BP Percentile --      Diastolic BP Percentile --      Pulse Rate 03/17/23 1035 99     Resp 03/17/23 1035 18     Temp 03/17/23 1035 97.8 F (36.6 C)     Temp Source 03/17/23 1035 Oral     SpO2 03/17/23 1035 96 %     Weight --      Height --      Head Circumference --      Peak Flow --      Pain Score 03/17/23 1031 9     Pain Loc --      Pain Education --      Exclude from Growth Chart --    No data found.  Updated Vital Signs BP (!) 157/93 (BP Location: Right Arm)   Pulse 99   Temp 97.8 F (36.6 C) (Oral)   Resp 18   SpO2 96%   Visual Acuity Right Eye Distance:   Left Eye Distance:   Bilateral Distance:    Right Eye Near:   Left Eye Near:    Bilateral Near:     Physical Exam Vitals reviewed.  Constitutional:      General: She is awake. She is not in acute distress.    Appearance: Normal appearance. She is well-developed. She  is not ill-appearing.     Comments: Very pleasant female appears stated age in no acute distress sitting comfortable in exam room  HENT:     Head: Normocephalic and atraumatic.  Cardiovascular:     Rate and Rhythm: Normal rate and regular rhythm.     Heart sounds: Normal heart sounds, S1 normal and S2 normal. No murmur heard. Pulmonary:     Effort: Pulmonary effort is normal.     Breath sounds: Normal breath sounds. No wheezing, rhonchi or rales.     Comments: Clear to auscultation bilaterally Abdominal:     General: Bowel sounds are normal.     Palpations: Abdomen is soft.     Tenderness: There is abdominal tenderness in the suprapubic area. There is no right CVA tenderness, left CVA tenderness, guarding or rebound.  Psychiatric:        Behavior: Behavior is cooperative.      UC Treatments / Results  Labs (all labs ordered are listed, but only abnormal results are displayed) Labs Reviewed  POCT URINALYSIS DIP (MANUAL ENTRY) - Abnormal; Notable for the following components:      Result Value   Clarity, UA cloudy (*)    Bilirubin, UA small (*)    Ketones, POC UA trace (5) (*)    Spec Grav, UA >=1.030 (*)    Blood, UA large (*)    Protein Ur, POC >=300 (*)    Leukocytes, UA Trace (*)    All other components within normal limits  URINE CULTURE  CBC WITH DIFFERENTIAL/PLATELET  BASIC METABOLIC PANEL    EKG   Radiology No results found.  Procedures Procedures (including critical care time)  Medications Ordered in UC Medications - No data to display  Initial Impression / Assessment and Plan / UC Course  I have reviewed the triage vital signs and the nursing notes.  Pertinent labs & imaging results that were available during my care of the patient were reviewed by me and considered in my medical decision making (see chart for details).     Patient is well-appearing, afebrile, nontoxic, nontachycardic.  UA concerning for UTI.  Will empirically treat given abnormal UA  with acute symptoms.  She was started on cephalexin twice daily for 7 days.  Will send this for culture and contact her if we need to discontinue or change her antibiotics based on her culture result.  No indication for dose adjustment based on her metabolic panel from 02/10/2023 with creatinine of 1.03 and calculated creatinine clearance of 61.58 mL/min.  Basic labs including CBC and BMP were obtained today and are pending.  We will contact her if these are abnormal and change our treatment plan.  She was encouraged to push fluids and can use over-the-counter medications such as Tylenol for symptom relief.  She is currently between primary care providers but does have an appointment with someone new in late January early February 2025.  We discussed that given the blood noted today on her UA she should have a repeat urine to ensure that this resolves when she sees her primary care.  We also discussed send utility of seeing a urologist but reports that she has been unable to schedule an appointment with anyone as she needs a primary care referral and is in the process of establishing with a new PCP.  We discussed that if she has any worsening or changing symptoms including worsening pain, fever, hematuria, nausea, vomiting she would need to go to the emergency room to which she expressed understanding.  Strict return precautions given.  Final Clinical Impressions(s) / UC Diagnoses   Final diagnoses:  Acute UTI     Discharge Instructions      We are treating you for urinary tract infection.  Start cephalexin twice daily for 7 days.  We will contact you if we need to change your treatment based on your labs and urine culture.  Make sure you drink plenty of fluid.  You should have your urine repeated the next time you go to your primary care to ensure that the small amount of blood noted today goes away.  If anything worsens you have severe abdominal pain, blood in your urine, fever, nausea, vomiting you  need to be seen immediately.     ED Prescriptions     Medication Sig Dispense Auth. Provider   cephALEXin (KEFLEX) 500 MG capsule Take 1 capsule (500 mg total) by mouth 2 (two) times daily. 14 capsule Offie Waide K, PA-C      PDMP not reviewed this encounter.   Jeani Hawking, PA-C 03/17/23 1217

## 2023-03-18 LAB — CBC WITH DIFFERENTIAL/PLATELET
Basophils Absolute: 0.1 10*3/uL (ref 0.0–0.2)
Basos: 1 %
EOS (ABSOLUTE): 0.2 10*3/uL (ref 0.0–0.4)
Eos: 1 %
Hematocrit: 46.8 % — ABNORMAL HIGH (ref 34.0–46.6)
Hemoglobin: 15.4 g/dL (ref 11.1–15.9)
Immature Grans (Abs): 0 10*3/uL (ref 0.0–0.1)
Immature Granulocytes: 0 %
Lymphocytes Absolute: 3 10*3/uL (ref 0.7–3.1)
Lymphs: 24 %
MCH: 31.5 pg (ref 26.6–33.0)
MCHC: 32.9 g/dL (ref 31.5–35.7)
MCV: 96 fL (ref 79–97)
Monocytes Absolute: 1 10*3/uL — ABNORMAL HIGH (ref 0.1–0.9)
Monocytes: 8 %
Neutrophils Absolute: 8.1 10*3/uL — ABNORMAL HIGH (ref 1.4–7.0)
Neutrophils: 66 %
Platelets: 306 10*3/uL (ref 150–450)
RBC: 4.89 x10E6/uL (ref 3.77–5.28)
RDW: 12.9 % (ref 11.7–15.4)
WBC: 12.3 10*3/uL — ABNORMAL HIGH (ref 3.4–10.8)

## 2023-03-18 LAB — BASIC METABOLIC PANEL
BUN/Creatinine Ratio: 15 (ref 12–28)
BUN: 13 mg/dL (ref 8–27)
CO2: 18 mmol/L — ABNORMAL LOW (ref 20–29)
Calcium: 9.6 mg/dL (ref 8.7–10.3)
Chloride: 109 mmol/L — ABNORMAL HIGH (ref 96–106)
Creatinine, Ser: 0.84 mg/dL (ref 0.57–1.00)
Glucose: 92 mg/dL (ref 70–99)
Potassium: 3.9 mmol/L (ref 3.5–5.2)
Sodium: 145 mmol/L — ABNORMAL HIGH (ref 134–144)
eGFR: 72 mL/min/{1.73_m2} (ref >59)

## 2023-03-18 LAB — URINE CULTURE
Culture: <10000 — AB
Special Requests: NORMAL

## 2023-03-25 ENCOUNTER — Emergency Department (HOSPITAL_COMMUNITY): Payer: Medicare PPO

## 2023-03-25 ENCOUNTER — Emergency Department (HOSPITAL_COMMUNITY)
Admission: EM | Admit: 2023-03-25 | Discharge: 2023-03-25 | Disposition: A | Discharge status: Home / Self Care | Payer: Medicare PPO | Attending: Emergency Medicine | Admitting: Emergency Medicine

## 2023-03-25 DIAGNOSIS — I2510 Atherosclerotic heart disease of native coronary artery without angina pectoris: Secondary | ICD-10-CM | POA: Insufficient documentation

## 2023-03-25 DIAGNOSIS — R079 Chest pain, unspecified: Secondary | ICD-10-CM | POA: Diagnosis not present

## 2023-03-25 DIAGNOSIS — R0689 Other abnormalities of breathing: Secondary | ICD-10-CM | POA: Diagnosis not present

## 2023-03-25 DIAGNOSIS — R0789 Other chest pain: Secondary | ICD-10-CM | POA: Diagnosis not present

## 2023-03-25 DIAGNOSIS — R519 Headache, unspecified: Secondary | ICD-10-CM | POA: Diagnosis present

## 2023-03-25 DIAGNOSIS — I10 Essential (primary) hypertension: Secondary | ICD-10-CM | POA: Insufficient documentation

## 2023-03-25 DIAGNOSIS — M797 Fibromyalgia: Secondary | ICD-10-CM | POA: Insufficient documentation

## 2023-03-25 DIAGNOSIS — Z79899 Other long term (current) drug therapy: Secondary | ICD-10-CM | POA: Diagnosis not present

## 2023-03-25 LAB — CBC
HCT: 44.4 % (ref 36.0–46.0)
Hemoglobin: 14.2 g/dL (ref 12.0–15.0)
MCH: 30.9 pg (ref 26.0–34.0)
MCHC: 32 g/dL (ref 30.0–36.0)
MCV: 96.7 fL (ref 80.0–100.0)
Platelets: 309 10*3/uL (ref 150–400)
RBC: 4.59 MIL/uL (ref 3.87–5.11)
RDW: 12.7 % (ref 11.5–15.5)
WBC: 8.4 10*3/uL (ref 4.0–10.5)
nRBC: 0 % (ref 0.0–0.2)

## 2023-03-25 LAB — BASIC METABOLIC PANEL
Anion gap: 3 — ABNORMAL LOW (ref 5–15)
BUN: 13 mg/dL (ref 8–23)
CO2: 26 mmol/L (ref 22–32)
Calcium: 9.5 mg/dL (ref 8.9–10.3)
Chloride: 111 mmol/L (ref 98–111)
Creatinine, Ser: 0.86 mg/dL (ref 0.44–1.00)
GFR, Estimated: >60 mL/min (ref >60)
Glucose, Bld: 102 mg/dL — ABNORMAL HIGH (ref 70–99)
Potassium: 3.8 mmol/L (ref 3.5–5.1)
Sodium: 140 mmol/L (ref 135–145)

## 2023-03-25 LAB — TROPONIN I (HIGH SENSITIVITY)
Troponin I (High Sensitivity): 6 ng/L (ref <18)
Troponin I (High Sensitivity): 7 ng/L (ref <18)

## 2023-03-25 NOTE — ED Triage Notes (Signed)
Pt BIB [facility]  from home started this morning on L side chest radiating to the jaw and shoulder. Hx HTN. NO N/V.   324 ASA 1 nitroglycerin no improvements.   BP 160/100 HR 98

## 2023-03-25 NOTE — Discharge Instructions (Addendum)
You were seen today for chest pain in the ER.  This is not likely due to emergent cardiac reason as all of your labs were within normal limits including the one evaluating your heart.  Your EKG was also normal without any signs of ischemia.  I would recommend that you follow-up with your primary care in February or sooner if available.  I also recommend that you follow-up with your cardiologist for this intermittent chest pain that you have been experiencing to possibly get further workup.  I have also put in referral to neurology due to your PCP being unable to do so until February.  Return to ER for further evaluation of your experiencing worsening chest pain, shortness of breath, dizziness, abdominal pain, worsening fatigue, nausea, vomiting.

## 2023-03-25 NOTE — ED Provider Triage Note (Signed)
Emergency Medicine Provider Triage Evaluation Note  CREOLA MCLAMB , a 77 y.o. female  was evaluated in triage.  Pt complains of chest pain below her breast that radiated to her jaw/neck. Currently pain has subsided. Family hx of CAD.  Review of Systems  Positive: Chest pain Negative: Shortness of breath  Physical Exam  BP (!) 143/81 (BP Location: Right Arm)   Pulse 89   Temp 98 F (36.7 C)   Resp 16   SpO2 98%  Gen:   Awake, no distress   Resp:  Normal effort  MSK:   Moves extremities without difficulty  Other:    Medical Decision Making  Medically screening exam initiated at 10:24 AM.  Appropriate orders placed.  Geraldo Pitter was informed that the remainder of the evaluation will be completed by another provider, this initial triage assessment does not replace that evaluation, and the importance of remaining in the ED until their evaluation is complete.  Chest pain workup initiated. Initial EKG is reassuring. Advised that patient's acuity be made level 2 and she needs to be placed in a bed as soon as a room is made available. If the troponin is elevated, will have to change her to acuity 1.   Derwood Kaplan, MD 03/25/23 1026

## 2023-03-25 NOTE — ED Provider Notes (Signed)
[place] EMERGENCY DEPARTMENT AT [facility] Provider Note   CSN: [id number] Arrival date & time: 03/25/23  1914     History  Chief Complaint  Patient presents with   Chest Pain    Kayla [name] is a 77 y.o. female.   Chest Pain Patient presents to ED with intermittent chest pain starting this morning.  Extensive family history of cardiac problems.  Previous medical history of hypertension, CAD without angina, GAD, fibromyalgia, osteopenia, Fe Def anemia, adjustment disorder, depression.  States the pain started in her lower chest and ascended up her arm and into her left side of her face.  This is new for her.  Previously seen on 03/17/2023 for UTI and prescribed Keflex.      Home Medications Prior to Admission medications   Medication Sig Start Date End Date Taking? Authorizing Provider  acetaminophen (TYLENOL) 500 MG tablet Take 1,000 mg by mouth in the morning, at noon, in the evening, and at bedtime.    [provider]  amLODipine (NORVASC) 5 MG tablet Take 1 tablet (5 mg total) by mouth daily. 11/15/22   Jodelle Red, MD  bismuth subsalicylate (PEPTO BISMOL) 262 MG/15ML suspension Take 30 mLs by mouth every 6 (six) hours as needed for indigestion or diarrhea or loose stools.    [provider]  cephALEXin (KEFLEX) 500 MG capsule Take 1 capsule (500 mg total) by mouth 2 (two) times daily. 03/17/23   Raspet, Noberto Retort, PA-C  Cholecalciferol (VITAMIN D3) 1.25 MG (50000 UT) CAPS Take 1 capsule by mouth once a week. Sunday 07/22/22   [provider]  clotrimazole (LOTRIMIN) 1 % cream Apply to affected area 2 times daily 01/20/23   Gustavus Bryant, FNP  [employer] VITAMIN B12 1000 MCG tablet Take 2,000 mcg by mouth daily.    [provider]  hydrALAZINE (APRESOLINE) 10 MG tablet Take 1 tablet (10 mg total) by mouth 3 (three) times daily as needed (for elevated blood pressure (top number more than 140, bottom number more than 90)). 02/14/23    Jodelle Red, MD  levothyroxine (SYNTHROID) 175 MCG tablet Take 175 mcg by mouth daily before breakfast.    [provider]      Allergies    Ace inhibitors and Lisinopril    Review of Systems   Review of Systems  Cardiovascular:  Positive for chest pain.  All other systems reviewed and are negative.   Physical Exam Updated Vital Signs BP (!) 152/101   Pulse 73   Temp 98 F (36.7 C)   Resp 16   SpO2 95%  Physical Exam Vitals and nursing note reviewed.  Constitutional:      General: She is not in acute distress.    Appearance: Normal appearance. She is not ill-appearing, toxic-appearing or diaphoretic.  HENT:     Head: Normocephalic and atraumatic.     Right Ear: External ear normal.     Left Ear: External ear normal.     Nose: No congestion or rhinorrhea.  Eyes:     General: No scleral icterus.       Right eye: No discharge.        Left eye: No discharge.     Extraocular Movements: Extraocular movements intact.     Conjunctiva/sclera: Conjunctivae normal.  Cardiovascular:     Rate and Rhythm: Normal rate and regular rhythm.     Pulses: Normal pulses.     Heart sounds: Normal heart sounds. No murmur heard.  No friction rub. No gallop.  Pulmonary:     Effort: Pulmonary effort is normal. No respiratory distress.     Breath sounds: Normal breath sounds.  Abdominal:     General: Abdomen is flat. There is no distension.     Palpations: Abdomen is soft.     Tenderness: There is no abdominal tenderness. There is no right CVA tenderness, left CVA tenderness or guarding.  Musculoskeletal:     Cervical back: Normal range of motion. No tenderness.     Right lower leg: Edema (Chronic lymphedema noted bilaterally) present.     Left lower leg: Edema present.  Skin:    General: Skin is warm and dry.     Coloration: Skin is not jaundiced or pale.     Findings: No bruising.  Neurological:     General: No focal deficit present.     Mental Status: She is  alert. Mental status is at baseline.  Psychiatric:        Mood and Affect: Mood normal.     ED Results / Procedures / Treatments   Labs (all labs ordered are listed, but only abnormal results are displayed) Labs Reviewed  BASIC METABOLIC PANEL - Abnormal; Notable for the following components:      Result Value   Glucose, Bld 102 (*)    Anion gap 3 (*)    All other components within normal limits  CBC  TROPONIN I (HIGH SENSITIVITY)  TROPONIN I (HIGH SENSITIVITY)    EKG EKG Interpretation Date/Time:  Tuesday March 25 2023 09:04:14 EST Ventricular Rate:  80 PR Interval:  168 QRS Duration:  86 QT Interval:  380 QTC Calculation: 438 R Axis:   16  Text Interpretation: Normal sinus rhythm Normal ECG When compared with ECG of 10-Feb-2023 20:35, PREVIOUS ECG IS PRESENT No acute changes No significant change since last tracing Confirmed by Derwood Kaplan [phone]) on 03/25/2023 10:24:22 AM  Radiology DG Chest 2 View Result Date: 03/25/2023 CLINICAL DATA:  Chest pain. EXAM: CHEST - 2 VIEW COMPARISON:  06/16/2021. FINDINGS: Bilateral lung fields are clear. Bilateral costophrenic angles are clear. Stable cardio-mediastinal silhouette. No acute osseous abnormalities. Cervicothoracic spinal fixation hardware seen. The soft tissues are within normal limits. IMPRESSION: *No active cardiopulmonary disease. Electronically Signed   By: Jules Schick M.D.   On: 03/25/2023 12:05    Procedures Procedures    Medications Ordered in ED Medications - No data to display  ED Course/ Medical Decision Making/ A&P Clinical Course as of 03/25/23 1445  Tue Mar 25, 2023  1416 Troponin I (High Sensitivity): 7 [CB]    Clinical Course User Index [CB] Lunette Stands, PA-C             HEART Score: 4                    Medical Decision Making Amount and/or Complexity of Data Reviewed Labs: ordered. Radiology: ordered.   This patient is a 77 year old female who presents to the ED for concern  of intermittent chest pain.   Differential diagnoses prior to evaluation: The emergent differential diagnosis includes, but is not limited to, ACS, PE, pneumonia, pleuritis, costochondritis, anxiety, aortic aneurysm,. This is not an exhaustive differential.   Past Medical History / Co-morbidities / Social History: Asthma, hypertension, hyperlipidemia, IBS, fibromyalgia, chronic UTI, CAD without angina.  Additional history: Chart reviewed. Pertinent results include: Previously seen on 03/17/2023 for UTI and prescribed Keflex.  Lab Tests/Imaging studies: I personally interpreted labs/imaging and  the pertinent results include:   Troponin was 6 delta 7 CBC shows no sign of systemic infection or anemia BMP is unremarkable Chest x-ray unremarkable with no cardiopulmonary pathology. I agree with the radiologist interpretation.  Cardiac monitoring: EKG obtained and interpreted by myself and attending physician which shows: Normal sinus rhythm   Medications: No medications were necessary for this visit.  I have reviewed the patients home medicines and have made adjustments as needed.  ED Course:  Patient is a 77 year old female presents to the ED complaining of intermittent chest pain starting this morning.  Previous medical history of asthma, hypertension, hyperlipidemia, IBS, fibromyalgia, chronic UTI, CAD without angina.  She states that this has happened once this morning radiating into her left arm and into her face.  She has since been asymptomatic outside of 1 episode of same symptoms in triage.  Currently asymptomatic.  However is complaining of neuropathy in her legs and fibromyalgia in the absence of a PCP with her next PCP visit scheduled for February.  She also has to be referred to cardiology due to extensive family history of cardiac disease with intermittent chest pain symptoms patient says no signs at this time for ACS with troponins and delta both WNL.  No indication for PE, ACS at  this time.  Patient vitals are stable.  This is most likely due to anxiousness however cannot rule out angina in the presence of previous CAD history we will have her follow-up with her cardiologist.  Provided strict return to ER precautions.  Patient appears stable for discharge at this time.  Disposition: After consideration of the diagnostic results and the patients response to treatment, I feel that the patient benefit from discharge and treatment noted as above with appropriate follow-ups to PCP, cardiology, neurology.   emergency department workup does not suggest an emergent condition requiring admission or immediate intervention beyond what has been performed at this time. The plan is: Appropriate follow-ups to PCP, cardiology, neurology for continued care, cardiac evaluation, neuropathic pain, provided strict return to ER precautions. The patient is safe for discharge and has been instructed to return immediately for worsening symptoms, change in symptoms or any other concerns.    Final Clinical Impression(s) / ED Diagnoses Final diagnoses:  Fibromyalgia  Chest pain, unspecified type    Rx / DC Orders ED Discharge Orders          Ordered    Ambulatory referral to Cardiology  Status:  Canceled       Comments: If you have not heard from the Cardiology office within the next 72 hours please call [phone].   03/25/23 1419    Ambulatory referral to Neurology       Comments: An appointment is requested in approximately: 2 weeks   03/25/23 1419          Portions of this report may have been transcribed using voice recognition software. Every effort was made to ensure accuracy; however, inadvertent computerized transcription errors may be present.     Lunette Stands, [place] 03/25/23 1459    Derwood Kaplan, MD 03/27/23 1001

## 2023-03-27 ENCOUNTER — Ambulatory Visit
Admission: EM | Admit: 2023-03-27 | Discharge: 2023-03-27 | Disposition: A | Discharge status: Home / Self Care | Payer: Medicare PPO | Attending: Family Medicine | Admitting: Family Medicine

## 2023-03-27 DIAGNOSIS — N3090 Cystitis, unspecified without hematuria: Secondary | ICD-10-CM | POA: Insufficient documentation

## 2023-03-27 LAB — POCT URINALYSIS DIP (MANUAL ENTRY)
Bilirubin, UA: NEGATIVE
Glucose, UA: NEGATIVE mg/dL
Ketones, POC UA: NEGATIVE mg/dL
Nitrite, UA: NEGATIVE
Protein Ur, POC: NEGATIVE mg/dL
Spec Grav, UA: 1.025 (ref 1.010–1.025)
Urobilinogen, UA: 0.2 U/dL
pH, UA: 6 (ref 5.0–8.0)

## 2023-03-27 MED ORDER — CEPHALEXIN 500 MG PO CAPS: 500.0000 mg | ORAL_CAPSULE | Freq: Two times a day (BID) | ORAL | 0 refills | Status: DC

## 2023-03-27 NOTE — ED Provider Notes (Signed)
[facility]    ASSESSMENT & PLAN:  1. Cystitis    Begin: Meds ordered this encounter  Medications   cephALEXin (KEFLEX) 500 MG capsule    Sig: Take 1 capsule (500 mg total) by mouth 2 (two) times daily.    Dispense:  10 capsule    Refill:  0   No signs of pyelonephritis. Urine culture sent. Will notify patient of any significant results. Ensure proper hydration. Will follow up with her PCP or here if not showing improvement over the next 48 hours, sooner if needed.  Outlined signs and symptoms indicating need for more acute intervention. Patient verbalized understanding. After Visit Summary given.  SUBJECTIVE:  Kayla [name] is a 77 y.o. female who complains of urinary urgency and dysuria for the past 2 d. Without associated flank pain, fever, chills, vaginal discharge or bleeding. Gross hematuria: not present. No specific aggravating or alleviating factors reported. No LE edema. Normal PO intake without n/v/d. Without specific abdominal pain. Ambulatory without difficulty. LMP: No LMP recorded. Patient is postmenopausal.  OBJECTIVE:  Vitals:   03/27/23 1143 03/27/23 1145  BP:  138/85  Pulse:  62  Resp:  18  Temp:  98 F (36.7 C)  TempSrc:  Oral  SpO2:  100%  Weight: 86.2 kg   Height: 5\' 3"  (1.6 m)    General appearance: alert; no distress HENT: oropharynx: moist Lungs: unlabored respirations Abdomen: soft Back: no CVA tenderness Extremities: no edema; symmetrical with no gross deformities Skin: warm and dry Neurologic: normal gait Psychological: alert and cooperative; normal mood and affect  Labs Reviewed  POCT URINALYSIS DIP (MANUAL ENTRY) - Abnormal; Notable for the following components:      Result Value   Blood, UA trace-intact (*)    Leukocytes, UA Small (1+) (*)    All other components within normal limits  URINE CULTURE    Allergies  Allergen Reactions   Ace Inhibitors Anaphylaxis    REACTION: glossal edema   Lisinopril Anaphylaxis     Past Medical History:  Diagnosis Date   Anemia    Anxiety    Asthma    B12 deficiency    Bursitis    Chronic LBP    Chronic UTI    Diastolic dysfunction    Fibromyalgia    GERD (gastroesophageal reflux disease)    Hearing impaired person, bilateral    per pt   History of low potassium    Hyperlipemia    Hypertension    Hypothyroid    IBS (irritable bowel syndrome)    Inflammatory osteoarthritis    Kidney infection    Psoriatic arthritis (HCC)    S/P cardiac cath 11/30/2007   normal coronaries - Dr. Excell Seltzer (Dr. Rennis Golden reviewed films on 01/10/2016)   Social History   Socioeconomic History   Marital status: Widowed    Spouse name: Not on file   Number of children: 2   Years of education: Not on file   Highest education level: Bachelor's degree (e.g., BA, AB, BS)  Occupational History   Not on file  Tobacco Use   Smoking status: Never    Passive exposure: Never   Smokeless tobacco: Never  Vaping Use   Vaping status: Never Used  Substance and Sexual Activity   Alcohol use: Never   Drug use: No   Sexual activity: Not on file  Other Topics Concern   Not on file  Social History Narrative   Lost husband 2009, lost mother 2016  Lives at home (son & 3 grandchildren live with her)   Caffeine: 10-12 oz daily   Social Drivers of Corporate investment banker Strain: Not on file  Food Insecurity: Not on file  Transportation Needs: Not on file  Physical Activity: Not on file  Stress: Not on file  Social Connections: Not on file  Intimate Partner Violence: Not on file   Family History  Problem Relation Age of Onset   Heart attack Father 70   Lung cancer Other        uncle, non smoker   Leukemia Other        uncle   Cancer Mother        oral   Stroke Mother    Alzheimer's disease Mother        at 17   Cancer Maternal Aunt        breast   Alcohol abuse Maternal Aunt    Alcohol abuse Maternal Uncle    Cancer Maternal Uncle        lung, stomach, oral    Cancer Paternal Uncle        lung   Congestive Heart Failure Maternal Grandfather    Heart disease Maternal Grandfather    Heart attack Paternal Grandfather    Cancer Paternal Uncle        bone   Brain cancer Maternal Grandmother    Alzheimer's disease Other        maternal great grandmother    Colon cancer Neg Hx    Breast cancer Neg Hx    Diabetes Neg Hx         Mardella Layman, MD 03/27/23 1225

## 2023-03-27 NOTE — Discharge Instructions (Addendum)
You have had labs (urine culture) sent today. We will call you with any significant abnormalities or if there is need to begin or change treatment or pursue further follow up.  You may also review your test results online through MyChart. If you do not have a MyChart account, instructions to sign up should be on your discharge paperwork.  

## 2023-03-27 NOTE — ED Triage Notes (Addendum)
Patient presents with pressure, pain and burning when urinating x day 2.   Patient reports runny nose, watery eyes and pressure inside ears.

## 2023-03-29 LAB — URINE CULTURE: Culture: 10000 — AB

## 2023-04-08 ENCOUNTER — Telehealth: Payer: Self-pay | Admitting: *Deleted

## 2023-04-08 ENCOUNTER — Ambulatory Visit
Admission: EM | Admit: 2023-04-08 | Discharge: 2023-04-08 | Disposition: A | Discharge status: Home / Self Care | Payer: Medicare PPO | Attending: Internal Medicine | Admitting: Internal Medicine

## 2023-04-08 DIAGNOSIS — N3000 Acute cystitis without hematuria: Secondary | ICD-10-CM | POA: Diagnosis not present

## 2023-04-08 LAB — POCT URINALYSIS DIP (MANUAL ENTRY)
Bilirubin, UA: NEGATIVE
Glucose, UA: NEGATIVE mg/dL
Ketones, POC UA: NEGATIVE mg/dL
Nitrite, UA: POSITIVE — AB
Protein Ur, POC: >=300 mg/dL — AB
Spec Grav, UA: >=1.03 — AB (ref 1.010–1.025)
Urobilinogen, UA: 0.2 U/dL
pH, UA: 6 (ref 5.0–8.0)

## 2023-04-08 MED ORDER — CIPROFLOXACIN HCL 500 MG PO TABS: 500.0000 mg | ORAL_TABLET | Freq: Two times a day (BID) | ORAL | 0 refills | Status: DC

## 2023-04-08 NOTE — ED Triage Notes (Addendum)
 Note: Patient has hearing difficulty. History of UTI's (& Sepsis). About 2 days ago with a lot of abd pain/pressure and urinary frequency/urgency and pain at times. This morning the lower abd pain was really bad. Some nausea. No vomiting. Seeking new referral for lower leg neuropathy, upcoming (new) pcp visit appointment.

## 2023-04-08 NOTE — Telephone Encounter (Signed)
 Pt called voicing concern about Cipro  prescription since she was dx with RA this year and asked if we could prescribe what she had last time. Discussed with Asberry Senters, PA-C. Explained to patient that Cipro  was prescribed since the provider felt it would provide better coverage. Pt agreeable to taking Cipro  and will notify us  if she has any issues

## 2023-04-09 LAB — URINE CULTURE: Culture: NO GROWTH

## 2023-04-10 ENCOUNTER — Telehealth: Payer: Self-pay | Admitting: *Deleted

## 2023-04-10 MED ORDER — CEPHALEXIN 500 MG PO CAPS: 500.0000 mg | ORAL_CAPSULE | Freq: Two times a day (BID) | ORAL | 0 refills | Status: DC

## 2023-04-13 NOTE — ED Provider Notes (Signed)
 [name]    CSN: [id number] Arrival date & time: 04/08/23  9061      History   Chief Complaint Chief Complaint  Patient presents with   UTI Symptoms   Back Pain    HPI Kayla [name] is a 78 y.o. female.   Patient here today for evaluation of possible UTI.  She reports lower abdominal pain, dysuria.  She has also had urinary frequency and urgency.  She denies any vomiting.  She has not had fever.  The history is provided by the patient.  Back Pain Associated symptoms: abdominal pain and dysuria   Associated symptoms: no fever     Past Medical History:  Diagnosis Date   Anemia    Anxiety    Asthma    B12 deficiency    Bursitis    Chronic LBP    Chronic UTI    Diastolic dysfunction    Fibromyalgia    GERD (gastroesophageal reflux disease)    Hearing impaired person, bilateral    per pt   History of low potassium    Hyperlipemia    Hypertension    Hypothyroid    IBS (irritable bowel syndrome)    Inflammatory osteoarthritis    Kidney infection    Psoriatic arthritis (HCC)    S/P cardiac cath 11/30/2007   normal coronaries - Dr. Wonda (Dr. Mona reviewed films on 01/10/2016)    Patient Active Problem List   Diagnosis Date Noted   Personal history of COVID-19 08/24/2020   Body mass index (BMI) 33.0-33.9, adult 06/20/2020   Status post lumbar microdiscectomy 06/20/2020   Essential (primary) hypertension 06/20/2020   Bilateral leg edema 06/17/2020   Cellulitis of leg, left 06/17/2020   Cellulitis of leg, right 06/17/2020   Lumbar herniated disc 05/25/2020   Hyponatremia 05/12/2020   Sciatica 05/12/2020   COVID-19 05/09/2020   Hypokalemia 05/04/2020   Dysphagia 04/17/2020   Renal insufficiency 12/29/2018   Cyst of left ovary 12/29/2018   Esophageal dysmotility 12/29/2018   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/29/2018   Acquired hypothyroidism 12/29/2018   Chronic maxillary sinusitis 07/07/2018   Snoring 06/01/2018    Costochondritis 06/01/2018   Family history of heart disease 06/01/2018   Class 2 severe obesity due to excess calories with serious comorbidity and body mass index (BMI) of 38.0 to 38.9 in adult (HCC) 06/01/2018   Multiple atypical skin moles 04/28/2017   Depression, recurrent (HCC) 07/16/2016   Adjustment disorder with mixed anxiety and depressed mood 01/15/2016   Fe Def Anemia 12/22/2015   Idiopathic scoliosis and kyphoscoliosis 12/20/2015   Osteopenia 12/20/2015   Fibromyalgia 08/02/2014   GAD (generalized anxiety disorder) 09/17/2012   Vitamin D  deficiency 01/24/2012   Fatigue 10/14/2011   B12 deficiency    Hyperlipidemia 11/30/2007   Coronary artery disease involving native heart without angina pectoris 11/30/2007   Essential hypertension 01/27/2007   GERD 01/27/2007   IBS 01/27/2007    Past Surgical History:  Procedure Laterality Date   CESAREAN SECTION  06/10/1975   CHOLECYSTECTOMY     LUMBAR LAMINECTOMY/DECOMPRESSION MICRODISCECTOMY Right 05/25/2020   Procedure: MICRODISCECTOMY Right Lumbar three - four;  Surgeon: Mavis Purchase, MD;  Location: [facility];  Service: Neurosurgery;  Laterality: Right;   NECK SURGERY  1998   RADIOLOGY WITH ANESTHESIA N/A 05/16/2020   Procedure: MRI LUMBAR SPINE W/CONTRAST;  Surgeon: Radiologist, Medication, MD;  Location: [facility] OR;  Service: Radiology;  Laterality: N/A;   repair broken C6 & C7  OB History   No obstetric history on file.      Home Medications    Prior to Admission medications   Medication Sig Start Date End Date Taking? Authorizing Provider  ciprofloxacin  (CIPRO ) 500 MG tablet Take 1 tablet (500 mg total) by mouth every 12 (twelve) hours. 04/08/23  Yes Billy Asberry FALCON, PA-C  acetaminophen  (TYLENOL ) 500 MG tablet Take 1,000 mg by mouth in the morning, at noon, in the evening, and at bedtime.    [provider]  amLODipine  (NORVASC ) 5 MG tablet Take 1 tablet (5 mg total) by mouth daily. 11/15/22   Lonni Slain, MD  bismuth subsalicylate (PEPTO BISMOL) 262 MG/15ML suspension Take 30 mLs by mouth every 6 (six) hours as needed for indigestion or diarrhea or loose stools.    [provider]  cephALEXin  (KEFLEX ) 500 MG capsule Take 1 capsule (500 mg total) by mouth 2 (two) times daily. 03/27/23   Rolinda Rogue, MD  cephALEXin  (KEFLEX ) 500 MG capsule Take 1 capsule (500 mg total) by mouth 2 (two) times daily. 04/10/23   Sofia, Leslie K, PA-C  Cholecalciferol (VITAMIN D3) 1.25 MG (50000 UT) CAPS Take 1 capsule by mouth once a week. Sunday 07/22/22   [provider]  clotrimazole  (LOTRIMIN ) 1 % cream Apply to affected area 2 times daily 01/20/23   Hazen Darryle BRAVO, FNP  [facility] VITAMIN B12 1000 MCG tablet Take 2,000 mcg by mouth daily.    [provider]  hydrALAZINE  (APRESOLINE ) 10 MG tablet Take 1 tablet (10 mg total) by mouth 3 (three) times daily as needed (for elevated blood pressure (top number more than 140, bottom number more than 90)). 02/14/23   Lonni Slain, MD  ketoconazole  (NIZORAL ) 2 % cream Apply 1 Application topically 2 (two) times daily as needed for irritation. 03/18/23   [provider]  levothyroxine  (SYNTHROID ) 175 MCG tablet Take 175 mcg by mouth daily before breakfast.    [provider]    Family History Family History  Problem Relation Age of Onset   Heart attack Father 56   Lung cancer Other        uncle, non smoker   Leukemia Other        uncle   Cancer Mother        oral   Stroke Mother    Alzheimer's disease Mother        at 49   Cancer Maternal Aunt        breast   Alcohol abuse Maternal Aunt    Alcohol abuse Maternal Uncle    Cancer Maternal Uncle        lung, stomach, oral   Cancer Paternal Uncle        lung   Congestive Heart Failure Maternal Grandfather    Heart disease Maternal Grandfather    Heart attack Paternal Grandfather    Cancer Paternal Uncle        bone   Brain cancer Maternal Grandmother     Alzheimer's disease Other        maternal great grandmother    Colon cancer Neg Hx    Breast cancer Neg Hx    Diabetes Neg Hx     Social History Social History   Tobacco Use   Smoking status: Never    Passive exposure: Never   Smokeless tobacco: Never  Vaping Use   Vaping status: Never Used  Substance Use Topics   Alcohol use: Never   Drug use: No  Allergies   Ace inhibitors and Lisinopril   Review of Systems Review of Systems  Constitutional:  Negative for chills and fever.  Eyes:  Negative for discharge and redness.  Respiratory:  Negative for shortness of breath.   Gastrointestinal:  Positive for abdominal pain. Negative for nausea and vomiting.  Genitourinary:  Positive for dysuria, frequency and urgency.  Musculoskeletal:  Positive for back pain.     Physical Exam Triage Vital Signs ED Triage Vitals  Encounter Vitals Group     BP 04/08/23 1050 (!) 155/92     Systolic BP Percentile --      Diastolic BP Percentile --      Pulse Rate 04/08/23 1050 77     Resp 04/08/23 1050 18     Temp 04/08/23 1050 97.8 F (36.6 C)     Temp Source 04/08/23 1050 Oral     SpO2 04/08/23 1050 96 %     Weight 04/08/23 1048 190 lb 0.6 oz (86.2 kg)     Height 04/08/23 1048 5' 3 (1.6 m)     Head Circumference --      Peak Flow --      Pain Score 04/08/23 1043 5     Pain Loc --      Pain Education --      Exclude from Growth Chart --    No data found.  Updated Vital Signs BP (!) 163/83 (BP Location: Left Arm)   Pulse 83   Temp 97.8 F (36.6 C) (Oral)   Resp 18   Ht 5' 3 (1.6 m)   Wt 190 lb 0.6 oz (86.2 kg)   SpO2 96%   BMI 33.66 kg/m   Visual Acuity Right Eye Distance:   Left Eye Distance:   Bilateral Distance:    Right Eye Near:   Left Eye Near:    Bilateral Near:     Physical Exam Vitals and nursing note reviewed.  Constitutional:      General: She is not in acute distress.    Appearance: Normal appearance. She is not ill-appearing.  HENT:      Head: Normocephalic and atraumatic.  Eyes:     Conjunctiva/sclera: Conjunctivae normal.  Cardiovascular:     Rate and Rhythm: Normal rate.  Pulmonary:     Effort: Pulmonary effort is normal. No respiratory distress.  Neurological:     Mental Status: She is alert.  Psychiatric:        Mood and Affect: Mood normal.        Behavior: Behavior normal.        Thought Content: Thought content normal.      UC Treatments / Results  Labs (all labs ordered are listed, but only abnormal results are displayed) Labs Reviewed  POCT URINALYSIS DIP (MANUAL ENTRY) - Abnormal; Notable for the following components:      Result Value   Color, UA light yellow (*)    Clarity, UA cloudy (*)    Spec Grav, UA >=1.030 (*)    Blood, UA large (*)    Protein Ur, POC >=300 (*)    Nitrite, UA Positive (*)    Leukocytes, UA Small (1+) (*)    All other components within normal limits  URINE CULTURE    EKG   Radiology No results found.  Procedures Procedures (including critical care time)  Medications Ordered in UC Medications - No data to display  Initial Impression / Assessment and Plan / UC Course  I have reviewed the triage  vital signs and the nursing notes.  Pertinent labs & imaging results that were available during my care of the patient were reviewed by me and considered in my medical decision making (see chart for details).    Will treat to cover UTI with Cipro  and urine culture ordered.  Discussed that we would be unable to place a referral for neuropathy and recommended further evaluation by primary care provider.  Patient expresses understanding.  Encouraged follow-up with any further concerns or worsening symptoms.  Final Clinical Impressions(s) / UC Diagnoses   Final diagnoses:  Acute cystitis without hematuria   Discharge Instructions   None    ED Prescriptions     Medication Sig Dispense Auth. Provider   ciprofloxacin  (CIPRO ) 500 MG tablet Take 1 tablet (500 mg total)  by mouth every 12 (twelve) hours. 10 tablet Billy Asberry FALCON, PA-C      PDMP not reviewed this encounter.   Billy Asberry FALCON, PA-C 04/13/23 [phone]

## 2023-04-27 ENCOUNTER — Encounter (HOSPITAL_COMMUNITY): Payer: Self-pay | Admitting: *Deleted

## 2023-04-27 ENCOUNTER — Other Ambulatory Visit: Payer: Self-pay

## 2023-04-27 ENCOUNTER — Ambulatory Visit (HOSPITAL_COMMUNITY)
Admission: EM | Admit: 2023-04-27 | Discharge: 2023-04-27 | Disposition: A | Discharge status: Home / Self Care | Payer: Medicare PPO | Attending: Emergency Medicine | Admitting: Emergency Medicine

## 2023-04-27 ENCOUNTER — Telehealth (HOSPITAL_COMMUNITY): Payer: Self-pay

## 2023-04-27 DIAGNOSIS — R300 Dysuria: Secondary | ICD-10-CM | POA: Diagnosis not present

## 2023-04-27 DIAGNOSIS — B354 Tinea corporis: Secondary | ICD-10-CM | POA: Insufficient documentation

## 2023-04-27 LAB — POCT URINALYSIS DIP (MANUAL ENTRY)
Bilirubin, UA: NEGATIVE
Glucose, UA: NEGATIVE mg/dL
Ketones, POC UA: NEGATIVE mg/dL
Nitrite, UA: NEGATIVE
Protein Ur, POC: NEGATIVE mg/dL
Spec Grav, UA: <=1.005 — AB (ref 1.010–1.025)
Urobilinogen, UA: 0.2 U/dL
pH, UA: 6 (ref 5.0–8.0)

## 2023-04-27 MED ORDER — NYSTATIN 100000 UNIT/GM EX CREA: TOPICAL_CREAM | CUTANEOUS | 0 refills | Status: DC

## 2023-04-27 MED ORDER — PREDNISONE 20 MG PO TABS: 40.0000 mg | ORAL_TABLET | Freq: Every day | ORAL | 0 refills | Status: DC

## 2023-04-27 MED ORDER — HYDROXYZINE HCL 25 MG PO TABS: 25.0000 mg | ORAL_TABLET | Freq: Three times a day (TID) | ORAL | 0 refills | Status: DC | PRN

## 2023-04-27 MED ORDER — NYSTATIN 100000 UNIT/GM EX CREA: TOPICAL_CREAM | CUTANEOUS | 0 refills | Status: AC

## 2023-04-27 MED ORDER — PREDNISONE 20 MG PO TABS: 40.0000 mg | ORAL_TABLET | Freq: Every day | ORAL | 0 refills | Status: AC

## 2023-04-27 NOTE — Telephone Encounter (Signed)
Patient requesting the medications fro her visit today be transferred to the [facility] on [address].

## 2023-04-27 NOTE — Discharge Instructions (Addendum)
Start taking prednisone over the next 5 days.  I have sent nystatin cream to the pharmacy that you can apply to the affected areas.  I have also sent hydroxyzine that you can take every 8 hours as needed for itching.  Your urine culture results will return over the next few days and someone will call if results are positive and you require treatment.  Return here as needed.  I have attached a dermatologist and a urologist that you can follow-up with regarding recurrent issues.

## 2023-04-27 NOTE — ED Provider Notes (Signed)
[facility]    CSN: [id number] Arrival date & time: 04/27/23  1439      History   Chief Complaint No chief complaint on file.   HPI Kayla [name] is a 78 y.o. female.   Patient presents with dysuria and vaginal irritation x 4 days.  Patient also reports rash under left breast that has spread down her left side.  Patient states that she has similar rash to groin area.  Patient has been recently treated twice for UTI.     Past Medical History:  Diagnosis Date   Anemia    Anxiety    Asthma    B12 deficiency    Bursitis    Chronic LBP    Chronic UTI    Diastolic dysfunction    Fibromyalgia    GERD (gastroesophageal reflux disease)    Hearing impaired person, bilateral    per pt   History of low potassium    Hyperlipemia    Hypertension    Hypothyroid    IBS (irritable bowel syndrome)    Inflammatory osteoarthritis    Kidney infection    Psoriatic arthritis (HCC)    S/P cardiac cath 11/30/2007   normal coronaries - Dr. Excell Seltzer (Dr. Rennis Golden reviewed films on 01/10/2016)    Patient Active Problem List   Diagnosis Date Noted   Personal history of COVID-19 08/24/2020   Body mass index (BMI) 33.0-33.9, adult 06/20/2020   Status post lumbar microdiscectomy 06/20/2020   Essential (primary) hypertension 06/20/2020   Bilateral leg edema 06/17/2020   Cellulitis of leg, left 06/17/2020   Cellulitis of leg, right 06/17/2020   Lumbar herniated disc 05/25/2020   Hyponatremia 05/12/2020   Sciatica 05/12/2020   COVID-19 05/09/2020   Hypokalemia 05/04/2020   Dysphagia 04/17/2020   Renal insufficiency 12/29/2018   Cyst of left ovary 12/29/2018   Esophageal dysmotility 12/29/2018   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/29/2018   Acquired hypothyroidism 12/29/2018   Chronic maxillary sinusitis 07/07/2018   Snoring 06/01/2018   Costochondritis 06/01/2018   Family history of heart disease 06/01/2018   Class 2 severe obesity due to excess calories with  serious comorbidity and body mass index (BMI) of 38.0 to 38.9 in adult (HCC) 06/01/2018   Multiple atypical skin moles 04/28/2017   Depression, recurrent (HCC) 07/16/2016   Adjustment disorder with mixed anxiety and depressed mood 01/15/2016   Fe Def Anemia 12/22/2015   Idiopathic scoliosis and kyphoscoliosis 12/20/2015   Osteopenia 12/20/2015   Fibromyalgia 08/02/2014   GAD (generalized anxiety disorder) 09/17/2012   Vitamin D deficiency 01/24/2012   Fatigue 10/14/2011   B12 deficiency    Hyperlipidemia 11/30/2007   Coronary artery disease involving native heart without angina pectoris 11/30/2007   Essential hypertension 01/27/2007   GERD 01/27/2007   IBS 01/27/2007    Past Surgical History:  Procedure Laterality Date   CESAREAN SECTION  06/10/1975   CHOLECYSTECTOMY     LUMBAR LAMINECTOMY/DECOMPRESSION MICRODISCECTOMY Right 05/25/2020   Procedure: MICRODISCECTOMY Right Lumbar three - four;  Surgeon: Tressie Stalker, MD;  Location: [facility] OR;  Service: Neurosurgery;  Laterality: Right;   NECK SURGERY  1998   RADIOLOGY WITH ANESTHESIA N/A 05/16/2020   Procedure: MRI LUMBAR SPINE W/CONTRAST;  Surgeon: Radiologist, Medication, MD;  Location: [facility] OR;  Service: Radiology;  Laterality: N/A;   repair broken C6 & C7      OB History   No obstetric history on file.      Home Medications    Prior to Admission  medications   Medication Sig Start Date End Date Taking? Authorizing Provider  acetaminophen (TYLENOL) 500 MG tablet Take 1,000 mg by mouth in the morning, at noon, in the evening, and at bedtime.    [provider]  amLODipine (NORVASC) 5 MG tablet Take 1 tablet (5 mg total) by mouth daily. 11/15/22   Jodelle Red, MD  bismuth subsalicylate (PEPTO BISMOL) 262 MG/15ML suspension Take 30 mLs by mouth every 6 (six) hours as needed for indigestion or diarrhea or loose stools.    [provider]  cephALEXin (KEFLEX) 500 MG capsule Take 1 capsule (500 mg total) by  mouth 2 (two) times daily. 03/27/23   Mardella Layman, MD  cephALEXin (KEFLEX) 500 MG capsule Take 1 capsule (500 mg total) by mouth 2 (two) times daily. 04/10/23   Elson Areas, PA-C  Cholecalciferol (VITAMIN D3) 1.25 MG (50000 UT) CAPS Take 1 capsule by mouth once a week. Sunday 07/22/22   [provider]  ciprofloxacin (CIPRO) 500 MG tablet Take 1 tablet (500 mg total) by mouth every 12 (twelve) hours. 04/08/23   Tomi Bamberger, PA-C  [employer] VITAMIN B12 1000 MCG tablet Take 2,000 mcg by mouth daily.    [provider]  hydrALAZINE (APRESOLINE) 10 MG tablet Take 1 tablet (10 mg total) by mouth 3 (three) times daily as needed (for elevated blood pressure (top number more than 140, bottom number more than 90)). 02/14/23   Jodelle Red, MD  hydrOXYzine (ATARAX) 25 MG tablet Take 1 tablet (25 mg total) by mouth every 8 (eight) hours as needed for itching. 04/27/23   Wynonia Lawman A, NP  levothyroxine (SYNTHROID) 175 MCG tablet Take 175 mcg by mouth daily before breakfast.    [provider]  nystatin cream (MYCOSTATIN) Apply to affected area 2 times daily 04/27/23   Wynonia Lawman A, NP  predniSONE (DELTASONE) 20 MG tablet Take 2 tablets (40 mg total) by mouth daily for 5 days. 04/27/23 05/02/23  Letta Kocher, NP    Family History Family History  Problem Relation Age of Onset   Heart attack Father 32   Lung cancer Other        uncle, non smoker   Leukemia Other        uncle   Cancer Mother        oral   Stroke Mother    Alzheimer's disease Mother        at 46   Cancer Maternal Aunt        breast   Alcohol abuse Maternal Aunt    Alcohol abuse Maternal Uncle    Cancer Maternal Uncle        lung, stomach, oral   Cancer Paternal Uncle        lung   Congestive Heart Failure Maternal Grandfather    Heart disease Maternal Grandfather    Heart attack Paternal Grandfather    Cancer Paternal Uncle        bone   Brain cancer Maternal Grandmother     Alzheimer's disease Other        maternal great grandmother    Colon cancer Neg Hx    Breast cancer Neg Hx    Diabetes Neg Hx     Social History Social History   Tobacco Use   Smoking status: Never    Passive exposure: Never   Smokeless tobacco: Never  Vaping Use   Vaping status: Never Used  Substance Use Topics   Alcohol use: Never  Drug use: No     Allergies   Ace inhibitors and Lisinopril   Review of Systems Review of Systems  Constitutional:  Negative for chills, fatigue and fever.  Gastrointestinal:  Negative for abdominal pain.  Genitourinary:  Positive for dysuria and vaginal discharge.  Skin:  Positive for rash.     Physical Exam Triage Vital Signs ED Triage Vitals  Encounter Vitals Group     BP 04/27/23 1608 (!) 152/93     Systolic BP Percentile --      Diastolic BP Percentile --      Pulse Rate 04/27/23 1608 86     Resp 04/27/23 1608 20     Temp 04/27/23 1608 97.8 F (36.6 C)     Temp src --      SpO2 04/27/23 1608 98 %     Weight --      Height --      Head Circumference --      Peak Flow --      Pain Score 04/27/23 1606 10     Pain Loc --      Pain Education --      Exclude from Growth Chart --    No data found.  Updated Vital Signs BP (!) 152/93   Pulse 86   Temp 97.8 F (36.6 C)   Resp 20   SpO2 98%   Visual Acuity Right Eye Distance:   Left Eye Distance:   Bilateral Distance:    Right Eye Near:   Left Eye Near:    Bilateral Near:     Physical Exam Vitals and nursing note reviewed.  Constitutional:      General: She is awake. She is not in acute distress.    Appearance: Normal appearance. She is well-developed and well-groomed. She is not ill-appearing.  Genitourinary:    Comments: Exam declined. Skin:    Findings: Rash present. Rash is urticarial.     Comments: Rash noted to left side of anterior trunk starting under her left breast.  Moderate amount of redness and moisture noted under left breast.  Neurological:      Mental Status: She is alert.  Psychiatric:        Behavior: Behavior is cooperative.      UC Treatments / Results  Labs (all labs ordered are listed, but only abnormal results are displayed) Labs Reviewed  POCT URINALYSIS DIP (MANUAL ENTRY) - Abnormal; Notable for the following components:      Result Value   Spec Grav, UA <=1.005 (*)    Blood, UA small (*)    Leukocytes, UA Moderate (2+) (*)    All other components within normal limits  URINE CULTURE    EKG   Radiology No results found.  Procedures Procedures (including critical care time)  Medications Ordered in UC Medications - No data to display  Initial Impression / Assessment and Plan / UC Course  I have reviewed the triage vital signs and the nursing notes.  Pertinent labs & imaging results that were available during my care of the patient were reviewed by me and considered in my medical decision making (see chart for details).     Patient presented with dysuria and vaginal irritation x 4 days.  Patient also reports rash under left breast that is spread down her left side.  Patient endorses similar rash to groin area.  Patient has been seen here twice recently for UTI.  Patient endorses dysuria mainly near vaginal and urethral opening.  Upon assessment patient has rash noted to left side of anterior trunk starting under her left breast with moderate amount of redness and moisture noted under left breast consistent with yeast.  No other significant findings upon exam.  UA revealed moderate leukocytes and small blood, will send culture.  Due to recent treatment of UTI recommended waiting for urine culture to confirm if there is UTI present.  Prescribed nystatin cream to apply to area under left breast and prescribed prednisone to help with rash.  Also recommended applying hydrocortisone cream to the area.  Patient states that she already has prescription for Diflucan at home and I informed her she should take a dose  tonight and another 1 in 3 days if symptoms persist.  Prescribed Vistaril as needed for itching.  Discussed follow-up and return precautions. Final Clinical Impressions(s) / UC Diagnoses   Final diagnoses:  Tinea corporis  Dysuria     Discharge Instructions      Start taking prednisone over the next 5 days.  I have sent nystatin cream to the pharmacy that you can apply to the affected areas.  I have also sent hydroxyzine that you can take every 8 hours as needed for itching.  Your urine culture results will return over the next few days and someone will call if results are positive and you require treatment.  Return here as needed.  I have attached a dermatologist and a urologist that you can follow-up with regarding recurrent issues.    ED Prescriptions     Medication Sig Dispense Auth. Provider   nystatin cream (MYCOSTATIN)  (Status: Discontinued) Apply to affected area 2 times daily 30 g Wynonia Lawman A, NP   predniSONE (DELTASONE) 20 MG tablet  (Status: Discontinued) Take 2 tablets (40 mg total) by mouth daily for 5 days. 10 tablet Wynonia Lawman A, NP   hydrOXYzine (ATARAX) 25 MG tablet  (Status: Discontinued) Take 1 tablet (25 mg total) by mouth every 8 (eight) hours as needed for itching. 12 tablet Wynonia Lawman A, NP   hydrOXYzine (ATARAX) 25 MG tablet Take 1 tablet (25 mg total) by mouth every 8 (eight) hours as needed for itching. 12 tablet Wynonia Lawman A, NP   nystatin cream (MYCOSTATIN) Apply to affected area 2 times daily 30 g Wynonia Lawman A, NP   predniSONE (DELTASONE) 20 MG tablet Take 2 tablets (40 mg total) by mouth daily for 5 days. 10 tablet Wynonia Lawman A, NP      PDMP not reviewed this encounter.   Wynonia Lawman A, NP 04/27/23 [phone]

## 2023-04-27 NOTE — ED Triage Notes (Signed)
PT reports she has a UTI and was seen on 04/07/24 for same. Pt has Sx's.

## 2023-04-28 ENCOUNTER — Telehealth (HOSPITAL_COMMUNITY): Payer: Self-pay | Admitting: *Deleted

## 2023-04-28 LAB — URINE CULTURE: Culture: <10000 — AB

## 2023-04-28 NOTE — Telephone Encounter (Signed)
Pt called to see if she could get antibiotics for her urine culture results. She seen that they were abnormal on mychart. Per Rn no antibiotics per protocol. Pt states Provider who seen her yesterday advised her she would be getting a call and given an antibiotic. Pt states she is having pressure and burning when she urinates and has had several UTI recently.   I will forward message to provider in clinic today and I will call pt back if change in treatment.

## 2023-04-28 NOTE — Telephone Encounter (Signed)
I agree with the callback RN that no antibiotics are indicated at this time.  She has an insignificant colony count of E. coli which is usually contamination from the surrounding area.  From Kayla [name]'s note and what she described I think she has yeast causing her symptoms and antibiotics would only make this worse  She should take the fluconazole as an instructed by the NP yesterday.  She should also follow-up with her primary care about this issue

## 2023-04-28 NOTE — Telephone Encounter (Signed)
Pt advised she is worried since she has heard she shouldn't take diflucan and prednisone together. Spoke with Dr Marlinda Mike and she states ok to take together. Pt aware and states she is going to take prednisone for a couple of days and see if it helps if not she will take diflucan.  Advised to follow up with [place] or her PCP if needed. Pt verbalized understanding.

## 2023-05-20 ENCOUNTER — Ambulatory Visit: Payer: Medicare PPO | Admitting: Family Medicine

## 2023-05-27 ENCOUNTER — Ambulatory Visit
Admission: EM | Admit: 2023-05-27 | Discharge: 2023-05-27 | Disposition: A | Discharge status: Home / Self Care | Payer: Medicare PPO | Attending: Family Medicine | Admitting: Family Medicine

## 2023-05-27 DIAGNOSIS — R300 Dysuria: Secondary | ICD-10-CM | POA: Insufficient documentation

## 2023-05-27 DIAGNOSIS — N390 Urinary tract infection, site not specified: Secondary | ICD-10-CM | POA: Insufficient documentation

## 2023-05-27 LAB — POCT URINALYSIS DIP (MANUAL ENTRY)
Bilirubin, UA: NEGATIVE
Blood, UA: NEGATIVE
Glucose, UA: NEGATIVE mg/dL
Ketones, POC UA: NEGATIVE mg/dL
Nitrite, UA: NEGATIVE
Protein Ur, POC: 30 mg/dL — AB
Spec Grav, UA: 1.02
Urobilinogen, UA: 0.2 U/dL
pH, UA: 6

## 2023-05-27 MED ORDER — CEPHALEXIN 500 MG PO CAPS: 500.0000 mg | ORAL_CAPSULE | Freq: Two times a day (BID) | ORAL | 0 refills | Status: AC

## 2023-05-27 NOTE — ED Triage Notes (Addendum)
Pt c/o dysuria and bilat lower abd pressure that began today. Did a home test that was  positive for infection. Hx of complicated UTIs. Denis n/v. Endorses flank tenderness.  Pt also reports she recurrent rash. Two days left of Prednisone.

## 2023-05-27 NOTE — Discharge Instructions (Signed)
Your urine culture will result within 3 days.  We will notify you if there is any changes in treatment based on urine culture results.  If you do not hear anything from our clinic continue with current treatment as prescribed.  Hydrate well with fluids.  If you develop any fever, nausea or vomiting or severe abdominal pain go immediately to the emergency department.

## 2023-05-27 NOTE — ED Provider Notes (Signed)
[name]    CSN: [id number] Arrival date & time: 05/27/23  1531      History   Chief Complaint Chief Complaint  Patient presents with   Rash   Dysuria    HPI Kayla [name] is a 78 y.o. female.   Kayla [name] is a 78 y.o. female presents for evaluation of  dysuria today and took a home urine test which care today positive UTI.  Denies fever chills nausea or vomiting.  Reports a history of complicated UTIs.  UTI diagnosable here with a positive urine culture was in December which was positive for E. coli.  Patient was treated with Keflex.  . Uncertain of prior urine pathology. No LMP recorded. Patient is postmenopausal.   Past Medical History:  Diagnosis Date   Anemia    Anxiety    Asthma    B12 deficiency    Bursitis    Chronic LBP    Chronic UTI    Diastolic dysfunction    Fibromyalgia    GERD (gastroesophageal reflux disease)    Hearing impaired person, bilateral    per pt   History of low potassium    Hyperlipemia    Hypertension    Hypothyroid    IBS (irritable bowel syndrome)    Inflammatory osteoarthritis    Kidney infection    Psoriatic arthritis (HCC)    S/P cardiac cath 11/30/2007   normal coronaries - Dr. Excell Seltzer (Dr. Rennis Golden reviewed films on 01/10/2016)    Patient Active Problem List   Diagnosis Date Noted   Personal history of COVID-19 08/24/2020   Body mass index (BMI) 33.0-33.9, adult 06/20/2020   Status post lumbar microdiscectomy 06/20/2020   Essential (primary) hypertension 06/20/2020   Bilateral leg edema 06/17/2020   Cellulitis of leg, left 06/17/2020   Cellulitis of leg, right 06/17/2020   Lumbar herniated disc 05/25/2020   Hyponatremia 05/12/2020   Sciatica 05/12/2020   COVID-19 05/09/2020   Hypokalemia 05/04/2020   Dysphagia 04/17/2020   Renal insufficiency 12/29/2018   Cyst of left ovary 12/29/2018   Esophageal dysmotility 12/29/2018   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/29/2018   Acquired hypothyroidism  12/29/2018   Chronic maxillary sinusitis 07/07/2018   Snoring 06/01/2018   Costochondritis 06/01/2018   Family history of heart disease 06/01/2018   Class 2 severe obesity due to excess calories with serious comorbidity and body mass index (BMI) of 38.0 to 38.9 in adult (HCC) 06/01/2018   Multiple atypical skin moles 04/28/2017   Depression, recurrent (HCC) 07/16/2016   Adjustment disorder with mixed anxiety and depressed mood 01/15/2016   Fe Def Anemia 12/22/2015   Idiopathic scoliosis and kyphoscoliosis 12/20/2015   Osteopenia 12/20/2015   Fibromyalgia 08/02/2014   GAD (generalized anxiety disorder) 09/17/2012   Vitamin D deficiency 01/24/2012   Fatigue 10/14/2011   B12 deficiency    Hyperlipidemia 11/30/2007   Coronary artery disease involving native heart without angina pectoris 11/30/2007   Essential hypertension 01/27/2007   GERD 01/27/2007   IBS 01/27/2007    Past Surgical History:  Procedure Laterality Date   CESAREAN SECTION  06/10/1975   CHOLECYSTECTOMY     LUMBAR LAMINECTOMY/DECOMPRESSION MICRODISCECTOMY Right 05/25/2020   Procedure: MICRODISCECTOMY Right Lumbar three - four;  Surgeon: Tressie Stalker, MD;  Location: [facility] OR;  Service: Neurosurgery;  Laterality: Right;   NECK SURGERY  1998   RADIOLOGY WITH ANESTHESIA N/A 05/16/2020   Procedure: MRI LUMBAR SPINE W/CONTRAST;  Surgeon: Radiologist, Medication, MD;  Location: [facility] OR;  Service:  Radiology;  Laterality: N/A;   repair broken C6 & C7      OB History   No obstetric history on file.      Home Medications    Prior to Admission medications   Medication Sig Start Date End Date Taking? Authorizing Provider  cephALEXin (KEFLEX) 500 MG capsule Take 1 capsule (500 mg total) by mouth 2 (two) times daily for 7 days. 05/27/23 06/03/23 Yes Bing Neighbors, NP  acetaminophen (TYLENOL) 500 MG tablet Take 1,000 mg by mouth in the morning, at noon, in the evening, and at bedtime.    [provider]  amLODipine  (NORVASC) 5 MG tablet Take 1 tablet (5 mg total) by mouth daily. 11/15/22   Jodelle Red, MD  bismuth subsalicylate (PEPTO BISMOL) 262 MG/15ML suspension Take 30 mLs by mouth every 6 (six) hours as needed for indigestion or diarrhea or loose stools.    [provider]  Cholecalciferol (VITAMIN D3) 1.25 MG (50000 UT) CAPS Take 1 capsule by mouth once a week. Sunday 07/22/22   [provider]  ciprofloxacin (CIPRO) 500 MG tablet Take 1 tablet (500 mg total) by mouth every 12 (twelve) hours. 04/08/23   Tomi Bamberger, PA-C  [employer] VITAMIN B12 1000 MCG tablet Take 2,000 mcg by mouth daily.    [provider]  hydrALAZINE (APRESOLINE) 10 MG tablet Take 1 tablet (10 mg total) by mouth 3 (three) times daily as needed (for elevated blood pressure (top number more than 140, bottom number more than 90)). 02/14/23   Jodelle Red, MD  hydrOXYzine (ATARAX) 25 MG tablet Take 1 tablet (25 mg total) by mouth every 8 (eight) hours as needed for itching. 04/27/23   Wynonia Lawman A, NP  levothyroxine (SYNTHROID) 175 MCG tablet Take 175 mcg by mouth daily before breakfast.    [provider]  nystatin cream (MYCOSTATIN) Apply to affected area 2 times daily 04/27/23   Letta Kocher, NP    Family History Family History  Problem Relation Age of Onset   Heart attack Father 74   Lung cancer Other        uncle, non smoker   Leukemia Other        uncle   Cancer Mother        oral   Stroke Mother    Alzheimer's disease Mother        at 19   Cancer Maternal Aunt        breast   Alcohol abuse Maternal Aunt    Alcohol abuse Maternal Uncle    Cancer Maternal Uncle        lung, stomach, oral   Cancer Paternal Uncle        lung   Congestive Heart Failure Maternal Grandfather    Heart disease Maternal Grandfather    Heart attack Paternal Grandfather    Cancer Paternal Uncle        bone   Brain cancer Maternal Grandmother    Alzheimer's disease Other         maternal great grandmother    Colon cancer Neg Hx    Breast cancer Neg Hx    Diabetes Neg Hx     Social History Social History   Tobacco Use   Smoking status: Never    Passive exposure: Never   Smokeless tobacco: Never  Vaping Use   Vaping status: Never Used  Substance Use Topics   Alcohol use: Never   Drug use: No  Allergies   Ace inhibitors, Lisinopril, and Ciprofloxacin   Review of Systems Review of Systems  Genitourinary:  Positive for dysuria.  Skin:  Positive for rash.     Physical Exam Triage Vital Signs ED Triage Vitals [05/27/23 1727]  Encounter Vitals Group     BP (!) 148/95     Systolic BP Percentile      Diastolic BP Percentile      Pulse Rate 90     Resp 18     Temp 97.8 F (36.6 C)     Temp Source Oral     SpO2 95 %     Weight      Height      Head Circumference      Peak Flow      Pain Score      Pain Loc      Pain Education      Exclude from Growth Chart    No data found.  Updated Vital Signs BP (!) 148/95 (BP Location: Left Arm)   Pulse 90   Temp 97.8 F (36.6 C) (Oral)   Resp 18   SpO2 95%   Visual Acuity Right Eye Distance:   Left Eye Distance:   Bilateral Distance:    Right Eye Near:   Left Eye Near:    Bilateral Near:     Physical Exam General appearance: Alert, well developed, well nourished, cooperative Head: Normocephalic, without obvious abnormality, atraumatic Respiratory: Respirations even and unlabored, normal respiratory rate Heart: Rate and rhythm normal.   CVA: No flank pain Extremities: No gross deformities Skin: Skin color, texture, turgor normal. No rashes seen  Psych: Appropriate mood and affect.   UC Treatments / Results  Labs (all labs ordered are listed, but only abnormal results are displayed) Labs Reviewed  POCT URINALYSIS DIP (MANUAL ENTRY) - Abnormal; Notable for the following components:      Result Value   Clarity, UA cloudy (*)    Protein Ur, POC =30 (*)    Leukocytes, UA Trace  (*)    All other components within normal limits  URINE CULTURE    EKG   Radiology No results found.  Procedures Procedures (including critical care time)  Medications Ordered in UC Medications - No data to display  Initial Impression / Assessment and Plan / UC Course  I have reviewed the triage vital signs and the nursing notes.  Pertinent labs & imaging results that were available during my care of the patient were reviewed by me and considered in my medical decision making (see chart for details).      UA abnormal and findings consistent with UTI. Empiric antibiotic treatment initiated. Encouraged increase intake of water. Urine culture pending.  ER if symptoms become severe. Follow-up with PCP if symptoms do not completely resolve.  Final Clinical Impressions(s) / UC Diagnoses   Final diagnoses:  Dysuria  Acute UTI     Discharge Instructions      Your urine culture will result within 3 days.  We will notify you if there is any changes in treatment based on urine culture results.  If you do not hear anything from our clinic continue with current treatment as prescribed.  Hydrate well with fluids.  If you develop any fever, nausea or vomiting or severe abdominal pain go immediately to the emergency department.      ED Prescriptions     Medication Sig Dispense Auth. Provider   cephALEXin (KEFLEX) 500 MG capsule Take 1 capsule (500  mg total) by mouth 2 (two) times daily for 7 days. 14 capsule Bing Neighbors, NP      PDMP not reviewed this encounter.   Bing Neighbors, NP 05/27/23 [phone]

## 2023-05-29 LAB — URINE CULTURE
Culture: 30000 — AB
Special Requests: NORMAL

## 2023-06-05 ENCOUNTER — Encounter (HOSPITAL_COMMUNITY): Payer: Self-pay

## 2023-06-05 ENCOUNTER — Other Ambulatory Visit: Payer: Self-pay

## 2023-06-05 ENCOUNTER — Emergency Department (HOSPITAL_COMMUNITY)
Admission: EM | Admit: 2023-06-05 | Discharge: 2023-06-05 | Disposition: A | Discharge status: Home / Self Care | Payer: Medicare PPO | Attending: Emergency Medicine | Admitting: Emergency Medicine

## 2023-06-05 ENCOUNTER — Emergency Department (HOSPITAL_COMMUNITY): Payer: Medicare PPO

## 2023-06-05 DIAGNOSIS — E876 Hypokalemia: Secondary | ICD-10-CM | POA: Insufficient documentation

## 2023-06-05 DIAGNOSIS — I10 Essential (primary) hypertension: Secondary | ICD-10-CM | POA: Insufficient documentation

## 2023-06-05 DIAGNOSIS — R600 Localized edema: Secondary | ICD-10-CM | POA: Insufficient documentation

## 2023-06-05 DIAGNOSIS — R21 Rash and other nonspecific skin eruption: Secondary | ICD-10-CM | POA: Insufficient documentation

## 2023-06-05 DIAGNOSIS — R8290 Unspecified abnormal findings in urine: Secondary | ICD-10-CM | POA: Insufficient documentation

## 2023-06-05 DIAGNOSIS — Z79899 Other long term (current) drug therapy: Secondary | ICD-10-CM | POA: Diagnosis not present

## 2023-06-05 DIAGNOSIS — R1031 Right lower quadrant pain: Secondary | ICD-10-CM | POA: Diagnosis not present

## 2023-06-05 DIAGNOSIS — E039 Hypothyroidism, unspecified: Secondary | ICD-10-CM | POA: Insufficient documentation

## 2023-06-05 DIAGNOSIS — K5730 Diverticulosis of large intestine without perforation or abscess without bleeding: Secondary | ICD-10-CM | POA: Diagnosis not present

## 2023-06-05 DIAGNOSIS — R109 Unspecified abdominal pain: Secondary | ICD-10-CM | POA: Insufficient documentation

## 2023-06-05 DIAGNOSIS — N83292 Other ovarian cyst, left side: Secondary | ICD-10-CM | POA: Diagnosis not present

## 2023-06-05 DIAGNOSIS — I2510 Atherosclerotic heart disease of native coronary artery without angina pectoris: Secondary | ICD-10-CM | POA: Insufficient documentation

## 2023-06-05 DIAGNOSIS — K429 Umbilical hernia without obstruction or gangrene: Secondary | ICD-10-CM | POA: Diagnosis not present

## 2023-06-05 DIAGNOSIS — R1032 Left lower quadrant pain: Secondary | ICD-10-CM | POA: Diagnosis not present

## 2023-06-05 LAB — COMPREHENSIVE METABOLIC PANEL
ALT: 9 U/L (ref 0–44)
AST: 15 U/L (ref 15–41)
Albumin: 3.7 g/dL (ref 3.5–5.0)
Alkaline Phosphatase: 84 U/L (ref 38–126)
Anion gap: 10 (ref 5–15)
BUN: 8 mg/dL (ref 8–23)
CO2: 23 mmol/L (ref 22–32)
Calcium: 9.2 mg/dL (ref 8.9–10.3)
Chloride: 107 mmol/L (ref 98–111)
Creatinine, Ser: 0.93 mg/dL (ref 0.44–1.00)
GFR, Estimated: >60 mL/min (ref >60)
Glucose, Bld: 110 mg/dL — ABNORMAL HIGH (ref 70–99)
Potassium: 2.9 mmol/L — ABNORMAL LOW (ref 3.5–5.1)
Sodium: 140 mmol/L (ref 135–145)
Total Bilirubin: 0.7 mg/dL (ref 0.0–1.2)
Total Protein: 7 g/dL (ref 6.5–8.1)

## 2023-06-05 LAB — URINALYSIS, W/ REFLEX TO CULTURE (INFECTION SUSPECTED)
Bacteria, UA: NONE SEEN
Bilirubin Urine: 2 — AB
Glucose, UA: 0 mg/dL — AB
Hgb urine dipstick: 0 — AB
Nitrite: 0 — AB
Protein, ur: 1 mg/dL — AB
RBC / HPF: NONE SEEN RBC/hpf (ref 0–5)
Specific Gravity, Urine: 1.025 (ref 1.005–1.030)
pH: 5.5 (ref 5.0–8.0)

## 2023-06-05 LAB — CBC WITH DIFFERENTIAL/PLATELET
Abs Immature Granulocytes: 0.01 10*3/uL (ref 0.00–0.07)
Basophils Absolute: 0 10*3/uL (ref 0.0–0.1)
Basophils Relative: 0 %
Eosinophils Absolute: 0.2 10*3/uL (ref 0.0–0.5)
Eosinophils Relative: 3 %
HCT: 40.9 % (ref 36.0–46.0)
Hemoglobin: 13.5 g/dL (ref 12.0–15.0)
Immature Granulocytes: 0 %
Lymphocytes Relative: 41 %
Lymphs Abs: 2.4 10*3/uL (ref 0.7–4.0)
MCH: 31.3 pg (ref 26.0–34.0)
MCHC: 33 g/dL (ref 30.0–36.0)
MCV: 94.9 fL (ref 80.0–100.0)
Monocytes Absolute: 0.6 10*3/uL (ref 0.1–1.0)
Monocytes Relative: 11 %
Neutro Abs: 2.6 10*3/uL (ref 1.7–7.7)
Neutrophils Relative %: 45 %
Platelets: 272 10*3/uL (ref 150–400)
RBC: 4.31 MIL/uL (ref 3.87–5.11)
RDW: 12.4 % (ref 11.5–15.5)
WBC: 5.8 10*3/uL (ref 4.0–10.5)
nRBC: 0 % (ref 0.0–0.2)

## 2023-06-05 MED ORDER — POTASSIUM CHLORIDE CRYS ER 20 MEQ PO TBCR
40.0000 meq | EXTENDED_RELEASE_TABLET | Freq: Once | ORAL | Status: AC
  Administered 2023-06-05: 40 meq via ORAL
  Filled 2023-06-05: qty 2

## 2023-06-05 MED ORDER — IOHEXOL 300 MG/ML  SOLN
100.0000 mL | Freq: Once | INTRAMUSCULAR | Status: AC | PRN
  Administered 2023-06-05: 100 mL via INTRAVENOUS

## 2023-06-05 NOTE — ED Triage Notes (Signed)
 Dysuria since last night with dark urination. Has seen some blood in urine and has had right sided flank pain that radiates into abdomen

## 2023-06-05 NOTE — ED Provider Triage Note (Signed)
 Emergency Medicine Provider Triage Evaluation Note  NEVELYN MELLOTT , a 78 y.o. female  was evaluated in triage.  Pt complains of dysuria.  Review of Systems  Positive: Dysuria, urgency, frequency, flank pain, pubic pain, chills Negative: Fever, hematuria, vomiting, nausea, shortness of breath,  Physical Exam  BP 128/89   Pulse 83   Temp 97.9 F (36.6 C) (Oral)   Resp 17   Ht 5\' 3"  (1.6 m)   Wt 86.2 kg   SpO2 98%   BMI 33.66 kg/m  Gen:   Awake, no distress   Resp:  Normal effort  MSK:   Moves extremities without difficulty  Other:    Medical Decision Making  Medically screening exam initiated at 12:47 PM.  Appropriate orders placed.  Geraldo Pitter was informed that the remainder of the evaluation will be completed by another provider, this initial triage assessment does not replace that evaluation, and the importance of remaining in the ED until their evaluation is complete.  Labs and imaging ordered   Gretta Began 06/05/23 1250

## 2023-06-05 NOTE — ED Provider Notes (Signed)
 [place] [facility] AT [facility] Provider Note   CSN: [id number] Arrival date & time: 06/05/23  1229     History {Add pertinent medical, surgical, social history, OB history to HPI:1} Chief Complaint  Patient presents with   Dysuria    Kayla [name] is a 78 y.o. female with past medical history of HLD, HTN, CAD, GERD, IBS, GAD, fibromyalgia, hypothyroidism presents emergency department for evaluation of possible complaints.  She no longer has a primary care provider due to being too "complicated".  She has a new PCP with first appointment in May but is worried as none of her symptoms are being treated. Primarily she sought ED evaluation for dark urine with right flank pain that has intermittently been persistent for the past 8 months.   She also complains of a pruritic rash under her breast bilaterally and under stomach.  She was seen by dermatologist who provided her with clotrimazole cream with some relief.  However, rash is now spread to anterior portion of upper arms bilaterally.  She denies change in laundry detergent, soaps, new medicines, new foods. She did a Microbiologist and is worried about her liver as etiology for rash  She also complains of bilateral lower extremity swelling, pain that has been persistent for past three years. She had a negative DVT study in 2022. ABI evaluation by vascular was normal. Was evaluated by cards who noted EF WNL. Evaluated by neurology for neuropathy pain and provided gabapentin and recommended for pain management    Dysuria      Home Medications Prior to Admission medications   Medication Sig Start Date End Date Taking? Authorizing Provider  acetaminophen (TYLENOL) 500 MG tablet Take 1,000 mg by mouth in the morning, at noon, in the evening, and at bedtime.    [provider]  amLODipine (NORVASC) 5 MG tablet Take 1 tablet (5 mg total) by mouth daily. 11/15/22   Jodelle Red, MD  bismuth subsalicylate  (PEPTO BISMOL) 262 MG/15ML suspension Take 30 mLs by mouth every 6 (six) hours as needed for indigestion or diarrhea or loose stools.    [provider]  Cholecalciferol (VITAMIN D3) 1.25 MG (50000 UT) CAPS Take 1 capsule by mouth once a week. Sunday 07/22/22   [provider]  ciprofloxacin (CIPRO) 500 MG tablet Take 1 tablet (500 mg total) by mouth every 12 (twelve) hours. 04/08/23   Tomi Bamberger, PA-C  [facility] VITAMIN B12 1000 MCG tablet Take 2,000 mcg by mouth daily.    [provider]  hydrALAZINE (APRESOLINE) 10 MG tablet Take 1 tablet (10 mg total) by mouth 3 (three) times daily as needed (for elevated blood pressure (top number more than 140, bottom number more than 90)). 02/14/23   Jodelle Red, MD  hydrOXYzine (ATARAX) 25 MG tablet Take 1 tablet (25 mg total) by mouth every 8 (eight) hours as needed for itching. 04/27/23   Wynonia Lawman A, NP  levothyroxine (SYNTHROID) 175 MCG tablet Take 175 mcg by mouth daily before breakfast.    [provider]  nystatin cream (MYCOSTATIN) Apply to affected area 2 times daily 04/27/23   Wynonia Lawman A, NP      Allergies    Ace inhibitors, Lisinopril, and Ciprofloxacin    Review of Systems   Review of Systems  Genitourinary:  Positive for dysuria.    Physical Exam Updated Vital Signs BP 138/80 (BP Location: Left Arm)   Pulse 70   Temp 97.9 F (36.6 C) (Oral)  Resp 18   Ht 5\' 3"  (1.6 m)   Wt 86.2 kg   SpO2 97%   BMI 33.66 kg/m  Physical Exam  ED Results / Procedures / Treatments   Labs (all labs ordered are listed, but only abnormal results are displayed) Labs Reviewed  URINALYSIS, W/ REFLEX TO CULTURE (INFECTION SUSPECTED) - Abnormal; Notable for the following components:      Result Value   APPearance HAZY (*)    Glucose, UA 0 (*)    Hgb urine dipstick 0 (*)    Bilirubin Urine 2 (*)    Ketones, ur TRACE (*)    Protein, ur 1 (*)    Nitrite 0 (*)    Leukocytes,Ua TRACE (*)     All other components within normal limits  COMPREHENSIVE METABOLIC PANEL - Abnormal; Notable for the following components:   Potassium 2.9 (*)    Glucose, Bld 110 (*)    All other components within normal limits  CBC WITH DIFFERENTIAL/PLATELET  CBC WITH DIFFERENTIAL/PLATELET    EKG None  Radiology No results found.  Procedures Procedures  {Document cardiac monitor, telemetry assessment procedure when appropriate:1}  Medications Ordered in ED Medications  iohexol (OMNIPAQUE) 300 MG/ML solution 100 mL (has no administration in time range)    ED Course/ Medical Decision Making/ A&P   {   Click here for ABCD2, HEART and other calculatorsREFRESH Note before signing :1}                              Medical Decision Making Amount and/or Complexity of Data Reviewed Labs: ordered.  Risk Prescription drug management.   ***  {Document critical care time when appropriate:1} {Document review of labs and clinical decision tools ie heart score, Chads2Vasc2 etc:1}  {Document your independent review of radiology images, and any outside records:1} {Document your discussion with family members, caretakers, and with consultants:1} {Document social determinants of health affecting pt's care:1} {Document your decision making why or why not admission, treatments were needed:1} Final Clinical Impression(s) / ED Diagnoses Final diagnoses:  None    Rx / DC Orders ED Discharge Orders     None

## 2023-06-05 NOTE — Discharge Instructions (Addendum)
 Thank you for letting us evaluate you today.  Your urine was not grossly infected.  I have sent a urine culture to ensure that it does not grow anything.  We will call you if it needs antibiotics.  Your CT was negative for kidney stone, cause of pain. There is a small area in right lobe found that could be inflammatory and it is recommended that you get another CT in 6 mo to see if it goes away. This would be managed by PCP  Potassium was mildly low here in emergency department.  We provided you with supplementation.  Please make sure to eat banana, yogurt, sweet potatoes, potatoes, beans, spinach to increase potassium over the next couple days  I provided you with urology follow-up for dark urine.  I have also provided you with a PCP follow-up in case and get you in earlier than May

## 2023-06-07 LAB — URINE CULTURE: Culture: 10000 — AB

## 2023-06-08 ENCOUNTER — Encounter (HOSPITAL_COMMUNITY): Payer: Self-pay

## 2023-06-08 ENCOUNTER — Emergency Department (HOSPITAL_COMMUNITY)
Admission: EM | Admit: 2023-06-08 | Discharge: 2023-06-08 | Disposition: A | Discharge status: Home / Self Care | Attending: Emergency Medicine | Admitting: Emergency Medicine

## 2023-06-08 ENCOUNTER — Other Ambulatory Visit: Payer: Self-pay

## 2023-06-08 DIAGNOSIS — J45909 Unspecified asthma, uncomplicated: Secondary | ICD-10-CM | POA: Diagnosis not present

## 2023-06-08 DIAGNOSIS — E039 Hypothyroidism, unspecified: Secondary | ICD-10-CM | POA: Insufficient documentation

## 2023-06-08 DIAGNOSIS — I10 Essential (primary) hypertension: Secondary | ICD-10-CM | POA: Insufficient documentation

## 2023-06-08 DIAGNOSIS — R197 Diarrhea, unspecified: Secondary | ICD-10-CM | POA: Insufficient documentation

## 2023-06-08 DIAGNOSIS — R112 Nausea with vomiting, unspecified: Secondary | ICD-10-CM | POA: Insufficient documentation

## 2023-06-08 DIAGNOSIS — E876 Hypokalemia: Secondary | ICD-10-CM | POA: Diagnosis not present

## 2023-06-08 DIAGNOSIS — Z79899 Other long term (current) drug therapy: Secondary | ICD-10-CM | POA: Insufficient documentation

## 2023-06-08 LAB — CBC WITH DIFFERENTIAL/PLATELET
Abs Immature Granulocytes: 0.02 10*3/uL (ref 0.00–0.07)
Basophils Absolute: 0 10*3/uL (ref 0.0–0.1)
Basophils Relative: 0 %
Eosinophils Absolute: 0.1 10*3/uL (ref 0.0–0.5)
Eosinophils Relative: 1 %
HCT: 48.6 % — ABNORMAL HIGH (ref 36.0–46.0)
Hemoglobin: 15.5 g/dL — ABNORMAL HIGH (ref 12.0–15.0)
Immature Granulocytes: 0 %
Lymphocytes Relative: 17 %
Lymphs Abs: 1.4 10*3/uL (ref 0.7–4.0)
MCH: 31 pg (ref 26.0–34.0)
MCHC: 31.9 g/dL (ref 30.0–36.0)
MCV: 97.2 fL (ref 80.0–100.0)
Monocytes Absolute: 0.6 10*3/uL (ref 0.1–1.0)
Monocytes Relative: 7 %
Neutro Abs: 5.9 10*3/uL (ref 1.7–7.7)
Neutrophils Relative %: 75 %
Platelets: 357 10*3/uL (ref 150–400)
RBC: 5 MIL/uL (ref 3.87–5.11)
RDW: 12.9 % (ref 11.5–15.5)
WBC: 8 10*3/uL (ref 4.0–10.5)
nRBC: 0 % (ref 0.0–0.2)

## 2023-06-08 LAB — COMPREHENSIVE METABOLIC PANEL
ALT: 9 U/L (ref 0–44)
AST: 14 U/L — ABNORMAL LOW (ref 15–41)
Albumin: 3.7 g/dL (ref 3.5–5.0)
Alkaline Phosphatase: 97 U/L (ref 38–126)
Anion gap: 9 (ref 5–15)
BUN: 16 mg/dL (ref 8–23)
CO2: 18 mmol/L — ABNORMAL LOW (ref 22–32)
Calcium: 9.3 mg/dL (ref 8.9–10.3)
Chloride: 110 mmol/L (ref 98–111)
Creatinine, Ser: 1.07 mg/dL — ABNORMAL HIGH (ref 0.44–1.00)
GFR, Estimated: 53 mL/min — ABNORMAL LOW (ref >60)
Glucose, Bld: 106 mg/dL — ABNORMAL HIGH (ref 70–99)
Potassium: 3.2 mmol/L — ABNORMAL LOW (ref 3.5–5.1)
Sodium: 137 mmol/L (ref 135–145)
Total Bilirubin: 0.7 mg/dL (ref 0.0–1.2)
Total Protein: 6.9 g/dL (ref 6.5–8.1)

## 2023-06-08 LAB — LIPASE, BLOOD: Lipase: 34 U/L (ref 11–51)

## 2023-06-08 MED ORDER — POTASSIUM CHLORIDE CRYS ER 20 MEQ PO TBCR
40.0000 meq | EXTENDED_RELEASE_TABLET | Freq: Once | ORAL | Status: AC
  Administered 2023-06-08: 40 meq via ORAL
  Filled 2023-06-08: qty 2

## 2023-06-08 MED ORDER — LACTATED RINGERS IV BOLUS
1000.0000 mL | Freq: Once | INTRAVENOUS | Status: AC
  Administered 2023-06-08: 1000 mL via INTRAVENOUS

## 2023-06-08 MED ORDER — ONDANSETRON 4 MG PO TBDP: ORAL_TABLET | ORAL | 0 refills | Status: DC

## 2023-06-08 MED ORDER — POTASSIUM CHLORIDE CRYS ER 20 MEQ PO TBCR: 20.0000 meq | EXTENDED_RELEASE_TABLET | Freq: Every day | ORAL | 0 refills | Status: DC

## 2023-06-08 NOTE — ED Triage Notes (Signed)
 Pt seen here 2/27 and had potassium of 2.9. Pt states she has been trying to hydrate and drink Gatorade. Pt has had diarrhea and vomiting and states she is unable to hold anything down.

## 2023-06-08 NOTE — Discharge Instructions (Addendum)
 Your potassium level is slightly low  Take Zofran for nausea  Stay hydrated  Need to follow-up with a primary care doctor to get repeat potassium level in a week  Return to ER if you have severe dehydration or abdominal pain or vomiting

## 2023-06-08 NOTE — ED Provider Notes (Signed)
 [place] [facility] AT [facility] Provider Note   CSN: [id number] Arrival date & time: 06/08/23  1355     History  Chief Complaint  Patient presents with   Diarrhea    Kayla [name] is a 78 y.o. female.   Diarrhea Patient presents with nausea vomiting diarrhea.  Was seen on the 27th here for reportedly nausea and vomiting.  Patient states the diarrhea started yesterday.  However reviewing note from that visit does not mention her nausea or vomiting.  Mentions a rash.  However they did do urinalysis and CT scan CT scan is reassuring.  Urine culture did show only 10,000 CFU's of E. coli.  Reviewing previous cultures appears like this has been positive for couple years and is likely chronic colonization.  Patient states that she was told she was severely dehydrated so she needed to drink Gatorade but states she was not given any IV fluid. States she drank some Gatorade and then was drinking another Gatorade and vomited up. States that she has not been sleeping.  States she has not been sleeping because she is worried she could have diarrhea.  States she is incontinent baseline is had less urine output.  Denies dysuria.      Past Medical History:  Diagnosis Date   Anemia    Anxiety    Asthma    B12 deficiency    Bursitis    Chronic LBP    Chronic UTI    Diastolic dysfunction    Fibromyalgia    GERD (gastroesophageal reflux disease)    Hearing impaired person, bilateral    per pt   History of low potassium    Hyperlipemia    Hypertension    Hypothyroid    IBS (irritable bowel syndrome)    Inflammatory osteoarthritis    Kidney infection    Psoriatic arthritis (HCC)    S/P cardiac cath 11/30/2007   normal coronaries - Dr. Excell Seltzer (Dr. Rennis Golden reviewed films on 01/10/2016)    Home Medications Prior to Admission medications   Medication Sig Start Date End Date Taking? Authorizing Provider  acetaminophen (TYLENOL) 500 MG tablet Take 1,000 mg by mouth  in the morning, at noon, in the evening, and at bedtime.    [provider]  amLODipine (NORVASC) 5 MG tablet Take 1 tablet (5 mg total) by mouth daily. 11/15/22   Jodelle Red, MD  bismuth subsalicylate (PEPTO BISMOL) 262 MG/15ML suspension Take 30 mLs by mouth every 6 (six) hours as needed for indigestion or diarrhea or loose stools.    [provider]  Cholecalciferol (VITAMIN D3) 1.25 MG (50000 UT) CAPS Take 1 capsule by mouth once a week. Sunday 07/22/22   [provider]  ciprofloxacin (CIPRO) 500 MG tablet Take 1 tablet (500 mg total) by mouth every 12 (twelve) hours. 04/08/23   Tomi Bamberger, PA-C  [facility] VITAMIN B12 1000 MCG tablet Take 2,000 mcg by mouth daily.    [provider]  hydrALAZINE (APRESOLINE) 10 MG tablet Take 1 tablet (10 mg total) by mouth 3 (three) times daily as needed (for elevated blood pressure (top number more than 140, bottom number more than 90)). 02/14/23   Jodelle Red, MD  hydrOXYzine (ATARAX) 25 MG tablet Take 1 tablet (25 mg total) by mouth every 8 (eight) hours as needed for itching. 04/27/23   Wynonia Lawman A, NP  levothyroxine (SYNTHROID) 175 MCG tablet Take 175 mcg by mouth daily before breakfast.    [provider]  nystatin cream (MYCOSTATIN) Apply to affected area 2 times daily 04/27/23   Wynonia Lawman A, NP      Allergies    Ace inhibitors, Lisinopril, and Ciprofloxacin    Review of Systems   Review of Systems  Gastrointestinal:  Positive for diarrhea.    Physical Exam Updated Vital Signs BP 121/71 (BP Location: Right Arm)   Pulse (!) 102   Temp 98.8 F (37.1 C) (Oral)   Resp 14   Ht 5\' 4"  (1.626 m)   Wt 79.4 kg   SpO2 99%   BMI 30.04 kg/m  Physical Exam Vitals and nursing note reviewed.  Cardiovascular:     Rate and Rhythm: Tachycardia present.  Pulmonary:     Breath sounds: No wheezing.  Abdominal:     Tenderness: There is no abdominal tenderness.  Musculoskeletal:      Cervical back: Neck supple.  Skin:    General: Skin is warm.  Neurological:     Mental Status: She is alert and oriented to person, place, and time.     Comments: Patient is hard of hearing     ED Results / Procedures / Treatments   Labs (all labs ordered are listed, but only abnormal results are displayed) Labs Reviewed  COMPREHENSIVE METABOLIC PANEL  LIPASE, BLOOD  CBC WITH DIFFERENTIAL/PLATELET    EKG None  Radiology No results found.  Procedures Procedures    Medications Ordered in ED Medications  lactated ringers bolus 1,000 mL (has no administration in time range)    ED Course/ Medical Decision Making/ A&P                                 Medical Decision Making Amount and/or Complexity of Data Reviewed Labs: ordered.   Patient with multiple complaints.  Nausea vomiting diarrhea weakness not sleeping.  Reviewed lab work and CT scan from couple days ago.  Has had 11 ER visits in the last 6 months.  Reportedly is going to start a new PCP because her last 1 found her too complex.  With decreased oral intake and diarrhea along with the previous hypokalemia we will give fluid bolus and check basic blood work.  Benign abdominal exam.  Do not think that we need to get urinalysis at this time.  Care turned over to Dr Silverio Lay        Final Clinical Impression(s) / ED Diagnoses Final diagnoses:  None    Rx / DC Orders ED Discharge Orders     None         Benjiman Core, MD 06/08/23 1431

## 2023-06-08 NOTE — ED Provider Notes (Signed)
  Physical Exam  BP 121/71 (BP Location: Right Arm)   Pulse (!) 102   Temp 98.8 F (37.1 C) (Oral)   Resp 14   Ht 5\' 4"  (1.626 m)   Wt 79.4 kg   SpO2 99%   BMI 30.04 kg/m   Physical Exam  Procedures  Procedures  ED Course / MDM    Medical Decision Making Care assumed at 3 PM.  Patient is here with nausea vomiting and diarrhea.  Patient had similar symptoms several days ago.  Patient was given potassium and was sent home.  Patient had another episode of nausea and diarrhea was concern for dehydration.  Signout pending labs and reassessment  4:15 PM I reviewed patient's labs and potassium is 3.2 and bicarb is 18.  Anion gap is normal.  Patient was given potassium and IV fluids and felt better.  Will discharge home with potassium pills.  She states that she is very picky eater and does not like to eat bananas and beans.  I encouraged her to follow-up with PCP to recheck potassium level in a week  Problems Addressed: Diarrhea, unspecified type: acute illness or injury Hypokalemia: acute illness or injury  Amount and/or Complexity of Data Reviewed Labs: ordered.  Risk Prescription drug management.          Charlynne Pander, MD 06/08/23 [phone]

## 2023-07-10 ENCOUNTER — Encounter: Payer: Self-pay | Admitting: Family Medicine

## 2023-07-10 ENCOUNTER — Other Ambulatory Visit: Payer: Self-pay | Admitting: Family Medicine

## 2023-07-10 ENCOUNTER — Ambulatory Visit: Admitting: Family Medicine

## 2023-07-10 VITALS — BP 118/80 | HR 120 | Temp 97.9°F | Resp 18 | Ht 64.0 in | Wt 166.2 lb

## 2023-07-10 DIAGNOSIS — E66812 Obesity, class 2: Secondary | ICD-10-CM

## 2023-07-10 DIAGNOSIS — R82998 Other abnormal findings in urine: Secondary | ICD-10-CM | POA: Diagnosis not present

## 2023-07-10 DIAGNOSIS — M150 Primary generalized (osteo)arthritis: Secondary | ICD-10-CM

## 2023-07-10 DIAGNOSIS — N289 Disorder of kidney and ureter, unspecified: Secondary | ICD-10-CM

## 2023-07-10 DIAGNOSIS — E538 Deficiency of other specified B group vitamins: Secondary | ICD-10-CM | POA: Diagnosis not present

## 2023-07-10 DIAGNOSIS — E559 Vitamin D deficiency, unspecified: Secondary | ICD-10-CM | POA: Diagnosis not present

## 2023-07-10 DIAGNOSIS — H9202 Otalgia, left ear: Secondary | ICD-10-CM | POA: Insufficient documentation

## 2023-07-10 DIAGNOSIS — I10 Essential (primary) hypertension: Secondary | ICD-10-CM | POA: Diagnosis not present

## 2023-07-10 DIAGNOSIS — N390 Urinary tract infection, site not specified: Secondary | ICD-10-CM | POA: Diagnosis not present

## 2023-07-10 DIAGNOSIS — G5793 Unspecified mononeuropathy of bilateral lower limbs: Secondary | ICD-10-CM

## 2023-07-10 DIAGNOSIS — R21 Rash and other nonspecific skin eruption: Secondary | ICD-10-CM | POA: Insufficient documentation

## 2023-07-10 DIAGNOSIS — K219 Gastro-esophageal reflux disease without esophagitis: Secondary | ICD-10-CM

## 2023-07-10 DIAGNOSIS — I2510 Atherosclerotic heart disease of native coronary artery without angina pectoris: Secondary | ICD-10-CM

## 2023-07-10 DIAGNOSIS — E782 Mixed hyperlipidemia: Secondary | ICD-10-CM

## 2023-07-10 DIAGNOSIS — E039 Hypothyroidism, unspecified: Secondary | ICD-10-CM

## 2023-07-10 DIAGNOSIS — G44321 Chronic post-traumatic headache, intractable: Secondary | ICD-10-CM | POA: Insufficient documentation

## 2023-07-10 LAB — LIPID PANEL
Cholesterol: 199 mg/dL (ref 0–200)
HDL: 52.2 mg/dL (ref >39.00)
LDL Cholesterol: 111 mg/dL — ABNORMAL HIGH (ref 0–99)
NonHDL: 146.39
Total CHOL/HDL Ratio: 4
Triglycerides: 178 mg/dL — ABNORMAL HIGH (ref 0.0–149.0)
VLDL: 35.6 mg/dL (ref 0.0–40.0)

## 2023-07-10 LAB — POC URINALSYSI DIPSTICK (AUTOMATED)
Blood, UA: NEGATIVE
Glucose, UA: NEGATIVE
Nitrite, UA: NEGATIVE
Protein, UA: POSITIVE — AB
Spec Grav, UA: 1.01 (ref 1.010–1.025)
Urobilinogen, UA: 0.2 U/dL
pH, UA: 6 (ref 5.0–8.0)

## 2023-07-10 LAB — COMPREHENSIVE METABOLIC PANEL WITH GFR
ALT: 7 U/L (ref 0–35)
AST: 12 U/L (ref 0–37)
Albumin: 4.3 g/dL (ref 3.5–5.2)
Alkaline Phosphatase: 100 U/L (ref 39–117)
BUN: 11 mg/dL (ref 6–23)
CO2: 24 meq/L (ref 19–32)
Calcium: 9.8 mg/dL (ref 8.4–10.5)
Chloride: 108 meq/L (ref 96–112)
Creatinine, Ser: 0.89 mg/dL (ref 0.40–1.20)
GFR: 62.23 mL/min (ref >60.00)
Glucose, Bld: 105 mg/dL — ABNORMAL HIGH (ref 70–99)
Potassium: 3.6 meq/L (ref 3.5–5.1)
Sodium: 141 meq/L (ref 135–145)
Total Bilirubin: 0.6 mg/dL (ref 0.2–1.2)
Total Protein: 6.8 g/dL (ref 6.0–8.3)

## 2023-07-10 LAB — CBC WITH DIFFERENTIAL/PLATELET
Basophils Absolute: 0 10*3/uL (ref 0.0–0.1)
Basophils Relative: 0.3 % (ref 0.0–3.0)
Eosinophils Absolute: 0.1 10*3/uL (ref 0.0–0.7)
Eosinophils Relative: 1 % (ref 0.0–5.0)
HCT: 46.2 % — ABNORMAL HIGH (ref 36.0–46.0)
Hemoglobin: 15.2 g/dL — ABNORMAL HIGH (ref 12.0–15.0)
Lymphocytes Relative: 24 % (ref 12.0–46.0)
Lymphs Abs: 2.5 10*3/uL (ref 0.7–4.0)
MCHC: 32.9 g/dL (ref 30.0–36.0)
MCV: 95.6 fl (ref 78.0–100.0)
Monocytes Absolute: 0.9 10*3/uL (ref 0.1–1.0)
Monocytes Relative: 8.5 % (ref 3.0–12.0)
Neutro Abs: 6.9 10*3/uL (ref 1.4–7.7)
Neutrophils Relative %: 66.2 % (ref 43.0–77.0)
Platelets: 323 10*3/uL (ref 150.0–400.0)
RBC: 4.84 Mil/uL (ref 3.87–5.11)
RDW: 13.7 % (ref 11.5–15.5)
WBC: 10.5 10*3/uL (ref 4.0–10.5)

## 2023-07-10 LAB — MAGNESIUM: Magnesium: 2 mg/dL (ref 1.5–2.5)

## 2023-07-10 LAB — TSH: TSH: 0.02 u[IU]/mL — ABNORMAL LOW (ref 0.35–5.50)

## 2023-07-10 LAB — VITAMIN D 25 HYDROXY (VIT D DEFICIENCY, FRACTURES): VITD: 49.23 ng/mL (ref 30.00–100.00)

## 2023-07-10 LAB — VITAMIN B12: Vitamin B-12: 610 pg/mL (ref 211–911)

## 2023-07-10 MED ORDER — PANTOPRAZOLE SODIUM 40 MG PO TBEC: 40.0000 mg | DELAYED_RELEASE_TABLET | Freq: Every day | ORAL | 3 refills | Status: DC

## 2023-07-10 MED ORDER — AZELASTINE-FLUTICASONE 137-50 MCG/ACT NA SUSP: 1.0000 | Freq: Two times a day (BID) | NASAL | 5 refills | Status: AC

## 2023-07-10 MED ORDER — CLOTRIMAZOLE-BETAMETHASONE 1-0.05 % EX CREA
1.0000 | TOPICAL_CREAM | Freq: Every day | CUTANEOUS | 0 refills | Status: DC
Start: 2023-07-10 — End: 2023-08-14

## 2023-07-10 NOTE — Assessment & Plan Note (Signed)
 Cont tylenol as needed

## 2023-07-10 NOTE — Assessment & Plan Note (Signed)
 Tolerating statin, encouraged heart healthy diet, avoid trans fats, minimize simple carbs and saturated fats. Increase exercise as tolerated

## 2023-07-10 NOTE — Assessment & Plan Note (Signed)
 Pt is requesting a neuro referral

## 2023-07-10 NOTE — Assessment & Plan Note (Signed)
 Check ua / culture  Treat if +

## 2023-07-10 NOTE — Assessment & Plan Note (Signed)
 Recheck labs

## 2023-07-10 NOTE — Assessment & Plan Note (Signed)
 Per cardiology

## 2023-07-10 NOTE — Progress Notes (Signed)
 New Patient Office Visit  Subjective    Patient ID: Kayla [name], female    DOB: 1946/01/06  Age: 78 y.o. MRN: [id number]  CC:  Chief Complaint  Patient presents with   New Patient (Initial Visit)    Pt states here to establish care and may have UTI. Pt states "burning for days"    HPI Kayla [name] presents to establish care Discussed the use of AI scribe software for clinical note transcription with the patient, who gave verbal consent to proceed.  History of Present Illness Kayla [name] is a 78 year old female with recurrent urinary tract infections and chronic kidney disease who presents with urinary issues and multiple other complaints.  She experiences recurrent urinary tract infections, requiring urgent care visits approximately every 30 days. Symptoms began two days ago, and a home test strip indicated a bladder infection. She has a history of sepsis related to her kidneys and has difficulty staying hydrated, which she believes exacerbates her issues.  She has a history of stage three chronic kidney disease, though it has not been recently confirmed. She experienced anaphylactic shock from lisinopril in the past, resulting in a three-day hospitalization.  She presents with a rash that has been persistent despite using various prescribed treatments. The rash temporarily resolved with prednisone but recurred shortly after discontinuation. She has tried multiple creams and ointments without lasting relief.  She is hearing impaired and reports her left ear pops when she chews or swallows, causing pain and neck stiffness. She experiences nasal congestion but denies using any medication for it.  She reports frequent headaches, particularly after eating, and associates them with GERD, which was diagnosed a few years ago. She experiences indigestion and has been advised to sleep on her left side to alleviate symptoms, though she finds it uncomfortable.  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 She describes a past incident where a dermatologist misdiagnosed blood clots, leading to an unnecessary emergency room visit.  She has a stressful home environment, living with her disabled son and his three children, which contributes to her stress levels. She has experienced multiple family losses recently, including the unexpected death of her brother.    Outpatient Encounter Medications as of 07/10/2023  Medication Sig   acetaminophen (TYLENOL) 500 MG tablet Take 1,000 mg by mouth in the morning, at noon, in the evening, and at bedtime.   amLODipine (NORVASC) 5 MG tablet Take 1 tablet (5 mg total) by mouth daily.   Azelastine-Fluticasone 137-50 MCG/ACT SUSP Place 1 spray into the nose every 12 (twelve) hours.   bismuth subsalicylate (PEPTO BISMOL) 262 MG/15ML suspension Take 30 mLs by mouth every 6 (six) hours as needed for indigestion or diarrhea or loose stools.   Cholecalciferol (VITAMIN D3) 1.25 MG (50000 UT) CAPS Take 1 capsule by mouth once a week. Sunday   clotrimazole-betamethasone (LOTRISONE) cream Apply 1 Application topically daily.   [employer] VITAMIN B12 1000 MCG tablet Take 2,000 mcg by mouth daily.   hydrALAZINE (APRESOLINE) 10 MG tablet Take 1 tablet (10 mg total) by mouth 3 (three) times daily as needed (for elevated blood pressure (top number more than 140, bottom number more than 90)).   hydrOXYzine (ATARAX) 25 MG tablet Take 1 tablet (25 mg total) by mouth every 8 (eight) hours as needed for itching.   levothyroxine (SYNTHROID) 175 MCG tablet Take 175 mcg by mouth daily before breakfast.   nystatin cream (MYCOSTATIN)  Apply to affected area 2 times daily   ondansetron (ZOFRAN-ODT) 4 MG disintegrating tablet 4mg  ODT q4 hours prn nausea/vomit   pantoprazole (PROTONIX) 40 MG tablet Take 1 tablet (40 mg total) by mouth daily.   potassium chloride SA (KLOR-CON M) 20 MEQ tablet Take 1 tablet (20 mEq total) by  mouth daily.   ciprofloxacin (CIPRO) 500 MG tablet Take 1 tablet (500 mg total) by mouth every 12 (twelve) hours. (Patient not taking: Reported on 07/10/2023)   No facility-administered encounter medications on file as of 07/10/2023.    Past Medical History:  Diagnosis Date   Anemia    Anxiety    Asthma    B12 deficiency    Bursitis    Chronic LBP    Chronic UTI    Diastolic dysfunction    Fibromyalgia    GERD (gastroesophageal reflux disease)    Hearing impaired person, bilateral    per pt   History of low potassium    Hyperlipemia    Hypertension    Hypothyroid    IBS (irritable bowel syndrome)    Inflammatory osteoarthritis    Kidney infection    Psoriatic arthritis (HCC)    S/P cardiac cath 11/30/2007   normal coronaries - Dr. Excell Seltzer (Dr. Rennis Golden reviewed films on 01/10/2016)    Past Surgical History:  Procedure Laterality Date   CESAREAN SECTION  06/10/1975   CHOLECYSTECTOMY     LUMBAR LAMINECTOMY/DECOMPRESSION MICRODISCECTOMY Right 05/25/2020   Procedure: MICRODISCECTOMY Right Lumbar three - four;  Surgeon: Tressie Stalker, MD;  Location: [facility] OR;  Service: Neurosurgery;  Laterality: Right;   NECK SURGERY  1998   RADIOLOGY WITH ANESTHESIA N/A 05/16/2020   Procedure: MRI LUMBAR SPINE W/CONTRAST;  Surgeon: Radiologist, Medication, MD;  Location: [facility] OR;  Service: Radiology;  Laterality: N/A;   repair broken C6 & C7      Family History  Problem Relation Age of Onset   Cancer Mother        oral   Stroke Mother    Alzheimer's disease Mother        at 10   Heart attack Father 66   Coronary artery disease Brother    Heart attack Brother    Brain cancer Maternal Grandmother    Congestive Heart Failure Maternal Grandfather    Heart disease Maternal Grandfather    Heart attack Paternal Grandfather    Cancer Maternal Aunt        breast   Alcohol abuse Maternal Aunt    Alcohol abuse Maternal Uncle    Cancer Maternal Uncle        lung, stomach, oral   Cancer Paternal  Uncle        lung   Cancer Paternal Uncle        bone   Lung cancer Other        uncle, non smoker   Leukemia Other        uncle   Alzheimer's disease Other        maternal great grandmother    Colon cancer Neg Hx    Breast cancer Neg Hx    Diabetes Neg Hx     Social History   Socioeconomic History   Marital status: Widowed    Spouse name: Not on file   Number of children: 2   Years of education: Not on file   Highest education level: Bachelor's degree (e.g., BA, AB, BS)  Occupational History   Not on file  Tobacco Use   Smoking  status: Never    Passive exposure: Never   Smokeless tobacco: Never  Vaping Use   Vaping status: Never Used  Substance and Sexual Activity   Alcohol use: Never   Drug use: No   Sexual activity: Not Currently    Birth control/protection: Post-menopausal  Other Topics Concern   Not on file  Social History Narrative   Lost husband 2009, lost mother 2016      Lives at home (son & 3 grandchildren live with her)   Caffeine: 10-12 oz daily   Social Drivers of Corporate investment banker Strain: Not on file  Food Insecurity: Not on file  Transportation Needs: Not on file  Physical Activity: Not on file  Stress: Not on file  Social Connections: Not on file  Intimate Partner Violence: Not on file    Review of Systems  Constitutional:  Negative for fever and malaise/fatigue.  HENT:  Positive for ear pain. Negative for congestion.   Eyes:  Negative for blurred vision.  Respiratory:  Negative for cough and shortness of breath.   Cardiovascular:  Negative for chest pain, palpitations and leg swelling.  Gastrointestinal:  Positive for heartburn. Negative for vomiting.  Musculoskeletal:  Negative for back pain.  Skin:  Negative for rash.  Neurological:  Negative for loss of consciousness and headaches.        Objective    BP 118/80 (BP Location: Left Arm, Patient Position: Sitting, Cuff Size: Normal)   Pulse (!) 120   Temp 97.9 F  (36.6 C) (Oral)   Resp 18   Ht 5\' 4"  (1.626 m)   Wt 166 lb 3.2 oz (75.4 kg)   SpO2 98%   BMI 28.53 kg/m   Physical Exam Vitals and nursing note reviewed.  Constitutional:      General: She is not in acute distress.    Appearance: Normal appearance. She is well-developed.  HENT:     Head: Normocephalic and atraumatic.     Right Ear: Tympanic membrane, ear canal and external ear normal. Decreased hearing noted. There is no impacted cerumen.     Left Ear: Tympanic membrane, ear canal and external ear normal. Decreased hearing noted. There is no impacted cerumen.     Nose: Nose normal.     Mouth/Throat:     Mouth: Mucous membranes are moist.     Pharynx: Oropharynx is clear. No oropharyngeal exudate or posterior oropharyngeal erythema.  Eyes:     General: No scleral icterus.       Right eye: No discharge.        Left eye: No discharge.     Conjunctiva/sclera: Conjunctivae normal.     Pupils: Pupils are equal, round, and reactive to light.  Neck:     Thyroid: No thyromegaly or thyroid tenderness.     Vascular: No JVD.  Cardiovascular:     Rate and Rhythm: Normal rate and regular rhythm.     Heart sounds: Normal heart sounds. No murmur heard. Pulmonary:     Effort: Pulmonary effort is normal. No respiratory distress.     Breath sounds: Normal breath sounds.  Abdominal:     General: Bowel sounds are normal. There is no distension.     Palpations: Abdomen is soft. There is no mass.     Tenderness: There is no abdominal tenderness. There is no guarding or rebound.  Genitourinary:    Vagina: Normal.  Musculoskeletal:        General: Normal range of motion.  Cervical back: Normal range of motion and neck supple.     Right lower leg: No edema.     Left lower leg: No edema.  Lymphadenopathy:     Cervical: No cervical adenopathy.  Skin:    General: Skin is warm and dry.     Findings: No erythema or rash.  Neurological:     Mental Status: She is alert and oriented to person,  place, and time.     Cranial Nerves: No cranial nerve deficit.     Deep Tendon Reflexes: Reflexes are normal and symmetric.  Psychiatric:        Mood and Affect: Mood normal.        Behavior: Behavior normal.        Thought Content: Thought content normal.        Judgment: Judgment normal.     Last CBC Lab Results  Component Value Date   WBC 10.5 07/10/2023   HGB 15.2 (H) 07/10/2023   HCT 46.2 (H) 07/10/2023   MCV 95.6 07/10/2023   MCH 31.0 06/08/2023   RDW 13.7 07/10/2023   PLT 323.0 07/10/2023   Last metabolic panel Lab Results  Component Value Date   GLUCOSE 105 (H) 07/10/2023   NA 141 07/10/2023   K 3.6 07/10/2023   CL 108 07/10/2023   CO2 24 07/10/2023   BUN 11 07/10/2023   CREATININE 0.89 07/10/2023   GFR 62.23 07/10/2023   CALCIUM 9.8 07/10/2023   PHOS 2.8 05/13/2020   PROT 6.8 07/10/2023   ALBUMIN 4.3 07/10/2023   LABGLOB 2.5 10/22/2022   AGRATIO 1.7 03/27/2021   BILITOT 0.6 07/10/2023   ALKPHOS 100 07/10/2023   AST 12 07/10/2023   ALT 7 07/10/2023   ANIONGAP 9 06/08/2023   Last lipids Lab Results  Component Value Date   CHOL 199 07/10/2023   HDL 52.20 07/10/2023   LDLCALC 111 (H) 07/10/2023   LDLDIRECT 144 (H) 10/22/2022   TRIG 178.0 (H) 07/10/2023   CHOLHDL 4 07/10/2023   Last hemoglobin A1c Lab Results  Component Value Date   HGBA1C 5.6 11/02/2018   Last thyroid functions Lab Results  Component Value Date   TSH 0.02 (L) 07/10/2023   T3TOTAL 116 07/28/2018   T4TOTAL 11.0 07/07/2019   Last vitamin D Lab Results  Component Value Date   VD25OH 49.23 07/10/2023   Last vitamin B12 and Folate Lab Results  Component Value Date   VITAMINB12 610 07/10/2023   FOLATE 12.7 11/10/2019        Assessment & Plan:   Problem List Items Addressed This Visit       Unprioritized   Vitamin D deficiency (Chronic)   Relevant Orders   VITAMIN D 25 Hydroxy (Vit-D Deficiency, Fractures) (Completed)   Vitamin B12 deficiency   Relevant Orders    Vitamin B12 (Completed)   GERD (Chronic)   Relevant Medications   pantoprazole (PROTONIX) 40 MG tablet   Urine leukocytes increased   Relevant Orders   Urine Culture   Renal insufficiency   Recheck labs       Recurrent UTI   Check ua / culture  Treat if +      Relevant Medications   clotrimazole-betamethasone (LOTRISONE) cream   Other Relevant Orders   CBC with Differential/Platelet (Completed)   Comprehensive metabolic panel with GFR (Completed)   TSH (Completed)   POCT Urinalysis Dipstick (Automated) (Completed)   Ambulatory referral to Urology   Urine Culture   Rash - Primary   Relevant Medications  clotrimazole-betamethasone (LOTRISONE) cream   Other Relevant Orders   Ambulatory referral to Dermatology   Primary osteoarthritis involving multiple joints   Con't tylenol as needed       Neuropathic pain of both legs   Pt is requesting a neuro referral       Relevant Orders   Ambulatory referral to Neurology   Left ear pain   Relevant Medications   Azelastine-Fluticasone 137-50 MCG/ACT SUSP   Intractable chronic post-traumatic headache   Relevant Orders   Ambulatory referral to Neurology   Hypothyroidism   Check labs  Con't synthroid       Relevant Orders   Lipid panel (Completed)   TSH (Completed)   Hypomagnesemia   Recheck labs       Relevant Orders   Magnesium (Completed)   Hyperlipidemia (Chronic)   Tolerating statin, encouraged heart healthy diet, avoid trans fats, minimize simple carbs and saturated fats. Increase exercise as tolerated       Essential (primary) hypertension   Con't current meds  Con't hydralazine and amlodipine       Coronary artery disease involving native heart without angina pectoris (Chronic)   Per cardiology      Class 2 severe obesity due to excess calories with serious comorbidity and body mass index (BMI) of 38.0 to 38.9 in adult [facility])   Pt goes to weight loss program      Assessment and Plan Assessment &  Plan Recurrent Urinary Tract Infections (UTIs)   She experiences recurrent UTIs with symptoms starting two days ago, occurring approximately every thirty days, often necessitating urgent care visits. There are concerns about potential kidney involvement due to past sepsis. Previous urologist experience was unsatisfactory, prompting further evaluation. Order urinalysis and refer to a nephrologist for further evaluation of recurrent UTIs and potential kidney involvement.  Chronic Kidney Disease (CKD)   Stage 3 CKD was diagnosed years ago with no recent follow-up or confirmation of current kidney function status. Order blood work to assess kidney function.  Rash with possible fungal infection   A persistent rash responds to prednisone but recurs after cessation. Previous treatments include nystatin and over-the-counter antifungal creams. The skin appears like crepe paper, possibly due to prolonged steroid use. A referral to a dermatologist is requested for further evaluation. Prescribe Lotrisone cream and refer to a dermatologist.  Hearing impairment with ear pain and popping   She experiences left ear pain and popping when chewing or swallowing, possibly due to fluid in the ear. No visible infection is present on examination. Nasal congestion may be contributing to symptoms. Prescribe nasal spray to alleviate nasal congestion and potentially clear ear fluid.  Headaches and GERD   Frequent headaches, especially postprandially, may be related to GERD. GERD was diagnosed without endoscopy. Previous medication was ineffective, and a new prescription is provided. Prescribe medication for GERD.  Neuropathy   Neuropathy symptoms may be related to past cellulitis and monoclonal antibody infusion. She requests a referral to a neurologist for further evaluation. Refer to a neurologist for neuropathy evaluation.  History of Concussion   A fall resulted in facial injury and possible concussion. A CT scan  showed no significant findings. Symptoms of tenderness around the right eye persist, but the concussion should be resolved by now.  General Health Maintenance   There are concerns about memory and cognitive function, possibly related to stress and recent family losses. She engages in activities to maintain cognitive function. Order a complete blood panel to assess overall health.  Follow-up   She requires follow-up for multiple health concerns including UTIs, kidney function, rash, and neuropathy. Referrals to a nephrologist, dermatologist, and neurologist have been made. Follow up with these specialists as per referrals and perform blood work and urinalysis as ordered.    Return in about 3 months (around 10/09/2023), or if symptoms worsen or fail to improve.   Donato Schultz, DO

## 2023-07-10 NOTE — Assessment & Plan Note (Signed)
 Pt goes to weight loss program

## 2023-07-10 NOTE — Assessment & Plan Note (Signed)
Check labs  Con't synthroid 

## 2023-07-10 NOTE — Assessment & Plan Note (Signed)
 Con't current meds  Con't hydralazine and amlodipine

## 2023-07-10 NOTE — Patient Instructions (Signed)

## 2023-07-11 ENCOUNTER — Ambulatory Visit: Payer: Self-pay

## 2023-07-11 ENCOUNTER — Other Ambulatory Visit: Payer: Self-pay | Admitting: Family Medicine

## 2023-07-11 DIAGNOSIS — R300 Dysuria: Secondary | ICD-10-CM

## 2023-07-11 MED ORDER — CEPHALEXIN 500 MG PO CAPS: 500.0000 mg | ORAL_CAPSULE | Freq: Two times a day (BID) | ORAL | 0 refills | Status: DC

## 2023-07-11 NOTE — Telephone Encounter (Signed)
 Pt was seen in office yesterday for dysuria, culture results are still pending. Pt reports she is still having a lot of pain and no improvement in symptoms and is concerned about going all weekend without those results. Pt is requesting provider send abx to pharmacy while awaiting results. This RN educated pt on home care, new-worsening symptoms, when to call back/seek emergent care. Pt verbalized understanding and agrees to plan.    Copied from CRM [id number]. Topic: Clinical - Red Word Triage >> Jul 11, 2023 10:33 AM Cammy Copa D wrote: Red Word that prompted transfer to Nurse Triage: UTI symptoms, Pain/burning. pt there yesterday was going to do urinalysis, blood in urine , they did urinalysis and didn't receive. Reason for Disposition  Caller requesting lab results  (Exception: Routine or non-urgent lab result.)  Answer Assessment - Initial Assessment Questions 1. REASON FOR CALL or QUESTION: "What is your reason for calling today?" or "How can I best help you?" or "What question do you have that I can help answer?"     Urine Culture Results 2. CALLER: Document the source of call. (e.g., laboratory, patient).     Patient  Protocols used: PCP Call - No Triage-A-AH

## 2023-07-11 NOTE — Telephone Encounter (Signed)
   Chief Complaint: UTI symptoms  Symptoms: burning, pain   Disposition: [] ED /[x] Urgent Care (no appt availability in office) / [] Appointment(In office/virtual)/ []  [facility]/ [] Home Care/ [x] Refused Recommended Disposition /[]  Mobile Bus/ []  Follow-up with PCP Additional Notes: Pt calling with follow-up from appt yesterday. Pt wanted to know results of urinalysis. Pt has recurrent UTI with chronic kidney disease. Pt states it burns when urinating  and has pain. Pt is asking if Keflex can be called in until results are in. RN explained it was end of day and request may not be seen.  RN advised pt to go to UC so she can get medication today. Pt stated she doesn't have transportation and will wait for office to notify her. Please call and update in Mychart if pt's request can be filled. RN gave care advice and pt verbalized understanding.            Copied from CRM [id number]. Topic: Clinical - Medication Question >> Jul 11, 2023  3:41 PM Saverio Danker wrote: Reason for CRM: Patient is calling in because she needs medication as soon as possible. Would like to know if her results from the urinalysis are not back can she be prescribed something as soon as possible. Reason for Disposition  Side (flank) or lower back pain present  Answer Assessment - Initial Assessment Questions 1. SYMPTOM: "What's the main symptom you're concerned about?" (e.g., frequency, incontinence)     Pain and burning  2. ONSET: "When did the   start?"     Wednesday  3. PAIN: "Is there any pain?" If Yes, ask: "How bad is it?" (Scale: 1-10; mild, moderate, severe)     Takes tylenol  4. CAUSE: "What do you think is causing the symptoms?"     UTI 5. OTHER SYMPTOMS: "Do you have any other symptoms?" (e.g., blood in urine, fever, flank pain, pain with urination)     Blood in urine  Protocols used: Urinary Symptoms-A-AH

## 2023-07-12 ENCOUNTER — Encounter: Payer: Self-pay | Admitting: Family Medicine

## 2023-07-12 LAB — URINE CULTURE
MICRO NUMBER:: 16285303
SPECIMEN QUALITY:: ADEQUATE

## 2023-07-14 NOTE — Telephone Encounter (Signed)
 Rx was sent by PCP over weekend.

## 2023-07-15 ENCOUNTER — Telehealth: Payer: Self-pay | Admitting: Emergency Medicine

## 2023-07-15 NOTE — Telephone Encounter (Signed)
 Copied from CRM [id number]. Topic: General - Other >> Jul 15, 2023  2:09 PM Rodman Pickle T wrote: Reason for CRM: patient would like a appt to a new neurologist the other one she was referred to was not pleasant to her she does not want to go back

## 2023-07-15 NOTE — Telephone Encounter (Signed)
Do we need to place a new referral ?

## 2023-07-17 ENCOUNTER — Telehealth: Payer: Self-pay

## 2023-07-17 ENCOUNTER — Other Ambulatory Visit (HOSPITAL_COMMUNITY): Payer: Self-pay

## 2023-07-17 ENCOUNTER — Encounter: Payer: Self-pay | Admitting: Neurology

## 2023-07-17 NOTE — Telephone Encounter (Signed)
 Pharmacy Patient Advocate Encounter   Received notification from RX Request Messages that prior authorization for Azelastine-Fluticasone 137-50MCG/ACT suspension is required/requested.   Insurance verification completed.   The patient is insured through [employer] .   Per test claim: PA required; PA submitted to above mentioned insurance via CoverMyMeds Key/confirmation #/EOC [id number] Status is pending

## 2023-07-17 NOTE — Telephone Encounter (Signed)
 Pharmacy Patient Advocate Encounter  Received notification from [facility] that Prior Authorization for Azelastine-Fluticasone 137-50MCG/ACT suspension has been APPROVED from 04/09/2023 to 04/07/2024   PA #/Case ID/Reference #: [id number]

## 2023-07-17 NOTE — Telephone Encounter (Signed)
PA has been approved, please sign off on rx in this encounter as PA team is unable to resolve RX requests. Thank you

## 2023-07-21 ENCOUNTER — Telehealth (HOSPITAL_BASED_OUTPATIENT_CLINIC_OR_DEPARTMENT_OTHER): Payer: Self-pay | Admitting: Cardiology

## 2023-07-21 NOTE — Telephone Encounter (Signed)
 Please review and advise for any indications between medications?

## 2023-07-21 NOTE — Telephone Encounter (Signed)
 Pt c/o medication issue:  1. Name of Medication:   amLODipine (NORVASC) 5 MG tablet   2. How are you currently taking this medication (dosage and times per day)?   As prescribed  3. Are you having a reaction (difficulty breathing--STAT)?   4. What is your medication issue?    Patient stated she has been constipated and took Miralax but noted on the bottle that as she is taking this medication she is concerned if this medication is safe for her to take.  Patient stated can respond in MyChart as she has a hard time hearing on her phone.

## 2023-08-03 ENCOUNTER — Emergency Department (HOSPITAL_COMMUNITY)

## 2023-08-03 ENCOUNTER — Emergency Department (HOSPITAL_COMMUNITY)
Admission: EM | Admit: 2023-08-03 | Discharge: 2023-08-03 | Disposition: A | Discharge status: Home / Self Care | Attending: Emergency Medicine | Admitting: Emergency Medicine

## 2023-08-03 ENCOUNTER — Encounter (HOSPITAL_COMMUNITY): Payer: Self-pay | Admitting: Emergency Medicine

## 2023-08-03 ENCOUNTER — Other Ambulatory Visit: Payer: Self-pay

## 2023-08-03 DIAGNOSIS — R110 Nausea: Secondary | ICD-10-CM | POA: Diagnosis not present

## 2023-08-03 DIAGNOSIS — E876 Hypokalemia: Secondary | ICD-10-CM | POA: Insufficient documentation

## 2023-08-03 DIAGNOSIS — K5730 Diverticulosis of large intestine without perforation or abscess without bleeding: Secondary | ICD-10-CM | POA: Diagnosis not present

## 2023-08-03 DIAGNOSIS — R531 Weakness: Secondary | ICD-10-CM | POA: Diagnosis not present

## 2023-08-03 DIAGNOSIS — K5900 Constipation, unspecified: Secondary | ICD-10-CM | POA: Diagnosis not present

## 2023-08-03 DIAGNOSIS — K5792 Diverticulitis of intestine, part unspecified, without perforation or abscess without bleeding: Secondary | ICD-10-CM | POA: Diagnosis not present

## 2023-08-03 DIAGNOSIS — K5712 Diverticulitis of small intestine without perforation or abscess without bleeding: Secondary | ICD-10-CM | POA: Diagnosis not present

## 2023-08-03 DIAGNOSIS — R109 Unspecified abdominal pain: Secondary | ICD-10-CM | POA: Diagnosis not present

## 2023-08-03 LAB — COMPREHENSIVE METABOLIC PANEL WITH GFR
ALT: 9 U/L (ref 0–44)
AST: 18 U/L (ref 15–41)
Albumin: 3 g/dL — ABNORMAL LOW (ref 3.5–5.0)
Alkaline Phosphatase: 68 U/L (ref 38–126)
Anion gap: 7 (ref 5–15)
BUN: 12 mg/dL (ref 8–23)
CO2: 19 mmol/L — ABNORMAL LOW (ref 22–32)
Calcium: 7.9 mg/dL — ABNORMAL LOW (ref 8.9–10.3)
Chloride: 114 mmol/L — ABNORMAL HIGH (ref 98–111)
Creatinine, Ser: 0.58 mg/dL (ref 0.44–1.00)
GFR, Estimated: >60 mL/min (ref >60)
Glucose, Bld: 93 mg/dL (ref 70–99)
Potassium: 3 mmol/L — ABNORMAL LOW (ref 3.5–5.1)
Sodium: 140 mmol/L (ref 135–145)
Total Bilirubin: 0.7 mg/dL (ref 0.0–1.2)
Total Protein: 5.4 g/dL — ABNORMAL LOW (ref 6.5–8.1)

## 2023-08-03 LAB — CBC WITH DIFFERENTIAL/PLATELET
Abs Immature Granulocytes: 0.04 10*3/uL (ref 0.00–0.07)
Basophils Absolute: 0 10*3/uL (ref 0.0–0.1)
Basophils Relative: 0 %
Eosinophils Absolute: 0 10*3/uL (ref 0.0–0.5)
Eosinophils Relative: 0 %
HCT: 42.4 % (ref 36.0–46.0)
Hemoglobin: 13.7 g/dL (ref 12.0–15.0)
Immature Granulocytes: 0 %
Lymphocytes Relative: 4 %
Lymphs Abs: 0.6 10*3/uL — ABNORMAL LOW (ref 0.7–4.0)
MCH: 31.4 pg (ref 26.0–34.0)
MCHC: 32.3 g/dL (ref 30.0–36.0)
MCV: 97 fL (ref 80.0–100.0)
Monocytes Absolute: 1.2 10*3/uL — ABNORMAL HIGH (ref 0.1–1.0)
Monocytes Relative: 9 %
Neutro Abs: 12.4 10*3/uL — ABNORMAL HIGH (ref 1.7–7.7)
Neutrophils Relative %: 87 %
Platelets: 250 10*3/uL (ref 150–400)
RBC: 4.37 MIL/uL (ref 3.87–5.11)
RDW: 12.8 % (ref 11.5–15.5)
WBC: 14.3 10*3/uL — ABNORMAL HIGH (ref 4.0–10.5)
nRBC: 0 % (ref 0.0–0.2)

## 2023-08-03 LAB — I-STAT CG4 LACTIC ACID, ED: Lactic Acid, Venous: 1 mmol/L (ref 0.5–1.9)

## 2023-08-03 LAB — LIPASE, BLOOD: Lipase: 30 U/L (ref 11–51)

## 2023-08-03 MED ORDER — AMOXICILLIN-POT CLAVULANATE 875-125 MG PO TABS: 1.0000 | ORAL_TABLET | Freq: Two times a day (BID) | ORAL | 0 refills | Status: DC

## 2023-08-03 MED ORDER — AMOXICILLIN-POT CLAVULANATE 875-125 MG PO TABS
1.0000 | ORAL_TABLET | Freq: Once | ORAL | Status: AC
  Administered 2023-08-03: 1 via ORAL
  Filled 2023-08-03: qty 1

## 2023-08-03 MED ORDER — ACETAMINOPHEN 325 MG PO TABS
650.0000 mg | ORAL_TABLET | Freq: Once | ORAL | Status: AC
  Administered 2023-08-03: 650 mg via ORAL
  Filled 2023-08-03: qty 2

## 2023-08-03 MED ORDER — ONDANSETRON 4 MG PO TBDP: 4.0000 mg | ORAL_TABLET | Freq: Three times a day (TID) | ORAL | 0 refills | Status: DC | PRN

## 2023-08-03 MED ORDER — SODIUM CHLORIDE 0.9 % IV BOLUS
500.0000 mL | Freq: Once | INTRAVENOUS | Status: AC
  Administered 2023-08-03: 500 mL via INTRAVENOUS

## 2023-08-03 MED ORDER — SODIUM CHLORIDE (PF) 0.9 % IJ SOLN
INTRAMUSCULAR | Status: AC
  Filled 2023-08-03: qty 50

## 2023-08-03 MED ORDER — POTASSIUM CHLORIDE CRYS ER 20 MEQ PO TBCR
40.0000 meq | EXTENDED_RELEASE_TABLET | Freq: Once | ORAL | Status: AC
  Administered 2023-08-03: 40 meq via ORAL
  Filled 2023-08-03: qty 2

## 2023-08-03 MED ORDER — ONDANSETRON HCL 4 MG/2ML IJ SOLN
4.0000 mg | Freq: Once | INTRAMUSCULAR | Status: AC
  Administered 2023-08-03: 4 mg via INTRAVENOUS
  Filled 2023-08-03: qty 2

## 2023-08-03 MED ORDER — IOHEXOL 300 MG/ML  SOLN
100.0000 mL | Freq: Once | INTRAMUSCULAR | Status: AC | PRN
  Administered 2023-08-03: 100 mL via INTRAVENOUS

## 2023-08-03 NOTE — ED Triage Notes (Signed)
  Patient BIB EMS for constipation that has been going on for 2 weeks.  Patient states she checked her temp at home and was worried she was septic based on 92 degree temp, 98.1 on arrival.  Told to take miralax  for constipation but states she did not take it.  Denies any N/V.  States she frequently gets UTIs but denies any symptoms at this time.  Pain 6/10, neuropathy in bilateral legs.

## 2023-08-03 NOTE — ED Provider Notes (Signed)
 Kayla [name] Provider Note   CSN: [id number] Arrival date & time: 08/03/23  0256     History  Chief Complaint  Patient presents with   Constipation    Kayla [name] is a 78 y.o. female.  The history is provided by the patient, the EMS personnel and medical records.  Constipation Kayla [name] is a 78 y.o. female who presents to the Emergency Department complaining of low temp.  She presents to the emergency department by EMS due to concern for developing infection.  She felt very cold at home with associated chills and took her temperature and it was 92 the first time at 93 the second time, which prompted EMS call.  She does complain of several days of abdominal bloating, constipation and diffuse abdominal pain.  She has nausea but no vomiting.  She also reports associated dysuria.  She has a history of recurrent UTIs, hypertension Lives at home with son.  Home Medications Prior to Admission medications   Medication Sig Start Date End Date Taking? Authorizing Provider  amoxicillin -clavulanate (AUGMENTIN ) 875-125 MG tablet Take 1 tablet by mouth every 12 (twelve) hours. 08/03/23  Yes Kelsey Patricia, MD  ondansetron  (ZOFRAN -ODT) 4 MG disintegrating tablet Take 1 tablet (4 mg total) by mouth every 8 (eight) hours as needed. 08/03/23  Yes Kelsey Patricia, MD  acetaminophen  (TYLENOL ) 500 MG tablet Take 1,000 mg by mouth in the morning, at noon, in the evening, and at bedtime.    [provider]  amLODipine  (NORVASC ) 5 MG tablet Take 1 tablet (5 mg total) by mouth daily. 11/15/22   Sheryle Donning, MD  Azelastine -Fluticasone  137-50 MCG/ACT SUSP Place 1 spray into the nose every 12 (twelve) hours. 07/10/23   Lowne Chase, Yvonne R, DO  bismuth subsalicylate (PEPTO BISMOL) 262 MG/15ML suspension Take 30 mLs by mouth every 6 (six) hours as needed for indigestion or diarrhea or loose stools.    [provider]  cephALEXin  (KEFLEX ) 500 MG  capsule Take 1 capsule (500 mg total) by mouth 2 (two) times daily. 07/11/23   Lowne Chase, Yvonne R, DO  Cholecalciferol (VITAMIN D3) 1.25 MG (50000 UT) CAPS Take 1 capsule by mouth once a week. Sunday 07/22/22   [provider]  ciprofloxacin  (CIPRO ) 500 MG tablet Take 1 tablet (500 mg total) by mouth every 12 (twelve) hours. Patient not taking: Reported on 07/10/2023 04/08/23   Vernestine Gondola, PA-C  clotrimazole -betamethasone  (LOTRISONE ) cream Apply 1 Application topically daily. 07/10/23   Lowne Chase, Yvonne R, DO  [facility] VITAMIN B12 1000 MCG tablet Take 2,000 mcg by mouth daily.    [provider]  hydrALAZINE  (APRESOLINE ) 10 MG tablet Take 1 tablet (10 mg total) by mouth 3 (three) times daily as needed (for elevated blood pressure (top number more than 140, bottom number more than 90)). 02/14/23   Sheryle Donning, MD  hydrOXYzine  (ATARAX ) 25 MG tablet Take 1 tablet (25 mg total) by mouth every 8 (eight) hours as needed for itching. 04/27/23   Levora Reas A, NP  levothyroxine  (SYNTHROID ) 175 MCG tablet Take 175 mcg by mouth daily before breakfast.    [provider]  nystatin  cream (MYCOSTATIN ) Apply to affected area 2 times daily 04/27/23   Levora Reas A, NP  pantoprazole  (PROTONIX ) 40 MG tablet Take 1 tablet (40 mg total) by mouth daily. 07/10/23   Lowne Chase, Yvonne R, DO  potassium chloride  SA (KLOR-CON  M) 20 MEQ tablet Take 1 tablet (20 mEq  total) by mouth daily. 06/08/23   Dalene Duck, MD      Allergies    Ace inhibitors, Lisinopril, and Ciprofloxacin     Review of Systems   Review of Systems  Gastrointestinal:  Positive for constipation.  All other systems reviewed and are negative.   Physical Exam Updated Vital Signs BP 123/75   Pulse 96   Temp 98.2 F (36.8 C) (Oral)   Resp 18   Ht 5\' 4"  (1.626 m)   Wt 75.3 kg   SpO2 95%   BMI 28.49 kg/m  Physical Exam Vitals and nursing note reviewed.  Constitutional:      Appearance: She is  well-developed.  HENT:     Head: Normocephalic and atraumatic.  Cardiovascular:     Rate and Rhythm: Regular rhythm. Tachycardia present.  Pulmonary:     Effort: Pulmonary effort is normal. No respiratory distress.  Abdominal:     Palpations: Abdomen is soft.     Tenderness: There is no guarding or rebound.     Comments: Moderate generalized abdominal tenderness  Musculoskeletal:        General: No tenderness.     Comments: Trace pitting edema to BLE with 2+ DP pulses bilaterally.   Skin:    General: Skin is warm and dry.  Neurological:     Mental Status: She is alert and oriented to person, place, and time.  Psychiatric:        Behavior: Behavior normal.     ED Results / Procedures / Treatments   Labs (all labs ordered are listed, but only abnormal results are displayed) Labs Reviewed  COMPREHENSIVE METABOLIC PANEL WITH GFR - Abnormal; Notable for the following components:      Result Value   Potassium 3.0 (*)    Chloride 114 (*)    CO2 19 (*)    Calcium  7.9 (*)    Total Protein 5.4 (*)    Albumin 3.0 (*)    All other components within normal limits  CBC WITH DIFFERENTIAL/PLATELET - Abnormal; Notable for the following components:   WBC 14.3 (*)    Neutro Abs 12.4 (*)    Lymphs Abs 0.6 (*)    Monocytes Absolute 1.2 (*)    All other components within normal limits  LIPASE, BLOOD  URINALYSIS, W/ REFLEX TO CULTURE (INFECTION SUSPECTED)  I-STAT CG4 LACTIC ACID, ED    EKG None  Radiology CT ABDOMEN PELVIS W CONTRAST Result Date: 08/03/2023 CLINICAL DATA:  Acute, nonlocalized abdominal pain. Constipation for 2 weeks EXAM: CT ABDOMEN AND PELVIS WITH CONTRAST TECHNIQUE: Multidetector CT imaging of the abdomen and pelvis was performed using the standard protocol following bolus administration of intravenous contrast. RADIATION DOSE REDUCTION: This exam was performed according to the departmental dose-optimization program which includes automated exposure control, adjustment  of the mA and/or kV according to patient size and/or use of iterative reconstruction technique. CONTRAST:  OMNIPAQUE  IOHEXOL  300 MG/ML  SOLN COMPARISON:  06/05/2023 FINDINGS: Lower chest:  No acute finding.  Cardiomegaly with atherosclerosis. Hepatobiliary: No focal liver abnormality.Cholecystectomy. No biliary dilatation. Pancreas: Unremarkable. Spleen: Unremarkable. Adrenals/Urinary Tract: Negative adrenals. No hydronephrosis or stone. Patchy renal cortical scarring bilaterally. Unremarkable bladder. 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 No abscess or pneumoperitoneum. Mention of constipation but no stool retention or rectal impaction. Distal colonic diverticulosis. Vascular/Lymphatic: No acute vascular abnormality. No mass  or adenopathy. Reproductive:4.6 cm left ovarian cyst with simple in stable CT appearance, previously evaluated by ultrasound in 2020. Other: No ascites or pneumoperitoneum. Small fatty umbilical hernia. Musculoskeletal: No acute abnormalities. Generalized lumbar spine degeneration with mild L4-5 and L5-S1 anterolisthesis. IMPRESSION: Recurrence of distal small bowel diverticulitis, focal small-bowel involvement and location suggesting Meckel's diverticulum. Chronic findings are stable and described above. Electronically Signed   By: Ronnette Coke M.D.   On: 08/03/2023 06:49    Procedures Procedures    Medications Ordered in ED Medications  amoxicillin -clavulanate (AUGMENTIN ) 875-125 MG per tablet 1 tablet (has no administration in time range)  potassium chloride  SA (KLOR-CON  M) CR tablet 40 mEq (has no administration in time range)  sodium chloride  0.9 % bolus 500 mL (0 mLs Intravenous Stopped 08/03/23 1610)  ondansetron  (ZOFRAN ) injection 4 mg (4 mg Intravenous Given 08/03/23 0508)  acetaminophen  (TYLENOL )  tablet 650 mg (650 mg Oral Given 08/03/23 0508)  iohexol  (OMNIPAQUE ) 300 MG/ML solution 100 mL (100 mLs Intravenous Contrast Given 08/03/23 9604)    ED Course/ Medical Decision Making/ A&P                                 Medical Decision Making Amount and/or Complexity of Data Reviewed Labs: ordered. Radiology: ordered.  Risk OTC drugs. Prescription drug management.   Patient here for evaluation of abdominal pain.  She does have generalized tenderness on examination without peritonitis.  CBC with leukocytosis.  There is no fever or hypothermia.  Lactic acid is within normal limits.  She was treated with pain medication, IV fluids with improvement in her symptoms.  Labs with hypokalemia, potassium replaced orally.  CT abdomen pelvis was obtained, which demonstrates mild diverticulitis, no evidence of abscess.  Patient believes she has a GI physician but is unsure who they are.  Discussed that she will need to follow-up with gastroenterology.  Will start on antibiotics given her leukocytosis and pain.  She refuses ciprofloxacin .  Will start on Augmentin .  Discussed outpatient follow-up as well as return precautions for progressive or new concerning symptoms.  Offered to contact family to update them on findings of studies and she declines.        Final Clinical Impression(s) / ED Diagnoses Final diagnoses:  Acute diverticulitis  Hypokalemia    Rx / DC Orders ED Discharge Orders          Ordered    amoxicillin -clavulanate (AUGMENTIN ) 875-125 MG tablet  Every 12 hours        08/03/23 0705    ondansetron  (ZOFRAN -ODT) 4 MG disintegrating tablet  Every 8 hours PRN        08/03/23 0705              Kelsey Patricia, MD 08/03/23 [phone]

## 2023-08-03 NOTE — ED Notes (Signed)
 Patient transported to CT

## 2023-08-05 ENCOUNTER — Telehealth: Payer: Self-pay

## 2023-08-05 NOTE — Telephone Encounter (Signed)
 Pt needs ED follow up please

## 2023-08-05 NOTE — Telephone Encounter (Signed)
 Copied from CRM [id number]. Topic: Clinical - Lab/Test Results >> Aug 05, 2023  2:24 PM Kayla [name] wrote: Reason for CRM: Patient called due to recent hospital visit and was Diagnosed with diverticulitis & is concerned about test results due to it showing possible kidney disease, would like to speak with Dr. Jalene Mayor or her nurse . Please call [phone]

## 2023-08-11 ENCOUNTER — Ambulatory Visit: Admitting: Family Medicine

## 2023-08-14 ENCOUNTER — Ambulatory Visit: Admitting: Family Medicine

## 2023-08-14 ENCOUNTER — Encounter: Payer: Self-pay | Admitting: Family Medicine

## 2023-08-14 VITALS — BP 130/80 | HR 99 | Temp 98.2°F | Resp 20 | Ht 64.0 in | Wt 157.0 lb

## 2023-08-14 DIAGNOSIS — R300 Dysuria: Secondary | ICD-10-CM

## 2023-08-14 DIAGNOSIS — R222 Localized swelling, mass and lump, trunk: Secondary | ICD-10-CM | POA: Diagnosis not present

## 2023-08-14 DIAGNOSIS — R21 Rash and other nonspecific skin eruption: Secondary | ICD-10-CM

## 2023-08-14 DIAGNOSIS — K5792 Diverticulitis of intestine, part unspecified, without perforation or abscess without bleeding: Secondary | ICD-10-CM

## 2023-08-14 LAB — POC URINALSYSI DIPSTICK (AUTOMATED)
Blood, UA: NEGATIVE
Glucose, UA: NEGATIVE
Nitrite, UA: NEGATIVE
Protein, UA: NEGATIVE
Spec Grav, UA: 1.01 (ref 1.010–1.025)
Urobilinogen, UA: 0.2 U/dL
pH, UA: 6 (ref 5.0–8.0)

## 2023-08-14 MED ORDER — CLOTRIMAZOLE-BETAMETHASONE 1-0.05 % EX CREA: 1.0000 | TOPICAL_CREAM | Freq: Every day | CUTANEOUS | 0 refills | Status: DC

## 2023-08-14 NOTE — Patient Instructions (Signed)
Diverticulitis  Diverticulitis happens when poop (stool) and bacteria get trapped in small pouches in the colon called diverticula. These pouches may form if you have a condition called diverticulosis. When the poop and bacteria get trapped, it can cause an infection and inflammation. Diverticulitis may cause severe stomach pain and diarrhea. It can also lead to tissue damage in your colon. This can cause bleeding or blockage. In some cases, the diverticula may burst (rupture). This can cause infected poop to go into other parts of your abdomen. What are the causes? This condition is caused by poop getting trapped in the diverticula. This allows bacteria to grow. It can lead to inflammation and infection. What increases the risk? You are more likely to get this condition if you have diverticulosis. You are also more at risk if: You are overweight or obese. You do not get enough exercise. You drink alcohol. You smoke. You eat a lot of red meat, such as beef, pork, or lamb. You do not get enough fiber. Foods high in fiber include fruits, vegetables, beans, nuts, and whole grains. You are over 35 years of age. What are the signs or symptoms? Symptoms of this condition may include: Pain and tenderness in the abdomen. This pain is often felt on the left side but may occur in other spots. Fever and chills. Nausea and vomiting. Cramping. Bloating. Changes in how often you poop. Blood in your poop. How is this diagnosed? This condition is diagnosed based on your medical history and a physical exam. You may also have tests done to make sure there is nothing else causing your condition. These tests may include: Blood tests. Tests done on your pee (urine). A CT scan of the abdomen. You may need to have a colonoscopy. This is an exam to look at your whole large intestine. During the exam, a tube is put into the opening of your butt (anus) and then moved into your rectum, colon, and other parts of  the large intestine. This exam is done to look at the diverticula. It can also see if there is something else that may be causing your symptoms. How is this treated? Most cases are mild and can be treated at home. You may be told to: Take over-the-counter pain medicine. Only eat and drink clear liquids. Take antibiotics. Rest. More severe cases may need to be treated at a hospital. Treatment may include: Not eating or drinking. Taking pain medicines. Getting antibiotics through an IV. Getting fluids and nutrition through an IV. Surgery. Follow these instructions at home: Medicines Take over-the-counter and prescription medicines only as told by your health care provider. These include fiber supplements, probiotics, and medicines to soften your poop (stool softeners). If you were prescribed antibiotics, take them as told by your provider. Do not stop using the antibiotic even if you start to feel better. Ask your provider if the medicine prescribed to you requires you to avoid driving or using machinery. Eating and drinking  Follow the diet told by your provider. You may need to only eat and drink liquids. After your symptoms get better, you may be able to return to a more normal diet. You may be told to eat at least 25 grams (25 g) of fiber each day. Fiber makes it easier to poop. Healthy sources of fiber include: Berries. One cup has 4-8 g of fiber. Beans or lentils. One-half cup has 5-8 g of fiber. Green vegetables. One cup has 4 g of fiber. Avoid eating red meat.  General instructions Do not use any products that contain nicotine or tobacco. These products include cigarettes, chewing tobacco, and vaping devices, such as e-cigarettes. If you need help quitting, ask your provider. Exercise for at least 30 minutes, 3 times a week. Exercise hard enough to raise your heart rate and break a sweat. Contact a health care provider if: Your pain gets worse. Your pooping does not go back to  normal. Your symptoms do not get better with treatment. Your symptoms get worse all of a sudden. You have a fever. You vomit more than one time. Your poop is bloody, black, or tarry. This information is not intended to replace advice given to you by your health care provider. Make sure you discuss any questions you have with your health care provider. Document Revised: 12/20/2021 Document Reviewed: 12/20/2021 Elsevier Patient Education  2024 [employer].

## 2023-08-14 NOTE — Progress Notes (Signed)
 Established Patient Office Visit  Subjective   Patient ID: Kayla [name], female    DOB: 04/30/1945  Age: 78 y.o. MRN: [id number]  Chief Complaint  Patient presents with   ED follow up    HPI Discussed the use of AI scribe software for clinical note transcription with the patient, who gave verbal consent to proceed.  History of Present Illness Kayla [name] is a 78 year old female with diverticulitis who presents with abdominal pain and stress-related symptoms.  She recently visited the emergency room where she was diagnosed with diverticulitis and treated with antibiotics, specifically mentioning 'sebulex'. She has a follow-up appointment with a gastroenterologist in a week or two. She describes ongoing abdominal tenderness, though not as severe as during the acute episode. No diarrhea, but she reports constipation and significant pain during the initial episode. She has a history of hiatal hernia and GERD.  She experiences recurrent urinary tract infections (UTIs), with symptoms of pain and burning, which she associates with wearing pads. She has an upcoming appointment with a urologist to address these issues. The pain and burning are not as severe at the moment.  She has a history of low potassium levels, which were noted to be low during her recent emergency room visit. She takes over-the-counter potassium supplements, 99 mg twice daily, but is concerned about her dietary intake and the adequacy of her supplementation. She recalls previous episodes where low potassium required infusion and was associated with severe symptoms, including back pain and sepsis.  She describes significant stress due to family issues, including the death of her brother and financial concerns related to her granddaughter's education. She feels overwhelmed by these stressors, which she believes may be impacting her health.  She reports generalized pain in her bones at night, affecting her sleep, and redness in her  feet and legs, which she finds concerning. She has a history of neuropathy, which she attributes to a past infusion of monoclonal antibodies during a COVID-19 infection, followed by emergency back surgery. She describes a history of blisters and cellulitis following the infusion, which she believes led to her neuropathy. She is scheduled to see a neurologist and a dermatologist for her ongoing symptoms, including skin sensitivity and neuropathy. She also mentions a painful knot in her abdomen that occasionally causes discomfort.   Patient Active Problem List   Diagnosis Date Noted   Rash 07/10/2023   Neuropathic pain of both legs 07/10/2023   Left ear pain 07/10/2023   Intractable chronic post-traumatic headache 07/10/2023   Hypomagnesemia 07/10/2023   Urine leukocytes increased 07/10/2023   Personal history of COVID-19 08/24/2020   Body mass index (BMI) 33.0-33.9, adult 06/20/2020   Status post lumbar microdiscectomy 06/20/2020   Essential (primary) hypertension 06/20/2020   Bilateral leg edema 06/17/2020   Cellulitis of leg, left 06/17/2020   Cellulitis of leg, right 06/17/2020   Lumbar herniated disc 05/25/2020   Hyponatremia 05/12/2020   Sciatica 05/12/2020   COVID-19 05/09/2020   Hypokalemia 05/04/2020   Dysphagia 04/17/2020   Renal insufficiency 12/29/2018   Cyst of left ovary 12/29/2018   Esophageal dysmotility 12/29/2018   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/29/2018   Hypothyroidism 12/29/2018   Chronic maxillary sinusitis 07/07/2018   Snoring 06/01/2018   Costochondritis 06/01/2018   Family history of heart disease 06/01/2018   Class 2 severe obesity due to excess calories with serious comorbidity and body mass index (BMI) of 38.0 to 38.9 in adult [facility]) 06/01/2018   Multiple atypical  skin moles 04/28/2017   Depression, recurrent (HCC) 07/16/2016   Adjustment disorder with mixed anxiety and depressed mood 01/15/2016   Fe Def Anemia 12/22/2015   Idiopathic  scoliosis and kyphoscoliosis 12/20/2015   Osteopenia 12/20/2015   Fibromyalgia 08/02/2014   GAD (generalized anxiety disorder) 09/17/2012   Recurrent UTI 01/24/2012   Vitamin D  deficiency 01/24/2012   Fatigue 10/14/2011   Vitamin B12 deficiency    Hyperlipidemia 11/30/2007   Coronary artery disease involving native heart without angina pectoris 11/30/2007   Essential hypertension 01/27/2007   GERD 01/27/2007   IBS 01/27/2007   Past Medical History:  Diagnosis Date   Anemia    Anxiety    Asthma    B12 deficiency    Bursitis    Chronic LBP    Chronic UTI    Diastolic dysfunction    Fibromyalgia    GERD (gastroesophageal reflux disease)    Hearing impaired person, bilateral    per pt   History of low potassium    Hyperlipemia    Hypertension    Hypothyroid    IBS (irritable bowel syndrome)    Inflammatory osteoarthritis    Kidney infection    Psoriatic arthritis (HCC)    S/P cardiac cath 11/30/2007   normal coronaries - Dr. Arlester Ladd (Dr. Maximo Spar reviewed films on 01/10/2016)   Past Surgical History:  Procedure Laterality Date   CESAREAN SECTION  06/10/1975   CHOLECYSTECTOMY     LUMBAR LAMINECTOMY/DECOMPRESSION MICRODISCECTOMY Right 05/25/2020   Procedure: MICRODISCECTOMY Right Lumbar three - four;  Surgeon: Garry Kansas, MD;  Location: [facility] OR;  Service: Neurosurgery;  Laterality: Right;   NECK SURGERY  1998   RADIOLOGY WITH ANESTHESIA N/A 05/16/2020   Procedure: MRI LUMBAR SPINE W/CONTRAST;  Surgeon: Radiologist, Medication, MD;  Location: [facility] OR;  Service: Radiology;  Laterality: N/A;   repair broken C6 & C7     Social History   Tobacco Use   Smoking status: Never    Passive exposure: Never   Smokeless tobacco: Never  Vaping Use   Vaping status: Never Used  Substance Use Topics   Alcohol use: Never   Drug use: No   Social History   Socioeconomic History   Marital status: Widowed    Spouse name: Not on file   Number of children: 2   Years of education: Not  on file   Highest education level: Bachelor's degree (e.g., BA, AB, BS)  Occupational History   Not on file  Tobacco Use   Smoking status: Never    Passive exposure: Never   Smokeless tobacco: Never  Vaping Use   Vaping status: Never Used  Substance and Sexual Activity   Alcohol use: Never   Drug use: No   Sexual activity: Not Currently    Birth control/protection: Post-menopausal  Other Topics Concern   Not on file  Social History Narrative   Lost husband 2009, lost mother 2016      Lives at home (son & 3 grandchildren live with her)   Caffeine: 10-12 oz daily   Social Drivers of Corporate investment banker Strain: Not on file  Food Insecurity: Not on file  Transportation Needs: Not on file  Physical Activity: Not on file  Stress: Not on file  Social Connections: Not on file  Intimate Partner Violence: Not on file   Family Status  Relation Name Status   Mother  Deceased at age 41   Father  Deceased at age 74   Brother  Deceased  MGM  Deceased   MGF  Deceased   PGM  Deceased   PGF  Deceased   Mat Aunt  (Not Specified)   Mat Uncle  (Not Specified)   Ala Alice  (Not Specified)   Ala Alice  (Not Specified)   Other  (Not Specified)   Other  (Not Specified)   Other  (Not Specified)   Neg Hx  (Not Specified)  No partnership data on file   Family History  Problem Relation Age of Onset   Cancer Mother        oral   Stroke Mother    Alzheimer's disease Mother        at 20   Heart attack Father 18   Coronary artery disease Brother    Heart attack Brother    Brain cancer Maternal Grandmother    Congestive Heart Failure Maternal Grandfather    Heart disease Maternal Grandfather    Heart attack Paternal Grandfather    Cancer Maternal Aunt        breast   Alcohol abuse Maternal Aunt    Alcohol abuse Maternal Uncle    Cancer Maternal Uncle        lung, stomach, oral   Cancer Paternal Uncle        lung   Cancer Paternal Uncle        bone   Lung cancer  Other        uncle, non smoker   Leukemia Other        uncle   Alzheimer's disease Other        maternal great grandmother    Colon cancer Neg Hx    Breast cancer Neg Hx    Diabetes Neg Hx    Allergies  Allergen Reactions   Ace Inhibitors Anaphylaxis    REACTION: glossal edema   Lisinopril Anaphylaxis   Ciprofloxacin  Other (See Comments)    Told by Rheumatologist not to take it d/t to RA      Review of Systems  Constitutional:  Negative for fever and malaise/fatigue.  HENT:  Negative for congestion.   Eyes:  Negative for blurred vision.  Respiratory:  Negative for shortness of breath.   Cardiovascular:  Negative for chest pain, palpitations and leg swelling.  Gastrointestinal:  Negative for abdominal pain, blood in stool and nausea.  Genitourinary:  Negative for dysuria and frequency.  Musculoskeletal:  Negative for falls.  Skin:  Positive for itching and rash.  Neurological:  Negative for dizziness, loss of consciousness and headaches.  Endo/Heme/Allergies:  Negative for environmental allergies.  Psychiatric/Behavioral:  Negative for depression. The patient is not nervous/anxious.       Objective:     BP 130/80 (BP Location: Left Arm, Patient Position: Sitting, Cuff Size: Large)   Pulse 99   Temp 98.2 F (36.8 C) (Oral)   Resp 20   Ht 5\' 4"  (1.626 m)   Wt 157 lb (71.2 kg)   SpO2 98%   BMI 26.95 kg/m  BP Readings from Last 3 Encounters:  08/14/23 130/80  08/03/23 123/75  07/10/23 118/80   Wt Readings from Last 3 Encounters:  08/14/23 157 lb (71.2 kg)  08/03/23 166 lb (75.3 kg)  07/10/23 166 lb 3.2 oz (75.4 kg)   SpO2 Readings from Last 3 Encounters:  08/14/23 98%  08/03/23 95%  07/10/23 98%      Physical Exam Vitals and nursing note reviewed.  Constitutional:      General: She is not in acute distress.  Appearance: Normal appearance. She is well-developed.  HENT:     Head: Normocephalic and atraumatic.  Eyes:     General: No scleral icterus.        Right eye: No discharge.        Left eye: No discharge.  Cardiovascular:     Rate and Rhythm: Normal rate and regular rhythm.     Heart sounds: No murmur heard. Pulmonary:     Effort: Pulmonary effort is normal. No respiratory distress.     Breath sounds: Normal breath sounds.  Musculoskeletal:        General: Normal range of motion.     Cervical back: Normal range of motion and neck supple.     Right lower leg: No edema.     Left lower leg: No edema.  Skin:    General: Skin is warm and dry.  Neurological:     Mental Status: She is alert and oriented to person, place, and time.  Psychiatric:        Mood and Affect: Mood normal.        Behavior: Behavior normal.        Thought Content: Thought content normal.        Judgment: Judgment normal.      Results for orders placed or performed in visit on 08/14/23  POCT Urinalysis Dipstick (Automated)  Result Value Ref Range   Color, UA yellow    Clarity, UA hazy    Glucose, UA Negative Negative   Bilirubin, UA moderate    Ketones, UA trace    Spec Grav, UA 1.010 1.010 - 1.025   Blood, UA negative    pH, UA 6.0 5.0 - 8.0   Protein, UA Negative Negative   Urobilinogen, UA 0.2 0.2 or 1.0 E.U./dL   Nitrite, UA negative    Leukocytes, UA Moderate (2+) (A) Negative    Last CBC Lab Results  Component Value Date   WBC 14.3 (H) 08/03/2023   HGB 13.7 08/03/2023   HCT 42.4 08/03/2023   MCV 97.0 08/03/2023   MCH 31.4 08/03/2023   RDW 12.8 08/03/2023   PLT 250 08/03/2023   Last metabolic panel Lab Results  Component Value Date   GLUCOSE 93 08/03/2023   NA 140 08/03/2023   K 3.0 (L) 08/03/2023   CL 114 (H) 08/03/2023   CO2 19 (L) 08/03/2023   BUN 12 08/03/2023   CREATININE 0.58 08/03/2023   GFRNONAA >60 08/03/2023   CALCIUM  7.9 (L) 08/03/2023   PHOS 2.8 05/13/2020   PROT 5.4 (L) 08/03/2023   ALBUMIN 3.0 (L) 08/03/2023   LABGLOB 2.5 10/22/2022   AGRATIO 1.7 03/27/2021   BILITOT 0.7 08/03/2023   ALKPHOS 68  08/03/2023   AST 18 08/03/2023   ALT 9 08/03/2023   ANIONGAP 7 08/03/2023   Last lipids Lab Results  Component Value Date   CHOL 199 07/10/2023   HDL 52.20 07/10/2023   LDLCALC 111 (H) 07/10/2023   LDLDIRECT 144 (H) 10/22/2022   TRIG 178.0 (H) 07/10/2023   CHOLHDL 4 07/10/2023   Last hemoglobin A1c Lab Results  Component Value Date   HGBA1C 5.6 11/02/2018   Last thyroid  functions Lab Results  Component Value Date   TSH 0.02 (L) 07/10/2023   T3TOTAL 116 07/28/2018   T4TOTAL 11.0 07/07/2019   Last vitamin D  Lab Results  Component Value Date   VD25OH 49.23 07/10/2023   Last vitamin B12 and Folate Lab Results  Component Value Date   VITAMINB12 610 07/10/2023  FOLATE 12.7 11/10/2019      The 10-year ASCVD risk score (Arnett DK, et al., 2019) is: 28.9%    Assessment & Plan:   Problem List Items Addressed This Visit       Unprioritized   Rash   Relevant Medications   clotrimazole -betamethasone  (LOTRISONE ) cream   Other Visit Diagnoses       Dysuria    -  Primary   Relevant Orders   POCT Urinalysis Dipstick (Automated) (Completed)   Comprehensive metabolic panel with GFR   Urine Culture     Diverticulitis       Relevant Orders   Ambulatory referral to Gastroenterology     Assessment and Plan Assessment & Plan Diverticulitis   A recent episode was treated in the emergency room. Abdominal pain has eased, though some discomfort persists. Constipation is present without diarrhea. Pain is less severe than in previous episodes. Hiatal hernia and GERD may contribute to the discomfort. Follow up with a gastroenterologist for further evaluation and management of diverticulitis and related symptoms.  GERD   Ongoing discomfort is present without severe symptoms. A hiatal hernia may contribute to these symptoms. Follow up with a gastroenterologist for further evaluation and management of GERD and hiatal hernia.  Hiatal Hernia   This condition contributes to  abdominal discomfort, though no acute symptoms are present. Follow up with a gastroenterologist for further evaluation and management of the hiatal hernia.  Urinary Tract Infection   Recurrent infections are noted with a recent burning sensation. Moisture and warmth from pads may contribute to recurrence. Follow up with a urologist for further evaluation and management of recurrent UTIs. Check a urine sample for signs of infection.  Hypokalemia   Recent low potassium levels were noted in the emergency room. She is currently taking potassium supplements and reports no leg cramps. Potassium levels will be checked today. Continue current potassium supplementation and adjust based on lab results.  Neuropathy   Possibly related to past cellulitis, with ongoing skin sensitivity and redness. Previous back surgery and COVID-related complications may have contributed. Follow up with a neurologist for further evaluation and management of neuropathy.    No follow-ups on file.    Kalee Broxton R Lowne Chase, DO

## 2023-08-15 ENCOUNTER — Telehealth: Payer: Self-pay | Admitting: Family Medicine

## 2023-08-15 ENCOUNTER — Other Ambulatory Visit: Payer: Self-pay | Admitting: Family Medicine

## 2023-08-15 ENCOUNTER — Telehealth: Payer: Self-pay

## 2023-08-15 DIAGNOSIS — R300 Dysuria: Secondary | ICD-10-CM | POA: Diagnosis not present

## 2023-08-15 LAB — COMPREHENSIVE METABOLIC PANEL WITH GFR
ALT: 7 U/L (ref 0–35)
AST: 14 U/L (ref 0–37)
Albumin: 4.2 g/dL (ref 3.5–5.2)
Alkaline Phosphatase: 98 U/L (ref 39–117)
BUN: 10 mg/dL (ref 6–23)
CO2: 23 meq/L (ref 19–32)
Calcium: 9.9 mg/dL (ref 8.4–10.5)
Chloride: 107 meq/L (ref 96–112)
Creatinine, Ser: 0.86 mg/dL (ref 0.40–1.20)
GFR: 64.79 mL/min (ref >60.00)
Glucose, Bld: 102 mg/dL — ABNORMAL HIGH (ref 70–99)
Potassium: 3.4 meq/L — ABNORMAL LOW (ref 3.5–5.1)
Sodium: 141 meq/L (ref 135–145)
Total Bilirubin: 0.4 mg/dL (ref 0.2–1.2)
Total Protein: 7.2 g/dL (ref 6.0–8.3)

## 2023-08-15 MED ORDER — NITROFURANTOIN MONOHYD MACRO 100 MG PO CAPS
100.0000 mg | ORAL_CAPSULE | Freq: Two times a day (BID) | ORAL | 0 refills | Status: DC
Start: 2023-08-15 — End: 2023-08-18

## 2023-08-15 NOTE — Telephone Encounter (Signed)
 Copied from CRM [id number]. Topic: Clinical - Lab/Test Results >> Aug 15, 2023  2:41 PM Caliyah H wrote: Reason for CRM: Patient called to inquire about the labs results from 08/14/23. She is requesting a callback to discuss these results.

## 2023-08-15 NOTE — Telephone Encounter (Signed)
 Doesn't look like any medication was sent in

## 2023-08-15 NOTE — Telephone Encounter (Signed)
 CRM already sent to [facility]

## 2023-08-15 NOTE — Telephone Encounter (Signed)
 Pt called asking about an antibiotic for her UTI from appt yesterday. The pharmacy did not have it.  [phone]

## 2023-08-15 NOTE — Telephone Encounter (Signed)
 Results not reviewed by pcp yet

## 2023-08-15 NOTE — Telephone Encounter (Signed)
 Copied from CRM [id number]. Topic: General - Other >> Aug 15, 2023  9:18 AM Dorisann Garre T wrote: Reason for CRM: patient is needing a call back regarding the medication that was suppose to be called in for a uti she would like a call back

## 2023-08-17 LAB — URINE CULTURE
MICRO NUMBER:: 16436079
SPECIMEN QUALITY:: ADEQUATE

## 2023-08-18 ENCOUNTER — Other Ambulatory Visit: Payer: Self-pay

## 2023-08-18 ENCOUNTER — Encounter: Payer: Self-pay | Admitting: Family Medicine

## 2023-08-18 ENCOUNTER — Telehealth: Payer: Self-pay

## 2023-08-18 MED ORDER — CEPHALEXIN 500 MG PO CAPS: 500.0000 mg | ORAL_CAPSULE | Freq: Two times a day (BID) | ORAL | 0 refills | Status: DC

## 2023-08-18 MED ORDER — POTASSIUM CHLORIDE CRYS ER 20 MEQ PO TBCR: 20.0000 meq | EXTENDED_RELEASE_TABLET | Freq: Every day | ORAL | 2 refills | Status: DC

## 2023-08-18 NOTE — Telephone Encounter (Signed)
 Copied from CRM [id number]. Topic: Clinical - Prescription Issue >> Aug 18, 2023  9:09 AM Kayla [name] wrote: Reason for CRM: Patient called in regarding the medication that was prescribed nitrofurantoin, macrocrystal-monohydrate , stating she is having muscle cramps diarrhea and headache and making her heart race is going to stop taking it would like to be sent in something else  Patient also stated she saw in her labs that her potassium  was low, and wanted to know if she needed to be put on a medication or vitamin for it?

## 2023-08-18 NOTE — Telephone Encounter (Signed)
 Pt called. Rxs sent

## 2023-08-19 ENCOUNTER — Ambulatory Visit

## 2023-08-23 ENCOUNTER — Emergency Department (HOSPITAL_COMMUNITY)

## 2023-08-23 ENCOUNTER — Other Ambulatory Visit: Payer: Self-pay

## 2023-08-23 ENCOUNTER — Emergency Department (HOSPITAL_COMMUNITY)
Admission: EM | Admit: 2023-08-23 | Discharge: 2023-08-24 | Disposition: A | Discharge status: Home / Self Care | Attending: Emergency Medicine | Admitting: Emergency Medicine

## 2023-08-23 ENCOUNTER — Encounter (HOSPITAL_COMMUNITY): Payer: Self-pay

## 2023-08-23 DIAGNOSIS — S0083XA Contusion of other part of head, initial encounter: Secondary | ICD-10-CM | POA: Diagnosis not present

## 2023-08-23 DIAGNOSIS — R079 Chest pain, unspecified: Secondary | ICD-10-CM | POA: Diagnosis not present

## 2023-08-23 DIAGNOSIS — S060XAA Concussion with loss of consciousness status unknown, initial encounter: Secondary | ICD-10-CM

## 2023-08-23 DIAGNOSIS — S0990XA Unspecified injury of head, initial encounter: Secondary | ICD-10-CM

## 2023-08-23 DIAGNOSIS — I2510 Atherosclerotic heart disease of native coronary artery without angina pectoris: Secondary | ICD-10-CM | POA: Insufficient documentation

## 2023-08-23 DIAGNOSIS — M25561 Pain in right knee: Secondary | ICD-10-CM | POA: Diagnosis not present

## 2023-08-23 DIAGNOSIS — Y92000 Kitchen of unspecified non-institutional (private) residence as  the place of occurrence of the external cause: Secondary | ICD-10-CM | POA: Insufficient documentation

## 2023-08-23 DIAGNOSIS — S0003XA Contusion of scalp, initial encounter: Secondary | ICD-10-CM | POA: Diagnosis not present

## 2023-08-23 DIAGNOSIS — I10 Essential (primary) hypertension: Secondary | ICD-10-CM | POA: Diagnosis not present

## 2023-08-23 DIAGNOSIS — M25512 Pain in left shoulder: Secondary | ICD-10-CM | POA: Diagnosis not present

## 2023-08-23 DIAGNOSIS — M25552 Pain in left hip: Secondary | ICD-10-CM | POA: Diagnosis not present

## 2023-08-23 DIAGNOSIS — Z79899 Other long term (current) drug therapy: Secondary | ICD-10-CM | POA: Diagnosis not present

## 2023-08-23 DIAGNOSIS — G319 Degenerative disease of nervous system, unspecified: Secondary | ICD-10-CM | POA: Diagnosis not present

## 2023-08-23 DIAGNOSIS — W19XXXA Unspecified fall, initial encounter: Secondary | ICD-10-CM

## 2023-08-23 DIAGNOSIS — S199XXA Unspecified injury of neck, initial encounter: Secondary | ICD-10-CM | POA: Diagnosis not present

## 2023-08-23 DIAGNOSIS — M542 Cervicalgia: Secondary | ICD-10-CM | POA: Diagnosis not present

## 2023-08-23 DIAGNOSIS — S060X0A Concussion without loss of consciousness, initial encounter: Secondary | ICD-10-CM | POA: Diagnosis not present

## 2023-08-23 DIAGNOSIS — W010XXA Fall on same level from slipping, tripping and stumbling without subsequent striking against object, initial encounter: Secondary | ICD-10-CM | POA: Diagnosis not present

## 2023-08-23 DIAGNOSIS — M1711 Unilateral primary osteoarthritis, right knee: Secondary | ICD-10-CM | POA: Diagnosis not present

## 2023-08-23 DIAGNOSIS — M25572 Pain in left ankle and joints of left foot: Secondary | ICD-10-CM | POA: Insufficient documentation

## 2023-08-23 DIAGNOSIS — M19012 Primary osteoarthritis, left shoulder: Secondary | ICD-10-CM | POA: Diagnosis not present

## 2023-08-23 DIAGNOSIS — S4992XA Unspecified injury of left shoulder and upper arm, initial encounter: Secondary | ICD-10-CM | POA: Diagnosis not present

## 2023-08-23 LAB — CBC
HCT: 42.3 % (ref 36.0–46.0)
Hemoglobin: 13.6 g/dL (ref 12.0–15.0)
MCH: 31.7 pg (ref 26.0–34.0)
MCHC: 32.2 g/dL (ref 30.0–36.0)
MCV: 98.6 fL (ref 80.0–100.0)
Platelets: 282 10*3/uL (ref 150–400)
RBC: 4.29 MIL/uL (ref 3.87–5.11)
RDW: 13 % (ref 11.5–15.5)
WBC: 8.5 10*3/uL (ref 4.0–10.5)
nRBC: 0 % (ref 0.0–0.2)

## 2023-08-23 LAB — BASIC METABOLIC PANEL WITH GFR
Anion gap: 10 (ref 5–15)
BUN: 13 mg/dL (ref 8–23)
CO2: 22 mmol/L (ref 22–32)
Calcium: 9.3 mg/dL (ref 8.9–10.3)
Chloride: 105 mmol/L (ref 98–111)
Creatinine, Ser: 0.66 mg/dL (ref 0.44–1.00)
GFR, Estimated: >60 mL/min (ref >60)
Glucose, Bld: 96 mg/dL (ref 70–99)
Potassium: 3.5 mmol/L (ref 3.5–5.1)
Sodium: 137 mmol/L (ref 135–145)

## 2023-08-23 NOTE — ED Notes (Signed)
Pt ambulatory to restroom with a walker

## 2023-08-23 NOTE — ED Provider Notes (Signed)
 [facility] AT [facility] Provider Note   CSN: [id number] Arrival date & time: 08/23/23  2052     History  Chief Complaint  Patient presents with   Kayla [name] is a 78 y.o. female with past medical history seen for hypertension, hyperlipidemia, GERD, coronary artery disease, fibromyalgia, anxiety, arthritis who presents with concern for fall after tripping mechanically in her kitchen.  She endorses injury to forehead, pain in left shoulder, right knee, left ankle and left hip.  Currently taking antibiotics for UTI but does not endorse feeling lightheaded, dizzy prior to falling.  Denies any nausea, vomiting, loss of consciousness.   Fall       Home Medications Prior to Admission medications   Medication Sig Start Date End Date Taking? Authorizing Provider  acetaminophen  (TYLENOL ) 500 MG tablet Take 1,000 mg by mouth in the morning, at noon, in the evening, and at bedtime.    [provider]  amLODipine  (NORVASC ) 5 MG tablet Take 1 tablet (5 mg total) by mouth daily. 11/15/22   Sheryle Donning, MD  Azelastine -Fluticasone  137-50 MCG/ACT SUSP Place 1 spray into the nose every 12 (twelve) hours. 07/10/23   Lowne Chase, Yvonne R, DO  bismuth subsalicylate (PEPTO BISMOL) 262 MG/15ML suspension Take 30 mLs by mouth every 6 (six) hours as needed for indigestion or diarrhea or loose stools.    [provider]  cephALEXin  (KEFLEX ) 500 MG capsule Take 1 capsule (500 mg total) by mouth 2 (two) times daily. 08/18/23   Lowne Chase, Yvonne R, DO  Cholecalciferol (VITAMIN D3) 1.25 MG (50000 UT) CAPS Take 1 capsule by mouth once a week. Sunday 07/22/22   [provider]  clotrimazole -betamethasone  (LOTRISONE ) cream Apply 1 Application topically daily. 08/14/23   Roel Clarity R, DO  [employer] VITAMIN B12 1000 MCG tablet Take 2,000 mcg by mouth daily.    [provider]  hydrALAZINE  (APRESOLINE ) 10 MG tablet Take 1 tablet (10  mg total) by mouth 3 (three) times daily as needed (for elevated blood pressure (top number more than 140, bottom number more than 90)). 02/14/23   Sheryle Donning, MD  hydrOXYzine  (ATARAX ) 25 MG tablet Take 1 tablet (25 mg total) by mouth every 8 (eight) hours as needed for itching. 04/27/23   Levora Reas A, NP  levothyroxine  (SYNTHROID ) 175 MCG tablet Take 175 mcg by mouth daily before breakfast.    [provider]  nystatin  cream (MYCOSTATIN ) Apply to affected area 2 times daily 04/27/23   Levora Reas A, NP  ondansetron  (ZOFRAN -ODT) 4 MG disintegrating tablet Take 1 tablet (4 mg total) by mouth every 8 (eight) hours as needed. 08/03/23   Kelsey Patricia, MD  pantoprazole  (PROTONIX ) 40 MG tablet Take 1 tablet (40 mg total) by mouth daily. 07/10/23   Estill Hemming, DO  potassium chloride  SA (KLOR-CON  M) 20 MEQ tablet Take 1 tablet (20 mEq total) by mouth daily. 08/18/23   Estill Hemming, DO      Allergies    Ace inhibitors, Lisinopril, and Ciprofloxacin     Review of Systems   Review of Systems  All other systems reviewed and are negative.   Physical Exam Updated Vital Signs BP (!) 141/87   Pulse 72   Temp 97.7 F (36.5 C) (Oral)   Resp 17   Ht 5\' 4"  (1.626 m)   Wt 68 kg   SpO2 100%   BMI 25.75 kg/m  Physical Exam Vitals and  nursing note reviewed.  Constitutional:      General: She is not in acute distress.    Appearance: Normal appearance.  HENT:     Head: Normocephalic and atraumatic.  Eyes:     General:        Right eye: No discharge.        Left eye: No discharge.  Cardiovascular:     Rate and Rhythm: Normal rate and regular rhythm.     Heart sounds: No murmur heard.    No friction rub. No gallop.  Pulmonary:     Effort: Pulmonary effort is normal.     Breath sounds: Normal breath sounds.  Abdominal:     General: Bowel sounds are normal.     Palpations: Abdomen is soft.  Musculoskeletal:     Comments: No obvious step-off or  deformity of bilateral shoulders, hips, elbows, wrists, knees, ankles, but some tenderness to palpation on the left hip, right knee, and left ankle.  Mild tenderness on lateral left shoulder as well.  Normal range of motion throughout all extremities.  She does have a hematoma to left forehead.  Mild tenderness of the left cervical paraspinous muscles, no significant midline spinal tenderness.  Skin:    General: Skin is warm and dry.     Capillary Refill: Capillary refill takes less than 2 seconds.  Neurological:     Mental Status: She is alert and oriented to person, place, and time.     Comments: Moves all 4 limbs spontaneously, CN II through XII grossly intact, can ambulate without difficulty, intact sensation throughout.  Psychiatric:        Mood and Affect: Mood normal.        Behavior: Behavior normal.     ED Results / Procedures / Treatments   Labs (all labs ordered are listed, but only abnormal results are displayed) Labs Reviewed  CBC  BASIC METABOLIC PANEL WITH GFR  TROPONIN I (HIGH SENSITIVITY)    EKG EKG Interpretation Date/Time:  Saturday Aug 23 2023 21:44:18 EDT Ventricular Rate:  75 PR Interval:  170 QRS Duration:  103 QT Interval:  392 QTC Calculation: 438 R Axis:   30  Text Interpretation: Sinus rhythm RSR' in V1 or V2, right VCD or RVH no significant change since Dec 2024 Confirmed by Jerilynn Montenegro [phone]) on 08/23/2023 11:02:35 PM  Radiology DG HIP UNILAT WITH PELVIS 2-3 VIEWS LEFT Result Date: 08/23/2023 CLINICAL DATA:  Recent trip and fall with left hip pain, initial encounter EXAM: DG HIP (WITH OR WITHOUT PELVIS) 3V LEFT COMPARISON:  None Available. FINDINGS: There is no evidence of hip fracture or dislocation. There is no evidence of arthropathy or other focal bone abnormality. IMPRESSION: No acute abnormality noted. Electronically Signed   By: Violeta Grey M.D.   On: 08/23/2023 22:20   DG Shoulder Left Result Date: 08/23/2023 CLINICAL DATA:  Fall,  injury EXAM: LEFT SHOULDER - 2+ VIEW COMPARISON:  None Available. FINDINGS: There is no acute fracture or dislocation. There is mild glenohumeral and acromioclavicular joint space narrowing. Soft tissues are within normal limits. Cervical spinal fusion plate is present. IMPRESSION: 1. No acute fracture or dislocation. 2. Mild degenerative changes. Electronically Signed   By: Tyron Gallon M.D.   On: 08/23/2023 22:19   DG Knee Complete 4 Views Right Result Date: 08/23/2023 CLINICAL DATA:  Recent fall with right knee pain, initial encounter EXAM: RIGHT KNEE - COMPLETE 4+ VIEW COMPARISON:  None Available. FINDINGS: Tricompartmental degenerative changes are noted. No acute  fracture or dislocation is seen. No joint effusion is noted. IMPRESSION: Degenerative change without acute abnormality. Electronically Signed   By: Violeta Grey M.D.   On: 08/23/2023 22:19   DG Ankle Complete Left Result Date: 08/23/2023 CLINICAL DATA:  Recent trip and fall with left ankle pain, initial encounter EXAM: LEFT ANKLE COMPLETE - 3+ VIEW COMPARISON:  None Available. FINDINGS: Generalized soft tissue swelling is noted about the ankle. No acute fracture or dislocation is IMPRESSION: Soft tissue swelling without acute bony abnormality. Electronically Signed   By: Violeta Grey M.D.   On: 08/23/2023 22:18   CT Head Wo Contrast Result Date: 08/23/2023 CLINICAL DATA:  Head trauma, face trauma, head and neck trauma. Fall injury. EXAM: CT HEAD WITHOUT CONTRAST CT MAXILLOFACIAL WITHOUT CONTRAST CT CERVICAL SPINE WITHOUT CONTRAST TECHNIQUE: Multidetector CT imaging of the head, cervical spine, and maxillofacial structures were performed using the standard protocol without intravenous contrast. Multiplanar CT image reconstructions of the cervical spine and maxillofacial structures were also generated. RADIATION DOSE REDUCTION: This exam was performed according to the departmental dose-optimization program which includes automated exposure  control, adjustment of the mA and/or kV according to patient size and/or use of iterative reconstruction technique. COMPARISON:  Head, face and cervical spine CTs from 02/25/2023. FINDINGS: CT HEAD FINDINGS Brain: There is mild global atrophy, mild small-vessel disease of the cerebral white matter. The ventricles are normal in size and position. No cortical based acute infarct, hemorrhage, mass or mass effect are seen. Basal cisterns are clear. Partially empty sella is again noted. Vascular: Scattered calcific plaque both siphons. No hyperdense central vasculature. Skull: Negative for fractures or focal lesions. Small left frontal scalp hematoma. Other: None. CT MAXILLOFACIAL FINDINGS Osseous: No fracture or mandibular dislocation. No destructive process. There is a chronic healed fracture deformity of the anterior left zygomatic arch. There is multifocal tooth decay and periapical bone lucencies. Follow-up with a dentist is recommended. Orbits: Negative. No traumatic or inflammatory finding. Sinuses: Chronic patchy fluid in both mastoid tips. Otherwise clear mastoids and middle ears. Clear sinuses and ostiomeatal complexes. Clear nasal passages with slight S shaped nasal septum. Soft tissues: No facial hematoma or mass. CT CERVICAL SPINE FINDINGS Alignment: Chronic bone-on-bone anterior atlantodental joint space loss osteophytosis. Otherwise normal alignment. Unchanged. Skull base and vertebrae: There is osteopenia. No acute fracture is evident. No primary bone lesion or focal pathologic process. There is non-plated interbody and facet ankylosis of C3-4, anterior plated mature fusion C6-7. Other disc spaces are patent.  Other facet joints are patent. Soft tissues and spinal canal: No prevertebral fluid or swelling. No visible canal hematoma. There are calcific plaques in both proximal cervical ICAs. Thyroid  gland is very small consistent with severe atrophy versus prior ablation. Disc levels: Mild disc narrowing  again C2-3, C5-6 and C7-T1, with normal C4-5 disc height. There is slight posterior endplate spurring at C4-5 and C5-6, but no significant thecal sac encroachment. There are no herniated discs or cord compromise. Facet joint and uncinate spurring causes moderate to severe right foraminal stenosis at C4-5, mild left foraminal stenosis C5-6. There is right-greater-than-left facet arthrosis at most levels. Other foramina are widely patent. No evidence of C6-7 hardware loosening. Upper chest: Negative. Other: None. IMPRESSION: 1. No acute intracranial CT findings or depressed skull fractures. Left forehead scalp hematoma. 2. Stable atrophy and small-vessel disease. 3. No acute facial CT findings. 4. Osteopenia, postsurgical and degenerative change without evidence of cervical fractures. 5. Multifocal tooth decay and periapical bone lucencies. Follow-up with a  dentist is recommended. 6. Chronic patchy fluid in both mastoid tips. 7. Carotid ICA atherosclerosis. Electronically Signed   By: Denman Fischer M.D.   On: 08/23/2023 21:59   CT Maxillofacial Wo Contrast Result Date: 08/23/2023 CLINICAL DATA:  Head trauma, face trauma, head and neck trauma. Fall injury. EXAM: CT HEAD WITHOUT CONTRAST CT MAXILLOFACIAL WITHOUT CONTRAST CT CERVICAL SPINE WITHOUT CONTRAST TECHNIQUE: Multidetector CT imaging of the head, cervical spine, and maxillofacial structures were performed using the standard protocol without intravenous contrast. Multiplanar CT image reconstructions of the cervical spine and maxillofacial structures were also generated. RADIATION DOSE REDUCTION: This exam was performed according to the departmental dose-optimization program which includes automated exposure control, adjustment of the mA and/or kV according to patient size and/or use of iterative reconstruction technique. COMPARISON:  Head, face and cervical spine CTs from 02/25/2023. FINDINGS: CT HEAD FINDINGS Brain: There is mild global atrophy, mild  small-vessel disease of the cerebral white matter. The ventricles are normal in size and position. No cortical based acute infarct, hemorrhage, mass or mass effect are seen. Basal cisterns are clear. Partially empty sella is again noted. Vascular: Scattered calcific plaque both siphons. No hyperdense central vasculature. Skull: Negative for fractures or focal lesions. Small left frontal scalp hematoma. Other: None. CT MAXILLOFACIAL FINDINGS Osseous: No fracture or mandibular dislocation. No destructive process. There is a chronic healed fracture deformity of the anterior left zygomatic arch. There is multifocal tooth decay and periapical bone lucencies. Follow-up with a dentist is recommended. Orbits: Negative. No traumatic or inflammatory finding. Sinuses: Chronic patchy fluid in both mastoid tips. Otherwise clear mastoids and middle ears. Clear sinuses and ostiomeatal complexes. Clear nasal passages with slight S shaped nasal septum. Soft tissues: No facial hematoma or mass. CT CERVICAL SPINE FINDINGS Alignment: Chronic bone-on-bone anterior atlantodental joint space loss osteophytosis. Otherwise normal alignment. Unchanged. Skull base and vertebrae: There is osteopenia. No acute fracture is evident. No primary bone lesion or focal pathologic process. There is non-plated interbody and facet ankylosis of C3-4, anterior plated mature fusion C6-7. Other disc spaces are patent.  Other facet joints are patent. Soft tissues and spinal canal: No prevertebral fluid or swelling. No visible canal hematoma. There are calcific plaques in both proximal cervical ICAs. Thyroid  gland is very small consistent with severe atrophy versus prior ablation. Disc levels: Mild disc narrowing again C2-3, C5-6 and C7-T1, with normal C4-5 disc height. There is slight posterior endplate spurring at C4-5 and C5-6, but no significant thecal sac encroachment. There are no herniated discs or cord compromise. Facet joint and uncinate spurring  causes moderate to severe right foraminal stenosis at C4-5, mild left foraminal stenosis C5-6. There is right-greater-than-left facet arthrosis at most levels. Other foramina are widely patent. No evidence of C6-7 hardware loosening. Upper chest: Negative. Other: None. IMPRESSION: 1. No acute intracranial CT findings or depressed skull fractures. Left forehead scalp hematoma. 2. Stable atrophy and small-vessel disease. 3. No acute facial CT findings. 4. Osteopenia, postsurgical and degenerative change without evidence of cervical fractures. 5. Multifocal tooth decay and periapical bone lucencies. Follow-up with a dentist is recommended. 6. Chronic patchy fluid in both mastoid tips. 7. Carotid ICA atherosclerosis. Electronically Signed   By: Denman Fischer M.D.   On: 08/23/2023 21:59   CT Cervical Spine Wo Contrast Result Date: 08/23/2023 CLINICAL DATA:  Head trauma, face trauma, head and neck trauma. Fall injury. EXAM: CT HEAD WITHOUT CONTRAST CT MAXILLOFACIAL WITHOUT CONTRAST CT CERVICAL SPINE WITHOUT CONTRAST TECHNIQUE: Multidetector CT imaging of the  head, cervical spine, and maxillofacial structures were performed using the standard protocol without intravenous contrast. Multiplanar CT image reconstructions of the cervical spine and maxillofacial structures were also generated. RADIATION DOSE REDUCTION: This exam was performed according to the departmental dose-optimization program which includes automated exposure control, adjustment of the mA and/or kV according to patient size and/or use of iterative reconstruction technique. COMPARISON:  Head, face and cervical spine CTs from 02/25/2023. FINDINGS: CT HEAD FINDINGS Brain: There is mild global atrophy, mild small-vessel disease of the cerebral white matter. The ventricles are normal in size and position. No cortical based acute infarct, hemorrhage, mass or mass effect are seen. Basal cisterns are clear. Partially empty sella is again noted. Vascular:  Scattered calcific plaque both siphons. No hyperdense central vasculature. Skull: Negative for fractures or focal lesions. Small left frontal scalp hematoma. Other: None. CT MAXILLOFACIAL FINDINGS Osseous: No fracture or mandibular dislocation. No destructive process. There is a chronic healed fracture deformity of the anterior left zygomatic arch. There is multifocal tooth decay and periapical bone lucencies. Follow-up with a dentist is recommended. Orbits: Negative. No traumatic or inflammatory finding. Sinuses: Chronic patchy fluid in both mastoid tips. Otherwise clear mastoids and middle ears. Clear sinuses and ostiomeatal complexes. Clear nasal passages with slight S shaped nasal septum. Soft tissues: No facial hematoma or mass. CT CERVICAL SPINE FINDINGS Alignment: Chronic bone-on-bone anterior atlantodental joint space loss osteophytosis. Otherwise normal alignment. Unchanged. Skull base and vertebrae: There is osteopenia. No acute fracture is evident. No primary bone lesion or focal pathologic process. There is non-plated interbody and facet ankylosis of C3-4, anterior plated mature fusion C6-7. Other disc spaces are patent.  Other facet joints are patent. Soft tissues and spinal canal: No prevertebral fluid or swelling. No visible canal hematoma. There are calcific plaques in both proximal cervical ICAs. Thyroid  gland is very small consistent with severe atrophy versus prior ablation. Disc levels: Mild disc narrowing again C2-3, C5-6 and C7-T1, with normal C4-5 disc height. There is slight posterior endplate spurring at C4-5 and C5-6, but no significant thecal sac encroachment. There are no herniated discs or cord compromise. Facet joint and uncinate spurring causes moderate to severe right foraminal stenosis at C4-5, mild left foraminal stenosis C5-6. There is right-greater-than-left facet arthrosis at most levels. Other foramina are widely patent. No evidence of C6-7 hardware loosening. Upper chest:  Negative. Other: None. IMPRESSION: 1. No acute intracranial CT findings or depressed skull fractures. Left forehead scalp hematoma. 2. Stable atrophy and small-vessel disease. 3. No acute facial CT findings. 4. Osteopenia, postsurgical and degenerative change without evidence of cervical fractures. 5. Multifocal tooth decay and periapical bone lucencies. Follow-up with a dentist is recommended. 6. Chronic patchy fluid in both mastoid tips. 7. Carotid ICA atherosclerosis. Electronically Signed   By: Denman Fischer M.D.   On: 08/23/2023 21:59    Procedures Procedures    Medications Ordered in ED Medications - No data to display  ED Course/ Medical Decision Making/ A&P                                 Medical Decision Making Amount and/or Complexity of Data Reviewed Labs: ordered. Radiology: ordered.   This patient is a 78 y.o. female  who presents to the ED for concern of fall, head injury, various other injuries.   Differential diagnoses prior to evaluation: The emergent differential diagnosis includes, but is not limited to,  epidural hematoma,  subdural hematoma, skull fracture, subarachnoid hemorrhage, unstable cervical spine fracture, concussion vs other MSK injury . This is not an exhaustive differential.   Past Medical History / Co-morbidities / Social History: hypertension, hyperlipidemia, GERD, coronary artery disease, fibromyalgia, anxiety, arthritis  Additional history: Chart reviewed. Pertinent results include: Reviewed lab work, imaging from previous emergency department visits, recent echo in 2022 as well as remote cath without occlusive disease from 2017  Physical Exam: Physical exam performed. The pertinent findings include: no obvious step-off or deformity of bilateral shoulders, hips, elbows, wrists, knees, ankles, but some tenderness to palpation on the left hip, right knee, and left ankle.  Mild tenderness on lateral left shoulder as well.  Normal range of motion  throughout all extremities.  She does have a hematoma to left forehead.  Mild tenderness of the left cervical paraspinous muscles, no significant midline spinal tenderness.   Lab Tests/Imaging studies: I personally interpreted labs/imaging and the pertinent results include: Initially patient denied any kind of chest pain, shortness of breath, or other symptoms prior to her reportedly mechanical fall, on reevaluation she was endorsing several episodes of sharp chest pain since she has been in the emergency department, will obtain CBC, BMP, troponin, plain film chest x-ray, I did independently interpreted plain film radiograph of the left hip, left shoulder, right knee, left ankle, as well as CT head, maxillofacial and C-spine, she has several chronic noted changes including sinus disease, osteopenia and some periapical lucencies, but no acute fractures, intracranial injury, she does have noted left scalp hematoma which was noted on exam.  No open laceration requiring repair.. I agree with the radiologist interpretation.  Cardiac monitoring: EKG obtained and interpreted by myself and attending physician which shows: Normal sinus rhythm, no significant change from recent baseline, no acute ST-T changes.   Medications: Patient declined any pain medication, has not had any ongoing chest pain or persistent chest pain.  11:33 PM Care of Kayla [name] transferred to [employer] and Dr. Morris Arch at the end of my shift as the patient will require reassessment once labs/imaging have resulted. Patient presentation, ED course, and plan of care discussed with review of all pertinent labs and imaging. Please see his/her note for further details regarding further ED course and disposition. Plan at time of handoff is follow-up on cardiac labs and reassess patient, likely will be stable for discharge unless new cardiac pathology noted. This may be altered or completely changed at the discretion of the oncoming team  pending results of further workup.  Final Clinical Impression(s) / ED Diagnoses Final diagnoses:  None    Rx / DC Orders ED Discharge Orders     None         Stefan Edge 08/23/23 2333    Jerilynn Montenegro, MD 08/24/23 [phone]

## 2023-08-23 NOTE — ED Triage Notes (Addendum)
 Pt presents with injuries from a fall after she tripped and fell in her kitchen. She has injury to her forehead, L shoulder, R knee, and L ankle. She denies LOC and denies taking anticoagulants. Pt is currently on abx for a UTI.

## 2023-08-24 LAB — TROPONIN I (HIGH SENSITIVITY)
Troponin I (High Sensitivity): 5 ng/L (ref <18)
Troponin I (High Sensitivity): 5 ng/L (ref <18)

## 2023-08-24 MED ORDER — ACETAMINOPHEN 500 MG PO TABS
1000.0000 mg | ORAL_TABLET | Freq: Once | ORAL | Status: AC
  Administered 2023-08-24: 1000 mg via ORAL
  Filled 2023-08-24: qty 2

## 2023-08-24 NOTE — ED Provider Notes (Signed)
 Patient signed out to me pending labs.  Labs look good.  Trops are flat.  Doubt ACS.  Patient has follow-up with neurology for headaches.  She likely also has concussion from her fall.  She is advised to discuss this with the neurologist as well.   Sherel Dikes, PA-C 08/24/23 0256    Lindle Rhea, MD 08/24/23 [phone]

## 2023-09-02 ENCOUNTER — Encounter (HOSPITAL_COMMUNITY): Payer: Self-pay

## 2023-09-02 ENCOUNTER — Emergency Department (HOSPITAL_COMMUNITY)
Admission: EM | Admit: 2023-09-02 | Discharge: 2023-09-02 | Disposition: A | Discharge status: Home / Self Care | Attending: Emergency Medicine | Admitting: Emergency Medicine

## 2023-09-02 ENCOUNTER — Other Ambulatory Visit: Payer: Self-pay

## 2023-09-02 DIAGNOSIS — R300 Dysuria: Secondary | ICD-10-CM | POA: Diagnosis present

## 2023-09-02 DIAGNOSIS — N390 Urinary tract infection, site not specified: Secondary | ICD-10-CM | POA: Diagnosis not present

## 2023-09-02 LAB — URINALYSIS, ROUTINE W REFLEX MICROSCOPIC
Bacteria, UA: NONE SEEN
Bilirubin Urine: NEGATIVE
Glucose, UA: NEGATIVE mg/dL
Ketones, ur: NEGATIVE mg/dL
Nitrite: NEGATIVE
Protein, ur: 100 mg/dL — AB
Specific Gravity, Urine: 1.036 — ABNORMAL HIGH (ref 1.005–1.030)
WBC, UA: >50 WBC/hpf (ref 0–5)
pH: 5 (ref 5.0–8.0)

## 2023-09-02 MED ORDER — CEPHALEXIN 500 MG PO CAPS: 1000.0000 mg | ORAL_CAPSULE | Freq: Two times a day (BID) | ORAL | 0 refills | Status: AC

## 2023-09-02 MED ORDER — CEPHALEXIN 500 MG PO CAPS
1000.0000 mg | ORAL_CAPSULE | Freq: Once | ORAL | Status: AC
  Administered 2023-09-02: 1000 mg via ORAL
  Filled 2023-09-02: qty 2

## 2023-09-02 NOTE — Discharge Instructions (Signed)
 I am going to treat you for a urinary tract infection.   Please return for fever, vomiting, worsening pain in your side.   Follow up with your urologist in the office.

## 2023-09-02 NOTE — ED Provider Notes (Signed)
 [place] [facility] AT [facility] Provider Note   CSN: [id number] Arrival date & time: 09/02/23  4098     History  Chief Complaint  Patient presents with   Dysuria    Kayla [name] is a 78 y.o. female.  65 yoF with a chief complaint of pain with urination.  This started last night and has persisted into the morning.  She felt like it got a little bit more severe and she had made an appointment with her urology office but it was not until tomorrow and came to the emergency department to be evaluated.  She tells me she has gotten frequent urinary tract infection since she was 10.  Has had some episodes where she had to be admitted to the hospital.  She had a urinary tract infection recently and had been treated.  Also recently suffered a fall and had to be seen in the emergency department setting.  She denies fevers or flank pain.  This feels just like when she has had a urinary tract infection in the past.   Dysuria      Home Medications Prior to Admission medications   Medication Sig Start Date End Date Taking? Authorizing Provider  cephALEXin  (KEFLEX ) 500 MG capsule Take 2 capsules (1,000 mg total) by mouth 2 (two) times daily for 7 days. 09/02/23 09/09/23 Yes Albertus Hughs, DO  acetaminophen  (TYLENOL ) 500 MG tablet Take 1,000 mg by mouth in the morning, at noon, in the evening, and at bedtime.    [provider]  amLODipine  (NORVASC ) 5 MG tablet Take 1 tablet (5 mg total) by mouth daily. 11/15/22   Sheryle Donning, MD  Azelastine -Fluticasone  137-50 MCG/ACT SUSP Place 1 spray into the nose every 12 (twelve) hours. 07/10/23   Lowne Chase, Yvonne R, DO  bismuth subsalicylate (PEPTO BISMOL) 262 MG/15ML suspension Take 30 mLs by mouth every 6 (six) hours as needed for indigestion or diarrhea or loose stools.    [provider]  Cholecalciferol (VITAMIN D3) 1.25 MG (50000 UT) CAPS Take 1 capsule by mouth once a week. Sunday 07/22/22   [provider]  clotrimazole -betamethasone  (LOTRISONE ) cream Apply 1 Application topically daily. 08/14/23   Roel Clarity R, DO  [employer] VITAMIN B12 1000 MCG tablet Take 2,000 mcg by mouth daily.    [provider]  hydrALAZINE  (APRESOLINE ) 10 MG tablet Take 1 tablet (10 mg total) by mouth 3 (three) times daily as needed (for elevated blood pressure (top number more than 140, bottom number more than 90)). 02/14/23   Sheryle Donning, MD  hydrOXYzine  (ATARAX ) 25 MG tablet Take 1 tablet (25 mg total) by mouth every 8 (eight) hours as needed for itching. 04/27/23   Levora Reas A, NP  levothyroxine  (SYNTHROID ) 175 MCG tablet Take 175 mcg by mouth daily before breakfast.    [provider]  nystatin  cream (MYCOSTATIN ) Apply to affected area 2 times daily 04/27/23   Levora Reas A, NP  ondansetron  (ZOFRAN -ODT) 4 MG disintegrating tablet Take 1 tablet (4 mg total) by mouth every 8 (eight) hours as needed. 08/03/23   Kelsey Patricia, MD  pantoprazole  (PROTONIX ) 40 MG tablet Take 1 tablet (40 mg total) by mouth daily. 07/10/23   Lowne Chase, Yvonne R, DO  potassium chloride  SA (KLOR-CON  M) 20 MEQ tablet Take 1 tablet (20 mEq total) by mouth daily. 08/18/23   Estill Hemming, DO      Allergies    Ace inhibitors, Lisinopril, and Ciprofloxacin   Review of Systems   Review of Systems  Genitourinary:  Positive for dysuria.    Physical Exam Updated Vital Signs BP 137/82 (BP Location: Left Arm)   Pulse 70   Temp 97.7 F (36.5 C) (Oral)   Resp 18   SpO2 99%  Physical Exam Vitals and nursing note reviewed.  Constitutional:      General: She is not in acute distress.    Appearance: She is well-developed. She is not diaphoretic.  HENT:     Head: Normocephalic and atraumatic.  Eyes:     Pupils: Pupils are equal, round, and reactive to light.  Cardiovascular:     Rate and Rhythm: Normal rate and regular rhythm.     Heart sounds: No murmur heard.    No friction rub.  No gallop.  Pulmonary:     Effort: Pulmonary effort is normal.     Breath sounds: No wheezing or rales.  Abdominal:     General: There is no distension.     Palpations: Abdomen is soft.     Tenderness: There is no abdominal tenderness. There is no right CVA tenderness or left CVA tenderness.  Musculoskeletal:        General: No tenderness.     Cervical back: Normal range of motion and neck supple.  Skin:    General: Skin is warm and dry.  Neurological:     Mental Status: She is alert and oriented to person, place, and time.  Psychiatric:        Behavior: Behavior normal.     ED Results / Procedures / Treatments   Labs (all labs ordered are listed, but only abnormal results are displayed) Labs Reviewed  URINALYSIS, ROUTINE W REFLEX MICROSCOPIC - Abnormal; Notable for the following components:      Result Value   Color, Urine AMBER (*)    APPearance CLOUDY (*)    Specific Gravity, Urine 1.036 (*)    Hgb urine dipstick SMALL (*)    Protein, ur 100 (*)    Leukocytes,Ua LARGE (*)    All other components within normal limits    EKG None  Radiology No results found.  Procedures Procedures    Medications Ordered in ED Medications  cephALEXin  (KEFLEX ) capsule 1,000 mg (has no administration in time range)    ED Course/ Medical Decision Making/ A&P                                 Medical Decision Making Amount and/or Complexity of Data Reviewed Labs: ordered.  Risk Prescription drug management.   78 yo F with frequent urinary tract infections comes in with a chief complaints of dysuria increased frequency and hesitancy.  Going on since last night.  Patient's UA without obvious infection however with significant history of multiple episodes in the past to numerous count white cells.  She recently had a mildly resistance E. coli on my record review.  Will start on oral antibiotics.  She has follow-up with your urologist tomorrow.  11:02 AM:  I have discussed the  diagnosis/risks/treatment options with the patient.  Evaluation and diagnostic testing in the emergency department does not suggest an emergent condition requiring admission or immediate intervention beyond what has been performed at this time.  They will follow up with Urology. We also discussed returning to the ED immediately if new or worsening sx occur. We discussed the sx which are most concerning (e.g., sudden worsening  pain, fever, inability to tolerate by mouth) that necessitate immediate return. Medications administered to the patient during their visit and any new prescriptions provided to the patient are listed below.  Medications given during this visit Medications  cephALEXin  (KEFLEX ) capsule 1,000 mg (has no administration in time range)     The patient appears reasonably screen and/or stabilized for discharge and I doubt any other medical condition or other [facility] requiring further screening, evaluation, or treatment in the ED at this time prior to discharge.          Final Clinical Impression(s) / ED Diagnoses Final diagnoses:  Lower urinary tract infectious disease    Rx / DC Orders ED Discharge Orders          Ordered    cephALEXin  (KEFLEX ) 500 MG capsule  2 times daily        09/02/23 1100              [place], DO 09/02/23 1102

## 2023-09-02 NOTE — ED Triage Notes (Signed)
 Pt states she is having painful urination and pain in her lower abdomen. Has appointment with urology tomorrow but couldn't wait.

## 2023-09-02 NOTE — Progress Notes (Signed)
 Chief Complaint: No chief complaint on file.   History of Present Illness:  Kayla [name] is a 78 y.o. female who is seen in consultation from Crecencio Dodge, Candida Chalk, DO for evaluation of recurrent urinary tract infections.  Since February, 2024 she has had 5 positive cultures for E. coli.  Most recent culture showed ESBL E. coli.  She most recently presented to the emergency room yesterday with pelvic pressure, pain, difficulty urinating.  Urine appeared infected, she was started on cephalexin .  She denies any history of febrile UTIs.  Rare blood noted in her urine when she does have infections.  Typically, infections are fairly easily cleared with oral antibiotics.      Past Medical History:  Past Medical History:  Diagnosis Date   Anemia    Anxiety    Asthma    B12 deficiency    Bursitis    Chronic LBP    Chronic UTI    Diastolic dysfunction    Fibromyalgia    GERD (gastroesophageal reflux disease)    Hearing impaired person, bilateral    per pt   History of low potassium    Hyperlipemia    Hypertension    Hypothyroid    IBS (irritable bowel syndrome)    Inflammatory osteoarthritis    Kidney infection    Psoriatic arthritis (HCC)    S/P cardiac cath 11/30/2007   normal coronaries - Dr. Arlester Ladd (Dr. Maximo Spar reviewed films on 01/10/2016)    Past Surgical History:  Past Surgical History:  Procedure Laterality Date   CESAREAN SECTION  06/10/1975   CHOLECYSTECTOMY     LUMBAR LAMINECTOMY/DECOMPRESSION MICRODISCECTOMY Right 05/25/2020   Procedure: MICRODISCECTOMY Right Lumbar three - four;  Surgeon: Garry Kansas, MD;  Location: [facility] OR;  Service: Neurosurgery;  Laterality: Right;   NECK SURGERY  1998   RADIOLOGY WITH ANESTHESIA N/A 05/16/2020   Procedure: MRI LUMBAR SPINE W/CONTRAST;  Surgeon: Radiologist, Medication, MD;  Location: [facility] OR;  Service: Radiology;  Laterality: N/A;   repair broken C6 & C7      Allergies:  Allergies  Allergen Reactions   Ace Inhibitors  Anaphylaxis    REACTION: glossal edema   Lisinopril Anaphylaxis   Ciprofloxacin  Other (See Comments)    Told by Rheumatologist not to take it d/t to RA    Family History:  Family History  Problem Relation Age of Onset   Cancer Mother        oral   Stroke Mother    Alzheimer's disease Mother        at 45   Heart attack Father 42   Coronary artery disease Brother    Heart attack Brother    Brain cancer Maternal Grandmother    Congestive Heart Failure Maternal Grandfather    Heart disease Maternal Grandfather    Heart attack Paternal Grandfather    Cancer Maternal Aunt        breast   Alcohol abuse Maternal Aunt    Alcohol abuse Maternal Uncle    Cancer Maternal Uncle        lung, stomach, oral   Cancer Paternal Uncle        lung   Cancer Paternal Uncle        bone   Lung cancer Other        uncle, non smoker   Leukemia Other        uncle   Alzheimer's disease Other        maternal great grandmother  Colon cancer Neg Hx    Breast cancer Neg Hx    Diabetes Neg Hx     Social History:  Social History   Tobacco Use   Smoking status: Never    Passive exposure: Never   Smokeless tobacco: Never  Vaping Use   Vaping status: Never Used  Substance Use Topics   Alcohol use: Never   Drug use: No    Review of symptoms:  Constitutional:  Negative for unexplained weight loss, night sweats, fever, chills ENT:  Negative for nose bleeds, sinus pain, painful swallowing CV:  Negative for chest pain, shortness of breath, exercise intolerance, palpitations, loss of consciousness Resp:  Negative for cough, wheezing, shortness of breath GI:  Negative for nausea, vomiting, diarrhea, bloody stools GU:  Positives noted in HPI; otherwise negative for gross hematuria, dysuria, urinary incontinence Neuro:  Negative for seizures, poor balance, limb weakness, slurred speech Psych:  Negative for lack of energy, depression, anxiety Endocrine:  Negative for polydipsia, polyuria,  symptoms of hypoglycemia (dizziness, hunger, sweating) Hematologic:  Negative for anemia, purpura, petechia, prolonged or excessive bleeding, use of anticoagulants  Allergic:  Negative for difficulty breathing or choking as a result of exposure to anything; no shellfish allergy; no allergic response (rash/itch) to materials, foods  Physical exam: There were no vitals taken for this visit. GENERAL APPEARANCE:  Well appearing, well developed, well nourished, NAD HEENT: Atraumatic, Normocephalic. NECK: Normal appearance LUNGS: Normal inspiratory and expiratory excursion HEART: Regular Rate EXTREMITIES: Moves all extremities well.  Without clubbing, cyanosis, or edema. NEUROLOGIC:  Alert and oriented x 3, normal gait, CN II-XII grossly intact.  MENTAL STATUS:  Appropriate. SKIN:  Warm, dry and intact.    Results: Results for orders placed or performed during the hospital encounter of 09/02/23 (from the past 24 hours)  Urinalysis, Routine w reflex microscopic -Urine, Clean Catch   Collection Time: 09/02/23 10:31 AM  Result Value Ref Range   Color, Urine AMBER (A) YELLOW   APPearance CLOUDY (A) CLEAR   Specific Gravity, Urine 1.036 (H) 1.005 - 1.030   pH 5.0 5.0 - 8.0   Glucose, UA NEGATIVE NEGATIVE mg/dL   Hgb urine dipstick SMALL (A) NEGATIVE   Bilirubin Urine NEGATIVE NEGATIVE   Ketones, ur NEGATIVE NEGATIVE mg/dL   Protein, ur 161 (A) NEGATIVE mg/dL   Nitrite NEGATIVE NEGATIVE   Leukocytes,Ua LARGE (A) NEGATIVE   RBC / HPF 21-50 0 - 5 RBC/hpf   WBC, UA >50 0 - 5 WBC/hpf   Bacteria, UA NONE SEEN NONE SEEN   Squamous Epithelial / HPF 6-10 0 - 5 /HPF   WBC Clumps PRESENT    Mucus PRESENT    Hyaline Casts, UA PRESENT     I have reviewed prior patient's records  I have reviewed referring/prior physicians records  I have reviewed urinalysis  I have reviewed prior urine cultures  I reviewed prior imaging studies--CT images from November 2024 reviewed.  Renal appearance is  normal.  No scarring, no stones, no hydronephrosis.  Bladder also appears normal.  Residual urine volume minimal Assessment: - Frequent urinary tract infections in the 78 year old female, they seem uncomplicated  -Vaginal atrophic changes   Plan: -I recommended that the patient start on perivaginal estrogen cream 3 nights a week  -She will continue the Keflex  as prescribed  -After the Keflex  is completed, she will start a 34-month course of methenamine hippurate  -I will have her come back in 3 months to recheck symptoms

## 2023-09-03 ENCOUNTER — Ambulatory Visit: Admitting: Urology

## 2023-09-03 VITALS — BP 142/88 | HR 92 | Ht 64.0 in | Wt 155.0 lb

## 2023-09-03 DIAGNOSIS — N390 Urinary tract infection, site not specified: Secondary | ICD-10-CM

## 2023-09-03 DIAGNOSIS — N952 Postmenopausal atrophic vaginitis: Secondary | ICD-10-CM

## 2023-09-03 DIAGNOSIS — Z87440 Personal history of urinary (tract) infections: Secondary | ICD-10-CM

## 2023-09-03 DIAGNOSIS — N3020 Other chronic cystitis without hematuria: Secondary | ICD-10-CM

## 2023-09-03 LAB — BLADDER SCAN AMB NON-IMAGING: Scan Result: 45

## 2023-09-03 MED ORDER — METHENAMINE HIPPURATE 1 G PO TABS: 1.0000 g | ORAL_TABLET | Freq: Two times a day (BID) | ORAL | 11 refills | Status: AC

## 2023-09-03 MED ORDER — ESTRADIOL 0.1 MG/GM VA CREA: TOPICAL_CREAM | VAGINAL | 3 refills | Status: AC

## 2023-09-04 ENCOUNTER — Ambulatory Visit: Payer: Medicare PPO | Admitting: Family Medicine
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
## 2023-09-17 ENCOUNTER — Encounter: Payer: Self-pay | Admitting: Neurology

## 2023-09-17 ENCOUNTER — Ambulatory Visit: Admitting: Neurology

## 2023-09-17 ENCOUNTER — Other Ambulatory Visit

## 2023-09-17 VITALS — BP 128/84 | HR 95 | Ht 62.0 in | Wt 154.8 lb

## 2023-09-17 DIAGNOSIS — G629 Polyneuropathy, unspecified: Secondary | ICD-10-CM

## 2023-09-17 DIAGNOSIS — M797 Fibromyalgia: Secondary | ICD-10-CM

## 2023-09-17 DIAGNOSIS — R4189 Other symptoms and signs involving cognitive functions and awareness: Secondary | ICD-10-CM

## 2023-09-17 DIAGNOSIS — Z72820 Sleep deprivation: Secondary | ICD-10-CM

## 2023-09-17 DIAGNOSIS — F339 Major depressive disorder, recurrent, unspecified: Secondary | ICD-10-CM | POA: Diagnosis not present

## 2023-09-17 MED ORDER — DULOXETINE HCL 30 MG PO CPEP: 30.0000 mg | ORAL_CAPSULE | Freq: Every day | ORAL | 5 refills | Status: AC

## 2023-09-17 NOTE — Patient Instructions (Signed)
 I saw you today for pain in your legs and cognitive changes.  I would like to get lab work today.  I also want to get cognitive testing. You can schedule this before you leave.  Your memory problems could be made worse by poor sleep and mood. We discussed going to a sleep doctor, but you wanted to hold off for now.  For your pain, I will start a low dose of a medication called Cymbalta . You will take 30 mg at bedtime. This can help neuropathy, fibromyalgia, and depression.  I will be in touch when I have your results. I will see you back in clinic to check your progress in about 3 months.  Please let me know if you have any questions or concerns in the meantime.  The physicians and staff at [facility] are committed to providing excellent care. You may receive a survey requesting feedback about your experience at our office. We strive to receive very good responses to the survey questions. If you feel that your experience would prevent you from giving the office a very good  response, please contact our office to try to remedy the situation. We may be reached at [phone]. Thank you for taking the time out of your busy day to complete the survey.  Kayla Coats, MD [facility]  Preventing Falls at [facility] are common, often dreaded events in the lives of older people. Aside from the obvious injuries and even death that may result, fall can cause wide-ranging consequences including loss of independence, mental decline, decreased activity and mobility. Younger people are also at risk of falling, especially those with chronic illnesses and fatigue.  Ways to reduce risk for falling Examine diet and medications. Warm foods and alcohol dilate blood vessels, which can lead to dizziness when standing. Sleep aids, antidepressants and pain medications can also increase the likelihood of a fall.  Get a vision exam. Poor vision, cataracts and glaucoma increase the chances of  falling.  Check foot gear. Shoes should fit snugly and have a sturdy, nonskid sole and a broad, low heel  Participate in a physician-approved exercise program to build and maintain muscle strength and improve balance and coordination. Programs that use ankle weights or stretch bands are excellent for muscle-strengthening. Water aerobics programs and low-impact Tai Chi programs have also been shown to improve balance and coordination.  Increase vitamin D  intake. Vitamin D  improves muscle strength and increases the amount of calcium  the body is able to absorb and deposit in bones.  How to prevent falls from common hazards Floors - Remove all loose wires, cords, and throw rugs. Minimize clutter. Make sure rugs are anchored and smooth. Keep furniture in its usual place.  Chairs -- Use chairs with straight backs, armrests and firm seats. Add firm cushions to existing pieces to add height.  Bathroom - Install grab bars and non-skid tape in the tub or shower. Use a bathtub transfer bench or a shower chair with a back support Use an elevated toilet seat and/or safety rails to assist standing from a low surface. Do not use towel racks or bathroom tissue holders to help you stand.  Lighting - Make sure halls, stairways, and entrances are well-lit. Install a night light in your bathroom or hallway. Make sure there is a light switch at the top and bottom of the staircase. Turn lights on if you get up in the middle of the night. Make sure lamps or light switches are within reach of  the bed if you have to get up during the night.  Kitchen - Install non-skid rubber mats near the sink and stove. Clean spills immediately. Store frequently used utensils, pots, pans between waist and eye level. This helps prevent reaching and bending. Sit when getting things out of lower cupboards.  Living room/ Bedrooms - Place furniture with wide spaces in between, giving enough room to move around. Establish a route through the  living room that gives you something to hold onto as you walk.  Stairs - Make sure treads, rails, and rugs are secure. Install a rail on both sides of the stairs. If stairs are a threat, it might be helpful to arrange most of your activities on the lower level to reduce the number of times you must climb the stairs.  Entrances and doorways - Install metal handles on the walls adjacent to the doorknobs of all doors to make it more secure as you travel through the doorway.  Tips for maintaining balance Keep at least one hand free at all times. Try using a backpack or fanny pack to hold things rather than carrying them in your hands. Never carry objects in both hands when walking as this interferes with keeping your balance.  Attempt to swing both arms from front to back while walking. This might require a conscious effort if Parkinson's disease has diminished your movement. It will, however, help you to maintain balance and posture, and reduce fatigue.  Consciously lift your feet off of the ground when walking. Shuffling and dragging of the feet is a common culprit in losing your balance.  When trying to navigate turns, use a U technique of facing forward and making a wide turn, rather than pivoting sharply.  Try to stand with your feet shoulder-length apart. When your feet are close together for any length of time, you increase your risk of losing your balance and falling.  Do one thing at a time. Don't try to walk and accomplish another task, such as reading or looking around. The decrease in your automatic reflexes complicates motor function, so the less distraction, the better.  Do not wear rubber or gripping soled shoes, they might catch on the floor and cause tripping.  Move slowly when changing positions. Use deliberate, concentrated movements and, if needed, use a grab bar or walking aid. Count 15 seconds between each movement. For example, when rising from a seated position, wait 15  seconds after standing to begin walking.  If balance is a continuous problem, you might want to consider a walking aid such as a cane, walking stick, or walker. Once you've mastered walking with help, you might be ready to try it on your own again.

## 2023-09-18 NOTE — Addendum Note (Signed)
 Addended by: Arturo Bing on: 09/18/2023 10:03 AM   Modules accepted: Orders

## 2023-09-22 ENCOUNTER — Ambulatory Visit: Payer: Self-pay | Admitting: Neurology

## 2023-09-22 LAB — IMMUNOFIXATION ELECTROPHORESIS
IgG (Immunoglobin G), Serum: 951 mg/dL (ref 600–1540)
IgM, Serum: 81 mg/dL (ref 50–300)
Immunoglobulin A: 227 mg/dL (ref 70–320)

## 2023-09-22 LAB — VITAMIN B1: Vitamin B1 (Thiamine): 9 nmol/L (ref 8–30)

## 2023-09-25 ENCOUNTER — Emergency Department (HOSPITAL_COMMUNITY)
Admission: EM | Admit: 2023-09-25 | Discharge: 2023-09-25 | Disposition: A | Discharge status: Home / Self Care | Attending: Emergency Medicine | Admitting: Emergency Medicine

## 2023-09-25 ENCOUNTER — Emergency Department (HOSPITAL_COMMUNITY)

## 2023-09-25 ENCOUNTER — Encounter (HOSPITAL_COMMUNITY): Payer: Self-pay

## 2023-09-25 DIAGNOSIS — N3000 Acute cystitis without hematuria: Secondary | ICD-10-CM | POA: Diagnosis not present

## 2023-09-25 DIAGNOSIS — N3090 Cystitis, unspecified without hematuria: Secondary | ICD-10-CM | POA: Diagnosis not present

## 2023-09-25 DIAGNOSIS — Z79899 Other long term (current) drug therapy: Secondary | ICD-10-CM | POA: Diagnosis not present

## 2023-09-25 DIAGNOSIS — I10 Essential (primary) hypertension: Secondary | ICD-10-CM | POA: Diagnosis not present

## 2023-09-25 DIAGNOSIS — R300 Dysuria: Secondary | ICD-10-CM | POA: Diagnosis present

## 2023-09-25 DIAGNOSIS — R519 Headache, unspecified: Secondary | ICD-10-CM | POA: Diagnosis not present

## 2023-09-25 DIAGNOSIS — I602 Nontraumatic subarachnoid hemorrhage from anterior communicating artery: Secondary | ICD-10-CM | POA: Diagnosis not present

## 2023-09-25 DIAGNOSIS — G44309 Post-traumatic headache, unspecified, not intractable: Secondary | ICD-10-CM | POA: Diagnosis not present

## 2023-09-25 DIAGNOSIS — I6522 Occlusion and stenosis of left carotid artery: Secondary | ICD-10-CM | POA: Diagnosis not present

## 2023-09-25 DIAGNOSIS — I671 Cerebral aneurysm, nonruptured: Secondary | ICD-10-CM

## 2023-09-25 DIAGNOSIS — I672 Cerebral atherosclerosis: Secondary | ICD-10-CM | POA: Diagnosis not present

## 2023-09-25 LAB — CBC WITH DIFFERENTIAL/PLATELET
Abs Immature Granulocytes: 0.02 10*3/uL (ref 0.00–0.07)
Basophils Absolute: 0.1 10*3/uL (ref 0.0–0.1)
Basophils Relative: 1 %
Eosinophils Absolute: 0.1 10*3/uL (ref 0.0–0.5)
Eosinophils Relative: 1 %
HCT: 45.5 % (ref 36.0–46.0)
Hemoglobin: 14.7 g/dL (ref 12.0–15.0)
Immature Granulocytes: 0 %
Lymphocytes Relative: 26 %
Lymphs Abs: 2.7 10*3/uL (ref 0.7–4.0)
MCH: 32.1 pg (ref 26.0–34.0)
MCHC: 32.3 g/dL (ref 30.0–36.0)
MCV: 99.3 fL (ref 80.0–100.0)
Monocytes Absolute: 0.6 10*3/uL (ref 0.1–1.0)
Monocytes Relative: 6 %
Neutro Abs: 7.1 10*3/uL (ref 1.7–7.7)
Neutrophils Relative %: 66 %
Platelets: 302 10*3/uL (ref 150–400)
RBC: 4.58 MIL/uL (ref 3.87–5.11)
RDW: 12.5 % (ref 11.5–15.5)
WBC: 10.5 10*3/uL (ref 4.0–10.5)
nRBC: 0 % (ref 0.0–0.2)

## 2023-09-25 LAB — URINALYSIS, W/ REFLEX TO CULTURE (INFECTION SUSPECTED)
Bacteria, UA: NONE SEEN
Bilirubin Urine: NEGATIVE
Glucose, UA: NEGATIVE mg/dL
Hgb urine dipstick: NEGATIVE
Ketones, ur: NEGATIVE mg/dL
Nitrite: NEGATIVE
Protein, ur: NEGATIVE mg/dL
Specific Gravity, Urine: 1.01 (ref 1.005–1.030)
WBC, UA: >50 WBC/hpf (ref 0–5)
pH: 5 (ref 5.0–8.0)

## 2023-09-25 LAB — COMPREHENSIVE METABOLIC PANEL WITH GFR
ALT: 9 U/L (ref 0–44)
AST: 14 U/L — ABNORMAL LOW (ref 15–41)
Albumin: 4.5 g/dL (ref 3.5–5.0)
Alkaline Phosphatase: 94 U/L (ref 38–126)
Anion gap: 10 (ref 5–15)
BUN: 12 mg/dL (ref 8–23)
CO2: 24 mmol/L (ref 22–32)
Calcium: 9.8 mg/dL (ref 8.9–10.3)
Chloride: 106 mmol/L (ref 98–111)
Creatinine, Ser: 0.92 mg/dL (ref 0.44–1.00)
GFR, Estimated: >60 mL/min (ref >60)
Glucose, Bld: 104 mg/dL — ABNORMAL HIGH (ref 70–99)
Potassium: 4 mmol/L (ref 3.5–5.1)
Sodium: 140 mmol/L (ref 135–145)
Total Bilirubin: 0.8 mg/dL (ref 0.0–1.2)
Total Protein: 7.7 g/dL (ref 6.5–8.1)

## 2023-09-25 MED ORDER — IOHEXOL 350 MG/ML SOLN
75.0000 mL | Freq: Once | INTRAVENOUS | Status: AC | PRN
  Administered 2023-09-25: 75 mL via INTRAVENOUS

## 2023-09-25 MED ORDER — CEPHALEXIN 500 MG PO CAPS: 500.0000 mg | ORAL_CAPSULE | Freq: Four times a day (QID) | ORAL | 0 refills | Status: AC

## 2023-09-25 NOTE — ED Triage Notes (Addendum)
 Pt c/o dysuria and bladder pressure r/t chronic UTIs and headache r/t falls in November and last month.  Pain score 5/10.  Pt reports taking Tylenol  w/o relief.    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

## 2023-09-25 NOTE — Discharge Instructions (Signed)
 You are seen in the emergency department today for concerns of painful urination and a headache.  You did appear to have an infection so I am starting you on a course of antibiotics.  Please take this as prescribed.  Your urine culture will be available in the neck several days so if there is any need for change with your antibiotic selection, this change we may need to be informed.  Your CT scan of your head did not show signs of an aneurysm to the anterior communicating artery. Please follow up with neurosurgery regarding this findings. For any concerns of sudden severe headache, return to the ER.

## 2023-09-25 NOTE — ED Provider Notes (Cosign Needed)
 [place] [facility]ERGENCY DEPARTMENT AT [facility] Provider Note   CSN: [id number] Arrival date & time: 09/25/23  4782     Patient presents with: Dysuria and Headache   Kayla [name] is a 78 y.o. female.  Patient past history significant for hyperlipidemia, hypertension, fibromyalgia, osteopenia, recurrent UTIs presents to the emergency department with concerns of dysuria.  She reports that she has had recurrent UTIs for quite some time and typically begins to notice dysuria and increased urinary frequency and urgency but never notices any obvious gross hematuria.  She also reports recurrent headaches related to a fall back in November as well as last month and currently rates the headache pain a 5 out of 10.  She describes the pain in her head as a pressure feeling left ear that comes from the neck.  Denies any chest pain or shortness of breath.   Dysuria Headache      Prior to Admission medications   Medication Sig Start Date End Date Taking? Authorizing Provider  cephALEXin  (KEFLEX ) 500 MG capsule Take 1 capsule (500 mg total) by mouth 4 (four) times daily for 5 days. 09/25/23 09/30/23 Yes Devone Bonilla A, PA-C  acetaminophen  (TYLENOL ) 500 MG tablet Take 1,000 mg by mouth in the morning, at noon, in the evening, and at bedtime.    [provider]  amLODipine  (NORVASC ) 5 MG tablet Take 1 tablet (5 mg total) by mouth daily. 11/15/22   Sheryle Donning, MD  Azelastine -Fluticasone  137-50 MCG/ACT SUSP Place 1 spray into the nose every 12 (twelve) hours. 07/10/23   Lowne Chase, Yvonne R, DO  bismuth subsalicylate (PEPTO BISMOL) 262 MG/15ML suspension Take 30 mLs by mouth every 6 (six) hours as needed for indigestion or diarrhea or loose stools.    [provider]  Cholecalciferol (VITAMIN D3) 1.25 MG (50000 UT) CAPS Take 1 capsule by mouth once a week. Sunday 07/22/22   [provider]  clotrimazole -betamethasone  (LOTRISONE ) cream Apply 1 Application topically  daily. 08/14/23   Roel Clarity R, DO  [employer] VITAMIN B12 1000 MCG tablet Take 2,000 mcg by mouth daily.    [provider]  DULoxetine  (CYMBALTA ) 30 MG capsule Take 1 capsule (30 mg total) by mouth at bedtime. 09/17/23   Ellene Gustin, MD  estradiol  (ESTRACE ) 0.1 MG/GM vaginal cream Apply a pea-sized amount of cream to your vaginal opening using your index finger 3 nights a week 09/03/23   Trent Frizzle, MD  hydrALAZINE  (APRESOLINE ) 10 MG tablet Take 1 tablet (10 mg total) by mouth 3 (three) times daily as needed (for elevated blood pressure (top number more than 140, bottom number more than 90)). 02/14/23   Sheryle Donning, MD  hydrOXYzine  (ATARAX ) 25 MG tablet Take 1 tablet (25 mg total) by mouth every 8 (eight) hours as needed for itching. 04/27/23   Levora Reas A, NP  levothyroxine  (SYNTHROID ) 175 MCG tablet Take 175 mcg by mouth daily before breakfast.    [provider]  methenamine  (HIPREX ) 1 g tablet Take 1 tablet (1 g total) by mouth 2 (two) times daily with a meal. 09/03/23   Trent Frizzle, MD  nystatin  cream (MYCOSTATIN ) Apply to affected area 2 times daily 04/27/23   Levora Reas A, NP  ondansetron  (ZOFRAN -ODT) 4 MG disintegrating tablet Take 1 tablet (4 mg total) by mouth every 8 (eight) hours as needed. 08/03/23   Kelsey Patricia, MD  pantoprazole  (PROTONIX ) 40 MG tablet Take 1 tablet (40 mg total) by mouth daily. 07/10/23  Crecencio Dodge, Yvonne R, DO  potassium chloride  SA (KLOR-CON  M) 20 MEQ tablet Take 1 tablet (20 mEq total) by mouth daily. 08/18/23   Lowne Chase, Yvonne R, DO    Allergies: Ace inhibitors, Lisinopril, and Ciprofloxacin     Review of Systems  Genitourinary:  Positive for dysuria.  Neurological:  Positive for headaches.  All other systems reviewed and are negative.   Updated Vital Signs BP (!) 148/83   Pulse 75   Temp 97.6 F (36.4 C) (Oral)   Resp 12   Ht 5' 2 (1.575 m)   Wt 69.9 kg   SpO2 100%   BMI 28.17 kg/m    Physical Exam Vitals and nursing note reviewed.  Constitutional:      General: She is not in acute distress.    Appearance: She is well-developed.  HENT:     Head: Normocephalic and atraumatic.     Right Ear: Tympanic membrane and ear canal normal.     Left Ear: Tympanic membrane and ear canal normal.   Eyes:     Conjunctiva/sclera: Conjunctivae normal.   Neck:     Comments: Pain towards left side of neck worsens with rotation. Cardiovascular:     Rate and Rhythm: Normal rate and regular rhythm.     Heart sounds: No murmur heard. Pulmonary:     Effort: Pulmonary effort is normal. No respiratory distress.     Breath sounds: Normal breath sounds. No wheezing or rales.  Abdominal:     General: Bowel sounds are normal. There is no distension.     Palpations: Abdomen is soft. There is no mass.     Tenderness: There is no abdominal tenderness. There is no guarding.   Musculoskeletal:        General: No swelling.     Cervical back: Neck supple.   Skin:    General: Skin is warm and dry.     Capillary Refill: Capillary refill takes less than 2 seconds.   Neurological:     Mental Status: She is alert.   Psychiatric:        Mood and Affect: Mood normal.     (all labs ordered are listed, but only abnormal results are displayed) Labs Reviewed  URINALYSIS, W/ REFLEX TO CULTURE (INFECTION SUSPECTED) - Abnormal; Notable for the following components:      Result Value   APPearance CLOUDY (*)    Leukocytes,Ua LARGE (*)    All other components within normal limits  COMPREHENSIVE METABOLIC PANEL WITH GFR - Abnormal; Notable for the following components:   Glucose, Bld 104 (*)    AST 14 (*)    All other components within normal limits  URINE CULTURE  CBC WITH DIFFERENTIAL/PLATELET    EKG: None  Radiology: CT Angio Head W or Wo Contrast Result Date: 09/25/2023 CLINICAL DATA:  Headache, posttraumatic. Frequent falls since November. Pain in left side of neck. EXAM: CT  ANGIOGRAPHY HEAD TECHNIQUE: Multidetector CT imaging of the head was performed using the standard protocol during bolus administration of intravenous contrast. Multiplanar CT image reconstructions and MIPs were obtained to evaluate the vascular anatomy. RADIATION DOSE REDUCTION: This exam was performed according to the departmental dose-optimization program which includes automated exposure control, adjustment of the mA and/or kV according to patient size and/or use of iterative reconstruction technique. CONTRAST:  75mL OMNIPAQUE  IOHEXOL  350 MG/ML SOLN COMPARISON:  CT head and maxillofacial 08/23/2023. FINDINGS: CT HEAD FINDINGS Brain: No acute intracranial hemorrhage. No CT evidence of acute infarct. No edema,  mass effect, or midline shift. The basilar cisterns are patent. Ventricles: Ventricles are normal in size and configuration. Vascular: No hyperdense vessel. Intracranial atherosclerotic calcifications noted. Skull: No acute or aggressive finding. Sinuses/orbits: The visualized paranasal sinuses are clear. Orbits are symmetric. Other: Small right mastoid effusion. CTA HEAD FINDINGS ANTERIOR CIRCULATION: The intracranial ICAs are patent bilaterally. Mild atherosclerosis of the carotid siphons. There is mild stenosis of the left cavernous ICA. No severe stenosis, proximal occlusion, aneurysm, or vascular malformation. MCAs: The middle cerebral arteries are patent bilaterally. ACAs: Patent bilaterally. 2 mm outpouching at the junction of the A1 and A2 segments of the left ACA concerning for anterior communicating artery aneurysm. POSTERIOR CIRCULATION: No significant stenosis, proximal occlusion, aneurysm, or vascular malformation. PCAs: The posterior cerebral arteries are patent bilaterally. Fetal origin of the right PCA. Pcomm: Visualized on the right. SCAs: The superior cerebellar arteries are patent bilaterally. Basilar artery: Patent AICAs: Patent PICAs: Patent Vertebral arteries: The intracranial vertebral  arteries are patent. Venous sinuses: As permitted by contrast timing, patent. Anatomic variants: None Review of the MIP images confirms the above findings IMPRESSION: No large vessel occlusion. No CT evidence of acute intracranial abnormality. 2 mm outpouching at the junction of the A1 and A2 segments of the left ACA concerning for anterior communicating artery aneurysm. Mild atherosclerosis of the carotid siphons. Mild focal stenosis of the left cavernous ICA. Electronically Signed   By: Denny Flack M.D.   On: 09/25/2023 15:47     Procedures   Medications Ordered in the ED  iohexol  (OMNIPAQUE ) 350 MG/ML injection 75 mL (75 mLs Intravenous Contrast Given 09/25/23 1454)                                    Medical Decision Making Amount and/or Complexity of Data Reviewed Labs: ordered. Radiology: ordered.  Risk Prescription drug management.   This patient presents to the ED for concern of dysuria, headache.  Differential diagnosis includes UTI, pyelonephritis, urosepsis, stroke, SAH, AVM   Lab Tests:  I Ordered, and personally interpreted labs.  The pertinent results include: Urinalysis inconsistent with infection although urine is cloudy and leukocytes are present, CBC unremarkable, CMP unremarkable   Imaging Studies ordered:  I ordered imaging studies including CT angio head and neck I independently visualized and interpreted imaging which showed No large vessel occlusion. No CT evidence of acute intracranial abnormality. 2 mm outpouching at the junction of the A1 and A2 segments of the left ACA concerning for anterior communicating artery aneurysm. Mild atherosclerosis of the carotid siphons. Mild focal stenosis of the left cavernous ICA. I agree with the radiologist interpretation   Problem List / ED Course:  Patient presents the emergency department with concerns of dysuria and a headache.  History significant for hyperlipidemia, hypertension, fibromyalgia, osteopenia, and  recurrent UTIs.  She reports that she began to notice burning with urination over the last several days.  States she has a history of chronic UTIs that typically are well-managed with Keflex .  Also endorsing persistence of a worsening headache since she was evaluated in the ED. She describes a fullness feeling the left ear as well as pressure and pain in the left side of her neck that worsens with movement of the neck.  On exam, patient is well-appearing.  Some mild suprapubic tenderness.  No significant worsening of headache with rotation of the neck he does report worsening pain on the left side  of her neck with movement.  Can flex and extend neck without difficulty.  No midline tenderness or step-off deformities. Urinalysis ordered from triage which shows some leukocytes and cloudy urine but no obvious bacteria seen.  Given symptomatic presentation, will send off urine for culture and initiate antibiotic therapy.  Added on CBC and CMP for further evaluation.  Will also assess headache and neck pain with CT angio head and neck for any possible vascular compromise causing her prolonged course of symptoms. 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 Given this finding, patient will benefit from neurosurgery evaluation and management. Will discharge home with a course of Keflex  QID and discussed return precautions. Discharged home in stable condition with planned outpatient follow up.   Final diagnoses:  Acute cystitis without hematuria  Aneurysm of anterior communicating artery    ED Discharge Orders          Ordered    cephALEXin  (KEFLEX ) 500 MG capsule  4 times daily        09/25/23 1653               Daquane Aguilar A, PA-C 09/25/23 1655

## 2023-09-25 NOTE — ED Notes (Signed)
 Endorses that she has had frequent falls since November and the last one notable since may. Pt reported a pain in her neck on the L side while waiting in the waiting room, provider in room assessing with RN. Pt states she has lumps under her skin on top of head that are sensitive, even to shower water

## 2023-09-27 LAB — URINE CULTURE
Culture: 50000 — AB
Special Requests: NORMAL

## 2023-09-28 ENCOUNTER — Telehealth (HOSPITAL_BASED_OUTPATIENT_CLINIC_OR_DEPARTMENT_OTHER): Payer: Self-pay

## 2023-09-28 NOTE — Telephone Encounter (Signed)
 Post ED Visit - Positive Culture Follow-up  Culture report reviewed by antimicrobial stewardship pharmacist: [facility] [x]  Abby [place], [place].D. []  Venetia Gully, Pharm.D., BCPS AQ-ID []  Garrel Crews, Pharm.D., BCPS []  Almarie Lunger, Pharm.D., BCPS []  [place], [address].D., BCPS, AAHIVP []  Rosaline Bihari, Pharm.D., BCPS, AAHIVP []  Vernell Meier, PharmD, BCPS []  Latanya Hint, PharmD, BCPS []  Donald Medley, PharmD, BCPS []  Rocky Bold, PharmD []  Dorothyann Alert, PharmD, BCPS []  Morene Babe, PharmD  [facility] []  Rosaline Edison, PharmD []  Romona Bliss, PharmD []  Dolphus Roller, PharmD []  Veva Seip, Rph []  Vernell Daunt) Leonce, PharmD []  Eva Allis, PharmD []  Rosaline Millet, PharmD []  Iantha Batch, PharmD []  Arvin Gauss, PharmD []  Wanda Hasting, PharmD []  Ronal Rav, PharmD []  Rocky Slade, PharmD []  Bard Jeans, PharmD   Positive urine culture Treated with Cephalexin , organism sensitive to the same and no further patient follow-up is required at this time.  Ruth Camelia Elbe 09/28/2023, 9:14 AM

## 2023-09-30 DIAGNOSIS — R1319 Other dysphagia: Secondary | ICD-10-CM | POA: Diagnosis not present

## 2023-09-30 DIAGNOSIS — R634 Abnormal weight loss: Secondary | ICD-10-CM | POA: Diagnosis not present

## 2023-09-30 DIAGNOSIS — K5909 Other constipation: Secondary | ICD-10-CM | POA: Diagnosis not present

## 2023-09-30 DIAGNOSIS — R6889 Other general symptoms and signs: Secondary | ICD-10-CM | POA: Diagnosis not present

## 2023-10-05 ENCOUNTER — Emergency Department (HOSPITAL_COMMUNITY)

## 2023-10-05 ENCOUNTER — Other Ambulatory Visit: Payer: Self-pay

## 2023-10-05 ENCOUNTER — Emergency Department (HOSPITAL_COMMUNITY)
Admission: EM | Admit: 2023-10-05 | Discharge: 2023-10-05 | Disposition: A | Discharge status: Home / Self Care | Attending: Emergency Medicine | Admitting: Emergency Medicine

## 2023-10-05 DIAGNOSIS — N3000 Acute cystitis without hematuria: Secondary | ICD-10-CM | POA: Diagnosis not present

## 2023-10-05 DIAGNOSIS — R1030 Lower abdominal pain, unspecified: Secondary | ICD-10-CM | POA: Diagnosis present

## 2023-10-05 DIAGNOSIS — I10 Essential (primary) hypertension: Secondary | ICD-10-CM | POA: Diagnosis not present

## 2023-10-05 DIAGNOSIS — E039 Hypothyroidism, unspecified: Secondary | ICD-10-CM | POA: Insufficient documentation

## 2023-10-05 DIAGNOSIS — Z79899 Other long term (current) drug therapy: Secondary | ICD-10-CM | POA: Insufficient documentation

## 2023-10-05 DIAGNOSIS — J45909 Unspecified asthma, uncomplicated: Secondary | ICD-10-CM | POA: Diagnosis not present

## 2023-10-05 DIAGNOSIS — I671 Cerebral aneurysm, nonruptured: Secondary | ICD-10-CM | POA: Insufficient documentation

## 2023-10-05 DIAGNOSIS — Z8616 Personal history of COVID-19: Secondary | ICD-10-CM | POA: Insufficient documentation

## 2023-10-05 LAB — URINALYSIS, W/ REFLEX TO CULTURE (INFECTION SUSPECTED)
Bilirubin Urine: NEGATIVE
Glucose, UA: NEGATIVE mg/dL
Hgb urine dipstick: NEGATIVE
Ketones, ur: NEGATIVE mg/dL
Nitrite: NEGATIVE
Protein, ur: 30 mg/dL — AB
Specific Gravity, Urine: 1.033 — ABNORMAL HIGH (ref 1.005–1.030)
pH: 5 (ref 5.0–8.0)

## 2023-10-05 LAB — CBC WITH DIFFERENTIAL/PLATELET
Abs Immature Granulocytes: 0.02 10*3/uL (ref 0.00–0.07)
Basophils Absolute: 0.1 10*3/uL (ref 0.0–0.1)
Basophils Relative: 1 %
Eosinophils Absolute: 0.2 10*3/uL (ref 0.0–0.5)
Eosinophils Relative: 2 %
HCT: 42.7 % (ref 36.0–46.0)
Hemoglobin: 13.8 g/dL (ref 12.0–15.0)
Immature Granulocytes: 0 %
Lymphocytes Relative: 37 %
Lymphs Abs: 3.3 10*3/uL (ref 0.7–4.0)
MCH: 31.7 pg (ref 26.0–34.0)
MCHC: 32.3 g/dL (ref 30.0–36.0)
MCV: 98.2 fL (ref 80.0–100.0)
Monocytes Absolute: 0.7 10*3/uL (ref 0.1–1.0)
Monocytes Relative: 7 %
Neutro Abs: 4.8 10*3/uL (ref 1.7–7.7)
Neutrophils Relative %: 53 %
Platelets: 266 10*3/uL (ref 150–400)
RBC: 4.35 MIL/uL (ref 3.87–5.11)
RDW: 12.5 % (ref 11.5–15.5)
WBC: 9 10*3/uL (ref 4.0–10.5)
nRBC: 0 % (ref 0.0–0.2)

## 2023-10-05 LAB — COMPREHENSIVE METABOLIC PANEL WITH GFR
ALT: 10 U/L (ref 0–44)
AST: 13 U/L — ABNORMAL LOW (ref 15–41)
Albumin: 3.6 g/dL (ref 3.5–5.0)
Alkaline Phosphatase: 78 U/L (ref 38–126)
Anion gap: 8 (ref 5–15)
BUN: 10 mg/dL (ref 8–23)
CO2: 24 mmol/L (ref 22–32)
Calcium: 9.2 mg/dL (ref 8.9–10.3)
Chloride: 109 mmol/L (ref 98–111)
Creatinine, Ser: 0.65 mg/dL (ref 0.44–1.00)
GFR, Estimated: >60 mL/min (ref >60)
Glucose, Bld: 101 mg/dL — ABNORMAL HIGH (ref 70–99)
Potassium: 4.1 mmol/L (ref 3.5–5.1)
Sodium: 141 mmol/L (ref 135–145)
Total Bilirubin: 0.5 mg/dL (ref 0.0–1.2)
Total Protein: 6.3 g/dL — ABNORMAL LOW (ref 6.5–8.1)

## 2023-10-05 MED ORDER — IOHEXOL 350 MG/ML SOLN
75.0000 mL | Freq: Once | INTRAVENOUS | Status: AC | PRN
  Administered 2023-10-05: 75 mL via INTRAVENOUS

## 2023-10-05 MED ORDER — CEFTRIAXONE SODIUM 1 G IJ SOLR
1.0000 g | Freq: Once | INTRAMUSCULAR | Status: AC
  Administered 2023-10-05: 1 g via INTRAMUSCULAR
  Filled 2023-10-05: qty 10

## 2023-10-05 MED ORDER — LIDOCAINE HCL (PF) 1 % IJ SOLN
2.0000 mL | Freq: Once | INTRAMUSCULAR | Status: AC
  Administered 2023-10-05: 2 mL
  Filled 2023-10-05: qty 30

## 2023-10-05 MED ORDER — SODIUM CHLORIDE 0.9 % IV SOLN
1.0000 g | Freq: Once | INTRAVENOUS | Status: DC
  Filled 2023-10-05: qty 10

## 2023-10-05 MED ORDER — ACETAMINOPHEN 500 MG PO TABS
1000.0000 mg | ORAL_TABLET | Freq: Once | ORAL | Status: AC
  Administered 2023-10-05: 1000 mg via ORAL
  Filled 2023-10-05: qty 2

## 2023-10-05 MED ORDER — CEFDINIR 300 MG PO CAPS: 300.0000 mg | ORAL_CAPSULE | Freq: Two times a day (BID) | ORAL | 0 refills | Status: AC

## 2023-10-05 NOTE — Discharge Instructions (Addendum)
 We evaluated you for your urinary symptoms and headache.  Your urinalysis did show signs of a UTI.  We gave you a dose of antibiotics in the ER and have prescribed you a prescription for oral antibiotics.  We also evaluated your brain aneurysm.  It has not changed in size.  Please schedule follow-up with Dr. Lanis.  If you have any new or worsening symptoms such as fevers, lightheadedness or dizziness, severe sudden onset headaches, vomiting, loss of consciousness or any other concerning symptoms please return to the emergency department.

## 2023-10-05 NOTE — ED Provider Notes (Signed)
 [place] EMERGENCY DEPARTMENT AT [facility] Provider Note  CSN: [id number] Arrival date & time: 10/05/23 1332  Chief Complaint(s) UTI  HPI Kayla [name] is a 78 y.o. female history of psoriatic arthritis, hypertension, hyperlipidemia, GERD presenting to the emergency department with painful urination.  Patient reports painful urination for the past few days, as well as some lower abdominal pressure with urination.  No abdominal pain.  No fevers or chills, back pain.  No nausea, vomiting.  Symptoms mild.  Patient reports she was also diagnosed recently with a brain aneurysm and is wondering who to follow-up with.  She was referred to [place] [facility], did not want to follow-up with them because she did not want to follow-up with specific doctor she did not like.  She reports intermittent chronic headaches which are unchanged.  No loss of consciousness, numbness or tingling, severe headache, sudden onset headache  Past Medical History Past Medical History:  Diagnosis Date   Anemia    Anxiety    Asthma    B12 deficiency    Bursitis    Chronic LBP    Chronic UTI    Diastolic dysfunction    Fibromyalgia    GERD (gastroesophageal reflux disease)    Hearing impaired person, bilateral    per pt   History of low potassium    Hyperlipemia    Hypertension    Hypothyroid    IBS (irritable bowel syndrome)    Inflammatory osteoarthritis    Kidney infection    Psoriatic arthritis (HCC)    S/P cardiac cath 11/30/2007   normal coronaries - Dr. Wonda (Dr. Mona reviewed films on 01/10/2016)   Patient Active Problem List   Diagnosis Date Noted   Rash 07/10/2023   Neuropathic pain of both legs 07/10/2023   Left ear pain 07/10/2023   Intractable chronic post-traumatic headache 07/10/2023   Hypomagnesemia 07/10/2023   Urine leukocytes increased 07/10/2023   Personal history of COVID-19 08/24/2020   Body mass index (BMI) 33.0-33.9, adult 06/20/2020   Status post lumbar  microdiscectomy 06/20/2020   Essential (primary) hypertension 06/20/2020   Bilateral leg edema 06/17/2020   Cellulitis of leg, left 06/17/2020   Cellulitis of leg, right 06/17/2020   Lumbar herniated disc 05/25/2020   Hyponatremia 05/12/2020   Sciatica 05/12/2020   COVID-19 05/09/2020   Hypokalemia 05/04/2020   Dysphagia 04/17/2020   Renal insufficiency 12/29/2018   Cyst of left ovary 12/29/2018   Esophageal dysmotility 12/29/2018   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/29/2018   Hypothyroidism 12/29/2018   Chronic maxillary sinusitis 07/07/2018   Snoring 06/01/2018   Costochondritis 06/01/2018   Family history of heart disease 06/01/2018   Class 2 severe obesity due to excess calories with serious comorbidity and body mass index (BMI) of 38.0 to 38.9 in adult (HCC) 06/01/2018   Multiple atypical skin moles 04/28/2017   Depression, recurrent (HCC) 07/16/2016   Adjustment disorder with mixed anxiety and depressed mood 01/15/2016   Fe Def Anemia 12/22/2015   Idiopathic scoliosis and kyphoscoliosis 12/20/2015   Osteopenia 12/20/2015   Fibromyalgia 08/02/2014   GAD (generalized anxiety disorder) 09/17/2012   Recurrent UTI 01/24/2012   Vitamin D  deficiency 01/24/2012   Fatigue 10/14/2011   Vitamin B12 deficiency    Hyperlipidemia 11/30/2007   Coronary artery disease involving native heart without angina pectoris 11/30/2007   Essential hypertension 01/27/2007   GERD 01/27/2007   IBS 01/27/2007   Home Medication(s) Prior to Admission medications   Medication Sig Start Date End Date  Taking? Authorizing Provider  cefdinir  (OMNICEF ) 300 MG capsule Take 1 capsule (300 mg total) by mouth 2 (two) times daily for 7 days. 10/05/23 10/12/23 Yes Francesca Elsie CROME, MD  acetaminophen  (TYLENOL ) 500 MG tablet Take 1,000 mg by mouth in the morning, at noon, in the evening, and at bedtime.    [provider]  amLODipine  (NORVASC ) 5 MG tablet Take 1 tablet (5 mg total) by  mouth daily. 11/15/22   Lonni Slain, MD  Azelastine -Fluticasone  137-50 MCG/ACT SUSP Place 1 spray into the nose every 12 (twelve) hours. 07/10/23   Lowne Chase, Yvonne R, DO  bismuth subsalicylate (PEPTO BISMOL) 262 MG/15ML suspension Take 30 mLs by mouth every 6 (six) hours as needed for indigestion or diarrhea or loose stools.    [provider]  Cholecalciferol (VITAMIN D3) 1.25 MG (50000 UT) CAPS Take 1 capsule by mouth once a week. Sunday 07/22/22   [provider]  clotrimazole -betamethasone  (LOTRISONE ) cream Apply 1 Application topically daily. 08/14/23   Antonio Cyndee Rockers R, DO  [employer] VITAMIN B12 1000 MCG tablet Take 2,000 mcg by mouth daily.    [provider]  DULoxetine  (CYMBALTA ) 30 MG capsule Take 1 capsule (30 mg total) by mouth at bedtime. 09/17/23   Leigh Venetia CROME, MD  estradiol  (ESTRACE ) 0.1 MG/GM vaginal cream Apply a pea-sized amount of cream to your vaginal opening using your index finger 3 nights a week 09/03/23   Matilda Senior, MD  hydrALAZINE  (APRESOLINE ) 10 MG tablet Take 1 tablet (10 mg total) by mouth 3 (three) times daily as needed (for elevated blood pressure (top number more than 140, bottom number more than 90)). 02/14/23   Lonni Slain, MD  hydrOXYzine  (ATARAX ) 25 MG tablet Take 1 tablet (25 mg total) by mouth every 8 (eight) hours as needed for itching. 04/27/23   Johnie Rumaldo LABOR, NP  levothyroxine  (SYNTHROID ) 175 MCG tablet Take 175 mcg by mouth daily before breakfast.    [provider]  methenamine  (HIPREX ) 1 g tablet Take 1 tablet (1 g total) by mouth 2 (two) times daily with a meal. 09/03/23   Matilda Senior, MD  nystatin  cream (MYCOSTATIN ) Apply to affected area 2 times daily 04/27/23   Johnie Rumaldo A, NP  ondansetron  (ZOFRAN -ODT) 4 MG disintegrating tablet Take 1 tablet (4 mg total) by mouth every 8 (eight) hours as needed. 08/03/23   Griselda Norris, MD  pantoprazole  (PROTONIX ) 40 MG tablet Take 1 tablet  (40 mg total) by mouth daily. 07/10/23   Lowne Chase, Yvonne R, DO  potassium chloride  SA (KLOR-CON  M) 20 MEQ tablet Take 1 tablet (20 mEq total) by mouth daily. 08/18/23   Antonio Cyndee Rockers JONELLE, DO                                                                                                                                    Past Surgical History Past Surgical History:  Procedure  Laterality Date   CESAREAN SECTION  06/10/1975   CHOLECYSTECTOMY     LUMBAR LAMINECTOMY/DECOMPRESSION MICRODISCECTOMY Right 05/25/2020   Procedure: MICRODISCECTOMY Right Lumbar three - four;  Surgeon: Mavis Purchase, MD;  Location: [facility] OR;  Service: Neurosurgery;  Laterality: Right;   NECK SURGERY  1998   RADIOLOGY WITH ANESTHESIA N/A 05/16/2020   Procedure: MRI LUMBAR SPINE W/CONTRAST;  Surgeon: Radiologist, Medication, MD;  Location: [facility] OR;  Service: Radiology;  Laterality: N/A;   repair broken C6 & C7     Family History Family History  Problem Relation Age of Onset   Cancer Mother        oral   Stroke Mother    Alzheimer's disease Mother        at 4   Heart attack Father 32   Coronary artery disease Brother    Heart attack Brother    Brain cancer Maternal Grandmother    Congestive Heart Failure Maternal Grandfather    Heart disease Maternal Grandfather    Heart attack Paternal Grandfather    Cancer Maternal Aunt        breast   Alcohol abuse Maternal Aunt    Alcohol abuse Maternal Uncle    Cancer Maternal Uncle        lung, stomach, oral   Cancer Paternal Uncle        lung   Cancer Paternal Uncle        bone   Lung cancer Other        uncle, non smoker   Leukemia Other        uncle   Alzheimer's disease Other        maternal great grandmother    Colon cancer Neg Hx    Breast cancer Neg Hx    Diabetes Neg Hx     Social History Social History   Tobacco Use   Smoking status: Never    Passive exposure: Never   Smokeless tobacco: Never  Vaping Use   Vaping status: Never Used   Substance Use Topics   Alcohol use: Never   Drug use: No   Allergies Ace inhibitors, Lisinopril, and Ciprofloxacin   Review of Systems Review of Systems  All other systems reviewed and are negative.   Physical Exam Vital Signs  I have reviewed the triage vital signs BP (!) 139/94   Pulse (!) 28   Temp 98.7 F (37.1 C)   Resp 11   Ht 5' 2 (1.575 m)   Wt 70 kg   SpO2 98%   BMI 28.23 kg/m  Physical Exam Vitals and nursing note reviewed.  Constitutional:      General: She is not in acute distress.    Appearance: She is well-developed.  HENT:     Head: Normocephalic and atraumatic.     Mouth/Throat:     Mouth: Mucous membranes are moist.   Eyes:     Pupils: Pupils are equal, round, and reactive to light.    Cardiovascular:     Rate and Rhythm: Normal rate and regular rhythm.     Heart sounds: No murmur heard. Pulmonary:     Effort: Pulmonary effort is normal. No respiratory distress.     Breath sounds: Normal breath sounds.  Abdominal:     General: Abdomen is flat.     Palpations: Abdomen is soft.     Tenderness: There is no abdominal tenderness. There is no right CVA tenderness or left CVA tenderness.   Musculoskeletal:  General: No tenderness.     Right lower leg: No edema.     Left lower leg: No edema.   Skin:    General: Skin is warm and dry.   Neurological:     General: No focal deficit present.     Mental Status: She is alert. Mental status is at baseline.     Cranial Nerves: No cranial nerve deficit.   Psychiatric:        Mood and Affect: Mood normal.        Behavior: Behavior normal.     ED Results and Treatments Labs (all labs ordered are listed, but only abnormal results are displayed) Labs Reviewed  COMPREHENSIVE METABOLIC PANEL WITH GFR - Abnormal; Notable for the following components:      Result Value   Glucose, Bld 101 (*)    Total Protein 6.3 (*)    AST 13 (*)    All other components within normal limits  URINALYSIS, W/  REFLEX TO CULTURE (INFECTION SUSPECTED) - Abnormal; Notable for the following components:   APPearance HAZY (*)    Specific Gravity, Urine 1.033 (*)    Protein, ur 30 (*)    Leukocytes,Ua MODERATE (*)    Bacteria, UA RARE (*)    All other components within normal limits  CBC WITH DIFFERENTIAL/PLATELET                                                                                                                          Radiology CT Angio Head W or Wo Contrast Result Date: 10/05/2023 CLINICAL DATA:  Cerebral aneurysm, untreated. EXAM: CT ANGIOGRAPHY HEAD TECHNIQUE: Multidetector CT imaging of the head was performed using the standard protocol during bolus administration of intravenous contrast. Multiplanar CT image reconstructions and MIPs were obtained to evaluate the vascular anatomy. RADIATION DOSE REDUCTION: This exam was performed according to the departmental dose-optimization program which includes automated exposure control, adjustment of the mA and/or kV according to patient size and/or use of iterative reconstruction technique. CONTRAST:  75mL OMNIPAQUE  IOHEXOL  350 MG/ML SOLN COMPARISON:  CTA head/neck September 25, 2023. FINDINGS: CT HEAD Brain: No evidence of acute infarction, hemorrhage, hydrocephalus, extra-axial collection or mass lesion/mass effect. Vascular: See below. Skull: No acute fracture. Sinuses: Clear sinuses. Other: No mastoid effusions. CTA HEAD Anterior circulation: Bilateral intracranial ICAs, MCAs, and ACAs are patent without proximal hemodynamically significant stenosis. Unchanged approximately 2 mm outpouching arising from the junction of the A1 and A2 segments, compatible with anterior communicating artery aneurysm. Posterior circulation: Bilateral intradural vertebral arteries, basilar artery and bilateral posterior cerebral arteries are patent without proximal hemodynamically significant stenosis. Right fetal type PCA, anatomic variant. Venous sinuses: As permitted by  contrast timing, patent. Review of the MIP images confirms the above findings. IMPRESSION: 1. No evidence of acute intracranial abnormality. 2. 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 3. No large vessel occlusion or proximal hemodynamically significant stenosis. Electronically Signed   By: Gilmore GORMAN Joshua CHRISTELLA.D.  On: 10/05/2023 21:17    Pertinent labs & imaging results that were available during my care of the patient were reviewed by me and considered in my medical decision making (see MDM for details).  Medications Ordered in ED Medications  cefTRIAXone  (ROCEPHIN ) 1 g in sodium chloride  0.9 % 100 mL IVPB (has no administration in time range)  acetaminophen  (TYLENOL ) tablet 1,000 mg (1,000 mg Oral Given 10/05/23 1514)  iohexol  (OMNIPAQUE ) 350 MG/ML injection 75 mL (75 mLs Intravenous Contrast Given 10/05/23 1859)                                                                                                                                     Procedures Procedures  (including critical care time)  Medical Decision Making / ED Course   MDM:  78 year old presenting to the emergency department UTI symptoms.  Will check labs, urinalysis.  No CVA tenderness, fevers to suggest pyelonephritis or systemic infection.  No flank pain to suggest nephrolithiasis.  No abdominal tenderness tenderness to suggest alternative process.  Patient also wondering about referral to neurosurgery.  Reviewed recent imaging which shows 2 mm ACA aneurysm.  She reports chronic unchanged headaches without any worsening to suggest change.  She denies any change in her headache since recent imaging 10 days ago.  She was referred to neurosurgery but did not want to follow-up with the group because she did not like one of the doctors.  Recommend that she follow-up with the group as is important to establish care, recommend she follow-up with Dr. Lanis since he typically manages this type of  problem in his group and he is on call today. Without change in symptoms don't think patient needs repeat imaging at this time       Clinical Course as of 10/05/23 2126  Sun Oct 05, 2023  1605 Pt reports developing a headache. Has improved but given known aneurism will obtain repeat CTA head [WS]  2125 CT head shows no change in aneurysm.  Patient stable for discharge to home with outpatient follow-up. Give dose of CTX in er and prescribe cefdinir . Recommended follow up with PMD and Dr. Lanis. Will discharge patient to home. All questions answered. Patient comfortable with plan of discharge. Return precautions discussed with patient and specified on the after visit summary.  [WS]    Clinical Course User Index [WS] Francesca Elsie CROME, MD     Additional history obtained:  -External records from outside source obtained and reviewed including: Chart review including previous notes, labs, imaging, consultation notes including prior ER notes and imaging    Lab Tests: -I ordered, reviewed, and interpreted labs.   The pertinent results include:   Labs Reviewed  COMPREHENSIVE METABOLIC PANEL WITH GFR - Abnormal; Notable for the following components:      Result Value   Glucose, Bld 101 (*)    Total Protein 6.3 (*)    AST 13 (*)  All other components within normal limits  URINALYSIS, W/ REFLEX TO CULTURE (INFECTION SUSPECTED) - Abnormal; Notable for the following components:   APPearance HAZY (*)    Specific Gravity, Urine 1.033 (*)    Protein, ur 30 (*)    Leukocytes,Ua MODERATE (*)    Bacteria, UA RARE (*)    All other components within normal limits  CBC WITH DIFFERENTIAL/PLATELET    Notable for signs of UTI    Imaging Studies ordered: I ordered imaging studies including CTA head a On my interpretation imaging demonstrates unchanged aneurism  I independently visualized and interpreted imaging. I agree with the radiologist interpretation   Medicines ordered and  prescription drug management: Meds ordered this encounter  Medications   acetaminophen  (TYLENOL ) tablet 1,000 mg   iohexol  (OMNIPAQUE ) 350 MG/ML injection 75 mL   cefTRIAXone  (ROCEPHIN ) 1 g in sodium chloride  0.9 % 100 mL IVPB    Antibiotic Indication::   UTI   cefdinir  (OMNICEF ) 300 MG capsule    Sig: Take 1 capsule (300 mg total) by mouth 2 (two) times daily for 7 days.    Dispense:  14 capsule    Refill:  0    -I have reviewed the patients home medicines and have made adjustments as needed   Reevaluation: After the interventions noted above, I reevaluated the patient and found that their symptoms have improved  Co morbidities that complicate the patient evaluation  Past Medical History:  Diagnosis Date   Anemia    Anxiety    Asthma    B12 deficiency    Bursitis    Chronic LBP    Chronic UTI    Diastolic dysfunction    Fibromyalgia    GERD (gastroesophageal reflux disease)    Hearing impaired person, bilateral    per pt   History of low potassium    Hyperlipemia    Hypertension    Hypothyroid    IBS (irritable bowel syndrome)    Inflammatory osteoarthritis    Kidney infection    Psoriatic arthritis (HCC)    S/P cardiac cath 11/30/2007   normal coronaries - Dr. Wonda (Dr. Mona reviewed films on 01/10/2016)      Dispostion: Disposition decision including need for hospitalization was considered, and patient discharged from emergency department.    Final Clinical Impression(s) / ED Diagnoses Final diagnoses:  Acute cystitis without hematuria  Brain aneurysm     This chart was dictated using voice recognition software.  Despite best efforts to proofread,  errors can occur which can change the documentation meaning.    Francesca Elsie CROME, MD 10/05/23 2126

## 2023-10-05 NOTE — ED Triage Notes (Signed)
 Patient to ED by POV with c/o dysuria. Per patient she was told by PCP whenever she has symptoms to come to ED. She voices  burning and pressure with urination.

## 2023-10-12 ENCOUNTER — Other Ambulatory Visit: Payer: Self-pay | Admitting: Neurology

## 2023-10-12 DIAGNOSIS — M797 Fibromyalgia: Secondary | ICD-10-CM

## 2023-10-12 DIAGNOSIS — G629 Polyneuropathy, unspecified: Secondary | ICD-10-CM

## 2023-10-12 DIAGNOSIS — F339 Major depressive disorder, recurrent, unspecified: Secondary | ICD-10-CM

## 2023-10-19 ENCOUNTER — Emergency Department (HOSPITAL_COMMUNITY)
Admission: EM | Admit: 2023-10-19 | Discharge: 2023-10-19 | Disposition: A | Discharge status: Home / Self Care | Attending: Emergency Medicine | Admitting: Emergency Medicine

## 2023-10-19 ENCOUNTER — Encounter (HOSPITAL_COMMUNITY): Payer: Self-pay | Admitting: Emergency Medicine

## 2023-10-19 ENCOUNTER — Emergency Department (HOSPITAL_COMMUNITY)

## 2023-10-19 DIAGNOSIS — K5900 Constipation, unspecified: Secondary | ICD-10-CM | POA: Diagnosis not present

## 2023-10-19 DIAGNOSIS — I2510 Atherosclerotic heart disease of native coronary artery without angina pectoris: Secondary | ICD-10-CM | POA: Diagnosis not present

## 2023-10-19 DIAGNOSIS — I10 Essential (primary) hypertension: Secondary | ICD-10-CM | POA: Insufficient documentation

## 2023-10-19 DIAGNOSIS — K5730 Diverticulosis of large intestine without perforation or abscess without bleeding: Secondary | ICD-10-CM | POA: Diagnosis not present

## 2023-10-19 DIAGNOSIS — R300 Dysuria: Secondary | ICD-10-CM | POA: Diagnosis present

## 2023-10-19 DIAGNOSIS — N3000 Acute cystitis without hematuria: Secondary | ICD-10-CM | POA: Diagnosis not present

## 2023-10-19 DIAGNOSIS — Z79899 Other long term (current) drug therapy: Secondary | ICD-10-CM | POA: Diagnosis not present

## 2023-10-19 DIAGNOSIS — K5712 Diverticulitis of small intestine without perforation or abscess without bleeding: Secondary | ICD-10-CM | POA: Diagnosis not present

## 2023-10-19 DIAGNOSIS — K449 Diaphragmatic hernia without obstruction or gangrene: Secondary | ICD-10-CM | POA: Diagnosis not present

## 2023-10-19 DIAGNOSIS — N3090 Cystitis, unspecified without hematuria: Secondary | ICD-10-CM | POA: Diagnosis not present

## 2023-10-19 LAB — CBC
HCT: 45.1 % (ref 36.0–46.0)
Hemoglobin: 14.5 g/dL (ref 12.0–15.0)
MCH: 32.1 pg (ref 26.0–34.0)
MCHC: 32.2 g/dL (ref 30.0–36.0)
MCV: 99.8 fL (ref 80.0–100.0)
Platelets: 297 K/uL (ref 150–400)
RBC: 4.52 MIL/uL (ref 3.87–5.11)
RDW: 12.8 % (ref 11.5–15.5)
WBC: 8.4 K/uL (ref 4.0–10.5)
nRBC: 0 % (ref 0.0–0.2)

## 2023-10-19 LAB — COMPREHENSIVE METABOLIC PANEL WITH GFR
ALT: 10 U/L (ref 0–44)
AST: 15 U/L (ref 15–41)
Albumin: 4.2 g/dL (ref 3.5–5.0)
Alkaline Phosphatase: 88 U/L (ref 38–126)
Anion gap: 9 (ref 5–15)
BUN: 14 mg/dL (ref 8–23)
CO2: 24 mmol/L (ref 22–32)
Calcium: 9.8 mg/dL (ref 8.9–10.3)
Chloride: 106 mmol/L (ref 98–111)
Creatinine, Ser: 0.93 mg/dL (ref 0.44–1.00)
GFR, Estimated: >60 mL/min (ref >60)
Glucose, Bld: 96 mg/dL (ref 70–99)
Potassium: 4.4 mmol/L (ref 3.5–5.1)
Sodium: 139 mmol/L (ref 135–145)
Total Bilirubin: 0.7 mg/dL (ref 0.0–1.2)
Total Protein: 7.6 g/dL (ref 6.5–8.1)

## 2023-10-19 LAB — URINALYSIS, ROUTINE W REFLEX MICROSCOPIC
Bilirubin Urine: NEGATIVE
Glucose, UA: NEGATIVE mg/dL
Hgb urine dipstick: NEGATIVE
Ketones, ur: NEGATIVE mg/dL
Nitrite: NEGATIVE
Protein, ur: NEGATIVE mg/dL
Specific Gravity, Urine: 1.027 (ref 1.005–1.030)
pH: 5 (ref 5.0–8.0)

## 2023-10-19 LAB — LIPASE, BLOOD: Lipase: 41 U/L (ref 11–51)

## 2023-10-19 MED ORDER — IOHEXOL 300 MG/ML  SOLN
100.0000 mL | Freq: Once | INTRAMUSCULAR | Status: AC | PRN
  Administered 2023-10-19: 100 mL via INTRAVENOUS

## 2023-10-19 MED ORDER — CEPHALEXIN 500 MG PO CAPS: 500.0000 mg | ORAL_CAPSULE | Freq: Four times a day (QID) | ORAL | 0 refills | Status: DC

## 2023-10-19 MED ORDER — MORPHINE SULFATE (PF) 4 MG/ML IV SOLN
4.0000 mg | Freq: Once | INTRAVENOUS | Status: DC
  Filled 2023-10-19: qty 1

## 2023-10-19 MED ORDER — DOCUSATE SODIUM 100 MG PO CAPS: 100.0000 mg | ORAL_CAPSULE | Freq: Once | ORAL | Status: DC

## 2023-10-19 MED ORDER — ONDANSETRON 4 MG PO TBDP: 4.0000 mg | ORAL_TABLET | Freq: Once | ORAL | Status: DC | PRN

## 2023-10-19 NOTE — ED Notes (Signed)
 Tech asked patient to get in bed. Patient refused bed stated chair works better for them.

## 2023-10-19 NOTE — Discharge Instructions (Addendum)
 Please use Tylenol  or ibuprofen  for pain.  You may use 600 mg ibuprofen  every 6 hours or 1000 mg of Tylenol  every 6 hours.  You may choose to alternate between the 2.  This would be most effective.  Not to exceed 4 g of Tylenol  within 24 hours.  Not to exceed 3200 mg ibuprofen  24 hours.  Please take the entire course of antibiotics that we have prescribed, follow-up with your primary care doctor or return to the emergency department if your symptoms worsen despite treatment.

## 2023-10-19 NOTE — ED Provider Notes (Signed)
 [place] EMERGENCY DEPARTMENT AT [facility] Provider Note   CSN: [id number] Arrival date & time: 10/19/23  1349     Patient presents with: Abdominal Pain and Dysuria   Kayla [name] is a 78 y.o. female with past medical history significant for hyperlipidemia, hypertension, CAD, gerd, IBS, fibromyalgia, adjustment disorder who presents with dysuria, lower abdominal pain, right flank pain. Endorses some constipation. No rectal bleeding, no obstipation.     Abdominal Pain Associated symptoms: dysuria   Dysuria Associated symptoms: abdominal pain        Prior to Admission medications   Medication Sig Start Date End Date Taking? Authorizing Provider  cephALEXin  (KEFLEX ) 500 MG capsule Take 1 capsule (500 mg total) by mouth 4 (four) times daily. 10/19/23  Yes Jade Burkard H, PA-C  acetaminophen  (TYLENOL ) 500 MG tablet Take 1,000 mg by mouth in the morning, at noon, in the evening, and at bedtime.    [provider]  amLODipine  (NORVASC ) 5 MG tablet Take 1 tablet (5 mg total) by mouth daily. 11/15/22   Lonni Slain, MD  Azelastine -Fluticasone  137-50 MCG/ACT SUSP Place 1 spray into the nose every 12 (twelve) hours. 07/10/23   Lowne Chase, Yvonne R, DO  bismuth subsalicylate (PEPTO BISMOL) 262 MG/15ML suspension Take 30 mLs by mouth every 6 (six) hours as needed for indigestion or diarrhea or loose stools.    [provider]  Cholecalciferol (VITAMIN D3) 1.25 MG (50000 UT) CAPS Take 1 capsule by mouth once a week. Sunday 07/22/22   [provider]  clotrimazole -betamethasone  (LOTRISONE ) cream Apply 1 Application topically daily. 08/14/23   Antonio Cyndee Rockers R, DO  [employer] VITAMIN B12 1000 MCG tablet Take 2,000 mcg by mouth daily.    [provider]  DULoxetine  (CYMBALTA ) 30 MG capsule Take 1 capsule (30 mg total) by mouth at bedtime. 09/17/23   Leigh Venetia CROME, MD  estradiol  (ESTRACE ) 0.1 MG/GM vaginal cream Apply a pea-sized amount of  cream to your vaginal opening using your index finger 3 nights a week 09/03/23   Matilda Senior, MD  hydrALAZINE  (APRESOLINE ) 10 MG tablet Take 1 tablet (10 mg total) by mouth 3 (three) times daily as needed (for elevated blood pressure (top number more than 140, bottom number more than 90)). 02/14/23   Lonni Slain, MD  hydrOXYzine  (ATARAX ) 25 MG tablet Take 1 tablet (25 mg total) by mouth every 8 (eight) hours as needed for itching. 04/27/23   Johnie Rumaldo LABOR, NP  levothyroxine  (SYNTHROID ) 175 MCG tablet Take 175 mcg by mouth daily before breakfast.    [provider]  methenamine  (HIPREX ) 1 g tablet Take 1 tablet (1 g total) by mouth 2 (two) times daily with a meal. 09/03/23   Matilda Senior, MD  nystatin  cream (MYCOSTATIN ) Apply to affected area 2 times daily 04/27/23   Johnie Rumaldo A, NP  ondansetron  (ZOFRAN -ODT) 4 MG disintegrating tablet Take 1 tablet (4 mg total) by mouth every 8 (eight) hours as needed. 08/03/23   Griselda Norris, MD  pantoprazole  (PROTONIX ) 40 MG tablet Take 1 tablet (40 mg total) by mouth daily. 07/10/23   Lowne Chase, Yvonne R, DO  potassium chloride  SA (KLOR-CON  M) 20 MEQ tablet Take 1 tablet (20 mEq total) by mouth daily. 08/18/23   Lowne Chase, Yvonne R, DO    Allergies: Ace inhibitors, Lisinopril, and Ciprofloxacin     Review of Systems  Gastrointestinal:  Positive for abdominal pain.  Genitourinary:  Positive for dysuria.  All other systems reviewed and  are negative.   Updated Vital Signs BP (!) 143/75 (BP Location: Left Arm)   Pulse 64   Temp 97.6 F (36.4 C) (Oral)   Resp 18   Ht 5' 2 (1.575 m)   Wt 72.6 kg   SpO2 100%   BMI 29.26 kg/m   Physical Exam Vitals and nursing note reviewed.  Constitutional:      General: She is not in acute distress.    Appearance: Normal appearance.  HENT:     Head: Normocephalic and atraumatic.  Eyes:     General:        Right eye: No discharge.        Left eye: No discharge.   Cardiovascular:     Rate and Rhythm: Normal rate and regular rhythm.     Heart sounds: No murmur heard.    No friction rub. No gallop.  Pulmonary:     Effort: Pulmonary effort is normal.     Breath sounds: Normal breath sounds.  Abdominal:     General: Bowel sounds are normal.     Palpations: Abdomen is soft.     Comments: Some diffuse tenderness throughout the abdomen, no rebound, rigidity, guarding.  Most focally tender in the suprapubic region.  Skin:    General: Skin is warm and dry.     Capillary Refill: Capillary refill takes less than 2 seconds.  Neurological:     Mental Status: She is alert and oriented to person, place, and time.  Psychiatric:        Mood and Affect: Mood normal.        Behavior: Behavior normal.     (all labs ordered are listed, but only abnormal results are displayed) Labs Reviewed  URINALYSIS, ROUTINE W REFLEX MICROSCOPIC - Abnormal; Notable for the following components:      Result Value   APPearance HAZY (*)    Leukocytes,Ua SMALL (*)    Bacteria, UA RARE (*)    All other components within normal limits  URINE CULTURE  LIPASE, BLOOD  COMPREHENSIVE METABOLIC PANEL WITH GFR  CBC    EKG: None  Radiology: CT ABDOMEN PELVIS W CONTRAST Result Date: 10/19/2023 CLINICAL DATA:  Acute nonlocalized abdominal pain. Lower abdominal pain for 2 days. Right flank pain. Burning with urination. EXAM: CT ABDOMEN AND PELVIS WITH CONTRAST TECHNIQUE: Multidetector CT imaging of the abdomen and pelvis was performed using the standard protocol following bolus administration of intravenous contrast. RADIATION DOSE REDUCTION: This exam was performed according to the departmental dose-optimization program which includes automated exposure control, adjustment of the mA and/or kV according to patient size and/or use of iterative reconstruction technique. CONTRAST:  OMNIPAQUE  IOHEXOL  300 MG/ML  SOLN COMPARISON:  Most recent CT 08/03/2023 FINDINGS: Lower chest: No  basilar airspace disease or pleural effusion. Small hiatal hernia. Hepatobiliary: No focal liver abnormality is seen. Status post cholecystectomy. No biliary dilatation. Pancreas: No ductal dilatation or inflammation. Spleen: Normal in size without focal abnormality. Adrenals/Urinary Tract: No adrenal nodule. No hydronephrosis. No renal calculi. Bilateral renal cortical scarring, without significant interval change. No convincing renal inflammation, no perinephric fat stranding. Mild perivesicular fat stranding. Stomach/Bowel: Small hiatal hernia. No small bowel obstruction or abnormal distention. Resolved small bowel diverticulitis from prior exam. There is no small bowel inflammation. High-riding cecum in the right mid abdomen. The appendix is not confidently visualized. Moderate volume of stool throughout the colon. Mild left colonic diverticulosis without colonic diverticulitis. Vascular/Lymphatic: Mild aortic atherosclerosis. No aneurysm. Patent portal, splenic, and mesenteric  veins. No abdominopelvic adenopathy. Reproductive: Simple appearing left adnexal cyst measuring 4.6 cm, stable over serial exams including pelvic ultrasound 01/13/2019. No specific imaging follow-up is needed. Endometrial thickness of 8 mm has been present dating back to prior pelvic ultrasound. Other: No free air or ascites. Small fat containing umbilical hernia. Musculoskeletal: Stable degenerative change in the spine. IMPRESSION: 1. Mild perivesicular fat stranding, can be seen with cystitis. Recommend correlation with urinalysis. 2. No renal calculi or hydronephrosis. Bilateral renal cortical scarring. 3. Resolved small bowel diverticulitis from prior exam. 4. Mild left colonic diverticulosis without colonic diverticulitis. 5. Small hiatal hernia. Aortic Atherosclerosis (ICD10-I70.0). Electronically Signed   By: Andrea Gasman M.D.   On: 10/19/2023 17:30     Procedures   Medications Ordered in the ED  ondansetron  (ZOFRAN -ODT)  disintegrating tablet 4 mg (has no administration in time range)  docusate sodium  (COLACE) capsule 100 mg (has no administration in time range)  morphine  (PF) 4 MG/ML injection 4 mg (4 mg Intravenous Not Given 10/19/23 1537)  iohexol  (OMNIPAQUE ) 300 MG/ML solution 100 mL (100 mLs Intravenous Contrast Given 10/19/23 1709)                                    Medical Decision Making Amount and/or Complexity of Data Reviewed Labs: ordered. Radiology: ordered.  Risk OTC drugs. Prescription drug management.   This patient is a 78 y.o. female  who presents to the ED for concern of abdominal pain.   Differential diagnoses prior to evaluation: The emergent differential diagnosis includes, but is not limited to,  The causes of generalized abdominal pain include but are not limited to AAA, mesenteric ischemia, appendicitis, diverticulitis, DKA, gastritis, gastroenteritis, AMI, nephrolithiasis, pancreatitis, peritonitis, adrenal insufficiency,lead poisoning, iron toxicity, intestinal ischemia, constipation, UTI,SBO/LBO, splenic rupture, biliary disease, IBD, IBS, PUD, or hepatitis. This is not an exhaustive differential.   Past Medical History / Co-morbidities / Social History:  hyperlipidemia, hypertension, CAD, gerd, IBS, fibromyalgia, adjustment disorder   Additional history: Chart reviewed. Pertinent results include: Reviewed lab work, imaging from previous emergency department visits  Physical Exam: Physical exam performed. The pertinent findings include: Some diffuse tenderness throughout the abdomen, no rebound, rigidity, guarding.  Most focally tender in the suprapubic region.   Lab Tests/Imaging studies: I personally interpreted labs/imaging and the pertinent results include: CBC unremarkable, CMP unremarkable, normal lipase, UA with small leukocytes, rare bacteria, given her bladder wall thickening on CT abdomen pelvis with contrast without any other findings I do think that this  consistent with an acute cystitis. I agree with the radiologist interpretation.   Medications: I did order some constipation medication, morphine , Zofran  with patient declined, will plan to discharge with antibiotics, encourage close PCP follow-up, extensive return precautions given.   Disposition: After consideration of the diagnostic results and the patients response to treatment, I feel that patient is stable for discharge with plan as above, treated for acute cystitis.   emergency department workup does not suggest an emergent condition requiring admission or immediate intervention beyond what has been performed at this time. The plan is: as above. The patient is safe for discharge and has been instructed to return immediately for worsening symptoms, change in symptoms or any other concerns.   Final diagnoses:  Acute cystitis without hematuria    ED Discharge Orders          Ordered    cephALEXin  (KEFLEX ) 500 MG capsule  4  times daily        10/19/23 1911               Kayla [name] 10/19/23 1934    Franklyn Sid SAILOR, MD 10/21/23 1753

## 2023-10-19 NOTE — ED Triage Notes (Signed)
 Pt comes in for lower abdominal pain. 2 days ago started having s/s of UTI. Patient complaints of right flank pain as well.  Burning with urination was really bad this morning.

## 2023-10-21 LAB — URINE CULTURE: Culture: <10000 — AB

## 2023-10-27 ENCOUNTER — Telehealth: Payer: Self-pay | Admitting: Family Medicine

## 2023-10-27 NOTE — Telephone Encounter (Signed)
 Copied from CRM [id number]. Topic: Medicare AWV >> Oct 27, 2023  9:57 AM Nathanel DEL wrote: Reason for CRM: Called LVM 10/27/2023 to schedule AWV. Please schedule Virtual or Telehealth visits ONLY.   Nathanel Paschal; Care Guide Ambulatory Clinical Support [place] [facility] Direct Dial: [phone]

## 2023-11-03 ENCOUNTER — Encounter (HOSPITAL_BASED_OUTPATIENT_CLINIC_OR_DEPARTMENT_OTHER): Payer: Self-pay

## 2023-11-04 ENCOUNTER — Encounter (HOSPITAL_BASED_OUTPATIENT_CLINIC_OR_DEPARTMENT_OTHER): Payer: Self-pay | Admitting: Family

## 2023-11-04 ENCOUNTER — Ambulatory Visit (HOSPITAL_BASED_OUTPATIENT_CLINIC_OR_DEPARTMENT_OTHER): Admitting: Family

## 2023-11-04 VITALS — BP 133/84 | HR 79 | Ht 64.0 in | Wt 160.1 lb

## 2023-11-04 DIAGNOSIS — R0602 Shortness of breath: Secondary | ICD-10-CM | POA: Diagnosis not present

## 2023-11-04 DIAGNOSIS — R600 Localized edema: Secondary | ICD-10-CM | POA: Diagnosis not present

## 2023-11-04 DIAGNOSIS — E785 Hyperlipidemia, unspecified: Secondary | ICD-10-CM | POA: Diagnosis not present

## 2023-11-04 DIAGNOSIS — I25118 Atherosclerotic heart disease of native coronary artery with other forms of angina pectoris: Secondary | ICD-10-CM

## 2023-11-04 DIAGNOSIS — I10 Essential (primary) hypertension: Secondary | ICD-10-CM

## 2023-11-04 NOTE — Patient Instructions (Addendum)
 Medication Instructions:  Recommend pick up Cymbalta  from the pharmacy as prescribed by neurology  *If you need a refill on your cardiac medications before your next appointment, please call your pharmacy*  Testing/Procedures: Your physician has requested that you have an echocardiogram. Echocardiography is a painless test that uses sound waves to create images of your heart. It provides your doctor with information about the size and shape of your heart and how well your heart's chambers and valves are working. This procedure takes approximately one hour. There are no restrictions for this procedure. Please do NOT wear cologne, perfume, aftershave, or lotions (deodorant is allowed). Please arrive 15 minutes prior to your appointment time.  Please note: We ask at that you not bring children with you during ultrasound (echo/ vascular) testing. Due to room size and safety concerns, children are not allowed in the ultrasound rooms during exams. Our front office staff cannot provide observation of children in our lobby area while testing is being conducted. An adult accompanying a patient to their appointment will only be allowed in the ultrasound room at the discretion of the ultrasound technician under special circumstances. We apologize for any inconvenience.   Follow-Up: At [facility], you and your health needs are our priority.  As part of our continuing mission to provide you with exceptional heart care, our providers are all part of one team.  This team includes your primary Cardiologist (physician) and Advanced Practice Providers or APPs (Physician Assistants and Nurse Practitioners) who all work together to provide you with the care you need, when you need it.  Your next appointment:   6 month(s)  Provider:   Shelda Bruckner, MD    We recommend signing up for the patient portal called MyChart.  Sign up information is provided on this After Visit Summary.  MyChart is used  to connect with patients for Virtual Visits (Telemedicine).  Patients are able to view lab/test results, encounter notes, upcoming appointments, etc.  Non-urgent messages can be sent to your provider as well.   To learn more about what you can do with MyChart, go to [email or web address].   Other Instructions     To prevent or reduce lower extremity swelling: Eat a low salt diet. Salt makes the body hold onto extra fluid which causes swelling. Sit with legs elevated. For example, in the recliner or on an ottoman.  Wear knee-high compression stockings during the daytime. Ones labeled 15-20 mmHg provide good compression.   To prevent palpitations: Make sure you are adequately hydrated.  Avoid and/or limit caffeine containing beverages like soda or tea. Exercise regularly.  Manage stress well. Some over the counter medications can cause palpitations such as Benadryl , AdvilPM, TylenolPM. Regular Advil  or Tylenol  do not cause palpitations.    Pursed Lip Breathing to help with shortness of breath Pursed lip breathing is a technique to relieve the feeling of being short of breath.  Being short of breath can make you tense and anxious. Before you start this breathing exercise, take a minute to relax your shoulders and close your eyes. Then: Start the exercise by closing your mouth. Breathe in through your nose, taking a normal breath. You can do this at your normal rate of breathing. If you feel you are not getting enough air, breathe in while slowly counting to 2 or 3. Pucker (purse) your lips as if you were going to whistle. Gently tighten the muscles of your abdomenor press on your abdomen to help push the air out. Breathe  out slowly through your pursed lips. Take at least twice as long to breathe out as it takes you to breathe in. Make sure that you breathe out all of the air, but do not force air out.
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
## 2023-11-05 ENCOUNTER — Encounter (HOSPITAL_BASED_OUTPATIENT_CLINIC_OR_DEPARTMENT_OTHER): Payer: Self-pay | Admitting: Family

## 2023-11-08 ENCOUNTER — Emergency Department (HOSPITAL_COMMUNITY)

## 2023-11-08 ENCOUNTER — Emergency Department (HOSPITAL_COMMUNITY)
Admission: EM | Admit: 2023-11-08 | Discharge: 2023-11-08 | Disposition: A | Discharge status: Home / Self Care | Attending: Emergency Medicine | Admitting: Emergency Medicine

## 2023-11-08 ENCOUNTER — Encounter (HOSPITAL_COMMUNITY): Payer: Self-pay | Admitting: Emergency Medicine

## 2023-11-08 ENCOUNTER — Other Ambulatory Visit: Payer: Self-pay

## 2023-11-08 DIAGNOSIS — R1084 Generalized abdominal pain: Secondary | ICD-10-CM | POA: Diagnosis not present

## 2023-11-08 DIAGNOSIS — B379 Candidiasis, unspecified: Secondary | ICD-10-CM | POA: Diagnosis not present

## 2023-11-08 DIAGNOSIS — K429 Umbilical hernia without obstruction or gangrene: Secondary | ICD-10-CM | POA: Diagnosis not present

## 2023-11-08 DIAGNOSIS — R21 Rash and other nonspecific skin eruption: Secondary | ICD-10-CM

## 2023-11-08 DIAGNOSIS — K5712 Diverticulitis of small intestine without perforation or abscess without bleeding: Secondary | ICD-10-CM | POA: Diagnosis not present

## 2023-11-08 DIAGNOSIS — R1030 Lower abdominal pain, unspecified: Secondary | ICD-10-CM | POA: Diagnosis present

## 2023-11-08 DIAGNOSIS — K5730 Diverticulosis of large intestine without perforation or abscess without bleeding: Secondary | ICD-10-CM | POA: Diagnosis not present

## 2023-11-08 DIAGNOSIS — R1032 Left lower quadrant pain: Secondary | ICD-10-CM | POA: Diagnosis not present

## 2023-11-08 LAB — COMPREHENSIVE METABOLIC PANEL WITH GFR
ALT: 9 U/L (ref 0–44)
AST: 14 U/L — ABNORMAL LOW (ref 15–41)
Albumin: 3.9 g/dL (ref 3.5–5.0)
Alkaline Phosphatase: 87 U/L (ref 38–126)
Anion gap: 7 (ref 5–15)
BUN: 11 mg/dL (ref 8–23)
CO2: 25 mmol/L (ref 22–32)
Calcium: 9.3 mg/dL (ref 8.9–10.3)
Chloride: 110 mmol/L (ref 98–111)
Creatinine, Ser: 0.99 mg/dL (ref 0.44–1.00)
GFR, Estimated: 58 mL/min — ABNORMAL LOW (ref >60)
Glucose, Bld: 116 mg/dL — ABNORMAL HIGH (ref 70–99)
Potassium: 3.7 mmol/L (ref 3.5–5.1)
Sodium: 142 mmol/L (ref 135–145)
Total Bilirubin: 0.7 mg/dL (ref 0.0–1.2)
Total Protein: 7.4 g/dL (ref 6.5–8.1)

## 2023-11-08 LAB — URINALYSIS, ROUTINE W REFLEX MICROSCOPIC
Bilirubin Urine: NEGATIVE
Glucose, UA: NEGATIVE mg/dL
Hgb urine dipstick: NEGATIVE
Ketones, ur: NEGATIVE mg/dL
Nitrite: NEGATIVE
Protein, ur: NEGATIVE mg/dL
Specific Gravity, Urine: 1.027 (ref 1.005–1.030)
pH: 5 (ref 5.0–8.0)

## 2023-11-08 LAB — CBC WITH DIFFERENTIAL/PLATELET
Abs Immature Granulocytes: 0.02 K/uL (ref 0.00–0.07)
Basophils Absolute: 0.1 K/uL (ref 0.0–0.1)
Basophils Relative: 1 %
Eosinophils Absolute: 0.1 K/uL (ref 0.0–0.5)
Eosinophils Relative: 2 %
HCT: 45 % (ref 36.0–46.0)
Hemoglobin: 14.3 g/dL (ref 12.0–15.0)
Immature Granulocytes: 0 %
Lymphocytes Relative: 29 %
Lymphs Abs: 2.5 K/uL (ref 0.7–4.0)
MCH: 31.6 pg (ref 26.0–34.0)
MCHC: 31.8 g/dL (ref 30.0–36.0)
MCV: 99.6 fL (ref 80.0–100.0)
Monocytes Absolute: 0.6 K/uL (ref 0.1–1.0)
Monocytes Relative: 7 %
Neutro Abs: 5.3 K/uL (ref 1.7–7.7)
Neutrophils Relative %: 61 %
Platelets: 291 K/uL (ref 150–400)
RBC: 4.52 MIL/uL (ref 3.87–5.11)
RDW: 12.7 % (ref 11.5–15.5)
WBC: 8.6 K/uL (ref 4.0–10.5)
nRBC: 0 % (ref 0.0–0.2)

## 2023-11-08 LAB — LIPASE, BLOOD: Lipase: 41 U/L (ref 11–51)

## 2023-11-08 LAB — I-STAT CG4 LACTIC ACID, ED: Lactic Acid, Venous: 0.9 mmol/L (ref 0.5–1.9)

## 2023-11-08 MED ORDER — FENTANYL CITRATE PF 50 MCG/ML IJ SOSY
50.0000 ug | PREFILLED_SYRINGE | Freq: Once | INTRAMUSCULAR | Status: AC
  Administered 2023-11-08: 50 ug via INTRAVENOUS
  Filled 2023-11-08: qty 1

## 2023-11-08 MED ORDER — IOHEXOL 300 MG/ML  SOLN
100.0000 mL | Freq: Once | INTRAMUSCULAR | Status: AC | PRN
  Administered 2023-11-08: 100 mL via INTRAVENOUS

## 2023-11-08 MED ORDER — SODIUM CHLORIDE 0.9 % IV BOLUS
500.0000 mL | Freq: Once | INTRAVENOUS | Status: AC
  Administered 2023-11-08: 500 mL via INTRAVENOUS

## 2023-11-08 MED ORDER — ONDANSETRON HCL 4 MG/2ML IJ SOLN
4.0000 mg | Freq: Once | INTRAMUSCULAR | Status: AC
Start: 2023-11-08 — End: 2023-11-08
  Administered 2023-11-08: 4 mg via INTRAVENOUS
  Filled 2023-11-08: qty 2

## 2023-11-08 MED ORDER — CLOTRIMAZOLE-BETAMETHASONE 1-0.05 % EX CREA: 1.0000 | TOPICAL_CREAM | Freq: Every day | CUTANEOUS | 0 refills | Status: AC

## 2023-11-08 NOTE — ED Provider Notes (Signed)
 [facility] AT [facility] Provider Note   CSN: [id number] Arrival date & time: 11/08/23  9041     Patient presents with: No chief complaint on file.   JUN RIGHTMYER is a 78 y.o. female.   78 year old female with a past medical history of diverticulitis presents to the ED with a chief complaint of lower abdominal pain which began last night.  Patient describes a lower abdominal cramping sharp stabbing pain to her lower abdomen.  No alleviating or exacerbating factors.  She states I am in pain all the time, but this pain feels like is likely worsened than prior.  She has not taken any medication for improvement in symptoms.  She is on a regiment of MiraLAX  per her gastroenterologist.  She reports normal bowel movements without any blood in her stool.  She has not had any nausea, no fever, no vomiting.  In addition, she tells me that she has been having some urinary symptoms with some pressure to her lower abdomen.  She has been taking prophylactic medication for UTI, but feels that this is likely not working.  No other complaints reported.  The history is provided by the patient.       Prior to Admission medications   Medication Sig Start Date End Date Taking? Authorizing Provider  acetaminophen  (TYLENOL ) 500 MG tablet Take 1,000 mg by mouth in the morning, at noon, in the evening, and at bedtime.   Yes [provider]  Cholecalciferol (VITAMIN D -3 PO) Take 1 capsule by mouth daily.   Yes [provider]  [employer] VITAMIN B12 1000 MCG tablet Take 2,000 mcg by mouth daily.   Yes [provider]  hydrOXYzine  (ATARAX ) 25 MG tablet Take 1 tablet (25 mg total) by mouth every 8 (eight) hours as needed for itching. 04/27/23  Yes Johnie, Carley A, NP  levothyroxine  (SYNTHROID ) 175 MCG tablet Take 175 mcg by mouth daily before breakfast.   Yes [provider]  MAGNESIUM  GLYCINATE PO Take 1 tablet by mouth daily.   Yes [provider]  methenamine  (HIPREX ) 1 g tablet Take 1 tablet (1 g total) by mouth 2 (two) times daily with a meal. 09/03/23  Yes Dahlstedt, Garnette, MD  nystatin  cream (MYCOSTATIN ) Apply to affected area 2 times daily Patient taking differently: Apply 1 Application topically 2 (two) times daily as needed for dry skin (or irritation- affected areas). 04/27/23  Yes Johnie Flaming A, NP  Potassium 99 MG TABS Take 99 mg by mouth daily.   Yes [provider]  amLODipine  (NORVASC ) 5 MG tablet TAKE 1 TABLET (5 MG TOTAL) BY MOUTH DAILY. 11/11/23   Lonni Slain, MD  Azelastine -Fluticasone  137-50 MCG/ACT SUSP Place 1 spray into the nose every 12 (twelve) hours. Patient not taking: Reported on 11/08/2023 07/10/23   Antonio Meth, Jamee SAUNDERS, DO  cephALEXin  (KEFLEX ) 500 MG capsule Take 1 capsule (500 mg total) by mouth 4 (four) times daily. Patient not taking: Reported on 11/08/2023 10/19/23   Prosperi, Christian H, PA-C  clotrimazole -betamethasone  (LOTRISONE ) cream Apply 1 Application topically daily for 10 days. 11/08/23 11/18/23  Mailee Klaas, PA-C  DULoxetine  (CYMBALTA ) 30 MG capsule Take 1 capsule (30 mg total) by mouth at bedtime. Patient not taking: Reported on 11/08/2023 09/17/23   Leigh Venetia CROME, MD  estradiol  (ESTRACE ) 0.1 MG/GM vaginal cream Apply a pea-sized amount of cream to your vaginal opening using your index finger 3 nights a week Patient not taking: Reported on 11/08/2023 09/03/23  Matilda Senior, MD  hydrALAZINE  (APRESOLINE ) 10 MG tablet Take 1 tablet (10 mg total) by mouth 3 (three) times daily as needed (for elevated blood pressure (top number more than 140, bottom number more than 90)). Patient not taking: Reported on 11/08/2023 02/14/23   Lonni Slain, MD  ondansetron  (ZOFRAN -ODT) 4 MG disintegrating tablet Take 1 tablet (4 mg total) by mouth every 8 (eight) hours as needed. Patient not taking: Reported on 11/08/2023 08/03/23   Griselda Norris, MD  pantoprazole  (PROTONIX ) 40 MG  tablet Take 1 tablet (40 mg total) by mouth daily. Patient not taking: Reported on 11/08/2023 07/10/23   Antonio Cyndee Rockers R, DO  potassium chloride  SA (KLOR-CON  M) 20 MEQ tablet Take 1 tablet (20 mEq total) by mouth daily. Patient not taking: Reported on 11/08/2023 08/18/23   Antonio Cyndee Rockers R, DO    Allergies: Ace inhibitors, Lisinopril, Ciprofloxacin , Fentanyl , and Iohexol     Review of Systems  Constitutional:  Negative for chills and fever.  Respiratory:  Negative for shortness of breath.   Cardiovascular:  Negative for chest pain.  Gastrointestinal:  Positive for abdominal pain. Negative for nausea and vomiting.  Genitourinary:  Positive for difficulty urinating.  All other systems reviewed and are negative.   Updated Vital Signs BP (!) 153/90 (BP Location: Left Arm)   Pulse 68   Temp (!) 97.5 F (36.4 C) (Oral)   Resp 18   Ht 5' 4 (1.626 m)   Wt 72.6 kg   SpO2 99%   BMI 27.46 kg/m   Physical Exam Vitals reviewed.  HENT:     Head: Normocephalic and atraumatic.     Mouth/Throat:     Mouth: Mucous membranes are moist.  Cardiovascular:     Rate and Rhythm: Normal rate.  Pulmonary:     Effort: Pulmonary effort is normal.     Breath sounds: No wheezing or rales.  Abdominal:     General: Abdomen is flat.     Palpations: Abdomen is soft.     Tenderness: There is abdominal tenderness. There is no right CVA tenderness or left CVA tenderness.  Musculoskeletal:     Cervical back: Normal range of motion and neck supple.  Neurological:     Mental Status: She is alert.     (all labs ordered are listed, but only abnormal results are displayed) Labs Reviewed  URINE CULTURE - Abnormal; Notable for the following components:      Result Value   Culture   (*)    Value: <10,000 COLONIES/mL INSIGNIFICANT GROWTH Performed at [facility], [address], [place], [place] [address]    All other components within normal limits  COMPREHENSIVE METABOLIC PANEL WITH GFR -  Abnormal; Notable for the following components:   Glucose, Bld 116 (*)    AST 14 (*)    GFR, Estimated 58 (*)    All other components within normal limits  URINALYSIS, ROUTINE W REFLEX MICROSCOPIC - Abnormal; Notable for the following components:   APPearance HAZY (*)    Leukocytes,Ua SMALL (*)    Bacteria, UA RARE (*)    Crystals PRESENT (*)    All other components within normal limits  CBC WITH DIFFERENTIAL/PLATELET  LIPASE, BLOOD  I-STAT CG4 LACTIC ACID, ED    EKG: None  Radiology: DG Chest 1 View Result Date: 11/11/2023 CLINICAL DATA:  Chest pain EXAM: CHEST  1 VIEW COMPARISON:  Aug 23, 2023 FINDINGS: The heart size and mediastinal contours are within normal limits. Both lungs  are clear. The visualized skeletal structures are unremarkable. IMPRESSION: No active disease. Electronically Signed   By: Nancyann Burns M.D.   On: 11/11/2023 14:43     Procedures   Medications Ordered in the ED  fentaNYL  (SUBLIMAZE ) injection 50 mcg (50 mcg Intravenous Given 11/08/23 1137)  sodium chloride  0.9 % bolus 500 mL (0 mLs Intravenous Stopped 11/08/23 1535)  ondansetron  (ZOFRAN ) injection 4 mg (4 mg Intravenous Given 11/08/23 1134)  iohexol  (OMNIPAQUE ) 300 MG/ML solution 100 mL (100 mLs Intravenous Contrast Given 11/08/23 1227)    Clinical Course as of 11/11/23 1635  Sat Nov 08, 2023  1706 Urine specimen to lab more than one hour ago per nursing. Still awaiting results.   [SU]    Clinical Course User Index [SU] Odell Balls, PA-C                                 Medical Decision Making Amount and/or Complexity of Data Reviewed Labs: ordered. Radiology: ordered.  Risk Prescription drug management.   This patient presents to the ED for concern of abdominal pain, this involves a number of treatment options, and is a complaint that carries with it a high risk of complications and morbidity.  The differential diagnosis includes    Co morbidities: Discussed in HPI   Brief  History:  See HPI  EMR reviewed including pt PMHx, past surgical history and past visits to ER.   See HPI for more details   Lab Tests:  I ordered and independently interpreted labs.  The pertinent results include:    I personally reviewed all laboratory work and imaging. Metabolic panel without any acute abnormality specifically kidney function within normal limits and no significant electrolyte abnormalities. CBC without leukocytosis or significant anemia.  Imaging Studies:  CT Abdomen and pelvis: IMPRESSION:  1. 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 Findings are favored to represent recurrent distal small bowel  diverticulitis, location suggests meckel's diverticulum.  2. Colonic diverticula without signs of acute colonic diverticulitis.  3. Left adnexal cyst measuring 4.4 cm, previously 4.6 cm.   Medicines ordered:  I ordered medication including fentanyl   for pain control Reevaluation of the patient after these medicines showed that the patient improved I have reviewed the patients home medicines and have made adjustments as needed  Consults:  N/A  Reevaluation:  After the interventions noted above I re-evaluated patient and found that they have :stayed the same   Social Determinants of Health:  The patient's social determinants of health were a factor in the care of this patient  Problem List / ED Course:  Patient presented to the ED with a chief complaint of abdominal pain which began yesterday, feels that her right upper abdomen is swollen, she has been dealing this in the past.  She does have a prior history of the same complaints.  She reports taking some over-the-counter medication without much improvement in symptoms.  On evaluation patient is hemodynamically stable, afebrile no nausea, no vomiting.  She tells me that she does have a history of  constipation and is currently on MiraLAX  twice daily but does not take it regularly.  She did have a normal bowel movement in the last 24 hours.  Abdomen is soft, not focally tender to palpation although she complains of more swelling to the right upper quadrant.  Blood  work here is unremarkable with a CBC with no leukocytosis, hemoglobin is stable.  CMP without any electrolyte derangement, creatinine level is normal.  LFTs are within normal limits, I do not suspect gallbladder etiology at this time as she is having regular oral intake at home.  Lipase level is normal.  Given fentanyl  for pain control, small amount of fluids to help with hydration.  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  No white count, no fever, no nausea or vomiting.  Does have a gastroenterologist she currently follows with.  Of note, she tells me that after she received a panel her tongue began to swell and she think she might be having an allergic reaction to this.  There is no rash present, oropharynx is clear, tells me that her tongue is swollen, does not appear swollen and unable to fully visualize it.  There is no wheezing.  I do not suspect allergic reaction at this time.  Patient is very tangential in the history, also tells me about her neuropathy which we discussed but I am unable to do anything at this time as she is currently followed by a neurologist.  I discussed with her we will obtain urinalysis to rule out infection and placed on antibiotics accordingly.  Urine was sent and currently waiting on results.  Dispostion:  Patient care signed out to incoming provider pending urinalysis as patient is currently symptomatic.      Final diagnoses:  Generalized abdominal pain  Candida infection    ED Discharge Orders          Ordered    clotrimazole -betamethasone  (LOTRISONE ) cream  Daily        11/08/23 1459               Dessirae Scarola, PA-C 11/11/23 1635     Patsey Lot, MD 11/17/23 1453

## 2023-11-08 NOTE — Discharge Instructions (Addendum)
 We discussed the results of your CT on today's visit, you were provided with a copy of your CT. Continue Tylenol  for pain and see your doctor for recheck early next week.   I prescribed the cream for your fungal rash under your breast, please use this cream as prescribed.  There is no infection when testing the urine. If you continue to have urinary symptoms, have your doctor recheck symptoms in one week.

## 2023-11-08 NOTE — ED Provider Notes (Signed)
  Physical Exam  BP (!) 153/90 (BP Location: Left Arm)   Pulse 68   Temp (!) 97.5 F (36.4 C) (Oral)   Resp 18   Ht 5' 4 (1.626 m)   Wt 72.6 kg   SpO2 99%   BMI 27.46 kg/m   Physical Exam  Procedures  Procedures  ED Course / MDM   Clinical Course as of 11/08/23 1706  Sat Nov 08, 2023  1706 Urine specimen to lab more than one hour ago per nursing. Still awaiting results.   [SU]    Clinical Course User Index [SU] Odell Balls, PA-C   Medical Decision Making Amount and/or Complexity of Data Reviewed Labs: ordered. Radiology: ordered.  Risk Prescription drug management.   AP x 1 day Fentanyl  - swollen tongue - no evidence allergic reaction on physical exam.  Fungal rash under breast - already addressed.   Pending UA  UA negative for infection. Ok to discharge per plan of previous treatment team.         Odell Balls, PA-C 11/08/23 1709    Patsey Lot, MD 11/09/23 [phone]

## 2023-11-08 NOTE — ED Notes (Signed)
 Pt made aware need of urine specimen, provided pt with cup and told her to push call button when ready.

## 2023-11-08 NOTE — ED Triage Notes (Signed)
 Patient complains of abdominal pain ongoing for weeks. States she is having issues with using the bathroom. Feels constipated. Rates pain 7/10. Denies nausea and vomiting. Has been having small BM daily.

## 2023-11-09 ENCOUNTER — Other Ambulatory Visit: Payer: Self-pay | Admitting: Cardiology

## 2023-11-09 LAB — URINE CULTURE: Culture: <10000 — AB

## 2023-11-11 ENCOUNTER — Emergency Department (HOSPITAL_COMMUNITY)
Admission: EM | Admit: 2023-11-11 | Discharge: 2023-11-12 | Disposition: A | Discharge status: Home / Self Care | Attending: Emergency Medicine | Admitting: Emergency Medicine

## 2023-11-11 ENCOUNTER — Emergency Department (EMERGENCY_DEPARTMENT_HOSPITAL)

## 2023-11-11 ENCOUNTER — Other Ambulatory Visit: Payer: Self-pay

## 2023-11-11 ENCOUNTER — Encounter (HOSPITAL_COMMUNITY): Payer: Self-pay

## 2023-11-11 DIAGNOSIS — Z79899 Other long term (current) drug therapy: Secondary | ICD-10-CM | POA: Diagnosis not present

## 2023-11-11 DIAGNOSIS — R079 Chest pain, unspecified: Secondary | ICD-10-CM

## 2023-11-11 DIAGNOSIS — R0982 Postnasal drip: Secondary | ICD-10-CM | POA: Insufficient documentation

## 2023-11-11 DIAGNOSIS — E039 Hypothyroidism, unspecified: Secondary | ICD-10-CM | POA: Diagnosis not present

## 2023-11-11 DIAGNOSIS — R109 Unspecified abdominal pain: Secondary | ICD-10-CM | POA: Diagnosis present

## 2023-11-11 DIAGNOSIS — I10 Essential (primary) hypertension: Secondary | ICD-10-CM | POA: Diagnosis not present

## 2023-11-11 DIAGNOSIS — K5792 Diverticulitis of intestine, part unspecified, without perforation or abscess without bleeding: Secondary | ICD-10-CM | POA: Diagnosis not present

## 2023-11-11 DIAGNOSIS — J45909 Unspecified asthma, uncomplicated: Secondary | ICD-10-CM | POA: Diagnosis not present

## 2023-11-11 DIAGNOSIS — B372 Candidiasis of skin and nail: Secondary | ICD-10-CM | POA: Insufficient documentation

## 2023-11-11 DIAGNOSIS — K5712 Diverticulitis of small intestine without perforation or abscess without bleeding: Secondary | ICD-10-CM | POA: Insufficient documentation

## 2023-11-11 LAB — URINALYSIS, ROUTINE W REFLEX MICROSCOPIC
Bilirubin Urine: NEGATIVE
Glucose, UA: NEGATIVE mg/dL
Ketones, ur: NEGATIVE mg/dL
Nitrite: NEGATIVE
Protein, ur: NEGATIVE mg/dL
Specific Gravity, Urine: 1.018 (ref 1.005–1.030)
pH: 5 (ref 5.0–8.0)

## 2023-11-11 LAB — LIPASE, BLOOD: Lipase: 35 U/L (ref 11–51)

## 2023-11-11 LAB — CBC WITH DIFFERENTIAL/PLATELET
Abs Immature Granulocytes: 0.01 K/uL (ref 0.00–0.07)
Basophils Absolute: 0.1 K/uL (ref 0.0–0.1)
Basophils Relative: 1 %
Eosinophils Absolute: 0.1 K/uL (ref 0.0–0.5)
Eosinophils Relative: 1 %
HCT: 45.6 % (ref 36.0–46.0)
Hemoglobin: 14.3 g/dL (ref 12.0–15.0)
Immature Granulocytes: 0 %
Lymphocytes Relative: 27 %
Lymphs Abs: 2.2 K/uL (ref 0.7–4.0)
MCH: 31.6 pg (ref 26.0–34.0)
MCHC: 31.4 g/dL (ref 30.0–36.0)
MCV: 100.9 fL — ABNORMAL HIGH (ref 80.0–100.0)
Monocytes Absolute: 0.5 K/uL (ref 0.1–1.0)
Monocytes Relative: 6 %
Neutro Abs: 5.4 K/uL (ref 1.7–7.7)
Neutrophils Relative %: 65 %
Platelets: 304 K/uL (ref 150–400)
RBC: 4.52 MIL/uL (ref 3.87–5.11)
RDW: 12.6 % (ref 11.5–15.5)
WBC: 8.3 K/uL (ref 4.0–10.5)
nRBC: 0 % (ref 0.0–0.2)

## 2023-11-11 LAB — COMPREHENSIVE METABOLIC PANEL WITH GFR
ALT: 8 U/L (ref 0–44)
AST: 15 U/L (ref 15–41)
Albumin: 3.9 g/dL (ref 3.5–5.0)
Alkaline Phosphatase: 83 U/L (ref 38–126)
Anion gap: 10 (ref 5–15)
BUN: 10 mg/dL (ref 8–23)
CO2: 23 mmol/L (ref 22–32)
Calcium: 9.2 mg/dL (ref 8.9–10.3)
Chloride: 109 mmol/L (ref 98–111)
Creatinine, Ser: 0.77 mg/dL (ref 0.44–1.00)
GFR, Estimated: >60 mL/min (ref >60)
Glucose, Bld: 107 mg/dL — ABNORMAL HIGH (ref 70–99)
Potassium: 3.8 mmol/L (ref 3.5–5.1)
Sodium: 142 mmol/L (ref 135–145)
Total Bilirubin: 0.8 mg/dL (ref 0.0–1.2)
Total Protein: 6.8 g/dL (ref 6.5–8.1)

## 2023-11-11 LAB — TROPONIN I (HIGH SENSITIVITY)
Troponin I (High Sensitivity): 7 ng/L (ref <18)
Troponin I (High Sensitivity): 6 ng/L (ref <18)

## 2023-11-11 NOTE — ED Provider Triage Note (Signed)
 Emergency Medicine Provider Triage Evaluation Note  Kayla [name] , a 78 y.o. female  was evaluated in triage.  Pt complains of abdominal pain, seen her at [facility] and dx with diverticulitis, has not picked up antibiotic. Also notes food bolus sensation in throat. She is speaking in full sentence, no acute distress, handling secretions and normal phonation. Notes some CP with this.   Review of Systems  Positive: Abdominal pain Negative: Fever  Physical Exam  BP (!) 144/82 (BP Location: Right Arm)   Pulse 62   Temp 98 F (36.7 C)   Resp 16   Ht 5' 4 (1.626 m)   Wt 70.3 kg   SpO2 100%   BMI 26.61 kg/m  Gen:   Awake, no distress   Resp:  Normal effort  MSK:   Moves extremities without difficulty  Other:  No oral swelling, no uvular swelling.   Medical Decision Making  Medically screening exam initiated at 1:32 PM.  Appropriate orders placed.  Beverley LILLETTE Reasoner was informed that the remainder of the evaluation will be completed by another provider, this initial triage assessment does not replace that evaluation, and the importance of remaining in the ED until their evaluation is complete.     Shermon Warren SAILOR, [place] 11/11/23 1333

## 2023-11-11 NOTE — ED Triage Notes (Signed)
 Pt c/o abdominal pain for past month, states it felt like diverticulitis, went to GI, took miralax , has not had a BM for past two weeks. Pt states she has been nauseated. Pt has seen urology, GI and neurology for same w/o relief. Pt states pain in neck and ears started this am. Closing feeling in throat started this am. Pt states she was seen in [facility] ED 3 days ago and told to come to [facility] for abdominal MRI.

## 2023-11-11 NOTE — ED Notes (Signed)
 Patient transported to X-ray

## 2023-11-11 NOTE — ED Triage Notes (Signed)
 PT arrives via POV. PT reports ongoing abdominal pain for the past few days. She also reports she feels like her throat is about to close up and states her neck is very tender. Reports difficulty swallowing. She is able to speak in full sentences. Airway is patent. NAD at this time.

## 2023-11-12 MED ORDER — AMOXICILLIN-POT CLAVULANATE 875-125 MG PO TABS
1.0000 | ORAL_TABLET | Freq: Once | ORAL | Status: AC
  Administered 2023-11-12: 1 via ORAL
  Filled 2023-11-12: qty 1

## 2023-11-12 MED ORDER — AMOXICILLIN-POT CLAVULANATE 875-125 MG PO TABS: 1.0000 | ORAL_TABLET | Freq: Two times a day (BID) | ORAL | 0 refills | Status: DC

## 2023-11-12 NOTE — ED Provider Notes (Signed)
  EMERGENCY DEPARTMENT AT [facility] Provider Note  CSN: [id number] Arrival date & time: 11/11/23 1305  Chief Complaint(s) Abdominal Pain, Sore Throat, and Trouble swallowing  HPI Kayla [name] is a 78 y.o. female here with persistent abdominal pain in the right abdomen.  Patient was seen at [facility] for the same and had a CT scan at that time notable for possible small bowel diverticulitis.  UA at that time was negative.  Also complaining of several days of nasal congestion, rhinorrhea and sore throat.  Also states that she has a rash underneath the bilateral breasts and upper abdomen.  States that she was prescribed antifungal cream but was unable to get it until today.  The history is provided by the patient.    Past Medical History Past Medical History:  Diagnosis Date   Anemia    Anxiety    Asthma    B12 deficiency    Bursitis    Chronic LBP    Chronic UTI    Diastolic dysfunction    Fibromyalgia    GERD (gastroesophageal reflux disease)    Hearing impaired person, bilateral    per pt   History of low potassium    Hyperlipemia    Hypertension    Hypothyroid    IBS (irritable bowel syndrome)    Inflammatory osteoarthritis    Kidney infection    Psoriatic arthritis (HCC)    S/P cardiac cath 11/30/2007   normal coronaries - Dr. Wonda (Dr. Mona reviewed films on 01/10/2016)   Patient Active Problem List   Diagnosis Date Noted   Rash 07/10/2023   Neuropathic pain of both legs 07/10/2023   Left ear pain 07/10/2023   Intractable chronic post-traumatic headache 07/10/2023   Hypomagnesemia 07/10/2023   Urine leukocytes increased 07/10/2023   Personal history of COVID-19 08/24/2020   Body mass index (BMI) 33.0-33.9, adult 06/20/2020   Status post lumbar microdiscectomy 06/20/2020   Essential (primary) hypertension 06/20/2020   Bilateral leg edema 06/17/2020   Cellulitis of leg, left 06/17/2020   Cellulitis of leg, right 06/17/2020   Lumbar  herniated disc 05/25/2020   Hyponatremia 05/12/2020   Sciatica 05/12/2020   COVID-19 05/09/2020   Hypokalemia 05/04/2020   Dysphagia 04/17/2020   Renal insufficiency 12/29/2018   Cyst of left ovary 12/29/2018   Esophageal dysmotility 12/29/2018   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/29/2018   Hypothyroidism 12/29/2018   Chronic maxillary sinusitis 07/07/2018   Snoring 06/01/2018   Costochondritis 06/01/2018   Family history of heart disease 06/01/2018   Class 2 severe obesity due to excess calories with serious comorbidity and body mass index (BMI) of 38.0 to 38.9 in adult (HCC) 06/01/2018   Multiple atypical skin moles 04/28/2017   Depression, recurrent (HCC) 07/16/2016   Adjustment disorder with mixed anxiety and depressed mood 01/15/2016   Fe Def Anemia 12/22/2015   Idiopathic scoliosis and kyphoscoliosis 12/20/2015   Osteopenia 12/20/2015   Fibromyalgia 08/02/2014   GAD (generalized anxiety disorder) 09/17/2012   Recurrent UTI 01/24/2012   Vitamin D  deficiency 01/24/2012   Fatigue 10/14/2011   Vitamin B12 deficiency    Hyperlipidemia 11/30/2007   Coronary artery disease involving native heart without angina pectoris 11/30/2007   Essential hypertension 01/27/2007   GERD 01/27/2007   IBS 01/27/2007   Home Medication(s) Prior to Admission medications   Medication Sig Start Date End Date Taking? Authorizing Provider  amoxicillin -clavulanate (AUGMENTIN ) 875-125 MG tablet Take 1 tablet by mouth every 12 (twelve) hours. 11/12/23  Yes  Trine Raynell Moder, MD  acetaminophen  (TYLENOL ) 500 MG tablet Take 1,000 mg by mouth in the morning, at noon, in the evening, and at bedtime.    [provider]  amLODipine  (NORVASC ) 5 MG tablet TAKE 1 TABLET (5 MG TOTAL) BY MOUTH DAILY. 11/11/23   Lonni Slain, MD  Azelastine -Fluticasone  137-50 MCG/ACT SUSP Place 1 spray into the nose every 12 (twelve) hours. Patient not taking: Reported on 11/08/2023 07/10/23   Lowne  Chase, Yvonne R, DO  Cholecalciferol (VITAMIN D -3 PO) Take 1 capsule by mouth daily.    [provider]  clotrimazole -betamethasone  (LOTRISONE ) cream Apply 1 Application topically daily for 10 days. 11/08/23 11/18/23  Soto, Johana, PA-C  [employer] VITAMIN B12 1000 MCG tablet Take 2,000 mcg by mouth daily.    [provider]  DULoxetine  (CYMBALTA ) 30 MG capsule Take 1 capsule (30 mg total) by mouth at bedtime. Patient not taking: Reported on 11/08/2023 09/17/23   Leigh Venetia CROME, MD  estradiol  (ESTRACE ) 0.1 MG/GM vaginal cream Apply a pea-sized amount of cream to your vaginal opening using your index finger 3 nights a week Patient not taking: Reported on 11/08/2023 09/03/23   Matilda Senior, MD  hydrALAZINE  (APRESOLINE ) 10 MG tablet Take 1 tablet (10 mg total) by mouth 3 (three) times daily as needed (for elevated blood pressure (top number more than 140, bottom number more than 90)). Patient not taking: Reported on 11/08/2023 02/14/23   Lonni Slain, MD  hydrOXYzine  (ATARAX ) 25 MG tablet Take 1 tablet (25 mg total) by mouth every 8 (eight) hours as needed for itching. 04/27/23   Johnie Flaming A, NP  levothyroxine  (SYNTHROID ) 175 MCG tablet Take 175 mcg by mouth daily before breakfast.    [provider]  MAGNESIUM  GLYCINATE PO Take 1 tablet by mouth daily.    [provider]  methenamine  (HIPREX ) 1 g tablet Take 1 tablet (1 g total) by mouth 2 (two) times daily with a meal. 09/03/23   Matilda Senior, MD  nystatin  cream (MYCOSTATIN ) Apply to affected area 2 times daily Patient taking differently: Apply 1 Application topically 2 (two) times daily as needed for dry skin (or irritation- affected areas). 04/27/23   Johnie Flaming A, NP  ondansetron  (ZOFRAN -ODT) 4 MG disintegrating tablet Take 1 tablet (4 mg total) by mouth every 8 (eight) hours as needed. Patient not taking: Reported on 11/08/2023 08/03/23   Griselda Norris, MD  pantoprazole  (PROTONIX ) 40 MG tablet Take  1 tablet (40 mg total) by mouth daily. Patient not taking: Reported on 11/08/2023 07/10/23   Antonio Meth, Yvonne R, DO  Potassium 99 MG TABS Take 99 mg by mouth daily.    [provider]  potassium chloride  SA (KLOR-CON  M) 20 MEQ tablet Take 1 tablet (20 mEq total) by mouth daily. Patient not taking: Reported on 11/08/2023 08/18/23   Antonio Meth Jamee JONELLE, DO  Allergies Ace inhibitors, Lisinopril, Ciprofloxacin , Fentanyl , and Iohexol   Review of Systems Review of Systems As noted in HPI  Physical Exam Vital Signs  I have reviewed the triage vital signs BP (!) 156/93 (BP Location: Left Arm)   Pulse 67   Temp 97.6 F (36.4 C) (Oral)   Resp 16   Ht 5' 4 (1.626 m)   Wt 70.3 kg   SpO2 100%   BMI 26.61 kg/m   Physical Exam Vitals reviewed.  Constitutional:      General: She is not in acute distress.    Appearance: She is well-developed. She is not diaphoretic.  HENT:     Head: Normocephalic and atraumatic.     Right Ear: External ear normal.     Left Ear: External ear normal.     Nose: Nose normal.     Mouth/Throat:     Pharynx: Postnasal drip present. No oropharyngeal exudate or posterior oropharyngeal erythema.     Tonsils: No tonsillar exudate.  Eyes:     General: No scleral icterus.    Conjunctiva/sclera: Conjunctivae normal.  Neck:     Trachea: Phonation normal.  Cardiovascular:     Rate and Rhythm: Normal rate and regular rhythm.  Pulmonary:     Effort: Pulmonary effort is normal. No respiratory distress.     Breath sounds: No stridor.  Abdominal:     General: There is no distension.  Musculoskeletal:        General: Normal range of motion.     Cervical back: Normal range of motion.  Skin:    Findings: Erythema (to bilateral upper abd and inframamillary folds) present.  Neurological:     Mental Status: She is alert and oriented  to person, place, and time.  Psychiatric:        Behavior: Behavior normal.     ED Results and Treatments Labs (all labs ordered are listed, but only abnormal results are displayed) Labs Reviewed  COMPREHENSIVE METABOLIC PANEL WITH GFR - Abnormal; Notable for the following components:      Result Value   Glucose, Bld 107 (*)    All other components within normal limits  CBC WITH DIFFERENTIAL/PLATELET - Abnormal; Notable for the following components:   MCV 100.9 (*)    All other components within normal limits  URINALYSIS, ROUTINE W REFLEX MICROSCOPIC - Abnormal; Notable for the following components:   APPearance HAZY (*)    Hgb urine dipstick SMALL (*)    Leukocytes,Ua MODERATE (*)    Bacteria, UA RARE (*)    All other components within normal limits  LIPASE, BLOOD  TROPONIN I (HIGH SENSITIVITY)  TROPONIN I (HIGH SENSITIVITY)                                                                                                                         EKG  EKG Interpretation Date/Time:    Ventricular Rate:    PR Interval:    QRS Duration:    QT Interval:  QTC Calculation:   R Axis:      Text Interpretation:         Radiology DG Chest 1 View Result Date: 11/11/2023 CLINICAL DATA:  Chest pain EXAM: CHEST  1 VIEW COMPARISON:  Aug 23, 2023 FINDINGS: The heart size and mediastinal contours are within normal limits. Both lungs are clear. The visualized skeletal structures are unremarkable. IMPRESSION: No active disease. Electronically Signed   By: Nancyann Burns M.D.   On: 11/11/2023 14:43    Medications Ordered in ED Medications  amoxicillin -clavulanate (AUGMENTIN ) 875-125 MG per tablet 1 tablet (1 tablet Oral Given 11/12/23 0018)   Procedures Procedures  (including critical care time) Medical Decision Making / ED Course   Medical Decision Making Amount and/or Complexity of Data Reviewed External Data Reviewed: labs, radiology and notes. Labs: ordered. Decision-making  details documented in ED Course.  Risk Prescription drug management.     Clinical Course as of 11/12/23 0057  Wed Nov 12, 2023  0030 Review CT from [facility] and looks like she had small bowel diverticulitis. Will treat with augmentin . No need for repeat imaging as her labs are reassuring and she appears well. [PC]  0031 Upper abd/lower chest rash does appear to be candidal in nature. She just picked up the antifungal cream and will start course. No need to readdress at this time. [PC]  0032 Sore throat and PND related to either allergic rhinitis or viral process. Not concerning strep. No PTA.  [PC]    Clinical Course User Index [PC] Shaw Dobek, Raynell Moder, MD    Final Clinical Impression(s) / ED Diagnoses Final diagnoses:  Diverticulitis of small intestine without perforation or abscess without bleeding  Yeast dermatitis  PND (post-nasal drip)   The patient appears reasonably screened and/or stabilized for discharge and I doubt any other medical condition or other [facility] requiring further screening, evaluation, or treatment in the ED at this time. I have discussed the findings, Dx and Tx plan with the patient/family who expressed understanding and agree(s) with the plan. Discharge instructions discussed at length. The patient/family was given strict return precautions who verbalized understanding of the instructions. No further questions at time of discharge.  Disposition: Discharge  Condition: Good  ED Discharge Orders          Ordered    amoxicillin -clavulanate (AUGMENTIN ) 875-125 MG tablet  Every 12 hours        11/12/23 0053              Follow Up: Antonio Cyndee Jamee JONELLE, DO [address] [place] [address] [phone]  Call  to schedule an appointment for close follow up    This chart was dictated using voice recognition software.  Despite best efforts to proofread,  errors can occur which can change the documentation meaning.    Trine Raynell Moder, MD 11/12/23 [phone]

## 2023-11-15 ENCOUNTER — Encounter (HOSPITAL_COMMUNITY): Payer: Self-pay | Admitting: Emergency Medicine

## 2023-11-15 ENCOUNTER — Emergency Department (HOSPITAL_COMMUNITY)

## 2023-11-15 ENCOUNTER — Emergency Department (HOSPITAL_COMMUNITY)
Admission: EM | Admit: 2023-11-15 | Discharge: 2023-11-15 | Disposition: A | Discharge status: Home / Self Care | Attending: Emergency Medicine | Admitting: Emergency Medicine

## 2023-11-15 ENCOUNTER — Other Ambulatory Visit: Payer: Self-pay

## 2023-11-15 DIAGNOSIS — R079 Chest pain, unspecified: Secondary | ICD-10-CM | POA: Diagnosis not present

## 2023-11-15 DIAGNOSIS — Z79899 Other long term (current) drug therapy: Secondary | ICD-10-CM | POA: Diagnosis not present

## 2023-11-15 DIAGNOSIS — M79661 Pain in right lower leg: Secondary | ICD-10-CM | POA: Diagnosis not present

## 2023-11-15 DIAGNOSIS — Z981 Arthrodesis status: Secondary | ICD-10-CM | POA: Diagnosis not present

## 2023-11-15 DIAGNOSIS — I10 Essential (primary) hypertension: Secondary | ICD-10-CM | POA: Diagnosis not present

## 2023-11-15 DIAGNOSIS — R072 Precordial pain: Secondary | ICD-10-CM | POA: Insufficient documentation

## 2023-11-15 DIAGNOSIS — M79662 Pain in left lower leg: Secondary | ICD-10-CM | POA: Insufficient documentation

## 2023-11-15 DIAGNOSIS — M25512 Pain in left shoulder: Secondary | ICD-10-CM | POA: Diagnosis not present

## 2023-11-15 DIAGNOSIS — I4891 Unspecified atrial fibrillation: Secondary | ICD-10-CM | POA: Diagnosis not present

## 2023-11-15 LAB — CBC WITH DIFFERENTIAL/PLATELET
Abs Immature Granulocytes: 0.01 K/uL (ref 0.00–0.07)
Basophils Absolute: 0.1 K/uL (ref 0.0–0.1)
Basophils Relative: 1 %
Eosinophils Absolute: 0.1 K/uL (ref 0.0–0.5)
Eosinophils Relative: 2 %
HCT: 41.3 % (ref 36.0–46.0)
Hemoglobin: 13.3 g/dL (ref 12.0–15.0)
Immature Granulocytes: 0 %
Lymphocytes Relative: 32 %
Lymphs Abs: 2.1 K/uL (ref 0.7–4.0)
MCH: 31.6 pg (ref 26.0–34.0)
MCHC: 32.2 g/dL (ref 30.0–36.0)
MCV: 98.1 fL (ref 80.0–100.0)
Monocytes Absolute: 0.6 K/uL (ref 0.1–1.0)
Monocytes Relative: 9 %
Neutro Abs: 3.8 K/uL (ref 1.7–7.7)
Neutrophils Relative %: 56 %
Platelets: 277 K/uL (ref 150–400)
RBC: 4.21 MIL/uL (ref 3.87–5.11)
RDW: 12.7 % (ref 11.5–15.5)
WBC: 6.7 K/uL (ref 4.0–10.5)
nRBC: 0 % (ref 0.0–0.2)

## 2023-11-15 LAB — BASIC METABOLIC PANEL WITH GFR
Anion gap: 7 (ref 5–15)
BUN: 6 mg/dL — ABNORMAL LOW (ref 8–23)
CO2: 21 mmol/L — ABNORMAL LOW (ref 22–32)
Calcium: 8.3 mg/dL — ABNORMAL LOW (ref 8.9–10.3)
Chloride: 115 mmol/L — ABNORMAL HIGH (ref 98–111)
Creatinine, Ser: 0.73 mg/dL (ref 0.44–1.00)
GFR, Estimated: >60 mL/min
Glucose, Bld: 90 mg/dL (ref 70–99)
Potassium: 3.2 mmol/L — ABNORMAL LOW (ref 3.5–5.1)
Sodium: 143 mmol/L (ref 135–145)

## 2023-11-15 LAB — TROPONIN I (HIGH SENSITIVITY)
Troponin I (High Sensitivity): 5 ng/L
Troponin I (High Sensitivity): 4 ng/L (ref <18)

## 2023-11-15 NOTE — ED Provider Notes (Signed)
 [place] EMERGENCY DEPARTMENT AT [facility] Provider Note   CSN: [id number] Arrival date & time: 11/15/23  9148     Patient presents with: Chest Pain   Kayla [name] is a 78 y.o. female.   Patient with history of hyperlipidemia, hypertension, family history of coronary artery disease, recent diagnosis of small bowel diverticulitis currently taking Augmentin  --presents to the emergency department today for evaluation of chest pain that awoke her from sleep at around 7:30 AM.  Patient states that she developed pain in her abdomen that was sharp and then that moved up to her chest.  This then moved into her left shoulder and neck area.  No shortness of breath.  No nausea or vomiting.  No diaphoresis.  Symptoms started at rest.  She was concerned about her chest pain, especially given her family history, prompting emergency department visit.  Also reports history of venous stasis changes in lower extremities which causes pain.  She had a cardiology visit on July 29th.  Plan at that time was for vein and vascular referral for venous stasis, including low-sodium diet and compression socks, echocardiogram was scheduled for September; hypertension continue amlodipine  and hydralazine ; hypercholesterolemia, continue exercise.        Prior to Admission medications   Medication Sig Start Date End Date Taking? Authorizing Provider  acetaminophen  (TYLENOL ) 500 MG tablet Take 1,000 mg by mouth in the morning, at noon, in the evening, and at bedtime.    [provider]  amLODipine  (NORVASC ) 5 MG tablet TAKE 1 TABLET (5 MG TOTAL) BY MOUTH DAILY. 11/11/23   Lonni Slain, MD  amoxicillin -clavulanate (AUGMENTIN ) 875-125 MG tablet Take 1 tablet by mouth every 12 (twelve) hours. 11/12/23   Trine Raynell Moder, MD  Azelastine -Fluticasone  137-50 MCG/ACT SUSP Place 1 spray into the nose every 12 (twelve) hours. Patient not taking: Reported on 11/08/2023 07/10/23   Lowne Chase, Yvonne R, DO   Cholecalciferol (VITAMIN D -3 PO) Take 1 capsule by mouth daily.    [provider]  clotrimazole -betamethasone  (LOTRISONE ) cream Apply 1 Application topically daily for 10 days. 11/08/23 11/18/23  Soto, Johana, PA-C  [employer] VITAMIN B12 1000 MCG tablet Take 2,000 mcg by mouth daily.    [provider]  DULoxetine  (CYMBALTA ) 30 MG capsule Take 1 capsule (30 mg total) by mouth at bedtime. Patient not taking: Reported on 11/08/2023 09/17/23   Leigh Venetia CROME, MD  estradiol  (ESTRACE ) 0.1 MG/GM vaginal cream Apply a pea-sized amount of cream to your vaginal opening using your index finger 3 nights a week Patient not taking: Reported on 11/08/2023 09/03/23   Matilda Senior, MD  hydrALAZINE  (APRESOLINE ) 10 MG tablet Take 1 tablet (10 mg total) by mouth 3 (three) times daily as needed (for elevated blood pressure (top number more than 140, bottom number more than 90)). Patient not taking: Reported on 11/08/2023 02/14/23   Lonni Slain, MD  hydrOXYzine  (ATARAX ) 25 MG tablet Take 1 tablet (25 mg total) by mouth every 8 (eight) hours as needed for itching. 04/27/23   Johnie Flaming A, NP  levothyroxine  (SYNTHROID ) 175 MCG tablet Take 175 mcg by mouth daily before breakfast.    [provider]  MAGNESIUM  GLYCINATE PO Take 1 tablet by mouth daily.    [provider]  methenamine  (HIPREX ) 1 g tablet Take 1 tablet (1 g total) by mouth 2 (two) times daily with a meal. 09/03/23   Matilda Senior, MD  nystatin  cream (MYCOSTATIN ) Apply to affected area 2 times daily Patient taking  differently: Apply 1 Application topically 2 (two) times daily as needed for dry skin (or irritation- affected areas). 04/27/23   Johnie Flaming A, NP  ondansetron  (ZOFRAN -ODT) 4 MG disintegrating tablet Take 1 tablet (4 mg total) by mouth every 8 (eight) hours as needed. Patient not taking: Reported on 11/08/2023 08/03/23   Griselda Norris, MD  pantoprazole  (PROTONIX ) 40 MG tablet Take 1 tablet (40 mg  total) by mouth daily. Patient not taking: Reported on 11/08/2023 07/10/23   Antonio Meth, Yvonne R, DO  Potassium 99 MG TABS Take 99 mg by mouth daily.    [provider]  potassium chloride  SA (KLOR-CON  M) 20 MEQ tablet Take 1 tablet (20 mEq total) by mouth daily. Patient not taking: Reported on 11/08/2023 08/18/23   Antonio Meth Jamee JONELLE, DO    Allergies: Ace inhibitors, Lisinopril, Ciprofloxacin , Fentanyl , and Iohexol     Review of Systems  Updated Vital Signs BP (!) 142/126 (BP Location: Right Arm)   Pulse (!) 57   Temp (!) 97.5 F (36.4 C) (Oral)   Resp 14   SpO2 100%   Physical Exam Vitals and nursing note reviewed.  Constitutional:      Appearance: She is well-developed. She is not diaphoretic.  HENT:     Head: Normocephalic and atraumatic.     Mouth/Throat:     Mouth: Mucous membranes are not dry.  Eyes:     Conjunctiva/sclera: Conjunctivae normal.  Neck:     Vascular: Normal carotid pulses. No JVD.     Trachea: Trachea normal. No tracheal deviation.  Cardiovascular:     Rate and Rhythm: Normal rate and regular rhythm.     Pulses: No decreased pulses.          Radial pulses are 2+ on the right side and 2+ on the left side.     Heart sounds: Normal heart sounds, S1 normal and S2 normal. No murmur heard. Pulmonary:     Effort: Pulmonary effort is normal. No respiratory distress.     Breath sounds: No wheezing, rhonchi or rales.  Chest:     Chest wall: No tenderness.  Abdominal:     General: Bowel sounds are normal.     Palpations: Abdomen is soft.     Tenderness: There is no abdominal tenderness. There is no guarding or rebound.  Musculoskeletal:        General: Normal range of motion.     Cervical back: Normal range of motion and neck supple. No muscular tenderness.     Right lower leg: Tenderness present.     Left lower leg: Tenderness present.     Comments: Mild venous stasis changes, minimal edema bilaterally and symmetric.  Skin:    General: Skin is  warm and dry.     Coloration: Skin is not pale.  Neurological:     Mental Status: She is alert.     (all labs ordered are listed, but only abnormal results are displayed) Labs Reviewed  BASIC METABOLIC PANEL WITH GFR - Abnormal; Notable for the following components:      Result Value   Potassium 3.2 (*)    Chloride 115 (*)    CO2 21 (*)    BUN 6 (*)    Calcium  8.3 (*)    All other components within normal limits  CBC WITH DIFFERENTIAL/PLATELET  TROPONIN I (HIGH SENSITIVITY)  TROPONIN I (HIGH SENSITIVITY)    ED ECG REPORT   Date: 11/15/2023  Rate: 59  Rhythm: sinus bradycardia  QRS  Axis: normal  Intervals: normal  ST/T Wave abnormalities: normal  Conduction Disutrbances:none  Narrative Interpretation:   Old EKG Reviewed: unchanged  I have personally reviewed the EKG tracing and agree with the computerized printout as noted.   Radiology: DG Chest 2 View Result Date: 11/15/2023 CLINICAL DATA:  Acute chest pain.  Atrial fibrillation. EXAM: CHEST - 2 VIEW COMPARISON:  11/11/2023 FINDINGS: The heart size and mediastinal contours are within normal limits. Both lungs are clear. Thoracic spine degenerative changes and cervical spine fusion hardware again noted. IMPRESSION: No active cardiopulmonary disease. Electronically Signed   By: Norleen DELENA Kil M.D.   On: 11/15/2023 10:55     Procedures   Medications Ordered in the ED - No data to display   ED Course  Patient seen and examined. History obtained directly from patient.  Reviewed previous ED visits as well as cardiology note from July.  Labs/EKG: Ordered CBC, BMP, troponin x 2.  EKG personally reviewed and interpreted as above.  Imaging: Ordered chest x-ray.  Medications/Fluids: None ordered  Most recent vital signs reviewed and are as follows: BP (!) 141/77   Pulse (!) 57   Temp (!) 97.5 F (36.4 C) (Oral)   Resp 12   SpO2 98%   Initial impression: Left-sided chest pain, current treatment for diverticulitis of  the small bowel.  Patient discussed earlier with Dr. Pamella who is seen patient.  1:38 PM Reassessment performed. Patient appears stable, comfortable.  Labs personally reviewed and interpreted including: CBC unremarkable; BMP potassium 3.2, normal creatinine, glucose normal; troponin 5>>4.   Imaging personally visualized and interpreted including: Chest x-ray, agree negative.  Reviewed pertinent lab work and imaging with patient at bedside. Questions answered.   Most current vital signs reviewed and are as follows: BP (!) 142/83   Pulse 69   Temp (!) 97.5 F (36.4 C) (Oral)   Resp 16   SpO2 98%   Plan: Discharge to home.   Prescriptions written for: None, offered trial of Lidoderm  patches, patient declined.  Return and follow-up instructions: I encouraged patient to return to ED with severe chest pain, especially if the pain is crushing or pressure-like and spreads to the arms, back, neck, or jaw, or if they have associated sweating, vomiting, or shortness of breath with the pain, or significant pain with activity. We discussed that the evaluation here today indicates a low-risk of serious cause of chest pain, including heart trouble or a blood clot, but no evaluation is perfect and chest pain can evolve with time. The patient verbalized understanding and agreed.  I encouraged patient to follow-up with their provider in the next 72 hours for recheck.                                    Medical Decision Making Amount and/or Complexity of Data Reviewed Labs: ordered. Radiology: ordered.   For this patient's complaint of chest pain, the following emergent conditions were considered on the differential diagnosis: acute coronary syndrome, pulmonary embolism, pneumothorax, myocarditis, pericardial tamponade, aortic dissection, thoracic aortic aneurysm complication, esophageal perforation.   Other causes were also considered including: gastroesophageal reflux disease, musculoskeletal pain  including costochondritis, pneumonia/pleurisy, herpes zoster, pericarditis.  In regards to possibility of ACS, patient has atypical features of pain, non-ischemic and unchanged EKG and negative troponin(s). Heart score was calculated to be moderate.   In regards to possibility of PE, symptoms are atypical for PE and  risk profile is low, making PE low likelihood.  No tachycardia, no hypoxia, no clinical signs or symptoms of DVT.  Symptoms possibly musculoskeletal in nature as they are somewhat reproducible.  Patient is also on Augmentin  antibiotic making GI etiology of symptoms more likely as well.  The patient's vital signs, pertinent lab work and imaging were reviewed and interpreted as discussed in the ED course. Hospitalization was considered for further testing, treatments, or serial exams/observation. However as patient is well-appearing, has a stable exam, and reassuring studies today, I do not feel that they warrant admission at this time. This plan was discussed with the patient who verbalizes agreement and comfort with this plan and seems reliable and able to return to the Emergency Department with worsening or changing symptoms.        Final diagnoses:  Precordial pain  Acute pain of left shoulder    ED Discharge Orders     None          Desiderio Chew, PA-C 11/15/23 1345    Pamella Ozell LABOR, DO 11/15/23 1657

## 2023-11-15 NOTE — ED Triage Notes (Addendum)
 Pt BIB [facility] from home due to chest pain.  Pt was woken out of sleep at 0730 this morning due to chest pain that radiated to left arm, neck and jaw.  Pt given 324 aspirin  en route.  18g left AC. VSS.

## 2023-11-15 NOTE — Discharge Instructions (Addendum)
 Please read and follow all provided instructions.  Your diagnoses today include:  1. Precordial pain   2. Acute pain of left shoulder    Tests performed today include: An EKG of your heart: No signs of atrial fibrillation today A chest x-ray: No acute findings Cardiac enzymes - a blood test for heart muscle damage, were both normal, no signs or suggestion of a heart attack Blood counts and electrolytes: Your potassium was a bit low at 3.2 Vital signs. See below for your results today.   Medications prescribed:  None  Take any prescribed medications only as directed.  Follow-up instructions: Please follow-up with your primary care provider as soon as you can for further evaluation of your symptoms.   Return instructions:  SEEK IMMEDIATE MEDICAL ATTENTION IF: You have severe chest pain, especially if the pain is crushing or pressure-like and spreads to the arms, back, neck, or jaw, or if you have sweating, nausea or vomiting, or trouble with breathing. THIS IS AN EMERGENCY. Do not wait to see if the pain will go away. Get medical help at once. Call 911. DO NOT drive yourself to the hospital.  Your chest pain gets worse and does not go away after a few minutes of rest.  You have an attack of chest pain lasting longer than what you usually experience.  You have significant dizziness, if you pass out, or have trouble walking.  You have chest pain not typical of your usual pain for which you originally saw your caregiver.  You have any other emergent concerns regarding your health.  Additional Information: Chest pain comes from many different causes. Your caregiver has diagnosed you as having chest pain that is not specific for one problem, but does not require admission.  You are at low risk for an acute heart condition or other serious illness.   Your vital signs today were: BP (!) 142/83   Pulse 69   Temp (!) 97.5 F (36.4 C) (Oral)   Resp 16   SpO2 98%  If your blood pressure  (BP) was elevated above 135/85 this visit, please have this repeated by your doctor within one month. --------------

## 2023-11-15 NOTE — ED Notes (Signed)
 Patient transported to X-ray

## 2023-12-03 ENCOUNTER — Ambulatory Visit: Admitting: Urology

## 2023-12-09 ENCOUNTER — Telehealth (HOSPITAL_COMMUNITY): Payer: Self-pay | Admitting: Family

## 2023-12-09 NOTE — Telephone Encounter (Signed)
 Patient called and cancelled scheduled echocardiogram due to death of her brother. She will call us  back to reschedule. Order will be removed from the active echo WQ and we will reinstate when patient calls back.

## 2023-12-09 NOTE — Telephone Encounter (Signed)
 Reviewed. Echo non urgent, reasonable to reschedule.   Lacosta Hargan S Derriana Oser, NP

## 2023-12-11 ENCOUNTER — Other Ambulatory Visit (HOSPITAL_COMMUNITY)

## 2023-12-29 ENCOUNTER — Other Ambulatory Visit: Payer: Self-pay

## 2023-12-29 DIAGNOSIS — I872 Venous insufficiency (chronic) (peripheral): Secondary | ICD-10-CM

## 2024-01-15 ENCOUNTER — Emergency Department (HOSPITAL_COMMUNITY)
Admission: EM | Admit: 2024-01-15 | Discharge: 2024-01-15 | Disposition: A | Discharge status: Home / Self Care | Attending: Emergency Medicine | Admitting: Emergency Medicine

## 2024-01-15 ENCOUNTER — Emergency Department (HOSPITAL_COMMUNITY)

## 2024-01-15 ENCOUNTER — Other Ambulatory Visit: Payer: Self-pay

## 2024-01-15 ENCOUNTER — Encounter (HOSPITAL_COMMUNITY): Payer: Self-pay

## 2024-01-15 DIAGNOSIS — E039 Hypothyroidism, unspecified: Secondary | ICD-10-CM | POA: Insufficient documentation

## 2024-01-15 DIAGNOSIS — I10 Essential (primary) hypertension: Secondary | ICD-10-CM | POA: Diagnosis not present

## 2024-01-15 DIAGNOSIS — Z79890 Hormone replacement therapy (postmenopausal): Secondary | ICD-10-CM | POA: Insufficient documentation

## 2024-01-15 DIAGNOSIS — R590 Localized enlarged lymph nodes: Secondary | ICD-10-CM | POA: Diagnosis not present

## 2024-01-15 DIAGNOSIS — J45909 Unspecified asthma, uncomplicated: Secondary | ICD-10-CM | POA: Diagnosis not present

## 2024-01-15 DIAGNOSIS — M79642 Pain in left hand: Secondary | ICD-10-CM | POA: Diagnosis not present

## 2024-01-15 DIAGNOSIS — M542 Cervicalgia: Secondary | ICD-10-CM | POA: Diagnosis not present

## 2024-01-15 DIAGNOSIS — Z79899 Other long term (current) drug therapy: Secondary | ICD-10-CM | POA: Insufficient documentation

## 2024-01-15 DIAGNOSIS — R519 Headache, unspecified: Secondary | ICD-10-CM | POA: Insufficient documentation

## 2024-01-15 DIAGNOSIS — B372 Candidiasis of skin and nail: Secondary | ICD-10-CM | POA: Insufficient documentation

## 2024-01-15 DIAGNOSIS — M25532 Pain in left wrist: Secondary | ICD-10-CM | POA: Insufficient documentation

## 2024-01-15 DIAGNOSIS — M19042 Primary osteoarthritis, left hand: Secondary | ICD-10-CM | POA: Diagnosis not present

## 2024-01-15 LAB — CBC WITH DIFFERENTIAL/PLATELET
Abs Immature Granulocytes: 0.02 K/uL (ref 0.00–0.07)
Basophils Absolute: 0.1 K/uL (ref 0.0–0.1)
Basophils Relative: 1 %
Eosinophils Absolute: 0.1 K/uL (ref 0.0–0.5)
Eosinophils Relative: 1 %
HCT: 45.6 % (ref 36.0–46.0)
Hemoglobin: 14.3 g/dL (ref 12.0–15.0)
Immature Granulocytes: 0 %
Lymphocytes Relative: 31 %
Lymphs Abs: 2.6 K/uL (ref 0.7–4.0)
MCH: 31.8 pg (ref 26.0–34.0)
MCHC: 31.4 g/dL (ref 30.0–36.0)
MCV: 101.6 fL — ABNORMAL HIGH (ref 80.0–100.0)
Monocytes Absolute: 0.6 K/uL (ref 0.1–1.0)
Monocytes Relative: 7 %
Neutro Abs: 4.9 K/uL (ref 1.7–7.7)
Neutrophils Relative %: 60 %
Platelets: 275 K/uL (ref 150–400)
RBC: 4.49 MIL/uL (ref 3.87–5.11)
RDW: 12.7 % (ref 11.5–15.5)
WBC: 8.3 K/uL (ref 4.0–10.5)
nRBC: 0 % (ref 0.0–0.2)

## 2024-01-15 LAB — URINALYSIS, ROUTINE W REFLEX MICROSCOPIC
Bilirubin Urine: NEGATIVE
Glucose, UA: NEGATIVE mg/dL
Hgb urine dipstick: NEGATIVE
Ketones, ur: NEGATIVE mg/dL
Nitrite: NEGATIVE
Protein, ur: NEGATIVE mg/dL
Specific Gravity, Urine: 1.005 (ref 1.005–1.030)
pH: 6 (ref 5.0–8.0)

## 2024-01-15 LAB — COMPREHENSIVE METABOLIC PANEL WITH GFR
ALT: 7 U/L (ref 0–44)
AST: 18 U/L (ref 15–41)
Albumin: 4.2 g/dL (ref 3.5–5.0)
Alkaline Phosphatase: 85 U/L (ref 38–126)
Anion gap: 9 (ref 5–15)
BUN: 7 mg/dL — ABNORMAL LOW (ref 8–23)
CO2: 25 mmol/L (ref 22–32)
Calcium: 9.6 mg/dL (ref 8.9–10.3)
Chloride: 109 mmol/L (ref 98–111)
Creatinine, Ser: 0.76 mg/dL (ref 0.44–1.00)
GFR, Estimated: >60 mL/min (ref >60)
Glucose, Bld: 87 mg/dL (ref 70–99)
Potassium: 3.8 mmol/L (ref 3.5–5.1)
Sodium: 144 mmol/L (ref 135–145)
Total Bilirubin: 0.5 mg/dL (ref 0.0–1.2)
Total Protein: 6.6 g/dL (ref 6.5–8.1)

## 2024-01-15 MED ORDER — DIPHENHYDRAMINE HCL 25 MG PO CAPS
50.0000 mg | ORAL_CAPSULE | Freq: Once | ORAL | Status: DC
  Filled 2024-01-15: qty 2

## 2024-01-15 MED ORDER — METOCLOPRAMIDE HCL 5 MG/ML IJ SOLN
10.0000 mg | Freq: Once | INTRAMUSCULAR | Status: AC
  Administered 2024-01-15: 10 mg via INTRAVENOUS
  Filled 2024-01-15: qty 2

## 2024-01-15 MED ORDER — DIPHENHYDRAMINE HCL 50 MG/ML IJ SOLN: 50.0000 mg | Freq: Once | INTRAMUSCULAR | Status: DC

## 2024-01-15 MED ORDER — IOHEXOL 350 MG/ML SOLN
75.0000 mL | Freq: Once | INTRAVENOUS | Status: AC | PRN
  Administered 2024-01-15: 75 mL via INTRAVENOUS

## 2024-01-15 MED ORDER — ACETAMINOPHEN 500 MG PO TABS
1000.0000 mg | ORAL_TABLET | Freq: Once | ORAL | Status: AC
  Administered 2024-01-15: 1000 mg via ORAL
  Filled 2024-01-15: qty 2

## 2024-01-15 MED ORDER — METHYLPREDNISOLONE SODIUM SUCC 125 MG IJ SOLR
INTRAMUSCULAR | Status: AC
  Filled 2024-01-15: qty 2

## 2024-01-15 MED ORDER — LACTATED RINGERS IV BOLUS
1000.0000 mL | Freq: Once | INTRAVENOUS | Status: AC
Start: 2024-01-15 — End: 2024-01-15
  Administered 2024-01-15: 1000 mL via INTRAVENOUS

## 2024-01-15 MED ORDER — METHYLPREDNISOLONE SODIUM SUCC 40 MG IJ SOLR
INTRAMUSCULAR | Status: AC
  Filled 2024-01-15: qty 1

## 2024-01-15 MED ORDER — METHYLPREDNISOLONE SODIUM SUCC 40 MG IJ SOLR
40.0000 mg | Freq: Once | INTRAMUSCULAR | Status: AC
  Administered 2024-01-15: 40 mg via INTRAVENOUS
  Filled 2024-01-15: qty 1

## 2024-01-15 MED ORDER — CLOTRIMAZOLE-BETAMETHASONE 1-0.05 % EX CREA: TOPICAL_CREAM | CUTANEOUS | 1 refills | Status: DC

## 2024-01-15 MED ORDER — METHOCARBAMOL 500 MG PO TABS: 500.0000 mg | ORAL_TABLET | Freq: Two times a day (BID) | ORAL | 0 refills | Status: DC

## 2024-01-15 NOTE — Discharge Instructions (Addendum)
 Thank you for coming to [facility] Emergency Department. You were seen for headache, neck pain, fall at home. We did an exam, labs, and imaging, and these showed no acute findings. We have refilled your clotrimazole  cream. Please follow up with your primary care provider within 1 week. You can take tylenol  1,000 mg every 8 hours at home for pain/discomfort.  Do not hesitate to return to the ED or call 911 if you experience: -Worsening symptoms -Stroke-like symptoms including asymmetric weakness, numbness/tingling, visual changes, slurred speech, facial droop, or confusion -Lightheadedness, passing out -Fevers/chills -Anything else that concerns you

## 2024-01-15 NOTE — ED Provider Notes (Signed)
 Kayla [name] Provider Note   CSN: [id number] Arrival date & time: 01/15/24  1432     History  Chief Complaint  Patient presents with   Neck Pain    Kayla [name] is a 78 y.o. female with PMH as listed below who presents with multiple complaints.  Patient reports increased neck stiffness and the left sided neck pain and headache over the last 3 to 4 days.  She reports a history of neck arthritis and fibromyalgia and so states that she hurts anyway but she says that she has a brain swelling sensation that is worse than normal.  She reports that the left side of her neck and head hurt and also feels as though her ear is hurting.  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  No acute visual changes or numbness/tingling, asymmetric weakness.  She did stumble and fall in the yard today but did not hit her head or lose consciousness and thinks she tripped in a hole while playing with her dog.  She was hurting before the fall.  She did have some increased pain in the left hand and wrist after the fall but is moving it without trouble.  She denies any chest pain and endorses chronic shortness of breath that is not worse than normal.  She also reports a sensation of dehydration and chapped lips and states that she has felt this way in the past when her potassium is low.  She does have a history of hypokalemia and takes potassium supplementation, has not had any changes in this dose or missed doses.  She denies nausea or vomiting.  Has chronic UTIs but no acute symptoms.  She also endorses a chronic rash in her right lower extremity that she is following up with at the end of the month that has been there since last summer.  Also requests a refill of her clotrimazole  cream for intertriginous candidiasis.  Patient states that her younger brother recently passed away very unexpectedly and she has had a lot of anxiety over her own health  since that time.  She was told at one point that she has a brain aneurysm and is very worried about it.   Past Medical History:  Diagnosis Date   Anemia    Anxiety    Asthma    B12 deficiency    Bursitis    Chronic LBP    Chronic UTI    Diastolic dysfunction    Fibromyalgia    GERD (gastroesophageal reflux disease)    Hearing impaired person, bilateral    per pt   History of low potassium    Hyperlipemia    Hypertension    Hypothyroid    IBS (irritable bowel syndrome)    Inflammatory osteoarthritis    Kidney infection    Psoriatic arthritis (HCC)    S/P cardiac cath 11/30/2007   normal coronaries - Dr. Wonda (Dr. Mona reviewed films on 01/10/2016)       Home Medications Prior to Admission medications   Medication Sig Start Date End Date Taking? Authorizing Provider  acetaminophen  (TYLENOL ) 500 MG tablet Take 1,000 mg by mouth in the morning, at noon, in the evening, and at bedtime.    [provider]  amLODipine  (NORVASC ) 5 MG tablet TAKE 1 TABLET (5 MG TOTAL) BY MOUTH DAILY. 11/11/23   Lonni Slain, MD  amoxicillin -clavulanate (AUGMENTIN ) 875-125 MG tablet Take 1 tablet by mouth every  12 (twelve) hours. 11/12/23   Trine Raynell Moder, MD  Azelastine -Fluticasone  137-50 MCG/ACT SUSP Place 1 spray into the nose every 12 (twelve) hours. Patient not taking: Reported on 11/08/2023 07/10/23   Lowne Chase, Yvonne R, DO  Cholecalciferol (VITAMIN D -3 PO) Take 1 capsule by mouth daily.    [provider]  [facility] VITAMIN B12 1000 MCG tablet Take 2,000 mcg by mouth daily.    [provider]  DULoxetine  (CYMBALTA ) 30 MG capsule Take 1 capsule (30 mg total) by mouth at bedtime. Patient not taking: Reported on 11/08/2023 09/17/23   Leigh Venetia CROME, MD  estradiol  (ESTRACE ) 0.1 MG/GM vaginal cream Apply a pea-sized amount of cream to your vaginal opening using your index finger 3 nights a week Patient not taking: Reported on 11/08/2023 09/03/23   Matilda Senior,  MD  hydrALAZINE  (APRESOLINE ) 10 MG tablet Take 1 tablet (10 mg total) by mouth 3 (three) times daily as needed (for elevated blood pressure (top number more than 140, bottom number more than 90)). Patient not taking: Reported on 11/08/2023 02/14/23   Lonni Slain, MD  hydrOXYzine  (ATARAX ) 25 MG tablet Take 1 tablet (25 mg total) by mouth every 8 (eight) hours as needed for itching. 04/27/23   Johnie Flaming A, NP  levothyroxine  (SYNTHROID ) 175 MCG tablet Take 175 mcg by mouth daily before breakfast.    [provider]  MAGNESIUM  GLYCINATE PO Take 1 tablet by mouth daily.    [provider]  methenamine  (HIPREX ) 1 g tablet Take 1 tablet (1 g total) by mouth 2 (two) times daily with a meal. 09/03/23   Matilda Senior, MD  nystatin  cream (MYCOSTATIN ) Apply to affected area 2 times daily Patient taking differently: Apply 1 Application topically 2 (two) times daily as needed for dry skin (or irritation- affected areas). 04/27/23   Johnie Flaming A, NP  ondansetron  (ZOFRAN -ODT) 4 MG disintegrating tablet Take 1 tablet (4 mg total) by mouth every 8 (eight) hours as needed. Patient not taking: Reported on 11/08/2023 08/03/23   Griselda Norris, MD  pantoprazole  (PROTONIX ) 40 MG tablet Take 1 tablet (40 mg total) by mouth daily. Patient not taking: Reported on 11/08/2023 07/10/23   Antonio Meth, Yvonne R, DO  Potassium 99 MG TABS Take 99 mg by mouth daily.    [provider]  potassium chloride  SA (KLOR-CON  M) 20 MEQ tablet Take 1 tablet (20 mEq total) by mouth daily. Patient not taking: Reported on 11/08/2023 08/18/23   Antonio Meth Jamee JONELLE, DO      Allergies    Ace inhibitors, Lisinopril, Ciprofloxacin , Fentanyl , and Iohexol     Review of Systems   Review of Systems A 10 point review of systems was performed and is negative unless otherwise reported in HPI.  Physical Exam Updated Vital Signs BP (!) 140/89   Pulse 80   Temp 98.3 F (36.8 C) (Oral)   Resp 16   Ht 5'  4 (1.626 m)   Wt 70.3 kg   SpO2 99%   BMI 26.60 kg/m  Physical Exam General: Normal appearing elderly female, lying in bed. Well-appearing, non-toxic.  HEENT: NCAT, EOMI, PERRLA, Sclera anicteric, MMM, trachea midline. TTP to L anterior neck, +anterior cervical lymphadenopathy. Clear oropharynx, no posterior oropharyngeal erythema. No tonsillar exudates.  Symmetric tonsillar pillars.  No submandibular tenderness to palpation.  Full range of motion of the neck.  No trismus. Cardiology: RRR, no murmurs/rubs/gallops. BL radial and DP pulses equal bilaterally.  Resp: Normal respiratory rate and effort. CTAB, no  wheezes, rhonchi, crackles.  Abd: Soft, non-tender, non-distended. No rebound tenderness or guarding.  GU: Deferred. MSK: No peripheral edema or signs of trauma. Extremities without deformity or TTP. No cyanosis or clubbing. Skin: warm, dry.  Very mild intertriginous candidiasis underneath pannus and breasts. Back: No CVA tenderness. No midline C-spine TTP deformities or stepoffs.  Neuro: A&Ox4, CNs II-XII grossly intact. 5/5 strength all extremities. Sensation grossly intact. Normal speech.  Psych: Normal mood and affect.   ED Results / Procedures / Treatments   Labs (all labs ordered are listed, but only abnormal results are displayed) Labs Reviewed  CBC WITH DIFFERENTIAL/PLATELET - Abnormal; Notable for the following components:      Result Value   MCV 101.6 (*)    All other components within normal limits  COMPREHENSIVE METABOLIC PANEL WITH GFR - Abnormal; Notable for the following components:   BUN 7 (*)    All other components within normal limits  URINALYSIS, ROUTINE W REFLEX MICROSCOPIC - Abnormal; Notable for the following components:   Color, Urine STRAW (*)    Leukocytes,Ua TRACE (*)    Bacteria, UA RARE (*)    All other components within normal limits    EKG EKG Interpretation Date/Time:  Thursday January 15 2024 17:36:34 EDT Ventricular Rate:  57 PR  Interval:  178 QRS Duration:  110 QT Interval:  432 QTC Calculation: 421 R Axis:   25  Text Interpretation: Sinus rhythm Atrial premature complexes RSR' in V1 or V2, probably normal variant Confirmed by Franklyn Gills [phone]) on 01/15/2024 8:27:21 PM  Radiology CTA H&N: 1. No large vessel occlusion or proximal hemodynamically significant stenosis. 2. 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  L wrist and hand XR: No acute displaced fracture or dislocation of the bones of the left wrist and hand.  Procedures Procedures    Medications Ordered in ED Medications  lactated ringers  bolus 1,000 mL (0 mLs Intravenous Stopped 01/15/24 2013)  acetaminophen  (TYLENOL ) tablet 1,000 mg (1,000 mg Oral Given 01/15/24 1825)  methylPREDNISolone  sodium succinate (SOLU-MEDROL ) 40 mg/mL injection 40 mg (40 mg Intravenous Given 01/15/24 1823)  metoCLOPramide  (REGLAN ) injection 10 mg (10 mg Intravenous Given 01/15/24 1823)  iohexol  (OMNIPAQUE ) 350 MG/ML injection 75 mL (75 mLs Intravenous Contrast Given 01/15/24 1927)    ED Course/ Medical Decision Making/ A&P                          Medical Decision Making Amount and/or Complexity of Data Reviewed Labs: ordered. Decision-making details documented in ED Course. Radiology: ordered. Decision-making details documented in ED Course.  Risk OTC drugs. Prescription drug management.    This patient presents to the ED for concern of headache and neck pain, wrist pain, this involves an extensive number of treatment options, and is a complaint that carries with it a high risk of complications and morbidity.  I considered the following differential and admission for this acute, potentially life threatening condition. Patient is overall very well-appearing, HDS, non-toxic, afebrile.   MDM:    For the patient's headache: Unlikely SAH: headache is non thunderclap Unlikely Subdural/epidural hematoma: no history of trauma, no  anticoagulation. Unlikely Meningitis: afebrile, no meningismus. Unlikely Temporal arteritis: no tenderness in temporal area, no unilateral visual changes Unlikely Acute angle glaucoma: PERRLA, no eye pain. Unlikely Carbon Monoxide Poisoning: no other house members with similar symptoms.  Most likely 2/2 tension headache, migraine, or headache of non-emergent etiology. No focal neurological symptoms. Neuro exam is  benign. Headache is gradual, non-maximal at onset and similar to headaches in the past. Based on the patient's history and physical there is very low clinical suspicion for significant intracranial pathology. The headache was NOT sudden onset, NOT maximal at onset, there are NO neurologic findings, the patient does NOT have a fever, the patient does NOT have any jaw claudication, the patient does NOT endorse a clotting disorder.  She did fall today but she did not hit her head or lose consciousness and I am not concerned about a traumatic brain hemorrhage.  Patient reports severe anxiety as she feels like her brain is swelling.  She does have a history of an aneurysm and her younger brother recently passed away which is making her feel very anxious about this and she would like imaging today. Will obtain vascular imaging to rule out any bleeding aneurysm or vascular abnormality, treat the patient symptomatically and reassess.  Patient reported some pain in her left ear but on exam this appears normal, no sign of acute otitis externa or acute otitis media, no perforated tympanic membrane or wax impaction. Patient reported pain in the left wrist after falling today.  Her exam is very reassuring, without any deformity and she has full range of motion.  Obtained an x-ray  Patient was also concerned that her potassium may be low.  Her potassium is within normal limits.  Her lips are cracked and she is likely dehydrated she was given a headache cocktail including LR, Tylenol , Solu-Medrol , Reglan .   She refused the Benadryl .  She is also requesting a refill of her clobetasol cream which is refilled for her.  On reevaluation patient is feeling improved.  Her CTA head was within normal limits except for an unchanged 2 mm left ACA aneurysm.  She is reassured about the negative imaging.  I suspect likely musculoskeletal pain is causing her neck pain and she requests a muscle relaxer so I will prescribe Robaxin  500 mg twice daily as needed.  Considered admission but patient is stable for discharge.  Patient be discharged home with instructions to take Tylenol  every 8 hours as needed.  Instructed to follow-up with her primary care physician within 1 week.   Clinical Course as of 01/25/24 0734  Thu Jan 15, 2024  1617 Potassium: 3.8 [HN]  1617 CBC, CMP unremarkable [HN]  2027 CT ANGIO HEAD NECK W WO CM 1. No large vessel occlusion or proximal hemodynamically significant stenosis. 2. 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   [HN]    Clinical Course User Index [HN] Franklyn Sid SAILOR, MD    Labs: I Ordered, and personally interpreted labs.  The pertinent results include:  those listed above  Imaging Studies ordered: I ordered imaging studies including CTH, CT soft tissue neck wo contrast (contrast allergy) I independently visualized and interpreted imaging. I agree with the radiologist interpretation  Additional history obtained from chart review.    Reevaluation: After the interventions noted above, I reevaluated the patient and found that they have :improved  Social Determinants of Health: Lives independently  Disposition:  DC w/ discharge instructions/return precautions. All questions answered to patient's satisfaction.    Co morbidities that complicate the patient evaluation  Past Medical History:  Diagnosis Date   Anemia    Anxiety    Asthma    B12 deficiency    Bursitis    Chronic LBP    Chronic UTI    Diastolic dysfunction    Fibromyalgia  GERD (gastroesophageal reflux disease)    Hearing impaired person, bilateral    per pt   History of low potassium    Hyperlipemia    Hypertension    Hypothyroid    IBS (irritable bowel syndrome)    Inflammatory osteoarthritis    Kidney infection    Psoriatic arthritis (HCC)    S/P cardiac cath 11/30/2007   normal coronaries - Dr. Wonda (Dr. Mona reviewed films on 01/10/2016)     Medicines No orders of the defined types were placed in this encounter.   I have reviewed the patients home medicines and have made adjustments as needed  Problem List / ED Course: Problem List Items Addressed This Visit   None Visit Diagnoses       Neck pain on left side    -  Primary     Bad headache       Relevant Medications   acetaminophen  (TYLENOL ) tablet 1,000 mg (Completed)   methocarbamol  (ROBAXIN ) 500 MG tablet                   This note was created using dictation software, which may contain spelling or grammatical errors.    Franklyn Sid SAILOR, MD 01/25/24 [phone]

## 2024-01-15 NOTE — ED Triage Notes (Addendum)
 Pt states she has a stiff neck for the last 3-4 days. States her neck always hurts due to her arthritis, but this is worse. Concerned her potassium is low, takes potassium supplements. Pt also states she fell in her backyard due to a hole in the yard, c/o left wrist/hand pain. No head injury

## 2024-01-30 ENCOUNTER — Telehealth: Payer: Self-pay

## 2024-01-30 NOTE — Telephone Encounter (Signed)
 Copied from CRM [id number]. Topic: Clinical - Request for Lab/Test Order >> Jan 30, 2024  1:44 PM Kayla [name] wrote: Reason for CRM: patient would like to have fasting complete panel drawn on the same day as her Physical. Patient requesting orders be placed.

## 2024-01-30 NOTE — Telephone Encounter (Signed)
 Pt made aware labs will be drawn at the time of the visit

## 2024-02-03 ENCOUNTER — Encounter: Payer: Self-pay | Admitting: Family Medicine

## 2024-02-03 ENCOUNTER — Ambulatory Visit: Admitting: Family Medicine

## 2024-02-03 VITALS — BP 118/88 | HR 78 | Temp 97.8°F | Resp 18 | Ht 64.0 in | Wt 164.8 lb

## 2024-02-03 DIAGNOSIS — R631 Polydipsia: Secondary | ICD-10-CM | POA: Insufficient documentation

## 2024-02-03 DIAGNOSIS — E782 Mixed hyperlipidemia: Secondary | ICD-10-CM | POA: Diagnosis not present

## 2024-02-03 DIAGNOSIS — G8929 Other chronic pain: Secondary | ICD-10-CM | POA: Diagnosis not present

## 2024-02-03 DIAGNOSIS — E039 Hypothyroidism, unspecified: Secondary | ICD-10-CM | POA: Diagnosis not present

## 2024-02-03 DIAGNOSIS — I729 Aneurysm of unspecified site: Secondary | ICD-10-CM | POA: Insufficient documentation

## 2024-02-03 DIAGNOSIS — I2510 Atherosclerotic heart disease of native coronary artery without angina pectoris: Secondary | ICD-10-CM | POA: Diagnosis not present

## 2024-02-03 DIAGNOSIS — R21 Rash and other nonspecific skin eruption: Secondary | ICD-10-CM | POA: Diagnosis not present

## 2024-02-03 DIAGNOSIS — G5793 Unspecified mononeuropathy of bilateral lower limbs: Secondary | ICD-10-CM

## 2024-02-03 DIAGNOSIS — I10 Essential (primary) hypertension: Secondary | ICD-10-CM

## 2024-02-03 DIAGNOSIS — R519 Headache, unspecified: Secondary | ICD-10-CM | POA: Diagnosis not present

## 2024-02-03 DIAGNOSIS — G629 Polyneuropathy, unspecified: Secondary | ICD-10-CM

## 2024-02-03 LAB — COMPREHENSIVE METABOLIC PANEL WITH GFR
ALT: 8 U/L (ref 0–35)
AST: 13 U/L (ref 0–37)
Albumin: 4.3 g/dL (ref 3.5–5.2)
Alkaline Phosphatase: 70 U/L (ref 39–117)
BUN: 13 mg/dL (ref 6–23)
CO2: 28 meq/L (ref 19–32)
Calcium: 9.6 mg/dL (ref 8.4–10.5)
Chloride: 107 meq/L (ref 96–112)
Creatinine, Ser: 0.78 mg/dL (ref 0.40–1.20)
GFR: 72.61 mL/min (ref >60.00)
Glucose, Bld: 93 mg/dL (ref 70–99)
Potassium: 4.1 meq/L (ref 3.5–5.1)
Sodium: 141 meq/L (ref 135–145)
Total Bilirubin: 0.7 mg/dL (ref 0.2–1.2)
Total Protein: 6.8 g/dL (ref 6.0–8.3)

## 2024-02-03 LAB — POC URINALSYSI DIPSTICK (AUTOMATED)
Blood, UA: NEGATIVE
Glucose, UA: NEGATIVE
Ketones, UA: NEGATIVE
Nitrite, UA: POSITIVE
Protein, UA: NEGATIVE
Spec Grav, UA: <=1.005 — AB (ref 1.010–1.025)
Urobilinogen, UA: 0.2 U/dL
pH, UA: 6 (ref 5.0–8.0)

## 2024-02-03 LAB — CBC WITH DIFFERENTIAL/PLATELET
Basophils Absolute: 0.1 K/uL (ref 0.0–0.1)
Basophils Relative: 0.9 % (ref 0.0–3.0)
Eosinophils Absolute: 0.1 K/uL (ref 0.0–0.7)
Eosinophils Relative: 1.7 % (ref 0.0–5.0)
HCT: 43.5 % (ref 36.0–46.0)
Hemoglobin: 14.5 g/dL (ref 12.0–15.0)
Lymphocytes Relative: 32.6 % (ref 12.0–46.0)
Lymphs Abs: 2 K/uL (ref 0.7–4.0)
MCHC: 33.3 g/dL (ref 30.0–36.0)
MCV: 98.5 fl (ref 78.0–100.0)
Monocytes Absolute: 0.6 K/uL (ref 0.1–1.0)
Monocytes Relative: 9.3 % (ref 3.0–12.0)
Neutro Abs: 3.4 K/uL (ref 1.4–7.7)
Neutrophils Relative %: 55.5 % (ref 43.0–77.0)
Platelets: 269 K/uL (ref 150.0–400.0)
RBC: 4.42 Mil/uL (ref 3.87–5.11)
RDW: 13.3 % (ref 11.5–15.5)
WBC: 6.2 K/uL (ref 4.0–10.5)

## 2024-02-03 LAB — MICROALBUMIN / CREATININE URINE RATIO
Creatinine,U: 115.4 mg/dL
Microalb Creat Ratio: UNDETERMINED mg/g (ref 0.0–30.0)
Microalb, Ur: 0.7 mg/dL (ref 0.0–1.9)

## 2024-02-03 LAB — LIPID PANEL
Cholesterol: 186 mg/dL (ref 0–200)
HDL: 60.6 mg/dL (ref >39.00)
LDL Cholesterol: 103 mg/dL — ABNORMAL HIGH (ref 0–99)
NonHDL: 125.57
Total CHOL/HDL Ratio: 3
Triglycerides: 112 mg/dL (ref 0.0–149.0)
VLDL: 22.4 mg/dL (ref 0.0–40.0)

## 2024-02-03 LAB — HEMOGLOBIN A1C: Hgb A1c MFr Bld: 5.2 % (ref 4.6–6.5)

## 2024-02-03 LAB — SEDIMENTATION RATE: Sed Rate: 5 mm/h (ref 0–30)

## 2024-02-03 LAB — TSH: TSH: 0.15 u[IU]/mL — ABNORMAL LOW (ref 0.35–5.50)

## 2024-02-03 MED ORDER — CLOTRIMAZOLE-BETAMETHASONE 1-0.05 % EX CREA: TOPICAL_CREAM | CUTANEOUS | 1 refills | Status: DC

## 2024-02-03 MED ORDER — METHOCARBAMOL 500 MG PO TABS: 500.0000 mg | ORAL_TABLET | Freq: Two times a day (BID) | ORAL | 0 refills | Status: AC

## 2024-02-03 NOTE — Assessment & Plan Note (Signed)
F/u neuro and vascular

## 2024-02-03 NOTE — Assessment & Plan Note (Signed)
 Encourage heart healthy diet such as MIND or DASH diet, increase exercise, avoid trans fats, simple carbohydrates and processed foods, consider a krill or fish or flaxseed oil cap daily.

## 2024-02-03 NOTE — Assessment & Plan Note (Signed)
 Check labs

## 2024-02-03 NOTE — Progress Notes (Unsigned)
 Subjective:    Patient ID: Kayla [name], female    DOB: 17-Jun-1945, 78 y.o.   MRN: [id number]  No chief complaint on file.   HPI Patient is in today for multiple complaints. Discussed the use of AI scribe software for clinical note transcription with the patient, who gave verbal consent to proceed.  History of Present Illness Kayla [name] is a 78 year old female who presents with concerns of possible diabetes and multiple symptoms.  She reports presyncope, a sensation of brain swelling, and neck stiffness with pain. She describes a burning sensation throughout her body, similar to a severe sunburn, with a recent exacerbation at 3:30 AM. She has not monitored her blood glucose levels and has been unable to consult an endocrinologist due to personal circumstances. She reports polydipsia, polyuria, dehydration, and frequent use of lip balm due to dry lips, describing herself as 'sub dehydrated'.  She has a history of a brain aneurysm and suffers from severe headaches, with pain so intense that she can barely touch her head. The pain is so intense that she can barely touch her head. She has not consulted a specialist for the aneurysm and notes worsening hearing, which she reports may be related to the aneurysm based on what she was told by other doctors.  She experiences neuropathy, particularly in her legs, describing them as being in a 'mess'. She reports burning sensations, hard and crumbly nails, and a history of cellulitis affecting the nerves in her feet. She wears special shoes due to nerve damage and cannot wear regular shoes. She has been prescribed Cymbalta  for nerve pain but has not started it due to conflicting advice from different doctors.  She reports significant vision changes, with her vision becoming blurry despite having new glasses. She has not returned to the eye doctor due to being out of town.  She has a history of kidney issues and was advised to go to the emergency room for  UTIs due to past kidney sepsis. She is currently on methenamine , which has prevented further UTIs.  She experiences constipation and feels cold all the time, needing to wrap up in blankets even when the temperature is warm. She lives with her son and granddaughter, who find the house too warm.  She has a history of falling, resulting in concussions, and reports ongoing stiffness and pain in her neck. She takes Tylenol  for pain relief but is cautious about overuse due to potential liver and kidney damage. She has lost a muscle relaxer prescribed by urgent care.  She has a history of diverticulitis and has seen a gastroenterologist but reports ongoing issues. She has not followed up with a dermatologist for her feet and legs due to losing the referral information.   Past Medical History:  Diagnosis Date   Anemia    Anxiety    Asthma    B12 deficiency    Bursitis    Chronic LBP    Chronic UTI    Diastolic dysfunction    Fibromyalgia    GERD (gastroesophageal reflux disease)    Hearing impaired person, bilateral    per pt   History of low potassium    Hyperlipemia    Hypertension    Hypothyroid    IBS (irritable bowel syndrome)    Inflammatory osteoarthritis    Kidney infection    Psoriatic arthritis (HCC)    S/P cardiac cath 11/30/2007   normal coronaries - Dr. Wonda (Dr. Mona reviewed films on 01/10/2016)  Past Surgical History:  Procedure Laterality Date   CESAREAN SECTION  06/10/1975   CHOLECYSTECTOMY     LUMBAR LAMINECTOMY/DECOMPRESSION MICRODISCECTOMY Right 05/25/2020   Procedure: MICRODISCECTOMY Right Lumbar three - four;  Surgeon: Mavis Purchase, MD;  Location[facility];  Service: Neurosurgery;  Laterality: Right;   NECK SURGERY  1998   RADIOLOGY WITH ANESTHESIA N/A 05/16/2020   Procedure: MRI LUMBAR SPINE W/CONTRAST;  Surgeon: Radiologist, Medication, MD;  Location: [facility] OR;  Service: Radiology;  Laterality: N/A;   repair broken C6 & C7      Family History  Problem  Relation Age of Onset   Cancer Mother        oral   Stroke Mother    Alzheimer's disease Mother        at 18   Heart attack Father 66   Coronary artery disease Brother    Heart attack Brother    Brain cancer Maternal Grandmother    Congestive Heart Failure Maternal Grandfather    Heart disease Maternal Grandfather    Heart attack Paternal Grandfather    Cancer Maternal Aunt        breast   Alcohol abuse Maternal Aunt    Alcohol abuse Maternal Uncle    Cancer Maternal Uncle        lung, stomach, oral   Cancer Paternal Uncle        lung   Cancer Paternal Uncle        bone   Lung cancer Other        uncle, non smoker   Leukemia Other        uncle   Alzheimer's disease Other        maternal great grandmother    Colon cancer Neg Hx    Breast cancer Neg Hx    Diabetes Neg Hx     Social History   Socioeconomic History   Marital status: Widowed    Spouse name: Not on file   Number of children: 2   Years of education: Not on file   Highest education level: Bachelor's degree (e.g., BA, AB, BS)  Occupational History   Not on file  Tobacco Use   Smoking status: Never    Passive exposure: Never   Smokeless tobacco: Never  Vaping Use   Vaping status: Never Used  Substance and Sexual Activity   Alcohol use: Never   Drug use: No   Sexual activity: Not Currently    Birth control/protection: Post-menopausal  Other Topics Concern   Not on file  Social History Narrative   Lost husband 2009, lost mother 2016      Lives at home (son & 3 grandchildren live with her)   Caffeine: 10-12 oz daily   Social Drivers of Corporate Investment Banker Strain: Not on file  Food Insecurity: Not on file  Transportation Needs: Not on file  Physical Activity: Not on file  Stress: Not on file  Social Connections: Not on file  Intimate Partner Violence: Not on file    Outpatient Medications Prior to Visit  Medication Sig Dispense Refill   acetaminophen  (TYLENOL ) 500 MG tablet Take  1,000 mg by mouth in the morning, at noon, in the evening, and at bedtime.     amLODipine  (NORVASC ) 5 MG tablet TAKE 1 TABLET (5 MG TOTAL) BY MOUTH DAILY. 90 tablet 3   amoxicillin -clavulanate (AUGMENTIN ) 875-125 MG tablet Take 1 tablet by mouth every 12 (twelve) hours. 14 tablet 0   Cholecalciferol (VITAMIN D -3 PO)  Take 1 capsule by mouth daily.     [facility] VITAMIN B12 1000 MCG tablet Take 2,000 mcg by mouth daily.     hydrOXYzine  (ATARAX ) 25 MG tablet Take 1 tablet (25 mg total) by mouth every 8 (eight) hours as needed for itching. 12 tablet 0   levothyroxine  (SYNTHROID ) 175 MCG tablet Take 175 mcg by mouth daily before breakfast.     MAGNESIUM  GLYCINATE PO Take 1 tablet by mouth daily.     methenamine  (HIPREX ) 1 g tablet Take 1 tablet (1 g total) by mouth 2 (two) times daily with a meal. 60 tablet 11   nystatin  cream (MYCOSTATIN ) Apply to affected area 2 times daily (Patient taking differently: Apply 1 Application topically 2 (two) times daily as needed for dry skin (or irritation- affected areas).) 30 g 0   Potassium 99 MG TABS Take 99 mg by mouth daily.     clotrimazole -betamethasone  (LOTRISONE ) cream Apply to affected area 2 times daily prn 15 g 1   methocarbamol  (ROBAXIN ) 500 MG tablet Take 1 tablet (500 mg total) by mouth 2 (two) times daily. 20 tablet 0   Azelastine -Fluticasone  137-50 MCG/ACT SUSP Place 1 spray into the nose every 12 (twelve) hours. (Patient not taking: Reported on 02/03/2024) 23 g 5   DULoxetine  (CYMBALTA ) 30 MG capsule Take 1 capsule (30 mg total) by mouth at bedtime. (Patient not taking: Reported on 02/03/2024) 30 capsule 5   estradiol  (ESTRACE ) 0.1 MG/GM vaginal cream Apply a pea-sized amount of cream to your vaginal opening using your index finger 3 nights a week (Patient not taking: Reported on 02/03/2024) 42.5 g 3   hydrALAZINE  (APRESOLINE ) 10 MG tablet Take 1 tablet (10 mg total) by mouth 3 (three) times daily as needed (for elevated blood pressure (top number more than  140, bottom number more than 90)). (Patient not taking: Reported on 02/03/2024) 90 tablet 3   ondansetron  (ZOFRAN -ODT) 4 MG disintegrating tablet Take 1 tablet (4 mg total) by mouth every 8 (eight) hours as needed. (Patient not taking: Reported on 02/03/2024) 12 tablet 0   pantoprazole  (PROTONIX ) 40 MG tablet Take 1 tablet (40 mg total) by mouth daily. (Patient not taking: Reported on 02/03/2024) 30 tablet 3   potassium chloride  SA (KLOR-CON  M) 20 MEQ tablet Take 1 tablet (20 mEq total) by mouth daily. (Patient not taking: Reported on 02/03/2024) 10 tablet 2   No facility-administered medications prior to visit.    Allergies  Allergen Reactions   Ace Inhibitors Anaphylaxis and Other (See Comments)    Glossal edema   Lisinopril Anaphylaxis   Ciprofloxacin  Other (See Comments)    Told by Rheumatologist not to take it d/t to RA   Fentanyl  Swelling and Other (See Comments)    Tongue swelling POSSIBLY   Iohexol  Swelling and Other (See Comments)    Tongue swelling POSSIBLY    Review of Systems  Constitutional:  Positive for malaise/fatigue. Negative for fever.  HENT:  Positive for hearing loss. Negative for congestion.   Eyes:  Positive for blurred vision.  Respiratory:  Negative for shortness of breath.   Cardiovascular:  Negative for chest pain, palpitations and leg swelling.  Gastrointestinal:  Positive for constipation. Negative for abdominal pain, blood in stool and nausea.  Genitourinary:  Positive for frequency. Negative for dysuria.  Musculoskeletal:  Positive for joint pain. Negative for falls.  Skin:  Positive for rash.  Neurological:  Positive for tingling and headaches. Negative for dizziness and loss of consciousness.  Endo/Heme/Allergies:  Negative for  environmental allergies.  Psychiatric/Behavioral:  Negative for depression. The patient is not nervous/anxious.        Objective:    Physical Exam Vitals and nursing note reviewed.  Constitutional:      General: She is  not in acute distress.    Appearance: Normal appearance. She is well-developed.  HENT:     Head: Normocephalic and atraumatic.  Eyes:     General: No scleral icterus.       Right eye: No discharge.        Left eye: No discharge.  Cardiovascular:     Rate and Rhythm: Normal rate and regular rhythm.     Heart sounds: Murmur heard.  Pulmonary:     Effort: Pulmonary effort is normal. No respiratory distress.     Breath sounds: Normal breath sounds.  Musculoskeletal:        General: Normal range of motion.     Cervical back: Normal range of motion and neck supple.     Right lower leg: No edema.     Left lower leg: No edema.  Skin:    General: Skin is warm and dry.  Neurological:     Mental Status: She is alert and oriented to person, place, and time.  Psychiatric:        Mood and Affect: Mood normal.        Behavior: Behavior normal.        Thought Content: Thought content normal.        Judgment: Judgment normal.     BP 118/88 (BP Location: Left Arm, Patient Position: Sitting, Cuff Size: Normal)   Pulse 78   Temp 97.8 F (36.6 C) (Oral)   Resp 18   Ht 5' 4 (1.626 m)   Wt 164 lb 12.8 oz (74.8 kg)   SpO2 98%   BMI 28.29 kg/m  Wt Readings from Last 3 Encounters:  02/03/24 164 lb 12.8 oz (74.8 kg)  01/15/24 154 lb 15.7 oz (70.3 kg)  11/11/23 155 lb (70.3 kg)    Diabetic Foot Exam - Simple   No data filed    Lab Results  Component Value Date   WBC 8.3 01/15/2024   HGB 14.3 01/15/2024   HCT 45.6 01/15/2024   PLT 275 01/15/2024   GLUCOSE 87 01/15/2024   CHOL 199 07/10/2023   TRIG 178.0 (H) 07/10/2023   HDL 52.20 07/10/2023   LDLDIRECT 144 (H) 10/22/2022   LDLCALC 111 (H) 07/10/2023   ALT 7 01/15/2024   AST 18 01/15/2024   NA 144 01/15/2024   K 3.8 01/15/2024   CL 109 01/15/2024   CREATININE 0.76 01/15/2024   BUN 7 (L) 01/15/2024   CO2 25 01/15/2024   TSH 0.02 (L) 07/10/2023   INR 1.1 10/12/2018   HGBA1C 5.6 11/02/2018    Lab Results  Component Value  Date   TSH 0.02 (L) 07/10/2023   Lab Results  Component Value Date   WBC 8.3 01/15/2024   HGB 14.3 01/15/2024   HCT 45.6 01/15/2024   MCV 101.6 (H) 01/15/2024   PLT 275 01/15/2024   Lab Results  Component Value Date   NA 144 01/15/2024   K 3.8 01/15/2024   CO2 25 01/15/2024   GLUCOSE 87 01/15/2024   BUN 7 (L) 01/15/2024   CREATININE 0.76 01/15/2024   BILITOT 0.5 01/15/2024   ALKPHOS 85 01/15/2024   AST 18 01/15/2024   ALT 7 01/15/2024   PROT 6.6 01/15/2024   ALBUMIN 4.2 01/15/2024  CALCIUM  9.6 01/15/2024   ANIONGAP 9 01/15/2024   EGFR 72 03/17/2023   GFR 64.79 08/14/2023   Lab Results  Component Value Date   CHOL 199 07/10/2023   Lab Results  Component Value Date   HDL 52.20 07/10/2023   Lab Results  Component Value Date   LDLCALC 111 (H) 07/10/2023   Lab Results  Component Value Date   TRIG 178.0 (H) 07/10/2023   Lab Results  Component Value Date   CHOLHDL 4 07/10/2023   Lab Results  Component Value Date   HGBA1C 5.6 11/02/2018       Assessment & Plan:  Aneurysm -     MR BRAIN W WO CONTRAST; Future -     Ambulatory referral to Neurosurgery -     MR ANGIO HEAD WO CONTRAST; Future  Hypothyroidism, unspecified type Assessment & Plan: Check labs   Orders: -     TSH -     TSH -     Thyroid  Panel With TSH  Coronary artery disease involving native heart without angina pectoris, unspecified vessel or lesion type -     CBC with Differential/Platelet -     CBC with Differential/Platelet -     Comprehensive metabolic panel with GFR  Essential (primary) hypertension -     CBC with Differential/Platelet -     Comprehensive metabolic panel with GFR -     Lipid panel -     TSH  Mixed hyperlipidemia Assessment & Plan: Encourage heart healthy diet such as MIND or DASH diet, increase exercise, avoid trans fats, simple carbohydrates and processed foods, consider a krill or fish or flaxseed oil cap daily.    Orders: -     Comprehensive metabolic  panel with GFR -     Lipid panel -     Lipid panel  Rash -     Clotrimazole -Betamethasone ; Apply to affected area 2 times daily prn  Dispense: 15 g; Refill: 1 -     Ambulatory referral to Dermatology  Polydipsia -     Hemoglobin A1c -     POCT Urinalysis Dipstick (Automated) -     Microalbumin / creatinine urine ratio  Neuropathy -     Comprehensive metabolic panel with GFR -     Hemoglobin A1c -     Sedimentation rate -     POCT Urinalysis Dipstick (Automated) -     Microalbumin / creatinine urine ratio  Chronic intractable headache, unspecified headache type -     MR BRAIN W WO CONTRAST; Future -     Ambulatory referral to Neurosurgery -     MR ANGIO HEAD WO CONTRAST; Future -     Methocarbamol ; Take 1 tablet (500 mg total) by mouth 2 (two) times daily.  Dispense: 45 tablet; Refill: 0  Essential hypertension Assessment & Plan: Well controlled, no changes to meds. Encouraged heart healthy diet such as the DASH diet and exercise as tolerated.     Neuropathic pain of both legs Assessment & Plan: F/u neuro and vascular    Assessment and Plan Assessment & Plan Possible diabetes mellitus   She reports symptoms suggestive of diabetes mellitus, including presyncope, sensation of brain swelling, stiff neck, neuropathy, blurry vision, polydipsia, and polyuria. She has not checked her blood glucose levels and is concerned about the impact of diabetes on her overall health, including headaches and blurred vision. Order blood work to assess for diabetes mellitus and check urine for glucose and blood.  Cerebral aneurysm, nonruptured  She has a known 2 mm cerebral aneurysm and reports severe headaches and hearing loss, possibly related to the aneurysm pressing on a nerve. A neurosurgeon is going to be scheduling a visit with her for the aneurysm. Refer to a neurosurgeon for evaluation and schedule an MRI of the brain.  Headache   She reports severe headaches, particularly on the  right side of her head, with a sensation of brain swelling and pain around the eye socket. Symptoms may be related to her cerebral aneurysm or possible diabetes. Schedule an MRI of the brain to evaluate headache etiology.  Blurred vision   Her blurry vision has worsened since her last eye exam in May. She has not returned to the ophthalmologist due to personal circumstances.  Peripheral neuropathy   She experiences neuropathy with burning sensations and pain in her legs. Although prescribed Cymbalta  for nerve pain, she has not started taking it due to conflicting advice from different doctors. Advise starting Cymbalta  for nerve pain management, as it is not a narcotic and is appropriate for this condition.  Chronic kidney disease   She has a history of kidney problems and previous kidney sepsis, with concerns about recurrent UTIs affecting her kidneys.  Urinary tract infection, recurrent   She has a history of recurrent UTIs but reports not having had one since starting methenamine , which alters the acidity of her urine.  Venous stasis dermatitis   She suspects venous stasis dermatitis, with symptoms of burning and hard, crumbly nails. She has not seen a dermatologist recently due to losing the referral information. Re-refer to a dermatologist for evaluation.  Constipation   She reports chronic constipation, which she associates with symptoms of diabetes.  General Health Maintenance   She declines flu vaccination and has not had recent Pap smears but is not experiencing any gynecological issues. Discussed that Pap smears are not necessary after age 39 unless desired.  Goals of Care   She has not established a living will or power of attorney. She expresses a desire for no funeral and has communicated her wishes to her family. Discuss the importance of establishing a living will and power of attorney.  Follow-up   She has multiple follow-up needs, including with a neurosurgeon and  dermatologist. She is scheduled for a vascular ultrasound and echocardiogram on Friday. Ensure follow-up with the neurosurgeon for the aneurysm and the dermatologist for skin issues. Confirm vascular ultrasound and echocardiogram appointments.  I personally spent a total of 48 minutes in the care of the patient today including preparing to see the patient, getting/reviewing separately obtained history, performing a medically appropriate exam/evaluation, counseling and educating, placing orders, referring and communicating with other health care professionals, and documenting clinical information in the EHR.   Yamileth Hayse R Lowne Chase, DO

## 2024-02-03 NOTE — Assessment & Plan Note (Signed)
 Well controlled, no changes to meds. Encouraged heart healthy diet such as the DASH diet and exercise as tolerated.

## 2024-02-04 ENCOUNTER — Telehealth: Payer: Self-pay

## 2024-02-04 ENCOUNTER — Other Ambulatory Visit: Payer: Self-pay | Admitting: Family Medicine

## 2024-02-04 ENCOUNTER — Ambulatory Visit: Payer: Self-pay | Admitting: Family Medicine

## 2024-02-04 DIAGNOSIS — E039 Hypothyroidism, unspecified: Secondary | ICD-10-CM

## 2024-02-04 LAB — THYROID PANEL WITH TSH
Free Thyroxine Index: 5.6 — ABNORMAL HIGH (ref 1.4–3.8)
T3 Uptake: 34 % (ref 22–35)
T4, Total: 16.6 ug/dL — ABNORMAL HIGH (ref 5.1–11.9)
TSH: 0.15 m[IU]/L — ABNORMAL LOW (ref 0.40–4.50)

## 2024-02-04 NOTE — Progress Notes (Unsigned)
 Patient ID: Kayla [name], female   DOB: 12-Feb-1946, 78 y.o.   MRN: [id number]  Reason for Consult: No chief complaint on file.   Referred by Vannie Reche RAMAN, NP  Subjective:     HPI  Kayla [name] is a 78 y.o. female who presents for evaluation of lower extremity skin changes that trigger this.  She denies any previous history of DVT or varicosities.  She denies significant leg swelling.  She reports a few years ago she went for monoclonal antibody for COVID and the next day developed significant lower extremity cellulitis with blistering.  This has recovered but since then she has had continuous pain and she feels that she has stasis dermatitis with skin hardening and erythema.  She had an appointment with a dermatologist although her brother recently passed away and she had to cancel the appointment to deal with his affairs.  Past Medical History:  Diagnosis Date   Anemia    Anxiety    Asthma    B12 deficiency    Bursitis    Chronic LBP    Chronic UTI    Diastolic dysfunction    Fibromyalgia    GERD (gastroesophageal reflux disease)    Hearing impaired person, bilateral    per pt   History of low potassium    Hyperlipemia    Hypertension    Hypothyroid    IBS (irritable bowel syndrome)    Inflammatory osteoarthritis    Kidney infection    Psoriatic arthritis (HCC)    S/P cardiac cath 11/30/2007   normal coronaries - Dr. Wonda (Dr. Mona reviewed films on 01/10/2016)   Family History  Problem Relation Age of Onset   Cancer Mother        oral   Stroke Mother    Alzheimer's disease Mother        at 56   Heart attack Father 8   Coronary artery disease Brother    Heart attack Brother    Brain cancer Maternal Grandmother    Congestive Heart Failure Maternal Grandfather    Heart disease Maternal Grandfather    Heart attack Paternal Grandfather    Cancer Maternal Aunt        breast   Alcohol abuse Maternal Aunt    Alcohol abuse Maternal Uncle    Cancer Maternal Uncle         lung, stomach, oral   Cancer Paternal Uncle        lung   Cancer Paternal Uncle        bone   Lung cancer Other        uncle, non smoker   Leukemia Other        uncle   Alzheimer's disease Other        maternal great grandmother    Colon cancer Neg Hx    Breast cancer Neg Hx    Diabetes Neg Hx    Past Surgical History:  Procedure Laterality Date   CESAREAN SECTION  06/10/1975   CHOLECYSTECTOMY     LUMBAR LAMINECTOMY/DECOMPRESSION MICRODISCECTOMY Right 05/25/2020   Procedure: MICRODISCECTOMY Right Lumbar three - four;  Surgeon: Mavis Purchase, MD;  Location: [facility] OR;  Service: Neurosurgery;  Laterality: Right;   NECK SURGERY  1998   RADIOLOGY WITH ANESTHESIA N/A 05/16/2020   Procedure: MRI LUMBAR SPINE W/CONTRAST;  Surgeon: Radiologist, Medication, MD;  Location: [facility] OR;  Service: Radiology;  Laterality: N/A;   repair broken C6 & C7      Short  Social History:  Social History   Tobacco Use   Smoking status: Never    Passive exposure: Never   Smokeless tobacco: Never  Substance Use Topics   Alcohol use: Never    Allergies  Allergen Reactions   Ace Inhibitors Anaphylaxis and Other (See Comments)    Glossal edema   Lisinopril Anaphylaxis   Ciprofloxacin  Other (See Comments)    Told by Rheumatologist not to take it d/t to RA   Fentanyl  Swelling and Other (See Comments)    Tongue swelling POSSIBLY   Iohexol  Swelling and Other (See Comments)    Tongue swelling POSSIBLY    Current Outpatient Medications  Medication Sig Dispense Refill   acetaminophen  (TYLENOL ) 500 MG tablet Take 1,000 mg by mouth in the morning, at noon, in the evening, and at bedtime.     amLODipine  (NORVASC ) 5 MG tablet TAKE 1 TABLET (5 MG TOTAL) BY MOUTH DAILY. 90 tablet 3   amoxicillin -clavulanate (AUGMENTIN ) 875-125 MG tablet Take 1 tablet by mouth every 12 (twelve) hours. 14 tablet 0   Azelastine -Fluticasone  137-50 MCG/ACT SUSP Place 1 spray into the nose every 12 (twelve) hours. (Patient not  taking: Reported on 02/03/2024) 23 g 5   Cholecalciferol (VITAMIN D -3 PO) Take 1 capsule by mouth daily.     clotrimazole -betamethasone  (LOTRISONE ) cream Apply to affected area 2 times daily prn 15 g 1   [employer] VITAMIN B12 1000 MCG tablet Take 2,000 mcg by mouth daily.     DULoxetine  (CYMBALTA ) 30 MG capsule Take 1 capsule (30 mg total) by mouth at bedtime. (Patient not taking: Reported on 02/03/2024) 30 capsule 5   estradiol  (ESTRACE ) 0.1 MG/GM vaginal cream Apply a pea-sized amount of cream to your vaginal opening using your index finger 3 nights a week (Patient not taking: Reported on 02/03/2024) 42.5 g 3   hydrALAZINE  (APRESOLINE ) 10 MG tablet Take 1 tablet (10 mg total) by mouth 3 (three) times daily as needed (for elevated blood pressure (top number more than 140, bottom number more than 90)). (Patient not taking: Reported on 02/03/2024) 90 tablet 3   hydrOXYzine  (ATARAX ) 25 MG tablet Take 1 tablet (25 mg total) by mouth every 8 (eight) hours as needed for itching. 12 tablet 0   levothyroxine  (SYNTHROID ) 175 MCG tablet Take 175 mcg by mouth daily before breakfast.     MAGNESIUM  GLYCINATE PO Take 1 tablet by mouth daily.     methenamine  (HIPREX ) 1 g tablet Take 1 tablet (1 g total) by mouth 2 (two) times daily with a meal. 60 tablet 11   methocarbamol  (ROBAXIN ) 500 MG tablet Take 1 tablet (500 mg total) by mouth 2 (two) times daily. 45 tablet 0   nystatin  cream (MYCOSTATIN ) Apply to affected area 2 times daily (Patient taking differently: Apply 1 Application topically 2 (two) times daily as needed for dry skin (or irritation- affected areas).) 30 g 0   ondansetron  (ZOFRAN -ODT) 4 MG disintegrating tablet Take 1 tablet (4 mg total) by mouth every 8 (eight) hours as needed. (Patient not taking: Reported on 02/03/2024) 12 tablet 0   pantoprazole  (PROTONIX ) 40 MG tablet Take 1 tablet (40 mg total) by mouth daily. (Patient not taking: Reported on 02/03/2024) 30 tablet 3   Potassium 99 MG TABS Take 99 mg  by mouth daily.     potassium chloride  SA (KLOR-CON  M) 20 MEQ tablet Take 1 tablet (20 mEq total) by mouth daily. (Patient not taking: Reported on 02/03/2024) 10 tablet 2   No current facility-administered medications  for this visit.    REVIEW OF SYSTEMS All other systems were reviewed and are negative    Objective:  Objective   There were no vitals filed for this visit. There is no height or weight on file to calculate BMI.  Physical Exam General: no acute distress Cardiac: hemodynamically stable Extremities: Minimal swelling bilaterally, some hardening of the skin behind her ankles and erythema of the ankles and toes.  No ulceration.  No varicosities. Vascular:   Right: palpable PT  Left: palpable PT   Data: Reflux study Venous Reflux Times  +--------------+---------+------+-----------+------------+--------+  RIGHT        Reflux NoRefluxReflux TimeDiameter cmsComments                          Yes                                   +--------------+---------+------+-----------+------------+--------+  CFV          no                                              +--------------+---------+------+-----------+------------+--------+  FV prox       no                                              +--------------+---------+------+-----------+------------+--------+  FV mid        no                                              +--------------+---------+------+-----------+------------+--------+  FV dist       no                                              +--------------+---------+------+-----------+------------+--------+  Popliteal    no                                              +--------------+---------+------+-----------+------------+--------+  GSV at [facility]    no                            0.74              +--------------+---------+------+-----------+------------+--------+  GSV prox thigh                               0.38              +--------------+---------+------+-----------+------------+--------+  GSV mid thigh no                            0.48              +--------------+---------+------+-----------+------------+--------+  GSV  dist thighno                            0.42              +--------------+---------+------+-----------+------------+--------+  GSV at knee   no                            0.33              +--------------+---------+------+-----------+------------+--------+  GSV prox calf no                            0.39              +--------------+---------+------+-----------+------------+--------+  GSV mid calf  no                            0.27              +--------------+---------+------+-----------+------------+--------+  GSV dist calf no                            0.27              +--------------+---------+------+-----------+------------+--------+  SSV at [facility]    no                            0.38              +--------------+---------+------+-----------+------------+--------+  SSV prox calf no                            0.28              +--------------+---------+------+-----------+------------+--------+  SSV mid calf  no                            0.20              +--------------+---------+------+-----------+------------+--------+       Assessment/Plan:     Kayla [name] is a 78 y.o. female with chronic lower extremity pain.  She has had a significant bout of cellulitis with blistering years ago and feels that she has never recovered since then.  I explained that she does not have typical symptoms of venous insufficiency and the ultrasound today did not demonstrate any venous reflux.  I also explained that she does not have PAD as she has palpable PT pulses bilaterally.  Encouraged to reschedule her dermatologist appointment. Follow-up as needed      Norman GORMAN Serve MD [facility]

## 2024-02-04 NOTE — Telephone Encounter (Signed)
 Copied from CRM [id number]. Topic: Clinical - Lab/Test Results >> Feb 04, 2024  3:30 PM Mercedes MATSU wrote: Reason for CRM: Patient called in to get some information on her most recent labs. She said one lab showed her possibly having a UTI and she would like an antibiotic. Patient requesting a call back and can be reached at [phone].

## 2024-02-04 NOTE — Telephone Encounter (Signed)
Culture not back yet.

## 2024-02-05 ENCOUNTER — Telehealth: Payer: Self-pay

## 2024-02-05 ENCOUNTER — Other Ambulatory Visit: Payer: Self-pay | Admitting: Family Medicine

## 2024-02-05 DIAGNOSIS — N390 Urinary tract infection, site not specified: Secondary | ICD-10-CM

## 2024-02-05 LAB — URINE CULTURE
MICRO NUMBER:: 17157372
SPECIMEN QUALITY:: ADEQUATE

## 2024-02-05 MED ORDER — LEVOTHYROXINE SODIUM 125 MCG PO TABS: 125.0000 ug | ORAL_TABLET | Freq: Every day | ORAL | 1 refills | Status: DC

## 2024-02-05 MED ORDER — AMOXICILLIN-POT CLAVULANATE 875-125 MG PO TABS: 1.0000 | ORAL_TABLET | Freq: Two times a day (BID) | ORAL | 0 refills | Status: DC

## 2024-02-05 NOTE — Telephone Encounter (Unsigned)
 Copied from CRM #[id number]. Topic: Clinical - Lab/Test Results >> Feb 05, 2024  4:39 PM Kayla [name] wrote: Pt calling to discuss lab results, Please call pt at [phone]

## 2024-02-05 NOTE — Telephone Encounter (Signed)
 Copied from CRM #[id number]. Topic: Clinical - Lab/Test Results >> Feb 05, 2024  2:05 PM Kayla [name] ORN wrote: Reason for CRM: pt calling  for results and f/u on lab callback . Advised pcp has reviewed results yesterday and responded regarding thyroid  med SYNTHROID . Please f/u with pt regarding urine culture/ uti , diabetes  [phone] (M)

## 2024-02-06 ENCOUNTER — Encounter: Payer: Self-pay | Admitting: Vascular Surgery

## 2024-02-06 ENCOUNTER — Ambulatory Visit (HOSPITAL_COMMUNITY)
Admission: RE | Admit: 2024-02-06 | Discharge: 2024-02-06 | Disposition: A | Discharge status: Home / Self Care | Source: Ambulatory Visit | Attending: Vascular Surgery | Admitting: Vascular Surgery

## 2024-02-06 ENCOUNTER — Ambulatory Visit: Admitting: Vascular Surgery

## 2024-02-06 VITALS — BP 141/89 | HR 63 | Temp 98.1°F | Resp 22 | Ht 64.0 in | Wt 164.5 lb

## 2024-02-06 DIAGNOSIS — I872 Venous insufficiency (chronic) (peripheral): Secondary | ICD-10-CM | POA: Insufficient documentation

## 2024-02-06 DIAGNOSIS — L309 Dermatitis, unspecified: Secondary | ICD-10-CM | POA: Diagnosis not present

## 2024-02-06 NOTE — Telephone Encounter (Signed)
 Noted. Pt viewed via Mychart

## 2024-02-10 ENCOUNTER — Telehealth: Payer: Self-pay

## 2024-02-10 NOTE — Telephone Encounter (Signed)
 Patient indicates that she is having swelling like headaches. She describes these as feeling like her brain is swelling.   We discussed her neurological state. She denies any symptoms of acute vision changes, any hearing changes or having the worst headache of her life. She has no numbness, tingling or loss of function in the extremities.   She feels like her headache is about a 7 or 8 out of 10. Her pain is located from the eye brows to the back of her head over the top and into her neck.  She indicates that recently her brother passed and this has been a stressor in her life and may have worsened her headaches.   Her vision is worsening per the patient. She saw an eye doctor recently.   She is coming to see Dr. Janjua on Friday for evaluation.   She is aware if she develops the worst headache of her life, any numbness, tingling and loss of function of the extremities, sudden changes in her vision or feels extremely drowsy then she should go to the Emergency Room promptly.

## 2024-02-13 ENCOUNTER — Telehealth: Payer: Self-pay | Admitting: Family Medicine

## 2024-02-13 ENCOUNTER — Ambulatory Visit: Admitting: Neurosurgery

## 2024-02-13 ENCOUNTER — Encounter: Payer: Self-pay | Admitting: Neurosurgery

## 2024-02-13 VITALS — BP 144/84 | HR 68 | Temp 97.4°F | Ht 63.0 in | Wt 164.0 lb

## 2024-02-13 DIAGNOSIS — I671 Cerebral aneurysm, nonruptured: Secondary | ICD-10-CM

## 2024-02-13 DIAGNOSIS — R519 Headache, unspecified: Secondary | ICD-10-CM

## 2024-02-13 NOTE — Telephone Encounter (Signed)
 Copied from CRM [id number]. Topic: Referral - Request for Referral >> Feb 12, 2024  3:17 PM Jasmin G wrote: Did the patient discuss referral with their provider in the last year? Yes (If No - schedule appointment) (If Yes - send message)  Appointment offered? Yes  Type of order/referral and detailed reason for visit: Dermatologist. Pt states that she was referred to a clinic recently but was told they couldn't get her in until mid 2026, pt states that she cannot wait that long and is requesting for another referral and for the reason of the referral to also be for possible dermatitis as she states that only a rash was mentioned.  Preference of office, provider, location: Not discussed  If referral order, have you been seen by this specialty before? No (If Yes, this issue or another issue? When? Where?  Can we respond through MyChart? Yes. Pt requested to be contacted through MyChart once referral is in.

## 2024-02-13 NOTE — Progress Notes (Signed)
 Assessment : Discussed the use of AI scribe software for clinical note transcription with the patient, who gave verbal consent to proceed.  History of Present Illness Kayla [name] is a 78 year old female with neuropathy and a small brain aneurysm who presents with worsening headaches and balance issues. She was referred by her primary care doctor for evaluation of a brain aneurysm.  Her symptoms began with neuropathy in her legs, affecting her balance. She has been under the care of a neurologist for this condition. In November, she experienced a fall due to balance issues, resulting in a face-first impact on the bathroom tile floor. She did not sustain significant injuries but began experiencing headaches following the incident. In May, she had another fall in the kitchen, which she described as feeling like being 'hit in the head with a baseball bat.' This incident led to a diagnosis of a concussion and a small, 2mm brain aneurysm.  She describes her current headaches as feeling like her brain is swelling, with pain radiating down her neck and stiffness around her ears and throat. She reports difficulty staying hydrated despite drinking four bottles of water daily, along with Powerade. Her vision, which improved with new glasses in May, has since become blurry again. She also experiences tinnitus and worsening hearing in one ear, which she was told might be related to the location of the aneurysm near the ear nerve.  Her past medical history includes three types of arthritis, fibromyalgia, and chronic kidney disease, although there is some disagreement among her doctors about the latter diagnosis. She has a history of chronic urinary tract infections and is on medication to reduce urine acidity. She also reports memory issues and describes having 'senior moments.'  No fever or seizures.    Plan : I am really sorry to hear that she is doing so poorly.  I think that her current condition with all  the emotional stress from her brother passing away is probably taking an enormous toll on her and she was crying in clinic describing all of that.  I told her that this tiny aneurysm is completely insignificant at her age of 65 and more likely than not is actually a normal blood vessel origin rather than a true aneurysm.  I told her that if she was my mother, I would definitely not worry about it and I would not recommend getting follow-up imaging for it.  She is very worried that she has brain swelling and I tried to convince her that without any etiology it is highly unlikely that she truly does have brain swelling and reason with her but unfortunately she remains worried.  To put her mind at ease, she wants another head CT done which I will order.  I will give her a call thereafter.   Social History   Socioeconomic History   Marital status: Widowed    Spouse name: Not on file   Number of children: 2   Years of education: Not on file   Highest education level: Bachelor's degree (e.g., BA, AB, BS)  Occupational History   Not on file  Tobacco Use   Smoking status: Never    Passive exposure: Never   Smokeless tobacco: Never  Vaping Use   Vaping status: Never Used  Substance and Sexual Activity   Alcohol use: Never   Drug use: No   Sexual activity: Not Currently    Birth control/protection: Post-menopausal  Other Topics Concern   Not on file  Social  History Narrative   Lost husband 2009, lost mother 2016      Lives at home (son & 3 grandchildren live with her)   Caffeine: 10-12 oz daily   Social Drivers of Corporate Investment Banker Strain: Not on file  Food Insecurity: Not on file  Transportation Needs: Not on file  Physical Activity: Not on file  Stress: Not on file  Social Connections: Not on file  Intimate Partner Violence: Not on file    Family History  Problem Relation Age of Onset   Cancer Mother        oral   Stroke Mother    Alzheimer's disease Mother         at 28   Heart attack Father 88   Coronary artery disease Brother    Heart attack Brother    Brain cancer Maternal Grandmother    Congestive Heart Failure Maternal Grandfather    Heart disease Maternal Grandfather    Heart attack Paternal Grandfather    Cancer Maternal Aunt        breast   Alcohol abuse Maternal Aunt    Alcohol abuse Maternal Uncle    Cancer Maternal Uncle        lung, stomach, oral   Cancer Paternal Uncle        lung   Cancer Paternal Uncle        bone   Lung cancer Other        uncle, non smoker   Leukemia Other        uncle   Alzheimer's disease Other        maternal great grandmother    Colon cancer Neg Hx    Breast cancer Neg Hx    Diabetes Neg Hx     Allergies  Allergen Reactions   Ace Inhibitors Anaphylaxis and Other (See Comments)    Glossal edema   Lisinopril Anaphylaxis   Ciprofloxacin  Other (See Comments)    Told by Rheumatologist not to take it d/t to RA   Fentanyl  Swelling and Other (See Comments)    Tongue swelling POSSIBLY   Iohexol  Swelling and Other (See Comments)    Tongue swelling POSSIBLY    Past Medical History:  Diagnosis Date   Anemia    Anxiety    Asthma    B12 deficiency    Bursitis    Chronic LBP    Chronic UTI    Diastolic dysfunction    Fibromyalgia    GERD (gastroesophageal reflux disease)    Hearing impaired person, bilateral    per pt   History of low potassium    Hyperlipemia    Hypertension    Hypothyroid    IBS (irritable bowel syndrome)    Inflammatory osteoarthritis    Kidney infection    Peripheral vascular disease    Psoriatic arthritis (HCC)    S/P cardiac cath 11/30/2007   normal coronaries - Dr. Wonda (Dr. Mona reviewed films on 01/10/2016)    Past Surgical History:  Procedure Laterality Date   CESAREAN SECTION  06/10/1975   CHOLECYSTECTOMY     LUMBAR LAMINECTOMY/DECOMPRESSION MICRODISCECTOMY Right 05/25/2020   Procedure: MICRODISCECTOMY Right Lumbar three - four;  Surgeon: Mavis Purchase, MD;  Location: [facility] OR;  Service: Neurosurgery;  Laterality: Right;   NECK SURGERY  1998   RADIOLOGY WITH ANESTHESIA N/A 05/16/2020   Procedure: MRI LUMBAR SPINE W/CONTRAST;  Surgeon: Radiologist, Medication, MD;  Location: [facility] OR;  Service: Radiology;  Laterality: N/A;   repair  broken C6 & C7       Physical Exam   Physical Exam HENT:     Head: Normocephalic.     Nose: Nose normal.  Eyes:     Pupils: Pupils are equal, round, and reactive to light.  Cardiovascular:     Rate and Rhythm: Normal rate.  Pulmonary:     Effort: Pulmonary effort is normal.  Abdominal:     General: Abdomen is flat.  Musculoskeletal:     Cervical back: Normal range of motion.  Neurological:     Mental Status: She is alert.     Cranial Nerves: Cranial nerves 2-12 are intact.     Sensory: Sensation is intact.     Motor: Motor function is intact.     Coordination: Coordination is intact.     Results for orders placed or performed during the hospital encounter of 08/23/23  CT Head Wo Contrast   Narrative   CLINICAL DATA:  Head trauma, face trauma, head and neck trauma. Fall injury.  EXAM: CT HEAD WITHOUT CONTRAST  CT MAXILLOFACIAL WITHOUT CONTRAST  CT CERVICAL SPINE WITHOUT CONTRAST  TECHNIQUE: Multidetector CT imaging of the head, cervical spine, and maxillofacial structures were performed using the standard protocol without intravenous contrast. Multiplanar CT image reconstructions of the cervical spine and maxillofacial structures were also generated.  RADIATION DOSE REDUCTION: This exam was performed according to the departmental dose-optimization program which includes automated exposure control, adjustment of the mA and/or kV according to patient size and/or use of iterative reconstruction technique.  COMPARISON:  Head, face and cervical spine CTs from 02/25/2023.  FINDINGS: CT HEAD FINDINGS  Brain: There is mild global atrophy, mild small-vessel disease of the cerebral  white matter. The ventricles are normal in size and position.  No cortical based acute infarct, hemorrhage, mass or mass effect are seen. Basal cisterns are clear. Partially empty sella is again noted.  Vascular: Scattered calcific plaque both siphons. No hyperdense central vasculature.  Skull: Negative for fractures or focal lesions. Small left frontal scalp hematoma.  Other: None.  CT MAXILLOFACIAL FINDINGS  Osseous: No fracture or mandibular dislocation. No destructive process.  There is a chronic healed fracture deformity of the anterior left zygomatic arch.  There is multifocal tooth decay and periapical bone lucencies. Follow-up with a dentist is recommended.  Orbits: Negative. No traumatic or inflammatory finding.  Sinuses: Chronic patchy fluid in both mastoid tips. Otherwise clear mastoids and middle ears.  Clear sinuses and ostiomeatal complexes. Clear nasal passages with slight S shaped nasal septum.  Soft tissues: No facial hematoma or mass.  CT CERVICAL SPINE FINDINGS  Alignment: Chronic bone-on-bone anterior atlantodental joint space loss osteophytosis. Otherwise normal alignment. Unchanged.  Skull base and vertebrae: There is osteopenia. No acute fracture is evident. No primary bone lesion or focal pathologic process.  There is non-plated interbody and facet ankylosis of C3-4, anterior plated mature fusion C6-7.  Other disc spaces are patent.  Other facet joints are patent.  Soft tissues and spinal canal: No prevertebral fluid or swelling. No visible canal hematoma.  There are calcific plaques in both proximal cervical ICAs. Thyroid  gland is very small consistent with severe atrophy versus prior ablation.  Disc levels: Mild disc narrowing again C2-3, C5-6 and C7-T1, with normal C4-5 disc height.  There is slight posterior endplate spurring at C4-5 and C5-6, but no significant thecal sac encroachment. There are no herniated discs or cord  compromise.  Facet joint and uncinate spurring causes moderate to severe right  foraminal stenosis at C4-5, mild left foraminal stenosis C5-6. There is right-greater-than-left facet arthrosis at most levels.  Other foramina are widely patent. No evidence of C6-7 hardware loosening.  Upper chest: Negative.  Other: None.  IMPRESSION: 1. No acute intracranial CT findings or depressed skull fractures. Left forehead scalp hematoma. 2. Stable atrophy and small-vessel disease. 3. No acute facial CT findings. 4. Osteopenia, postsurgical and degenerative change without evidence of cervical fractures. 5. Multifocal tooth decay and periapical bone lucencies. Follow-up with a dentist is recommended. 6. Chronic patchy fluid in both mastoid tips. 7. Carotid ICA atherosclerosis.   Electronically Signed   By: Francis Quam M.D.   On: 08/23/2023 21:59   Results for orders placed or performed in visit on 11/16/98  MR Brain W Wo Contrast   Narrative   FINDINGS CLINICAL:   DIZZINESS AND VERTIGO. MR OF THE BRAIN WITH CONTRAST ADMINISTRATION: NO COMPARISON EXAMS. FINDINGS: THERE IS NO EVIDENCE OF A CEREBELLOPONTINE ANGLE MASS.   THE CAROTID ARTERIES HAVE A SYMMETRICAL APPEARANCE.   THERE IS NO EVIDENCE OF AN INFARCT.   NO EVIDENCE OF ABNORMAL INTRACRANIAL ENHANCING LESION.   NO EVIDENCE OF ABNORMAL SIGNAL INTENSITY TO SUGGEST A DEMYELINATING PROCESS.   THERE IS A PARTIALLY EMPTY SELLA.   THE PINEAL REGION AND CERVICOMEDULLARY JUNCTION ARE UNREMARKABLE.   THE MAJOR VASCULAR STRUCTURES ARE PATENT. THE ORBITAL STRUCTURES ARE WITHIN NORMAL LIMITS.  NO EVIDENCE OF SKULL BASE ABNORMALITY.   VISUALIZED SINUSES AND MASTOID AIR CELLS ARE CLEAR. IMPRESSION MR OF THE BRAIN REVEALS A PARTIAL EMPTY SELLA BUT IS OTHERWISE UNREMARKABLE.

## 2024-02-24 ENCOUNTER — Telehealth: Payer: Self-pay | Admitting: Family Medicine

## 2024-02-24 NOTE — Telephone Encounter (Signed)
 Copied from CRM [id number]. Topic: Medicare AWV >> Feb 24, 2024 11:56 AM Nathanel DEL wrote: Called LVM 02/24/2024 to sched AWV. Please schedule in office or virtual visit.   Nathanel Paschal; Care Guide Ambulatory Clinical Support  l [facility] Direct Dial: [phone]

## 2024-02-28 ENCOUNTER — Other Ambulatory Visit: Payer: Self-pay | Admitting: Family Medicine

## 2024-03-08 ENCOUNTER — Emergency Department (HOSPITAL_COMMUNITY)
Admission: EM | Admit: 2024-03-08 | Discharge: 2024-03-08 | Disposition: A | Discharge status: Home / Self Care | Attending: Emergency Medicine | Admitting: Emergency Medicine

## 2024-03-08 ENCOUNTER — Emergency Department (HOSPITAL_COMMUNITY)

## 2024-03-08 ENCOUNTER — Other Ambulatory Visit: Payer: Self-pay

## 2024-03-08 DIAGNOSIS — Z79899 Other long term (current) drug therapy: Secondary | ICD-10-CM | POA: Insufficient documentation

## 2024-03-08 DIAGNOSIS — B372 Candidiasis of skin and nail: Secondary | ICD-10-CM | POA: Diagnosis not present

## 2024-03-08 DIAGNOSIS — J45909 Unspecified asthma, uncomplicated: Secondary | ICD-10-CM | POA: Insufficient documentation

## 2024-03-08 DIAGNOSIS — N3000 Acute cystitis without hematuria: Secondary | ICD-10-CM | POA: Insufficient documentation

## 2024-03-08 DIAGNOSIS — I10 Essential (primary) hypertension: Secondary | ICD-10-CM | POA: Insufficient documentation

## 2024-03-08 DIAGNOSIS — L309 Dermatitis, unspecified: Secondary | ICD-10-CM | POA: Diagnosis not present

## 2024-03-08 DIAGNOSIS — E039 Hypothyroidism, unspecified: Secondary | ICD-10-CM | POA: Insufficient documentation

## 2024-03-08 DIAGNOSIS — R300 Dysuria: Secondary | ICD-10-CM | POA: Diagnosis present

## 2024-03-08 LAB — CBC WITH DIFFERENTIAL/PLATELET
Abs Immature Granulocytes: 0.01 K/uL (ref 0.00–0.07)
Basophils Absolute: 0.1 K/uL (ref 0.0–0.1)
Basophils Relative: 1 %
Eosinophils Absolute: 0.3 K/uL (ref 0.0–0.5)
Eosinophils Relative: 3 %
HCT: 45.4 % (ref 36.0–46.0)
Hemoglobin: 14.6 g/dL (ref 12.0–15.0)
Immature Granulocytes: 0 %
Lymphocytes Relative: 26 %
Lymphs Abs: 2.6 K/uL (ref 0.7–4.0)
MCH: 32.4 pg (ref 26.0–34.0)
MCHC: 32.2 g/dL (ref 30.0–36.0)
MCV: 100.7 fL — ABNORMAL HIGH (ref 80.0–100.0)
Monocytes Absolute: 0.8 K/uL (ref 0.1–1.0)
Monocytes Relative: 8 %
Neutro Abs: 6.2 K/uL (ref 1.7–7.7)
Neutrophils Relative %: 62 %
Platelets: 288 K/uL (ref 150–400)
RBC: 4.51 MIL/uL (ref 3.87–5.11)
RDW: 11.9 % (ref 11.5–15.5)
WBC: 10 K/uL (ref 4.0–10.5)
nRBC: 0 % (ref 0.0–0.2)

## 2024-03-08 LAB — URINALYSIS, W/ REFLEX TO CULTURE (INFECTION SUSPECTED)
Bacteria, UA: NONE SEEN
Bilirubin Urine: NEGATIVE
Glucose, UA: NEGATIVE mg/dL
Ketones, ur: NEGATIVE mg/dL
Nitrite: NEGATIVE
Protein, ur: 30 mg/dL — AB
Specific Gravity, Urine: 1.02 (ref 1.005–1.030)
WBC, UA: >50 WBC/hpf (ref 0–5)
pH: 5 (ref 5.0–8.0)

## 2024-03-08 LAB — COMPREHENSIVE METABOLIC PANEL WITH GFR
ALT: 8 U/L (ref 0–44)
AST: 16 U/L (ref 15–41)
Albumin: 4.5 g/dL (ref 3.5–5.0)
Alkaline Phosphatase: 84 U/L (ref 38–126)
Anion gap: 10 (ref 5–15)
BUN: 16 mg/dL (ref 8–23)
CO2: 26 mmol/L (ref 22–32)
Calcium: 10.2 mg/dL (ref 8.9–10.3)
Chloride: 107 mmol/L (ref 98–111)
Creatinine, Ser: 0.77 mg/dL (ref 0.44–1.00)
GFR, Estimated: >60 mL/min (ref >60)
Glucose, Bld: 105 mg/dL — ABNORMAL HIGH (ref 70–99)
Potassium: 3.9 mmol/L (ref 3.5–5.1)
Sodium: 143 mmol/L (ref 135–145)
Total Bilirubin: 0.3 mg/dL (ref 0.0–1.2)
Total Protein: 7.5 g/dL (ref 6.5–8.1)

## 2024-03-08 MED ORDER — CEPHALEXIN 500 MG PO CAPS: 500.0000 mg | ORAL_CAPSULE | Freq: Two times a day (BID) | ORAL | 0 refills | Status: AC

## 2024-03-08 MED ORDER — HYDROXYZINE HCL 25 MG PO TABS: 25.0000 mg | ORAL_TABLET | Freq: Three times a day (TID) | ORAL | 0 refills | Status: AC | PRN

## 2024-03-08 MED ORDER — SODIUM CHLORIDE 0.9 % IV SOLN
1.0000 g | Freq: Once | INTRAVENOUS | Status: AC
  Administered 2024-03-08: 1 g via INTRAVENOUS
  Filled 2024-03-08: qty 10

## 2024-03-08 NOTE — ED Triage Notes (Addendum)
**  PT HARD OF HEARING  Pt ambulatory to triage with complaints of bladder pressure and dysuria. Pt reports an extensive hx of urinary issues, and UTIs. Pt endorses a hx of sepsis from UTI, and is deeply concerned.    Pt answers triage questions in story format.

## 2024-03-08 NOTE — ED Provider Notes (Signed)
 [place] EMERGENCY DEPARTMENT AT [facility] Provider Note   CSN: [id number] Arrival date & time: 03/08/24  1239     History  No chief complaint on file.   Kayla [name] is a 78 y.o. female with PMH as listed below who presents with multiple complaints. Primarily, the patient reports bladder pressure and dysuria. Pt reports an extensive hx of urinary issues, and UTIs. Pt endorses a hx of sepsis from UTI, and is deeply concerned. She denies f/c at home, denies nausea/vomiting.   She also endorses a chronic rash on her lower back that is pruritic. She had appts to f/u with dermatology for this rash but her brother passed away and she was dealing with the sequelae of that and so missed her appts. Has tried over the counter heavy cream lotion which she states helps. No peeling skin or hives noted. Also notes rash underneath her breasts and in her inguinal region that is also itchy and red. Has tried clotrimazole  for that as well which does help somewhat.    Past Medical History:  Diagnosis Date   Anemia    Anxiety    Asthma    B12 deficiency    Bursitis    Chronic LBP    Chronic UTI    Diastolic dysfunction    Fibromyalgia    GERD (gastroesophageal reflux disease)    Hearing impaired person, bilateral    per pt   History of low potassium    Hyperlipemia    Hypertension    Hypothyroid    IBS (irritable bowel syndrome)    Inflammatory osteoarthritis    Kidney infection    Peripheral vascular disease    Psoriatic arthritis (HCC)    S/P cardiac cath 11/30/2007   normal coronaries - Dr. Wonda (Dr. Mona reviewed films on 01/10/2016)       Home Medications Prior to Admission medications   Medication Sig Start Date End Date Taking? Authorizing Provider  acetaminophen  (TYLENOL ) 500 MG tablet Take 1,000 mg by mouth in the morning, at noon, in the evening, and at bedtime.    [provider]  amLODipine  (NORVASC ) 5 MG tablet TAKE 1 TABLET (5 MG TOTAL) BY MOUTH  DAILY. 11/11/23   Lonni Slain, MD  amoxicillin -clavulanate (AUGMENTIN ) 875-125 MG tablet Take 1 tablet by mouth every 12 (twelve) hours. 11/12/23   Trine Raynell Moder, MD  amoxicillin -clavulanate (AUGMENTIN ) 875-125 MG tablet Take 1 tablet by mouth 2 (two) times daily. 02/05/24   Lowne Chase, Yvonne R, DO  Azelastine -Fluticasone  137-50 MCG/ACT SUSP Place 1 spray into the nose every 12 (twelve) hours. Patient not taking: Reported on 02/06/2024 07/10/23   Lowne Chase, Yvonne R, DO  Cholecalciferol (VITAMIN D -3 PO) Take 1 capsule by mouth daily.    [provider]  clotrimazole -betamethasone  (LOTRISONE ) cream Apply to affected area 2 times daily prn 02/03/24   Antonio Meth, Yvonne R, DO  [employer] VITAMIN B12 1000 MCG tablet Take 2,000 mcg by mouth daily.    [provider]  DULoxetine  (CYMBALTA ) 30 MG capsule Take 1 capsule (30 mg total) by mouth at bedtime. 09/17/23   Leigh Venetia CROME, MD  estradiol  (ESTRACE ) 0.1 MG/GM vaginal cream Apply a pea-sized amount of cream to your vaginal opening using your index finger 3 nights a week Patient not taking: Reported on 02/06/2024 09/03/23   Matilda Senior, MD  hydrALAZINE  (APRESOLINE ) 10 MG tablet Take 1 tablet (10 mg total) by mouth 3 (three) times daily as needed (for elevated blood pressure (  top number more than 140, bottom number more than 90)). 02/14/23   Lonni Slain, MD  hydrOXYzine  (ATARAX ) 25 MG tablet Take 1 tablet (25 mg total) by mouth every 8 (eight) hours as needed for itching. 04/27/23   Johnie Flaming A, NP  levothyroxine  (SYNTHROID ) 125 MCG tablet Take 1 tablet (125 mcg total) by mouth daily before breakfast. 02/05/24   Antonio Meth, Yvonne R, DO  MAGNESIUM  GLYCINATE PO Take 1 tablet by mouth daily.    [provider]  methenamine  (HIPREX ) 1 g tablet Take 1 tablet (1 g total) by mouth 2 (two) times daily with a meal. 09/03/23   Matilda Senior, MD  methocarbamol  (ROBAXIN ) 500 MG tablet Take 1 tablet (500  mg total) by mouth 2 (two) times daily. 02/03/24   Lowne Chase, Yvonne R, DO  nystatin  cream (MYCOSTATIN ) Apply to affected area 2 times daily Patient taking differently: Apply 1 Application topically 2 (two) times daily as needed for dry skin (or irritation- affected areas). 04/27/23   Johnie Flaming A, NP  ondansetron  (ZOFRAN -ODT) 4 MG disintegrating tablet Take 1 tablet (4 mg total) by mouth every 8 (eight) hours as needed. 08/03/23   Griselda Norris, MD  pantoprazole  (PROTONIX ) 40 MG tablet Take 1 tablet (40 mg total) by mouth daily. Patient not taking: Reported on 02/06/2024 07/10/23   Antonio Meth, Yvonne R, DO  Potassium 99 MG TABS Take 99 mg by mouth daily.    [provider]  potassium chloride  SA (KLOR-CON  M) 20 MEQ tablet Take 1 tablet (20 mEq total) by mouth daily. Patient not taking: Reported on 02/06/2024 08/18/23   Antonio Meth Jamee JONELLE, DO      Allergies    Ace inhibitors, Lisinopril, Ciprofloxacin , Fentanyl , and Iohexol     Review of Systems   Review of Systems A 10 point review of systems was performed and is negative unless otherwise reported in HPI.  Physical Exam Updated Vital Signs BP (!) 155/94 (BP Location: Right Arm)   Pulse 86   Temp 98.1 F (36.7 C) (Oral)   Resp 16   SpO2 99%  Physical Exam General: Normal appearing female, lying in bed.  HEENT: PERRLA, Sclera anicteric, MMM, trachea midline. No intraoral lesions.  Cardiology: RRR, no murmurs/rubs/gallops.  Resp: Normal respiratory rate and effort. CTAB, no wheezes, rhonchi, crackles.  Abd: Soft, mild suprapubic TTP, non-distended. No rebound tenderness or guarding.  GU: Deferred. MSK: No peripheral edema or signs of trauma. Extremities without deformity or TTP. No cyanosis or clubbing. Skin: warm, dry. Maculopapular pruritic rash on lower back, no flakes or scales, no urticaria, no bullae, pustules, or sloughing skin. Underneath breasts and in inguinal region in intertriginous zones, patient with  erythematous patches that appear candidal in nature.  Back: No CVA tenderness Neuro: A&Ox4, CNs II-XII grossly intact. MAEs. Sensation grossly intact.  Psych: Normal mood and affect.   ED Results / Procedures / Treatments   Labs (all labs ordered are listed, but only abnormal results are displayed) Labs Reviewed  CBC WITH DIFFERENTIAL/PLATELET - Abnormal; Notable for the following components:   MCV 100.7 (*)    All other components within normal limits  COMPREHENSIVE METABOLIC PANEL WITH GFR - Abnormal; Notable for the following components:   Glucose, Bld 105 (*)    All other components within normal limits  URINALYSIS, W/ REFLEX TO CULTURE (INFECTION SUSPECTED) - Abnormal; Notable for the following components:   APPearance CLOUDY (*)    Hgb urine dipstick SMALL (*)    Protein, ur  30 (*)    Leukocytes,Ua MODERATE (*)    All other components within normal limits    EKG None  Radiology No results found.  Procedures Procedures    Medications Ordered in ED Medications  cefTRIAXone  (ROCEPHIN ) 1 g in sodium chloride  0.9 % 100 mL IVPB (0 g Intravenous Stopped 03/08/24 1635)    ED Course/ Medical Decision Making/ A&P                          Medical Decision Making Amount and/or Complexity of Data Reviewed Labs: ordered. Decision-making details documented in ED Course. Radiology: ordered. Decision-making details documented in ED Course.  Risk Prescription drug management.    This patient presents to the ED for concern of lower abdominal pain, urinary sxs, rash; this involves an extensive number of treatment options, and is a complaint that carries with it a high risk of complications and morbidity.  I considered the following differential and admission for this acute, potentially life threatening condition. Overall well-appearing, HDS, non-toxic appearing.   MDM:    Patient with mildly increased WBC to 10.0; no fever or tachycardia or tachypnea to indicate sepsis.  Overall she is very well-appearing and ambulates to the restroom. She has +UTI on UA and likely has acute cystitis. Will give IV ceftriaxone  here. She has reported some lower abdominal pain as well and has h/o diverticulitis, performed shared decision making with patient who would prefer to obtain a CT scan today. CT abd pelvis shows cystitis, no other acute abnormalities. Will prescribe keflex  and obtain urine culture. Renal function at baseline. Advised f/u with PCP within 1 week.    For patient's rash, low c/f acute life threatening rash including SJS, TEN, DRESS, bullous pemphigoid, hives/anaphylaxis, . She has had this rash for weeks to months, , rash does not appear like hives, although it is itchy. No c/f cellulitis/abscess. No c/f erysipelas. Consider dermatitis, atopic dermatitis, contact dermatitis.  She has tried thick lotion but I recommend that she try hydrocortisone cream OTC to help with pruritus. She also requests hydroxyzine  for itching which I mentioned may not be as helpful for this rash but she requests to try so will prescribe PRN hydroxyzine . Her intertriginous rash appears like intertriginous candidiasis and I recommend she continue clotrimazole  cream for this which she had previously stopped d/t expense. I recommend she reach back out to dermatologist to establish care and for further workup.   Clinical Course as of 03/12/24 2024  Mon Mar 08, 2024  1453 WBC: 10.0 Increased from 6-8 [HN]  1453 Urinalysis, w/ Reflex to Culture (Infection Suspected) -Urine, Clean Catch(!) +UTI, no bacteria but +sxs and consistent, cutlure is pending [HN]  1628 CT ABDOMEN PELVIS WO CONTRAST 1. Mild stranding around the urinary bladder. Findings are nonspecific and suspect there is a chronic component. Patient may have underlying cystitis. 2. Negative for kidney stones or hydronephrosis. 3. Colonic diverticulosis without acute inflammation. 4. Chronic left adnexal cyst.   [HN]    Clinical  Course User Index [HN] Franklyn Sid SAILOR, MD    Labs: I Ordered, and personally interpreted labs.  The pertinent results include:  those listed above  Imaging Studies ordered: I ordered imaging studies including CT abd pelvis I independently visualized and interpreted imaging. I agree with the radiologist interpretation  Additional history obtained from chart review.    Reevaluation: After the interventions noted above, I reevaluated the patient and found that they have :improved  Social Determinants  of Health: Lives independently  Disposition:  DC w/ discharge instructions/return precautions. All questions answered to patient's satisfaction.    Co morbidities that complicate the patient evaluation  Past Medical History:  Diagnosis Date   Anemia    Anxiety    Asthma    B12 deficiency    Bursitis    Chronic LBP    Chronic UTI    Diastolic dysfunction    Fibromyalgia    GERD (gastroesophageal reflux disease)    Hearing impaired person, bilateral    per pt   History of low potassium    Hyperlipemia    Hypertension    Hypothyroid    IBS (irritable bowel syndrome)    Inflammatory osteoarthritis    Kidney infection    Peripheral vascular disease    Psoriatic arthritis (HCC)    S/P cardiac cath 11/30/2007   normal coronaries - Dr. Wonda (Dr. Mona reviewed films on 01/10/2016)     Medicines No orders of the defined types were placed in this encounter.   I have reviewed the patients home medicines and have made adjustments as needed  Problem List / ED Course: Problem List Items Addressed This Visit   None Visit Diagnoses       Acute cystitis without hematuria    -  Primary     Dermatitis         Intertriginous candidiasis       Relevant Medications   cefTRIAXone  (ROCEPHIN ) 1 g in sodium chloride  0.9 % 100 mL IVPB (Completed)   cephALEXin  (KEFLEX ) 500 MG capsule                   This note was created using dictation software, which may contain  spelling or grammatical errors.    Franklyn Sid SAILOR, MD 03/12/24 [phone]

## 2024-03-08 NOTE — Discharge Instructions (Addendum)
 Thank you for coming to [facility] Emergency Department. You were seen for urinary symptoms. We did an exam, labs, and imaging, and these showed a urinary tract infection. You were given ceftriaxone  IV here and a prescription for keflex  500 mg twice per day for 5 days which you can start tomorrow.   Please use hydrocortisone cream on your back. Please use clotrimazole  cream previously prescribed under your breasts and in the groin. You can use hydroxyzine  as needed every 8 hours for itching. Please follow up with a dermatologist.  Please follow up with your primary care provider and/or urologist within 1-2 weeks.   Do not hesitate to return to the ED or call 911 if you experience: -Worsening symptoms -Lightheadedness, passing out -Fevers/chills -Anything else that concerns you

## 2024-03-11 LAB — URINE CULTURE: Culture: >=100000 — AB

## 2024-03-18 ENCOUNTER — Encounter (HOSPITAL_BASED_OUTPATIENT_CLINIC_OR_DEPARTMENT_OTHER): Payer: Self-pay | Admitting: Cardiology

## 2024-03-20 ENCOUNTER — Other Ambulatory Visit: Payer: Self-pay | Admitting: Family Medicine

## 2024-03-20 DIAGNOSIS — R21 Rash and other nonspecific skin eruption: Secondary | ICD-10-CM

## 2024-03-26 ENCOUNTER — Encounter: Payer: Self-pay | Admitting: *Deleted

## 2024-03-26 NOTE — Progress Notes (Signed)
 BETTE BRIENZA                                          MRN: [id number]   03/26/2024   The [facility] Quality Team Specialist reviewed this patient medical record for the purposes of chart review for care gap closure. The following were reviewed: chart review for care gap closure-controlling blood pressure.    [facility] Quality Team

## 2024-04-07 ENCOUNTER — Emergency Department (HOSPITAL_COMMUNITY)

## 2024-04-07 ENCOUNTER — Emergency Department (HOSPITAL_COMMUNITY): Admission: EM | Admit: 2024-04-07 | Discharge: 2024-04-07 | Disposition: A | Discharge status: Home / Self Care

## 2024-04-07 DIAGNOSIS — N3000 Acute cystitis without hematuria: Secondary | ICD-10-CM | POA: Insufficient documentation

## 2024-04-07 DIAGNOSIS — I10 Essential (primary) hypertension: Secondary | ICD-10-CM | POA: Insufficient documentation

## 2024-04-07 DIAGNOSIS — W1830XA Fall on same level, unspecified, initial encounter: Secondary | ICD-10-CM | POA: Insufficient documentation

## 2024-04-07 DIAGNOSIS — R300 Dysuria: Secondary | ICD-10-CM | POA: Diagnosis present

## 2024-04-07 DIAGNOSIS — S0990XA Unspecified injury of head, initial encounter: Secondary | ICD-10-CM | POA: Insufficient documentation

## 2024-04-07 DIAGNOSIS — I2510 Atherosclerotic heart disease of native coronary artery without angina pectoris: Secondary | ICD-10-CM | POA: Insufficient documentation

## 2024-04-07 DIAGNOSIS — Z79899 Other long term (current) drug therapy: Secondary | ICD-10-CM | POA: Diagnosis not present

## 2024-04-07 DIAGNOSIS — W19XXXA Unspecified fall, initial encounter: Secondary | ICD-10-CM

## 2024-04-07 DIAGNOSIS — M546 Pain in thoracic spine: Secondary | ICD-10-CM | POA: Diagnosis not present

## 2024-04-07 LAB — URINALYSIS, ROUTINE W REFLEX MICROSCOPIC
Glucose, UA: NEGATIVE mg/dL
Ketones, ur: NEGATIVE mg/dL
Nitrite: NEGATIVE
Protein, ur: 30 mg/dL — AB
Specific Gravity, Urine: 1.032 — ABNORMAL HIGH (ref 1.005–1.030)
WBC, UA: >50 WBC/hpf (ref 0–5)
pH: 5 (ref 5.0–8.0)

## 2024-04-07 MED ORDER — CEPHALEXIN 500 MG PO CAPS
500.0000 mg | ORAL_CAPSULE | Freq: Once | ORAL | Status: AC
  Administered 2024-04-07: 500 mg via ORAL
  Filled 2024-04-07: qty 1

## 2024-04-07 MED ORDER — OXYCODONE-ACETAMINOPHEN 5-325 MG PO TABS: 1.0000 | ORAL_TABLET | Freq: Three times a day (TID) | ORAL | 0 refills | Status: DC | PRN

## 2024-04-07 MED ORDER — CEPHALEXIN 500 MG PO CAPS: 500.0000 mg | ORAL_CAPSULE | Freq: Two times a day (BID) | ORAL | 0 refills | Status: AC

## 2024-04-07 NOTE — ED Provider Triage Note (Signed)
 Emergency Medicine Provider Triage Evaluation Note  Kayla [name] , a 78 y.o. female  was evaluated in triage.  Pt complains of many complaints.  Concern for UTI.  No sign of systemic illness.  Mechanical fall hit head but no loss of consciousness and no blood thinner.  Complains of head pain left hip pain left knee pain right knee pain.  Also has a rash to the bottom of feet.  Review of Systems  Positive: pain Negative: fever  Physical Exam  BP 118/77   Pulse 79   Temp 97.7 F (36.5 C) (Oral)   Resp 18   SpO2 98%  Gen:   Awake, no distress   Resp:  Normal effort  MSK:   Moves extremities without difficulty  Other:    Medical Decision Making  Medically screening exam initiated at 1:53 PM.  Appropriate orders placed.  Kayla [name] was informed that the remainder of the evaluation will be completed by another provider, this initial triage assessment does not replace that evaluation, and the importance of remaining in the ED until their evaluation is complete.     Shermon Warren SAILOR, PA-C 04/07/24 1354

## 2024-04-07 NOTE — ED Provider Notes (Signed)
 " [place] EMERGENCY DEPARTMENT AT [facility] Provider Note   CSN: [id number] Arrival date & time: 04/07/24  1311     Patient presents with: Kayla [name] is a 78 y.o. female.   Patient is here for evaluation after mechanical fall that occurred yesterday evening.  She was pulled down while walking her granddaughter's dog.  Denies loss of consciousness and is not taking a blood thinner.  She fell onto hard ground.  Falling onto her left hip, both knees, and face.  Patient reports pain to the right occipital region of the head, bilateral knees, and para spinal tenderness at the thoracic region.  She had trouble sleeping secondary to this pain.  She also has history of arthritis and neuropathy.  Patient also reports urinary frequency and dysuria.  She took an at home UTI test which was positive.  She is requesting treatment of UTI.  Denies fevers.  The history is provided by the patient.  Fall This is a new problem. The current episode started 12 to 24 hours ago.       Prior to Admission medications  Medication Sig Start Date End Date Taking? Authorizing Provider  cephALEXin  (KEFLEX ) 500 MG capsule Take 1 capsule (500 mg total) by mouth 2 (two) times daily for 7 days. 04/07/24 04/14/24 Yes Rosina Almarie LABOR, PA-C  oxyCODONE -acetaminophen  (PERCOCET/ROXICET) 5-325 MG tablet Take 1 tablet by mouth every 8 (eight) hours as needed for up to 5 doses for severe pain (pain score 7-10). 04/07/24  Yes Rosina Almarie LABOR, PA-C  acetaminophen  (TYLENOL ) 500 MG tablet Take 1,000 mg by mouth in the morning, at noon, in the evening, and at bedtime.    [provider]  amLODipine  (NORVASC ) 5 MG tablet TAKE 1 TABLET (5 MG TOTAL) BY MOUTH DAILY. 11/11/23   Lonni Slain, MD  Azelastine -Fluticasone  137-50 MCG/ACT SUSP Place 1 spray into the nose every 12 (twelve) hours. Patient not taking: Reported on 02/06/2024 07/10/23   Lowne Chase, Yvonne R, DO  Cholecalciferol  (VITAMIN D -3 PO) Take 1 capsule by mouth daily.    [provider]  clotrimazole -betamethasone  (LOTRISONE ) cream APPLY TO AFFECTED AREA 2 TIMES DAILY AS NEEDED 03/22/24   Lowne Chase, Yvonne R, DO  [employer] VITAMIN B12 1000 MCG tablet Take 2,000 mcg by mouth daily.    [provider]  DULoxetine  (CYMBALTA ) 30 MG capsule Take 1 capsule (30 mg total) by mouth at bedtime. 09/17/23   Leigh Venetia CROME, MD  estradiol  (ESTRACE ) 0.1 MG/GM vaginal cream Apply a pea-sized amount of cream to your vaginal opening using your index finger 3 nights a week Patient not taking: Reported on 02/06/2024 09/03/23   Matilda Senior, MD  hydrALAZINE  (APRESOLINE ) 10 MG tablet Take 1 tablet (10 mg total) by mouth 3 (three) times daily as needed (for elevated blood pressure (top number more than 140, bottom number more than 90)). 02/14/23   Lonni Slain, MD  hydrOXYzine  (ATARAX ) 25 MG tablet Take 1 tablet (25 mg total) by mouth every 8 (eight) hours as needed for itching. 03/08/24   Franklyn Sid SAILOR, MD  levothyroxine  (SYNTHROID ) 125 MCG tablet Take 1 tablet (125 mcg total) by mouth daily before breakfast. 02/05/24   Antonio Meth, Yvonne R, DO  MAGNESIUM  GLYCINATE PO Take 1 tablet by mouth daily.    [provider]  methenamine  (HIPREX ) 1 g tablet Take 1 tablet (1 g total) by mouth 2 (two) times daily with a meal. 09/03/23   Matilda Senior,  MD  methocarbamol  (ROBAXIN ) 500 MG tablet Take 1 tablet (500 mg total) by mouth 2 (two) times daily. 02/03/24   Lowne Chase, Yvonne R, DO  nystatin  cream (MYCOSTATIN ) Apply to affected area 2 times daily Patient taking differently: Apply 1 Application topically 2 (two) times daily as needed for dry skin (or irritation- affected areas). 04/27/23   Johnie Flaming A, NP  ondansetron  (ZOFRAN -ODT) 4 MG disintegrating tablet Take 1 tablet (4 mg total) by mouth every 8 (eight) hours as needed. 08/03/23   Griselda Norris, MD  pantoprazole  (PROTONIX ) 40 MG tablet Take 1  tablet (40 mg total) by mouth daily. Patient not taking: Reported on 02/06/2024 07/10/23   Antonio Meth, Yvonne R, DO  Potassium 99 MG TABS Take 99 mg by mouth daily.    [provider]  potassium chloride  SA (KLOR-CON  M) 20 MEQ tablet Take 1 tablet (20 mEq total) by mouth daily. Patient not taking: Reported on 02/06/2024 08/18/23   Antonio Meth Jamee JONELLE, DO    Allergies: Ace inhibitors, Lisinopril, Ciprofloxacin , Fentanyl , and Iohexol     Review of Systems  Musculoskeletal:        Bilateral knee pain    Updated Vital Signs BP (!) 133/90   Pulse 65   Temp 98.8 F (37.1 C) (Oral)   Resp 16   SpO2 99%   Physical Exam Vitals and nursing note reviewed.  Constitutional:      General: She is not in acute distress.    Appearance: Normal appearance. She is not ill-appearing, toxic-appearing or diaphoretic.  HENT:     Head: Normocephalic and atraumatic.     Comments: No obvious injuries or tenderness to the head.    Nose: Nose normal.     Mouth/Throat:     Mouth: Mucous membranes are moist.  Eyes:     General: No scleral icterus.    Extraocular Movements: Extraocular movements intact.     Conjunctiva/sclera: Conjunctivae normal.  Cardiovascular:     Rate and Rhythm: Normal rate and regular rhythm.     Pulses: Normal pulses.     Heart sounds: Normal heart sounds.  Pulmonary:     Effort: Pulmonary effort is normal.  Abdominal:     General: Abdomen is flat. Bowel sounds are normal. There is no distension.     Palpations: Abdomen is soft.     Tenderness: There is no abdominal tenderness. There is no right CVA tenderness, left CVA tenderness or guarding.  Musculoskeletal:        General: Tenderness present. No swelling or deformity. Normal range of motion.     Cervical back: Normal range of motion. No swelling, edema, deformity, rigidity, tenderness or bony tenderness.     Thoracic back: Tenderness present. No swelling, edema, deformity, signs of trauma or bony tenderness.      Lumbar back: No swelling, edema, deformity, tenderness or bony tenderness.     Right knee: No swelling, deformity, erythema, ecchymosis, lacerations or bony tenderness. No tenderness.     Left knee: No swelling, deformity, erythema, ecchymosis, lacerations or bony tenderness. No tenderness.     Right lower leg: No swelling. No edema.     Left lower leg: No swelling. No edema.  Skin:    General: Skin is warm and dry.     Capillary Refill: Capillary refill takes less than 2 seconds.     Coloration: Skin is not jaundiced or pale.  Neurological:     Mental Status: She is alert and oriented to person, place,  and time.     Sensory: No sensory deficit.     Motor: No weakness.     Coordination: Coordination normal.     Gait: Gait normal.     (all labs ordered are listed, but only abnormal results are displayed) Labs Reviewed  URINALYSIS, ROUTINE W REFLEX MICROSCOPIC - Abnormal; Notable for the following components:      Result Value   Color, Urine AMBER (*)    APPearance CLOUDY (*)    Specific Gravity, Urine 1.032 (*)    Hgb urine dipstick SMALL (*)    Bilirubin Urine SMALL (*)    Protein, ur 30 (*)    Leukocytes,Ua MODERATE (*)    Bacteria, UA FEW (*)    All other components within normal limits  URINE CULTURE    EKG: None  Radiology: DG Knee Complete 4 Views Right Result Date: 04/07/2024 EXAM: 4 OR MORE VIEW(S) XRAY OF THE KNEE 04/07/2024 02:48:00 PM COMPARISON: 02/25/2023, 08/23/2023 CLINICAL HISTORY: fall FINDINGS: BONES AND JOINTS: No acute fracture. No malalignment. No significant joint effusion. Moderate medial compartment joint space narrowing and osteophyte formation. Mild patellofemoral compartment osteophyte formation. SOFT TISSUES: The soft tissues are unremarkable. IMPRESSION: 1. No evidence of acute traumatic injury. Electronically signed by: Morgane Naveau MD 04/07/2024 03:19 PM EST RP Workstation: [id number]   DG Knee Complete 4 Views Left Result Date:  04/07/2024 EXAM: 4 VIEW(S) XRAY OF THE LEFT KNEE 04/07/2024 02:48:00 PM COMPARISON: None available. CLINICAL HISTORY: fall FINDINGS: BONES AND JOINTS: No acute fracture. No malalignment. Tricompartment osteophyte formation with moderate to severe medial compartment joint space narrowing consistent with osteoarthritis. SOFT TISSUES: The soft tissues are unremarkable. IMPRESSION: 1. No evidence of acute traumatic injury. Electronically signed by: Morgane Naveau MD 04/07/2024 03:18 PM EST RP Workstation: [id number]   DG Hip Unilat W or Wo Pelvis 2-3 Views Left Result Date: 04/07/2024 EXAM: 2 or more VIEW(S) XRAY OF THE BILATERAL HIP 04/07/2024 02:48:00 PM COMPARISON: Cc a and pelvis 12:12:25 CLINICAL HISTORY: fall FINDINGS: BONES AND JOINTS: No acute fracture or dislocation of either hip. No acute displaced fracture or diastasis of the bones of the pelvis. Mild bilateral hip joint space narrowing and osteophyte formation consistent with osteoarthritis. LUMBAR SPINE: Degenerative changes of the visualized lower lumbar spine. SOFT TISSUES: The soft tissues are unremarkable. IMPRESSION: 1. No acute fracture or dislocation of either hip or pelvis. Electronically signed by: Morgane Naveau MD 04/07/2024 03:17 PM EST RP Workstation: [id number]   CT Cervical Spine Wo Contrast Result Date: 04/07/2024 EXAM: CT CERVICAL SPINE WITHOUT CONTRAST 04/07/2024 02:18:38 PM TECHNIQUE: CT of the cervical spine was performed without the administration of intravenous contrast. Multiplanar reformatted images are provided for review. Automated exposure control, iterative reconstruction, and/or weight based adjustment of the mA/kV was utilized to reduce the radiation dose to as low as reasonably achievable. COMPARISON: None available. CLINICAL HISTORY: Neck trauma (Age >= 65y) FINDINGS: BONES AND ALIGNMENT: Anterior cervical fusion hardware at C6-C7 with solid osseous fusion at this level. There is additional partial fusion of the C3  and C4 vertebral bodies. There are multiple small lucent foci in the cervical spine which may be related to osteopenia although lesions secondary to metastasis or myeloma cannot be excluded. Recommend a nonemergent MRI of the cervical spine with and without contrast for further evaluation. No acute fracture or traumatic malalignment. DEGENERATIVE CHANGES: Facet arthrosis and uncovertebral hypertrophy at multiple levels. Foraminal stenosis most pronounced on the right at C4-C5. No high grade osseous spinal canal stenosis. SOFT TISSUES:  No prevertebral soft tissue swelling. Atherosclerosis at the carotid bifurcations. IMPRESSION: 1. No acute cervical spine fracture or dislocation. 2. Multiple small lucent foci in the cervical spine, possibly related to osteopenia, although lesions secondary to metastasis or myeloma cannot be excluded. Nonemergent MRI of the cervical spine with and without contrast is recommended for further evaluation. 3. Anterior cervical fusion hardware at C6-7. Electronically signed by: Donnice Mania MD 04/07/2024 02:34 PM EST RP Workstation: [id number]   CT Head Wo Contrast Result Date: 04/07/2024 EXAM: CT HEAD WITHOUT CONTRAST 04/07/2024 02:18:38 PM TECHNIQUE: CT of the head was performed without the administration of intravenous contrast. Automated exposure control, iterative reconstruction, and/or weight based adjustment of the mA/kV was utilized to reduce the radiation dose to as low as reasonably achievable. COMPARISON: 08/23/2023 CLINICAL HISTORY: Head trauma, moderate-severe. FINDINGS: BRAIN AND VENTRICLES: No acute hemorrhage. No evidence of acute infarct. No hydrocephalus. No extra-axial collection. No mass effect or midline shift. Trace patchy and confluent areas of decreased attenuation throughout the deep and periventricular white matter of the cerebral hemispheres bilaterally, compatible with chronic microvascular ischemic disease. ORBITS: No acute abnormality. SINUSES: No acute  abnormality. SOFT TISSUES AND SKULL: No acute soft tissue abnormality. No skull fracture. Small right mastoid effusion. VASCULATURE: Atherosclerotic calcifications within the cavernous internal carotid arteries. IMPRESSION: 1. No acute intracranial abnormality. 2. Small right mastoid effusion. Recommend correlation with tenderness over the mastoid temporal bone. Electronically signed by: Donnice Mania MD 04/07/2024 02:29 PM EST RP Workstation: [id number]     Procedures   Medications Ordered in the ED  cephALEXin  (KEFLEX ) capsule 500 mg (500 mg Oral Given 04/07/24 1949)     Patient presents to the ED for concern of pain after mechanical fall and UTI, this involves an extensive number of treatment options, and is a complaint that carries with it a high risk of complications and morbidity.  The differential diagnosis includes fracture, dislocation, soft tissue injury, intracranial hemorrhage, UTI, pyelonephritis, sepsis.  Imaging shows no fractures, dislocations, or intracranial hemorrhage. UA indicative of infection. Physical exam and history not indicative of pyelonephritis. Vitals and history reassuring, lowering suspicion for sepsis.   Co morbidities that complicate the patient evaluation  Hypertension CAD History of aneurysm Osteoarthritis Neuropathy   Additional history obtained:  Additional history obtained from Outside Medical Records   External records from outside source obtained and reviewed including review of previous urine cultures and sensitivities.   Lab Tests:  I Ordered, and personally interpreted labs.  The pertinent results include: Urinalysis indicative of infection.   Imaging Studies ordered:  I ordered imaging studies including::  X-ray right knee: No evidence of acute traumatic injury. X-ray left knee: No evidence of acute traumatic injury. X-ray bilateral hip: No acute fracture or dislocation of either hip or pelvis. CT cervical spine: No acute  cervical spine fracture or dislocation.  Incidental finding of multiple small lucent foci in the cervical spine, possibly related to osteopenia, other lesions secondary to metastasis or myeloma cannot be excluded.  Nonemergent MRI of the cervical spine with and without contrast is recommended for further evaluation. CT head: No acute intracranial abnormality.  Small right mastoid effusion.   Medicines ordered and prescription drug management:  I ordered medication including keflex   for UTI    Problem List / ED Course:     Pain after mechanical fall. Physical exam and vitals are reassuring. Imaging shows no acute findings.  UTI. UA indicative of infection. Patient given one dose of antibiotic in ED today as the  patient's pharmacy is closed for the night. Return precautions discuss and patient verbalized understanding. Stable for discharge.  Incidental imaging findings. Patient made aware of incidental imaging finding and recommended to follow up with primary care or established neurologist for nonemergent MRI of the cervical spine with and without contrast as recommended for further evaluation.   Reevaluation:  After the interventions noted above, I reevaluated the patient and found that they have :improved   Dispostion:  After consideration of the diagnostic results and the patients response to treatment, I feel that the patent would benefit from supportive care in the home setting with antibiotic prescription and short-course of pain medication sent to pharmacy. Follow up with primary care for ongoing evaluation and management. Return precautions given.    Medical Decision Making  This note was produced using Dragon Medical voice recognition. While the provider has reviewed and verified all clinical information, transcription errors may remain.    Final diagnoses:  Fall, initial encounter  Acute cystitis without hematuria    ED Discharge Orders          Ordered    cephALEXin   (KEFLEX ) 500 MG capsule  2 times daily        04/07/24 1931    oxyCODONE -acetaminophen  (PERCOCET/ROXICET) 5-325 MG tablet  Every 8 hours PRN        04/07/24 1937               Patriciaann Rabanal A, PA-C 04/07/24 2048  "

## 2024-04-07 NOTE — Discharge Instructions (Addendum)
 It was a pleasure meeting with you today.  As we discussed there were no acute findings on your imaging of the right and left knee, hips, cervical spine, or head.  I have sent short course of pain medication to your pharmacy. Be careful when taking this medication as you may be at risk of additional falls. Do not drink alcohol while taking this medication. If you continue to have pain, Please use Tylenol  or ibuprofen  for pain.  You may use 600 mg ibuprofen  every 6 hours or 1000 mg of Tylenol  every 6 hours.  You may choose to alternate between the 2.  This would be most effective.  Not to exceed 4 g of Tylenol  within 24 hours.  Not to exceed 3200 mg ibuprofen  24 hours.  Urinalysis indicates current infection.  We have given you 1 dose of oral medication today in the ED and I have sent prescription antibiotic to your pharmacy.  On your imaging today, there was an incidental finding of multiple small lucent foci in the cervical spine that is possibly related to osteopenia.  Recommendation for nonemergent MRI of the cervical spine with and without contrast with your primary care provider or established neurologist.  Please return for further evaluation if pain worsens, fever develops, or any other new or concerning symptoms develop.

## 2024-04-07 NOTE — ED Triage Notes (Signed)
 Patient states she was holding leash for her granddaughters dog and he pulled her. States she fell on her left hip, then right foot and then hit her right side of face/head. Happened yesterday per patient. Hx of bleed on brain from previous falls. Denies any blood thinner.

## 2024-04-10 LAB — URINE CULTURE: Culture: 70000 — AB

## 2024-04-11 ENCOUNTER — Telehealth (HOSPITAL_BASED_OUTPATIENT_CLINIC_OR_DEPARTMENT_OTHER): Payer: Self-pay | Admitting: *Deleted

## 2024-04-11 NOTE — Telephone Encounter (Signed)
 Post ED Visit - Positive Culture Follow-up  Culture report reviewed by antimicrobial stewardship pharmacist: [facility] []  Rankin Dee, Pharm.D. []  Venetia Gully, Pharm.D., BCPS AQ-ID []  Garrel Crews, Pharm.D., BCPS []  Almarie Lunger, Pharm.D., BCPS []  [place], [address].D., BCPS, AAHIVP []  Rosaline Bihari, Pharm.D., BCPS, AAHIVP []  Vernell Meier, PharmD, BCPS []  Latanya Hint, PharmD, BCPS []  Donald Medley, PharmD, BCPS []  Rocky Bold, PharmD []  Dorothyann Alert, PharmD, BCPS []  Morene Babe, PharmD  [facility] []  Rosaline Edison, PharmD []  Romona Bliss, PharmD []  Dolphus Roller, PharmD []  Veva Seip, Rph []  Vernell Daunt) Leonce, PharmD []  Eva Allis, PharmD []  Rosaline Millet, PharmD []  Iantha Batch, PharmD []  Arvin Gauss, PharmD []  Wanda Hasting, PharmD [x]  Ronal Rav, PharmD []  Rocky Slade, PharmD []  Bard Jeans, PharmD   Positive urine culture Treated with Cephalexin , organism sensitive to the same and no further patient follow-up is required at this time.  Kayla [name] 04/11/2024, 12:37 PM

## 2024-04-12 ENCOUNTER — Other Ambulatory Visit: Payer: Self-pay | Admitting: Family Medicine

## 2024-04-12 ENCOUNTER — Telehealth (HOSPITAL_COMMUNITY): Payer: Self-pay | Admitting: Family

## 2024-04-12 NOTE — Telephone Encounter (Signed)
 Patient called and cancelled echocardiogram x 2 and will call back to reschedule. Order will be removed from the active echo WQ. When patient calls back we will reinstate the order. Thank you so much!

## 2024-04-13 ENCOUNTER — Ambulatory Visit (HOSPITAL_COMMUNITY)

## 2024-04-15 ENCOUNTER — Other Ambulatory Visit (HOSPITAL_BASED_OUTPATIENT_CLINIC_OR_DEPARTMENT_OTHER): Payer: Self-pay | Admitting: Cardiology

## 2024-04-15 DIAGNOSIS — I10 Essential (primary) hypertension: Secondary | ICD-10-CM

## 2024-04-30 ENCOUNTER — Ambulatory Visit: Admitting: Family Medicine

## 2024-04-30 ENCOUNTER — Encounter: Payer: Self-pay | Admitting: Family Medicine

## 2024-04-30 VITALS — BP 112/76 | HR 77 | Temp 98.0°F | Resp 14 | Ht 63.0 in | Wt 175.8 lb

## 2024-04-30 DIAGNOSIS — H547 Unspecified visual loss: Secondary | ICD-10-CM

## 2024-04-30 DIAGNOSIS — I2510 Atherosclerotic heart disease of native coronary artery without angina pectoris: Secondary | ICD-10-CM

## 2024-04-30 DIAGNOSIS — L84 Corns and callosities: Secondary | ICD-10-CM

## 2024-04-30 DIAGNOSIS — E039 Hypothyroidism, unspecified: Secondary | ICD-10-CM

## 2024-04-30 DIAGNOSIS — M2550 Pain in unspecified joint: Secondary | ICD-10-CM

## 2024-04-30 DIAGNOSIS — R739 Hyperglycemia, unspecified: Secondary | ICD-10-CM

## 2024-04-30 DIAGNOSIS — I10 Essential (primary) hypertension: Secondary | ICD-10-CM

## 2024-04-30 DIAGNOSIS — R300 Dysuria: Secondary | ICD-10-CM

## 2024-04-30 DIAGNOSIS — E559 Vitamin D deficiency, unspecified: Secondary | ICD-10-CM

## 2024-04-30 DIAGNOSIS — N289 Disorder of kidney and ureter, unspecified: Secondary | ICD-10-CM

## 2024-04-30 DIAGNOSIS — M791 Myalgia, unspecified site: Secondary | ICD-10-CM

## 2024-04-30 DIAGNOSIS — E782 Mixed hyperlipidemia: Secondary | ICD-10-CM

## 2024-04-30 DIAGNOSIS — N390 Urinary tract infection, site not specified: Secondary | ICD-10-CM

## 2024-04-30 DIAGNOSIS — I729 Aneurysm of unspecified site: Secondary | ICD-10-CM

## 2024-04-30 DIAGNOSIS — E538 Deficiency of other specified B group vitamins: Secondary | ICD-10-CM

## 2024-04-30 DIAGNOSIS — G629 Polyneuropathy, unspecified: Secondary | ICD-10-CM

## 2024-04-30 LAB — COMPREHENSIVE METABOLIC PANEL WITH GFR
ALT: 9 U/L (ref 3–35)
AST: 14 U/L (ref 5–37)
Albumin: 4.3 g/dL (ref 3.5–5.2)
Alkaline Phosphatase: 73 U/L (ref 39–117)
BUN: 11 mg/dL (ref 6–23)
CO2: 27 meq/L (ref 19–32)
Calcium: 9.6 mg/dL (ref 8.4–10.5)
Chloride: 106 meq/L (ref 96–112)
Creatinine, Ser: 0.88 mg/dL (ref 0.40–1.20)
GFR: 62.72 mL/min
Glucose, Bld: 100 mg/dL — ABNORMAL HIGH (ref 70–99)
Potassium: 4.1 meq/L (ref 3.5–5.1)
Sodium: 141 meq/L (ref 135–145)
Total Bilirubin: 0.4 mg/dL (ref 0.2–1.2)
Total Protein: 6.8 g/dL (ref 6.0–8.3)

## 2024-04-30 LAB — POC URINALSYSI DIPSTICK (AUTOMATED)
Blood, UA: NEGATIVE
Glucose, UA: NEGATIVE
Ketones, UA: NEGATIVE
Nitrite, UA: NEGATIVE
Protein, UA: POSITIVE — AB
Spec Grav, UA: 1.01
Urobilinogen, UA: 0.2 U/dL
pH, UA: 6

## 2024-04-30 LAB — CBC WITH DIFFERENTIAL/PLATELET
Basophils Absolute: 0.1 K/uL (ref 0.0–0.1)
Basophils Relative: 0.7 % (ref 0.0–3.0)
Eosinophils Absolute: 0.2 K/uL (ref 0.0–0.7)
Eosinophils Relative: 2.2 % (ref 0.0–5.0)
HCT: 43.3 % (ref 36.0–46.0)
Hemoglobin: 14.6 g/dL (ref 12.0–15.0)
Lymphocytes Relative: 24.5 % (ref 12.0–46.0)
Lymphs Abs: 1.9 K/uL (ref 0.7–4.0)
MCHC: 33.7 g/dL (ref 30.0–36.0)
MCV: 97.3 fl (ref 78.0–100.0)
Monocytes Absolute: 0.6 K/uL (ref 0.1–1.0)
Monocytes Relative: 7.6 % (ref 3.0–12.0)
Neutro Abs: 4.9 K/uL (ref 1.4–7.7)
Neutrophils Relative %: 65 % (ref 43.0–77.0)
Platelets: 263 K/uL (ref 150.0–400.0)
RBC: 4.45 Mil/uL (ref 3.87–5.11)
RDW: 12.9 % (ref 11.5–15.5)
WBC: 7.6 K/uL (ref 4.0–10.5)

## 2024-04-30 LAB — HIGH SENSITIVITY CRP: CRP, High Sensitivity: 2.05 mg/L (ref 0.200–5.000)

## 2024-04-30 LAB — LIPID PANEL
Cholesterol: 213 mg/dL — ABNORMAL HIGH (ref 28–200)
HDL: 65.5 mg/dL
LDL Cholesterol: 130 mg/dL — ABNORMAL HIGH (ref 10–99)
NonHDL: 147.96
Total CHOL/HDL Ratio: 3
Triglycerides: 90 mg/dL (ref 10.0–149.0)
VLDL: 18 mg/dL (ref 0.0–40.0)

## 2024-04-30 LAB — HEMOGLOBIN A1C: Hgb A1c MFr Bld: 5.1 % (ref 4.6–6.5)

## 2024-04-30 LAB — VITAMIN D 25 HYDROXY (VIT D DEFICIENCY, FRACTURES): VITD: 38.58 ng/mL (ref 30.00–100.00)

## 2024-04-30 LAB — VITAMIN B12: Vitamin B-12: 987 pg/mL — ABNORMAL HIGH (ref 211–911)

## 2024-04-30 LAB — MAGNESIUM: Magnesium: 2.1 mg/dL (ref 1.5–2.5)

## 2024-04-30 MED ORDER — CEPHALEXIN 500 MG PO CAPS: 500.0000 mg | ORAL_CAPSULE | Freq: Two times a day (BID) | ORAL | 0 refills | Status: DC

## 2024-04-30 NOTE — Progress Notes (Signed)
 +  Subjective:    Patient ID: Kayla [name], female    DOB: [birth year], 79 y.o.   MRN: [id number]  Chief Complaint  Patient presents with   Follow-up    Patient is here for a follow up with the provider. She said she feels she has pressure in her brain and has an aneurysm.    Dysuria    Patient believes she has an UTI -She has burning and pain while urinating     HPI Patient is in today for f/u.  Discussed the use of AI scribe software for clinical note transcription with the patient, who gave verbal consent to proceed.  History of Present Illness Kayla [name] is a 79 year old female who presents with worsening hearing loss and concerns about diabetes.  She has experienced a significant worsening of her hearing, particularly in the past week, with her ear popping and feeling closed up. Her ear has been checked previously, but the issue has worsened recently.  She is concerned about potentially having diabetes, as she feels weak, tired, and has gained weight. She experiences a constant feeling of hunger, even after eating, and feels like she might pass out. Her family, including medical professionals, have suggested she be tested for diabetes. She mentions that her blood sugar levels were normal during previous tests, but she feels that her condition has changed since then.  She has a history of recurrent urinary tract infections (UTIs), occurring approximately every other month, and is currently experiencing pressure and burning sensations. She was previously prescribed methenamine  by a urologist, but she continues to experience recurrent UTIs.  She reports a history of a small brain aneurysm and has experienced pressure and headaches, with her head and neck feeling stiff and tender. She describes difficulty moving her neck and pain when brushing her hair. She has had several CT scans.  She reports vision problems, including a sensation of sand in her eyes and a decline in vision despite getting  new glasses last year. She has not yet scheduled a follow-up with her eye doctor.  She experiences chronic pain due to arthritis, fibromyalgia, and neuropathy, which affects her daily life. She uses Tylenol  frequently to manage her pain, despite concerns about liver damage.  She describes symptoms of dehydration, such as dry skin and difficulty staying hydrated despite drinking fluids and using lotions. She also mentions feeling cold all the time, with her hands being 'like ice cubes'.  She has a family history of autoimmune conditions, with her daughter recently diagnosed with Sjogren's syndrome. Her cousin, a retired engineer, civil (consulting), suspects she might have Hashimoto's disease.    Past Medical History:  Diagnosis Date   Anemia    Anxiety    Asthma    B12 deficiency    Bursitis    Chronic LBP    Chronic UTI    Diastolic dysfunction    Fibromyalgia    GERD (gastroesophageal reflux disease)    Hearing impaired person, bilateral    per pt   History of low potassium    Hyperlipemia    Hypertension    Hypothyroid    IBS (irritable bowel syndrome)    Inflammatory osteoarthritis    Kidney infection    Peripheral vascular disease    Psoriatic arthritis (HCC)    S/P cardiac cath 11/30/2007   normal coronaries - Dr. Wonda (Dr. Mona reviewed films on 01/10/2016)    Past Surgical History:  Procedure Laterality Date   CESAREAN SECTION  06/10/1975  CHOLECYSTECTOMY     LUMBAR LAMINECTOMY/DECOMPRESSION MICRODISCECTOMY Right 05/25/2020   Procedure: MICRODISCECTOMY Right Lumbar three - four;  Surgeon: Mavis Purchase, MD;  Location: [facility] OR;  Service: Neurosurgery;  Laterality: Right;   NECK SURGERY  1998   RADIOLOGY WITH ANESTHESIA N/A 05/16/2020   Procedure: MRI LUMBAR SPINE W/CONTRAST;  Surgeon: Radiologist, Medication, MD;  Location: [facility] OR;  Service: Radiology;  Laterality: N/A;   repair broken C6 & C7      Family History  Problem Relation Age of Onset   Cancer Mother        oral   Stroke  Mother    Alzheimer's disease Mother        at 89   Heart attack Father 79   Coronary artery disease Brother    Heart attack Brother    Brain cancer Maternal Grandmother    Congestive Heart Failure Maternal Grandfather    Heart disease Maternal Grandfather    Heart attack Paternal Grandfather    Cancer Maternal Aunt        breast   Alcohol abuse Maternal Aunt    Alcohol abuse Maternal Uncle    Cancer Maternal Uncle        lung, stomach, oral   Cancer Paternal Uncle        lung   Cancer Paternal Uncle        bone   Lung cancer Other        uncle, non smoker   Leukemia Other        uncle   Alzheimer's disease Other        maternal great grandmother    Colon cancer Neg Hx    Breast cancer Neg Hx    Diabetes Neg Hx     Social History   Socioeconomic History   Marital status: Widowed    Spouse name: Not on file   Number of children: 2   Years of education: Not on file   Highest education level: Bachelor's degree (e.g., BA, AB, BS)  Occupational History   Not on file  Tobacco Use   Smoking status: Never    Passive exposure: Never   Smokeless tobacco: Never  Vaping Use   Vaping status: Never Used  Substance and Sexual Activity   Alcohol use: Never   Drug use: No   Sexual activity: Not Currently    Birth control/protection: Post-menopausal  Other Topics Concern   Not on file  Social History Narrative   Lost husband 2009, lost mother 2016      Lives at home (son & 3 grandchildren live with her)   Caffeine: 10-12 oz daily   Social Drivers of Health   Tobacco Use: Low Risk (02/13/2024)   Patient History    Smoking Tobacco Use: Never    Smokeless Tobacco Use: Never    Passive Exposure: Never  Financial Resource Strain: Not on file  Food Insecurity: Not on file  Transportation Needs: Not on file  Physical Activity: Not on file  Stress: Not on file  Social Connections: Not on file  Intimate Partner Violence: Not on file  Depression (EYV7-0): Not on file   Alcohol Screen: Not on file  Housing: Not on file  Utilities: Not on file  Health Literacy: Not on file    Outpatient Medications Prior to Visit  Medication Sig Dispense Refill   acetaminophen  (TYLENOL ) 500 MG tablet Take 1,000 mg by mouth in the morning, at noon, in the evening, and at bedtime.  amLODipine  (NORVASC ) 5 MG tablet TAKE 1 TABLET (5 MG TOTAL) BY MOUTH DAILY. 90 tablet 3   Azelastine -Fluticasone  137-50 MCG/ACT SUSP Place 1 spray into the nose every 12 (twelve) hours. (Patient not taking: Reported on 02/06/2024) 23 g 5   Cholecalciferol (VITAMIN D -3 PO) Take 1 capsule by mouth daily.     clotrimazole -betamethasone  (LOTRISONE ) cream APPLY TO AFFECTED AREA 2 TIMES DAILY AS NEEDED 15 g 1   [employer] VITAMIN B12 1000 MCG tablet Take 2,000 mcg by mouth daily.     DULoxetine  (CYMBALTA ) 30 MG capsule Take 1 capsule (30 mg total) by mouth at bedtime. 30 capsule 5   estradiol  (ESTRACE ) 0.1 MG/GM vaginal cream Apply a pea-sized amount of cream to your vaginal opening using your index finger 3 nights a week (Patient not taking: Reported on 02/06/2024) 42.5 g 3   hydrALAZINE  (APRESOLINE ) 10 MG tablet TAKE 1 TABLET BY MOUTH 3 TIMES DAILY AS NEEDED (FOR ELEVATED BLOOD PRESSURE (TOP NUMBER MORE THAN 140, BOTTOM NUMBER MORE THAN 90) 90 tablet 3   hydrOXYzine  (ATARAX ) 25 MG tablet Take 1 tablet (25 mg total) by mouth every 8 (eight) hours as needed for itching. 12 tablet 0   levothyroxine  (SYNTHROID ) 125 MCG tablet TAKE 1 TABLET BY MOUTH DAILY BEFORE BREAKFAST. 90 tablet 1   MAGNESIUM  GLYCINATE PO Take 1 tablet by mouth daily.     methenamine  (HIPREX ) 1 g tablet Take 1 tablet (1 g total) by mouth 2 (two) times daily with a meal. 60 tablet 11   methocarbamol  (ROBAXIN ) 500 MG tablet Take 1 tablet (500 mg total) by mouth 2 (two) times daily. 45 tablet 0   nystatin  cream (MYCOSTATIN ) Apply to affected area 2 times daily (Patient taking differently: Apply 1 Application topically 2 (two) times daily as  needed for dry skin (or irritation- affected areas).) 30 g 0   ondansetron  (ZOFRAN -ODT) 4 MG disintegrating tablet Take 1 tablet (4 mg total) by mouth every 8 (eight) hours as needed. 12 tablet 0   oxyCODONE -acetaminophen  (PERCOCET/ROXICET) 5-325 MG tablet Take 1 tablet by mouth every 8 (eight) hours as needed for up to 5 doses for severe pain (pain score 7-10). 5 tablet 0   pantoprazole  (PROTONIX ) 40 MG tablet Take 1 tablet (40 mg total) by mouth daily. (Patient not taking: Reported on 02/06/2024) 30 tablet 3   Potassium 99 MG TABS Take 99 mg by mouth daily.     potassium chloride  SA (KLOR-CON  M) 20 MEQ tablet Take 1 tablet (20 mEq total) by mouth daily. (Patient not taking: Reported on 02/06/2024) 10 tablet 2   No facility-administered medications prior to visit.    Allergies  Allergen Reactions   Ace Inhibitors Anaphylaxis and Other (See Comments)    Glossal edema   Lisinopril Anaphylaxis   Ciprofloxacin  Other (See Comments)    Told by Rheumatologist not to take it d/t to RA   Fentanyl  Swelling and Other (See Comments)    Tongue swelling POSSIBLY   Iohexol  Swelling and Other (See Comments)    Tongue swelling POSSIBLY    Review of Systems  Constitutional:  Negative for chills, fever and malaise/fatigue.  HENT:  Negative for congestion and hearing loss.   Eyes:  Negative for discharge.  Respiratory:  Negative for cough, sputum production and shortness of breath.   Cardiovascular:  Negative for chest pain, palpitations and leg swelling.  Gastrointestinal:  Negative for abdominal pain, blood in stool, constipation, diarrhea, heartburn, nausea and vomiting.  Genitourinary:  Negative for dysuria, frequency,  hematuria and urgency.  Musculoskeletal:  Negative for back pain, falls and myalgias.  Skin:  Negative for rash.  Neurological:  Negative for dizziness, sensory change, loss of consciousness, weakness and headaches.  Endo/Heme/Allergies:  Negative for environmental allergies. Does  not bruise/bleed easily.  Psychiatric/Behavioral:  Negative for depression and suicidal ideas. The patient is not nervous/anxious and does not have insomnia.        Objective:    Physical Exam Vitals and nursing note reviewed.  Constitutional:      General: She is not in acute distress.    Appearance: Normal appearance. She is well-developed.  HENT:     Head: Normocephalic and atraumatic.  Eyes:     General: No scleral icterus.       Right eye: No discharge.        Left eye: No discharge.  Cardiovascular:     Rate and Rhythm: Normal rate and regular rhythm.     Heart sounds: No murmur heard. Pulmonary:     Effort: Pulmonary effort is normal. No respiratory distress.     Breath sounds: Normal breath sounds.  Musculoskeletal:        General: Normal range of motion.     Cervical back: Normal range of motion and neck supple.     Right lower leg: No edema.     Left lower leg: No edema.  Skin:    General: Skin is warm and dry.  Neurological:     Mental Status: She is alert and oriented to person, place, and time.  Psychiatric:        Mood and Affect: Mood normal.        Behavior: Behavior normal.        Thought Content: Thought content normal.        Judgment: Judgment normal.     BP 112/76 (BP Location: Left Arm, Patient Position: Sitting, Cuff Size: Normal)   Pulse 77   Temp 98 F (36.7 C) (Oral)   Resp 14   Ht 5' 3 (1.6 m)   Wt 175 lb 12.8 oz (79.7 kg)   SpO2 98%   BMI 31.14 kg/m  Wt Readings from Last 3 Encounters:  04/30/24 175 lb 12.8 oz (79.7 kg)  02/13/24 164 lb (74.4 kg)  02/06/24 164 lb 8 oz (74.6 kg)    Diabetic Foot Exam - Simple   No data filed    Lab Results  Component Value Date   WBC 10.0 03/08/2024   HGB 14.6 03/08/2024   HCT 45.4 03/08/2024   PLT 288 03/08/2024   GLUCOSE 105 (H) 03/08/2024   CHOL 186 02/03/2024   TRIG 112.0 02/03/2024   HDL 60.60 02/03/2024   LDLDIRECT 144 (H) 10/22/2022   LDLCALC 103 (H) 02/03/2024   ALT 8  03/08/2024   AST 16 03/08/2024   NA 143 03/08/2024   K 3.9 03/08/2024   CL 107 03/08/2024   CREATININE 0.77 03/08/2024   BUN 16 03/08/2024   CO2 26 03/08/2024   TSH 0.15 (L) 02/03/2024   TSH 0.15 (L) 02/03/2024   INR 1.1 10/12/2018   HGBA1C 5.2 02/03/2024   MICROALBUR 0.7 02/03/2024    Lab Results  Component Value Date   TSH 0.15 (L) 02/03/2024   TSH 0.15 (L) 02/03/2024   Lab Results  Component Value Date   WBC 10.0 03/08/2024   HGB 14.6 03/08/2024   HCT 45.4 03/08/2024   MCV 100.7 (H) 03/08/2024   PLT 288 03/08/2024   Lab Results  Component Value Date   NA 143 03/08/2024   K 3.9 03/08/2024   CO2 26 03/08/2024   GLUCOSE 105 (H) 03/08/2024   BUN 16 03/08/2024   CREATININE 0.77 03/08/2024   BILITOT 0.3 03/08/2024   ALKPHOS 84 03/08/2024   AST 16 03/08/2024   ALT 8 03/08/2024   PROT 7.5 03/08/2024   ALBUMIN 4.5 03/08/2024   CALCIUM  10.2 03/08/2024   ANIONGAP 10 03/08/2024   EGFR 72 03/17/2023   GFR 72.61 02/03/2024   Lab Results  Component Value Date   CHOL 186 02/03/2024   Lab Results  Component Value Date   HDL 60.60 02/03/2024   Lab Results  Component Value Date   LDLCALC 103 (H) 02/03/2024   Lab Results  Component Value Date   TRIG 112.0 02/03/2024   Lab Results  Component Value Date   CHOLHDL 3 02/03/2024   Lab Results  Component Value Date   HGBA1C 5.2 02/03/2024       Assessment & Plan:  Dysuria -     POCT Urinalysis Dipstick (Automated) -     Urine Culture -     Cephalexin ; Take 1 capsule (500 mg total) by mouth 2 (two) times daily.  Dispense: 10 capsule; Refill: 0  Hypothyroidism, unspecified type Assessment & Plan: Check labs   Orders: -     Thyroid  Panel With TSH  Coronary artery disease involving native heart without angina pectoris, unspecified vessel or lesion type -     Comprehensive metabolic panel with GFR -     Lipid panel  Essential (primary) hypertension -     CBC with Differential/Platelet  Mixed  hyperlipidemia Assessment & Plan: Encourage heart healthy diet such as MIND or DASH diet, increase exercise, avoid trans fats, simple carbohydrates and processed foods, consider a krill or fish or flaxseed oil cap daily.    Orders: -     Comprehensive metabolic panel with GFR -     Lipid panel  Aneurysm Assessment & Plan: Pt referred to neurosurgery but would like to see Dr Malcolm   Orders: -     Ambulatory referral to Neurosurgery  Vitamin D  deficiency -     VITAMIN D  25 Hydroxy (Vit-D Deficiency, Fractures)  Vitamin B12 deficiency -     Vitamin B12  Hypomagnesemia Assessment & Plan: Check mag   Orders: -     Magnesium   Hyperglycemia -     Hemoglobin A1c  Decreased vision -     Ambulatory referral to Ophthalmology  Neuropathy -     Ambulatory referral to Podiatry  Pre-ulcerative corn or callous -     Ambulatory referral to Podiatry  Myalgia -     ANA -     High sensitivity CRP -     Rheumatoid factor  Arthralgia, unspecified joint -     ANA -     High sensitivity CRP -     Rheumatoid factor  Renal insufficiency Assessment & Plan: Check labs    Recurrent UTI Assessment & Plan: Urine culture pending    Essential hypertension Assessment & Plan: Well controlled, no changes to meds. Encouraged heart healthy diet such as the DASH diet and exercise as tolerated.     Assessment and Plan Assessment & Plan Urinary tract infection   Recurrent UTIs with current symptoms of dysuria and pressure. Previous treatment with methenamine  was ineffective. Sent urine for culture and prescribed antibiotics for symptomatic relief.  Hypothyroidism   Symptoms suggestive of hypothyroidism, including fatigue, weight gain,  and cold intolerance. Ordered thyroid  function tests.  Cerebral aneurysm   Small cerebral aneurysm with symptoms of headache, neck stiffness, and facial tenderness. Previous CT scans did not reveal swelling. Symptoms have worsened since last imaging.  Referred to neurosurgeon for MRI and to Dr. Malcolm for further evaluation.  Hypertension   Blood pressure well-controlled today, possibly due to pain management.  Polyneuropathy   Chronic neuropathy with significant foot pain and dry skin. Previous cellulitis with nerve damage. Difficulty wearing regular shoes due to pain. Referred to podiatrist for foot care and dermatologist for skin care.  Arthritis and fibromyalgia   Chronic pain from arthritis and fibromyalgia. Reports significant pain and stiffness, particularly in the neck and head area.  Decreased vision   Reports decreased vision and sensation of sand in eyes. Recent eye examination noted subtle eye misalignment. Referred to eye doctor for further evaluation.  Hyperglycemia   Concerns about potential diabetes due to family history and symptoms of fatigue and weight gain. Previous blood sugar levels were normal. Ordered blood glucose and A1c tests.    Mathhew Buysse R Lowne Chase, DO

## 2024-04-30 NOTE — Assessment & Plan Note (Signed)
 Check labs

## 2024-04-30 NOTE — Assessment & Plan Note (Signed)
 Encourage heart healthy diet such as MIND or DASH diet, increase exercise, avoid trans fats, simple carbohydrates and processed foods, consider a krill or fish or flaxseed oil cap daily.

## 2024-04-30 NOTE — Assessment & Plan Note (Signed)
 Urine culture pending. Kayla [name]

## 2024-04-30 NOTE — Assessment & Plan Note (Signed)
 Pt referred to neurosurgery but would like to see Dr Malcolm

## 2024-04-30 NOTE — Assessment & Plan Note (Signed)
 Well controlled, no changes to meds. Encouraged heart healthy diet such as the DASH diet and exercise as tolerated.

## 2024-04-30 NOTE — Assessment & Plan Note (Signed)
Check mag

## 2024-05-02 LAB — URINE CULTURE
MICRO NUMBER:: 17508300
SPECIMEN QUALITY:: ADEQUATE

## 2024-05-04 ENCOUNTER — Ambulatory Visit: Payer: Self-pay | Admitting: Family Medicine

## 2024-05-04 DIAGNOSIS — R7689 Other specified abnormal immunological findings in serum: Secondary | ICD-10-CM

## 2024-05-04 LAB — ANA: Anti Nuclear Antibody (ANA): POSITIVE — AB

## 2024-05-04 LAB — THYROID PANEL WITH TSH
Free Thyroxine Index: 2.6 (ref 1.4–3.8)
T3 Uptake: 30 % (ref 22–35)
T4, Total: 8.6 ug/dL (ref 5.1–11.9)
TSH: 8.35 m[IU]/L — ABNORMAL HIGH (ref 0.40–4.50)

## 2024-05-04 LAB — RHEUMATOID FACTOR: Rheumatoid fact SerPl-aCnc: <10 [IU]/mL

## 2024-05-04 LAB — ANTI-NUCLEAR AB-TITER (ANA TITER): ANA Titer 1: 1:160 {titer} — ABNORMAL HIGH

## 2024-05-05 ENCOUNTER — Other Ambulatory Visit: Payer: Self-pay

## 2024-05-05 MED ORDER — LEVOTHYROXINE SODIUM 137 MCG PO TABS: 137.0000 ug | ORAL_TABLET | Freq: Every day | ORAL | 2 refills | Status: AC

## 2024-05-05 MED ORDER — NITROFURANTOIN MONOHYD MACRO 100 MG PO CAPS: 100.0000 mg | ORAL_CAPSULE | Freq: Two times a day (BID) | ORAL | 0 refills | Status: AC

## 2024-05-14 ENCOUNTER — Emergency Department (HOSPITAL_COMMUNITY)
Admission: EM | Admit: 2024-05-14 | Discharge: 2024-05-14 | Disposition: A | Discharge status: Home / Self Care | Source: Home / Self Care | Attending: Emergency Medicine | Admitting: Emergency Medicine

## 2024-05-14 ENCOUNTER — Emergency Department (HOSPITAL_COMMUNITY)

## 2024-05-14 DIAGNOSIS — K219 Gastro-esophageal reflux disease without esophagitis: Secondary | ICD-10-CM

## 2024-05-14 DIAGNOSIS — K8500 Idiopathic acute pancreatitis without necrosis or infection: Secondary | ICD-10-CM

## 2024-05-14 DIAGNOSIS — R0789 Other chest pain: Secondary | ICD-10-CM

## 2024-05-14 LAB — BASIC METABOLIC PANEL WITH GFR
Anion gap: 11 (ref 5–15)
BUN: 15 mg/dL (ref 8–23)
CO2: 24 mmol/L (ref 22–32)
Calcium: 10.2 mg/dL (ref 8.9–10.3)
Chloride: 107 mmol/L (ref 98–111)
Creatinine, Ser: 0.85 mg/dL (ref 0.44–1.00)
GFR, Estimated: >60 mL/min
Glucose, Bld: 80 mg/dL (ref 70–99)
Potassium: 3.7 mmol/L (ref 3.5–5.1)
Sodium: 142 mmol/L (ref 135–145)

## 2024-05-14 LAB — URINALYSIS, ROUTINE W REFLEX MICROSCOPIC
Bilirubin Urine: NEGATIVE
Glucose, UA: NEGATIVE mg/dL
Ketones, ur: NEGATIVE mg/dL
Leukocytes,Ua: NEGATIVE
Nitrite: NEGATIVE
Protein, ur: NEGATIVE mg/dL
Specific Gravity, Urine: 1.008 (ref 1.005–1.030)
pH: 6 (ref 5.0–8.0)

## 2024-05-14 LAB — CBC
HCT: 45.9 % (ref 36.0–46.0)
Hemoglobin: 15.1 g/dL — ABNORMAL HIGH (ref 12.0–15.0)
MCH: 32.5 pg (ref 26.0–34.0)
MCHC: 32.9 g/dL (ref 30.0–36.0)
MCV: 98.9 fL (ref 80.0–100.0)
Platelets: 293 10*3/uL (ref 150–400)
RBC: 4.64 MIL/uL (ref 3.87–5.11)
RDW: 12.2 % (ref 11.5–15.5)
WBC: 8 10*3/uL (ref 4.0–10.5)
nRBC: 0 % (ref 0.0–0.2)

## 2024-05-14 LAB — TROPONIN T, HIGH SENSITIVITY: Troponin T High Sensitivity: 11 ng/L (ref 0–19)

## 2024-05-14 LAB — LIPASE, BLOOD: Lipase: 62 U/L — ABNORMAL HIGH (ref 11–51)

## 2024-05-14 MED ORDER — FAMOTIDINE IN NACL 20-0.9 MG/50ML-% IV SOLN
20.0000 mg | Freq: Once | INTRAVENOUS | Status: AC
  Administered 2024-05-14: 20 mg via INTRAVENOUS
  Filled 2024-05-14: qty 50

## 2024-05-14 MED ORDER — FAMOTIDINE 20 MG PO TABS: 20.0000 mg | ORAL_TABLET | Freq: Two times a day (BID) | ORAL | 0 refills | Status: AC

## 2024-05-14 MED ORDER — SUCRALFATE 1 G PO TABS: 1.0000 g | ORAL_TABLET | Freq: Three times a day (TID) | ORAL | 0 refills | Status: AC

## 2024-05-14 MED ORDER — ONDANSETRON 4 MG PO TBDP: 4.0000 mg | ORAL_TABLET | Freq: Three times a day (TID) | ORAL | 0 refills | Status: AC | PRN

## 2024-05-14 MED ORDER — MORPHINE SULFATE (PF) 4 MG/ML IV SOLN
4.0000 mg | Freq: Once | INTRAVENOUS | Status: DC
  Filled 2024-05-14: qty 1

## 2024-05-14 MED ORDER — SUCRALFATE 1 GM/10ML PO SUSP
1.0000 g | Freq: Once | ORAL | Status: AC
  Administered 2024-05-14: 1 g via ORAL
  Filled 2024-05-14: qty 10

## 2024-05-14 NOTE — ED Provider Notes (Addendum)
 " [place] EMERGENCY DEPARTMENT AT [facility] Provider Note   CSN: [id number] Arrival date & time: 05/14/24  1146     Patient presents with: Chest Pain   Kayla [name] is a 79 y.o. female.   HPI Patient presents with chest pain.  None currently, but over the past 3 days patient has had episodes of tight grabbing sensation in her sternal area, primarily when laying flat.  Patient has multiple medical problems including fibromyalgia, arthritis, but notes that this pain is different for her. No dyspnea, fever, chills.    Prior to Admission medications  Medication Sig Start Date End Date Taking? Authorizing Provider  acetaminophen  (TYLENOL ) 500 MG tablet Take 1,000 mg by mouth in the morning, at noon, in the evening, and at bedtime.    [provider]  amLODipine  (NORVASC ) 5 MG tablet TAKE 1 TABLET (5 MG TOTAL) BY MOUTH DAILY. 11/11/23   Lonni Slain, MD  Azelastine -Fluticasone  137-50 MCG/ACT SUSP Place 1 spray into the nose every 12 (twelve) hours. Patient not taking: Reported on 02/06/2024 07/10/23   Lowne Chase, Yvonne R, DO  Cholecalciferol (VITAMIN D -3 PO) Take 1 capsule by mouth daily.    [provider]  clotrimazole -betamethasone  (LOTRISONE ) cream APPLY TO AFFECTED AREA 2 TIMES DAILY AS NEEDED 03/22/24   Lowne Chase, Yvonne R, DO  [employer] VITAMIN B12 1000 MCG tablet Take 2,000 mcg by mouth daily.    [provider]  DULoxetine  (CYMBALTA ) 30 MG capsule Take 1 capsule (30 mg total) by mouth at bedtime. 09/17/23   Leigh Venetia CROME, MD  estradiol  (ESTRACE ) 0.1 MG/GM vaginal cream Apply a pea-sized amount of cream to your vaginal opening using your index finger 3 nights a week Patient not taking: Reported on 02/06/2024 09/03/23   Matilda Senior, MD  hydrALAZINE  (APRESOLINE ) 10 MG tablet TAKE 1 TABLET BY MOUTH 3 TIMES DAILY AS NEEDED (FOR ELEVATED BLOOD PRESSURE (TOP NUMBER MORE THAN 140, BOTTOM NUMBER MORE THAN 90) 04/15/24   Lonni Slain, MD   hydrOXYzine  (ATARAX ) 25 MG tablet Take 1 tablet (25 mg total) by mouth every 8 (eight) hours as needed for itching. 03/08/24   Franklyn Sid SAILOR, MD  levothyroxine  (SYNTHROID ) 137 MCG tablet Take 1 tablet (137 mcg total) by mouth daily before breakfast. 05/05/24   Antonio Meth, Yvonne R, DO  MAGNESIUM  GLYCINATE PO Take 1 tablet by mouth daily.    [provider]  methenamine  (HIPREX ) 1 g tablet Take 1 tablet (1 g total) by mouth 2 (two) times daily with a meal. 09/03/23   Matilda Senior, MD  methocarbamol  (ROBAXIN ) 500 MG tablet Take 1 tablet (500 mg total) by mouth 2 (two) times daily. 02/03/24   Antonio Meth Jamee JONELLE, DO  nitrofurantoin , macrocrystal-monohydrate, (MACROBID ) 100 MG capsule Take 1 capsule (100 mg total) by mouth 2 (two) times daily. 05/05/24   Lowne Chase, Yvonne R, DO  nystatin  cream (MYCOSTATIN ) Apply to affected area 2 times daily Patient taking differently: Apply 1 Application topically 2 (two) times daily as needed for dry skin (or irritation- affected areas). 04/27/23   Johnie Flaming A, NP  ondansetron  (ZOFRAN -ODT) 4 MG disintegrating tablet Take 1 tablet (4 mg total) by mouth every 8 (eight) hours as needed. 08/03/23   Griselda Norris, MD  oxyCODONE -acetaminophen  (PERCOCET/ROXICET) 5-325 MG tablet Take 1 tablet by mouth every 8 (eight) hours as needed for up to 5 doses for severe pain (pain score 7-10). 04/07/24   Rosina Norris LABOR, PA-C  pantoprazole  (PROTONIX ) 40 MG  tablet Take 1 tablet (40 mg total) by mouth daily. Patient not taking: Reported on 02/06/2024 07/10/23   Antonio Meth, Yvonne R, DO  Potassium 99 MG TABS Take 99 mg by mouth daily.    [provider]  potassium chloride  SA (KLOR-CON  M) 20 MEQ tablet Take 1 tablet (20 mEq total) by mouth daily. Patient not taking: Reported on 02/06/2024 08/18/23   Antonio Meth Jamee JONELLE, DO    Allergies: Ace inhibitors, Lisinopril, Ciprofloxacin , Fentanyl , and Iohexol     Review of Systems  Updated Vital  Signs BP (!) 157/104 (BP Location: Right Arm)   Pulse 88   Temp 98.2 F (36.8 C) (Oral)   Resp 18   SpO2 100%   Physical Exam Vitals and nursing note reviewed.  Constitutional:      General: She is not in acute distress.    Appearance: She is well-developed.  HENT:     Head: Normocephalic and atraumatic.  Eyes:     Conjunctiva/sclera: Conjunctivae normal.  Cardiovascular:     Rate and Rhythm: Normal rate and regular rhythm.  Pulmonary:     Effort: Pulmonary effort is normal. No respiratory distress.     Breath sounds: Normal breath sounds. No stridor.  Abdominal:     General: There is no distension.  Skin:    General: Skin is warm and dry.  Neurological:     Mental Status: She is alert and oriented to person, place, and time.     Cranial Nerves: No cranial nerve deficit.  Psychiatric:        Mood and Affect: Mood normal.     (all labs ordered are listed, but only abnormal results are displayed) Labs Reviewed  CBC - Abnormal; Notable for the following components:      Result Value   Hemoglobin 15.1 (*)    All other components within normal limits  LIPASE, BLOOD - Abnormal; Notable for the following components:   Lipase 62 (*)    All other components within normal limits  URINALYSIS, ROUTINE W REFLEX MICROSCOPIC - Abnormal; Notable for the following components:   Color, Urine STRAW (*)    Hgb urine dipstick SMALL (*)    Bacteria, UA RARE (*)    All other components within normal limits  BASIC METABOLIC PANEL WITH GFR  TROPONIN T, HIGH SENSITIVITY    EKG: EKG Interpretation Date/Time:  Friday May 14 2024 11:57:16 EST Ventricular Rate:  72 PR Interval:  163 QRS Duration:  106 QT Interval:  402 QTC Calculation: 440 R Axis:   15  Text Interpretation: Sinus rhythm RSR' in V1 or V2, right VCD or RVH Probable left ventricular hypertrophy Confirmed by Garrick Charleston [id number]) on 05/14/2024 12:07:55 PM  Radiology: DG Chest 2 View Result Date:  05/14/2024 CLINICAL DATA:  Chest pain x3 days. EXAM: CHEST - 2 VIEW COMPARISON:  November 15, 2023 FINDINGS: The cardiac silhouette is mildly enlarged. Mild, diffuse, chronic appearing increased interstitial lung markings are noted. No acute infiltrate, pleural effusion or pneumothorax is identified. Postoperative changes are seen within the lower cervical spine with multilevel degenerative changes present throughout the thoracic spine. IMPRESSION: Mild cardiomegaly with chronic appearing increased interstitial lung markings. Electronically Signed   By: Suzen Dials M.D.   On: 05/14/2024 13:14     Procedures   Medications Ordered in the ED  morphine  (PF) 4 MG/ML injection 4 mg (4 mg Intravenous Patient Refused/Not Given 05/14/24 1504)  famotidine  (PEPCID ) IVPB 20 mg premix (0 mg Intravenous Stopped 05/14/24 1429)  sucralfate  (CARAFATE ) 1 GM/10ML suspension 1 g (1 g Oral Given 05/14/24 1400)                                    Medical Decision Making Elderly female, multiple medical problems including fibromyalgia, hypertension, hyperlipidemia elevated risk profile for ACS presents with chest pain.  Concern for unstable angina versus gastroesophageal etiology versus pneumonia, this is less likely given absence of hypoxia, tachypnea. Initial vitals reassuring cardiac 90 sinus normal pulse ox 100% room air normal  Amount and/or Complexity of Data Reviewed External Data Reviewed: notes. Labs: ordered. Decision-making details documented in ED Course. Radiology: ordered and independent interpretation performed. Decision-making details documented in ED Course. ECG/medicine tests: ordered and independent interpretation performed. Decision-making details documented in ED Course.  Risk Prescription drug management. Decision regarding hospitalization. Diagnosis or treatment significantly limited by social determinants of health.   3:15 PM Patient with evidence for mild pancreatitis, troponin normal,  EKG nonischemic, given the passage of days with symptoms low suspicion for atypical ACS, patient also has no history of this.  With pain in her sternal area, epigastrium, patient will continue clear liquid diet.  Some suspicion for reflux given her superior pain as well, patient notes that she was previously discussed endoscopy with gastroenterology but after discussing with family members further she was advised not to have this done, for unclear reasons. Here no evidence for decompensated state, has been monitored for hours without arrhythmia, she has no evidence of bacteremia, sepsis, no abdominal pain laterally, little evidence for hepatobiliary dysfunction, acute cholecystitis, no evidence for perforation patient discharged in stable condition.     Final diagnoses:  Atypical chest pain  Idiopathic acute pancreatitis, unspecified complication status    ED Discharge Orders     None          Garrick Charleston, MD 05/14/24 1515    Garrick Charleston, MD 05/14/24 1518  "

## 2024-05-14 NOTE — Discharge Instructions (Signed)
 Today's evaluation has been generally reassuring.  There is some evidence for inflammation in your pancreas as well as concern for irritation of your esophagus.  Please take all medication as prescribed and for the next 2 days please drink only liquids.  You may then slowly return to a normal diet over the next few days.  Follow-up with your primary care physician and our gastroenterology colleagues.  Return here for concerning changes in your condition.

## 2024-05-14 NOTE — ED Triage Notes (Signed)
 Pt reports chest pain x 3 days that grabs her out of her sleep , radiates to neck and left arm, pt reports took med to help with pain but no relief. Pt does not remember name of med. Pt reports mild anxiety
